# Patient Record
Sex: Male | Born: 1940 | Race: White | Hispanic: No | Marital: Single | Smoking: Former smoker
Health system: Southern US, Community
[De-identification: ages and names within clinical notes are randomized; demographics above are authoritative.]

## PROBLEM LIST (undated history)

## (undated) DIAGNOSIS — R627 Adult failure to thrive: Secondary | ICD-10-CM

## (undated) DIAGNOSIS — I2510 Atherosclerotic heart disease of native coronary artery without angina pectoris: Secondary | ICD-10-CM

## (undated) DIAGNOSIS — F32A Depression, unspecified: Secondary | ICD-10-CM

## (undated) DIAGNOSIS — I739 Peripheral vascular disease, unspecified: Secondary | ICD-10-CM

## (undated) DIAGNOSIS — N183 Chronic kidney disease, stage 3 unspecified: Secondary | ICD-10-CM

## (undated) DIAGNOSIS — M109 Gout, unspecified: Secondary | ICD-10-CM

## (undated) DIAGNOSIS — I70229 Atherosclerosis of native arteries of extremities with rest pain, unspecified extremity: Secondary | ICD-10-CM

## (undated) DIAGNOSIS — M1990 Unspecified osteoarthritis, unspecified site: Secondary | ICD-10-CM

## (undated) DIAGNOSIS — I5043 Acute on chronic combined systolic (congestive) and diastolic (congestive) heart failure: Secondary | ICD-10-CM

## (undated) DIAGNOSIS — N12 Tubulo-interstitial nephritis, not specified as acute or chronic: Secondary | ICD-10-CM

## (undated) DIAGNOSIS — I998 Other disorder of circulatory system: Secondary | ICD-10-CM

## (undated) DIAGNOSIS — E039 Hypothyroidism, unspecified: Secondary | ICD-10-CM

## (undated) DIAGNOSIS — F329 Major depressive disorder, single episode, unspecified: Secondary | ICD-10-CM

## (undated) DIAGNOSIS — Z9889 Other specified postprocedural states: Secondary | ICD-10-CM

## (undated) DIAGNOSIS — R5381 Other malaise: Secondary | ICD-10-CM

## (undated) DIAGNOSIS — D649 Anemia, unspecified: Secondary | ICD-10-CM

## (undated) DIAGNOSIS — J449 Chronic obstructive pulmonary disease, unspecified: Secondary | ICD-10-CM

## (undated) DIAGNOSIS — T8781 Dehiscence of amputation stump: Secondary | ICD-10-CM

## (undated) HISTORY — PX: KNEE CARTILAGE SURGERY: SHX688

## (undated) HISTORY — PX: REFRACTIVE SURGERY: SHX103

## (undated) HISTORY — DX: Atherosclerosis of native arteries of extremities with rest pain, unspecified extremity: I70.229

## (undated) HISTORY — PX: INGUINAL HERNIA REPAIR: SUR1180

## (undated) HISTORY — PX: KNEE LIGAMENT RECONSTRUCTION: SHX1895

## (undated) HISTORY — DX: Other disorder of circulatory system: I99.8

## (undated) HISTORY — DX: Atherosclerotic heart disease of native coronary artery without angina pectoris: I25.10

## (undated) HISTORY — PX: CATARACT EXTRACTION W/ INTRAOCULAR LENS  IMPLANT, BILATERAL: SHX1307

## (undated) HISTORY — DX: Other specified postprocedural states: Z98.890

---

## 2012-05-14 ENCOUNTER — Emergency Department (HOSPITAL_COMMUNITY): Payer: Medicare Other

## 2012-05-14 ENCOUNTER — Emergency Department (HOSPITAL_COMMUNITY)
Admission: EM | Admit: 2012-05-14 | Discharge: 2012-05-14 | Disposition: A | Discharge status: Home / Self Care | Payer: Medicare Other | Attending: Emergency Medicine | Admitting: Emergency Medicine

## 2012-05-14 ENCOUNTER — Encounter (HOSPITAL_COMMUNITY): Payer: Self-pay

## 2012-05-14 DIAGNOSIS — M25473 Effusion, unspecified ankle: Secondary | ICD-10-CM | POA: Insufficient documentation

## 2012-05-14 DIAGNOSIS — M25476 Effusion, unspecified foot: Secondary | ICD-10-CM | POA: Insufficient documentation

## 2012-05-14 DIAGNOSIS — M1712 Unilateral primary osteoarthritis, left knee: Secondary | ICD-10-CM

## 2012-05-14 DIAGNOSIS — M109 Gout, unspecified: Secondary | ICD-10-CM | POA: Insufficient documentation

## 2012-05-14 DIAGNOSIS — R350 Frequency of micturition: Secondary | ICD-10-CM | POA: Insufficient documentation

## 2012-05-14 DIAGNOSIS — F17200 Nicotine dependence, unspecified, uncomplicated: Secondary | ICD-10-CM | POA: Insufficient documentation

## 2012-05-14 DIAGNOSIS — M10061 Idiopathic gout, right knee: Secondary | ICD-10-CM

## 2012-05-14 HISTORY — DX: Gout, unspecified: M10.9

## 2012-05-14 LAB — SYNOVIAL CELL COUNT + DIFF, W/ CRYSTALS
Eosinophils-Synovial: 0 % (ref 0–1)
Neutrophil, Synovial: 97 % — ABNORMAL HIGH (ref 0–25)

## 2012-05-14 LAB — BASIC METABOLIC PANEL
BUN: 20 mg/dL (ref 6–23)
CO2: 25 mEq/L (ref 19–32)
Chloride: 98 mEq/L (ref 96–112)
Glucose, Bld: 112 mg/dL — ABNORMAL HIGH (ref 70–99)
Potassium: 4.3 mEq/L (ref 3.5–5.1)
Sodium: 135 mEq/L (ref 135–145)

## 2012-05-14 LAB — CBC WITH DIFFERENTIAL/PLATELET
Hemoglobin: 11.2 g/dL — ABNORMAL LOW (ref 13.0–17.0)
Lymphocytes Relative: 9 % — ABNORMAL LOW (ref 12–46)
Lymphs Abs: 0.7 10*3/uL (ref 0.7–4.0)
MCH: 26.6 pg (ref 26.0–34.0)
Monocytes Relative: 14 % — ABNORMAL HIGH (ref 3–12)
Neutrophils Relative %: 76 % (ref 43–77)
Platelets: 350 10*3/uL (ref 150–400)
RBC: 4.21 MIL/uL — ABNORMAL LOW (ref 4.22–5.81)
WBC: 7.3 10*3/uL (ref 4.0–10.5)

## 2012-05-14 LAB — HEPATIC FUNCTION PANEL
Albumin: 3.5 g/dL (ref 3.5–5.2)
Alkaline Phosphatase: 108 U/L (ref 39–117)
Bilirubin, Direct: 0.6 mg/dL — ABNORMAL HIGH (ref 0.0–0.3)
Total Bilirubin: 1 mg/dL (ref 0.3–1.2)

## 2012-05-14 LAB — URINALYSIS, ROUTINE W REFLEX MICROSCOPIC
Glucose, UA: NEGATIVE mg/dL
Hgb urine dipstick: NEGATIVE
Specific Gravity, Urine: 1.029 (ref 1.005–1.030)
Urobilinogen, UA: >8 mg/dL — ABNORMAL HIGH (ref 0.0–1.0)
pH: 5.5 (ref 5.0–8.0)

## 2012-05-14 LAB — URINE MICROSCOPIC-ADD ON

## 2012-05-14 MED ORDER — HYDROMORPHONE HCL PF 1 MG/ML IJ SOLN
1.0000 mg | INTRAMUSCULAR | Status: AC | PRN
  Administered 2012-05-14 (×2): 1 mg via INTRAVENOUS
  Filled 2012-05-14 (×2): qty 1

## 2012-05-14 MED ORDER — HYDROCODONE-ACETAMINOPHEN 5-325 MG PO TABS: 1.0000 | ORAL_TABLET | Freq: Four times a day (QID) | ORAL | Status: DC | PRN

## 2012-05-14 MED ORDER — OXYCODONE-ACETAMINOPHEN 5-325 MG PO TABS
1.0000 | ORAL_TABLET | Freq: Once | ORAL | Status: AC
  Administered 2012-05-14: 1 via ORAL
  Filled 2012-05-14: qty 1

## 2012-05-14 MED ORDER — INDOMETHACIN 25 MG PO CAPS: 25.0000 mg | ORAL_CAPSULE | Freq: Three times a day (TID) | ORAL | Status: DC | PRN

## 2012-05-14 NOTE — ED Provider Notes (Signed)
Apiration of blood/fluid Date/Time: 05/14/2012 7:11 PM Performed by: Jaci Carrel Authorized by: Jaci Carrel Consent: Verbal consent obtained. Risks and benefits: risks, benefits and alternatives were discussed Consent given by: patient Patient understanding: patient states understanding of the procedure being performed Patient consent: the patient's understanding of the procedure matches consent given Patient identity confirmed: verbally with patient and arm band Time out: Immediately prior to procedure a "time out" was called to verify the correct patient, procedure, equipment, support staff and site/side marked as required. Preparation: Patient was prepped and draped in the usual sterile fashion. Local anesthesia used: yes Local anesthetic: lidocaine 2% without epinephrine Anesthetic total: 2 ml Patient sedated: no Patient tolerance: Patient tolerated the procedure well with no immediate complications. Comments: ~60 cc aspirated from right knee, color- dark straw. Culture sent.    Jaci Carrel, [place] 05/14/12 1918

## 2012-05-14 NOTE — ED Provider Notes (Signed)
History     CSN: [id number] Arrival date & time 05/14/12  1351 First MD Initiated Contact with Patient 05/14/12 1609     Chief Complaint  Patient presents with  . Knee Pain  . Joint Swelling   Patient is a 71 y.o. male presenting with knee pain. The history is provided by the patient.  Knee Pain This is a recurrent problem. The current episode started more than 2 days ago. The problem occurs constantly. The problem has been gradually worsening. Pertinent negatives include no chest pain, no abdominal pain, no headaches and no shortness of breath. Exacerbated by: acitivity. Nothing relieves the symptoms. Treatments tried: pt took a couple of his gout pills but it did not help. The treatment provided no relief.  He has history of gout but it has not affected his knees before.  Pt states he has not been able to walk now because of the pain.  He has not had any swelling.  No fever.  No injury.  He does have a history of PVD and continues to smoke but the pain is focused in his knees. Patient denies any trouble with chest pain or shortness of breath. He has not noticed any rashes.  Past Medical History  Diagnosis Date  . Gout     Past Surgical History  Procedure Date  . Knee surgery     Family History  Problem Relation Age of Onset  . Heart failure Father   . Heart failure Brother     History  Substance Use Topics  . Smoking status: Current Every Day Smoker -- 1.0 packs/day    Types: Cigarettes  . Smokeless tobacco: Never Used  . Alcohol Use: Yes     8- 12 ounce beers daily      Review of Systems  Constitutional: Negative for fever and fatigue.  Respiratory: Negative for cough and shortness of breath.   Cardiovascular: Negative for chest pain.  Gastrointestinal: Negative for abdominal pain.  Genitourinary: Positive for urgency.       Dark color to urine   Musculoskeletal: Positive for joint swelling.  Skin: Negative for rash.  Neurological: Negative for headaches.  All  other systems reviewed and are negative.    Allergies  Review of patient's allergies indicates no known allergies.  Home Medications  No current outpatient prescriptions on file.  BP 111/95  Pulse 95  Temp 97.7 F (36.5 C) (Oral)  Resp 18  SpO2 98%  Physical Exam  Nursing note and vitals reviewed. Constitutional: He appears well-developed and well-nourished. No distress.  HENT:  Head: Normocephalic and atraumatic.  Right Ear: External ear normal.  Left Ear: External ear normal.  Eyes: Conjunctivae normal are normal. Right eye exhibits no discharge. Left eye exhibits no discharge. No scleral icterus.  Neck: Neck supple. No tracheal deviation present.  Cardiovascular: Normal rate, regular rhythm and intact distal pulses.   Pulmonary/Chest: Effort normal and breath sounds normal. No stridor. No respiratory distress. He has no wheezes. He has no rales.  Abdominal: Soft. Bowel sounds are normal. He exhibits no distension. There is no tenderness. There is no rebound and no guarding.  Musculoskeletal: He exhibits tenderness. He exhibits no edema.       Right knee: He exhibits swelling and effusion. tenderness found.       Left knee: He exhibits swelling. tenderness found.       Right ankle: no tenderness.       Left ankle: no tenderness.  Right lower leg: He exhibits no tenderness.       Left lower leg: He exhibits no tenderness.       No erythema of his lower extremities, palpable distal pulses  Neurological: He is alert. He has normal strength. No sensory deficit. Cranial nerve deficit:  no gross defecits noted. He exhibits normal muscle tone. He displays no seizure activity. Coordination normal.  Skin: Skin is warm and dry. No rash noted.  Psychiatric: He has a normal mood and affect.    ED Course  Procedures (including critical care time)  Labs Reviewed  CBC WITH DIFFERENTIAL - Abnormal; Notable for the following:    RBC 4.21 (*)     Hemoglobin 11.2 (*)     HCT 33.6  (*)     RDW 16.6 (*)     Lymphocytes Relative 9 (*)     Monocytes Relative 14 (*)     All other components within normal limits  BASIC METABOLIC PANEL - Abnormal; Notable for the following:    Glucose, Bld 112 (*)     GFR calc non Af Amer 82 (*)     All other components within normal limits  URINALYSIS, ROUTINE W REFLEX MICROSCOPIC - Abnormal; Notable for the following:    Color, Urine ORANGE (*)  BIOCHEMICALS MAY BE AFFECTED BY COLOR   Bilirubin Urine MODERATE (*)     Ketones, ur TRACE (*)     Protein, ur 30 (*)     Urobilinogen, UA >8.0 (*)     Nitrite POSITIVE (*)     Leukocytes, UA SMALL (*)     All other components within normal limits  CELL COUNT + DIFF,  W/ CRYST-SYNVL FLD - Abnormal; Notable for the following:    Color, Synovial ORANGE (*)     Appearance-Synovial TURBID (*)     WBC, Synovial 16109 (*)     Neutrophil, Synovial 97 (*)     Monocyte-Macrophage-Synovial Fluid 2 (*)     All other components within normal limits  HEPATIC FUNCTION PANEL - Abnormal; Notable for the following:    Bilirubin, Direct 0.6 (*)     All other components within normal limits  URINE MICROSCOPIC-ADD ON  BODY FLUID CULTURE   Dg Knee 2 Views Left  05/14/2012  *RADIOLOGY REPORT*  Clinical Data: Bilateral knee pain for 4 days.  No known injury.  LEFT KNEE - 1-2 VIEW  Comparison: None.  Findings: There is mild osteopenia.  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  There are probable loose bodies posteriorly.  There is a moderate sized joint effusion.  No acute fracture or dislocation is seen.  Scattered vascular calcifications are noted.  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  No acute osseous findings identified.   Original Report Authenticated By: Gerrianne Scale, M.D.    Dg Knee 2 Views Right  05/14/2012  *RADIOLOGY REPORT*  Clinical Data: Bilateral knee pain for 4  days.  No known injury.  RIGHT KNEE - 1-2 VIEW  Comparison: None.  Findings: The joint spaces appear adequately maintained within the right knee.  There is a moderate sized knee joint effusion.  No acute fracture or dislocation is seen.  There are scattered vascular calcifications.  IMPRESSION: No significant right knee osteoarthritis.  Moderate sized knee joint effusion.   Original Report Authenticated By: Gerrianne Scale, M.D.      MDM  ED course: Patient was provided pain  medications. Joint aspiration was performed on his right knee for symptom improvement and analysis of the synovial fluid. Findings were discussed with patient as well as his family member  Medical decision-making: The patient's knee problems associated with gouty arthritis as well as osteoarthritis. We'll discharge him home on medications for pain and inflammation. Unfortunately there is no indication for admission to the hospital at this time. Regarding his dark urine this appears to be related to bilirubin in the urine. There doesn't appear to be evidence of infection.  I did explain to the patient that his alcohol use is likely related to this he should cut back on history taking. Recommend he followup with a primary care doctor. I will provide him with outpatient resources         Celene Kras, MD 05/14/12 2123

## 2012-05-14 NOTE — ED Provider Notes (Signed)
Medical screening examination/treatment/procedure(s) were conducted as a shared visit with non-physician practitioner(s) and myself.  I personally evaluated the patient during the encounter   Celene Kras, MD 05/14/12 1944

## 2012-05-14 NOTE — ED Notes (Signed)
Patient reports that he began having bilateral knee and feet swelling x 5 days, Patient has a history of gout.

## 2012-05-18 LAB — BODY FLUID CULTURE: Special Requests: NORMAL

## 2014-02-25 ENCOUNTER — Other Ambulatory Visit (HOSPITAL_COMMUNITY): Payer: Self-pay | Admitting: Orthopedic Surgery

## 2014-02-28 ENCOUNTER — Encounter (HOSPITAL_COMMUNITY): Payer: Self-pay | Admitting: *Deleted

## 2014-02-28 MED ORDER — CEFAZOLIN SODIUM-DEXTROSE 2-3 GM-% IV SOLR
2.0000 g | INTRAVENOUS | Status: AC
  Administered 2014-03-01: 2 g via INTRAVENOUS
  Filled 2014-02-28: qty 50

## 2014-03-01 ENCOUNTER — Ambulatory Visit (HOSPITAL_COMMUNITY): Payer: Medicare PPO

## 2014-03-01 ENCOUNTER — Ambulatory Visit (HOSPITAL_COMMUNITY): Payer: Medicare PPO | Admitting: Anesthesiology

## 2014-03-01 ENCOUNTER — Encounter (HOSPITAL_COMMUNITY): Payer: Medicare PPO | Admitting: Anesthesiology

## 2014-03-01 ENCOUNTER — Ambulatory Visit (HOSPITAL_COMMUNITY)
Admission: RE | Admit: 2014-03-01 | Discharge: 2014-03-01 | Disposition: A | Discharge status: Home / Self Care | Payer: Medicare PPO | Source: Ambulatory Visit | Attending: Orthopedic Surgery | Admitting: Orthopedic Surgery

## 2014-03-01 ENCOUNTER — Encounter (HOSPITAL_COMMUNITY)
Admission: RE | Disposition: A | Discharge status: Home / Self Care | Payer: Self-pay | Source: Ambulatory Visit | Attending: Orthopedic Surgery

## 2014-03-01 ENCOUNTER — Encounter (HOSPITAL_COMMUNITY): Payer: Self-pay | Admitting: *Deleted

## 2014-03-01 DIAGNOSIS — L97509 Non-pressure chronic ulcer of other part of unspecified foot with unspecified severity: Secondary | ICD-10-CM | POA: Diagnosis not present

## 2014-03-01 DIAGNOSIS — F17200 Nicotine dependence, unspecified, uncomplicated: Secondary | ICD-10-CM | POA: Diagnosis not present

## 2014-03-01 DIAGNOSIS — M869 Osteomyelitis, unspecified: Secondary | ICD-10-CM | POA: Diagnosis present

## 2014-03-01 DIAGNOSIS — D649 Anemia, unspecified: Secondary | ICD-10-CM | POA: Insufficient documentation

## 2014-03-01 DIAGNOSIS — B351 Tinea unguium: Secondary | ICD-10-CM | POA: Diagnosis not present

## 2014-03-01 HISTORY — DX: Chronic obstructive pulmonary disease, unspecified: J44.9

## 2014-03-01 HISTORY — PX: AMPUTATION: SHX166

## 2014-03-01 LAB — PROTIME-INR
INR: 1.21 (ref 0.00–1.49)
Prothrombin Time: 15.3 seconds — ABNORMAL HIGH (ref 11.6–15.2)

## 2014-03-01 LAB — COMPREHENSIVE METABOLIC PANEL
ALK PHOS: 68 U/L (ref 39–117)
ALT: <5 U/L (ref 0–53)
ANION GAP: 12 (ref 5–15)
AST: 10 U/L (ref 0–37)
Albumin: 3.4 g/dL — ABNORMAL LOW (ref 3.5–5.2)
BUN: 11 mg/dL (ref 6–23)
CALCIUM: 9.2 mg/dL (ref 8.4–10.5)
CO2: 24 mEq/L (ref 19–32)
Chloride: 103 mEq/L (ref 96–112)
Creatinine, Ser: 0.98 mg/dL (ref 0.50–1.35)
GFR calc Af Amer: >90 mL/min (ref >90)
GFR calc non Af Amer: 81 mL/min — ABNORMAL LOW (ref >90)
Glucose, Bld: 92 mg/dL (ref 70–99)
Potassium: 4.3 mEq/L (ref 3.7–5.3)
SODIUM: 139 meq/L (ref 137–147)
TOTAL PROTEIN: 6.3 g/dL (ref 6.0–8.3)
Total Bilirubin: 0.5 mg/dL (ref 0.3–1.2)

## 2014-03-01 LAB — CBC
HEMATOCRIT: 33 % — AB (ref 39.0–52.0)
Hemoglobin: 10.2 g/dL — ABNORMAL LOW (ref 13.0–17.0)
MCH: 22.6 pg — ABNORMAL LOW (ref 26.0–34.0)
MCHC: 30.9 g/dL (ref 30.0–36.0)
MCV: 73.2 fL — ABNORMAL LOW (ref 78.0–100.0)
Platelets: 231 10*3/uL (ref 150–400)
RBC: 4.51 MIL/uL (ref 4.22–5.81)
RDW: 19.2 % — AB (ref 11.5–15.5)
WBC: 5 10*3/uL (ref 4.0–10.5)

## 2014-03-01 LAB — APTT: aPTT: 43 seconds — ABNORMAL HIGH (ref 24–37)

## 2014-03-01 SURGERY — AMPUTATION, FOOT, RAY
Anesthesia: General | Site: Foot | Laterality: Right

## 2014-03-01 MED ORDER — PROPOFOL 10 MG/ML IV BOLUS
INTRAVENOUS | Status: AC
  Filled 2014-03-01: qty 20

## 2014-03-01 MED ORDER — PROMETHAZINE HCL 25 MG/ML IJ SOLN: 6.2500 mg | INTRAMUSCULAR | Status: DC | PRN

## 2014-03-01 MED ORDER — OXYCODONE HCL 5 MG/5ML PO SOLN: 5.0000 mg | Freq: Once | ORAL | Status: AC | PRN

## 2014-03-01 MED ORDER — FENTANYL CITRATE 0.05 MG/ML IJ SOLN
25.0000 ug | INTRAMUSCULAR | Status: DC | PRN
  Administered 2014-03-01 (×2): 50 ug via INTRAVENOUS

## 2014-03-01 MED ORDER — FENTANYL CITRATE 0.05 MG/ML IJ SOLN
INTRAMUSCULAR | Status: AC
  Filled 2014-03-01: qty 5

## 2014-03-01 MED ORDER — PROPOFOL 10 MG/ML IV BOLUS
INTRAVENOUS | Status: DC | PRN
  Administered 2014-03-01: 160 mg via INTRAVENOUS

## 2014-03-01 MED ORDER — LACTATED RINGERS IV SOLN
INTRAVENOUS | Status: DC | PRN
  Administered 2014-03-01: 12:00:00 via INTRAVENOUS

## 2014-03-01 MED ORDER — LACTATED RINGERS IV SOLN
INTRAVENOUS | Status: DC
  Administered 2014-03-01: 12:00:00 via INTRAVENOUS

## 2014-03-01 MED ORDER — OXYCODONE HCL 5 MG PO TABS
5.0000 mg | ORAL_TABLET | Freq: Once | ORAL | Status: AC | PRN
  Administered 2014-03-01: 5 mg via ORAL

## 2014-03-01 MED ORDER — FENTANYL CITRATE 0.05 MG/ML IJ SOLN
INTRAMUSCULAR | Status: DC | PRN
  Administered 2014-03-01: 100 ug via INTRAVENOUS

## 2014-03-01 MED ORDER — HYDROCODONE-ACETAMINOPHEN 5-325 MG PO TABS: 1.0000 | ORAL_TABLET | Freq: Four times a day (QID) | ORAL | Status: DC | PRN

## 2014-03-01 MED ORDER — 0.9 % SODIUM CHLORIDE (POUR BTL) OPTIME
TOPICAL | Status: DC | PRN
  Administered 2014-03-01: 1000 mL

## 2014-03-01 MED ORDER — HYDROCODONE-ACETAMINOPHEN 5-325 MG PO TABS
ORAL_TABLET | ORAL | Status: AC
  Filled 2014-03-01: qty 1

## 2014-03-01 MED ORDER — LIDOCAINE HCL (CARDIAC) 20 MG/ML IV SOLN
INTRAVENOUS | Status: DC | PRN
  Administered 2014-03-01: 75 mg via INTRAVENOUS

## 2014-03-01 MED ORDER — LIDOCAINE HCL (CARDIAC) 20 MG/ML IV SOLN
INTRAVENOUS | Status: AC
  Filled 2014-03-01: qty 5

## 2014-03-01 MED ORDER — FENTANYL CITRATE 0.05 MG/ML IJ SOLN
INTRAMUSCULAR | Status: AC
  Filled 2014-03-01: qty 2

## 2014-03-01 MED ORDER — HYDROCODONE-ACETAMINOPHEN 5-325 MG PO TABS
1.0000 | ORAL_TABLET | Freq: Once | ORAL | Status: AC
  Administered 2014-03-01: 1 via ORAL

## 2014-03-01 MED ORDER — OXYCODONE HCL 5 MG PO TABS
ORAL_TABLET | ORAL | Status: AC
  Filled 2014-03-01: qty 1

## 2014-03-01 SURGICAL SUPPLY — 37 items
BLADE SAW SGTL MED 73X18.5 STR (BLADE) IMPLANT
BNDG COHESIVE 4X5 TAN STRL (GAUZE/BANDAGES/DRESSINGS) ×3 IMPLANT
BNDG GAUZE ELAST 4 BULKY (GAUZE/BANDAGES/DRESSINGS) ×3 IMPLANT
COVER SURGICAL LIGHT HANDLE (MISCELLANEOUS) ×3 IMPLANT
DRAPE U-SHAPE 47X51 STRL (DRAPES) ×6 IMPLANT
DRSG ADAPTIC 3X8 NADH LF (GAUZE/BANDAGES/DRESSINGS) ×3 IMPLANT
DRSG PAD ABDOMINAL 8X10 ST (GAUZE/BANDAGES/DRESSINGS) ×6 IMPLANT
DURAPREP 26ML APPLICATOR (WOUND CARE) ×3 IMPLANT
ELECT REM PT RETURN 9FT ADLT (ELECTROSURGICAL) ×3
ELECTRODE REM PT RTRN 9FT ADLT (ELECTROSURGICAL) ×1 IMPLANT
GAUZE SPONGE 4X4 12PLY STRL (GAUZE/BANDAGES/DRESSINGS) ×3 IMPLANT
GLOVE BIO SURGEON STRL SZ 6 (GLOVE) ×3 IMPLANT
GLOVE BIO SURGEON STRL SZ 6.5 (GLOVE) ×2 IMPLANT
GLOVE BIO SURGEONS STRL SZ 6.5 (GLOVE) ×1
GLOVE BIOGEL PI IND STRL 6.5 (GLOVE) ×3 IMPLANT
GLOVE BIOGEL PI IND STRL 9 (GLOVE) ×1 IMPLANT
GLOVE BIOGEL PI INDICATOR 6.5 (GLOVE) ×6
GLOVE BIOGEL PI INDICATOR 9 (GLOVE) ×2
GLOVE SKINSENSE NS SZ7.0 (GLOVE) ×2
GLOVE SKINSENSE STRL SZ7.0 (GLOVE) ×1 IMPLANT
GLOVE SURG ORTHO 9.0 STRL STRW (GLOVE) ×3 IMPLANT
GOWN STRL REUS W/ TWL XL LVL3 (GOWN DISPOSABLE) ×3 IMPLANT
GOWN STRL REUS W/TWL XL LVL3 (GOWN DISPOSABLE) ×6
KIT BASIN OR (CUSTOM PROCEDURE TRAY) ×3 IMPLANT
KIT ROOM TURNOVER OR (KITS) ×3 IMPLANT
NS IRRIG 1000ML POUR BTL (IV SOLUTION) ×3 IMPLANT
PACK ORTHO EXTREMITY (CUSTOM PROCEDURE TRAY) ×3 IMPLANT
PAD ABD 8X10 STRL (GAUZE/BANDAGES/DRESSINGS) ×3 IMPLANT
PAD ARMBOARD 7.5X6 YLW CONV (MISCELLANEOUS) ×6 IMPLANT
SPONGE GAUZE 4X4 12PLY STER LF (GAUZE/BANDAGES/DRESSINGS) ×3 IMPLANT
SPONGE LAP 18X18 X RAY DECT (DISPOSABLE) ×3 IMPLANT
STOCKINETTE IMPERVIOUS LG (DRAPES) ×3 IMPLANT
SUT ETHILON 2 0 PSLX (SUTURE) ×6 IMPLANT
TOWEL OR 17X24 6PK STRL BLUE (TOWEL DISPOSABLE) ×3 IMPLANT
TOWEL OR 17X26 10 PK STRL BLUE (TOWEL DISPOSABLE) ×3 IMPLANT
UNDERPAD 30X30 INCONTINENT (UNDERPADS AND DIAPERS) ×3 IMPLANT
WATER STERILE IRR 1000ML POUR (IV SOLUTION) ×3 IMPLANT

## 2014-03-01 NOTE — Anesthesia Procedure Notes (Signed)
Procedure Name: LMA Insertion Date/Time: 03/01/2014 12:49 PM Performed by: Gwenyth AllegraADAMI, Milon Dethloff Pre-anesthesia Checklist: Patient identified, Patient being monitored, Emergency Drugs available and Timeout performed Patient Re-evaluated:Patient Re-evaluated prior to inductionPreoxygenation: Pre-oxygenation with 100% oxygen Intubation Type: IV induction Ventilation: Mask ventilation without difficulty LMA: LMA inserted LMA Size: 5.0 Number of attempts: 1 Placement Confirmation: positive ETCO2 and breath sounds checked- equal and bilateral Tube secured with: Tape Dental Injury: Teeth and Oropharynx as per pre-operative assessment

## 2014-03-01 NOTE — Anesthesia Preprocedure Evaluation (Addendum)
Anesthesia Evaluation  Patient identified by MRN, date of birth, ID band  Reviewed: Allergy & Precautions, H&P , NPO status , Patient's Chart, lab work & pertinent test results  Airway Mallampati: II  Neck ROM: Full    Dental  (+) Missing, Poor Dentition   Pulmonary Current Smoker,  breath sounds clear to auscultation        Cardiovascular + Peripheral Vascular Disease Rhythm:Regular Rate:Normal     Neuro/Psych negative neurological ROS     GI/Hepatic negative GI ROS, Neg liver ROS,   Endo/Other  negative endocrine ROS  Renal/GU      Musculoskeletal   Abdominal   Peds  Hematology  (+) anemia ,   Anesthesia Other Findings   Reproductive/Obstetrics                          Anesthesia Physical Anesthesia Plan  ASA: II  Anesthesia Plan: General   Post-op Pain Management:    Induction: Intravenous  Airway Management Planned: LMA  Additional Equipment:   Intra-op Plan:   Post-operative Plan: Extubation in OR  Informed Consent: I have reviewed the patients History and Physical, chart, labs and discussed the procedure including the risks, benefits and alternatives for the proposed anesthesia with the patient or authorized representative who has indicated his/her understanding and acceptance.   Dental advisory given  Plan Discussed with: CRNA and Surgeon  Anesthesia Plan Comments:         Anesthesia Quick Evaluation

## 2014-03-01 NOTE — H&P (Signed)
Caleb Hays is an 73 y.o. male.   Chief Complaint: Osteomyelitis ulceration right foot fifth metatarsal head HPI: Patient is a 73 year old gentleman who presents after failure of conservative treatment for ulceration to the fifth metatarsal head right foot. 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  Past Medical History  Diagnosis Date  . Gout     Past Surgical History  Procedure Laterality Date  . Knee surgery Left     football injury    Family History  Problem Relation Age of Onset  . Heart failure Father   . Heart failure Brother    Social History:  reports that he has been smoking Cigarettes.  He has a 12.5 pack-year smoking history. He has never used smokeless tobacco. He reports that he does not drink alcohol or use illicit drugs.  Allergies: No Known Allergies  No prescriptions prior to admission    No results found for this or any previous visit (from the past 48 hour(s)). No results found.  Review of Systems  All other systems reviewed and are negative.   Height 6\' 4"  (1.93 m), weight 83.462 kg (184 lb). Physical Exam  On examination patient has a palpable dorsalis pedis pulse. He has ulceration of the fifth metatarsal head right foot. Radiograph shows destructive bony changes of the fifth metatarsal head. Assessment/Plan Assessment: Osteomyelitis ulceration right foot fifth metatarsal head.  Plan: Will plan for fifth ray amputation. Risks and benefits were discussed including risk of no healing of the wound. Patient states he understands and wished to proceed at this time.  DUDA,MARCUS V 03/01/2014, 6:14 AM

## 2014-03-01 NOTE — Transfer of Care (Signed)
Immediate Anesthesia Transfer of Care Note  Patient: Caleb Hays  Procedure(s) Performed: Procedure(s) with comments: AMPUTATION RAY (Right) - Right Foot 5th Ray Amputation  Patient Location: PACU  Anesthesia Type:General  Level of Consciousness: awake, alert  and oriented  Airway & Oxygen Therapy: Patient Spontanous Breathing and Patient connected to nasal cannula oxygen  Post-op Assessment: Report given to PACU RN and Post -op Vital signs reviewed and stable  Post vital signs: Reviewed and stable  Complications: No apparent anesthesia complications

## 2014-03-01 NOTE — Op Note (Signed)
03/01/2014  1:07 PM  PATIENT:  [name]    PRE-OPERATIVE DIAGNOSIS:  Osteomyelitis Right 5th Toe  POST-OPERATIVE DIAGNOSIS:  Same  PROCEDURE:  AMPUTATION RAY  SURGEON:  Alim Cattell V, MD  PHYSICIAN ASSISTANT:None ANESTHESIA:   General  PREOPERATIVE INDICATIONS:  Caleb SavageJerald Hays is a  73 y.o. male with a diagnosis of Osteomyelitis Right 5th Toe who failed conservative measures and elected for surgical management.    The risks benefits and alternatives were discussed with the patient preoperatively including but not limited to the risks of infection, bleeding, nerve injury, cardiopulmonary complications, the need for revision surgery, among others, and the patient was willing to proceed.  OPERATIVE IMPLANTS: none  OPERATIVE FINDINGS: Good petechial bleeding  OPERATIVE PROCEDURE: Patient was brought to the operating room and underwent a general anesthetic. After adequate levels of anesthesia were obtained patient's right lower extremity was prepped using DuraPrep draped into a sterile field. A racquet incision was made around the ulcerative toe and fifth ray. The fifth metatarsal was resected through its mid shaft. The wound was irrigated with normal saline. The skin was closed using 2-0 nylon. Patient thickened discolored onychomycotic nails x5 and these were trimmed x5 without difficulty. A sterile compressive dressing was applied patient was extubated taken to the PACU in stable condition.

## 2014-03-01 NOTE — Progress Notes (Signed)
Orthopedic Tech Progress Note Patient Details:  Caleb SavageJerald Hays 07-22-1940 [id number]  Ortho Devices Type of Ortho Device: Postop shoe/boot Ortho Device/Splint Location: RLE Ortho Device/Splint Interventions: Ordered;Application   [name], [name] 03/01/2014, 1:46 PM

## 2014-03-04 ENCOUNTER — Telehealth: Payer: Self-pay | Admitting: Cardiovascular Disease

## 2014-03-04 ENCOUNTER — Encounter (HOSPITAL_COMMUNITY): Payer: Self-pay | Admitting: Orthopedic Surgery

## 2014-03-05 NOTE — Anesthesia Postprocedure Evaluation (Signed)
  Anesthesia Post-op Note  Patient: [name]  Procedure(s) Performed: Procedure(s) with comments: AMPUTATION RAY (Right) - Right Foot 5th Ray Amputation  Patient Location: PACU  Anesthesia Type:General  Level of Consciousness: awake and alert   Airway and Oxygen Therapy: Patient Spontanous Breathing  Post-op Pain: none  Post-op Assessment: Post-op Vital signs reviewed  Post-op Vital Signs: stable  Last Vitals:  Filed Vitals:   03/01/14 1407  BP: 144/63  Pulse: 62  Temp: 36.7 C  Resp: 16    Complications: No apparent anesthesia complications

## 2014-03-08 NOTE — Telephone Encounter (Signed)
Closed encounter °

## 2014-03-12 ENCOUNTER — Ambulatory Visit (INDEPENDENT_AMBULATORY_CARE_PROVIDER_SITE_OTHER): Payer: Medicare PPO | Admitting: Cardiovascular Disease

## 2014-03-12 ENCOUNTER — Encounter: Payer: Self-pay | Admitting: Cardiovascular Disease

## 2014-03-12 VITALS — BP 140/72 | HR 72 | Ht 76.0 in | Wt 177.3 lb

## 2014-03-12 DIAGNOSIS — S98139A Complete traumatic amputation of one unspecified lesser toe, initial encounter: Secondary | ICD-10-CM

## 2014-03-12 DIAGNOSIS — S98131A Complete traumatic amputation of one right lesser toe, initial encounter: Secondary | ICD-10-CM

## 2014-03-12 DIAGNOSIS — Z9889 Other specified postprocedural states: Secondary | ICD-10-CM

## 2014-03-12 NOTE — Assessment & Plan Note (Signed)
The patient had right fifth toe amputated by Dr. Lajoyce Corners a week ago Friday for what sounds like osteomyelitis. His vascular risk factors are notable for 60 pack years having quit 2 weeks ago but otherwise is negative. He really denied claudication prior to this. He has diminished pedal pulses bilaterally. He has a fresh wound in the lateral aspect of his right foot that appears to be healing well. I'm going to obtain arterial Doppler studies.

## 2014-03-12 NOTE — Progress Notes (Signed)
     03/12/2014 Caleb Hays   [birth year]  [id number]  Primary Physician Millsaps, Joelene Millin, NP Primary Cardiologist: Runell Gess MD Roseanne Reno   HPI:  Mr. Caleb Hays is a 73 year old thin appearing single Caucasian male with no children from behind his knees today. He was referred by Tally Joe for peripheral vasodilation. He is retired Corporate investment banker. His crit was factors are notable for 60 pack years having quit 2 weeks ago. He also quit taking alcohol 2 years ago at which time he was drinking 6-8 beers a day. He has never had a heart attack or stroke, denies chest pain shortness of breath or claudication. He had a gangrenous right fifth toe with osteomyelitis and underwent amputation one week ago by Dr. Lajoyce Corners .   Current Outpatient Prescriptions  Medication Sig Dispense Refill  . allopurinol (ZYLOPRIM) 300 MG tablet Take 300 mg by mouth daily.       Marland Kitchen HYDROcodone-acetaminophen (NORCO) 5-325 MG per tablet Take 1-2 tablets by mouth every 6 (six) hours as needed for pain.  16 tablet  0   No current facility-administered medications for this visit.    No Known Allergies  History   Social History  . Marital Status: Single    Spouse Name: N/A    Number of Children: N/A  . Years of Education: N/A   Occupational History  . Not on file.   Social History Main Topics  . Smoking status: Former Smoker -- 0.25 packs/day for 50 years    Types: Cigarettes    Quit date: 02/26/2014  . Smokeless tobacco: Never Used  . Alcohol Use: No     Comment: quit drinking around 2013  . Drug Use: No  . Sexual Activity: Not on file   Other Topics Concern  . Not on file   Social History Narrative  . No narrative on file     Review of Systems: General: negative for chills, fever, night sweats or weight changes.  Cardiovascular: negative for chest pain, dyspnea on exertion, edema, orthopnea, palpitations, paroxysmal nocturnal dyspnea or shortness of breath Dermatological:  negative for rash Respiratory: negative for cough or wheezing Urologic: negative for hematuria Abdominal: negative for nausea, vomiting, diarrhea, bright red blood per rectum, melena, or hematemesis Neurologic: negative for visual changes, syncope, or dizziness All other systems reviewed and are otherwise negative except as noted above.    Blood pressure 140/72, pulse 72, height  (1.93 m), weight 177 lb 4.8 oz (80.423 kg).  General appearance: alert and no distress Neck: no adenopathy, no carotid bruit, no JVD, supple, symmetrical, trachea midline and thyroid not enlarged, symmetric, no tenderness/mass/nodules Lungs: clear to auscultation bilaterally Heart: regular rate and rhythm, S1, S2 normal, no murmur, click, rub or gallop Extremities: extremities normal, atraumatic, no cyanosis or edema and absent pedal pulses, healing right fifth ray amputation  EKG not performed today  ASSESSMENT AND PLAN:   Status post right foot surgery The patient had right fifth toe amputated by Dr. Lajoyce Corners a week ago Friday for what sounds like osteomyelitis. His vascular risk factors are notable for 60 pack years having quit 2 weeks ago but otherwise is negative. He really denied claudication prior to this. He has diminished pedal pulses bilaterally. He has a fresh wound in the lateral aspect of his right foot that appears to be healing well. I'm going to obtain arterial Doppler studies.      Runell Gess MD FACP,FACC,FAHA, [facility] 03/12/2014 4:13 PM

## 2014-03-12 NOTE — Patient Instructions (Signed)
  We will see you back in follow up in 2 months with Dr Allyson Sabal.   Dr Allyson Sabal has ordered: lower extremity arterial doppler- During this test, ultrasound is used to evaluate arterial blood flow in the legs. Allow approximately one hour for this exam.

## 2014-03-20 ENCOUNTER — Ambulatory Visit (HOSPITAL_COMMUNITY)
Admission: RE | Admit: 2014-03-20 | Discharge: 2014-03-20 | Disposition: A | Discharge status: Home / Self Care | Payer: Medicare PPO | Source: Ambulatory Visit | Attending: Cardiovascular Disease | Admitting: Cardiovascular Disease

## 2014-03-20 DIAGNOSIS — S98139A Complete traumatic amputation of one unspecified lesser toe, initial encounter: Secondary | ICD-10-CM | POA: Diagnosis not present

## 2014-03-20 DIAGNOSIS — I739 Peripheral vascular disease, unspecified: Secondary | ICD-10-CM

## 2014-03-20 DIAGNOSIS — S98131A Complete traumatic amputation of one right lesser toe, initial encounter: Secondary | ICD-10-CM

## 2014-03-20 DIAGNOSIS — I70219 Atherosclerosis of native arteries of extremities with intermittent claudication, unspecified extremity: Secondary | ICD-10-CM | POA: Diagnosis present

## 2014-03-20 NOTE — Progress Notes (Signed)
Arterial Duplex Lower Ext. Completed. Journee Bobrowski, BS, RDMS, RVT  

## 2014-04-22 ENCOUNTER — Other Ambulatory Visit (HOSPITAL_COMMUNITY): Payer: Self-pay | Admitting: Orthopedic Surgery

## 2014-04-24 ENCOUNTER — Encounter (HOSPITAL_COMMUNITY): Payer: Self-pay | Admitting: Pharmacy Technician

## 2014-04-25 ENCOUNTER — Encounter (HOSPITAL_COMMUNITY): Payer: Self-pay | Admitting: *Deleted

## 2014-04-25 ENCOUNTER — Other Ambulatory Visit: Payer: Self-pay | Admitting: Orthopedic Surgery

## 2014-04-25 MED ORDER — CEFAZOLIN SODIUM-DEXTROSE 2-3 GM-% IV SOLR
2.0000 g | INTRAVENOUS | Status: AC
  Administered 2014-04-26: 2 g via INTRAVENOUS
  Filled 2014-04-25: qty 50

## 2014-04-25 NOTE — Progress Notes (Signed)
Spoke with Caleb Hays, CMA at Dr. Audrie Liauda's office to clarify order for consent. [facility] to [facility] C[facility], the pt is having the right foot fourth ray amputated; will make MD aware to discontinue previous order dated 02/25/14.

## 2014-04-25 NOTE — Progress Notes (Signed)
Pt verified DOB and procedure before giving verbal consent for [name] ( niece) to complete SDW -pre-op call.

## 2014-04-26 ENCOUNTER — Encounter (HOSPITAL_COMMUNITY): Payer: Medicare PPO | Admitting: Certified Registered"

## 2014-04-26 ENCOUNTER — Encounter (HOSPITAL_COMMUNITY): Payer: Self-pay | Admitting: *Deleted

## 2014-04-26 ENCOUNTER — Encounter (HOSPITAL_COMMUNITY)
Admission: RE | Disposition: A | Discharge status: Home / Self Care | Payer: Self-pay | Source: Ambulatory Visit | Attending: Orthopedic Surgery

## 2014-04-26 ENCOUNTER — Ambulatory Visit (HOSPITAL_COMMUNITY): Payer: Medicare PPO | Admitting: Certified Registered"

## 2014-04-26 ENCOUNTER — Ambulatory Visit (HOSPITAL_COMMUNITY)
Admission: RE | Admit: 2014-04-26 | Discharge: 2014-04-26 | Disposition: A | Discharge status: Home / Self Care | Payer: Medicare PPO | Source: Ambulatory Visit | Attending: Orthopedic Surgery | Admitting: Orthopedic Surgery

## 2014-04-26 DIAGNOSIS — M869 Osteomyelitis, unspecified: Secondary | ICD-10-CM | POA: Diagnosis present

## 2014-04-26 DIAGNOSIS — L97519 Non-pressure chronic ulcer of other part of right foot with unspecified severity: Secondary | ICD-10-CM | POA: Diagnosis not present

## 2014-04-26 DIAGNOSIS — I739 Peripheral vascular disease, unspecified: Secondary | ICD-10-CM | POA: Insufficient documentation

## 2014-04-26 DIAGNOSIS — J449 Chronic obstructive pulmonary disease, unspecified: Secondary | ICD-10-CM | POA: Insufficient documentation

## 2014-04-26 DIAGNOSIS — M109 Gout, unspecified: Secondary | ICD-10-CM | POA: Insufficient documentation

## 2014-04-26 DIAGNOSIS — F17210 Nicotine dependence, cigarettes, uncomplicated: Secondary | ICD-10-CM | POA: Insufficient documentation

## 2014-04-26 DIAGNOSIS — M86171 Other acute osteomyelitis, right ankle and foot: Secondary | ICD-10-CM

## 2014-04-26 HISTORY — PX: AMPUTATION: SHX166

## 2014-04-26 LAB — CBC
HEMATOCRIT: 33.8 % — AB (ref 39.0–52.0)
Hemoglobin: 10.7 g/dL — ABNORMAL LOW (ref 13.0–17.0)
MCH: 22.5 pg — ABNORMAL LOW (ref 26.0–34.0)
MCHC: 31.7 g/dL (ref 30.0–36.0)
MCV: 71 fL — AB (ref 78.0–100.0)
PLATELETS: 234 10*3/uL (ref 150–400)
RBC: 4.76 MIL/uL (ref 4.22–5.81)
RDW: 17.4 % — AB (ref 11.5–15.5)
WBC: 4.8 10*3/uL (ref 4.0–10.5)

## 2014-04-26 LAB — COMPREHENSIVE METABOLIC PANEL
ALBUMIN: 3.6 g/dL (ref 3.5–5.2)
ALT: <5 U/L (ref 0–53)
AST: 8 U/L (ref 0–37)
Alkaline Phosphatase: 66 U/L (ref 39–117)
Anion gap: 13 (ref 5–15)
BILIRUBIN TOTAL: 0.4 mg/dL (ref 0.3–1.2)
BUN: 18 mg/dL (ref 6–23)
CHLORIDE: 101 meq/L (ref 96–112)
CO2: 24 mEq/L (ref 19–32)
Calcium: 9.2 mg/dL (ref 8.4–10.5)
Creatinine, Ser: 1.06 mg/dL (ref 0.50–1.35)
GFR calc Af Amer: 79 mL/min — ABNORMAL LOW (ref >90)
GFR calc non Af Amer: 68 mL/min — ABNORMAL LOW (ref >90)
Glucose, Bld: 96 mg/dL (ref 70–99)
Potassium: 4.6 mEq/L (ref 3.7–5.3)
Sodium: 138 mEq/L (ref 137–147)
TOTAL PROTEIN: 6.7 g/dL (ref 6.0–8.3)

## 2014-04-26 LAB — PROTIME-INR
INR: 1.2 (ref 0.00–1.49)
Prothrombin Time: 15.2 seconds (ref 11.6–15.2)

## 2014-04-26 LAB — APTT: aPTT: 42 seconds — ABNORMAL HIGH (ref 24–37)

## 2014-04-26 SURGERY — AMPUTATION, FOOT, RAY
Anesthesia: General | Site: Foot | Laterality: Right

## 2014-04-26 MED ORDER — FENTANYL CITRATE 0.05 MG/ML IJ SOLN
INTRAMUSCULAR | Status: DC | PRN
  Administered 2014-04-26 (×2): 50 ug via INTRAVENOUS

## 2014-04-26 MED ORDER — PROPOFOL 10 MG/ML IV BOLUS
INTRAVENOUS | Status: AC
  Filled 2014-04-26: qty 20

## 2014-04-26 MED ORDER — HYDROCODONE-ACETAMINOPHEN 5-325 MG PO TABS: 1.0000 | ORAL_TABLET | Freq: Four times a day (QID) | ORAL | Status: DC | PRN

## 2014-04-26 MED ORDER — FENTANYL CITRATE 0.05 MG/ML IJ SOLN
25.0000 ug | INTRAMUSCULAR | Status: DC | PRN
  Administered 2014-04-26: 50 ug via INTRAVENOUS

## 2014-04-26 MED ORDER — LIDOCAINE HCL (CARDIAC) 20 MG/ML IV SOLN
INTRAVENOUS | Status: DC | PRN
  Administered 2014-04-26: 50 mg via INTRAVENOUS

## 2014-04-26 MED ORDER — PROPOFOL 10 MG/ML IV BOLUS
INTRAVENOUS | Status: DC | PRN
  Administered 2014-04-26: 130 mg via INTRAVENOUS

## 2014-04-26 MED ORDER — ONDANSETRON HCL 4 MG/2ML IJ SOLN: 4.0000 mg | Freq: Once | INTRAMUSCULAR | Status: DC | PRN

## 2014-04-26 MED ORDER — FENTANYL CITRATE 0.05 MG/ML IJ SOLN
INTRAMUSCULAR | Status: AC
  Filled 2014-04-26: qty 5

## 2014-04-26 MED ORDER — FENTANYL CITRATE 0.05 MG/ML IJ SOLN
INTRAMUSCULAR | Status: AC
  Filled 2014-04-26: qty 2

## 2014-04-26 MED ORDER — LACTATED RINGERS IV SOLN
INTRAVENOUS | Status: DC
  Administered 2014-04-26: 10:00:00 via INTRAVENOUS

## 2014-04-26 SURGICAL SUPPLY — 33 items
BLADE SAW SGTL MED 73X18.5 STR (BLADE) ×3 IMPLANT
BNDG COHESIVE 4X5 TAN STRL (GAUZE/BANDAGES/DRESSINGS) ×3 IMPLANT
BNDG GAUZE ELAST 4 BULKY (GAUZE/BANDAGES/DRESSINGS) ×3 IMPLANT
COVER SURGICAL LIGHT HANDLE (MISCELLANEOUS) ×3 IMPLANT
DRAPE U-SHAPE 47X51 STRL (DRAPES) ×6 IMPLANT
DRSG ADAPTIC 3X8 NADH LF (GAUZE/BANDAGES/DRESSINGS) ×3 IMPLANT
DRSG PAD ABDOMINAL 8X10 ST (GAUZE/BANDAGES/DRESSINGS) ×6 IMPLANT
DURAPREP 26ML APPLICATOR (WOUND CARE) ×3 IMPLANT
ELECT REM PT RETURN 9FT ADLT (ELECTROSURGICAL) ×3
ELECTRODE REM PT RTRN 9FT ADLT (ELECTROSURGICAL) ×1 IMPLANT
GAUZE SPONGE 4X4 12PLY STRL (GAUZE/BANDAGES/DRESSINGS) ×3 IMPLANT
GLOVE BIOGEL PI IND STRL 7.0 (GLOVE) ×1 IMPLANT
GLOVE BIOGEL PI IND STRL 9 (GLOVE) ×2 IMPLANT
GLOVE BIOGEL PI INDICATOR 7.0 (GLOVE) ×2
GLOVE BIOGEL PI INDICATOR 9 (GLOVE) ×4
GLOVE SKINSENSE NS SZ7.0 (GLOVE) ×2
GLOVE SKINSENSE STRL SZ7.0 (GLOVE) ×1 IMPLANT
GLOVE SURG ORTHO 9.0 STRL STRW (GLOVE) ×3 IMPLANT
GOWN STRL REUS W/ TWL XL LVL3 (GOWN DISPOSABLE) ×2 IMPLANT
GOWN STRL REUS W/TWL XL LVL3 (GOWN DISPOSABLE) ×4
KIT BASIN OR (CUSTOM PROCEDURE TRAY) ×3 IMPLANT
KIT ROOM TURNOVER OR (KITS) ×3 IMPLANT
NS IRRIG 1000ML POUR BTL (IV SOLUTION) ×3 IMPLANT
PACK ORTHO EXTREMITY (CUSTOM PROCEDURE TRAY) ×3 IMPLANT
PAD ABD 8X10 STRL (GAUZE/BANDAGES/DRESSINGS) ×3 IMPLANT
PAD ARMBOARD 7.5X6 YLW CONV (MISCELLANEOUS) ×6 IMPLANT
SPONGE LAP 18X18 X RAY DECT (DISPOSABLE) ×3 IMPLANT
STOCKINETTE IMPERVIOUS LG (DRAPES) IMPLANT
SUT ETHILON 2 0 PSLX (SUTURE) ×6 IMPLANT
TOWEL OR 17X24 6PK STRL BLUE (TOWEL DISPOSABLE) ×3 IMPLANT
TOWEL OR 17X26 10 PK STRL BLUE (TOWEL DISPOSABLE) ×3 IMPLANT
UNDERPAD 30X30 INCONTINENT (UNDERPADS AND DIAPERS) ×3 IMPLANT
WATER STERILE IRR 1000ML POUR (IV SOLUTION) ×3 IMPLANT

## 2014-04-26 NOTE — Op Note (Signed)
04/26/2014  12:38 PM  PATIENT:  Caleb DuboisJerald J Hays    PRE-OPERATIVE DIAGNOSIS:  Osteomyelitis Right 4th MTH  POST-OPERATIVE DIAGNOSIS:  Same  PROCEDURE:  Right Foot 4th Ray Amputation  SURGEON:  Nadara MustardUDA,Brianca Fortenberry V, MD  PHYSICIAN ASSISTANT:None ANESTHESIA:   General  PREOPERATIVE INDICATIONS:  Caleb MarinerJerald J Hays is a  73 y.o. male with a diagnosis of Osteomyelitis Right 4th MTH who failed conservative measures and elected for surgical management.    The risks benefits and alternatives were discussed with the patient preoperatively including but not limited to the risks of infection, bleeding, nerve injury, cardiopulmonary complications, the need for revision surgery, among others, and the patient was willing to proceed.  OPERATIVE IMPLANTS: none  OPERATIVE FINDINGS: Good petechial bleeding  OPERATIVE PROCEDURE: Patient is a 73 year old gentleman with peripheral vascular disease status post fifth ray amputation who presents at this time for osteomyelitis of the fourth metatarsal head. Patient presents for a fourth ray amputation. Risks and benefits were discussed including infection neurovascular injury nonhealing of the wound need for additional surgery. Patient states he understands and wished to proceed at this time. Description of procedure patient was brought to the operating room and underwent a general anesthetic. After adequate levels of anesthesia were obtained patient's right lower extremity was prepped using DuraPrep draped into a sterile field. A racquet incision was made around the ulcer and carried down the length of the metatarsal. The metatarsal was resected through the base with an oscillating saw. The ulcer and toe were resected in one block of tissue. The wound was irrigated with normal saline there is no deep abscess. Electrocautery was used for hemostasis. The incision was closed using 2-0 nylon without any tension the skin. The wound was covered with a sterile compressive dressing.  Patient was extubated taken to the PACU in stable condition.

## 2014-04-26 NOTE — Anesthesia Procedure Notes (Signed)
Procedure Name: LMA Insertion Date/Time: 07/01/2014 12:49 PM Performed by: Arlice ColtMANESS, Fredy Gladu B Pre-anesthesia Checklist: Patient identified, Emergency Drugs available, Suction available, Patient being monitored and Timeout performed Patient Re-evaluated:Patient Re-evaluated prior to inductionOxygen Delivery Method: Circle system utilized Preoxygenation: Pre-oxygenation with 100% oxygen Intubation Type: IV induction LMA: LMA inserted LMA Size: 4.0 Number of attempts: 1 Placement Confirmation: positive ETCO2 and breath sounds checked- equal and bilateral Tube secured with: Tape Dental Injury: Teeth and Oropharynx as per pre-operative assessment

## 2014-04-26 NOTE — Transfer of Care (Signed)
Immediate Anesthesia Transfer of Care Note  Patient: Caleb DuboisJerald J Egley  Procedure(s) Performed: Procedure(s): Right Foot 4th Ray Amputation (Right)  Patient Location: PACU  Anesthesia Type:General  Level of Consciousness: awake, alert  and oriented  Airway & Oxygen Therapy: Patient Spontanous Breathing  Post-op Assessment: Report given to PACU RN and Post -op Vital signs reviewed and stable  Post vital signs: Reviewed and stable  Complications: No apparent anesthesia complications

## 2014-04-26 NOTE — Anesthesia Postprocedure Evaluation (Signed)
  Anesthesia Post-op Note  Patient: Barton DuboisJerald J Lafountain  Procedure(s) Performed: Procedure(s): Right Foot 4th Ray Amputation (Right)  Patient Location: PACU  Anesthesia Type:General  Level of Consciousness: awake, alert  and oriented  Airway and Oxygen Therapy: Patient Spontanous Breathing and Patient connected to nasal cannula oxygen  Post-op Pain: mild  Post-op Assessment: Post-op Vital signs reviewed, Patient's Cardiovascular Status Stable, Respiratory Function Stable, Patent Airway and Pain level controlled  Post-op Vital Signs: stable  Last Vitals:  Filed Vitals:   04/26/14 1319  BP: 147/83  Pulse: 52  Temp:   Resp: 15    Complications: No apparent anesthesia complications

## 2014-04-26 NOTE — H&P (Signed)
Caleb MarinerJerald J Hays is an 73 y.o. male.   Chief Complaint: Osteomyelitis ulceration right foot fourth toe HPI: Patient is a 73 year old gentleman with peripheral vascular disease who has developed progressive ischemic gangrenous changes to the fourth toe status post fifth ray amputation. Patient has failed conservative wound care.  Past Medical History  Diagnosis Date  . Gout   . COPD (chronic obstructive pulmonary disease)     LONG TIME SMOKER  . 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 Lajoyce Cornersuda     Past Surgical History  Procedure Laterality Date  . Knee surgery Left     football injury  . Amputation Right 03/01/2014    Procedure: AMPUTATION RAY;  Surgeon: Nadara MustardMarcus Kadian Barcellos V, MD;  [facility];  Service: Orthopedics;  Laterality: Right;  Right Foot 5th Ray Amputation    Family History  Problem Relation Age of Onset  . Heart failure Father   . Heart attack Father   . Heart failure Brother    Social History:  reports that he has been smoking Cigarettes.  He has a 12.5 pack-year smoking history. He has never used smokeless tobacco. He reports that he does not drink alcohol or use illicit drugs.  Allergies: No Known Allergies  No prescriptions prior to admission    No results found for this or any previous visit (from the past 48 hour(s)). No results found.  Review of Systems  All other systems reviewed and are negative.   There were no vitals taken for this visit. Physical Exam  On examination patient has progressive ischemic gangrenous changes to the fourth toe right foot Assessment/Plan Assessment: Ulceration osteomyelitis gangrene right foot fourth toe.  Plan: Will plan for a fourth ray amputation. Risks and benefits were discussed including risk of the wound nonhealing need for additional surgery. Patient states he understands was to proceed at this time.  Caleb Hays 04/26/2014, 6:14 AM

## 2014-04-26 NOTE — Anesthesia Preprocedure Evaluation (Signed)
Anesthesia Evaluation  Patient identified by MRN, date of birth, ID band Patient awake    Reviewed: Allergy & Precautions, H&P , NPO status , Patient's Chart, lab work & pertinent test results  Airway       Dental   Pulmonary Current Smoker,          Cardiovascular     Neuro/Psych    GI/Hepatic   Endo/Other    Renal/GU      Musculoskeletal   Abdominal   Peds  Hematology   Anesthesia Other Findings   Reproductive/Obstetrics                           Anesthesia Physical Anesthesia Plan  ASA: III  Anesthesia Plan: General   Post-op Pain Management:    Induction: Intravenous  Airway Management Planned: LMA  Additional Equipment:   Intra-op Plan:   Post-operative Plan:   Informed Consent: I have reviewed the patients History and Physical, chart, labs and discussed the procedure including the risks, benefits and alternatives for the proposed anesthesia with the patient or authorized representative who has indicated his/her understanding and acceptance.     Plan Discussed with:   Anesthesia Plan Comments:         Anesthesia Quick Evaluation

## 2014-04-26 NOTE — Discharge Instructions (Signed)
Resume using nitroglycerin patches on ankle just above the dressing. Keep dressing dry. Minimize weightbearing right foot.

## 2014-04-29 ENCOUNTER — Encounter (HOSPITAL_COMMUNITY): Payer: Self-pay | Admitting: Orthopedic Surgery

## 2014-05-13 ENCOUNTER — Encounter: Payer: Self-pay | Admitting: Cardiovascular Disease

## 2014-05-13 ENCOUNTER — Ambulatory Visit (INDEPENDENT_AMBULATORY_CARE_PROVIDER_SITE_OTHER): Payer: Medicare PPO | Admitting: Cardiovascular Disease

## 2014-05-13 ENCOUNTER — Encounter (HOSPITAL_COMMUNITY): Payer: Self-pay | Admitting: Pharmacy Technician

## 2014-05-13 VITALS — BP 120/52 | HR 68 | Ht 76.0 in | Wt 175.0 lb

## 2014-05-13 DIAGNOSIS — Z79899 Other long term (current) drug therapy: Secondary | ICD-10-CM

## 2014-05-13 DIAGNOSIS — D689 Coagulation defect, unspecified: Secondary | ICD-10-CM

## 2014-05-13 DIAGNOSIS — Z9889 Other specified postprocedural states: Secondary | ICD-10-CM

## 2014-05-13 DIAGNOSIS — I739 Peripheral vascular disease, unspecified: Secondary | ICD-10-CM

## 2014-05-13 NOTE — Patient Instructions (Signed)
Dr. Allyson SabalBerry has ordered a peripheral angiogram to be done at [facility].  This procedure is going to look at the bloodflow in your lower extremities.  If Dr. Allyson SabalBerry is able to open up the arteries, you will have to spend one night in the hospital.  If he is not able to open the arteries, you will be able to go home that same day.    After the procedure, you will not be allowed to drive for 3 days or push, pull, or lift anything greater than 10 lbs for one week.    You will be required to have the following tests prior to the procedure:  1. Blood work-the blood work can be done no more than 7 days prior to the procedure.  [facility] be [facility] at any [facility] lab.  There is one downstairs on the first floor of this building and one in the [facility] ([address])   *REPS Lorin PicketScott and Kathlene NovemberMike  Please follow up with Dr. Lajoyce Cornersuda this week.

## 2014-05-13 NOTE — Progress Notes (Signed)
05/13/2014 Caleb Hays   05/02/1941  [id number]  Primary Physician Millsaps, [name], NP Primary Cardiologist: Breckin Savannah J. Mckena Chern MD FACP,FACC,FAHA, FSCAI   HPI:  Mr. Caleb Hays is a 73-year-old thin appearing single Caucasian male with no children who is accompanied by his wife today. He was referred by Kim Millsap for peripheral vasodilation. He is retired construction worker. His cardiovascular risk  factors are notable for 60 pack years having quit 2 weeks ago. He also quit taking alcohol 2 years ago at which time he was drinking 6-8 beers a day. He has never had a heart attack or stroke, denies chest pain shortness of breath or claudication. He had a gangrenous right fifth toe with osteomyelitis and underwent amputation by Dr. Duda  back in August and apparently again earlier this month. He had lower extremity arterial Doppler studies performed in our office 03/20/14 revealing ABIs in the 0.5 range bilaterally with occluded exudates and popliteal arteries bilaterally and one-vessel runoff on the right.    Current Outpatient Prescriptions  Medication Sig Dispense Refill  . allopurinol (ZYLOPRIM) 300 MG tablet Take 300 mg by mouth daily.        No current facility-administered medications for this visit.    No Known Allergies  History   Social History  . Marital Status: Single    Spouse Name: N/A    Number of Children: N/A  . Years of Education: N/A   Occupational History  . Not on file.   Social History Main Topics  . Smoking status: Former Smoker -- 0.25 packs/day for 50 years    Types: Cigarettes    Quit date: 04/29/2014  . Smokeless tobacco: Never Used  . Alcohol Use: No     Comment: quit drinking around 2013  . Drug Use: No  . Sexual Activity: Not on file   Other Topics Concern  . Not on file   Social History Narrative  . No narrative on file     Review of Systems: General: negative for chills, fever, night sweats or weight changes.  Cardiovascular:  negative for chest pain, dyspnea on exertion, edema, orthopnea, palpitations, paroxysmal nocturnal dyspnea or shortness of breath Dermatological: negative for rash Respiratory: negative for cough or wheezing Urologic: negative for hematuria Abdominal: negative for nausea, vomiting, diarrhea, bright red blood per rectum, melena, or hematemesis Neurologic: negative for visual changes, syncope, or dizziness All other systems reviewed and are otherwise negative except as noted above.    Blood pressure 120/52, pulse 68, height 6' 4" (1.93 m), weight 175 lb (79.379 kg).  General appearance: alert and no distress Neck: no adenopathy, no carotid bruit, no JVD, supple, symmetrical, trachea midline and thyroid not enlarged, symmetric, no tenderness/mass/nodules Lungs: clear to auscultation bilaterally Heart: regular rate and rhythm, S1, S2 normal, no murmur, click, rub or gallop Extremities: extremities normal, atraumatic, no cyanosis or edema and open wound lateral aspect right foot  EKG not performed today  ASSESSMENT AND PLAN:   Status post right foot surgery The sutures were easily removed and the wound appears open and poorly healed. Lower extremity arterial Doppler studies performed in our office 03/20/14 revealed a right ABI of 0.48 with occluded right SFA and popliteal with one-vessel runoff via the anterior tibial artery. Patient scheduled to see Dr. Duda in the office this week. He may benefit from finding his anatomy and potential endovascular therapy to promote healing.      Maizee Reinhold J. Rosana Farnell MD FACP,FACC,FAHA, FSCAI 05/13/2014 3:56 PM  

## 2014-05-13 NOTE — Assessment & Plan Note (Signed)
The sutures were easily removed and the wound appears open and poorly healed. Lower extremity arterial Doppler studies performed in our office 03/20/14 revealed a right ABI of 0.48 with occluded right SFA and popliteal with one-vessel runoff via the anterior tibial artery. Patient scheduled to see Dr. Lajoyce Cornersuda in the office this week. He may benefit from finding his anatomy and potential endovascular therapy to promote healing.

## 2014-05-23 LAB — CBC
HCT: 34.6 % — ABNORMAL LOW (ref 39.0–52.0)
HEMOGLOBIN: 11 g/dL — AB (ref 13.0–17.0)
MCH: 22.3 pg — ABNORMAL LOW (ref 26.0–34.0)
MCHC: 31.8 g/dL (ref 30.0–36.0)
MCV: 70 fL — ABNORMAL LOW (ref 78.0–100.0)
Platelets: 303 10*3/uL (ref 150–400)
RBC: 4.94 MIL/uL (ref 4.22–5.81)
RDW: 18 % — ABNORMAL HIGH (ref 11.5–15.5)
WBC: 4.6 10*3/uL (ref 4.0–10.5)

## 2014-05-23 LAB — BASIC METABOLIC PANEL
BUN: 14 mg/dL (ref 6–23)
CO2: 24 meq/L (ref 19–32)
Calcium: 8.7 mg/dL (ref 8.4–10.5)
Chloride: 103 mEq/L (ref 96–112)
Creat: 0.92 mg/dL (ref 0.50–1.35)
GLUCOSE: 68 mg/dL — AB (ref 70–99)
POTASSIUM: 5 meq/L (ref 3.5–5.3)
SODIUM: 136 meq/L (ref 135–145)

## 2014-05-23 LAB — APTT: aPTT: 40 seconds — ABNORMAL HIGH (ref 24–37)

## 2014-05-23 LAB — PROTIME-INR
INR: 1.14 (ref <1.50)
PROTHROMBIN TIME: 14.6 s (ref 11.6–15.2)

## 2014-05-27 ENCOUNTER — Encounter (HOSPITAL_COMMUNITY)
Admission: RE | Disposition: A | Discharge status: Home / Self Care | Payer: Self-pay | Source: Ambulatory Visit | Attending: Cardiovascular Disease

## 2014-05-27 ENCOUNTER — Ambulatory Visit (HOSPITAL_COMMUNITY)
Admission: RE | Admit: 2014-05-27 | Discharge: 2014-05-27 | Disposition: A | Discharge status: Home / Self Care | Payer: Medicare PPO | Source: Ambulatory Visit | Attending: Cardiovascular Disease | Admitting: Cardiovascular Disease

## 2014-05-27 DIAGNOSIS — I70213 Atherosclerosis of native arteries of extremities with intermittent claudication, bilateral legs: Secondary | ICD-10-CM

## 2014-05-27 DIAGNOSIS — D689 Coagulation defect, unspecified: Secondary | ICD-10-CM

## 2014-05-27 DIAGNOSIS — Z79899 Other long term (current) drug therapy: Secondary | ICD-10-CM

## 2014-05-27 DIAGNOSIS — Z9889 Other specified postprocedural states: Secondary | ICD-10-CM

## 2014-05-27 DIAGNOSIS — Z8249 Family history of ischemic heart disease and other diseases of the circulatory system: Secondary | ICD-10-CM | POA: Insufficient documentation

## 2014-05-27 DIAGNOSIS — I70235 Atherosclerosis of native arteries of right leg with ulceration of other part of foot: Secondary | ICD-10-CM

## 2014-05-27 DIAGNOSIS — F17210 Nicotine dependence, cigarettes, uncomplicated: Secondary | ICD-10-CM | POA: Diagnosis not present

## 2014-05-27 DIAGNOSIS — J449 Chronic obstructive pulmonary disease, unspecified: Secondary | ICD-10-CM | POA: Diagnosis not present

## 2014-05-27 DIAGNOSIS — I739 Peripheral vascular disease, unspecified: Secondary | ICD-10-CM | POA: Diagnosis present

## 2014-05-27 DIAGNOSIS — I70203 Unspecified atherosclerosis of native arteries of extremities, bilateral legs: Secondary | ICD-10-CM | POA: Insufficient documentation

## 2014-05-27 DIAGNOSIS — Z89421 Acquired absence of other right toe(s): Secondary | ICD-10-CM | POA: Insufficient documentation

## 2014-05-27 HISTORY — PX: LOWER EXTREMITY ANGIOGRAM: SHX5508

## 2014-05-27 SURGERY — ANGIOGRAM, LOWER EXTREMITY
Anesthesia: LOCAL

## 2014-05-27 MED ORDER — FENTANYL CITRATE 0.05 MG/ML IJ SOLN
INTRAMUSCULAR | Status: AC
  Filled 2014-05-27: qty 2

## 2014-05-27 MED ORDER — SODIUM CHLORIDE 0.9 % IV SOLN
INTRAVENOUS | Status: DC
  Administered 2014-05-27: 11:00:00 via INTRAVENOUS

## 2014-05-27 MED ORDER — ASPIRIN 81 MG PO CHEW
CHEWABLE_TABLET | ORAL | Status: AC
  Filled 2014-05-27: qty 1

## 2014-05-27 MED ORDER — MIDAZOLAM HCL 2 MG/2ML IJ SOLN
INTRAMUSCULAR | Status: AC
  Filled 2014-05-27: qty 2

## 2014-05-27 MED ORDER — LIDOCAINE HCL (PF) 1 % IJ SOLN
INTRAMUSCULAR | Status: AC
  Filled 2014-05-27: qty 30

## 2014-05-27 MED ORDER — ACETAMINOPHEN 325 MG PO TABS: 650.0000 mg | ORAL_TABLET | ORAL | Status: DC | PRN

## 2014-05-27 MED ORDER — SODIUM CHLORIDE 0.9 % IJ SOLN: 3.0000 mL | INTRAMUSCULAR | Status: DC | PRN

## 2014-05-27 MED ORDER — ASPIRIN 81 MG PO CHEW
81.0000 mg | CHEWABLE_TABLET | ORAL | Status: AC
  Administered 2014-05-27: 81 mg via ORAL

## 2014-05-27 MED ORDER — ONDANSETRON HCL 4 MG/2ML IJ SOLN: 4.0000 mg | Freq: Four times a day (QID) | INTRAMUSCULAR | Status: DC | PRN

## 2014-05-27 MED ORDER — SODIUM CHLORIDE 0.9 % IV SOLN: INTRAVENOUS | Status: AC

## 2014-05-27 MED ORDER — MORPHINE SULFATE 2 MG/ML IJ SOLN: 2.0000 mg | INTRAMUSCULAR | Status: DC | PRN

## 2014-05-27 MED ORDER — HEPARIN (PORCINE) IN NACL 2-0.9 UNIT/ML-% IJ SOLN
INTRAMUSCULAR | Status: AC
  Filled 2014-05-27: qty 1000

## 2014-05-27 NOTE — CV Procedure (Signed)
Ashley MarinerJerald J Lippens is a 73 y.o. male    [id number] LOCATION:  FACILITY: [facility]  PHYSICIAN: Nanetta BattyJonathan Mahogany Torrance, M.D. [birth year]   DATE OF PROCEDURE:  05/27/2014  DATE OF DISCHARGE:     PV Angiogram/Intervention    History obtained from chart review.Mr. [name] is a 73 year old thin appearing single Caucasian male with no children. He was referred by Tally JoeKim Millsap for peripheral vasodilation. He is retired Corporate investment bankerconstruction worker. His cardiovascular risk factors are notable for 60 pack years having quit 2 weeks ago. He also quit taking alcohol 2 years ago at which time he was drinking 6-8 beers a day. He has never had a heart attack or stroke, denies chest pain shortness of breath or claudication. He had a gangrenous right fifth toe with osteomyelitis and underwent amputation by Dr. Lajoyce Cornersuda back in August and apparently in August. He had lower extremity arterial Doppler studies performed in our office 03/20/14 revealing ABIs in the 0.5 range bilaterally with occluded SF A's and popliteal arteries bilaterally and one-vessel runoff on the right.   PROCEDURE DESCRIPTION:   The patient was brought to the second floor [facility] lab in the postabsorptive state. He was premedicated with Valium 5 mg by mouth, IV Versed and fentanyl. His left groinwas prepped and shaved in usual sterile fashion. Xylocaine 1% was used for local anesthesia. A 5 French sheath was inserted into the left common femoral artery using standard Seldinger technique.a 5 French pigtail catheter was placed in the distal abdominal aorta. Distal abdominal aortography, bilateral iliac angiography with bifemoral runoff was performed using bolus chase digital subtraction step table technique. Omnipaque dye was used for the entirety of the case. Retrograde aortic pressure was monitored during the case. Contralateral access was obtained with a crossover catheter and dental catheter. Delayed imaging was performed of the below the knee tibial  vessels down to the foot.   HEMODYNAMICS:    AO SYSTOLIC/AO DIASTOLIC: 166/82   Angiographic Data:   1: Abdominal aortogram-the distal abdominal aorta was fluoroscopically calcified but free of significant disease.  2: Left lower extremity-the left SFA was occluded at its origin and did not reconstitute. The popliteal artery likewise was occluded. I was able to visualize at least one or 2 tibial vessels by collaterals filling.  3: Right lower extremity-the right SFA was occluded at its origin and did not reconstitute. Right popliteal was occluded as well both above and below the knee. Delayed imaging with contralateral access revealed patent anterior tibial and peroneal arteries at their origin down to the foot.  IMPRESSION:Mr. Cayton has occluded SFAs and popliteals bilaterally. He does have a patent anterior tibial and peroneal on the right. He may be a candidate for femoral tibial bypass grafting for limb salvage in the setting of critical limb ischemia. The sheath was removed and pressure was held on the groin to achieve hemostasis. The patient left the lab in stable condition. He will be discharged home today as an outpatient and see me later this week in the office. I have relayed this information to Dr. Lajoyce Cornersuda ,  The surgeon who had performed the amputation.    Runell GessBERRY,Leauna Sharber J. MD, [facility] 05/27/2014 1:23 PM

## 2014-05-27 NOTE — Discharge Instructions (Signed)

## 2014-05-27 NOTE — H&P (View-Only) (Signed)
05/13/2014 Caleb DuboisJerald J Hays   1941-07-19  [id number]  Primary Physician Caleb Hays, Caleb MillinKIMBERLY M, NP Primary Cardiologist: Caleb Hays Haywood MD Caleb RenoFACP,FACC,FAHA, FSCAI   HPI:  Mr. Caleb Hays is a 73 year old thin appearing single Caucasian male with no children who is accompanied by his wife today. He was referred by Caleb Hays for peripheral vasodilation. He is retired Corporate investment bankerconstruction worker. His cardiovascular risk  factors are notable for 60 pack years having quit 2 weeks ago. He also quit taking alcohol 2 years ago at which time he was drinking 6-8 beers a day. He has never had a heart attack or stroke, denies chest pain shortness of breath or claudication. He had a gangrenous right fifth toe with osteomyelitis and underwent amputation by Dr. Lajoyce Hays  back in August and apparently again earlier this month. He had lower extremity arterial Doppler studies performed in our office 03/20/14 revealing ABIs in the 0.5 range bilaterally with occluded exudates and popliteal arteries bilaterally and one-vessel runoff on the right.    Current Outpatient Prescriptions  Medication Sig Dispense Refill  . allopurinol (ZYLOPRIM) 300 MG tablet Take 300 mg by mouth daily.        No current facility-administered medications for this visit.    No Known Allergies  History   Social History  . Marital Status: Single    Spouse Name: N/A    Number of Children: N/A  . Years of Education: N/A   Occupational History  . Not on file.   Social History Main Topics  . Smoking status: Former Smoker -- 0.25 packs/day for 50 years    Types: Cigarettes    Quit date: 04/29/2014  . Smokeless tobacco: Never Used  . Alcohol Use: No     Comment: quit drinking around 2013  . Drug Use: No  . Sexual Activity: Not on file   Other Topics Concern  . Not on file   Social History Narrative  . No narrative on file     Review of Systems: General: negative for chills, fever, night sweats or weight changes.  Cardiovascular:  negative for chest pain, dyspnea on exertion, edema, orthopnea, palpitations, paroxysmal nocturnal dyspnea or shortness of breath Dermatological: negative for rash Respiratory: negative for cough or wheezing Urologic: negative for hematuria Abdominal: negative for nausea, vomiting, diarrhea, bright red blood per rectum, melena, or hematemesis Neurologic: negative for visual changes, syncope, or dizziness All other systems reviewed and are otherwise negative except as noted above.    Blood pressure 120/52, pulse 68, height 6\' 4"  (1.93 Hays), weight 175 lb (79.379 kg).  General appearance: alert and no distress Neck: no adenopathy, no carotid bruit, no JVD, supple, symmetrical, trachea midline and thyroid not enlarged, symmetric, no tenderness/mass/nodules Lungs: clear to auscultation bilaterally Heart: regular rate and rhythm, S1, S2 normal, no murmur, click, rub or gallop Extremities: extremities normal, atraumatic, no cyanosis or edema and open wound lateral aspect right foot  EKG not performed today  ASSESSMENT AND PLAN:   Status post right foot surgery The sutures were easily removed and the wound appears open and poorly healed. Lower extremity arterial Doppler studies performed in our office 03/20/14 revealed a right ABI of 0.48 with occluded right SFA and popliteal with one-vessel runoff via the anterior tibial artery. Patient scheduled to see Dr. Lajoyce Hays in the office this week. He may benefit from finding his anatomy and potential endovascular therapy to promote healing.      Caleb GessJonathan J. Welby Montminy MD FACP,FACC,FAHA, [facility] 05/13/2014 3:56 PM

## 2014-05-27 NOTE — Interval H&P Note (Signed)
History and Physical Interval Note:  05/27/2014 12:39 PM  Caleb Hays  has presented today for surgery, with the diagnosis of pad  The various methods of treatment have been discussed with the patient and family. After consideration of risks, benefits and other options for treatment, the patient has consented to  Procedure(s): LOWER EXTREMITY ANGIOGRAM (N/A) as a surgical intervention .  The patient's history has been reviewed, patient examined, no change in status, stable for surgery.  I have reviewed the patient's chart and labs.  Questions were answered to the patient's satisfaction.     Runell GessBERRY,Nikodem Leadbetter J

## 2014-05-27 NOTE — Progress Notes (Signed)
Site area: left groin a 5 french arterial sheath was removed  Site Prior to Removal:  Level 0  Pressure Applied For 20 MINUTES    Minutes Beginning at 1340p  Manual:   Yes.    Patient Status During Pull: stable  Post Pull Groin Site:  Level 0  Post Pull Instructions Given:  Yes.    Post Pull Pulses Present:  Yes.    Dressing Applied:  Yes.    Comments:  VS remain stable during sheath pull.  Pt denies any discomfort at this time

## 2014-05-27 NOTE — Consult Note (Signed)
Hospital Consult Reason for referral: right foot ulcer Consulting provider: Dr. Allyson SabalBerry  History of Present Illness  Caleb Hays is a 73 y.o. (Mar 25, 1941) male who presents with chief complaint: non healing right 4th ray amputation site. He had an abdominal aortogram with bilateral runoff today 05/27/14 by Dr. Allyson SabalBerry. His amputation was performed by Dr. Lajoyce Cornersuda on 04/26/14. He had a previous right 5th ray amputation by Dr. Lajoyce Cornersuda on 03/01/14 that healed without complications. He had these amputations due to a "toe abscess." He denies any trauma.  He has no prior history of non-healing wounds. He denies any pain. He denies any intermittent claudication or rest pain. He ambulates with a crutch.   He is a previous smoker quitting one month ago. He is not diabetic. He does not have hypertension or hyperlipidemia.   Past Medical History  Diagnosis Date  . Gout   . COPD (chronic obstructive pulmonary disease)     LONG TIME SMOKER  . 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 Lajoyce Cornersuda   . Critical lower limb ischemia     Past Surgical History  Procedure Laterality Date  . Knee surgery Left     football injury  . Amputation Right 03/01/2014    Procedure: AMPUTATION RAY;  Surgeon: Nadara MustardMarcus Duda V, MD;  [facility];  Service: Orthopedics;  Laterality: Right;  Right Foot 5th Ray Amputation  . Amputation Right 04/26/2014    Procedure: Right Foot 4th Ray Amputation;  Surgeon: Nadara MustardMarcus Duda V, MD;  [facility];  Service: Orthopedics;  Laterality: Right;    History   Social History  . Marital Status: Single    Spouse Name: N/A    Number of Children: N/A  . Years of Education: N/A   Occupational History  . Not on file.   Social History Main Topics  . Smoking status: Former Smoker -- 0.25 packs/day for 50 years    Types: Cigarettes    Quit date: 04/29/2014  . Smokeless tobacco: Never Used  . Alcohol Use: No     Comment: quit drinking around 2013  . Drug Use: No  .  Sexual Activity: Not on file   Other Topics Concern  . Not on file   Social History Narrative  . No narrative on file    Family History  Problem Relation Age of Onset  . Heart failure Father   . Heart attack Father   . Heart failure Brother     No current facility-administered medications on file prior to encounter.   Current Outpatient Prescriptions on File Prior to Encounter  Medication Sig Dispense Refill  . allopurinol (ZYLOPRIM) 300 MG tablet Take 300 mg by mouth daily.       No Known Allergies  REVIEW OF SYSTEMS:  (Positives checked otherwise negative)  CARDIOVASCULAR:  []  chest pain, []  chest pressure, []  palpitations, []  shortness of breath when laying flat, []  shortness of breath with exertion,  []  pain in feet when walking, []  pain in feet when laying flat, []  history of blood clot in veins (DVT), []  history of phlebitis, []  swelling in legs, []  varicose veins  PULMONARY:  []  productive cough, []  asthma, []  wheezing  NEUROLOGIC:  []  weakness in arms or legs, []  numbness in arms or legs, []  difficulty speaking or slurred speech, []  temporary loss of vision in one eye, []  dizziness  HEMATOLOGIC:  []  bleeding problems, []  problems with blood clotting too easily  MUSCULOSKEL:  [  x] joint pain, []  joint swelling  GASTROINTEST:  []  vomiting blood, []  blood in stool     GENITOURINARY:  []  burning with urination, []  blood in urine  PSYCHIATRIC:  []  history of major depression  INTEGUMENTARY:  []  rashes, [x]  ulcers  CONSTITUTIONAL:  []  fever, []  chills  For VQI Use Only  PRE-ADM LIVING: Home  AMB STATUS: Ambulatory with Assistance  CAD Sx: None  PRIOR CHF: None  STRESS TEST: [ ]  No, [ ]  Normal, [ ]  + ischemia, [ ]  + MI, [ ]  Both   Physical Examination  Filed Vitals:   05/27/14 1409 05/27/14 1426 05/27/14 1427 05/27/14 1450  BP: 161/90  132/71 140/74  Pulse: 68  64 92  Temp:  97.7 F (36.5 C)    TempSrc:      Resp: 12     Height:      Weight:        SpO2: 100%  100% 100%   Body mass index is 21.31 kg/(m^2).  General: A&O x 3, WD thin male in NAD  Head: Hurt/AT  Neck: Supple  Pulmonary: Sym exp, good air movt, CTAB, no rales, rhonchi, & wheezing  Cardiac: RRR, Nl S1, S2, no Murmurs, rubs or gallops  Vascular: Vessel Right Left  Radial Palpable Palpable  Carotid Palpable, without bruit Palpable, without bruit  Aorta Not palpable N/A  Femoral Palpable Did not palpate. S/p groin cannulation  Popliteal Not palpable Not palpable  PT + doppler + doppler  DP + doppler + doppler   Gastrointestinal: soft, NTND, -G/R, - HSM, - masses  Musculoskeletal: M/S 5/5 throughout. Right 4th and 5th ray amputation. Amputation with serous drainage and separation. Some black eschar at distal ends.   Neurologic: CN 2-12 intact. Pain and light touch intact in extremities. Motor exam as listed above  Psychiatric: Judgment intact, Mood & affect appropriate for pt's clinical situation  Dermatologic: See M/S exam for extremity exam, no rashes otherwise noted. Bilateral onychomosis.   Medical Decision Making  Caleb Hays is a 73 y.o. male who presents with non healing wound of right 4th ray amputation site.   Abdominal aortogram with bilateral runoff performed today by Dr. Allyson SabalBerry. Results revealed occluded SFAs and popliteals bilaterally. He has patent right anterior tibial and peroneal arteries. Recommend right femoral to tibial bypass to promote wound healing. He will see Dr. Allyson SabalBerry this Friday. He will need cardiac risk assessment prior to surgery. He will then follow up with Dr. Darrick PennaFields in the office to further discuss surgery.   Maris BergerKimberly Trinh, PA-C [facility] [place] Office: [phone] Pager: [phone]  05/27/2014, 3:53 PM   History and exam findings as above.  Poorly healing amputation site.  Angio reviewed.  He has a reasonable Anterior tibial artery for a distal target.  Will have Dr Allyson SabalBerry cardiac risk  assess this Friday.  He will see me later this week for vein mapping and further discussion regarding possible bypass.  Fabienne Brunsharles Harveen Flesch, MD [facility] Office: [phone] Pager: [phone]

## 2014-05-28 ENCOUNTER — Other Ambulatory Visit: Payer: Self-pay | Admitting: *Deleted

## 2014-05-28 DIAGNOSIS — I7025 Atherosclerosis of native arteries of other extremities with ulceration: Secondary | ICD-10-CM

## 2014-05-28 DIAGNOSIS — Z01818 Encounter for other preprocedural examination: Secondary | ICD-10-CM

## 2014-05-29 ENCOUNTER — Telehealth: Payer: Self-pay | Admitting: Cardiovascular Disease

## 2014-05-29 ENCOUNTER — Ambulatory Visit (HOSPITAL_COMMUNITY)
Admission: RE | Admit: 2014-05-29 | Discharge: 2014-05-29 | Disposition: A | Discharge status: Home / Self Care | Payer: Medicare PPO | Source: Ambulatory Visit | Attending: Vascular Surgery | Admitting: Vascular Surgery

## 2014-05-29 ENCOUNTER — Encounter: Payer: Self-pay | Admitting: Vascular Surgery

## 2014-05-29 ENCOUNTER — Telehealth: Payer: Self-pay | Admitting: Vascular Surgery

## 2014-05-29 DIAGNOSIS — I7025 Atherosclerosis of native arteries of other extremities with ulceration: Secondary | ICD-10-CM | POA: Diagnosis not present

## 2014-05-29 DIAGNOSIS — Z01818 Encounter for other preprocedural examination: Secondary | ICD-10-CM

## 2014-05-29 DIAGNOSIS — L97409 Non-pressure chronic ulcer of unspecified heel and midfoot with unspecified severity: Secondary | ICD-10-CM

## 2014-05-29 NOTE — Telephone Encounter (Signed)
Pt had an angiogram on Monday. He has a big size bruise,is this normal?

## 2014-05-29 NOTE — Telephone Encounter (Signed)
OK to wait until Friday.   Informed niece that if she did not hear from me, everything was OK until appmt 11/13

## 2014-05-29 NOTE — Telephone Encounter (Signed)
-----   Message from Sharee PimpleMarilyn K McChesney, RN sent at 05/28/2014 10:28 AM EST ----- Regarding: Schedule   ----- Message -----    From: Sherren Kernsharles E Fields, MD    Sent: 05/27/2014   9:31 PM      To: [facility] Charge Pool  Pt needs office visit this Thursday and bilateral leg vein mapping.  Level 5 consult today from Nanetta BattyJonathan Berry for non healing right foot wound.  Fabienne Brunsharles Fields

## 2014-05-29 NOTE — Telephone Encounter (Signed)
Elon JesterMichele spoke with patients niece to confirm appt, dpm

## 2014-05-29 NOTE — Telephone Encounter (Signed)
Patient's niece was called with appmt reminder for Friday 11/13 - she wanted to report his groin is bruised where he had angiogram - about "hand-size". No bleeding, no knots, not other complaints

## 2014-05-30 ENCOUNTER — Encounter: Payer: Self-pay | Admitting: Vascular Surgery

## 2014-05-30 ENCOUNTER — Other Ambulatory Visit: Payer: Self-pay

## 2014-05-30 ENCOUNTER — Encounter: Payer: Self-pay | Admitting: *Deleted

## 2014-05-30 ENCOUNTER — Ambulatory Visit (INDEPENDENT_AMBULATORY_CARE_PROVIDER_SITE_OTHER): Payer: Medicare PPO | Admitting: Vascular Surgery

## 2014-05-30 VITALS — BP 102/78 | HR 83 | Ht 76.0 in | Wt 177.0 lb

## 2014-05-30 DIAGNOSIS — L98499 Non-pressure chronic ulcer of skin of other sites with unspecified severity: Secondary | ICD-10-CM

## 2014-05-30 DIAGNOSIS — I70209 Unspecified atherosclerosis of native arteries of extremities, unspecified extremity: Secondary | ICD-10-CM

## 2014-05-30 DIAGNOSIS — I739 Peripheral vascular disease, unspecified: Secondary | ICD-10-CM

## 2014-05-30 NOTE — Progress Notes (Signed)
[facility] [place] HISTORY AND PHYSICAL   CC:  Poorly healing amputation site.   HPI: This is a 73 y.o. male who presents for discussion of right lower extremity bypass. He is patient of Dr. Allyson SabalBerry who was seen as a hospital consult three days ago on 05/27/14. He underwent a right 4th ray amputation on 04/26/14 by Dr. Lajoyce Cornersuda. That amputation has not healed is progressively worsening. He previously had a right 5th ray amputation performed on 03/01/14 that did heal. He had amputations performed due to a "toe abscess." He had abdominal aortogram with bilateral runoff performed by Dr. Allyson SabalBerry on 05/27/14 which revealed patent right anterior tibial and peroneal arteries.   He denies any prior history of non-healing wounds, intermittent claudication or rest pain.  He is a previous smoker quitting one month ago. He is not diabetic. He does not have hypertension or hyperlipidemia.   Past Medical History  Diagnosis Date  . Gout   . COPD (chronic obstructive pulmonary disease)     LONG TIME SMOKER  . 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 Lajoyce Cornersuda   . Critical lower limb ischemia    Past Surgical History  Procedure Laterality Date  . Knee surgery Left     football injury  . Amputation Right 03/01/2014    Procedure: AMPUTATION RAY;  Surgeon: Nadara MustardMarcus Duda V, MD;  [facility];  Service: Orthopedics;  Laterality: Right;  Right Foot 5th Ray Amputation  . Amputation Right 04/26/2014    Procedure: Right Foot 4th Ray Amputation;  Surgeon: Nadara MustardMarcus Duda V, MD;  [facility];  Service: Orthopedics;  Laterality: Right;    No Known Allergies  Current Outpatient Prescriptions  Medication Sig Dispense Refill  . allopurinol (ZYLOPRIM) 300 MG tablet Take 300 mg by mouth daily.     . nitroGLYCERIN (NITRODUR - DOSED IN MG/24 HR) 0.2 mg/hr patch Place 0.2 mg onto the skin daily.     No current facility-administered medications for this visit.    Family History    Problem Relation Age of Onset  . Heart failure Father   . Heart attack Father   . Heart failure Brother     History   Social History  . Marital Status: Single    Spouse Name: N/A    Number of Children: N/A  . Years of Education: N/A   Occupational History  . Not on file.   Social History Main Topics  . Smoking status: Former Smoker -- 0.25 packs/day for 50 years    Types: Cigarettes    Quit date: 04/29/2014  . Smokeless tobacco: Never Used  . Alcohol Use: No     Comment: quit drinking around 2013  . Drug Use: No  . Sexual Activity: Not on file   Other Topics Concern  . Not on file   Social History Narrative     ROS: [x]  Positive   [ ]  Negative   [ ]  All sytems reviewed and are negative  Cardiovascular: []  chest pain/pressure []  palpitations []  SOB lying flat []  DOE []  pain in legs while walking []  pain in feet when lying flat []  hx of DVT []  hx of phlebitis []  swelling in legs []  varicose veins  Pulmonary: []  productive cough []  asthma []  wheezing  Neurologic: []  weakness in []  arms []  legs []  numbness in []  arms []  legs [] difficulty speaking or slurred speech []  temporary loss of vision in one eye []  dizziness  Hematologic: []  bleeding problems []  problems with blood clotting easily  GI []  vomiting blood []  blood in stool  GU: []  burning with urination []  blood in urine  Psychiatric: []  hx of major depression  Integumentary: []  rashes [x]  ulcers  Constitutional: []  fever []  chills   PHYSICAL EXAMINATION:  Filed Vitals:   05/30/14 1501  BP: 102/78  Pulse: 83   Body mass index is 21.55 kg/(m^2).  General:  WD thin male in NAD Gait: Ambulating with crutch Eyes: Pupils equal Pulmonary: normal non-labored breathing  Vascular Exam/Pulses: +doppler signals of DP/PT b/l  Extremities: Right 4th and 5th ray amputation. Amputation site with separation with black eschar.  Musculoskeletal: no muscle wasting or  atrophy  Neurologic: A&O X 3; Appropriate Affect ; SENSATION: normal; MOTOR FUNCTION:  moving all extremities equally. Speech is fluent/normal   Non-Invasive Vascular Imaging:   Lower extremity saphenous vein mapping (05/29/14) Right GSV with diameter diameter of 0.38 cm at saphenofemoral junction. Progressively smaller distally. Measures .16 at distal calf. Long segments of thickened walls which may be scarring or recanalized thrombus. Does not appear suitable for bypass graft  ASSESSMENT/PLAN: 73 y.o. male with non-healing right 4th ray amputation site that will require peripheral bypass. Unfortunately his vein mapping does not show any suitable bypass options and may require a graft.The patency rates of synthetic grafts were discussed with the patient. The procedure, risks and benefits of the procedure were explained at length with the patient and he is willing to proceed.  He will be scheduled for right femoral to below knee popliteal versus tibial bypass on 06/03/14    Maris BergerKimberly Lorrin Nawrot, PA-C [facility] [phone]  Clinic MD:  Pt seen and examined in conjunction with Dr. Darrick PennaFields  History and exam details as above. The patient has no suitable lower extremity saphenous vein usable for bypass. I discussed with the patient and his daughter today that we would be using prosthetic and that the patency of this is inferior to vein but that without revascularization I did not believe the foot would heal. Review of his arteriogram shows the origins of the peroneal and anterior tibial arteries are patent with a very diseased below-knee popliteal segment. I will exposes below-knee popliteal artery in the event that we can endarterectomize this and potentially base of bypass here rather than to his anterior tibial artery which I think would had even lower patency. All this and the risk of limb loss being high was discussed with the patient today. He is scheduled to see Dr. Allyson SabalBerry tomorrow  for cardiac risk stratification. His bypass is scheduled tentatively for Monday, November 16 pending cardiac evaluation.  Fabienne Brunsharles Fields, MD [facility] [place] Office: [phone] Pager: [phone]

## 2014-05-31 ENCOUNTER — Ambulatory Visit (INDEPENDENT_AMBULATORY_CARE_PROVIDER_SITE_OTHER): Payer: Medicare PPO | Admitting: Cardiovascular Disease

## 2014-05-31 ENCOUNTER — Encounter: Payer: Self-pay | Admitting: Cardiovascular Disease

## 2014-05-31 VITALS — BP 122/64 | HR 80 | Ht 76.0 in | Wt 178.5 lb

## 2014-05-31 DIAGNOSIS — I70229 Atherosclerosis of native arteries of extremities with rest pain, unspecified extremity: Secondary | ICD-10-CM | POA: Insufficient documentation

## 2014-05-31 DIAGNOSIS — I998 Other disorder of circulatory system: Secondary | ICD-10-CM | POA: Insufficient documentation

## 2014-05-31 DIAGNOSIS — Z01818 Encounter for other preprocedural examination: Secondary | ICD-10-CM

## 2014-05-31 NOTE — Progress Notes (Signed)
05/31/2014 Barton DuboisJerald J Sinyard   12-Aug-1940  [id number]  Primary Physician Millsaps, Joelene MillinKIMBERLY M, NP Primary Cardiologist: Runell GessJonathan J. Tanaia Hawkey MD Roseanne RenoFACP,FACC,FAHA, FSCAI   HPI:  Mr. Caleb Hays is a 73 year old thin appearing single Caucasian male with no children who is accompanied by his niece today. He was referred by Tally JoeKim Millsap for peripheral vascular evaluation. He is retired Corporate investment bankerconstruction worker. His cardiovascular risk factors are notable for 60 pack years having quit 2 weeks ago. He also quit taking alcohol 2 years ago at which time he was drinking 6-8 beers a day. He has never had a heart attack or stroke, denies chest pain shortness of breath or claudication. He had a gangrenous right fifth toe with osteomyelitis and underwent amputation by Dr. Lajoyce Cornersuda back in August and apparently again earlier this month. He had lower extremity arterial Doppler studies performed in our office 03/20/14 revealing ABIs in the 0.5 range bilaterally with occluded superficial femoral arteries and popliteal arteries bilaterally and one-vessel runoff on the right. I angiogram him on 05/27/14 confirming his anatomy with occluded superficial femoral arteries from the origin down to the below-the-knee popliteal artery with reconstitution of the anterior tibial and peroneal arteries. He was seen in consultation by Dr. Darrick PennaFields who graciously arranged expedited right fem-tib bypass grafting this coming Monday. Apparently vein mapping did not reveal any suitable venous conduit and therefore PTFE would be required. After inspection of his wound today apparently and is slowly healing. I'm going to contact Dr. Darrick PennaFields to see if we can't delay revascularization and follow his wound closely as an outpatient. He will also need preoperative clearance with pharmacologic stress testing.   Current Outpatient Prescriptions  Medication Sig Dispense Refill  . allopurinol (ZYLOPRIM) 300 MG tablet Take 300 mg by mouth daily.     . nitroGLYCERIN  (NITRODUR - DOSED IN MG/24 HR) 0.2 mg/hr patch Place 0.2 mg onto the skin daily.     No current facility-administered medications for this visit.    No Known Allergies  History   Social History  . Marital Status: Single    Spouse Name: N/A    Number of Children: N/A  . Years of Education: N/A   Occupational History  . Not on file.   Social History Main Topics  . Smoking status: Former Smoker -- 0.25 packs/day for 50 years    Types: Cigarettes    Quit date: 04/29/2014  . Smokeless tobacco: Never Used  . Alcohol Use: No     Comment: quit drinking around 2013  . Drug Use: No  . Sexual Activity: Not on file   Other Topics Concern  . Not on file   Social History Narrative     Review of Systems: General: negative for chills, fever, night sweats or weight changes.  Cardiovascular: negative for chest pain, dyspnea on exertion, edema, orthopnea, palpitations, paroxysmal nocturnal dyspnea or shortness of breath Dermatological: negative for rash Respiratory: negative for cough or wheezing Urologic: negative for hematuria Abdominal: negative for nausea, vomiting, diarrhea, bright red blood per rectum, melena, or hematemesis Neurologic: negative for visual changes, syncope, or dizziness All other systems reviewed and are otherwise negative except as noted above.    Blood pressure 122/64, pulse 80, height 6\' 4"  (1.93 m), weight 178 lb 8 oz (80.967 kg).  General appearance: alert and no distress Neck: no adenopathy, no carotid bruit, no JVD, supple, symmetrical, trachea midline and thyroid not enlarged, symmetric, no tenderness/mass/nodules Lungs: clear to auscultation bilaterally Heart: regular rate and rhythm,  S1, S2 normal, no murmur, click, rub or gallop Extremities: extremities normal, atraumatic, no cyanosis or edema and slowly healing right foot ischemic ulcer/nonhealing wound  EKG not performed today  ASSESSMENT AND PLAN:   Critical lower limb ischemia History of  right first and second toe amputations by Dr. Lajoyce Cornersuda with nonhealing wound and lower extremity arterial Doppler studies performed 03/20/14 which showed ABIs in the 0.5 range bilaterally with occluded SFA and popliteal arteries. I angiograms him on 05/27/14 confirming occluded SFA and popliteal arteries bilaterally with reconstitution of the anterior tibial and peroneal arteries below the knee. The anterior tip did go to the foot. Apparently his wound is slowly healing. He washes addresses it daily. He is scheduled to see his orthopedic surgeon next week. Dr. Darrick PennaFields saw him in consultation and arrange for him to undergo right fem-tib below-knee tib bypass grafting with a prosthetic graft due to no sufficient venous conduits for mapping this coming Monday. He is seen back today for postop evaluation and for cardiac clearance. I believe given the fact that his wound is slowly healing we can potentially delay revascularization and follow the progress of his wound. I am going to get if oncologic Myoview stress test to stratify him in the event that he needs vascular surgery. He is at high risk for coronary artery disease given his long smoking history and his severe PAD.      Runell GessJonathan J. Lynelle Weiler MD FACP,FACC,FAHA, [facility] 05/31/2014 12:31 PM

## 2014-05-31 NOTE — Patient Instructions (Signed)
Dr. Allyson SabalBerry has ordered for you to have a Lexiscan Myoview- this is a test that looks at the blood flow to your heart muscle.  It takes approximately 2 1/2 hours. Please follow instruction sheet, as given.   Your physician wants you to follow-up in 6 weeks with Dr. Allyson SabalBerry.. You will receive a reminder letter in the mail 2 months in advance. If you do not receive a letter, please call our office to schedule the follow-up appointment.

## 2014-05-31 NOTE — Assessment & Plan Note (Signed)
History of right first and second toe amputations by Dr. Lajoyce Cornersuda with nonhealing wound and lower extremity arterial Doppler studies performed 03/20/14 which showed ABIs in the 0.5 range bilaterally with occluded SFA and popliteal arteries. I angiograms him on 05/27/14 confirming occluded SFA and popliteal arteries bilaterally with reconstitution of the anterior tibial and peroneal arteries below the knee. The anterior tip did go to the foot. Apparently his wound is slowly healing. He washes addresses it daily. He is scheduled to see his orthopedic surgeon next week. Dr. Darrick PennaFields saw him in consultation and arrange for him to undergo right fem-tib below-knee tib bypass grafting with a prosthetic graft due to no sufficient venous conduits for mapping this coming Monday. He is seen back today for postop evaluation and for cardiac clearance. I believe given the fact that his wound is slowly healing we can potentially delay revascularization and follow the progress of his wound. I am going to get if oncologic Myoview stress test to stratify him in the event that he needs vascular surgery. He is at high risk for coronary artery disease given his long smoking history and his severe PAD.

## 2014-06-03 ENCOUNTER — Encounter (HOSPITAL_COMMUNITY): Admission: RE | Payer: Self-pay | Source: Ambulatory Visit

## 2014-06-03 ENCOUNTER — Inpatient Hospital Stay (HOSPITAL_COMMUNITY): Admission: RE | Admit: 2014-06-03 | Payer: Medicare PPO | Source: Ambulatory Visit | Admitting: Vascular Surgery

## 2014-06-03 SURGERY — BYPASS GRAFT FEMORAL-POPLITEAL ARTERY
Anesthesia: General | Site: Leg Lower | Laterality: Right

## 2014-06-06 ENCOUNTER — Telehealth (HOSPITAL_COMMUNITY): Payer: Self-pay

## 2014-06-06 NOTE — Telephone Encounter (Signed)
Encounter complete. 

## 2014-06-11 ENCOUNTER — Ambulatory Visit (HOSPITAL_COMMUNITY)
Admission: RE | Admit: 2014-06-11 | Discharge: 2014-06-11 | Disposition: A | Discharge status: Home / Self Care | Payer: Medicare PPO | Source: Ambulatory Visit | Attending: Cardiovascular Disease | Admitting: Cardiovascular Disease

## 2014-06-11 DIAGNOSIS — I739 Peripheral vascular disease, unspecified: Secondary | ICD-10-CM | POA: Diagnosis not present

## 2014-06-11 DIAGNOSIS — Z87891 Personal history of nicotine dependence: Secondary | ICD-10-CM | POA: Insufficient documentation

## 2014-06-11 DIAGNOSIS — Z01810 Encounter for preprocedural cardiovascular examination: Secondary | ICD-10-CM

## 2014-06-11 DIAGNOSIS — R5383 Other fatigue: Secondary | ICD-10-CM | POA: Insufficient documentation

## 2014-06-11 DIAGNOSIS — R0609 Other forms of dyspnea: Secondary | ICD-10-CM | POA: Insufficient documentation

## 2014-06-11 DIAGNOSIS — J449 Chronic obstructive pulmonary disease, unspecified: Secondary | ICD-10-CM | POA: Diagnosis not present

## 2014-06-11 DIAGNOSIS — Z8249 Family history of ischemic heart disease and other diseases of the circulatory system: Secondary | ICD-10-CM | POA: Diagnosis not present

## 2014-06-11 DIAGNOSIS — Z01818 Encounter for other preprocedural examination: Secondary | ICD-10-CM

## 2014-06-11 MED ORDER — TECHNETIUM TC 99M SESTAMIBI GENERIC - CARDIOLITE
30.2000 | Freq: Once | INTRAVENOUS | Status: AC | PRN
  Administered 2014-06-11: 30.2 via INTRAVENOUS

## 2014-06-11 MED ORDER — REGADENOSON 0.4 MG/5ML IV SOLN
0.4000 mg | Freq: Once | INTRAVENOUS | Status: AC
Start: 2014-06-11 — End: 2014-06-11
  Administered 2014-06-11: 0.4 mg via INTRAVENOUS

## 2014-06-11 MED ORDER — AMINOPHYLLINE 25 MG/ML IV SOLN
100.0000 mg | Freq: Once | INTRAVENOUS | Status: AC
  Administered 2014-06-11: 100 mg via INTRAVENOUS

## 2014-06-11 MED ORDER — TECHNETIUM TC 99M SESTAMIBI GENERIC - CARDIOLITE
10.7000 | Freq: Once | INTRAVENOUS | Status: AC | PRN
  Administered 2014-06-11: 11 via INTRAVENOUS

## 2014-06-11 NOTE — Procedures (Addendum)
[facility] [address] [place] [address] [phone]  Cardiology Nuclear Med Study  Ashley MarinerJerald J Branscum is a 73 y.o. male     MRN : [id number]     DOB: [birth year]  Procedure Date: 06/11/2014  Nuclear Med Background Indication for Stress Test:  Surgical Clearance and Post Hospital History:  COPD and angioplasty;No prior NUC MPI for comparison. Cardiac Risk Factors: Family History - CAD, History of Smoking and PVD  Symptoms:  DOE and Fatigue   Nuclear Pre-Procedure Caffeine/Decaff Intake:  1:00am NPO After: 11am   IV Site: R Forearm  IV 0.9% NS with Angio Cath:  22g  Chest Size (in):  42"  IV Started by: Berdie OgrenAmanda Wease, RN  Height: 6\' 4"  (1.93 m)  Cup Size: n/a  BMI:  Body mass index is 21.68 kg/(m^2). Weight:  178 lb (80.74 kg)   Tech Comments:  n/a    Nuclear Med Study 1 or 2 day study: 1 day  Stress Test Type:  Lexiscan  Order Authorizing Provider:  Nanetta BattyJonathan Berry, MD   Resting Radionuclide: Technetium 6065m Sestamibi  Resting Radionuclide Dose: 10.7 mCi   Stress Radionuclide:  Technetium 4465m Sestamibi  Stress Radionuclide Dose: 30.2 mCi           Stress Protocol Rest HR: 64 Stress HR: 86  Rest BP: 120/72 Stress BP: 122/64  Exercise Time (min): n/a METS: n/a          Dose of Adenosine (mg):  n/a Dose of Lexiscan: 0.4 mg  Dose of Atropine (mg): n/a Dose of Dobutamine: n/a mcg/kg/min (at max HR)  Stress Test Technologist: Ernestene MentionGwen Farrington, CCT Nuclear Technologist: Gonzella LexPam Phillips, CNMT   Rest Procedure:  Myocardial perfusion imaging was performed at rest 45 minutes following the intravenous administration of Technetium 6965m Sestamibi. Stress Procedure:  The patient received IV Lexiscan 0.4 mg over 15-seconds.  Technetium 6965m Sestamibi injected IV at 30-seconds.  Patient experienced shortness of breath, light-headedness and was administered 100 mg of Aminophylline IV. There were no significant changes with Lexiscan.   Quantitative spect images were obtained after a 45 minute delay.  Transient Ischemic Dilatation (Normal <1.22):  1.09 QGS EDV:  130 ml QGS ESV:  67 ml LV Ejection Fraction: 49%      Rest ECG: NSR - Normal EKG  Stress ECG: No significant change from baseline ECG  QPS Raw Data Images:  Normal; no motion artifact; normal heart/lung ratio. Stress Images:  There is decreased uptake in the basal/mid inferior wall and inferior septum. The defect is moderate in size and mild in severity Rest Images:  Comparison with the stress images reveals no significant change. Subtraction (SDS):  There is a fixed defect that is most consistent with a previous infarction. LV Wall Motion:  mild inferoseptal and inferior hypokinesis and mildly reduced overall systolic function  Impression Exercise Capacity:  Lexiscan with no exercise. BP Response:  Normal blood pressure response. Clinical Symptoms:  No significant symptoms noted. ECG Impression:  No significant ST segment change suggestive of ischemia. 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   Oneal Schoenberger, MD  06/11/2014 5:23 PM

## 2014-06-17 ENCOUNTER — Encounter: Payer: Self-pay | Admitting: *Deleted

## 2014-06-27 ENCOUNTER — Encounter (HOSPITAL_COMMUNITY): Payer: Self-pay | Admitting: Cardiovascular Disease

## 2014-07-23 ENCOUNTER — Ambulatory Visit (INDEPENDENT_AMBULATORY_CARE_PROVIDER_SITE_OTHER): Payer: Medicare PPO | Admitting: Cardiovascular Disease

## 2014-07-23 ENCOUNTER — Encounter: Payer: Self-pay | Admitting: Cardiovascular Disease

## 2014-07-23 VITALS — BP 122/60 | HR 72 | Ht 76.0 in | Wt 179.0 lb

## 2014-07-23 DIAGNOSIS — I739 Peripheral vascular disease, unspecified: Secondary | ICD-10-CM

## 2014-07-23 DIAGNOSIS — I998 Other disorder of circulatory system: Secondary | ICD-10-CM

## 2014-07-23 DIAGNOSIS — Z79899 Other long term (current) drug therapy: Secondary | ICD-10-CM

## 2014-07-23 DIAGNOSIS — I70229 Atherosclerosis of native arteries of extremities with rest pain, unspecified extremity: Secondary | ICD-10-CM

## 2014-07-23 NOTE — Progress Notes (Signed)
07/23/2014 Barton DuboisJerald J Beller   Jun 17, 1941  [id number]  Primary Physician Millsaps, Joelene MillinKIMBERLY M, NP Primary Cardiologist: Runell GessJonathan J. Dionisio Aragones MD Roseanne RenoFACP,FACC,FAHA, FSCAI   HPI:  Mr. Caleb Hays is a 74 year old thin appearing single Caucasian male with no children who is accompanied by his niece today. He was referred by Tally JoeKim Millsap for peripheral vascular evaluation. I last saw him in the office 05/31/14. He is retired Corporate investment bankerconstruction worker. His cardiovascular risk factors are notable for 60 pack years having quit 2 weeks ago. He also quit taking alcohol 2 years ago at which time he was drinking 6-8 beers a day. He has never had a heart attack or stroke, denies chest pain shortness of breath or claudication. He had a gangrenous right fifth toe with osteomyelitis and underwent amputation by Dr. Lajoyce Cornersuda back in August and apparently again earlier this month. He had lower extremity arterial Doppler studies performed in our office 03/20/14 revealing ABIs in the 0.5 range bilaterally with occluded superficial femoral arteries and popliteal arteries bilaterally and one-vessel runoff on the right. I angiogram him on 05/27/14 confirming his anatomy with occluded superficial femoral arteries from the origin down to the below-the-knee popliteal artery with reconstitution of the anterior tibial and peroneal arteries. He was seen in consultation by Dr. Darrick PennaFields who graciously arranged expedited right fem-tib bypass grafting this coming Monday. Apparently vein mapping did not reveal any suitable venous conduit and therefore PTFE would be required.since I saw him in the office several months ago his wound is almost E healed. He did have a Myoview stress test performed on 05/31/14 which was low risk.  Current Outpatient Prescriptions  Medication Sig Dispense Refill  . allopurinol (ZYLOPRIM) 300 MG tablet Take 300 mg by mouth daily.     . nitroGLYCERIN (NITRODUR - DOSED IN MG/24 HR) 0.2 mg/hr patch Place 0.2 mg onto the skin daily.      No current facility-administered medications for this visit.    No Known Allergies  History   Social History  . Marital Status: Single    Spouse Name: N/A    Number of Children: N/A  . Years of Education: N/A   Occupational History  . Not on file.   Social History Main Topics  . Smoking status: Former Smoker -- 0.25 packs/day for 50 years    Types: Cigarettes    Quit date: 04/29/2014  . Smokeless tobacco: Never Used  . Alcohol Use: No     Comment: quit drinking around 2013  . Drug Use: No  . Sexual Activity: Not on file   Other Topics Concern  . Not on file   Social History Narrative     Review of Systems: General: negative for chills, fever, night sweats or weight changes.  Cardiovascular: negative for chest pain, dyspnea on exertion, edema, orthopnea, palpitations, paroxysmal nocturnal dyspnea or shortness of breath Dermatological: negative for rash Respiratory: negative for cough or wheezing Urologic: negative for hematuria Abdominal: negative for nausea, vomiting, diarrhea, bright red blood per rectum, melena, or hematemesis Neurologic: negative for visual changes, syncope, or dizziness All other systems reviewed and are otherwise negative except as noted above.    Blood pressure 122/60, pulse 72, height 6\' 4"  (1.93 m), weight 179 lb (81.194 kg).  General appearance: alert and no distress Neck: no adenopathy, no carotid bruit, no JVD, supple, symmetrical, trachea midline and thyroid not enlarged, symmetric, no tenderness/mass/nodules Lungs: clear to auscultation bilaterally Heart: regular rate and rhythm, S1, S2 normal, no murmur, click, rub or  gallop Extremities: extremities normal, atraumatic, no cyanosis or edema  EKG not performed today  ASSESSMENT AND PLAN:   Critical lower limb ischemia The patient has a history of PAD status post angiography by myself 05/27/2014 revealing occluded SFAs and the origin down to below knee popliteal artery and  proximal tibials. He had a slowly healing wound on his right foot which has since healed. He was scheduled to have them to tip bypass by Dr. Leonette Most fields using a prosthetic conduit however because the wound was healing I delayed the surgery ultimately was canceled. He actually now denies claudication.      Runell Gess MD FACP,FACC,FAHA, [facility] 07/23/2014 2:29 PM

## 2014-07-23 NOTE — Patient Instructions (Signed)
Dr Berry has ordered the following test(s) to be done: 1. Blood work - to be done FASTING  Dr Berry wants you to follow-up in 1 year. You will receive a reminder letter in the mail one months in advance. If you don't receive a letter, please call our office to schedule the follow-up appointment. 

## 2014-07-23 NOTE — Assessment & Plan Note (Signed)
The patient has a history of PAD status post angiography by myself 05/27/2014 revealing occluded SFAs and the origin down to below knee popliteal artery and proximal tibials. He had a slowly healing wound on his right foot which has since healed. He was scheduled to have them to tip bypass by Dr. Leonette Mostharles fields using a prosthetic conduit however because the wound was healing I delayed the surgery ultimately was canceled. He actually now denies claudication.

## 2015-06-23 ENCOUNTER — Inpatient Hospital Stay (HOSPITAL_COMMUNITY): Payer: Medicare HMO

## 2015-06-23 ENCOUNTER — Emergency Department (HOSPITAL_COMMUNITY): Payer: Medicare HMO

## 2015-06-23 ENCOUNTER — Inpatient Hospital Stay (HOSPITAL_COMMUNITY)
Admission: EM | Admit: 2015-06-23 | Discharge: 2015-06-26 | DRG: 872 | Disposition: A | Discharge status: Home / Self Care | Payer: Medicare HMO | Attending: Internal Medicine | Admitting: Internal Medicine

## 2015-06-23 ENCOUNTER — Encounter (HOSPITAL_COMMUNITY): Payer: Self-pay | Admitting: Emergency Medicine

## 2015-06-23 DIAGNOSIS — R0902 Hypoxemia: Secondary | ICD-10-CM | POA: Diagnosis present

## 2015-06-23 DIAGNOSIS — E860 Dehydration: Secondary | ICD-10-CM | POA: Diagnosis present

## 2015-06-23 DIAGNOSIS — L8990 Pressure ulcer of unspecified site, unspecified stage: Secondary | ICD-10-CM | POA: Diagnosis present

## 2015-06-23 DIAGNOSIS — E872 Acidosis, unspecified: Secondary | ICD-10-CM | POA: Insufficient documentation

## 2015-06-23 DIAGNOSIS — J449 Chronic obstructive pulmonary disease, unspecified: Secondary | ICD-10-CM | POA: Diagnosis present

## 2015-06-23 DIAGNOSIS — R627 Adult failure to thrive: Secondary | ICD-10-CM | POA: Diagnosis present

## 2015-06-23 DIAGNOSIS — F17200 Nicotine dependence, unspecified, uncomplicated: Secondary | ICD-10-CM | POA: Diagnosis present

## 2015-06-23 DIAGNOSIS — A419 Sepsis, unspecified organism: Secondary | ICD-10-CM | POA: Diagnosis present

## 2015-06-23 DIAGNOSIS — N390 Urinary tract infection, site not specified: Secondary | ICD-10-CM | POA: Diagnosis present

## 2015-06-23 DIAGNOSIS — N12 Tubulo-interstitial nephritis, not specified as acute or chronic: Secondary | ICD-10-CM | POA: Diagnosis present

## 2015-06-23 DIAGNOSIS — R0600 Dyspnea, unspecified: Secondary | ICD-10-CM | POA: Diagnosis not present

## 2015-06-23 DIAGNOSIS — E440 Moderate protein-calorie malnutrition: Secondary | ICD-10-CM | POA: Insufficient documentation

## 2015-06-23 DIAGNOSIS — R634 Abnormal weight loss: Secondary | ICD-10-CM | POA: Diagnosis present

## 2015-06-23 DIAGNOSIS — N179 Acute kidney failure, unspecified: Secondary | ICD-10-CM | POA: Diagnosis present

## 2015-06-23 DIAGNOSIS — L89152 Pressure ulcer of sacral region, stage 2: Secondary | ICD-10-CM | POA: Diagnosis present

## 2015-06-23 DIAGNOSIS — J42 Unspecified chronic bronchitis: Secondary | ICD-10-CM

## 2015-06-23 DIAGNOSIS — W19XXXA Unspecified fall, initial encounter: Secondary | ICD-10-CM | POA: Diagnosis not present

## 2015-06-23 DIAGNOSIS — Z720 Tobacco use: Secondary | ICD-10-CM | POA: Diagnosis present

## 2015-06-23 HISTORY — DX: Depression, unspecified: F32.A

## 2015-06-23 HISTORY — DX: Unspecified osteoarthritis, unspecified site: M19.90

## 2015-06-23 HISTORY — DX: Adult failure to thrive: R62.7

## 2015-06-23 HISTORY — DX: Major depressive disorder, single episode, unspecified: F32.9

## 2015-06-23 HISTORY — DX: Tubulo-interstitial nephritis, not specified as acute or chronic: N12

## 2015-06-23 LAB — COMPREHENSIVE METABOLIC PANEL
ALT: 13 U/L — ABNORMAL LOW (ref 17–63)
AST: 26 U/L (ref 15–41)
Albumin: 2.8 g/dL — ABNORMAL LOW (ref 3.5–5.0)
Alkaline Phosphatase: 53 U/L (ref 38–126)
Anion gap: 12 (ref 5–15)
BILIRUBIN TOTAL: 0.9 mg/dL (ref 0.3–1.2)
BUN: 52 mg/dL — ABNORMAL HIGH (ref 6–20)
CALCIUM: 8.5 mg/dL — AB (ref 8.9–10.3)
CHLORIDE: 103 mmol/L (ref 101–111)
CO2: 20 mmol/L — ABNORMAL LOW (ref 22–32)
Creatinine, Ser: 3.21 mg/dL — ABNORMAL HIGH (ref 0.61–1.24)
GFR calc non Af Amer: 18 mL/min — ABNORMAL LOW (ref >60)
GFR, EST AFRICAN AMERICAN: 21 mL/min — AB (ref >60)
Glucose, Bld: 114 mg/dL — ABNORMAL HIGH (ref 65–99)
Potassium: 5 mmol/L (ref 3.5–5.1)
Sodium: 135 mmol/L (ref 135–145)
TOTAL PROTEIN: 6.1 g/dL — AB (ref 6.5–8.1)

## 2015-06-23 LAB — CBC WITH DIFFERENTIAL/PLATELET
BASOS PCT: 0 %
Basophils Absolute: 0 10*3/uL (ref 0.0–0.1)
EOS PCT: 0 %
Eosinophils Absolute: 0 10*3/uL (ref 0.0–0.7)
HCT: 30.3 % — ABNORMAL LOW (ref 39.0–52.0)
Hemoglobin: 9.7 g/dL — ABNORMAL LOW (ref 13.0–17.0)
LYMPHS ABS: 1.4 10*3/uL (ref 0.7–4.0)
Lymphocytes Relative: 10 %
MCH: 22.7 pg — ABNORMAL LOW (ref 26.0–34.0)
MCHC: 32 g/dL (ref 30.0–36.0)
MCV: 70.8 fL — ABNORMAL LOW (ref 78.0–100.0)
MONOS PCT: 17 %
Monocytes Absolute: 2.4 10*3/uL — ABNORMAL HIGH (ref 0.1–1.0)
NEUTROS PCT: 73 %
Neutro Abs: 10.1 10*3/uL — ABNORMAL HIGH (ref 1.7–7.7)
PLATELETS: 194 10*3/uL (ref 150–400)
RBC: 4.28 MIL/uL (ref 4.22–5.81)
RDW: 17.2 % — AB (ref 11.5–15.5)
WBC: 13.9 10*3/uL — AB (ref 4.0–10.5)

## 2015-06-23 LAB — URINALYSIS, ROUTINE W REFLEX MICROSCOPIC
BILIRUBIN URINE: NEGATIVE
GLUCOSE, UA: NEGATIVE mg/dL
KETONES UR: NEGATIVE mg/dL
Nitrite: NEGATIVE
PH: 6 (ref 5.0–8.0)
Protein, ur: 100 mg/dL — AB
Specific Gravity, Urine: 1.012 (ref 1.005–1.030)

## 2015-06-23 LAB — I-STAT CG4 LACTIC ACID, ED
LACTIC ACID, VENOUS: 2.03 mmol/L — AB (ref 0.5–2.0)
Lactic Acid, Venous: 2.29 mmol/L (ref 0.5–2.0)

## 2015-06-23 LAB — I-STAT TROPONIN, ED
TROPONIN I, POC: 0.03 ng/mL (ref 0.00–0.08)
TROPONIN I, POC: 0.04 ng/mL (ref 0.00–0.08)
Troponin i, poc: 0.04 ng/mL (ref 0.00–0.08)

## 2015-06-23 LAB — URINE MICROSCOPIC-ADD ON: Squamous Epithelial / LPF: NONE SEEN

## 2015-06-23 LAB — POC OCCULT BLOOD, ED: Fecal Occult Bld: NEGATIVE

## 2015-06-23 LAB — CK: Total CK: 784 U/L — ABNORMAL HIGH (ref 49–397)

## 2015-06-23 MED ORDER — ALBUTEROL SULFATE (2.5 MG/3ML) 0.083% IN NEBU: 2.5000 mg | INHALATION_SOLUTION | RESPIRATORY_TRACT | Status: DC | PRN

## 2015-06-23 MED ORDER — ZOLPIDEM TARTRATE 5 MG PO TABS
5.0000 mg | ORAL_TABLET | Freq: Once | ORAL | Status: AC
  Administered 2015-06-23: 5 mg via ORAL
  Filled 2015-06-23: qty 1

## 2015-06-23 MED ORDER — ASPIRIN EC 81 MG PO TBEC
81.0000 mg | DELAYED_RELEASE_TABLET | Freq: Every day | ORAL | Status: DC
  Administered 2015-06-23 – 2015-06-26 (×4): 81 mg via ORAL
  Filled 2015-06-23 (×4): qty 1

## 2015-06-23 MED ORDER — HEPARIN SODIUM (PORCINE) 5000 UNIT/ML IJ SOLN
5000.0000 [IU] | Freq: Three times a day (TID) | INTRAMUSCULAR | Status: DC
  Administered 2015-06-23 – 2015-06-26 (×8): 5000 [IU] via SUBCUTANEOUS
  Filled 2015-06-23 (×8): qty 1

## 2015-06-23 MED ORDER — CEFTRIAXONE SODIUM 1 G IJ SOLR
1.0000 g | Freq: Once | INTRAMUSCULAR | Status: AC
  Administered 2015-06-23: 1 g via INTRAVENOUS
  Filled 2015-06-23: qty 10

## 2015-06-23 MED ORDER — SODIUM CHLORIDE 0.9 % IV SOLN
INTRAVENOUS | Status: AC
  Administered 2015-06-23: 23:00:00 via INTRAVENOUS

## 2015-06-23 MED ORDER — SODIUM CHLORIDE 0.9 % IV BOLUS (SEPSIS)
1000.0000 mL | Freq: Once | INTRAVENOUS | Status: AC
  Administered 2015-06-23: 1000 mL via INTRAVENOUS

## 2015-06-23 MED ORDER — ENSURE ENLIVE PO LIQD
237.0000 mL | Freq: Two times a day (BID) | ORAL | Status: DC
  Administered 2015-06-24: 237 mL via ORAL

## 2015-06-23 MED ORDER — PNEUMOCOCCAL VAC POLYVALENT 25 MCG/0.5ML IJ INJ: 0.5000 mL | INJECTION | INTRAMUSCULAR | Status: DC

## 2015-06-23 MED ORDER — INFLUENZA VAC SPLIT QUAD 0.5 ML IM SUSY: 0.5000 mL | PREFILLED_SYRINGE | INTRAMUSCULAR | Status: DC

## 2015-06-23 MED ORDER — POLYETHYLENE GLYCOL 3350 17 G PO PACK: 17.0000 g | PACK | Freq: Every day | ORAL | Status: DC | PRN

## 2015-06-23 MED ORDER — IPRATROPIUM-ALBUTEROL 0.5-2.5 (3) MG/3ML IN SOLN: 3.0000 mL | Freq: Once | RESPIRATORY_TRACT | Status: DC

## 2015-06-23 MED ORDER — PREDNISONE 20 MG PO TABS
50.0000 mg | ORAL_TABLET | Freq: Every day | ORAL | Status: DC
  Administered 2015-06-24 – 2015-06-25 (×2): 50 mg via ORAL
  Filled 2015-06-23 (×2): qty 2

## 2015-06-23 MED ORDER — NICOTINE 14 MG/24HR TD PT24
14.0000 mg | MEDICATED_PATCH | TRANSDERMAL | Status: DC
  Administered 2015-06-23 – 2015-06-25 (×3): 14 mg via TRANSDERMAL
  Filled 2015-06-23 (×3): qty 1

## 2015-06-23 MED ORDER — ONDANSETRON HCL 4 MG PO TABS: 4.0000 mg | ORAL_TABLET | Freq: Four times a day (QID) | ORAL | Status: DC | PRN

## 2015-06-23 MED ORDER — ALUM & MAG HYDROXIDE-SIMETH 200-200-20 MG/5ML PO SUSP: 30.0000 mL | Freq: Four times a day (QID) | ORAL | Status: DC | PRN

## 2015-06-23 MED ORDER — MORPHINE SULFATE (PF) 2 MG/ML IV SOLN
2.0000 mg | INTRAVENOUS | Status: DC | PRN
  Administered 2015-06-23 – 2015-06-25 (×5): 2 mg via INTRAVENOUS
  Filled 2015-06-23 (×5): qty 1

## 2015-06-23 MED ORDER — SODIUM CHLORIDE 0.9 % IJ SOLN
3.0000 mL | Freq: Two times a day (BID) | INTRAMUSCULAR | Status: DC
  Administered 2015-06-23 – 2015-06-24 (×2): 3 mL via INTRAVENOUS

## 2015-06-23 MED ORDER — ONDANSETRON HCL 4 MG/2ML IJ SOLN: 4.0000 mg | Freq: Four times a day (QID) | INTRAMUSCULAR | Status: DC | PRN

## 2015-06-23 MED ORDER — PIPERACILLIN-TAZOBACTAM 3.375 G IVPB 30 MIN: 3.3750 g | Freq: Once | INTRAVENOUS | Status: DC

## 2015-06-23 MED ORDER — GUAIFENESIN ER 600 MG PO TB12
1200.0000 mg | ORAL_TABLET | Freq: Two times a day (BID) | ORAL | Status: DC
  Administered 2015-06-23 – 2015-06-26 (×6): 1200 mg via ORAL
  Filled 2015-06-23 (×6): qty 2

## 2015-06-23 NOTE — Progress Notes (Signed)
Report received. Pt arrived to room. VSS Bed alarm placed. Call bell within place. Will continue to moniotr closely.  Sandrea HammondJunris Reda Citron RN

## 2015-06-23 NOTE — ED Notes (Addendum)
Per EMS, patient was found this morning around 1100 on the floor by a family member.   Patient states he fell last night and couldn't get back up.   EMS further advised that patient had a fall yesterday also that was unwitnessed.   Patient was found surrounded by feces at home.  Extremities were cold when EMS arrived.  Patient states "I just woke up one the floor, but I don't remember falling".  Per patient's niece, patient has fallen x 2 the last couple of days. Patient states has been incontinent of urine and stool x 6 months with gradual onset.

## 2015-06-23 NOTE — ED Notes (Signed)
MD at bedside. 

## 2015-06-23 NOTE — ED Notes (Signed)
Patient still in radiology.

## 2015-06-23 NOTE — ED Notes (Signed)
Darrel called patient's niece and let her know the room number he is going to.

## 2015-06-23 NOTE — Progress Notes (Signed)
One time stat duoneb ordered; pt not in room upon arrival to administer

## 2015-06-23 NOTE — ED Provider Notes (Signed)
CSN: [id number]     Arrival date & time 06/23/15  1227 History   First MD Initiated Contact with Patient 06/23/15 1229     Chief Complaint  Patient presents with  . Weakness     (Consider location/radiation/quality/duration/timing/severity/associated sxs/prior Treatment) HPI Comments: Fall yesterday, difficulty getting up but was able to Intermittently seems confused Not himself, not eating or drinking well Yesterday did not want to come to hospital Niece and her husband checked on pt this AM when he didn't come out of his room and found him down on the floor, covered in feces and urine Pt reports falling and not able to get up He does not remember how he fell/how he got on floor.  ?syncope No neck pain, no numbness/weakness +Cough +Dysuria, yesterday was urinating every 30 min, today was not urinating nor did he feel th urge No fevers Occasional vertigo, not now    Patient is a 74 y.o. male presenting with weakness.  Weakness This is a new problem. The current episode started yesterday. The problem occurs constantly. The problem has not changed since onset.Pertinent negatives include no chest pain, no abdominal pain, no headaches and no shortness of breath. Nothing aggravates the symptoms. Nothing relieves the symptoms. He has tried nothing for the symptoms. The treatment provided no relief.    Past Medical History  Diagnosis Date  . Gout   . COPD (chronic obstructive pulmonary disease) (HCC)     LONG TIME SMOKER  . 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 Lajoyce Cornersuda   . Critical lower limb ischemia    Past Surgical History  Procedure Laterality Date  . Knee surgery Left     football injury  . Amputation Right 03/01/2014    Procedure: AMPUTATION RAY;  Surgeon: Nadara MustardMarcus Duda V, MD;  [facility];  Service: Orthopedics;  Laterality: Right;  Right Foot 5th Ray Amputation  . Amputation Right 04/26/2014    Procedure: Right Foot 4th Ray Amputation;  Surgeon:  Nadara MustardMarcus Duda V, MD;  [facility];  Service: Orthopedics;  Laterality: Right;  . Lower extremity angiogram N/A 05/27/2014    Procedure: LOWER EXTREMITY ANGIOGRAM;  Surgeon: Runell GessJonathan J Berry, MD;  [facility];  Service: Cardiovascular;  Laterality: N/A;   Family History  Problem Relation Age of Onset  . Heart failure Father   . Heart attack Father   . Heart failure Brother    Social History  Substance Use Topics  . Smoking status: Current Every Day Smoker -- 0.50 packs/day for 50 years    Types: Cigarettes  . Smokeless tobacco: Never Used  . Alcohol Use: No     Comment: quit drinking around 2013    Review of Systems  Constitutional: Positive for appetite change and fatigue. Negative for fever.  HENT: Negative for sore throat.   Eyes: Negative for visual disturbance.  Respiratory: Positive for cough. Negative for shortness of breath.   Cardiovascular: Negative for chest pain and leg swelling.  Gastrointestinal: Negative for nausea, vomiting, abdominal pain, diarrhea and blood in stool.  Genitourinary: Positive for dysuria. Negative for difficulty urinating (denies, but states did not have urge today).  Musculoskeletal: Positive for back pain (layed on back all night). Negative for neck stiffness.  Skin: Negative for rash.  Neurological: Positive for dizziness (occasional, not current) and weakness (generalized). Negative for seizures, syncope, facial asymmetry, speech difficulty, numbness and headaches.      Allergies  Review of patient's allergies  indicates no known allergies.  Home Medications   Prior to Admission medications   Medication Sig Start Date End Date Taking? Authorizing Provider  DiphenhydrAMINE HCl (ZZZQUIL) 50 MG/30ML LIQD Take 30 mLs by mouth at bedtime as needed. For sleep   Yes Historical Provider, MD  allopurinol (ZYLOPRIM) 300 MG tablet Take 300 mg by mouth daily.  02/11/14   Historical Provider, MD  nitroGLYCERIN (NITRODUR - DOSED IN MG/24 HR)  0.2 mg/hr patch Place 0.2 mg onto the skin daily.    Historical Provider, MD   BP 118/52 mmHg  Pulse 80  Temp(Src) 98.5 F (36.9 C) (Rectal)  Resp 17  Ht  (1.93 m)  Wt 210 lb (95.255 kg)  BMI 25.57 kg/m2  SpO2 98% Physical Exam  Constitutional: He is oriented to person, place, and time. He appears well-developed and well-nourished. No distress.  HENT:  Head: Normocephalic and atraumatic.  Eyes: Conjunctivae and EOM are normal.  Neck: Normal range of motion.  No midline neck tenderness   Cardiovascular: Normal rate, regular rhythm, normal heart sounds and intact distal pulses.  Exam reveals no gallop and no friction rub.   No murmur heard. Pulmonary/Chest: Effort normal and breath sounds normal. No respiratory distress. He has no wheezes. He has no rales.  Abdominal: Soft. He exhibits no distension. There is no tenderness. There is no guarding.  Genitourinary: Rectal exam shows anal tone normal. Guaiac negative stool. Prostate is enlarged. Prostate is not tender.  Musculoskeletal: He exhibits no edema.       Right hip: He exhibits normal range of motion, normal strength and no bony tenderness.       Left hip: He exhibits normal range of motion, normal strength and no bony tenderness.       Cervical back: He exhibits no tenderness and no bony tenderness.       Thoracic back: He exhibits tenderness and bony tenderness.       Lumbar back: He exhibits tenderness and bony tenderness.  Neurological: He is alert and oriented to person, place, and time. He has normal strength. He displays no tremor. No cranial nerve deficit or sensory deficit. Coordination (initial concern for left arm dysmetria, howeever after repeating movement able to perform normal finger-nose) normal. Abnormal gait: deferred due to generalized weakness/fall risk. GCS eye subscore is 4. GCS verbal subscore is 5. GCS motor subscore is 6.  Skin: Skin is warm and dry. He is not diaphoretic.  Sacral erythema Small ulcer  gluteal cleft superior  Nursing note and vitals reviewed.   ED Course  Procedures (including critical care time) Labs Review Labs Reviewed  CBC WITH DIFFERENTIAL/PLATELET - Abnormal; Notable for the following:    WBC 13.9 (*)    Hemoglobin 9.7 (*)    HCT 30.3 (*)    MCV 70.8 (*)    MCH 22.7 (*)    RDW 17.2 (*)    Neutro Abs 10.1 (*)    Monocytes Absolute 2.4 (*)    All other components within normal limits  COMPREHENSIVE METABOLIC PANEL - Abnormal; Notable for the following:    CO2 20 (*)    Glucose, Bld 114 (*)    BUN 52 (*)    Creatinine, Ser 3.21 (*)    Calcium 8.5 (*)    Total Protein 6.1 (*)    Albumin 2.8 (*)    ALT 13 (*)    GFR calc non Af Amer 18 (*)    GFR calc Af Amer 21 (*)  All other components within normal limits  CK - Abnormal; Notable for the following:    Total CK 784 (*)    All other components within normal limits  URINALYSIS, ROUTINE W REFLEX MICROSCOPIC (NOT AT [facility]) - Abnormal; Notable for the following:    APPearance TURBID (*)    Hgb urine dipstick MODERATE (*)    Protein, ur 100 (*)    Leukocytes, UA LARGE (*)    All other components within normal limits  URINE MICROSCOPIC-ADD ON - Abnormal; Notable for the following:    Bacteria, UA FEW (*)    All other components within normal limits  I-STAT CG4 LACTIC ACID, ED - Abnormal; Notable for the following:    Lactic Acid, Venous 2.03 (*)    All other components within normal limits  URINE CULTURE  CULTURE, BLOOD (ROUTINE X 2)  CULTURE, BLOOD (ROUTINE X 2)  I-STAT TROPOININ, ED  POC OCCULT BLOOD, ED  I-STAT TROPOININ, ED  I-STAT CG4 LACTIC ACID, ED    Imaging Review Dg Chest 1 View  06/23/2015  CLINICAL DATA:  Pain following fall EXAM: CHEST 1 VIEW COMPARISON:  March 01, 2014 FINDINGS: There is no edema or consolidation. The heart size and pulmonary vascularity are normal. No adenopathy. There is atherosclerotic calcification in the aorta. No bone lesions. No pneumothorax. IMPRESSION: No  edema or consolidation. Electronically Signed   By: Bretta Bang III M.D.   On: 06/23/2015 14:41   Dg Thoracic Spine 2 View  06/23/2015  CLINICAL DATA:  Fall 1 day ago with persistent back pain, initial encounter EXAM: THORACIC SPINE 2 VIEWS COMPARISON:  03/01/1949 FINDINGS: Vertebral body height is well maintained. Multilevel degenerative changes are seen. No acute compression deformity is noted. No paraspinal mass lesion is noted. IMPRESSION: Degenerative change without acute abnormality. Electronically Signed   By: Alcide Clever M.D.   On: 06/23/2015 14:48   Dg Lumbar Spine 2-3 Views  06/23/2015  CLINICAL DATA:  Pain following fall EXAM: LUMBAR SPINE - 2-3 VIEW COMPARISON:  None. FINDINGS: Frontal, lateral, and spot lumbosacral lateral images were obtained. The there are 5 non-rib-bearing lumbar type vertebral bodies. There is no fracture or spondylolisthesis. There is moderately severe disc space narrowing at L5-S1. There is mild disc space narrowing at L3-4. There are anterior and lateral osteophytes at all levels. There is extensive atherosclerotic calcification in the aorta and iliac arteries. Calcification is also noted in the splenic artery and peripheral right renal artery. 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 No fracture or spondylolisthesis. Extensive atherosclerotic calcification at multiple sites. Electronically Signed   By: Bretta Bang III M.D.   On: 06/23/2015 14:47   Ct Head Wo Contrast  06/23/2015  CLINICAL DATA:  Altered mental status.  Fall. EXAM: CT HEAD WITHOUT CONTRAST TECHNIQUE: Contiguous axial images were obtained from the base of the skull through the vertex without intravenous contrast. COMPARISON:  None. FINDINGS: There is atrophy and chronic small vessel disease changes. No acute intracranial abnormality. Specifically, no hemorrhage, hydrocephalus, mass lesion, acute infarction, or significant intracranial injury. No acute calvarial abnormality.  IMPRESSION: No acute intracranial abnormality. Electronically Signed   By: Charlett Nose M.D.   On: 06/23/2015 15:40   I have personally reviewed and evaluated these images and lab results as part of my medical decision-making.   EKG Interpretation   Date/Time:  Monday June 23 2015 12:31:15 EST Ventricular Rate:  86 PR Interval:  144 QRS Duration: 92 QT Interval:  389 QTC Calculation: 465 R  Axis:   84 Text Interpretation:  Sinus rhythm Atrial premature complex Borderline  right axis deviation Probable anteroseptal infarct, old Nonspecific T  abnormalities, lateral leads Artifact Confirmed by [facility] MD, Tre Sanker  ([id number]) on 06/23/2015 3:42:41 PM      Limited Ultrasound of bladder  Performed by Dr. Dalene Seltzer Indication: AKI. Patient had catheterization prior to exam and cannot use for urinary retention. Does not feel urge to urinate. Technique:  Low frequency probe utilized in two planes to assess bladder volume in real-time. Findings: Bladder volume 200s Additional findings: No hydronephrosis, small left kidney, +splenomegaly, bladder wall thickening and sediment.   Images were archived electronically   MDM   Final diagnoses:  Fall  Sepsis, due to unspecified organism [facility])  UTI (lower urinary tract infection)  Acute renal failure, unspecified acute renal failure type (HCC)  Lactic acidosis   74 year old male with history of COPD presents with concern for waxing and waning altered mental status, generalized weakness, falls.  Regarding falls, head CT was ordered which showed no acute intracranial abnormalities. X-rays of the thoracic and lumbar spine showed no acute findings. Patient denies any midline neck tenderness, has no neurologic abnormalities on exam, have low suspicion for cervical spine injury. He does not exhibit any other areas of tenderness and have low suspicion for other traumatic injury. He is currently alert and oriented, however family reports that he has been  intermittently confused at home.  Urinalysis was obtained which was concerning for urinary tract infection and patient was given a gram of Rocephin. Given patient was laying on the ground likely overnight, a CK was checked which was 784. Patient does have an acute kidney injury, with possible etiologies including rhabdomyolysis, dehydration, sepsis, or urinary obstruction (although pt denies these symptoms, and urine cath revealed only 300cc of urine.) Exam not consistent with prostatitis.  Patient me admitted to the hospitalist service for concern of sepsis secondary to urinary source, and acute kidney injury.  Limited bedside US was performed showing bladder wall thickening, sediment, no clear hydronephrosis, and concern for splenomegaly.  Patient has formal US ordered to be done as inpatient which is appropriate.     Alvira Monday, MD 06/23/15 [phone]

## 2015-06-23 NOTE — H&P (Signed)
[facility] History and Physical                                                                                    Caleb Hays, is a 74 y.o. male  MRN: [id number]   DOB - [birth year]  Admit Date - 06/23/2015  Outpatient Primary MD for the patient is Millsaps, Joelene MillinKIMBERLY M, NP  Referring Physician:  Dr. Dalene SeltzerSchlossman  Chief Complaint:   Chief Complaint  Patient presents with  . Weakness     HPI  Caleb SavageJerald Hays  is a 74 y.o. male, with COPD, gout, and a history of critical lower limb ischemia, who presents to the ER after being found down and unable to get up by his family.  Mr. Ala DachSeither's family describes his stated age this distance as primarily lying in bed and getting up for food for the past 11-12 years. He has a 55+ pack year smoking history. The past 2-3 weeks he has become more unbalanced. His nephew noticed on Saturday that he was feeling poorly and gave him Day Quil.  He also noticed that his legs were cold and he was eating much less. On Sunday he fell after he lost his balance and his family wanted to bring him to the ER but he refused. During the night the patient must of gotten up and fallen because he was found on the floor this morning by his niece at approximately 11 AM. The patient has no recollection of getting up or falling. He was unable to stand on his own. He was found in a large amount of feces. His nephew notes he has recently become incontinent of urine, his speech has become slurred, and at times has been very confused.  In the emergency department his white count is 13.9, lactic acid is 2.03, creatinine is 3.21 increased from a baseline of 1.0. CK is 784. Urine appears positive for infection. CT head shows no acute abnormality.  Chest x-ray shows no acute infection.  X-rays of the lumbar spine show arthritic change most marked at L5-S1 but no fracture.  Review of Systems  Constitutional: Positive for weight loss.  Eyes: Negative.   Respiratory: Positive for  wheezing.   Cardiovascular: Negative.   Gastrointestinal: Positive for nausea and diarrhea.  Genitourinary: Positive for dysuria.  Musculoskeletal: Positive for back pain and falls.  Neurological: Positive for dizziness, speech change, weakness and headaches.  Endo/Heme/Allergies: Negative.   Psychiatric/Behavioral: Negative.      Past Medical History  Past Medical History  Diagnosis Date  . Gout   . COPD (chronic obstructive pulmonary disease) (HCC)     LONG TIME SMOKER  . 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 Lajoyce Cornersuda   . Critical lower limb ischemia     Past Surgical History  Procedure Laterality Date  . Knee surgery Left     football injury  . Amputation Right 03/01/2014    Procedure: AMPUTATION RAY;  Surgeon: Nadara MustardMarcus Duda V, MD;  [facility];  Service: Orthopedics;  Laterality: Right;  Right Foot 5th Ray Amputation  . Amputation Right 04/26/2014    Procedure: Right Foot 4th Ray  Amputation;  Surgeon: Nadara Mustard, MD;  Location: [facility] OR;  Service: Orthopedics;  Laterality: Right;  . Lower extremity angiogram N/A 05/27/2014    Procedure: LOWER EXTREMITY ANGIOGRAM;  Surgeon: Runell Gess, MD;  Location: [facility] CATH LAB;  Service: Cardiovascular;  Laterality: N/A;      Social History Social History  Substance Use Topics  . Smoking status: Current Every Day Smoker -- 0.50 packs/day for 50 years    Types: Cigarettes  . Smokeless tobacco: Never Used  . Alcohol Use: No     Comment: quit drinking around 2013   lives with his niece and nephew. Has become dependent with ADLs recently. But prior to this illness was able to ambulate with no assistance.  Family History Family History  Problem Relation Age of Onset  . Heart failure Father   . Heart attack Father   . Heart failure Brother     Prior to Admission medications   Medication Sig Start Date End Date Taking? Authorizing Provider  DiphenhydrAMINE HCl (ZZZQUIL) 50 MG/30ML LIQD Take 30 mLs by  mouth at bedtime as needed. For sleep   Yes Historical Provider, MD  allopurinol (ZYLOPRIM) 300 MG tablet Take 300 mg by mouth daily.  02/11/14   Historical Provider, MD  nitroGLYCERIN (NITRODUR - DOSED IN MG/24 HR) 0.2 mg/hr patch Place 0.2 mg onto the skin daily.    Historical Provider, MD    No Known Allergies  Physical Exam  Vitals  Blood pressure 112/65, pulse 87, temperature 98.5 F (36.9 C), temperature source Rectal, resp. rate 16, height  (1.93 m), weight 95.255 kg (210 lb), SpO2 99 %.   General: Chronically ill-appearing, cachectically thin, very hard of hearing male lying in bed.  Family at bedside  Psych:  Normal affect and insight, Not Suicidal or Homicidal, Awake Alert, Oriented X 3.  Neuro:   No F.N deficits, ALL C.Nerves Intact, Strength 5/5 all 4 extremities, Sensation intact all 4 extremities.  ENT:  Left eye is partially obscured by green exudate, extremely dry oral mucosa. Black spots on his tongue  Neck:  Supple, No lymphadenopathy appreciated  Respiratory:  Minimal bilateral wheeze, O2 sat drops to low 50s when patient exerts himself to sit up. Nonproductive cough  Cardiac:  RRR, No Murmurs, no LE edema noted, no JVD.    Abdomen:  Positive bowel sounds, Soft, Non tender, Non distended,  No masses appreciated  Skin:  Pallor, mottling on the hands.  Skin breakdown in the sacral area.  Extremities:  Able to move all 4. 5/5 strength in each,  no effusions.  Data Review  Wt Readings from Last 3 Encounters:  06/23/15 95.255 kg (210 lb)  07/23/14 81.194 kg (179 lb)  06/11/14 80.74 kg (178 lb)    CBC  Recent Labs Lab 06/23/15 1354  WBC 13.9*  HGB 9.7*  HCT 30.3*  PLT 194  MCV 70.8*  MCH 22.7*  MCHC 32.0  RDW 17.2*  LYMPHSABS 1.4  MONOABS 2.4*  EOSABS 0  BASOSABS 0    Chemistries   Recent Labs Lab 06/23/15 1354  NA 135  K 5.0  CL 103  CO2 20*  GLUCOSE 114*  BUN 52*  CREATININE 3.21*  CALCIUM 8.5*  AST 26  ALT 13*  ALKPHOS  53  BILITOT 0.9     CREATININE: 3.21 mg/dL ABNORMAL (96/04/54 0981) Estimated creatinine clearance - 25.2 mL/min    Urinalysis    Component Value Date/Time   COLORURINE YELLOW 06/23/2015 1332  APPEARANCEUR TURBID* 06/23/2015 1332   LABSPEC 1.012 06/23/2015 1332   PHURINE 6.0 06/23/2015 1332   GLUCOSEU NEGATIVE 06/23/2015 1332   HGBUR MODERATE* 06/23/2015 1332   BILIRUBINUR NEGATIVE 06/23/2015 1332   KETONESUR NEGATIVE 06/23/2015 1332   PROTEINUR 100* 06/23/2015 1332   UROBILINOGEN >8.0* 05/14/2012 1805   NITRITE NEGATIVE 06/23/2015 1332   LEUKOCYTESUR LARGE* 06/23/2015 1332    Imaging results:   Dg Chest 1 View  06/23/2015  CLINICAL DATA:  Pain following fall EXAM: CHEST 1 VIEW COMPARISON:  March 01, 2014 FINDINGS: There is no edema or consolidation. The heart size and pulmonary vascularity are normal. No adenopathy. There is atherosclerotic calcification in the aorta. No bone lesions. No pneumothorax. IMPRESSION: No edema or consolidation. Electronically Signed   By: Bretta Bang III M.D.   On: 06/23/2015 14:41   Dg Thoracic Spine 2 View  06/23/2015  CLINICAL DATA:  Fall 1 day ago with persistent back pain, initial encounter EXAM: THORACIC SPINE 2 VIEWS COMPARISON:  03/01/1949 FINDINGS: Vertebral body height is well maintained. Multilevel degenerative changes are seen. No acute compression deformity is noted. No paraspinal mass lesion is noted. IMPRESSION: Degenerative change without acute abnormality. Electronically Signed   By: Alcide Clever M.D.   On: 06/23/2015 14:48   Dg Lumbar Spine 2-3 Views  06/23/2015  CLINICAL DATA:  Pain following fall EXAM: LUMBAR SPINE - 2-3 VIEW COMPARISON:  None. FINDINGS: Frontal, lateral, and spot lumbosacral lateral images were obtained. The there are 5 non-rib-bearing lumbar type vertebral bodies. There is no fracture or spondylolisthesis. There is moderately severe disc space narrowing at L5-S1. There is mild disc space narrowing at  L3-4. There are anterior and lateral osteophytes at all levels. There is extensive atherosclerotic calcification in the aorta and iliac arteries. Calcification is also noted in the splenic artery and peripheral right renal artery. 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 No fracture or spondylolisthesis. Extensive atherosclerotic calcification at multiple sites. Electronically Signed   By: Bretta Bang III M.D.   On: 06/23/2015 14:47   Ct Head Wo Contrast  06/23/2015  CLINICAL DATA:  Altered mental status.  Fall. EXAM: CT HEAD WITHOUT CONTRAST TECHNIQUE: Contiguous axial images were obtained from the base of the skull through the vertex without intravenous contrast. COMPARISON:  None. FINDINGS: There is atrophy and chronic small vessel disease changes. No acute intracranial abnormality. Specifically, no hemorrhage, hydrocephalus, mass lesion, acute infarction, or significant intracranial injury. No acute calvarial abnormality. IMPRESSION: No acute intracranial abnormality. Electronically Signed   By: Charlett Nose M.D.   On: 06/23/2015 15:40    My personal review of EKG: Sinus rhythm with PACs, borderline QT prolongation.   Assessment & Plan  Principal Problem:   Pyelonephritis Active Problems:   Acute renal failure (ARF) (HCC)   UTI (lower urinary tract infection)   Failure to thrive in adult   Weight loss   Tobacco abuse   Hypoxia   Dehydration, moderate   COPD (chronic obstructive pulmonary disease) (HCC)   Pyelonephritis with an elevated lactic acid and white count Patient received 3 L of IV fluids in the emergency department and was started on Rocephin. We'll continue IV fluids at 100 mL an hour.  Elevated lactic acidosis likely due to dehydration. Urine cultures and blood cultures are pending.  We'll check renal ultrasound. Follow lactic acid. I believe his infection is likely causing his lack of balance and confusion/altered mental status.  Failure to thrive  with subjective weight loss. Patient is cachectic.  Nephew believes he has lost significant amount of weight. We'll ask for nutrition consultation.  Albumin is 2.8.  COPD with ongoing tobacco abuse and hypoxia on exertion. Oxygen saturation dropped to 50s on exertion. When necessary oxygen ordered. Discuss tobacco cessation. Ordered nicotine patch. We'll place on prednisone, order when necessary nebulizers, Mucinex.  Disposition: PT and OT evaluations requested. Patient will likely need home health services at a minimum.    Consultants Called:    None  Family Communication:     Niece and nephew at bedside  Code Status:    Full code  Condition:    Guarded  Potential Disposition:   To home when improved. Estimated 3-4 days.  Time spent in minutes : [id number]   [facility] Algis Downs,  [place] on 06/23/2015 at 4:44 PM Between 7am to 7pm - Pager - [phone] After 7pm go to [email or web address] - password [email or web address] And look for the night coverage person covering me after hours

## 2015-06-24 ENCOUNTER — Inpatient Hospital Stay (HOSPITAL_COMMUNITY): Payer: Medicare HMO

## 2015-06-24 DIAGNOSIS — E440 Moderate protein-calorie malnutrition: Secondary | ICD-10-CM | POA: Insufficient documentation

## 2015-06-24 DIAGNOSIS — L8990 Pressure ulcer of unspecified site, unspecified stage: Secondary | ICD-10-CM | POA: Insufficient documentation

## 2015-06-24 DIAGNOSIS — R0600 Dyspnea, unspecified: Secondary | ICD-10-CM

## 2015-06-24 DIAGNOSIS — R627 Adult failure to thrive: Secondary | ICD-10-CM

## 2015-06-24 LAB — BASIC METABOLIC PANEL
Anion gap: 8 (ref 5–15)
BUN: 49 mg/dL — ABNORMAL HIGH (ref 6–20)
CHLORIDE: 109 mmol/L (ref 101–111)
CO2: 19 mmol/L — ABNORMAL LOW (ref 22–32)
CREATININE: 2.78 mg/dL — AB (ref 0.61–1.24)
Calcium: 7.8 mg/dL — ABNORMAL LOW (ref 8.9–10.3)
GFR, EST AFRICAN AMERICAN: 24 mL/min — AB (ref >60)
GFR, EST NON AFRICAN AMERICAN: 21 mL/min — AB (ref >60)
Glucose, Bld: 86 mg/dL (ref 65–99)
POTASSIUM: 4.7 mmol/L (ref 3.5–5.1)
SODIUM: 136 mmol/L (ref 135–145)

## 2015-06-24 LAB — CBC
HEMATOCRIT: 26.5 % — AB (ref 39.0–52.0)
Hemoglobin: 8.4 g/dL — ABNORMAL LOW (ref 13.0–17.0)
MCH: 22.5 pg — ABNORMAL LOW (ref 26.0–34.0)
MCHC: 31.7 g/dL (ref 30.0–36.0)
MCV: 71 fL — ABNORMAL LOW (ref 78.0–100.0)
Platelets: 163 10*3/uL (ref 150–400)
RBC: 3.73 MIL/uL — ABNORMAL LOW (ref 4.22–5.81)
RDW: 17.5 % — AB (ref 11.5–15.5)
WBC: 6.9 10*3/uL (ref 4.0–10.5)

## 2015-06-24 LAB — PATHOLOGIST SMEAR REVIEW

## 2015-06-24 LAB — CK: Total CK: 545 U/L — ABNORMAL HIGH (ref 49–397)

## 2015-06-24 MED ORDER — DEXTROSE 5 % IV SOLN
1.0000 g | INTRAVENOUS | Status: DC
  Administered 2015-06-24 – 2015-06-25 (×2): 1 g via INTRAVENOUS
  Filled 2015-06-24 (×3): qty 10

## 2015-06-24 MED ORDER — ENSURE ENLIVE PO LIQD
237.0000 mL | Freq: Three times a day (TID) | ORAL | Status: DC
  Administered 2015-06-24 – 2015-06-26 (×6): 237 mL via ORAL

## 2015-06-24 MED ORDER — PNEUMOCOCCAL VAC POLYVALENT 25 MCG/0.5ML IJ INJ
0.5000 mL | INJECTION | INTRAMUSCULAR | Status: AC
  Administered 2015-06-25: 0.5 mL via INTRAMUSCULAR

## 2015-06-24 MED ORDER — INFLUENZA VAC SPLIT QUAD 0.5 ML IM SUSY
0.5000 mL | PREFILLED_SYRINGE | INTRAMUSCULAR | Status: AC
  Administered 2015-06-25: 0.5 mL via INTRAMUSCULAR

## 2015-06-24 NOTE — Consult Note (Signed)
WOC wound consult note Reason for Consult: Consult requested for sacrum.  Pt states he "had a sore place there for many months."  He is emaciated and incontinent at times; so it is difficult to promote healing.  He currently has a catheter to contain urine. Wound type: Stage 2 to sacrum/inner gluteal fold Pressure Ulcer POA: Yes Measurement: 1X.2X.1cm Wound bed: red moist wound bed Drainage (amount, consistency, odor) No odor or drainage Periwound: Intact skin surrounding Dressing procedure/placement/frequency: Foam dressing to protect and promote healing. Please re-consult if further assistance is needed.  Thank-you,  Cammie Mcgeeawn Damean Poffenberger MSN, RN, CWOCN, KenneyWCN-AP, CNS [phone]

## 2015-06-24 NOTE — Care Management Note (Signed)
Case Management Note Donn PieriniKristi Cecia Egge RN, BSN Unit 2W-Case Manager [phone]  Patient Details  Name: Caleb MarinerJerald J Hays MRN: [id number] Date of Birth: 1941-01-06  Subjective/Objective:    Pt admitted after fall at home- pyelonephritis                Action/Plan: PTA pt lived at home with nephew/niece as recently needed assistance with ADLs - PT/OT evals pending- NCM to follow for recommendations   Expected Discharge Date:                  Expected Discharge Plan:  Home w Home Health Services  In-House Referral:     Discharge planning Services  CM Consult  Post Acute Care Choice:    Choice offered to:     DME Arranged:    DME Agency:     HH Arranged:    HH Agency:     Status of Service:  In process, will continue to follow  Medicare Important Message Given:    Date Medicare IM Given:    Medicare IM give by:    Date Additional Medicare IM Given:    Additional Medicare Important Message give by:     If discussed at Long Length of Stay Meetings, dates discussed:    Additional Comments:  Darrold SpanWebster, Qadir Folks Hall, RN 06/24/2015, 9:39 AM

## 2015-06-24 NOTE — Progress Notes (Addendum)
Physical Therapy Evaluation Patient Details Name: Caleb Hays MRN: [id number] DOB: [birth year] Today's Date: 06/24/2015   History of Present Illness  pt is a 74 y/o male with h/o copd, gout and critical lower limb ischemia, presenting to the ED after being found down at bedside and unable to get up with family assist.  Work up for pyelonephritis and AKI.  Clinical Impression  Pt admitted with/for fall OOB and unable to get up; work up for pyelonephritis, AKI.  Pt currently limited functionally due to the problems listed below.  (see problems list.)  Pt will benefit from PT to maximize function and safety to be able to get home safely with available assist of family.     Follow Up Recommendations  HHPT and intermittent supervision    Equipment Recommendations  None recommended by PT    Recommendations for Other Services       Precautions / Restrictions Precautions Precautions: Fall      Mobility  Bed Mobility Overal bed mobility: Needs Assistance Bed Mobility: Supine to Sit;Sit to Supine     Supine to sit: Min guard Sit to supine: Min guard      Transfers Overall transfer level: Needs assistance   Transfers: Sit to/from Stand Sit to Stand: Min guard            Ambulation/Gait Ambulation/Gait assistance: Min guard Ambulation Distance (Feet): 45 Feet Assistive device: None Gait Pattern/deviations: Step-through pattern Gait velocity: slow Gait velocity interpretation: Below normal speed for age/gender General Gait Details: mildly staggery, steppage gait with little to no heel toe.  Stairs            Wheelchair Mobility    Modified Rankin (Stroke Patients Only)       Balance Overall balance assessment: Needs assistance   Sitting balance-Leahy Scale: Fair     Standing balance support: No upper extremity supported Standing balance-Leahy Scale: Fair                               Pertinent Vitals/Pain Pain Assessment:  Faces Faces Pain Scale: No hurt    Home Living Family/patient expects to be discharged to:: Private residence Living Arrangements:  (niece and her husband) Available Help at Discharge: Family;Available 24 hours/day (niece at home) Type of Home: House Home Access: Stairs to enter Entrance Stairs-Rails: Doctor, general practiceight;Left Entrance Stairs-Number of Steps: 6 Home Layout: One level Home Equipment: Environmental consultantWalker - 2 wheels      Prior Function Level of Independence: Independent         Comments: ambulation is very limited due to LE claudication     Hand Dominance        Extremity/Trunk Assessment               Lower Extremity Assessment: Generalized weakness         Communication   Communication: No difficulties  Cognition Arousal/Alertness: Awake/alert Behavior During Therapy: WFL for tasks assessed/performed Overall Cognitive Status: Within Functional Limits for tasks assessed                      General Comments      Exercises        Assessment/Plan    PT Assessment Patient needs continued PT services  PT Diagnosis Difficulty walking;Generalized weakness   PT Problem List Decreased strength;Decreased activity tolerance;Decreased balance;Decreased mobility;Decreased knowledge of use of DME;Decreased knowledge of precautions  PT Treatment Interventions Gait training;Stair training;Functional mobility  training;Therapeutic activities;Balance training;Patient/family education;DME instruction   PT Goals (Current goals can be found in the Care Plan section) Acute Rehab PT Goals Patient Stated Goal: get home PT Goal Formulation: With patient Time For Goal Achievement: 07/01/15 Potential to Achieve Goals: Good    Frequency Min 3X/week   Barriers to discharge        Co-evaluation               End of Session   Activity Tolerance: Patient tolerated treatment well Patient left: in bed;with call bell/phone within reach Nurse Communication: Mobility  status         Time: 1610-9604 PT Time Calculation (min) (ACUTE ONLY): 28 min   Charges:   PT Evaluation $Initial PT Evaluation Tier I: 1 Procedure PT Treatments $Gait Training: 8-22 mins   PT G Codes:        Melvinia Ashby, Eliseo Gum 06/24/2015, 3:22 PM 06/24/2015  [place] Bing, PT [phone]  (pager)

## 2015-06-24 NOTE — Evaluation (Addendum)
Clinical/Bedside Swallow Evaluation Patient Details  Name: Caleb Hays MRN: [id number] Date of Birth: 09/19/1940  Today's Date: 06/24/2015 Time: SLP Start Time (ACUTE ONLY): 0804 SLP Stop Time (ACUTE ONLY): 0818 SLP Time Calculation (min) (ACUTE ONLY): 14 min  Past Medical History:  Past Medical History  Diagnosis Date  . Gout   . COPD (chronic obstructive pulmonary disease) (HCC)     LONG TIME SMOKER  . 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 Lajoyce Corners   . Critical lower limb ischemia   . Pyelonephritis 06/23/2015  . Arthritis     "hands" (06/23/2015)  . Depression     "periods of depression" (06/23/2015)  . Adult failure to thrive     /notes 06/23/2015   Past Surgical History:  Past Surgical History  Procedure Laterality Date  . Knee cartilage surgery Left 1960's    football injury  . Amputation Right 03/01/2014    Procedure: AMPUTATION RAY;  Surgeon: Nadara Mustard, MD;  Location: [facility] OR;  Service: Orthopedics;  Laterality: Right;  Right Foot 5th Ray Amputation  . Amputation Right 04/26/2014    Procedure: Right Foot 4th Ray Amputation;  Surgeon: Nadara Mustard, MD;  Location: [facility] OR;  Service: Orthopedics;  Laterality: Right;  . Lower extremity angiogram N/A 05/27/2014    Procedure: LOWER EXTREMITY ANGIOGRAM;  Surgeon: Runell Gess, MD;  Location: [facility] CATH LAB;  Service: Cardiovascular;  Laterality: N/A;  . Inguinal hernia repair Left   . Knee ligament reconstruction Left 1960's  . Cataract extraction w/ intraocular lens  implant, bilateral Bilateral    HPI:  74 y.o. male, with COPD, gout, and a history of critical lower limb ischemia, tobacco abuse who presents to the ER after being found down and unable to get up by his family. Per MD notes, pt's family reports pt primarily lays in bed getting up for food for the past 11-12 years. Per MD notes, decreased intake  his speech has become slurred, and at times has been very confused. CT negative, positive for  UTI. Chest x-ray shows no acute infection.   Assessment / Plan / Recommendation Clinical Impression  Pt denies recent coughing with liquids. Immediate cough x 2 with thin exacerbated with large sip and head extension (for last sip in cup) and food in oral cavity simultaneously. Despite missing dentition, pt masticated regular texture adequately. CXR clear (however pt dehydrated on admission). Behavioral modifications recommended and educated with pt for upright position, clear oral cavity prior to liquids with explanation of rationale, small bites and sips. Continue regular, thin liquids, meds whole in applesauce and ST will follow.       Aspiration Risk   (mild-mod)    Diet Recommendation   Regular/thin  Medication Administration: Whole meds with puree    Other  Recommendations Oral Care Recommendations: Oral care BID   Follow up Recommendations   (tbd)    Frequency and Duration min 2x/week  2 weeks       Prognosis Prognosis for Safe Diet Advancement:  (fair-good)      Swallow Study   General HPI: 74 y.o. male, with COPD, gout, and a history of critical lower limb ischemia, tobacco abuse who presents to the ER after being found down and unable to get up by his family. Per MD notes, pt's family reports pt primarily lays in bed getting up for food for the past 11-12 years. Per MD notes, decreased intake  his speech has become  slurred, and at times has been very confused. CT negative, positive for UTI. Chest x-ray shows no acute infection. Type of Study: Bedside Swallow Evaluation Previous Swallow Assessment:  (none found) Diet Prior to this Study: Regular;Thin liquids Temperature Spikes Noted: No Respiratory Status: Nasal cannula History of Recent Intubation: No Behavior/Cognition: Alert;Cooperative;Pleasant mood Oral Cavity Assessment: Within Functional Limits Oral Care Completed by SLP: No Oral Cavity - Dentition: Missing dentition;Edentulous (basically endentulous, has 1  tooth) Vision: Functional for self-feeding Self-Feeding Abilities: Able to feed self Patient Positioning: Upright in bed Baseline Vocal Quality: Normal Volitional Cough: Strong Volitional Swallow: Able to elicit    Oral/Motor/Sensory Function Overall Oral Motor/Sensory Function: Within functional limits   Ice Chips Ice chips: Not tested   Thin Liquid Thin Liquid: Impaired Presentation: Cup;Straw Pharyngeal  Phase Impairments: Cough - Immediate    Nectar Thick Nectar Thick Liquid: Not tested   Honey Thick Honey Thick Liquid: Not tested   Puree Puree: Within functional limits   Solid Solid: Within functional limits       Royce MacadamiaLitaker, Mabry Santarelli Willis 06/24/2015,8:31 AM  Breck CoonsLisa Willis Lonell FaceLitaker M.Ed [employer]-SLP Pager [phone]

## 2015-06-24 NOTE — Progress Notes (Addendum)
Initial Nutrition Assessment  DOCUMENTATION CODES:   Non-severe (moderate) malnutrition in context of chronic illness  INTERVENTION:   Ensure Enlive po TID, each supplement provides 350 kcal and 20 grams of protein  NUTRITION DIAGNOSIS:   Increased nutrient needs related to chronic illness as evidenced by estimated needs  GOAL:   Patient will meet greater than or equal to 90% of their needs  MONITOR:   PO intake, Supplement acceptance, Labs, Weight trends, Skin, I & O's  REASON FOR ASSESSMENT:   Consult Assessment of nutrition requirement/status  ASSESSMENT:   74 y.o. Male, with COPD, gout, and a history of critical lower limb ischemia, tobacco abuse who presents to the ER after being found down and unable to get up by his family. Per MD notes, pt's family reports pt primarily lays in bed getting up for food for the past 11-12 years. Per MD notes, decreased intake his speech has become slurred, and at times has been very confused. CT negative, positive for UTI. Chest x-ray shows no acute infection.   Patient reports his appetite hasn't been very good the last few days.  He typically consumes 2-3 meals per day.  Nutrient needs increased given COPD, wounds.  Weight stable.  Amenable to Ensure Enlive oral nutrition supplements.  RD to order.  Nutrition-Focused physical exam completed. Findings are moderate fat depletion, moderate muscle depletion, and no edema.   Diet Order:  Diet Heart Room service appropriate?: Yes; Fluid consistency:: Thin  Skin:  Wound (see comment) (Stage II to sacrum/inner gluteal fold)  Last BM:  12/6  Height:   Ht Readings from Last 1 Encounters:  06/23/15 6\' 4"  (1.93 m)    Weight:   Wt Readings from Last 1 Encounters:  06/24/15 178 lb 9.2 oz (81 kg)    Wt Readings from Last 20 Encounters:  06/24/15 178 lb 9.2 oz (81 kg)  07/23/14 179 lb (81.194 kg)  06/11/14 178 lb (80.74 kg)  05/31/14 178 lb 8 oz (80.967 kg)  05/30/14 177 lb (80.287  kg)  05/27/14 175 lb (79.379 kg)  05/13/14 175 lb (79.379 kg)  04/26/14 175 lb (79.379 kg)  03/12/14 177 lb 4.8 oz (80.423 kg)  03/01/14 184 lb (83.462 kg)    Ideal Body Weight:  91.8 kg  BMI:  Body mass index is 21.75 kg/(m^2).  Estimated Nutritional Needs:   Kcal:  2100-2300  Protein:  105-115 gm  Fluid:  2.1-2.3 L  EDUCATION NEEDS:   No education needs identified at this time  Maureen ChattersKatie Osceola Carmack, RD, LDN Pager #: [phone] After-Hours Pager #: [phone]

## 2015-06-24 NOTE — Progress Notes (Signed)
Utilization review completed. Skyra Crichlow, RN, BSN. 

## 2015-06-24 NOTE — Progress Notes (Addendum)
[facility] PROGRESS NOTE  Caleb Hays NWG:[id number] DOB: [birth year] DOA: 06/23/2015 PCP: Egbert Garibaldi, NP  Assessment/Plan:  Pyelonephritis- patient started on Rocephin, UA showed too numerous to count WBC. Patient does complain of dysuria. Follow the urine culture results.  Acute kidney injury- patient's baseline creatinine is around 1, he presented with creatinine of 3.21 with BUN 52. Started on IV normal saline at 100 mL per hour. Today creatinine is improved to 2.78, BUN is improved 49. Continue IV fluids.  Metabolic acidosis- likely from dehydration and acute kidney injury. Continue IV fluids. Will follow BMP in a.m.  Mild rhabdomyolysis- patient was found on the floor unable to get up. Mild elevation of CK. After IV fluid CK is down to 545. Will repeat CK level in a.m.  COPD exacerbation-improved, patient started on prednisone, when necessary nebulizer, Mucinex.  Tobacco abuse- counseled, continue nicotine patch  Protein calorie malnutrition- history of weight loss, abdomen 2.8. Nutrition consulted. Patient started on Ensure 3 times a day.  DVT prophylaxis- Lovenox    Code Status: Full code Family Communication: No family present at bedside Disposition Plan: Pending improvement in rhabdomyolysis   Consultants:  None  Procedures:  None  Antibiotics:  Ceftriaxone  HPI/Subjective: a 74 y.o. male, with COPD, gout, and a history of critical lower limb ischemia, who presents to the ER after being found down and unable to get up by his family.His nephew noticed on Saturday that he was feeling poorly and gave him Day Quil. He also noticed that his legs were cold and he was eating much less. On Sunday he fell after he lost his balance and his family wanted to bring him to the ER but he refused. During the night the patient must of gotten up and fallen because he was found on the floor this morning by his niece at approximately 11 AM. The patient has no  recollection of getting up or falling. He was unable to stand on his own. He was found in a large amount of feces. His nephew notes he has recently become incontinent of urine, his speech has become slurred, and at times has been very confused.  the emergency department his white count is 13.9, lactic acid is 2.03, creatinine is 3.21 increased from a baseline of 1.0. CK is 784. Urine appears positive for infection. CT head shows no acute abnormality. Chest x-ray shows no acute infection. X-rays of the lumbar spine show arthritic change most marked at L5-S1 but no fracture.  This morning patient feels better. Total CK is down to 545, creatinine 2.78 BUN 49  Objective: Filed Vitals:   06/24/15 0309 06/24/15 1306  BP: 128/62 99/73  Pulse: 111 83  Temp: 98.7 F (37.1 C) 98.9 F (37.2 C)  Resp: 20 19    Intake/Output Summary (Last 24 hours) at 06/24/15 1318 Last data filed at 06/24/15 1250  Gross per 24 hour  Intake    940 ml  Output   1077 ml  Net   -137 ml   Filed Weights   06/23/15 1236 06/24/15 0309 06/24/15 1201  Weight: 95.255 kg (210 lb) 95.6 kg (210 lb 12.2 oz) 81 kg (178 lb 9.2 oz)    Exam:   General:  Appears in no acute distress  Cardiovascular: S1-S2 regular  Respiratory: Clear to auscultation bilaterally  Abdomen: Soft, nontender, no organomegaly  Musculoskeletal: No cyanosis/clubbing/edema of the lower extremities   Data Reviewed: Basic Metabolic Panel:  Recent Labs Lab 06/23/15 1354 06/24/15 0337  NA  135 136  K 5.0 4.7  CL 103 109  CO2 20* 19*  GLUCOSE 114* 86  BUN 52* 49*  CREATININE 3.21* 2.78*  CALCIUM 8.5* 7.8*   Liver Function Tests:  Recent Labs Lab 06/23/15 1354  AST 26  ALT 13*  ALKPHOS 53  BILITOT 0.9  PROT 6.1*  ALBUMIN 2.8*   No results for input(s): LIPASE, AMYLASE in the last 168 hours. No results for input(s): AMMONIA in the last 168 hours. CBC:  Recent Labs Lab 06/23/15 1354 06/24/15 0337  WBC 13.9* 6.9   NEUTROABS 10.1*  --   HGB 9.7* 8.4*  HCT 30.3* 26.5*  MCV 70.8* 71.0*  PLT 194 163   Cardiac Enzymes:  Recent Labs Lab 06/23/15 1354 06/24/15 0337  CKTOTAL 784* 545*   BNP (last 3 results) No results for input(s): BNP in the last 8760 hours.  ProBNP (last 3 results) No results for input(s): PROBNP in the last 8760 hours.  CBG: No results for input(s): GLUCAP in the last 168 hours.  Recent Results (from the past 240 hour(s))  Urine culture     Status: None (Preliminary result)   Collection Time: 06/23/15  1:32 PM  Result Value Ref Range Status   Specimen Description URINE, CATHETERIZED  Final   Special Requests NONE  Final   Culture TOO YOUNG TO READ  Final   Report Status PENDING  Incomplete  Blood culture (routine x 2)     Status: None (Preliminary result)   Collection Time: 06/23/15  1:54 PM  Result Value Ref Range Status   Specimen Description BLOOD RIGHT ANTECUBITAL  Final   Special Requests BOTTLES DRAWN AEROBIC AND ANAEROBIC 5CC  Final   Culture NO GROWTH < 24 HOURS  Final   Report Status PENDING  Incomplete  Blood culture (routine x 2)     Status: None (Preliminary result)   Collection Time: 06/23/15  3:51 PM  Result Value Ref Range Status   Specimen Description BLOOD BLOOD RIGHT FOREARM  Final   Special Requests IN PEDIATRIC BOTTLE 3CC  Final   Culture NO GROWTH < 24 HOURS  Final   Report Status PENDING  Incomplete     Studies: Dg Chest 1 View  06/23/2015  CLINICAL DATA:  Pain following fall EXAM: CHEST 1 VIEW COMPARISON:  March 01, 2014 FINDINGS: There is no edema or consolidation. The heart size and pulmonary vascularity are normal. No adenopathy. There is atherosclerotic calcification in the aorta. No bone lesions. No pneumothorax. IMPRESSION: No edema or consolidation. Electronically Signed   By: Bretta BangWilliam  Woodruff III M.D.   On: 06/23/2015 14:41   Dg Thoracic Spine 2 View  06/23/2015  CLINICAL DATA:  Fall 1 day ago with persistent back pain, initial  encounter EXAM: THORACIC SPINE 2 VIEWS COMPARISON:  03/01/1949 FINDINGS: Vertebral body height is well maintained. Multilevel degenerative changes are seen. No acute compression deformity is noted. No paraspinal mass lesion is noted. IMPRESSION: Degenerative change without acute abnormality. Electronically Signed   By: Alcide CleverMark  Lukens M.D.   On: 06/23/2015 14:48   Dg Lumbar Spine 2-3 Views  06/23/2015  CLINICAL DATA:  Pain following fall EXAM: LUMBAR SPINE - 2-3 VIEW COMPARISON:  None. FINDINGS: Frontal, lateral, and spot lumbosacral lateral images were obtained. The there are 5 non-rib-bearing lumbar type vertebral bodies. There is no fracture or spondylolisthesis. There is moderately severe disc space narrowing at L5-S1. There is mild disc space narrowing at L3-4. There are anterior and lateral osteophytes at all levels.  There is extensive atherosclerotic calcification in the aorta and iliac arteries. Calcification is also noted in the splenic artery and peripheral right renal artery. 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 No fracture or spondylolisthesis. Extensive atherosclerotic calcification at multiple sites. Electronically Signed   By: Bretta Bang III M.D.   On: 06/23/2015 14:47   Ct Head Wo Contrast  06/23/2015  CLINICAL DATA:  Altered mental status.  Fall. EXAM: CT HEAD WITHOUT CONTRAST TECHNIQUE: Contiguous axial images were obtained from the base of the skull through the vertex without intravenous contrast. COMPARISON:  None. FINDINGS: There is atrophy and chronic small vessel disease changes. No acute intracranial abnormality. Specifically, no hemorrhage, hydrocephalus, mass lesion, acute infarction, or significant intracranial injury. No acute calvarial abnormality. IMPRESSION: No acute intracranial abnormality. Electronically Signed   By: Charlett Nose M.D.   On: 06/23/2015 15:40   US Renal  06/23/2015  CLINICAL DATA:  74 year old male with pyelonephritis. EXAM: RENAL /  URINARY TRACT ULTRASOUND COMPLETE COMPARISON:  Abdominal radiograph dated 06/23/2015 FINDINGS: Right Kidney: Length: 13.9 cm. Two nonobstructing stones each measuring approximately 4 mm noted in the interpolar aspect of the right kidney. There is no hydronephrosis. There is a 9 x 8 x 7 mm hypoechoic exophytic structure from the superior cortex of the right kidney, likely a cyst. Left Kidney: Length: 12.7 cm. There is thinning of the renal cortex with increased echogenicity. There is no hydronephrosis or echogenic stone. Bladder: Layering debris noted within the urinary bladder. The spleen is enlarged measuring 14 cm in greatest dimension. IMPRESSION: Small nonobstructing right renal calculi.  No hydronephrosis. Left renal cortical atrophy.  No hydronephrosis or echogenic stone. Echogenic debris within the urinary bladder. Correlation with urinalysis recommended. Splenomegaly. Electronically Signed   By: Elgie Collard M.D.   On: 06/23/2015 21:54    Scheduled Meds: . aspirin EC  81 mg Oral Daily  . cefTRIAXone (ROCEPHIN)  IV  1 g Intravenous Q24H  . feeding supplement (ENSURE ENLIVE)  237 mL Oral TID BM  . guaiFENesin  1,200 mg Oral BID  . heparin  5,000 Units Subcutaneous 3 times per day  . [START ON 06/25/2015] Influenza vac split quadrivalent PF  0.5 mL Intramuscular Tomorrow-1000  . ipratropium-albuterol  3 mL Nebulization Once  . nicotine  14 mg Transdermal Q24H  . [START ON 06/25/2015] pneumococcal 23 valent vaccine  0.5 mL Intramuscular Tomorrow-1000  . predniSONE  50 mg Oral Q breakfast  . sodium chloride  3 mL Intravenous Q12H   Continuous Infusions: . sodium chloride 100 mL/hr at 06/23/15 2258    Principal Problem:   Pyelonephritis Active Problems:   UTI (lower urinary tract infection)   Acute renal failure (ARF) (HCC)   Failure to thrive in adult   Weight loss   Tobacco abuse   Hypoxia   Dehydration, moderate   COPD (chronic obstructive pulmonary disease) (HCC)   Fall    Lactic acidosis   Malnutrition of moderate degree   Pressure ulcer    Time spent: *25 min    Sabrin Dunlevy S  [facility] Pager [phone] If 7PM-7AM, please contact night-coverage at [email or web address], password [facility] 06/24/2015, 1:18 PM  LOS: 1 day

## 2015-06-24 NOTE — Evaluation (Signed)
Occupational Therapy Evaluation Patient Details Name: Caleb MarinerJerald J Hays MRN: [id number] DOB: Oct 16, 1940 Today's Date: 06/24/2015    History of Present Illness pt is a 74 y/o male with h/o copd, gout and critical lower limb ischemia, presenting to the ED after being found down at bedside and unable to get up with family assist.  Work up for pyelonephritis and AKI.   Clinical Impression   Pt admitted with above. He demonstrates the below listed deficits and will benefit from continued OT to maximize safety and independence with BADLs.  Pt presents with generalized weakness, and mild balance impairment.  Currently, he requires min guard - min a with ADLs.  Anticipate he will progress back to mod I.        Follow Up Recommendations  No OT follow up;Supervision - Intermittent    Equipment Recommendations  3 in 1 bedside comode    Recommendations for Other Services       Precautions / Restrictions Precautions Precautions: Fall      Mobility Bed Mobility Overal bed mobility: Needs Assistance Bed Mobility: Supine to Sit;Sit to Supine     Supine to sit: Min guard Sit to supine: Min guard      Transfers Overall transfer level: Needs assistance Equipment used: Rolling walker (2 wheeled) Transfers: Sit to/from UGI CorporationStand;Stand Pivot Transfers Sit to Stand: Min guard Stand pivot transfers: Min guard       General transfer comment: cues for hand placement     Balance Overall balance assessment: Needs assistance Sitting-balance support: Feet supported Sitting balance-Leahy Scale: Good     Standing balance support: During functional activity Standing balance-Leahy Scale: Poor Standing balance comment: requires min A                            ADL Overall ADL's : Needs assistance/impaired Eating/Feeding: Independent;Bed level   Grooming: Wash/dry hands;Wash/dry face;Oral care;Brushing hair;Min guard;Standing   Upper Body Bathing: Set up;Sitting   Lower Body  Bathing: Minimal assistance;Sit to/from stand   Upper Body Dressing : Set up;Sitting   Lower Body Dressing: Minimal assistance;Sit to/from stand   Toilet Transfer: Min guard;Ambulation;Comfort height toilet;BSC;Grab bars;RW   Toileting- Clothing Manipulation and Hygiene: Minimal assistance;Sit to/from stand       Functional mobility during ADLs: Min guard;Minimal assistance;Rolling walker General ADL Comments: requires min A for balance during ADLs      Vision     Perception     Praxis      Pertinent Vitals/Pain Pain Assessment: Faces Faces Pain Scale: Hurts a little bit Pain Location: bill legs with ambulation  Pain Descriptors / Indicators: Aching Pain Intervention(s): Monitored during session     Hand Dominance Right   Extremity/Trunk Assessment Upper Extremity Assessment Upper Extremity Assessment: Generalized weakness   Lower Extremity Assessment Lower Extremity Assessment: Defer to PT evaluation   Cervical / Trunk Assessment Cervical / Trunk Assessment: Kyphotic   Communication Communication Communication: HOH   Cognition Arousal/Alertness: Awake/alert Behavior During Therapy: WFL for tasks assessed/performed Overall Cognitive Status: Within Functional Limits for tasks assessed                     General Comments       Exercises       Shoulder Instructions      Home Living Family/patient expects to be discharged to:: Private residence Living Arrangements:  (niece and her husband) Available Help at Discharge: Family;Available 24 hours/day (niece at home) Type of  Home: House Home Access: Stairs to enter [employer] of Steps: 6 Entrance Stairs-Rails: Right;Left Home Layout: One level     Bathroom Shower/Tub: Producer, television/film/video: Standard     Home Equipment: Environmental consultant - 2 wheels;Shower seat          Prior Functioning/Environment Level of Independence: Independent;Needs assistance    ADL's / Homemaking  Assistance Needed: requires assistance with IADLs   Comments: ambulation is very limited due to LE claudication    OT Diagnosis: Generalized weakness   OT Problem List: Decreased strength;Decreased activity tolerance;Impaired balance (sitting and/or standing)   OT Treatment/Interventions: Self-care/ADL training;DME and/or AE instruction;Therapeutic activities;Patient/family education;Balance training    OT Goals(Current goals can be found in the care plan section) Acute Rehab OT Goals Patient Stated Goal: get home OT Goal Formulation: With patient Time For Goal Achievement: 07/08/15 Potential to Achieve Goals: Good ADL Goals Pt Will Perform Grooming: with modified independence;standing Pt Will Perform Upper Body Bathing: with modified independence;sitting;standing Pt Will Perform Lower Body Bathing: with modified independence;sit to/from stand Pt Will Perform Upper Body Dressing: with modified independence;sitting;standing Pt Will Perform Lower Body Dressing: with modified independence;sit to/from stand Pt Will Transfer to Toilet: with modified independence;ambulating;regular height toilet;bedside commode;grab bars Pt Will Perform Toileting - Clothing Manipulation and hygiene: with modified independence;sit to/from stand Pt Will Perform Tub/Shower Transfer: Tub transfer;with supervision;ambulating;shower seat;rolling walker  OT Frequency: Min 2X/week   Barriers to D/C:            Co-evaluation              End of Session Equipment Utilized During Treatment: Rolling walker Nurse Communication: Mobility status  Activity Tolerance: Patient tolerated treatment well Patient left: in bed;with call bell/phone within reach;with bed alarm set   Time: 1721-1754 OT Time Calculation (min): 33 min Charges:  OT General Charges $OT Visit: 1 Procedure OT Evaluation $Initial OT Evaluation Tier I: 1 Procedure OT Treatments $Self Care/Home Management : 8-22 mins G-Codes:     Gagan Dillion M 07-10-2015, 6:08 PM

## 2015-06-25 DIAGNOSIS — A419 Sepsis, unspecified organism: Secondary | ICD-10-CM | POA: Diagnosis not present

## 2015-06-25 DIAGNOSIS — N12 Tubulo-interstitial nephritis, not specified as acute or chronic: Secondary | ICD-10-CM

## 2015-06-25 LAB — CBC
HEMATOCRIT: 24.9 % — AB (ref 39.0–52.0)
Hemoglobin: 7.7 g/dL — ABNORMAL LOW (ref 13.0–17.0)
MCH: 22.3 pg — AB (ref 26.0–34.0)
MCHC: 30.9 g/dL (ref 30.0–36.0)
MCV: 72.2 fL — ABNORMAL LOW (ref 78.0–100.0)
Platelets: 150 10*3/uL (ref 150–400)
RBC: 3.45 MIL/uL — ABNORMAL LOW (ref 4.22–5.81)
RDW: 17.7 % — AB (ref 11.5–15.5)
WBC: 5.5 10*3/uL (ref 4.0–10.5)

## 2015-06-25 LAB — COMPREHENSIVE METABOLIC PANEL
ALT: 16 U/L — AB (ref 17–63)
AST: 20 U/L (ref 15–41)
Albumin: 2.3 g/dL — ABNORMAL LOW (ref 3.5–5.0)
Alkaline Phosphatase: 42 U/L (ref 38–126)
Anion gap: 7 (ref 5–15)
BILIRUBIN TOTAL: 0.4 mg/dL (ref 0.3–1.2)
BUN: 51 mg/dL — AB (ref 6–20)
CHLORIDE: 109 mmol/L (ref 101–111)
CO2: 20 mmol/L — ABNORMAL LOW (ref 22–32)
CREATININE: 2.12 mg/dL — AB (ref 0.61–1.24)
Calcium: 8 mg/dL — ABNORMAL LOW (ref 8.9–10.3)
GFR calc Af Amer: 34 mL/min — ABNORMAL LOW (ref >60)
GFR, EST NON AFRICAN AMERICAN: 29 mL/min — AB (ref >60)
GLUCOSE: 141 mg/dL — AB (ref 65–99)
Potassium: 5.3 mmol/L — ABNORMAL HIGH (ref 3.5–5.1)
Sodium: 136 mmol/L (ref 135–145)
Total Protein: 5.2 g/dL — ABNORMAL LOW (ref 6.5–8.1)

## 2015-06-25 LAB — URINE CULTURE: Culture: >=100000

## 2015-06-25 LAB — CK: CK TOTAL: 199 U/L (ref 49–397)

## 2015-06-25 MED ORDER — SODIUM CHLORIDE 0.9 % IV SOLN
INTRAVENOUS | Status: DC
  Administered 2015-06-25 – 2015-06-26 (×2): via INTRAVENOUS

## 2015-06-25 MED ORDER — PREDNISONE 20 MG PO TABS
40.0000 mg | ORAL_TABLET | Freq: Every day | ORAL | Status: AC
  Administered 2015-06-26: 40 mg via ORAL
  Filled 2015-06-25: qty 2

## 2015-06-25 MED ORDER — ZOLPIDEM TARTRATE 5 MG PO TABS
5.0000 mg | ORAL_TABLET | Freq: Every evening | ORAL | Status: DC | PRN
  Administered 2015-06-26: 5 mg via ORAL
  Filled 2015-06-25: qty 1

## 2015-06-25 MED ORDER — PREDNISONE 10 MG PO TABS: 10.0000 mg | ORAL_TABLET | Freq: Every day | ORAL | Status: DC

## 2015-06-25 MED ORDER — PREDNISONE 20 MG PO TABS: 20.0000 mg | ORAL_TABLET | Freq: Every day | ORAL | Status: DC

## 2015-06-25 MED ORDER — PREDNISONE 20 MG PO TABS: 30.0000 mg | ORAL_TABLET | Freq: Every day | ORAL | Status: DC

## 2015-06-25 NOTE — Progress Notes (Signed)
Speech Language Pathology Treatment: Dysphagia  Patient Details Name: Caleb Hays MRN: [id number] DOB: 09/20/1940 Today's Date: 06/25/2015 Time: 0810-0821 SLP Time Calculation (min) (ACUTE ONLY): 11 min  Assessment / Plan / Recommendation Clinical Impression  Pt's swallow function safer with increased efficiency this morning. He independently recalled swallow strategies and needed min reminders to implement during breakfast meal. Mildly dyspneic; larger sips with extended apneic period, however appropriate exhalation post swallow. No cough, throat clear or wet vocal quality. Reiterated safe precautions. Continue regular texture and thin liquids, discharge from ST services.     HPI HPI: 74 y.o. male, with COPD, gout, and a history of critical lower limb ischemia, tobacco abuse who presents to the ER after being found down and unable to get up by his family. Per MD notes, pt's family reports pt primarily lays in bed getting up for food for the past 11-12 years. Per MD notes, decreased intake  his speech has become slurred, and at times has been very confused. CT negative, positive for UTI. Chest x-ray shows no acute infection.      SLP Plan  All goals met;Discharge SLP treatment due to (comment)     Recommendations  Diet recommendations: Regular;Thin liquid Liquids provided via: Cup;Straw Medication Administration: Whole meds with liquid Supervision: Patient able to self feed Compensations: Slow rate;Small sips/bites Postural Changes and/or Swallow Maneuvers: Seated upright 90 degrees              Oral Care Recommendations: Oral care BID Follow up Recommendations: None Plan: All goals met;Discharge SLP treatment due to (comment)   Houston Siren 06/25/2015, 8:24 AM  Orbie Pyo Colvin Caroli.Ed [employer] [phone]

## 2015-06-25 NOTE — Care Management Note (Signed)
Case Management Note Donn PieriniKristi Webster RN, BSN Unit 2W-Case Manager [phone]  Patient Details  Name: Caleb MarinerJerald J Hays MRN: [id number] Date of Birth: 1941/01/31  Subjective/Objective:    Pt admitted after fall at home- pyelonephritis                Action/Plan: PTA pt lived at home with nephew/niece as recently needed assistance with ADLs - PT/OT evals pending- NCM to follow for recommendations   Expected Discharge Date:                  Expected Discharge Plan:  Home w Home Health Services  In-House Referral:     Discharge planning Services  CM Consult  Post Acute Care Choice:    Choice offered to:     DME [facility]:   [facility]  HH Arranged:   PT, SW HH Agency:   [facility]  Status of Service:  In process, will continue to follow  Medicare Important Message Given:    Date Medicare IM Given:    Medicare IM give by:    Date Additional Medicare IM Given:    Additional Medicare Important Message give by:     If discussed at Long Length of Stay Meetings, dates discussed:    Additional Comments: [facility], [facility], agency contacted and referral accepted   CM assessed pt; CM contacted niece Caleb Hays via phone.  It was communicated to CM that niece and nephew were concerned with pt returning home with them due to recent fall.  Caleb Hays communicated to CM that she was concerned that if pt returned home and fell, she might not be able to get him up.  Caleb Hays is available 24 hours a day with pt however husband works.  CM communicated that pt had been evaluated by PT/OT and [facility]  CM communicated with  Caleb Hays that HHPT and SW could be ordered for home, explaining that the HHSW could assist with placement if pt declines and it is required, Caleb Hays stated she felt this is acceptable.  CM spoke with [facility]  PT regarding family concern at home and overall safeness of home discharge; in house PT stated pt is safe to discharge home  with recommendations.  CM also spoke with CSW who recommended HHSW for future placement if necessary.  CM will communicate this to MD and request HH/DME orders. Caleb ParrClaxton, Caleb Miles S, RN 06/25/2015, 12:41 PM

## 2015-06-25 NOTE — Progress Notes (Signed)
[facility] PROGRESS NOTE  Caleb Hays ZHY:[id number] DOB: 1940-08-28 DOA: 06/23/2015 PCP: Egbert Garibaldi, NP  Assessment/Plan:  Pyelonephritis- patient started on Rocephin, UA showed too numerous to count WBC. Patient does complain of dysuria. Urine culture shows Enterobacter, sensitive to Cipro, continue Rocephin until discharge and then switch to Cipro  Acute kidney injury- patient's baseline creatinine is around 1, he presented with creatinine of 3.21 with BUN 52. Started on IV normal saline at 100 mL per hour. Today creatinine is improved to 2.12, BUN is improved 49. Continue IV fluids.  Metabolic acidosis- likely from dehydration and acute kidney injury. Continue IV fluids. Will follow BMP in a.m.  Mild rhabdomyolysis- patient was found on the floor unable to get up. Mild elevation of CK. After IV fluid CK is down to 545. Will repeat CK level in a.m.  COPD exacerbation-improved, patient started on prednisone, when necessary nebulizer, Mucinex.  Tobacco abuse- counseled, continue nicotine patch  Protein calorie malnutrition- history of weight loss, abdomen 2.8. Nutrition consulted. Patient started on Ensure 3 times a day.  DVT prophylaxis- Lovenox    Code Status: Full code Family Communication: No family present at bedside Disposition Plan: Anticipate discharge when renal function improves   Consultants:  None  Procedures:  None  Antibiotics:  Ceftriaxone  HPI/Subjective: a 74 y.o. male, with COPD, gout, and a history of critical lower limb ischemia, who presents to the ER after being found down and unable to get up by his family.His nephew noticed on Saturday that he was feeling poorly and gave him Day Quil. He also noticed that his legs were cold and he was eating much less. On Sunday he fell after he lost his balance and his family wanted to bring him to the ER but he refused. During the night the patient must of gotten up and fallen because he was  found on the floor this morning by his niece at approximately 11 AM. The patient has no recollection of getting up or falling. He was unable to stand on his own. He was found in a large amount of feces. His nephew notes he has recently become incontinent of urine, his speech has become slurred, and at times has been very confused.  the emergency department his white count is 13.9, lactic acid is 2.03, creatinine is 3.21 increased from a baseline of 1.0. CK is 784. Urine appears positive for infection. CT head shows no acute abnormality. Chest x-ray shows no acute infection. X-rays of the lumbar spine show arthritic change most marked at L5-S1 but no fracture.   Subjective  Feels better and stronger, no pain  Objective: Filed Vitals:   06/24/15 1954 06/25/15 0506  BP: 122/73 97/53  Pulse: 74 71  Temp: 98.2 F (36.8 C) 97.5 F (36.4 C)  Resp: 18 18    Intake/Output Summary (Last 24 hours) at 06/25/15 1210 Last data filed at 06/25/15 2952  Gross per 24 hour  Intake    600 ml  Output   1500 ml  Net   -900 ml   Filed Weights   06/24/15 0309 06/24/15 1201 06/25/15 0506  Weight: 95.6 kg (210 lb 12.2 oz) 81 kg (178 lb 9.2 oz) 81.6 kg (179 lb 14.3 oz)    Exam:   General:  Appears in no acute distress  Cardiovascular: S1-S2 regular  Respiratory: Clear to auscultation bilaterally  Abdomen: Soft, nontender, no organomegaly  Musculoskeletal: No cyanosis/clubbing/edema of the lower extremities   Data Reviewed: Basic Metabolic Panel:  Recent Labs Lab 06/23/15 1354 06/24/15 0337 06/25/15 0316  NA 135 136 136  K 5.0 4.7 5.3*  CL 103 109 109  CO2 20* 19* 20*  GLUCOSE 114* 86 141*  BUN 52* 49* 51*  CREATININE 3.21* 2.78* 2.12*  CALCIUM 8.5* 7.8* 8.0*   Liver Function Tests:  Recent Labs Lab 06/23/15 1354 06/25/15 0316  AST 26 20  ALT 13* 16*  ALKPHOS 53 42  BILITOT 0.9 0.4  PROT 6.1* 5.2*  ALBUMIN 2.8* 2.3*   No results for input(s): LIPASE, AMYLASE in the  last 168 hours. No results for input(s): AMMONIA in the last 168 hours. CBC:  Recent Labs Lab 06/23/15 1354 06/24/15 0337 06/25/15 0316  WBC 13.9* 6.9 5.5  NEUTROABS 10.1*  --   --   HGB 9.7* 8.4* 7.7*  HCT 30.3* 26.5* 24.9*  MCV 70.8* 71.0* 72.2*  PLT 194 163 150   Cardiac Enzymes:  Recent Labs Lab 06/23/15 1354 06/24/15 0337 06/25/15 0316  CKTOTAL 784* 545* 199   BNP (last 3 results) No results for input(s): BNP in the last 8760 hours.  ProBNP (last 3 results) No results for input(s): PROBNP in the last 8760 hours.  CBG: No results for input(s): GLUCAP in the last 168 hours.  Recent Results (from the past 240 hour(s))  Urine culture     Status: None   Collection Time: 06/23/15  1:32 PM  Result Value Ref Range Status   Specimen Description URINE, CATHETERIZED  Final   Special Requests NONE  Final   Culture >=100,000 COLONIES/mL ENTEROBACTER CLOACAE  Final   Report Status 06/25/2015 FINAL  Final   Organism ID, Bacteria ENTEROBACTER CLOACAE  Final      Susceptibility   Enterobacter cloacae - MIC*    CEFTRIAXONE <=1 SENSITIVE Sensitive     CIPROFLOXACIN <=0.25 SENSITIVE Sensitive     GENTAMICIN <=1 SENSITIVE Sensitive     IMIPENEM <=0.25 SENSITIVE Sensitive     NITROFURANTOIN 32 SENSITIVE Sensitive     TRIMETH/SULFA <=20 SENSITIVE Sensitive     PIP/TAZO <=4 SENSITIVE Sensitive     * >=100,000 COLONIES/mL ENTEROBACTER CLOACAE  Blood culture (routine x 2)     Status: None (Preliminary result)   Collection Time: 06/23/15  1:54 PM  Result Value Ref Range Status   Specimen Description BLOOD RIGHT ANTECUBITAL  Final   Special Requests BOTTLES DRAWN AEROBIC AND ANAEROBIC 5CC  Final   Culture NO GROWTH < 24 HOURS  Final   Report Status PENDING  Incomplete  Blood culture (routine x 2)     Status: None (Preliminary result)   Collection Time: 06/23/15  3:51 PM  Result Value Ref Range Status   Specimen Description BLOOD BLOOD RIGHT FOREARM  Final   Special Requests  IN PEDIATRIC BOTTLE 3CC  Final   Culture NO GROWTH < 24 HOURS  Final   Report Status PENDING  Incomplete     Studies: Dg Chest 1 View  06/23/2015  CLINICAL DATA:  Pain following fall EXAM: CHEST 1 VIEW COMPARISON:  March 01, 2014 FINDINGS: There is no edema or consolidation. The heart size and pulmonary vascularity are normal. No adenopathy. There is atherosclerotic calcification in the aorta. No bone lesions. No pneumothorax. IMPRESSION: No edema or consolidation. Electronically Signed   By: Bretta Bang III M.D.   On: 06/23/2015 14:41   Dg Thoracic Spine 2 View  06/23/2015  CLINICAL DATA:  Fall 1 day ago with persistent back pain, initial encounter EXAM: THORACIC SPINE 2  VIEWS COMPARISON:  03/01/1949 FINDINGS: Vertebral body height is well maintained. Multilevel degenerative changes are seen. No acute compression deformity is noted. No paraspinal mass lesion is noted. IMPRESSION: Degenerative change without acute abnormality. Electronically Signed   By: Alcide Clever M.D.   On: 06/23/2015 14:48   Dg Lumbar Spine 2-3 Views  06/23/2015  CLINICAL DATA:  Pain following fall EXAM: LUMBAR SPINE - 2-3 VIEW COMPARISON:  None. FINDINGS: Frontal, lateral, and spot lumbosacral lateral images were obtained. The there are 5 non-rib-bearing lumbar type vertebral bodies. There is no fracture or spondylolisthesis. There is moderately severe disc space narrowing at L5-S1. There is mild disc space narrowing at L3-4. There are anterior and lateral osteophytes at all levels. There is extensive atherosclerotic calcification in the aorta and iliac arteries. Calcification is also noted in the splenic artery and peripheral right renal artery. 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 No fracture or spondylolisthesis. Extensive atherosclerotic calcification at multiple sites. Electronically Signed   By: Bretta Bang III M.D.   On: 06/23/2015 14:47   Ct Head Wo Contrast  06/23/2015  CLINICAL DATA:   Altered mental status.  Fall. EXAM: CT HEAD WITHOUT CONTRAST TECHNIQUE: Contiguous axial images were obtained from the base of the skull through the vertex without intravenous contrast. COMPARISON:  None. FINDINGS: There is atrophy and chronic small vessel disease changes. No acute intracranial abnormality. Specifically, no hemorrhage, hydrocephalus, mass lesion, acute infarction, or significant intracranial injury. No acute calvarial abnormality. IMPRESSION: No acute intracranial abnormality. Electronically Signed   By: Charlett Nose M.D.   On: 06/23/2015 15:40   US Renal  06/23/2015  CLINICAL DATA:  74 year old male with pyelonephritis. EXAM: RENAL / URINARY TRACT ULTRASOUND COMPLETE COMPARISON:  Abdominal radiograph dated 06/23/2015 FINDINGS: Right Kidney: Length: 13.9 cm. Two nonobstructing stones each measuring approximately 4 mm noted in the interpolar aspect of the right kidney. There is no hydronephrosis. There is a 9 x 8 x 7 mm hypoechoic exophytic structure from the superior cortex of the right kidney, likely a cyst. Left Kidney: Length: 12.7 cm. There is thinning of the renal cortex with increased echogenicity. There is no hydronephrosis or echogenic stone. Bladder: Layering debris noted within the urinary bladder. The spleen is enlarged measuring 14 cm in greatest dimension. IMPRESSION: Small nonobstructing right renal calculi.  No hydronephrosis. Left renal cortical atrophy.  No hydronephrosis or echogenic stone. Echogenic debris within the urinary bladder. Correlation with urinalysis recommended. Splenomegaly. Electronically Signed   By: Elgie Collard M.D.   On: 06/23/2015 21:54    Scheduled Meds: . aspirin EC  81 mg Oral Daily  . cefTRIAXone (ROCEPHIN)  IV  1 g Intravenous Q24H  . feeding supplement (ENSURE ENLIVE)  237 mL Oral TID BM  . guaiFENesin  1,200 mg Oral BID  . heparin  5,000 Units Subcutaneous 3 times per day  . Influenza vac split quadrivalent PF  0.5 mL Intramuscular  Tomorrow-1000  . ipratropium-albuterol  3 mL Nebulization Once  . nicotine  14 mg Transdermal Q24H  . pneumococcal 23 valent vaccine  0.5 mL Intramuscular Tomorrow-1000  . [START ON 06/26/2015] predniSONE  40 mg Oral Q breakfast   Followed by  . [START ON 06/27/2015] predniSONE  30 mg Oral Q breakfast   Followed by  . [START ON 06/28/2015] predniSONE  20 mg Oral Q breakfast   Followed by  . [START ON 06/29/2015] predniSONE  10 mg Oral Q breakfast  . sodium chloride  3 mL Intravenous Q12H  Continuous Infusions: . sodium chloride 75 mL/hr at 06/25/15 0802    Principal Problem:   Pyelonephritis Active Problems:   UTI (lower urinary tract infection)   Acute renal failure (ARF) (HCC)   Failure to thrive in adult   Weight loss   Tobacco abuse   Hypoxia   Dehydration, moderate   COPD (chronic obstructive pulmonary disease) (HCC)   Fall   Lactic acidosis   Malnutrition of moderate degree   Pressure ulcer    Time spent: *25 min    [facility],Tyrea Froberg  [facility] Pager [phone] If 7PM-7AM, please contact night-coverage at [email or web address], password [email or web address] 06/25/2015, 12:10 PM  LOS: 2 days

## 2015-06-26 LAB — CBC
HCT: 25.4 % — ABNORMAL LOW (ref 39.0–52.0)
HEMOGLOBIN: 7.7 g/dL — AB (ref 13.0–17.0)
MCH: 22.1 pg — ABNORMAL LOW (ref 26.0–34.0)
MCHC: 30.3 g/dL (ref 30.0–36.0)
MCV: 72.8 fL — ABNORMAL LOW (ref 78.0–100.0)
Platelets: 157 10*3/uL (ref 150–400)
RBC: 3.49 MIL/uL — AB (ref 4.22–5.81)
RDW: 17.7 % — ABNORMAL HIGH (ref 11.5–15.5)
WBC: 6.3 10*3/uL (ref 4.0–10.5)

## 2015-06-26 LAB — COMPREHENSIVE METABOLIC PANEL
ALBUMIN: 2.2 g/dL — AB (ref 3.5–5.0)
ALK PHOS: 43 U/L (ref 38–126)
ALT: 20 U/L (ref 17–63)
ANION GAP: 5 (ref 5–15)
AST: 20 U/L (ref 15–41)
BUN: 44 mg/dL — ABNORMAL HIGH (ref 6–20)
CALCIUM: 8 mg/dL — AB (ref 8.9–10.3)
CHLORIDE: 107 mmol/L (ref 101–111)
CO2: 24 mmol/L (ref 22–32)
CREATININE: 1.64 mg/dL — AB (ref 0.61–1.24)
GFR calc Af Amer: 46 mL/min — ABNORMAL LOW (ref >60)
GFR calc non Af Amer: 40 mL/min — ABNORMAL LOW (ref >60)
GLUCOSE: 109 mg/dL — AB (ref 65–99)
Potassium: 6 mmol/L — ABNORMAL HIGH (ref 3.5–5.1)
SODIUM: 136 mmol/L (ref 135–145)
Total Bilirubin: 0.4 mg/dL (ref 0.3–1.2)
Total Protein: 5.2 g/dL — ABNORMAL LOW (ref 6.5–8.1)

## 2015-06-26 LAB — BASIC METABOLIC PANEL
Anion gap: 8 (ref 5–15)
BUN: 41 mg/dL — AB (ref 6–20)
CALCIUM: 8.3 mg/dL — AB (ref 8.9–10.3)
CO2: 22 mmol/L (ref 22–32)
CREATININE: 1.57 mg/dL — AB (ref 0.61–1.24)
Chloride: 107 mmol/L (ref 101–111)
GFR calc Af Amer: 49 mL/min — ABNORMAL LOW (ref >60)
GFR calc non Af Amer: 42 mL/min — ABNORMAL LOW (ref >60)
GLUCOSE: 125 mg/dL — AB (ref 65–99)
Potassium: 5 mmol/L (ref 3.5–5.1)
Sodium: 137 mmol/L (ref 135–145)

## 2015-06-26 MED ORDER — PREDNISONE 10 MG PO TABS: ORAL_TABLET | ORAL | Status: DC

## 2015-06-26 MED ORDER — ASPIRIN 81 MG PO TBEC: 81.0000 mg | DELAYED_RELEASE_TABLET | Freq: Every day | ORAL | Status: DC

## 2015-06-26 MED ORDER — ENSURE ENLIVE PO LIQD: 237.0000 mL | Freq: Three times a day (TID) | ORAL | Status: DC

## 2015-06-26 MED ORDER — NICOTINE 14 MG/24HR TD PT24: 14.0000 mg | MEDICATED_PATCH | TRANSDERMAL | Status: DC

## 2015-06-26 MED ORDER — TRAZODONE HCL 50 MG PO TABS: 25.0000 mg | ORAL_TABLET | Freq: Every evening | ORAL | Status: DC | PRN

## 2015-06-26 MED ORDER — CIPROFLOXACIN HCL 250 MG PO TABS: 250.0000 mg | ORAL_TABLET | Freq: Two times a day (BID) | ORAL | Status: DC

## 2015-06-26 MED ORDER — POLYETHYLENE GLYCOL 3350 17 G PO PACK: 17.0000 g | PACK | Freq: Every day | ORAL | Status: DC | PRN

## 2015-06-26 MED ORDER — GUAIFENESIN ER 600 MG PO TB12: 1200.0000 mg | ORAL_TABLET | Freq: Two times a day (BID) | ORAL | Status: DC

## 2015-06-26 MED ORDER — ZOLPIDEM TARTRATE 5 MG PO TABS: 5.0000 mg | ORAL_TABLET | Freq: Every evening | ORAL | Status: DC | PRN

## 2015-06-26 MED ORDER — SODIUM POLYSTYRENE SULFONATE 15 GM/60ML PO SUSP
45.0000 g | Freq: Once | ORAL | Status: AC
  Administered 2015-06-26: 45 g via ORAL
  Filled 2015-06-26: qty 180

## 2015-06-26 NOTE — Care Management Note (Addendum)
Case Management Note Donn PieriniKristi Webster RN, BSN Unit 2W-Case Manager [phone]  Patient Details  Name: Caleb MarinerJerald J Parsley MRN: [id number] Date of Birth: Oct 28, 1940  Subjective/Objective:    Pt admitted after fall at home- pyelonephritis                Action/Plan: PTA pt lived at home with nephew/niece as recently needed assistance with ADLs - PT/OT evals pending- NCM to follow for recommendations   Expected Discharge Date:                  Expected Discharge Plan:  Home w Home Health Services  In-House Referral:     Discharge planning Services  CM Consult  Post Acute Care Choice:    Choice offered to:   Patient   DME [facility]:   [facility]  HH Arranged:   PT, SW, Nurse Aide,OT HH Agency:   [facility]  Status of Service:  Complete, will sign off  Medicare Important Message Given:    Date Medicare IM Given:    Medicare IM give by:    Date Additional Medicare IM Given:    Additional Medicare Important Message give by:     If discussed at Long Length of Stay Meetings, dates discussed:    Additional Comments: [facility], agency contacted and referral accepted   CM assessed pt; CM contacted niece [name] via phone.  It was communicated to CM that niece and nephew were concerned with pt returning home with them due to recent fall.  [name] communicated to CM that she was concerned that if pt returned home and fell, she might not be able to get him up.  [name] is available 24 hours a day with pt however husband works.  CM communicated that pt had been evaluated by PT/OT and [facility]  CM communicated with  [name] that HHPT and SW could be ordered for home, explaining that the HHSW could assist with placement if pt declines and it is required, [name] stated she felt this is acceptable.  CM spoke with [facility]  PT regarding family concern at home and overall safeness of home discharge; in house PT stated pt is safe to  discharge home with recommendations.  CM also spoke with CSW who recommended HHSW for future placement if necessary.  CM will communicate this to MD and request HH/DME orders. Cherylann ParrClaxton, Nickol Collister S, RN 06/26/2015, 10:08 AM

## 2015-06-26 NOTE — Progress Notes (Signed)
Occupational Therapy Treatment Patient Details Name: Caleb MarinerJerald J Hays MRN: [id number] DOB: 10/08/1940 Today's Date: 06/26/2015    History of present illness pt is a 74 y/o male with h/o copd, gout and critical lower limb ischemia, presenting to the ED after being found down at bedside and unable to get up with family assist.  Work up for pyelonephritis and AKI.   OT comments  Pt. Making gains with skilled OT.  Requests not to use rw this session as he will not be using it at home.  Amb. To/from b.room with min guard a for toileting.  Completed LB  Dressing seated.  Eager for d/c home and states his niece is home and available 24/7.    Follow Up Recommendations  No OT follow up;Supervision - Intermittent    Equipment Recommendations  3 in 1 bedside comode    Recommendations for Other Services      Precautions / Restrictions Precautions Precautions: Fall       Mobility Bed Mobility Overal bed mobility: Modified Independent Bed Mobility: Rolling;Sidelying to Sit Rolling: Modified independent (Device/Increase time) Sidelying to sit: Modified independent (Device/Increase time)       General bed mobility comments: hob  flat, no rails, exited from left side of bed. no physical assist required  Transfers Overall transfer level: Needs assistance Equipment used: None Transfers: Sit to/from UGI CorporationStand;Stand Pivot Transfers Sit to Stand: Min guard Stand pivot transfers: Min guard            Balance                                   ADL Overall ADL's : Needs assistance/impaired Eating/Feeding: Independent;Bed level                   Lower Body Dressing: Supervision/safety;Sitting/lateral leans Lower Body Dressing Details (indicate cue type and reason): able to cross L/R legs over knees and bend forward and reach to don/doff B socks Toilet Transfer: Min guard;Ambulation;Comfort height toilet   Toileting- Clothing Manipulation and Hygiene: Min guard;Sit  to/from stand       Functional mobility during ADLs: Min guard General ADL Comments: pt. states he would not be ambulating with RW at home. attempted amb. without device.  pt. able to amb. to/from b.room with min guard a.  reviewed uses for 3n1, as pt. was concerned about lack of space in his bedroom for 3n1. reviewed it can also be used in the b.room over the commode during the day      Vision                     Perception     Praxis      Cognition   Behavior During Therapy: [facility]WFL for tasks assessed/performed Overall Cognitive Status: Within Functional Limits for tasks assessed                       Extremity/Trunk Assessment               Exercises     Shoulder Instructions       General Comments  pt. Talking about and asking for sleeping pills for most of the session "will i get an rx for those", then saying "oh and what about some pain pills, my back still hurst too".  rn notified    Pertinent Vitals/ Pain       Pain  Assessment: No/denies pain  Home Living                                          Prior Functioning/Environment              Frequency Min 2X/week     Progress Toward Goals  OT Goals(current goals can now be found in the care plan section)  Progress towards OT goals: Progressing toward goals     Plan Discharge plan remains appropriate    Co-evaluation                 End of Session Equipment Utilized During Treatment: Gait belt   Activity Tolerance Patient tolerated treatment well   Patient Left in bed;with call bell/phone within reach;with bed alarm set   Nurse Communication          Time: 1610-9604 OT Time Calculation (min): 11 min  Charges: OT General Charges $OT Visit: 1 Procedure OT Treatments $Self Care/Home Management : 8-22 mins  Robet Leu, COTA/L 06/26/2015, 8:06 AM

## 2015-06-26 NOTE — Care Management Important Message (Signed)
Important Message  Patient Details  Name: Caleb MarinerJerald J Podesta MRN: [id number] Date of Birth: 1941-04-20   Medicare Important Message Given:  Yes    Kyla BalzarineShealy, Avory Mimbs Abena 06/26/2015, 2:01 PM

## 2015-06-26 NOTE — Progress Notes (Signed)
Physical Therapy Treatment Patient Details Name: Caleb Hays MRN: [id number] DOB: 1941-05-23 Today's Date: 06/26/2015    History of Present Illness pt is a 74 y/o male with h/o copd, gout and critical lower limb ischemia, presenting to the ED after being found down at bedside and unable to get up with family assist.  Work up for pyelonephritis and AKI.    PT Comments    Progressing well.  Tolerates Ther ex well and it works well to warm him up for longer ambulation distances.   Follow Up Recommendations  Home health PT;Supervision - Intermittent     Equipment Recommendations  None recommended by PT    Recommendations for Other Services       Precautions / Restrictions Precautions Precautions: Fall Restrictions Weight Bearing Restrictions: No    Mobility  Bed Mobility Overal bed mobility: Modified Independent Bed Mobility: Rolling;Sidelying to Sit Rolling: Modified independent (Device/Increase time) Sidelying to sit: Modified independent (Device/Increase time)       General bed mobility comments: hob  flat, no rails, exited from left side of bed. no physical assist required  Transfers Overall transfer level: Needs assistance Equipment used: None Transfers: Sit to/from Stand Sit to Stand: Supervision Stand pivot transfers: Min guard       General transfer comment: stood with safety in mind  Ambulation/Gait Ambulation/Gait assistance: Supervision Ambulation Distance (Feet): 140 Feet Assistive device: None (carrying the foley bag) Gait Pattern/deviations: Step-through pattern Gait velocity: slow   General Gait Details: mildly staggery, steppage gait with little to no heel toe.   Stairs            Wheelchair Mobility    Modified Rankin (Stroke Patients Only)       Balance     Sitting balance-Leahy Scale: Good       Standing balance-Leahy Scale: Fair                      Cognition Arousal/Alertness: Awake/alert Behavior  During Therapy: WFL for tasks assessed/performed Overall Cognitive Status: Within Functional Limits for tasks assessed                      Exercises General Exercises - Lower Extremity Ankle Circles/Pumps: AROM;Both;20 reps;Supine Heel Slides: AROM;Strengthening;Both;10 reps;Supine (graded resistance) Hip ABduction/ADduction: AROM;Both;15 reps;Supine Straight Leg Raises: AROM;Strengthening;Both;10 reps;Supine Other Exercises Other Exercises: tricep/bicep presses x 10 resisted    General Comments General comments (skin integrity, edema, etc.): Sats maintained mid 90's and EHR in mid 90's      Pertinent Vitals/Pain Pain Assessment: No/denies pain    Home Living                      Prior Function            PT Goals (current goals can now be found in the care plan section) Acute Rehab PT Goals Patient Stated Goal: get home PT Goal Formulation: With patient Time For Goal Achievement: 07/01/15 Potential to Achieve Goals: Good Progress towards PT goals: Progressing toward goals    Frequency  Min 3X/week    PT Plan      Co-evaluation             End of Session   Activity Tolerance: Patient tolerated treatment well Patient left: in bed;with call bell/phone within reach     Time: 4098-1191 PT Time Calculation (min) (ACUTE ONLY): 27 min  Charges:  $Gait Training: 8-22 mins $Therapeutic Exercise: 8-22 mins  G Codes:      Caleb Hays, Caleb Hays 06/26/2015, 10:34 AM

## 2015-06-26 NOTE — Progress Notes (Signed)
Patient discharged to home with niece. Patient and niece stated their understanding of discharge instructions and medications. Home Health has been assigned to him. IV d'cd and intact.

## 2015-06-26 NOTE — Discharge Summary (Signed)
Physician Discharge Summary  Caleb Hays MRN: [id number] DOB/AGE: 03/16/1941 74 y.o.  PCP: Imelda Pillow, NP   Admit date: 06/23/2015 Discharge date: 06/26/2015  Discharge Diagnoses:     Principal Problem:   Pyelonephritis Active Problems:   UTI (lower urinary tract infection)   Acute renal failure (ARF) (HCC)   Failure to thrive in adult   Weight loss   Tobacco abuse   Hypoxia   Dehydration, moderate   COPD (chronic obstructive pulmonary disease) (HCC)   Fall   Lactic acidosis   Malnutrition of moderate degree   Pressure ulcer    Follow-up recommendations Follow-up with PCP in 3-5 days , including all  additional recommended appointments as below Follow-up CBC, CMP in 3-5 days Patient being discharged with home health PCP to further investigate patient's anemia with age appropriate screening    Medication List    STOP taking these medications        ZZZQUIL 50 MG/30ML Liqd  Generic drug:  DiphenhydrAMINE HCl      TAKE these medications        allopurinol 300 MG tablet  Commonly known as:  ZYLOPRIM  Take 300 mg by mouth daily.     aspirin 81 MG EC tablet  Take 1 tablet (81 mg total) by mouth daily.     ciprofloxacin 250 MG tablet  Commonly known as:  CIPRO  Take 1 tablet (250 mg total) by mouth 2 (two) times daily.     feeding supplement (ENSURE ENLIVE) Liqd  Take 237 mLs by mouth 3 (three) times daily between meals.     guaiFENesin 600 MG 12 hr tablet  Commonly known as:  MUCINEX  Take 2 tablets (1,200 mg total) by mouth 2 (two) times daily.     nicotine 14 mg/24hr patch  Commonly known as:  NICODERM CQ - dosed in mg/24 hours  Place 1 patch (14 mg total) onto the skin daily.     nitroGLYCERIN 0.2 mg/hr patch  Commonly known as:  NITRODUR - Dosed in mg/24 hr  Place 0.2 mg onto the skin daily.     polyethylene glycol packet  Commonly known as:  MIRALAX / GLYCOLAX  Take 17 g by mouth daily as needed for mild constipation.     predniSONE 10 MG tablet  Commonly known as:  DELTASONE  4 tablets 3 days, 3 tablets 3 days, 2 tablets 3 days, 1 tablet 3 days, half tablet 3 days     zolpidem 5 MG tablet  Commonly known as:  AMBIEN  Take 1 tablet (5 mg total) by mouth at bedtime as needed for sleep.         Discharge Condition: Stable   Discharge Instructions       Discharge Instructions    Diet - low sodium heart healthy    Complete by:  As directed      Increase activity slowly    Complete by:  As directed            No Known Allergies    Disposition: 01-Home or Self Care   Consults:  None     Significant Diagnostic Studies:  Dg Chest 1 View  06/23/2015  CLINICAL DATA:  Pain following fall EXAM: CHEST 1 VIEW COMPARISON:  March 01, 2014 FINDINGS: There is no edema or consolidation. The heart size and pulmonary vascularity are normal. No adenopathy. There is atherosclerotic calcification in the aorta. No bone lesions. No pneumothorax. IMPRESSION: No edema or consolidation. Electronically Signed  By: Lowella Grip III M.D.   On: 06/23/2015 14:41   Dg Thoracic Spine 2 View  06/23/2015  CLINICAL DATA:  Fall 1 day ago with persistent back pain, initial encounter EXAM: THORACIC SPINE 2 VIEWS COMPARISON:  03/01/1949 FINDINGS: Vertebral body height is well maintained. Multilevel degenerative changes are seen. No acute compression deformity is noted. No paraspinal mass lesion is noted. IMPRESSION: Degenerative change without acute abnormality. Electronically Signed   By: Inez Catalina M.D.   On: 06/23/2015 14:48   Dg Lumbar Spine 2-3 Views  06/23/2015  CLINICAL DATA:  Pain following fall EXAM: LUMBAR SPINE - 2-3 VIEW COMPARISON:  None. FINDINGS: Frontal, lateral, and spot lumbosacral lateral images were obtained. The there are 5 non-rib-bearing lumbar type vertebral bodies. There is no fracture or spondylolisthesis. There is moderately severe disc space narrowing at L5-S1. There is mild disc  space narrowing at L3-4. There are anterior and lateral osteophytes at all levels. There is extensive atherosclerotic calcification in the aorta and iliac arteries. Calcification is also noted in the splenic artery and peripheral right renal artery. 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 No fracture or spondylolisthesis. Extensive atherosclerotic calcification at multiple sites. Electronically Signed   By: Lowella Grip III M.D.   On: 06/23/2015 14:47   Ct Head Wo Contrast  06/23/2015  CLINICAL DATA:  Altered mental status.  Fall. EXAM: CT HEAD WITHOUT CONTRAST TECHNIQUE: Contiguous axial images were obtained from the base of the skull through the vertex without intravenous contrast. COMPARISON:  None. FINDINGS: There is atrophy and chronic small vessel disease changes. No acute intracranial abnormality. Specifically, no hemorrhage, hydrocephalus, mass lesion, acute infarction, or significant intracranial injury. No acute calvarial abnormality. IMPRESSION: No acute intracranial abnormality. Electronically Signed   By: Rolm Baptise M.D.   On: 06/23/2015 15:40   US Renal  06/23/2015  CLINICAL DATA:  74 year old male with pyelonephritis. EXAM: RENAL / URINARY TRACT ULTRASOUND COMPLETE COMPARISON:  Abdominal radiograph dated 06/23/2015 FINDINGS: Right Kidney: Length: 13.9 cm. Two nonobstructing stones each measuring approximately 4 mm noted in the interpolar aspect of the right kidney. There is no hydronephrosis. There is a 9 x 8 x 7 mm hypoechoic exophytic structure from the superior cortex of the right kidney, likely a cyst. Left Kidney: Length: 12.7 cm. There is thinning of the renal cortex with increased echogenicity. There is no hydronephrosis or echogenic stone. Bladder: Layering debris noted within the urinary bladder. The spleen is enlarged measuring 14 cm in greatest dimension. IMPRESSION: Small nonobstructing right renal calculi.  No hydronephrosis. Left renal cortical atrophy.  No  hydronephrosis or echogenic stone. Echogenic debris within the urinary bladder. Correlation with urinalysis recommended. Splenomegaly. Electronically Signed   By: Anner Crete M.D.   On: 06/23/2015 21:54        Filed Weights   06/24/15 1201 06/25/15 0506 06/26/15 0455  Weight: 81 kg (178 lb 9.2 oz) 81.6 kg (179 lb 14.3 oz) 79.107 kg (174 lb 6.4 oz)     Microbiology: Recent Results (from the past 240 hour(s))  Urine culture     Status: None   Collection Time: 06/23/15  1:32 PM  Result Value Ref Range Status   Specimen Description URINE, CATHETERIZED  Final   Special Requests NONE  Final   Culture >=100,000 COLONIES/mL ENTEROBACTER CLOACAE  Final   Report Status 06/25/2015 FINAL  Final   Organism ID, Bacteria ENTEROBACTER CLOACAE  Final      Susceptibility   Enterobacter cloacae - MIC*  CEFTRIAXONE <=1 SENSITIVE Sensitive     CIPROFLOXACIN <=0.25 SENSITIVE Sensitive     GENTAMICIN <=1 SENSITIVE Sensitive     IMIPENEM <=0.25 SENSITIVE Sensitive     NITROFURANTOIN 32 SENSITIVE Sensitive     TRIMETH/SULFA <=20 SENSITIVE Sensitive     PIP/TAZO <=4 SENSITIVE Sensitive     * >=100,000 COLONIES/mL ENTEROBACTER CLOACAE  Blood culture (routine x 2)     Status: None (Preliminary result)   Collection Time: 06/23/15  1:54 PM  Result Value Ref Range Status   Specimen Description BLOOD RIGHT ANTECUBITAL  Final   Special Requests BOTTLES DRAWN AEROBIC AND ANAEROBIC 5CC  Final   Culture NO GROWTH 2 DAYS  Final   Report Status PENDING  Incomplete  Blood culture (routine x 2)     Status: None (Preliminary result)   Collection Time: 06/23/15  3:51 PM  Result Value Ref Range Status   Specimen Description BLOOD BLOOD RIGHT FOREARM  Final   Special Requests IN PEDIATRIC BOTTLE 3CC  Final   Culture NO GROWTH 2 DAYS  Final   Report Status PENDING  Incomplete       Blood Culture    Component Value Date/Time   SDES BLOOD BLOOD RIGHT FOREARM 06/23/2015 1551   SPECREQUEST IN PEDIATRIC  BOTTLE 3CC 06/23/2015 1551   CULT NO GROWTH 2 DAYS 06/23/2015 1551   REPTSTATUS PENDING 06/23/2015 1551      Labs: Results for orders placed or performed during the hospital encounter of 06/23/15 (from the past 48 hour(s))  Comprehensive metabolic panel     Status: Abnormal   Collection Time: 06/25/15  3:16 AM  Result Value Ref Range   Sodium 136 135 - 145 mmol/L   Potassium 5.3 (H) 3.5 - 5.1 mmol/L   Chloride 109 101 - 111 mmol/L   CO2 20 (L) 22 - 32 mmol/L   Glucose, Bld 141 (H) 65 - 99 mg/dL   BUN 51 (H) 6 - 20 mg/dL   Creatinine, Ser 2.12 (H) 0.61 - 1.24 mg/dL   Calcium 8.0 (L) 8.9 - 10.3 mg/dL   Total Protein 5.2 (L) 6.5 - 8.1 g/dL   Albumin 2.3 (L) 3.5 - 5.0 g/dL   AST 20 15 - 41 U/L   ALT 16 (L) 17 - 63 U/L   Alkaline Phosphatase 42 38 - 126 U/L   Total Bilirubin 0.4 0.3 - 1.2 mg/dL   GFR calc non Af Amer 29 (L) >60 mL/min   GFR calc Af Amer 34 (L) >60 mL/min    Comment: (NOTE) The eGFR has been calculated using the CKD EPI equation. This calculation has not been validated in all clinical situations. eGFR's persistently <60 mL/min signify possible Chronic Kidney Disease.    Anion gap 7 5 - 15  CK     Status: None   Collection Time: 06/25/15  3:16 AM  Result Value Ref Range   Total CK 199 49 - 397 U/L  CBC     Status: Abnormal   Collection Time: 06/25/15  3:16 AM  Result Value Ref Range   WBC 5.5 4.0 - 10.5 K/uL   RBC 3.45 (L) 4.22 - 5.81 MIL/uL   Hemoglobin 7.7 (L) 13.0 - 17.0 g/dL   HCT 24.9 (L) 39.0 - 52.0 %   MCV 72.2 (L) 78.0 - 100.0 fL   MCH 22.3 (L) 26.0 - 34.0 pg   MCHC 30.9 30.0 - 36.0 g/dL   RDW 17.7 (H) 11.5 - 15.5 %   Platelets 150 150 -  400 K/uL  CBC     Status: Abnormal   Collection Time: 06/26/15  4:23 AM  Result Value Ref Range   WBC 6.3 4.0 - 10.5 K/uL   RBC 3.49 (L) 4.22 - 5.81 MIL/uL   Hemoglobin 7.7 (L) 13.0 - 17.0 g/dL   HCT 25.4 (L) 39.0 - 52.0 %   MCV 72.8 (L) 78.0 - 100.0 fL   MCH 22.1 (L) 26.0 - 34.0 pg   MCHC 30.3 30.0 - 36.0  g/dL   RDW 17.7 (H) 11.5 - 15.5 %   Platelets 157 150 - 400 K/uL  Comprehensive metabolic panel     Status: Abnormal   Collection Time: 06/26/15  4:23 AM  Result Value Ref Range   Sodium 136 135 - 145 mmol/L   Potassium 6.0 (H) 3.5 - 5.1 mmol/L   Chloride 107 101 - 111 mmol/L   CO2 24 22 - 32 mmol/L   Glucose, Bld 109 (H) 65 - 99 mg/dL   BUN 44 (H) 6 - 20 mg/dL   Creatinine, Ser 1.64 (H) 0.61 - 1.24 mg/dL   Calcium 8.0 (L) 8.9 - 10.3 mg/dL   Total Protein 5.2 (L) 6.5 - 8.1 g/dL   Albumin 2.2 (L) 3.5 - 5.0 g/dL   AST 20 15 - 41 U/L   ALT 20 17 - 63 U/L   Alkaline Phosphatase 43 38 - 126 U/L   Total Bilirubin 0.4 0.3 - 1.2 mg/dL   GFR calc non Af Amer 40 (L) >60 mL/min   GFR calc Af Amer 46 (L) >60 mL/min    Comment: (NOTE) The eGFR has been calculated using the CKD EPI equation. This calculation has not been validated in all clinical situations. eGFR's persistently <60 mL/min signify possible Chronic Kidney Disease.    Anion gap 5 5 - 15     Lipid Panel  No results found for: CHOL, TRIG, HDL, CHOLHDL, VLDL, LDLCALC, LDLDIRECT   No results found for: HGBA1C   Lab Results  Component Value Date   CREATININE 1.64* 06/26/2015     HPI :* 74 y.o. male, with COPD, gout, and a history of critical lower limb ischemia, who presents to the ER after being found down and unable to get up by his family.His nephew noticed on Saturday that he was feeling poorly and gave him Day Quil. He also noticed that his legs were cold and he was eating much less. On Sunday he fell after he lost his balance and his family wanted to bring him to the ER but he refused. During the night the patient must of gotten up and fallen because he was found on the floor this morning by his niece at approximately 11 AM. The patient has no recollection of getting up or falling. He was unable to stand on his own. He was found in a large amount of feces. His nephew notes he has recently become incontinent of urine,  his speech has become slurred, and at times has been very confused.  the emergency department his white count is 13.9, lactic acid is 2.03, creatinine is 3.21 increased from a baseline of 1.0. CK is 784. Urine appears positive for infection. CT head shows no acute abnormality. Chest x-ray shows no acute infection. X-rays of the lumbar spine show arthritic change most marked at L5-S1 but no fracture.    HOSPITAL COURSE:    Acute Pyelonephritis- patient started on Rocephin, UA showed too numerous to count WBC. Patient does complain of dysuria. Urine culture  shows Enterobacter, sensitive to Cipro, patient was continued on Rocephin until discharge and then switch to Cipro 7 days  Acute kidney injury- patient's baseline creatinine is around 1, he presented with creatinine of 3.21 with BUN 52. Started on IV normal saline at 100 mL per hour. Today creatinine is improved to 1.62, patient needs a repeat BMP in 3-5 days  Metabolic acidosis- likely from dehydration and acute kidney injury. Resolved   Mild rhabdomyolysis- patient was found on the floor unable to get up. Mild elevation of CK. After IV fluid CK is down to 199.   Hyperkalemia, potassium 6.0 on 12/8, patient received Kayexalate, repeat potassium at 1 PM, if less than 5, patient will be discharged home today  COPD exacerbation-improved, patient started on prednisone, when necessary nebulizer, Mucinex. Continue prednisone taper  Microcytic anemia, hemoglobin remained stable, patient would need age-appropriate screening, will defer further workup to PCP, baseline hemoglobin around 10.0  Tobacco abuse- counseled, continue nicotine patch  Protein calorie malnutrition- history of weight loss, abdomen 2.8. Nutrition consulted. Patient started on Ensure 3 times a day.  DVT prophylaxis- Lovenox   Discharge Exam:    Blood pressure 143/70, pulse 66, temperature 97.4 F (36.3 C), temperature source Oral, resp. rate 19, height 6' 4"  (1.93 m),  weight 79.107 kg (174 lb 6.4 oz), SpO2 97 %.   General: Appears in no acute distress  Cardiovascular: S1-S2 regular  Respiratory: Clear to auscultation bilaterally  Abdomen: Soft, nontender, no organomegaly  Musculoskeletal: No cyanosis/clubbing/edema of the lower extremities    Follow-up Information    Follow up with [facility].   Why:  3:1   Contact information:   [address] [place] [address] [phone]       Follow up with [facility].   Why:  physical therapy and social work   Sport and exercise psychologist information:   [address] [place] [address] [phone]       Follow up with Imelda Pillow, NP. Schedule an appointment as soon as possible for a visit in 3 days.   Contact information:   [facility] [place] [address] [phone]       Signed: Reyne Dumas 06/26/2015, 10:05 AM        Time spent >45 mins

## 2015-06-28 LAB — CULTURE, BLOOD (ROUTINE X 2)
CULTURE: NO GROWTH
CULTURE: NO GROWTH

## 2015-07-04 ENCOUNTER — Emergency Department (HOSPITAL_COMMUNITY)
Admission: EM | Admit: 2015-07-04 | Discharge: 2015-07-04 | Disposition: A | Discharge status: Home / Self Care | Payer: Medicare HMO | Attending: Emergency Medicine | Admitting: Emergency Medicine

## 2015-07-04 ENCOUNTER — Encounter (HOSPITAL_COMMUNITY): Payer: Self-pay | Admitting: *Deleted

## 2015-07-04 DIAGNOSIS — N12 Tubulo-interstitial nephritis, not specified as acute or chronic: Secondary | ICD-10-CM | POA: Diagnosis not present

## 2015-07-04 DIAGNOSIS — Z792 Long term (current) use of antibiotics: Secondary | ICD-10-CM | POA: Insufficient documentation

## 2015-07-04 DIAGNOSIS — J449 Chronic obstructive pulmonary disease, unspecified: Secondary | ICD-10-CM | POA: Insufficient documentation

## 2015-07-04 DIAGNOSIS — Z79899 Other long term (current) drug therapy: Secondary | ICD-10-CM | POA: Diagnosis not present

## 2015-07-04 DIAGNOSIS — F17210 Nicotine dependence, cigarettes, uncomplicated: Secondary | ICD-10-CM | POA: Diagnosis not present

## 2015-07-04 DIAGNOSIS — M1990 Unspecified osteoarthritis, unspecified site: Secondary | ICD-10-CM | POA: Diagnosis not present

## 2015-07-04 DIAGNOSIS — F329 Major depressive disorder, single episode, unspecified: Secondary | ICD-10-CM | POA: Diagnosis not present

## 2015-07-04 DIAGNOSIS — Z7982 Long term (current) use of aspirin: Secondary | ICD-10-CM | POA: Insufficient documentation

## 2015-07-04 DIAGNOSIS — Z8679 Personal history of other diseases of the circulatory system: Secondary | ICD-10-CM | POA: Insufficient documentation

## 2015-07-04 DIAGNOSIS — M109 Gout, unspecified: Secondary | ICD-10-CM | POA: Diagnosis not present

## 2015-07-04 DIAGNOSIS — D72829 Elevated white blood cell count, unspecified: Secondary | ICD-10-CM

## 2015-07-04 DIAGNOSIS — R531 Weakness: Secondary | ICD-10-CM | POA: Diagnosis present

## 2015-07-04 DIAGNOSIS — Z87440 Personal history of urinary (tract) infections: Secondary | ICD-10-CM | POA: Insufficient documentation

## 2015-07-04 LAB — BASIC METABOLIC PANEL
Anion gap: 8 (ref 5–15)
BUN: 21 mg/dL — ABNORMAL HIGH (ref 6–20)
CALCIUM: 8.3 mg/dL — AB (ref 8.9–10.3)
CO2: 26 mmol/L (ref 22–32)
CREATININE: 1.27 mg/dL — AB (ref 0.61–1.24)
Chloride: 103 mmol/L (ref 101–111)
GFR, EST NON AFRICAN AMERICAN: 54 mL/min — AB (ref >60)
Glucose, Bld: 131 mg/dL — ABNORMAL HIGH (ref 65–99)
Potassium: 4.9 mmol/L (ref 3.5–5.1)
SODIUM: 137 mmol/L (ref 135–145)

## 2015-07-04 LAB — LACTIC ACID, PLASMA: LACTIC ACID, VENOUS: 1.3 mmol/L (ref 0.5–2.0)

## 2015-07-04 LAB — CBC
HCT: 34.2 % — ABNORMAL LOW (ref 39.0–52.0)
HEMOGLOBIN: 10.2 g/dL — AB (ref 13.0–17.0)
MCH: 22.8 pg — ABNORMAL LOW (ref 26.0–34.0)
MCHC: 29.8 g/dL — AB (ref 30.0–36.0)
MCV: 76.3 fL — ABNORMAL LOW (ref 78.0–100.0)
PLATELETS: 587 10*3/uL — AB (ref 150–400)
RBC: 4.48 MIL/uL (ref 4.22–5.81)
RDW: 20.4 % — AB (ref 11.5–15.5)
WBC: 20.4 10*3/uL — ABNORMAL HIGH (ref 4.0–10.5)

## 2015-07-04 LAB — DIFFERENTIAL
BAND NEUTROPHILS: 0 %
BASOS ABS: 0 10*3/uL (ref 0.0–0.1)
Basophils Relative: 0 %
Blasts: 0 %
EOS ABS: 0 10*3/uL (ref 0.0–0.7)
Eosinophils Relative: 0 %
LYMPHS PCT: 8 %
Lymphs Abs: 1.6 10*3/uL (ref 0.7–4.0)
MONO ABS: 4.1 10*3/uL — AB (ref 0.1–1.0)
MYELOCYTES: 0 %
Metamyelocytes Relative: 0 %
Monocytes Relative: 20 %
NEUTROS PCT: 72 %
Neutro Abs: 14.7 10*3/uL — ABNORMAL HIGH (ref 1.7–7.7)
Other: 0 %
PROMYELOCYTES ABS: 0 %
nRBC: 0 /100 WBC

## 2015-07-04 LAB — URINE MICROSCOPIC-ADD ON

## 2015-07-04 LAB — URINALYSIS, ROUTINE W REFLEX MICROSCOPIC
BILIRUBIN URINE: NEGATIVE
GLUCOSE, UA: NEGATIVE mg/dL
KETONES UR: NEGATIVE mg/dL
Nitrite: NEGATIVE
PROTEIN: NEGATIVE mg/dL
Specific Gravity, Urine: 1.013 (ref 1.005–1.030)
pH: 6 (ref 5.0–8.0)

## 2015-07-04 MED ORDER — CEPHALEXIN 500 MG PO CAPS: 500.0000 mg | ORAL_CAPSULE | Freq: Four times a day (QID) | ORAL | Status: DC

## 2015-07-04 NOTE — ED Provider Notes (Signed)
CSN: [id number]     Arrival date & time 07/04/15  1707 History   First MD Initiated Contact with Patient 07/04/15 2005     Chief Complaint  Patient presents with  . Urinary Tract Infection     (Consider location/radiation/quality/duration/timing/severity/associated sxs/prior Treatment) HPI Comments: Patient with a history of COPD (stopped smoking after recent admission), LE ischemia, with recent admission for UTI, acute kidney injury, and weakness. During follow up with PCP he was found to have persistent urinary bacteria, although, negative cultures (per daughter) in the setting of a significant leukocytosis and was sent back to the emergency department for further evaluation. He denies pain anywhere. No cough. He states he feels well and has wanted to maintain a normal diet. No cough, SOB, fever, nausea, vomiting or diarrhea. No further falls. He has been working with in-home health PT and has been doing well.   Patient is a 74 y.o. male presenting with urinary tract infection. The history is provided by the patient and a relative. No language interpreter was used.  Urinary Tract Infection Pertinent negatives include no abdominal pain, chest pain, chills, coughing, fever, myalgias, nausea, vomiting or weakness.    Past Medical History  Diagnosis Date  . Gout   . COPD (chronic obstructive pulmonary disease) (HCC)     LONG TIME SMOKER  . 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 Lajoyce Cornersuda   . Critical lower limb ischemia   . Pyelonephritis 06/23/2015  . Arthritis     "hands" (06/23/2015)  . Depression     "periods of depression" (06/23/2015)  . Adult failure to thrive     /notes 06/23/2015   Past Surgical History  Procedure Laterality Date  . Knee cartilage surgery Left 1960's    football injury  . Amputation Right 03/01/2014    Procedure: AMPUTATION RAY;  Surgeon: Nadara MustardMarcus Duda V, MD;  [facility];  Service: Orthopedics;  Laterality: Right;  Right Foot 5th Ray  Amputation  . Amputation Right 04/26/2014    Procedure: Right Foot 4th Ray Amputation;  Surgeon: Nadara MustardMarcus Duda V, MD;  [facility];  Service: Orthopedics;  Laterality: Right;  . Lower extremity angiogram N/A 05/27/2014    Procedure: LOWER EXTREMITY ANGIOGRAM;  Surgeon: Runell GessJonathan J Berry, MD;  [facility];  Service: Cardiovascular;  Laterality: N/A;  . Inguinal hernia repair Left   . Knee ligament reconstruction Left 1960's  . Cataract extraction w/ intraocular lens  implant, bilateral Bilateral    Family History  Problem Relation Age of Onset  . Heart failure Father   . Heart attack Father   . Heart failure Brother    Social History  Substance Use Topics  . Smoking status: Current Every Day Smoker -- 0.50 packs/day for 57 years    Types: Cigarettes  . Smokeless tobacco: Never Used  . Alcohol Use: Yes     Comment: 06/23/2015 "was drinking alot of beer; quit in 2015"    Review of Systems  Constitutional: Negative for fever, chills, activity change and appetite change.  HENT: Negative.   Respiratory: Negative.  Negative for cough and shortness of breath.   Cardiovascular: Negative.  Negative for chest pain.  Gastrointestinal: Negative.  Negative for nausea, vomiting and abdominal pain.  Genitourinary: Negative.  Negative for dysuria, frequency and flank pain.  Musculoskeletal: Negative.  Negative for myalgias.  Skin: Negative.  Negative for wound.  Neurological: Negative.  Negative for dizziness, speech difficulty, weakness and light-headedness.  Psychiatric/Behavioral: Negative for confusion.      Allergies  Review of patient's allergies indicates no known allergies.  Home Medications   Prior to Admission medications   Medication Sig Start Date End Date Taking? Authorizing Provider  allopurinol (ZYLOPRIM) 300 MG tablet Take 300 mg by mouth daily.  02/11/14   Historical Provider, MD  aspirin EC 81 MG EC tablet Take 1 tablet (81 mg total) by mouth daily. 06/26/15    Richarda Overlie, MD  ciprofloxacin (CIPRO) 250 MG tablet Take 1 tablet (250 mg total) by mouth 2 (two) times daily. 06/26/15   Richarda Overlie, MD  feeding supplement, ENSURE ENLIVE, (ENSURE ENLIVE) LIQD Take 237 mLs by mouth 3 (three) times daily between meals. 06/26/15   Richarda Overlie, MD  guaiFENesin (MUCINEX) 600 MG 12 hr tablet Take 2 tablets (1,200 mg total) by mouth 2 (two) times daily. 06/26/15   Richarda Overlie, MD  nicotine (NICODERM CQ - DOSED IN MG/24 HOURS) 14 mg/24hr patch Place 1 patch (14 mg total) onto the skin daily. 06/26/15   Richarda Overlie, MD  nitroGLYCERIN (NITRODUR - DOSED IN MG/24 HR) 0.2 mg/hr patch Place 0.2 mg onto the skin daily.    Historical Provider, MD  polyethylene glycol (MIRALAX / GLYCOLAX) packet Take 17 g by mouth daily as needed for mild constipation. 06/26/15   Richarda Overlie, MD  predniSONE (DELTASONE) 10 MG tablet 4 tablets 3 days, 3 tablets 3 days, 2 tablets 3 days, 1 tablet 3 days, half tablet 3 days 06/26/15   Richarda Overlie, MD  traZODone (DESYREL) 50 MG tablet Take 0.5 tablets (25 mg total) by mouth at bedtime as needed for sleep. 06/26/15   Richarda Overlie, MD   BP 151/86 mmHg  Pulse 60  Temp(Src) 97.4 F (36.3 C) (Oral)  Resp 20  Ht  (1.93 m)  Wt 79.379 kg  BMI 21.31 kg/m2  SpO2 98% Physical Exam  Constitutional: He is oriented to person, place, and time. He appears well-developed and well-nourished.  HENT:  Head: Normocephalic.  Eyes: Conjunctivae are normal.  Neck: Normal range of motion. Neck supple.  Cardiovascular: Normal rate and regular rhythm.   No murmur heard. Pulmonary/Chest: Effort normal and breath sounds normal. He has no wheezes. He has no rales.  Abdominal: Soft. Bowel sounds are normal. There is no tenderness. There is no rebound and no guarding.  Musculoskeletal: Normal range of motion.  Neurological: He is alert and oriented to person, place, and time.  Skin: Skin is warm and dry. No rash noted.  Psychiatric: He has a normal mood and  affect.    ED Course  Procedures (including critical care time) Labs Review Labs Reviewed  CBC - Abnormal; Notable for the following:    WBC 20.4 (*)    Hemoglobin 10.2 (*)    HCT 34.2 (*)    MCV 76.3 (*)    MCH 22.8 (*)    MCHC 29.8 (*)    RDW 20.4 (*)    Platelets 587 (*)    All other components within normal limits  BASIC METABOLIC PANEL - Abnormal; Notable for the following:    Glucose, Bld 131 (*)    BUN 21 (*)    Creatinine, Ser 1.27 (*)    Calcium 8.3 (*)    GFR calc non Af Amer 54 (*)    All other components within normal limits  URINE CULTURE  URINALYSIS, ROUTINE W REFLEX MICROSCOPIC (NOT AT [facility])  LACTIC ACID, PLASMA  LACTIC ACID, PLASMA  DIFFERENTIAL  Results for orders placed or performed during the hospital encounter of 07/04/15  Urinalysis, Routine w reflex microscopic-may I&O cath if menses (not at [facility])  Result Value Ref Range   Color, Urine YELLOW YELLOW   APPearance TURBID (A) CLEAR   Specific Gravity, Urine 1.013 1.005 - 1.030   pH 6.0 5.0 - 8.0   Glucose, UA NEGATIVE NEGATIVE mg/dL   Hgb urine dipstick SMALL (A) NEGATIVE   Bilirubin Urine NEGATIVE NEGATIVE   Ketones, ur NEGATIVE NEGATIVE mg/dL   Protein, ur NEGATIVE NEGATIVE mg/dL   Nitrite NEGATIVE NEGATIVE   Leukocytes, UA SMALL (A) NEGATIVE  CBC  Result Value Ref Range   WBC 20.4 (H) 4.0 - 10.5 K/uL   RBC 4.48 4.22 - 5.81 MIL/uL   Hemoglobin 10.2 (L) 13.0 - 17.0 g/dL   HCT 16.1 (L) 09.6 - 04.5 %   MCV 76.3 (L) 78.0 - 100.0 fL   MCH 22.8 (L) 26.0 - 34.0 pg   MCHC 29.8 (L) 30.0 - 36.0 g/dL   RDW 40.9 (H) 81.1 - 91.4 %   Platelets 587 (H) 150 - 400 K/uL  Basic metabolic panel  Result Value Ref Range   Sodium 137 135 - 145 mmol/L   Potassium 4.9 3.5 - 5.1 mmol/L   Chloride 103 101 - 111 mmol/L   CO2 26 22 - 32 mmol/L   Glucose, Bld 131 (H) 65 - 99 mg/dL   BUN 21 (H) 6 - 20 mg/dL   Creatinine, Ser 7.82 (H) 0.61 - 1.24 mg/dL   Calcium 8.3 (L) 8.9 - 10.3 mg/dL   GFR calc non Af Amer 54  (L) >60 mL/min   GFR calc Af Amer >60 >60 mL/min   Anion gap 8 5 - 15  Lactic acid, plasma  Result Value Ref Range   Lactic Acid, Venous 1.3 0.5 - 2.0 mmol/L  Differential  Result Value Ref Range   Neutrophils Relative % 72 %   Lymphocytes Relative 8 %   Monocytes Relative 20 %   Eosinophils Relative 0 %   Basophils Relative 0 %   Band Neutrophils 0 %   Metamyelocytes Relative 0 %   Myelocytes 0 %   Promyelocytes Absolute 0 %   Blasts 0 %   nRBC 0 0 /100 WBC   Other 0 %   Neutro Abs 14.7 (H) 1.7 - 7.7 K/uL   Lymphs Abs 1.6 0.7 - 4.0 K/uL   Monocytes Absolute 4.1 (H) 0.1 - 1.0 K/uL   Eosinophils Absolute 0.0 0.0 - 0.7 K/uL   Basophils Absolute 0.0 0.0 - 0.1 K/uL   RBC Morphology POLYCHROMASIA PRESENT    WBC Morphology MILD LEFT SHIFT (1-5% METAS, OCC MYELO, OCC BANDS)   Urine microscopic-add on  Result Value Ref Range   Squamous Epithelial / LPF 0-5 (A) NONE SEEN   WBC, UA TOO NUMEROUS TO COUNT 0 - 5 WBC/hpf   RBC / HPF 6-30 0 - 5 RBC/hpf   Bacteria, UA FEW (A) NONE SEEN   Casts HYALINE CASTS (A) NEGATIVE     Imaging Review No results found. I have personally reviewed and evaluated these images and lab results as part of my medical decision-making.   EKG Interpretation None      MDM   Final diagnoses:  None    1. Pyelonephritis 2. Leukocytosis   The patient is an elderly 74 yo with recent admission for pyelonephritis, AKI, anemia, weakness with mild rhabdomyolysis after fall with prolonged downtime before being found. Today  he is asked to come to ED by PCP because of abnormal lab studies on office follow up. He has no complaints. There is no pain, no new symptoms, no weakness, no falls. He is working with PT in-home and doing well. His appetite is normal and daughter corroborates full history. He has a significant leukocytosis here - repeated from history of same with PCP - a urine positive for persistent infection. Daughter reports urine culture and CXR through  PCP reported as negative. His vital signs here are normal, including rectal temperature, and a normal lactic acid. He is evaluated by Dr. Rubin Payor in the ED and is found stable for discharge home. Will change abx to Keflex (culture in hospital pan-sensitive). Encouraged to follow up with PCP Monday for recheck. Strict return precautions discussed.     Elpidio Anis, PA-C 07/04/15 2244  Benjiman Core, MD 07/04/15 [phone]

## 2015-07-04 NOTE — ED Notes (Signed)
Pt departed in NAD.  

## 2015-07-04 NOTE — Discharge Instructions (Signed)
STOP TAKING CIPRO AND START KEFLEX. FOLLOW UP WITH KIM MILLSAPS ON Monday FOR RECHECK. IF YOU DEVELOP ANY FEVER, PAIN, NAUSEA/VOMITING, WEAKNESS, CONFUSION OR NEW CONCERN, DO NOT HESITATE TO RETURN TO THE EMERGENCY DEPARTMENT FOR FURTHER MANAGEMENT.

## 2015-07-04 NOTE — ED Notes (Signed)
Pt was seen and treated recently for a UTI. Pt saw his dr today and was told to come here due to elevated WBC and bacteria in his urine.

## 2015-07-06 LAB — URINE CULTURE

## 2015-07-22 ENCOUNTER — Ambulatory Visit (INDEPENDENT_AMBULATORY_CARE_PROVIDER_SITE_OTHER): Payer: Medicare HMO | Admitting: Cardiovascular Disease

## 2015-07-22 ENCOUNTER — Encounter: Payer: Self-pay | Admitting: Cardiovascular Disease

## 2015-07-22 VITALS — BP 116/60 | HR 68 | Ht 76.0 in | Wt 167.0 lb

## 2015-07-22 DIAGNOSIS — I998 Other disorder of circulatory system: Secondary | ICD-10-CM | POA: Diagnosis not present

## 2015-07-22 DIAGNOSIS — Z720 Tobacco use: Secondary | ICD-10-CM

## 2015-07-22 DIAGNOSIS — I70229 Atherosclerosis of native arteries of extremities with rest pain, unspecified extremity: Secondary | ICD-10-CM

## 2015-07-22 NOTE — Assessment & Plan Note (Signed)
History of critical limb ischemia status post amputation of several toes on the left with angiogram performed 05/27/14 showing occluded SFAs and popliteal arteries bilaterally with intact tibial vessels filling by collaterals. I do not think that there will R options. I had initially arranged for him to undergo fem-tib bypass by Dr. Darrick PennaFields but ultimately with nitroglycerin patches his wounds healed without need for revascularization. He does complain of claudication.

## 2015-07-22 NOTE — Progress Notes (Signed)
07/22/2015 Caleb DuboisJerald J Hays   November 06, 1940  [id number]  Primary Physician Caleb Hays, Caleb MillinKIMBERLY M, NP Primary Cardiologist: Runell GessJonathan J. Analya Hays Caleb Hays,Caleb Hays,Caleb Hays, Caleb Hays   HPI:   Caleb Hays is a 75 year old thin appearing single Caucasian male with no children who is accompanied by his niece Caleb Hays today. He was referred by Caleb Hays for peripheral vascular evaluation. I last saw him in the office 07/23/14. He is retired Corporate investment bankerconstruction worker. His cardiovascular risk factors are notable for 60 pack years having quit 2 weeks ago. He also quit taking alcohol 2 years ago at which time he was drinking 6-8 beers a day. He has never had a heart attack or stroke, denies chest pain shortness of breath or claudication. He had a gangrenous right fifth toe with osteomyelitis and underwent amputation by Caleb Hays back in August and apparently again earlier this month. He had lower extremity arterial Doppler studies performed in our office 03/20/14 revealing ABIs in the 0.5 range bilaterally with occluded superficial femoral arteries and popliteal arteries bilaterally and one-vessel runoff on the right. I angiogram him on 05/27/14 confirming his anatomy with occluded superficial femoral arteries from the origin down to the below-the-knee popliteal artery with reconstitution of the anterior tibial and peroneal arteries. He was seen in consultation by Caleb Hays who graciously arranged expedited right fem-tib bypass grafting . He had a Myoview stress test that was low risk in anticipation of his surgery however ultimately this never came to pass. His wounds healed with transdermal nitroglycerin patches and conservative therapy.he does complain of claudication.   Current Outpatient Prescriptions  Medication Sig Dispense Refill  . albuterol (PROVENTIL HFA;VENTOLIN HFA) 108 (90 BASE) MCG/ACT inhaler Inhale 2 puffs into the lungs daily. Take every daily per niece    . traZODone (DESYREL) 50 MG tablet Take 0.5 tablets (25 mg  total) by mouth at bedtime as needed for sleep. 30 tablet 0   No current facility-administered medications for this visit.    No Known Allergies  Social History   Social History  . Marital Status: Single    Spouse Name: N/A  . Number of Children: N/A  . Years of Education: N/A   Occupational History  . Not on file.   Social History Main Topics  . Smoking status: Current Every Day Smoker -- 0.50 packs/day for 57 years    Types: Cigarettes  . Smokeless tobacco: Never Used  . Alcohol Use: Yes     Comment: 06/23/2015 "was drinking alot of beer; quit in 2015"  . Drug Use: No  . Sexual Activity: No   Other Topics Concern  . Not on file   Social History Narrative     Review of Systems: General: negative for chills, fever, night sweats or weight changes.  Cardiovascular: negative for chest pain, dyspnea on exertion, edema, orthopnea, palpitations, paroxysmal nocturnal dyspnea or shortness of breath Dermatological: negative for rash Respiratory: negative for cough or wheezing Urologic: negative for hematuria Abdominal: negative for nausea, vomiting, diarrhea, bright red blood per rectum, melena, or hematemesis Neurologic: negative for visual changes, syncope, or dizziness All other systems reviewed and are otherwise negative except as noted above.    Blood pressure 116/60, pulse 68, height 6\' 4"  (1.93 Hays), weight 167 lb (75.751 kg).  General appearance: alert and no distress Neck: no adenopathy, no carotid bruit, no JVD, supple, symmetrical, trachea midline and thyroid not enlarged, symmetric, no tenderness/mass/nodules Lungs: clear to auscultation bilaterally Heart: regular rate and rhythm, S1, S2 normal, no murmur,  click, rub or gallop Extremities: extremities normal, atraumatic, no cyanosis or edema  EKG not performed today  ASSESSMENT AND PLAN:   Tobacco abuse Recently discontinued  Critical lower limb ischemia History of critical limb ischemia status post  amputation of several toes on the left with angiogram performed 05/27/14 showing occluded SFAs and popliteal arteries bilaterally with intact tibial vessels filling by collaterals. I do not think that there will R options. I had initially arranged for him to undergo fem-tib bypass by Dr. Darrick [name] but ultimately with nitroglycerin patches his wounds healed without need for revascularization. He does complain of claudication.      Runell Gess Caleb FACP,Caleb Hays,Caleb Hays, [facility] 07/22/2015 3:56 PM

## 2015-07-22 NOTE — Patient Instructions (Signed)

## 2015-07-22 NOTE — Assessment & Plan Note (Signed)
Recently discontinued 

## 2015-08-05 ENCOUNTER — Encounter (HOSPITAL_COMMUNITY): Payer: Self-pay

## 2015-08-05 ENCOUNTER — Emergency Department (HOSPITAL_COMMUNITY)
Admission: EM | Admit: 2015-08-05 | Discharge: 2015-08-06 | Disposition: A | Discharge status: Home / Self Care | Payer: Medicare HMO | Attending: Emergency Medicine | Admitting: Emergency Medicine

## 2015-08-05 ENCOUNTER — Emergency Department (HOSPITAL_COMMUNITY): Payer: Medicare HMO

## 2015-08-05 DIAGNOSIS — L0390 Cellulitis, unspecified: Secondary | ICD-10-CM

## 2015-08-05 DIAGNOSIS — F17210 Nicotine dependence, cigarettes, uncomplicated: Secondary | ICD-10-CM | POA: Insufficient documentation

## 2015-08-05 DIAGNOSIS — Z8679 Personal history of other diseases of the circulatory system: Secondary | ICD-10-CM | POA: Diagnosis not present

## 2015-08-05 DIAGNOSIS — L03031 Cellulitis of right toe: Secondary | ICD-10-CM | POA: Diagnosis not present

## 2015-08-05 DIAGNOSIS — F329 Major depressive disorder, single episode, unspecified: Secondary | ICD-10-CM | POA: Diagnosis not present

## 2015-08-05 DIAGNOSIS — Z8739 Personal history of other diseases of the musculoskeletal system and connective tissue: Secondary | ICD-10-CM | POA: Diagnosis not present

## 2015-08-05 DIAGNOSIS — Z87448 Personal history of other diseases of urinary system: Secondary | ICD-10-CM | POA: Diagnosis not present

## 2015-08-05 DIAGNOSIS — L089 Local infection of the skin and subcutaneous tissue, unspecified: Secondary | ICD-10-CM | POA: Diagnosis present

## 2015-08-05 DIAGNOSIS — J449 Chronic obstructive pulmonary disease, unspecified: Secondary | ICD-10-CM | POA: Diagnosis not present

## 2015-08-05 DIAGNOSIS — Z79899 Other long term (current) drug therapy: Secondary | ICD-10-CM | POA: Diagnosis not present

## 2015-08-05 LAB — CBC WITH DIFFERENTIAL/PLATELET
Band Neutrophils: 0 %
Basophils Absolute: 0.1 10*3/uL (ref 0.0–0.1)
Basophils Relative: 2 %
Blasts: 0 %
EOS PCT: 2 %
Eosinophils Absolute: 0.1 10*3/uL (ref 0.0–0.7)
HEMATOCRIT: 35.6 % — AB (ref 39.0–52.0)
HEMOGLOBIN: 11.5 g/dL — AB (ref 13.0–17.0)
LYMPHS ABS: 2 10*3/uL (ref 0.7–4.0)
LYMPHS PCT: 38 %
MCH: 24.4 pg — ABNORMAL LOW (ref 26.0–34.0)
MCHC: 32.3 g/dL (ref 30.0–36.0)
MCV: 75.6 fL — ABNORMAL LOW (ref 78.0–100.0)
MONOS PCT: 6 %
MYELOCYTES: 0 %
Metamyelocytes Relative: 0 %
Monocytes Absolute: 0.3 10*3/uL (ref 0.1–1.0)
NEUTROS PCT: 52 %
NRBC: 0 /100{WBCs}
Neutro Abs: 2.8 10*3/uL (ref 1.7–7.7)
Other: 0 %
Platelets: 268 10*3/uL (ref 150–400)
Promyelocytes Absolute: 0 %
RBC: 4.71 MIL/uL (ref 4.22–5.81)
RDW: 19.3 % — ABNORMAL HIGH (ref 11.5–15.5)
WBC: 5.3 10*3/uL (ref 4.0–10.5)

## 2015-08-05 LAB — COMPREHENSIVE METABOLIC PANEL
ALT: 11 U/L — ABNORMAL LOW (ref 17–63)
ANION GAP: 11 (ref 5–15)
AST: 23 U/L (ref 15–41)
Albumin: 3.2 g/dL — ABNORMAL LOW (ref 3.5–5.0)
Alkaline Phosphatase: 67 U/L (ref 38–126)
BUN: 20 mg/dL (ref 6–20)
CHLORIDE: 104 mmol/L (ref 101–111)
CO2: 24 mmol/L (ref 22–32)
CREATININE: 1.33 mg/dL — AB (ref 0.61–1.24)
Calcium: 8.6 mg/dL — ABNORMAL LOW (ref 8.9–10.3)
GFR, EST AFRICAN AMERICAN: 60 mL/min — AB (ref >60)
GFR, EST NON AFRICAN AMERICAN: 51 mL/min — AB (ref >60)
Glucose, Bld: 103 mg/dL — ABNORMAL HIGH (ref 65–99)
POTASSIUM: 4.9 mmol/L (ref 3.5–5.1)
Sodium: 139 mmol/L (ref 135–145)
Total Bilirubin: 0.4 mg/dL (ref 0.3–1.2)
Total Protein: 6.2 g/dL — ABNORMAL LOW (ref 6.5–8.1)

## 2015-08-05 LAB — I-STAT CG4 LACTIC ACID, ED: Lactic Acid, Venous: 1.89 mmol/L (ref 0.5–2.0)

## 2015-08-05 MED ORDER — SULFAMETHOXAZOLE-TRIMETHOPRIM 800-160 MG PO TABS
1.0000 | ORAL_TABLET | Freq: Once | ORAL | Status: AC
  Administered 2015-08-05: 1 via ORAL
  Filled 2015-08-05: qty 1

## 2015-08-05 MED ORDER — SULFAMETHOXAZOLE-TRIMETHOPRIM 800-160 MG PO TABS: 1.0000 | ORAL_TABLET | Freq: Two times a day (BID) | ORAL | Status: DC

## 2015-08-05 NOTE — ED Notes (Signed)
Attempting to call family at this time.

## 2015-08-05 NOTE — Discharge Instructions (Signed)
He were seen and evaluated tonight for the infection of your great toe. At this time the infection seems to just be in the skin with some involvement under the nail. There is no sign of involvement of the bone or any gas under the skin. Here lab work is reassuring. Take the antibiotics prescribed and follow-up with Dr. Lajoyce Corners outpatient.  Cellulitis Cellulitis is an infection of the skin and the tissue beneath it. The infected area is usually red and tender. Cellulitis occurs most often in the arms and lower legs.  CAUSES  Cellulitis is caused by bacteria that enter the skin through cracks or cuts in the skin. The most common types of bacteria that cause cellulitis are staphylococci and streptococci. SIGNS AND SYMPTOMS   Redness and warmth.  Swelling.  Tenderness or pain.  Fever. DIAGNOSIS  Your health care provider can usually determine what is wrong based on a physical exam. Blood tests may also be done. TREATMENT  Treatment usually involves taking an antibiotic medicine. HOME CARE INSTRUCTIONS   Take your antibiotic medicine as directed by your health care provider. Finish the antibiotic even if you start to feel better.  Keep the infected arm or leg elevated to reduce swelling.  Apply a warm cloth to the affected area up to 4 times per day to relieve pain.  Take medicines only as directed by your health care provider.  Keep all follow-up visits as directed by your health care provider. SEEK MEDICAL CARE IF:   You notice red streaks coming from the infected area.  Your red area gets larger or turns dark in color.  Your bone or joint underneath the infected area becomes painful after the skin has healed.  Your infection returns in the same area or another area.  You notice a swollen bump in the infected area.  You develop new symptoms.  You have a fever. SEEK IMMEDIATE MEDICAL CARE IF:   You feel very sleepy.  You develop vomiting or diarrhea.  You have a general  ill feeling (malaise) with muscle aches and pains.   This information is not intended to replace advice given to you by your health care provider. Make sure you discuss any questions you have with your health care provider.   Document Released: 04/14/2005 Document Revised: 03/26/2015 Document Reviewed: 09/20/2011 Elsevier Interactive Patient Education [employer]

## 2015-08-05 NOTE — ED Notes (Signed)
MD at bedside updating patient.

## 2015-08-05 NOTE — ED Provider Notes (Signed)
CSN: [id number]     Arrival date & time 08/05/15  1434 History   First MD Initiated Contact with Patient 08/05/15 2019     Chief Complaint  Patient presents with  . great toe infection      (Consider location/radiation/quality/duration/timing/severity/associated sxs/prior Treatment) HPI Comments: 75 y.o. 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  The patient reports that he has been soaking his foot in epsom salt at home because he noted that he had some redness to the toe and that this has been going on for the last 2-3 days.  Per family when I talked with them on the phone they first noted the toe changes last night and took him to the PCP's office today where they were told to present to the ER.  Patient denies fever, chills, nausea, vomiting.  Feels otherwise well and says that he has had this managed outpatient in the past.   Past Medical History  Diagnosis Date  . Gout   . COPD (chronic obstructive pulmonary disease) (HCC)     LONG TIME SMOKER  . 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 Lajoyce Corners   . Critical lower limb ischemia   . Pyelonephritis 06/23/2015  . Arthritis     "hands" (06/23/2015)  . Depression     "periods of depression" (06/23/2015)  . Adult failure to thrive     /notes 06/23/2015   Past Surgical History  Procedure Laterality Date  . Knee cartilage surgery Left 1960's    football injury  . Amputation Right 03/01/2014    Procedure: AMPUTATION RAY;  Surgeon: Nadara Mustard, MD;  Location: [facility] OR;  Service: Orthopedics;  Laterality: Right;  Right Foot 5th Ray Amputation  . Amputation Right 04/26/2014    Procedure: Right Foot 4th Ray Amputation;  Surgeon: Nadara Mustard, MD;  Location: [facility] OR;  Service: Orthopedics;  Laterality: Right;  . Lower extremity angiogram N/A 05/27/2014    Procedure: LOWER EXTREMITY ANGIOGRAM;  Surgeon: Runell Gess, MD;  Location: [facility] CATH LAB;  Service: Cardiovascular;  Laterality: N/A;  . Inguinal hernia repair Left   . Knee ligament reconstruction Left 1960's  . Cataract extraction w/ intraocular lens  implant, bilateral Bilateral    Family History  Problem Relation Age of Onset  . Heart failure Father   . Heart attack Father   . Heart failure Brother    Social History  Substance Use Topics  . Smoking status: Current Every Day Smoker -- 0.50 packs/day for 57 years    Types: Cigarettes  . Smokeless tobacco: Never Used  . Alcohol Use: Yes     Comment: 06/23/2015 "was drinking alot of beer; quit in 2015"    Review of Systems  Constitutional: Negative for fever, chills and fatigue.  HENT: Negative for congestion and postnasal drip.   Respiratory: Negative for cough, chest tightness and shortness of breath.   Gastrointestinal: Negative for nausea, vomiting, abdominal pain and diarrhea.  Genitourinary: Negative for dysuria and hematuria.  Musculoskeletal: Negative for myalgias and back pain.  Skin: Positive for color change and wound.  Neurological: Negative for weakness and headaches.      Allergies  Review of patient's allergies indicates no known allergies.  Home Medications   Prior to Admission medications   Medication Sig Start Date End Date Taking? Authorizing Provider  albuterol (  PROVENTIL HFA;VENTOLIN HFA) 108 (90 BASE) MCG/ACT inhaler Inhale 2 puffs into the lungs daily. Take every daily per niece   Yes Historical Provider, MD  traZODone (DESYREL) 50 MG tablet Take 0.5 tablets (25 mg total) by mouth at bedtime as needed for sleep. Patient taking differently: Take 50 mg by mouth at bedtime.  06/26/15  Yes Richarda Overlie, MD  sulfamethoxazole-trimethoprim (BACTRIM DS,SEPTRA DS) 800-160 MG tablet Take 1 tablet by mouth 2 (two) times daily. 08/05/15 08/12/15  Leta Baptist, MD   BP 124/74 mmHg  Pulse 66  Temp(Src) 98 F (36.7 C) (Oral)  Resp 14  Ht  (1.93 m)  Wt 168  lb (76.204 kg)  BMI 20.46 kg/m2  SpO2 99% Physical Exam  Constitutional: He is oriented to person, place, and time. He appears well-developed and well-nourished. No distress.  HENT:  Head: Normocephalic and atraumatic.  Right Ear: External ear normal.  Left Ear: External ear normal.  Mouth/Throat: Oropharynx is clear and moist. No oropharyngeal exudate.  Eyes: EOM are normal. Pupils are equal, round, and reactive to light.  Neck: Normal range of motion. Neck supple.  Cardiovascular: Normal rate, regular rhythm and intact distal pulses.   Pulmonary/Chest: Effort normal. No respiratory distress. He has no wheezes. He has no rales.  Abdominal: Soft. He exhibits no distension. There is no tenderness.  Musculoskeletal: He exhibits no edema.       Right foot: There is normal capillary refill and no crepitus.       Feet:  Neurological: He is alert and oriented to person, place, and time. He exhibits normal muscle tone.  Skin: Skin is warm and dry. No rash noted. He is not diaphoretic.  Vitals reviewed.   ED Course  Procedures (including critical care time) Labs Review Labs Reviewed  COMPREHENSIVE METABOLIC PANEL - Abnormal; Notable for the following:    Glucose, Bld 103 (*)    Creatinine, Ser 1.33 (*)    Calcium 8.6 (*)    Total Protein 6.2 (*)    Albumin 3.2 (*)    ALT 11 (*)    GFR calc non Af Amer 51 (*)    GFR calc Af Amer 60 (*)    All other components within normal limits  CBC WITH DIFFERENTIAL/PLATELET - Abnormal; Notable for the following:    Hemoglobin 11.5 (*)    HCT 35.6 (*)    MCV 75.6 (*)    MCH 24.4 (*)    RDW 19.3 (*)    All other components within normal limits  I-STAT CG4 LACTIC ACID, ED    Imaging Review Dg Foot Complete Right  08/05/2015  CLINICAL DATA:  Wound on the great toe.  Redness, tenderness. EXAM: RIGHT FOOT COMPLETE - 3+ VIEW COMPARISON:  None. 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 Degenerative changes at the  first MTP joint and first metatarsal joints. No acute bony abnormality. Specifically, no fracture, subluxation, or dislocation. Soft tissues are intact. No radiographic changes of osteomyelitis. IMPRESSION: No acute bony abnormality. Electronically Signed   By: Charlett Nose M.D.   On: 08/05/2015 21:17   I have personally reviewed and evaluated these images and lab results as part of my medical decision-making.   EKG Interpretation None      MDM  Patient was seen and evaluated in stable condition.  Patient is a poor historian but states he has had this managed outpatient and feels like it can be now.  Xray without acute finding to suggest  more aggressive infection or osteomyelitis.  Lab work unremarkable.  Discussed on the phone with patient's family twice.  Explained results and plan for oral antibiotics and need for close follow up outpatient with the patient's surgeon, Dr. Lajoyce Corners.  The patient and family expressed understanding and agreement with plan of care and patient was discharged home in stable condition. Final diagnoses:  Cellulitis, unspecified cellulitis site, unspecified extremity site, unspecified laterality    1. Right great toe cellulitis    Leta Baptist, MD 08/06/15 [phone]

## 2015-08-05 NOTE — ED Notes (Signed)
Patient sent from [facility] for infection in right foot great toe, increased redness and drainage from under nail, has had previous amputations on same foot.

## 2015-08-06 NOTE — ED Notes (Signed)
Patient verbalized understanding of discharge instructions and denies any further needs or questions at this time. VS stable. Patient ambulatory with steady gait. Assisting patient to ED entrance in wheelchair where family is waiting to pick him up. Will go over d/c instructions with patient's niece as well.

## 2015-08-09 ENCOUNTER — Observation Stay (EMERGENCY_DEPARTMENT_HOSPITAL)
Admission: EM | Admit: 2015-08-09 | Discharge: 2015-08-11 | Disposition: A | Discharge status: Home / Self Care | Payer: Medicare HMO | Source: Home / Self Care | Attending: Emergency Medicine | Admitting: Emergency Medicine

## 2015-08-09 ENCOUNTER — Encounter (HOSPITAL_COMMUNITY): Payer: Self-pay | Admitting: Emergency Medicine

## 2015-08-09 ENCOUNTER — Emergency Department (HOSPITAL_COMMUNITY): Payer: Medicare HMO

## 2015-08-09 DIAGNOSIS — M1A9XX1 Chronic gout, unspecified, with tophus (tophi): Secondary | ICD-10-CM | POA: Clinically undetermined

## 2015-08-09 DIAGNOSIS — D638 Anemia in other chronic diseases classified elsewhere: Secondary | ICD-10-CM

## 2015-08-09 DIAGNOSIS — J42 Unspecified chronic bronchitis: Secondary | ICD-10-CM | POA: Diagnosis not present

## 2015-08-09 DIAGNOSIS — R700 Elevated erythrocyte sedimentation rate: Secondary | ICD-10-CM | POA: Insufficient documentation

## 2015-08-09 DIAGNOSIS — M19042 Primary osteoarthritis, left hand: Secondary | ICD-10-CM

## 2015-08-09 DIAGNOSIS — E790 Hyperuricemia without signs of inflammatory arthritis and tophaceous disease: Secondary | ICD-10-CM | POA: Diagnosis present

## 2015-08-09 DIAGNOSIS — M109 Gout, unspecified: Secondary | ICD-10-CM

## 2015-08-09 DIAGNOSIS — M25571 Pain in right ankle and joints of right foot: Secondary | ICD-10-CM | POA: Diagnosis not present

## 2015-08-09 DIAGNOSIS — M19041 Primary osteoarthritis, right hand: Secondary | ICD-10-CM | POA: Insufficient documentation

## 2015-08-09 DIAGNOSIS — Z87891 Personal history of nicotine dependence: Secondary | ICD-10-CM | POA: Insufficient documentation

## 2015-08-09 DIAGNOSIS — J449 Chronic obstructive pulmonary disease, unspecified: Secondary | ICD-10-CM | POA: Diagnosis present

## 2015-08-09 DIAGNOSIS — L03115 Cellulitis of right lower limb: Secondary | ICD-10-CM | POA: Insufficient documentation

## 2015-08-09 DIAGNOSIS — N179 Acute kidney failure, unspecified: Secondary | ICD-10-CM | POA: Insufficient documentation

## 2015-08-09 DIAGNOSIS — N189 Chronic kidney disease, unspecified: Secondary | ICD-10-CM

## 2015-08-09 DIAGNOSIS — M00071 Staphylococcal arthritis, right ankle and foot: Secondary | ICD-10-CM | POA: Diagnosis not present

## 2015-08-09 LAB — BASIC METABOLIC PANEL
Anion gap: 12 (ref 5–15)
BUN: 29 mg/dL — ABNORMAL HIGH (ref 6–20)
CHLORIDE: 107 mmol/L (ref 101–111)
CO2: 21 mmol/L — ABNORMAL LOW (ref 22–32)
CREATININE: 1.81 mg/dL — AB (ref 0.61–1.24)
Calcium: 8.5 mg/dL — ABNORMAL LOW (ref 8.9–10.3)
GFR calc non Af Amer: 35 mL/min — ABNORMAL LOW (ref >60)
GFR, EST AFRICAN AMERICAN: 41 mL/min — AB (ref >60)
Glucose, Bld: 109 mg/dL — ABNORMAL HIGH (ref 65–99)
POTASSIUM: 4.6 mmol/L (ref 3.5–5.1)
SODIUM: 140 mmol/L (ref 135–145)

## 2015-08-09 LAB — I-STAT CG4 LACTIC ACID, ED: LACTIC ACID, VENOUS: 1.36 mmol/L (ref 0.5–2.0)

## 2015-08-09 LAB — URIC ACID: URIC ACID, SERUM: 9.4 mg/dL — AB (ref 4.4–7.6)

## 2015-08-09 NOTE — ED Notes (Signed)
Patient transported to X-ray 

## 2015-08-09 NOTE — ED Notes (Signed)
Patient here via EMS with complaint of right ankle swelling and pain. Patient is several days post op from removal of ingrown toenail of right great toe. Over the past day the ankle of the same foot has become swollen and painful. EMS gave patient of IV Fentanyl PTA.

## 2015-08-09 NOTE — ED Provider Notes (Signed)
CSN: [id number]     Arrival date & time 08/09/15  2207 History   First MD Initiated Contact with Patient 08/09/15 2210     Chief Complaint  Patient presents with  . Ankle Pain  . Post-op Problem     (Consider location/radiation/quality/duration/timing/severity/associated sxs/prior Treatment) HPI Comments: Patient with right ankle pain and swelling. Patient reports having recently had an ingrown toenail removed. He states that over the past day or so he has had pain and swelling develop in the right ankle. He denies any fevers or chills. Denies any discharge from the toe. There are no aggravating or alleviating factors. He has not taken anything for her symptoms.  The history is provided by the patient. No language interpreter was used.    Past Medical History  Diagnosis Date  . Gout   . COPD (chronic obstructive pulmonary disease) (HCC)     LONG TIME SMOKER  . 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 Lajoyce Corners   . Critical lower limb ischemia   . Pyelonephritis 06/23/2015  . Arthritis     "hands" (06/23/2015)  . Depression     "periods of depression" (06/23/2015)  . Adult failure to thrive     /notes 06/23/2015   Past Surgical History  Procedure Laterality Date  . Knee cartilage surgery Left 1960's    football injury  . Amputation Right 03/01/2014    Procedure: AMPUTATION RAY;  Surgeon: Nadara Mustard, MD;  Location: [facility] OR;  Service: Orthopedics;  Laterality: Right;  Right Foot 5th Ray Amputation  . Amputation Right 04/26/2014    Procedure: Right Foot 4th Ray Amputation;  Surgeon: Nadara Mustard, MD;  Location: [facility] OR;  Service: Orthopedics;  Laterality: Right;  . Lower extremity angiogram N/A 05/27/2014    Procedure: LOWER EXTREMITY ANGIOGRAM;  Surgeon: Runell Gess, MD;  Location: [facility] CATH LAB;  Service: Cardiovascular;  Laterality: N/A;  . Inguinal hernia repair Left   . Knee ligament reconstruction Left 1960's  . Cataract extraction w/ intraocular lens   implant, bilateral Bilateral    Family History  Problem Relation Age of Onset  . Heart failure Father   . Heart attack Father   . Heart failure Brother    Social History  Substance Use Topics  . Smoking status: Former Smoker -- 0.50 packs/day for 57 years    Types: Cigarettes    Quit date: 12/12/2014  . Smokeless tobacco: Never Used  . Alcohol Use: Yes     Comment: 06/23/2015 "was drinking alot of beer; quit in 2015"    Review of Systems  Constitutional: Negative for fever and chills.  Respiratory: Negative for shortness of breath.   Cardiovascular: Negative for chest pain.  Gastrointestinal: Negative for nausea, vomiting, diarrhea and constipation.  Genitourinary: Negative for dysuria.  Musculoskeletal: Positive for arthralgias.  Skin: Positive for color change and wound.  All other systems reviewed and are negative.     Allergies  Review of patient's allergies indicates no known allergies.  Home Medications   Prior to Admission medications   Medication Sig Start Date End Date Taking? Authorizing Provider  albuterol (PROVENTIL HFA;VENTOLIN HFA) 108 (90 BASE) MCG/ACT inhaler Inhale 2 puffs into the lungs daily. Take every daily per niece    Historical Provider, MD  sulfamethoxazole-trimethoprim (BACTRIM DS,SEPTRA DS) 800-160 MG tablet Take 1 tablet by mouth 2 (two) times daily. 08/05/15 08/12/15  Leta Baptist, MD  traZODone (DESYREL) 50 MG tablet Take 0.5 tablets (  25 mg total) by mouth at bedtime as needed for sleep. Patient taking differently: Take 50 mg by mouth at bedtime.  06/26/15   Richarda Overlie, MD   BP 140/77 mmHg  Pulse 82  Temp(Src) 98 F (36.7 C) (Oral)  Resp 18  SpO2 95% Physical Exam  Constitutional: He is oriented to person, place, and time. He appears well-developed and well-nourished.  HENT:  Head: Normocephalic and atraumatic.  Eyes: Conjunctivae and EOM are normal. Pupils are equal, round, and reactive to light. Right eye exhibits no discharge.  Left eye exhibits no discharge. No scleral icterus.  Neck: Normal range of motion. Neck supple. No JVD present.  Cardiovascular: Normal rate, regular rhythm and normal heart sounds.  Exam reveals no gallop and no friction rub.   No murmur heard. Pulmonary/Chest: Effort normal and breath sounds normal. No respiratory distress. He has no wheezes. He has no rales. He exhibits no tenderness.  Abdominal: Soft. He exhibits no distension and no mass. There is no tenderness. There is no rebound and no guarding.  Musculoskeletal: Normal range of motion. He exhibits no edema or tenderness.  Right ankle tender to palpation, no bony abnormality or deformity, range of motion and strength 5/5  Neurological: He is alert and oriented to person, place, and time.  Skin: Skin is warm and dry.  Right great toe mildly erythematous, no obvious discharge  Psychiatric: He has a normal mood and affect. His behavior is normal. Judgment and thought content normal.  Nursing note and vitals reviewed.   ED Course  Procedures (including critical care time) Results for orders placed or performed during the hospital encounter of 08/09/15  CBC with Differential/Platelet  Result Value Ref Range   WBC 9.0 4.0 - 10.5 K/uL   RBC 3.91 (L) 4.22 - 5.81 MIL/uL   Hemoglobin 9.2 (L) 13.0 - 17.0 g/dL   HCT 16.1 (L) 09.6 - 04.5 %   MCV 74.9 (L) 78.0 - 100.0 fL   MCH 23.5 (L) 26.0 - 34.0 pg   MCHC 31.4 30.0 - 36.0 g/dL   RDW 40.9 (H) 81.1 - 91.4 %   Platelets 331 150 - 400 K/uL   Neutrophils Relative % 74 %   Lymphocytes Relative 13 %   Monocytes Relative 9 %   Eosinophils Relative 2 %   Basophils Relative 2 %   Neutro Abs 6.6 1.7 - 7.7 K/uL   Lymphs Abs 1.2 0.7 - 4.0 K/uL   Monocytes Absolute 0.8 0.1 - 1.0 K/uL   Eosinophils Absolute 0.2 0.0 - 0.7 K/uL   Basophils Absolute 0.2 (H) 0.0 - 0.1 K/uL   RBC Morphology POLYCHROMASIA PRESENT    WBC Morphology MILD LEFT SHIFT (1-5% METAS, OCC MYELO, OCC BANDS)   Basic metabolic  panel  Result Value Ref Range   Sodium 140 135 - 145 mmol/L   Potassium 4.6 3.5 - 5.1 mmol/L   Chloride 107 101 - 111 mmol/L   CO2 21 (L) 22 - 32 mmol/L   Glucose, Bld 109 (H) 65 - 99 mg/dL   BUN 29 (H) 6 - 20 mg/dL   Creatinine, Ser 7.82 (H) 0.61 - 1.24 mg/dL   Calcium 8.5 (L) 8.9 - 10.3 mg/dL   GFR calc non Af Amer 35 (L) >60 mL/min   GFR calc Af Amer 41 (L) >60 mL/min   Anion gap 12 5 - 15  Uric acid  Result Value Ref Range   Uric Acid, Serum 9.4 (H) 4.4 - 7.6 mg/dL  I-Stat CG4  Lactic Acid, ED  Result Value Ref Range   Lactic Acid, Venous 1.36 0.5 - 2.0 mmol/L   Dg Foot Complete Right  08/09/2015  CLINICAL DATA:  75 year old male with foot pain EXAM: RIGHT FOOT COMPLETE - 3+ VIEW COMPARISON:  Radiograph dated 08/05/2015 FINDINGS: Stable postsurgical changes of the fourth and transmetatarsal amputation. There is degenerative changes of the tarsometatarsal joints. The bones are osteopenic. No acute fracture. The soft tissues are unremarkable. IMPRESSION: No acute fracture.  No interval change. Electronically Signed   By: Elgie Collard M.D.   On: 08/09/2015 23:42   Dg Foot Complete Right  08/05/2015  CLINICAL DATA:  Wound on the great toe.  Redness, tenderness. EXAM: RIGHT FOOT COMPLETE - 3+ VIEW COMPARISON:  None. 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 Degenerative changes at the first MTP joint and first metatarsal joints. No acute bony abnormality. Specifically, no fracture, subluxation, or dislocation. Soft tissues are intact. No radiographic changes of osteomyelitis. IMPRESSION: No acute bony abnormality. Electronically Signed   By: Charlett Nose M.D.   On: 08/05/2015 21:17    I have personally reviewed and evaluated these images and lab results as part of my medical decision-making.   EKG Interpretation None      MDM   Final diagnoses:  Cellulitis of right lower extremity    Patient with recent right great toe cellulitis and ingrown  toenail removal. Now has developing swelling, pain, and redness in the right ankle. Concern for cellulitis. Will check labs. Anticipate IV antibiotics and admission.  Patient seen by and discussed with Dr. Preston Fleeting. Recommends admission for possible cellulitis and worsening creatinine.  Patient discussed with Dr. Lajoyce Corners from orthopedics, who will consult in the morning. States that there is a good possibility that this is gout, but agrees with plan to cover with vancomycin and put in the hospital.   Roxy Horseman, PA-C 08/10/15 0124  Cathren Laine, MD 08/10/15 [phone]

## 2015-08-10 DIAGNOSIS — D638 Anemia in other chronic diseases classified elsewhere: Secondary | ICD-10-CM | POA: Diagnosis not present

## 2015-08-10 DIAGNOSIS — E790 Hyperuricemia without signs of inflammatory arthritis and tophaceous disease: Secondary | ICD-10-CM | POA: Diagnosis present

## 2015-08-10 DIAGNOSIS — N179 Acute kidney failure, unspecified: Secondary | ICD-10-CM | POA: Diagnosis present

## 2015-08-10 DIAGNOSIS — M1A9XX1 Chronic gout, unspecified, with tophus (tophi): Secondary | ICD-10-CM | POA: Clinically undetermined

## 2015-08-10 DIAGNOSIS — L03115 Cellulitis of right lower limb: Secondary | ICD-10-CM | POA: Diagnosis present

## 2015-08-10 DIAGNOSIS — L0390 Cellulitis, unspecified: Secondary | ICD-10-CM | POA: Insufficient documentation

## 2015-08-10 DIAGNOSIS — J42 Unspecified chronic bronchitis: Secondary | ICD-10-CM

## 2015-08-10 DIAGNOSIS — M109 Gout, unspecified: Secondary | ICD-10-CM

## 2015-08-10 DIAGNOSIS — N189 Chronic kidney disease, unspecified: Secondary | ICD-10-CM

## 2015-08-10 LAB — CBC
HCT: 27.5 % — ABNORMAL LOW (ref 39.0–52.0)
Hemoglobin: 8.7 g/dL — ABNORMAL LOW (ref 13.0–17.0)
MCH: 23.9 pg — AB (ref 26.0–34.0)
MCHC: 31.6 g/dL (ref 30.0–36.0)
MCV: 75.5 fL — AB (ref 78.0–100.0)
PLATELETS: 278 10*3/uL (ref 150–400)
RBC: 3.64 MIL/uL — AB (ref 4.22–5.81)
RDW: 19.4 % — AB (ref 11.5–15.5)
WBC: 8.1 10*3/uL (ref 4.0–10.5)

## 2015-08-10 LAB — C-REACTIVE PROTEIN: CRP: 19.5 mg/dL — ABNORMAL HIGH (ref <1.0)

## 2015-08-10 LAB — CBC WITH DIFFERENTIAL/PLATELET
BASOS PCT: 2 %
Basophils Absolute: 0.2 10*3/uL — ABNORMAL HIGH (ref 0.0–0.1)
EOS PCT: 2 %
Eosinophils Absolute: 0.2 10*3/uL (ref 0.0–0.7)
HCT: 29.3 % — ABNORMAL LOW (ref 39.0–52.0)
HEMOGLOBIN: 9.2 g/dL — AB (ref 13.0–17.0)
Lymphocytes Relative: 13 %
Lymphs Abs: 1.2 10*3/uL (ref 0.7–4.0)
MCH: 23.5 pg — AB (ref 26.0–34.0)
MCHC: 31.4 g/dL (ref 30.0–36.0)
MCV: 74.9 fL — AB (ref 78.0–100.0)
Monocytes Absolute: 0.8 10*3/uL (ref 0.1–1.0)
Monocytes Relative: 9 %
NEUTROS ABS: 6.6 10*3/uL (ref 1.7–7.7)
Neutrophils Relative %: 74 %
Platelets: 331 10*3/uL (ref 150–400)
RBC: 3.91 MIL/uL — ABNORMAL LOW (ref 4.22–5.81)
RDW: 19.5 % — ABNORMAL HIGH (ref 11.5–15.5)
WBC: 9 10*3/uL (ref 4.0–10.5)

## 2015-08-10 LAB — BASIC METABOLIC PANEL
Anion gap: 11 (ref 5–15)
BUN: 25 mg/dL — AB (ref 6–20)
CHLORIDE: 107 mmol/L (ref 101–111)
CO2: 21 mmol/L — AB (ref 22–32)
CREATININE: 1.6 mg/dL — AB (ref 0.61–1.24)
Calcium: 8.2 mg/dL — ABNORMAL LOW (ref 8.9–10.3)
GFR calc Af Amer: 48 mL/min — ABNORMAL LOW (ref >60)
GFR calc non Af Amer: 41 mL/min — ABNORMAL LOW (ref >60)
Glucose, Bld: 113 mg/dL — ABNORMAL HIGH (ref 65–99)
Potassium: 4.5 mmol/L (ref 3.5–5.1)
Sodium: 139 mmol/L (ref 135–145)

## 2015-08-10 LAB — CK: CK TOTAL: 21 U/L — AB (ref 49–397)

## 2015-08-10 LAB — SEDIMENTATION RATE: Sed Rate: 71 mm/hr — ABNORMAL HIGH (ref 0–16)

## 2015-08-10 LAB — IRON AND TIBC
IRON: 17 ug/dL — AB (ref 45–182)
SATURATION RATIOS: 11 % — AB (ref 17.9–39.5)
TIBC: 157 ug/dL — AB (ref 250–450)
UIBC: 140 ug/dL

## 2015-08-10 LAB — I-STAT CG4 LACTIC ACID, ED: LACTIC ACID, VENOUS: 1.06 mmol/L (ref 0.5–2.0)

## 2015-08-10 MED ORDER — ONDANSETRON HCL 4 MG PO TABS: 4.0000 mg | ORAL_TABLET | Freq: Four times a day (QID) | ORAL | Status: DC | PRN

## 2015-08-10 MED ORDER — VANCOMYCIN HCL IN DEXTROSE 750-5 MG/150ML-% IV SOLN
750.0000 mg | INTRAVENOUS | Status: DC
  Filled 2015-08-10: qty 150

## 2015-08-10 MED ORDER — ACETAMINOPHEN 325 MG PO TABS: 650.0000 mg | ORAL_TABLET | Freq: Four times a day (QID) | ORAL | Status: DC | PRN

## 2015-08-10 MED ORDER — VANCOMYCIN HCL 10 G IV SOLR
1500.0000 mg | Freq: Once | INTRAVENOUS | Status: AC
  Administered 2015-08-10: 1500 mg via INTRAVENOUS
  Filled 2015-08-10: qty 1500

## 2015-08-10 MED ORDER — HEPARIN SODIUM (PORCINE) 5000 UNIT/ML IJ SOLN
5000.0000 [IU] | Freq: Three times a day (TID) | INTRAMUSCULAR | Status: DC
Start: 2015-08-10 — End: 2015-08-11
  Administered 2015-08-10 – 2015-08-11 (×3): 5000 [IU] via SUBCUTANEOUS
  Filled 2015-08-10 (×4): qty 1

## 2015-08-10 MED ORDER — SODIUM CHLORIDE 0.9 % IV SOLN
INTRAVENOUS | Status: DC
  Administered 2015-08-10 – 2015-08-11 (×3): via INTRAVENOUS

## 2015-08-10 MED ORDER — SODIUM CHLORIDE 0.9 % IV BOLUS (SEPSIS)
1000.0000 mL | Freq: Once | INTRAVENOUS | Status: AC
Start: 2015-08-10 — End: 2015-08-10
  Administered 2015-08-10: 1000 mL via INTRAVENOUS

## 2015-08-10 MED ORDER — HYDROCODONE-ACETAMINOPHEN 5-325 MG PO TABS
1.0000 | ORAL_TABLET | ORAL | Status: DC | PRN
  Administered 2015-08-10 – 2015-08-11 (×5): 1 via ORAL
  Filled 2015-08-10 (×5): qty 1

## 2015-08-10 MED ORDER — TRAZODONE HCL 50 MG PO TABS
50.0000 mg | ORAL_TABLET | Freq: Every day | ORAL | Status: DC
  Administered 2015-08-10 (×2): 50 mg via ORAL
  Filled 2015-08-10 (×2): qty 1

## 2015-08-10 MED ORDER — ALBUTEROL SULFATE (2.5 MG/3ML) 0.083% IN NEBU: 2.5000 mg | INHALATION_SOLUTION | RESPIRATORY_TRACT | Status: DC | PRN

## 2015-08-10 MED ORDER — ALBUTEROL SULFATE (2.5 MG/3ML) 0.083% IN NEBU
3.0000 mL | INHALATION_SOLUTION | Freq: Two times a day (BID) | RESPIRATORY_TRACT | Status: DC
  Administered 2015-08-10 (×2): 3 mL via RESPIRATORY_TRACT
  Filled 2015-08-10 (×3): qty 3

## 2015-08-10 MED ORDER — ACETAMINOPHEN 650 MG RE SUPP: 650.0000 mg | Freq: Four times a day (QID) | RECTAL | Status: DC | PRN

## 2015-08-10 MED ORDER — COLCHICINE 0.6 MG PO TABS
0.6000 mg | ORAL_TABLET | Freq: Two times a day (BID) | ORAL | Status: DC
  Administered 2015-08-10 – 2015-08-11 (×3): 0.6 mg via ORAL
  Filled 2015-08-10 (×3): qty 1

## 2015-08-10 MED ORDER — ONDANSETRON HCL 4 MG/2ML IJ SOLN: 4.0000 mg | Freq: Four times a day (QID) | INTRAMUSCULAR | Status: DC | PRN

## 2015-08-10 MED ORDER — MORPHINE SULFATE (PF) 4 MG/ML IV SOLN
4.0000 mg | Freq: Once | INTRAVENOUS | Status: AC
  Administered 2015-08-10: 4 mg via INTRAVENOUS
  Filled 2015-08-10: qty 1

## 2015-08-10 NOTE — ED Provider Notes (Signed)
75 year old male had in his right first toenail removed in his orthopedic surgeon's office about 3 days ago. Today, he has noted some redness and swelling of the ankle. He has previously had 2 toes dictated from the same foot. No fever or chills. On exam, there is definite swelling and erythema of the left ankle and symptoms erythema of the medial aspect of the right foot. Capillary refill is delayed to about 5 seconds. Nail bed has some crusting but no drainage. He likely will need IV antibiotics in attempt to avoid amputating the big toe.  Medical screening examination/treatment/procedure(s) were conducted as a shared visit with non-physician practitioner(s) and myself.  I personally evaluated the patient during the encounter.    Dione Booze, MD 08/10/15 [phone]

## 2015-08-10 NOTE — H&P (Signed)
[facility] History and Physical  Caleb Hays ZOX:[id number] DOB: 1940/10/05 DOA: 08/09/2015  Referring physician: ED PCP: Egbert Garibaldi, NP   Chief Complaint:    HPI:  Caleb Hays is a 75 year old male with a past medical history significant for gout, limb ischemia s/p amputations, COPD, Gout; who presents with redness and swelling of his right foot going up into his ankle. Patient recently states he went to orthopedics office had his right first digit  ingrown toenail removed  in the orthopedic office by one of Dr. Audrie Lia partners 3 days ago. Patient notes that since that time spent having progressively worsening swelling in the last day and half and noticed redness creeping up from the foot to his ankle. Patient notes that his pain has a 5 out of 10 on the pain scale and comes and goes.   On admission to the emergency department patient was evaluated and seen have elevated uric acid level of 9.4. Orthopedic was consulted and will see patient in a.m.  Review of Systems  Constitutional: Positive for weight loss. Negative for fever and chills.  HENT: Negative for congestion and nosebleeds.   Eyes: Negative for double vision and photophobia.  Respiratory: Positive for shortness of breath.   Cardiovascular: Positive for leg swelling. Negative for palpitations.  Gastrointestinal: Negative for abdominal pain and diarrhea.  Genitourinary: Negative for urgency and frequency.  Musculoskeletal: Positive for joint pain and falls.  Skin: Positive for rash.  Neurological: Negative for tremors, sensory change, focal weakness, loss of consciousness and headaches.  Endo/Heme/Allergies: Negative for environmental allergies and polydipsia.  Psychiatric/Behavioral: Negative for suicidal ideas and substance abuse.        Past Medical History  Diagnosis Date  . Gout   . COPD (chronic obstructive pulmonary disease) (HCC)     LONG TIME SMOKER  . Status post foot surgery     right  fifth toe amputation by Dr. Lajoyce Corners   . Critical lower limb ischemia   . Pyelonephritis 06/23/2015  . Arthritis     "hands" (06/23/2015)  . Depression     "periods of depression" (06/23/2015)  . Adult failure to thrive     /notes 06/23/2015     Past Surgical History  Procedure Laterality Date  . Knee cartilage surgery Left 1960's    football injury  . Amputation Right 03/01/2014    Procedure: AMPUTATION RAY;  Surgeon: Nadara Mustard, MD;  Location: [facility] OR;  Service: Orthopedics;  Laterality: Right;  Right Foot 5th Ray Amputation  . Amputation Right 04/26/2014    Procedure: Right Foot 4th Ray Amputation;  Surgeon: Nadara Mustard, MD;  Location[facility] OR;  Service: Orthopedics;  Laterality: Right;  . Lower extremity angiogram N/A 05/27/2014    Procedure: LOWER EXTREMITY ANGIOGRAM;  Surgeon: Runell Gess, MD;  Location[facility] CATH LAB;  Service: Cardiovascular;  Laterality: N/A;  . Inguinal hernia repair Left   . Knee ligament reconstruction Left 1960's  . Cataract extraction w/ intraocular lens  implant, bilateral Bilateral       Social History:  reports that he quit smoking about 7 months ago. His smoking use included Cigarettes. He has a 28.5 pack-year smoking history. He has never used smokeless tobacco. He reports that he drinks alcohol. He reports that he does not use illicit drugs.   No Known Allergies  Family History  Problem Relation Age of Onset  . Heart failure Father   . Heart attack Father   . Heart failure Brother  Prior to Admission medications   Medication Sig Start Date End Date Taking? Authorizing Provider  albuterol (PROVENTIL HFA;VENTOLIN HFA) 108 (90 BASE) MCG/ACT inhaler Inhale 2 puffs into the lungs 2 (two) times daily. Take every daily per niece   Yes Historical Provider, MD  sulfamethoxazole-trimethoprim (BACTRIM DS,SEPTRA DS) 800-160 MG tablet Take 1 tablet by mouth 2 (two) times daily. 08/05/15 08/12/15 Yes Leta Baptist, MD  traZODone (DESYREL) 50 MG  tablet Take 0.5 tablets (25 mg total) by mouth at bedtime as needed for sleep. Patient taking differently: Take 50 mg by mouth at bedtime.  06/26/15  Yes Richarda Overlie, MD     Physical Exam: Filed Vitals:   08/09/15 2230 08/09/15 2245 08/09/15 2341 08/09/15 2345  BP: 142/76 128/62 139/76 133/68  Pulse: 77 75 72 65  Temp:   97.7 F (36.5 C)   TempSrc:   Oral   Resp:   18   SpO2: 94% 95% 95% 98%     Constitutional: Vital signs reviewed. Patient is a frail elderly male that appears to be chronically ill. Alert and oriented x3.  Head: Normocephalic and atraumatic  Ear: TM normal bilaterally  Mouth: no erythema or exudates, MMM  Eyes: PERRL, EOMI, conjunctivae normal, No scleral icterus.  Neck: Supple, Trachea midline normal ROM, No JVD, mass, thyromegaly, or carotid bruit present.  Cardiovascular: RRR, S1 normal, S2 normal, no MRG, pulses symmetric and intact bilaterally  Pulmonary/Chest: CTAB, no wheezes, rales, or rhonchi  Abdominal: Soft. Non-tender, non-distended, bowel sounds are normal, no masses, organomegaly, or guarding present.  GU: no CVA tenderness Musculoskeletal: No joint deformities, erythema, or stiffness, ROM full and no nontender Ext:  amputations of the fourth and fifth toes of the right foot. Hematology: no cervical, inginal, or axillary adenopathy.  Neurological: A&O x3, Strenght is normal and symmetric bilaterally, cranial nerve II-XII are grossly intact, no focal motor deficit, sensory intact to light touch bilaterally.  Skin: Right first toe is mildly erythematous with purulent-like discharge with palpation on exam. Redness swelling to the right ankle.  Psychiatric: Normal mood and affect. speech and behavior is normal. Judgment and thought content normal. Cognition and memory are normal.      Data Review   Micro Results No results found for this or any previous visit (from the past 240 hour(s)).  Radiology Reports Dg Foot Complete Right  08/09/2015   CLINICAL DATA:  75 year old male with foot pain EXAM: RIGHT FOOT COMPLETE - 3+ VIEW COMPARISON:  Radiograph dated 08/05/2015 FINDINGS: Stable postsurgical changes of the fourth and transmetatarsal amputation. There is degenerative changes of the tarsometatarsal joints. The bones are osteopenic. No acute fracture. The soft tissues are unremarkable. IMPRESSION: No acute fracture.  No interval change. Electronically Signed   By: Elgie Collard M.D.   On: 08/09/2015 23:42   Dg Foot Complete Right  08/05/2015  CLINICAL DATA:  Wound on the great toe.  Redness, tenderness. EXAM: RIGHT FOOT COMPLETE - 3+ VIEW COMPARISON:  None. 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 Degenerative changes at the first MTP joint and first metatarsal joints. No acute bony abnormality. Specifically, no fracture, subluxation, or dislocation. Soft tissues are intact. No radiographic changes of osteomyelitis. IMPRESSION: No acute bony abnormality. Electronically Signed   By: Charlett Nose M.D.   On: 08/05/2015 21:17     CBC  Recent Labs Lab 08/05/15 1505 08/09/15 2251  WBC 5.3 9.0  HGB 11.5* 9.2*  HCT 35.6* 29.3*  PLT 268 331  MCV  75.6* 74.9*  MCH 24.4* 23.5*  MCHC 32.3 31.4  RDW 19.3* 19.5*  LYMPHSABS 2.0 1.2  MONOABS 0.3 0.8  EOSABS 0.1 0.2  BASOSABS 0.1 0.2*    Chemistries   Recent Labs Lab 08/05/15 1505 08/09/15 2251  NA 139 140  K 4.9 4.6  CL 104 107  CO2 24 21*  GLUCOSE 103* 109*  BUN 20 29*  CREATININE 1.33* 1.81*  CALCIUM 8.6* 8.5*  AST 23  --   ALT 11*  --   ALKPHOS 67  --   BILITOT 0.4  --    ------------------------------------------------------------------------------------------------------------------ estimated creatinine clearance is 39.2 mL/min (by C-G formula based on Cr of 1.81). ------------------------------------------------------------------------------------------------------------------ No results for input(s): HGBA1C in the last 72  hours. ------------------------------------------------------------------------------------------------------------------ No results for input(s): CHOL, HDL, LDLCALC, TRIG, CHOLHDL, LDLDIRECT in the last 72 hours. ------------------------------------------------------------------------------------------------------------------ No results for input(s): TSH, T4TOTAL, T3FREE, THYROIDAB in the last 72 hours.  Invalid input(s): FREET3 ------------------------------------------------------------------------------------------------------------------ No results for input(s): VITAMINB12, FOLATE, FERRITIN, TIBC, IRON, RETICCTPCT in the last 72 hours.  Coagulation profile No results for input(s): INR, PROTIME in the last 168 hours.  No results for input(s): DDIMER in the last 72 hours.  Cardiac Enzymes No results for input(s): CKMB, TROPONINI, MYOGLOBIN in the last 168 hours.  Invalid input(s): CK ------------------------------------------------------------------------------------------------------------------ Invalid input(s): POCBNP   CBG: No results for input(s): GLUCAP in the last 168 hours.       Assessment/Plan Principal Problem:    Cellulitis of right foot: Acute. Patient with acute redness and swelling of the right foot following a ingrown toenail removal. Suspect possible cellulitis although this could be acute gout flare as his uric acid level was seen to be elevated at 9.4. -Admit to a med surge bed - Check wound culture - Vancomycin IV - Orthopedics consult and to see in a.m.     Acute kidney injury superimposed on chronic kidney disease [facility]): Patient presents with a creatinine of 1.81 and BUN of 29, review of previous lab values reveal a  baseline creatinine 1.27-1.33. - Bolus 1 L then IV fluids of normal saline, at 75 mL per hour - Recheck BMP in a.m.-  - Avoid nephrotoxic agents  History of gout /Elevated uric acid level: Uric acid level was 9.4 on admission but  cannot give NSAIDs due to the patient's acute kidney injury.    Anemia: hemoglobin 9.2 on admission with low MCV and MCH - Check iron studies and replace if low - Check guaiac stool - Continue to monitor  COPD (chronic obstructive pulmonary disease) (HCC): chronic. O2 sats maintain on room air - Albuterol as needed - [place] if needed to keep O2 sats greater than 92%  Heparin: DVT prophylaxis   Code Status:   full Family Communication: bedside Disposition Plan: admit   Total time spent 55 minutes.Greater than 50% of this time was spent in counseling, explanation of diagnosis, planning of further management, and coordination of care  Clydie Braun [facility] Pager [phone]  If 7PM-7AM, please contact night-coverage [email or web address] Password [facility] 08/10/2015, 2:14 AM

## 2015-08-10 NOTE — Progress Notes (Signed)
Received to room from ED.  Oriented to room and routine.  Requesting medication for pain and his nightly sleeping medicine.

## 2015-08-10 NOTE — Progress Notes (Signed)
[facility] PLAN OF CARE NOTE  Patient: Caleb Hays   WUJ:[id number]  PCP: Egbert Garibaldi, NP  DOB: 1941-01-20  DOA: 08/09/2015    DOS: 08/10/2015   Patient was admitted by my colleague Dr. Katrinka Blazing earlier on 08/10/2015. I have reviewed the H&P as well as assessment and plan and agree with the same, other than Important changes which are listed below.  Plan of care: Principal Problem:   Cellulitis of right foot Gout. Appreciate input from Dr. Lajoyce Corners. Patient appears to have gout without any evidence of cellulitis. We'll discontinue antibiotics. Continue colchicine per recommendation from Dr. Lajoyce Corners. Monitor for renal function due to acute kidney injury. Physical therapy consultation.  Author: Lynden Oxford, MD [facility] Pager: [phone] 08/10/2015 2:01 PM   If 7PM-7AM, please contact night-coverage at [email or web address], password [email or web address]

## 2015-08-10 NOTE — Evaluation (Signed)
Physical Therapy Evaluation Patient Details Name: Caleb Hays MRN: [id number] DOB: 30-Jan-1941 Today's Date: 08/10/2015   History of Present Illness  Mr. Caleb Hays is a 75 year old male with a past medical history significant for gout, limb ischemia s/p amputations, COPD, Gout; who presents with redness and swelling of his right foot going up into his ankle.   Clinical Impression   Pt admitted with above diagnosis. Pt currently with functional limitations due to the deficits listed below (see PT Problem List).  Pt will benefit from skilled PT to increase their independence and safety with mobility to allow discharge to the venue listed below.       Follow Up Recommendations SNF    Equipment Recommendations  Rolling walker with 5" wheels;3in1 (PT);Wheelchair (measurements PT);Wheelchair cushion (measurements PT)    Recommendations for Other Services       Precautions / Restrictions Precautions Precautions: Fall Restrictions Weight Bearing Restrictions: No (however he does not tolerate any weight on R foot)      Mobility  Bed Mobility Overal bed mobility: Needs Assistance Bed Mobility: Supine to Sit;Sit to Supine     Supine to sit: Min assist Sit to supine: Min assist   General bed mobility comments: Min assist for RLE supprot coming off/onto the bed  Transfers Overall transfer level: Needs assistance Equipment used: Rolling walker (2 wheeled) Transfers: Sit to/from Stand Sit to Stand: +2 physical assistance;Mod assist         General transfer comment: Heavy mod assist to rise on LLE as he did not tolerate any weight on the right; minimal tolerance of upright standing, and he needed to sit back down rather quickly; heavy mod assist to control descent  Ambulation/Gait             General Gait Details: Unable today  Stairs            Wheelchair Mobility    Modified Rankin (Stroke Patients Only)       Balance Overall balance assessment: Needs  assistance   Sitting balance-Leahy Scale: Fair     Standing balance support: Bilateral upper extremity supported Standing balance-Leahy Scale: Zero                               Pertinent Vitals/Pain Pain Assessment: Faces Faces Pain Scale: Hurts whole lot Pain Location: R foot with any touching, unwilling to bear weight Pain Descriptors / Indicators: Aching;Grimacing;Guarding Pain Intervention(s): Limited activity within patient's tolerance;Monitored during session;Repositioned    Home Living Family/patient expects to be discharged to:: Private residence Living Arrangements: Other relatives Available Help at Discharge: Family;Available 24 hours/day Type of Home: House Home Access: Stairs to enter Entrance Stairs-Rails: Doctor, general practice of Steps: 6 Home Layout: One level Home Equipment: Walker - 2 wheels;Shower seat      Prior Function Level of Independence: Independent;Needs assistance   Gait / Transfers Assistance Needed: independent, but tells me he has mostly been in the bed since last admission  ADL's / Homemaking Assistance Needed: requires assistance with IADLs  Comments: ambulation is very limited due to LE claudication     Hand Dominance   Dominant Hand: Right    Extremity/Trunk Assessment   Upper Extremity Assessment: Generalized weakness           Lower Extremity Assessment: Generalized weakness;RLE deficits/detail RLE Deficits / Details: noted some swelling and erythema L Great toe and ankle with minimal serosanguinous drainage; toenail had been removed prior to  admission, and noted scabbing present       Communication   Communication: HOH  Cognition Arousal/Alertness: Awake/alert Behavior During Therapy: WFL for tasks assessed/performed Overall Cognitive Status: Within Functional Limits for tasks assessed                      General Comments      Exercises        Assessment/Plan    PT  Assessment Patient needs continued PT services  PT Diagnosis Difficulty walking;Generalized weakness;Acute pain   PT Problem List Decreased strength;Decreased range of motion;Decreased activity tolerance;Decreased balance;Decreased mobility;Decreased knowledge of use of DME;Decreased safety awareness;Decreased knowledge of precautions;Pain  PT Treatment Interventions DME instruction;Gait training;Functional mobility training;Therapeutic activities;Therapeutic exercise;Stair training;Balance training;Patient/family education;Wheelchair mobility training   PT Goals (Current goals can be found in the Care Plan section) Acute Rehab PT Goals Patient Stated Goal: to get better PT Goal Formulation: With patient Time For Goal Achievement: 08/24/15 Potential to Achieve Goals: Good    Frequency Min 3X/week   Barriers to discharge Decreased caregiver support Pt voiced concern that his niece has been having more difficulty providing care for him    Co-evaluation               End of Session Equipment Utilized During Treatment: Gait belt Activity Tolerance: Patient limited by pain;Patient limited by fatigue Patient left: in bed;with call bell/phone within reach Nurse Communication: Mobility status    Functional Assessment Tool Used: Clinical judgement Functional Limitation: Mobility: Walking and moving around Mobility: Walking and Moving Around Current Status (Z6109): At least 40 percent but less than 60 percent impaired, limited or restricted Mobility: Walking and Moving Around Goal Status [phone]): At least 1 percent but less than 20 percent impaired, limited or restricted    Time: 0981-1914 PT Time Calculation (min) (ACUTE ONLY): 14 min   Charges:   PT Evaluation $PT Eval Moderate Complexity: 1 Procedure     PT G Codes:   PT G-Codes **NOT FOR INPATIENT CLASS** Functional Assessment Tool Used: Clinical judgement Functional Limitation: Mobility: Walking and moving around Mobility:  Walking and Moving Around Current Status (N8295): At least 40 percent but less than 60 percent impaired, limited or restricted Mobility: Walking and Moving Around Goal Status [phone]): At least 1 percent but less than 20 percent impaired, limited or restricted    Van Clines [facility] 08/10/2015, 4:56 PM  Van Clines, PT  Acute Rehabilitation Services Pager [phone] Office [phone]

## 2015-08-10 NOTE — Progress Notes (Signed)
ANTIBIOTIC CONSULT NOTE - INITIAL  Pharmacy Consult for Vancomycin  Indication: Cellulitis  No Known Allergies  Patient Measurements: 76 kg  Vital Signs: Temp: 98 F (36.7 C) (01/22 0318) Temp Source: Oral (01/22 0318) BP: 152/68 mmHg (01/22 0318) Pulse Rate: 66 (01/22 0318)  Labs:  Recent Labs  08/09/15 2251  WBC 9.0  HGB 9.2*  PLT 331  CREATININE 1.81*   Estimated Creatinine Clearance: 39.2 mL/min (by C-G formula based on Cr of 1.81).  Medical History: Past Medical History  Diagnosis Date  . Gout   . COPD (chronic obstructive pulmonary disease) (HCC)     LONG TIME SMOKER  . 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 Lajoyce Corners   . Critical lower limb ischemia   . Pyelonephritis 06/23/2015  . Arthritis     "hands" (06/23/2015)  . Depression     "periods of depression" (06/23/2015)  . Adult failure to thrive     /notes 06/23/2015    Assessment: Starting vancomycin per pharmacy for right foot cellulitis, WBC WNL, acute on chronic kidney disease, other labs reviewed.   Goal of Therapy:  Vancomycin trough level 10-15 mcg/ml  Plan:  -Vancomycin 750 mg IV q24h -Trend WBC, temp, renal function  -Drug levels at steady state  Caleb Hays 08/10/2015,3:48 AM

## 2015-08-10 NOTE — Consult Note (Signed)
Reason for Consult: Gout right ankle Referring Physician: Dr. [name] is an 75 y.o. male.  HPI: Patient is 75 year old gentleman with multiple medical problems who is status post removal of the right great toenail secondary to infection. Patient developed acute redness and swelling around the right ankle wall sleeping at night. Patient is been unable to weight-bear or move his ankle due to pain.  Past Medical History  Diagnosis Date  . Gout   . COPD (chronic obstructive pulmonary disease) (HCC)     LONG TIME SMOKER  . 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 Sharol Given   . Critical lower limb ischemia   . Pyelonephritis 06/23/2015  . Arthritis     "hands" (06/23/2015)  . Depression     "periods of depression" (06/23/2015)  . Adult failure to thrive     /notes 06/23/2015    Past Surgical History  Procedure Laterality Date  . Knee cartilage surgery Left 1960's    football injury  . Amputation Right 03/01/2014    Procedure: AMPUTATION RAY;  Surgeon: Newt Minion, MD;  Location: [facility] [place];  Service: Orthopedics;  Laterality: Right;  Right Foot 5th Ray Amputation  . Amputation Right 04/26/2014    Procedure: Right Foot 4th Ray Amputation;  Surgeon: Newt Minion, MD;  Location: [place];  Service: Orthopedics;  Laterality: Right;  . Lower extremity angiogram N/A 05/27/2014    Procedure: LOWER EXTREMITY ANGIOGRAM;  Surgeon: Lorretta Harp, MD;  Location: [facility] CATH LAB;  Service: Cardiovascular;  Laterality: N/A;  . Inguinal hernia repair Left   . Knee ligament reconstruction Left 1960's  . Cataract extraction w/ intraocular lens  implant, bilateral Bilateral     Family History  Problem Relation Age of Onset  . Heart failure Father   . Heart attack Father   . Heart failure Brother     Social History:  reports that he quit smoking about 7 months ago. His smoking use included Cigarettes. He has a 28.5 pack-year smoking history. He has never used smokeless  tobacco. He reports that he drinks alcohol. He reports that he does not use illicit drugs.  Allergies: No Known Allergies  Medications: I have reviewed the patient's current medications.  Results for orders placed or performed during the hospital encounter of 08/09/15 (from the past 48 hour(s))  CBC with Differential/Platelet     Status: Abnormal   Collection Time: 08/09/15 10:51 PM  Result Value Ref Range   WBC 9.0 4.0 - 10.5 K/uL   RBC 3.91 (L) 4.22 - 5.81 MIL/uL   Hemoglobin 9.2 (L) 13.0 - 17.0 g/dL   HCT 29.3 (L) 39.0 - 52.0 %   MCV 74.9 (L) 78.0 - 100.0 fL   MCH 23.5 (L) 26.0 - 34.0 pg   MCHC 31.4 30.0 - 36.0 g/dL   RDW 19.5 (H) 11.5 - 15.5 %   Platelets 331 150 - 400 K/uL   Neutrophils Relative % 74 %   Lymphocytes Relative 13 %   Monocytes Relative 9 %   Eosinophils Relative 2 %   Basophils Relative 2 %   Neutro Abs 6.6 1.7 - 7.7 K/uL   Lymphs Abs 1.2 0.7 - 4.0 K/uL   Monocytes Absolute 0.8 0.1 - 1.0 K/uL   Eosinophils Absolute 0.2 0.0 - 0.7 K/uL   Basophils Absolute 0.2 (H) 0.0 - 0.1 K/uL   RBC Morphology POLYCHROMASIA PRESENT     Comment: ELLIPTOCYTES TEARDROP CELLS  WBC Morphology MILD LEFT SHIFT (1-5% METAS, OCC MYELO, OCC BANDS)   Basic metabolic panel     Status: Abnormal   Collection Time: 08/09/15 10:51 PM  Result Value Ref Range   Sodium 140 135 - 145 mmol/L   Potassium 4.6 3.5 - 5.1 mmol/L   Chloride 107 101 - 111 mmol/L   CO2 21 (L) 22 - 32 mmol/L   Glucose, Bld 109 (H) 65 - 99 mg/dL   BUN 29 (H) 6 - 20 mg/dL   Creatinine, Ser 1.81 (H) 0.61 - 1.24 mg/dL   Calcium 8.5 (L) 8.9 - 10.3 mg/dL   GFR calc non Af Amer 35 (L) >60 mL/min   GFR calc Af Amer 41 (L) >60 mL/min    Comment: (NOTE) The eGFR has been calculated using the CKD EPI equation. This calculation has not been validated in all clinical situations. eGFR's persistently <60 mL/min signify possible Chronic Kidney Disease.    Anion gap 12 5 - 15  Uric acid     Status: Abnormal   Collection  Time: 08/09/15 10:51 PM  Result Value Ref Range   Uric Acid, Serum 9.4 (H) 4.4 - 7.6 mg/dL  I-Stat CG4 Lactic Acid, ED     Status: None   Collection Time: 08/09/15 10:58 PM  Result Value Ref Range   Lactic Acid, Venous 1.36 0.5 - 2.0 mmol/L  I-Stat CG4 Lactic Acid, ED     Status: None   Collection Time: 08/10/15  2:14 AM  Result Value Ref Range   Lactic Acid, Venous 1.06 0.5 - 2.0 mmol/L  CK     Status: Abnormal   Collection Time: 08/10/15  8:20 AM  Result Value Ref Range   Total CK 21 (L) 49 - 397 U/L  Sedimentation rate     Status: Abnormal   Collection Time: 08/10/15  8:20 AM  Result Value Ref Range   Sed Rate 71 (H) 0 - 16 mm/hr  C-reactive protein     Status: Abnormal   Collection Time: 08/10/15  8:20 AM  Result Value Ref Range   CRP 19.5 (H) <1.0 mg/dL  Basic metabolic panel     Status: Abnormal   Collection Time: 08/10/15  8:20 AM  Result Value Ref Range   Sodium 139 135 - 145 mmol/L   Potassium 4.5 3.5 - 5.1 mmol/L   Chloride 107 101 - 111 mmol/L   CO2 21 (L) 22 - 32 mmol/L   Glucose, Bld 113 (H) 65 - 99 mg/dL   BUN 25 (H) 6 - 20 mg/dL   Creatinine, Ser 1.60 (H) 0.61 - 1.24 mg/dL   Calcium 8.2 (L) 8.9 - 10.3 mg/dL   GFR calc non Af Amer 41 (L) >60 mL/min   GFR calc Af Amer 48 (L) >60 mL/min    Comment: (NOTE) The eGFR has been calculated using the CKD EPI equation. This calculation has not been validated in all clinical situations. eGFR's persistently <60 mL/min signify possible Chronic Kidney Disease.    Anion gap 11 5 - 15  CBC     Status: Abnormal   Collection Time: 08/10/15  8:20 AM  Result Value Ref Range   WBC 8.1 4.0 - 10.5 K/uL   RBC 3.64 (L) 4.22 - 5.81 MIL/uL   Hemoglobin 8.7 (L) 13.0 - 17.0 g/dL   HCT 27.5 (L) 39.0 - 52.0 %   MCV 75.5 (L) 78.0 - 100.0 fL   MCH 23.9 (L) 26.0 - 34.0 pg   MCHC 31.6 30.0 -  36.0 g/dL   RDW 19.4 (H) 11.5 - 15.5 %   Platelets 278 150 - 400 K/uL    Dg Foot Complete Right  08/09/2015  CLINICAL DATA:  75 year old  male with foot pain EXAM: RIGHT FOOT COMPLETE - 3+ VIEW COMPARISON:  Radiograph dated 08/05/2015 FINDINGS: Stable postsurgical changes of the fourth and transmetatarsal amputation. There is degenerative changes of the tarsometatarsal joints. The bones are osteopenic. No acute fracture. The soft tissues are unremarkable. IMPRESSION: No acute fracture.  No interval change. Electronically Signed   By: Anner Crete M.D.   On: 08/09/2015 23:42    Review of Systems  All other systems reviewed and are negative.  Blood pressure 152/68, pulse 66, temperature 98 F (36.7 C), temperature source Oral, resp. rate 18, SpO2 99 %. Physical Exam On examination patient has ischemic changes in both lower extremities he does not have a strong palpable pulse bilaterally. There is no redness or cellulitis from the right great toenail removal no signs of paronychial infection or complications from the  nail removal. Patient has redness and swelling around the right ankle but no ascending cellulitis. There is pain with attempted range of motion of the ankle. Radiographs shows no destructive bony changes. There is no abscess or ulcers around the ankle. There is no venous stasis changes in either leg. Normal white blood cell count uric acid 9.4 Assessment/Plan: Assessment: Acute gout right ankle.  Plan: I will order colchicine 0.6 mg by mouth twice a day. I will follow-up in the office in 1 week.  Caleb Hays V 08/10/2015, 12:01 PM

## 2015-08-11 DIAGNOSIS — L03115 Cellulitis of right lower limb: Secondary | ICD-10-CM | POA: Diagnosis not present

## 2015-08-11 DIAGNOSIS — D638 Anemia in other chronic diseases classified elsewhere: Secondary | ICD-10-CM | POA: Diagnosis not present

## 2015-08-11 DIAGNOSIS — J42 Unspecified chronic bronchitis: Secondary | ICD-10-CM | POA: Diagnosis not present

## 2015-08-11 DIAGNOSIS — N179 Acute kidney failure, unspecified: Secondary | ICD-10-CM | POA: Diagnosis not present

## 2015-08-11 LAB — CBC WITH DIFFERENTIAL/PLATELET
BASOS PCT: 1 %
Basophils Absolute: 0.1 10*3/uL (ref 0.0–0.1)
EOS ABS: 0 10*3/uL (ref 0.0–0.7)
EOS PCT: 0 %
HCT: 26.1 % — ABNORMAL LOW (ref 39.0–52.0)
Hemoglobin: 8.3 g/dL — ABNORMAL LOW (ref 13.0–17.0)
LYMPHS PCT: 18 %
Lymphs Abs: 1.4 10*3/uL (ref 0.7–4.0)
MCH: 23.9 pg — ABNORMAL LOW (ref 26.0–34.0)
MCHC: 31.8 g/dL (ref 30.0–36.0)
MCV: 75 fL — AB (ref 78.0–100.0)
MONO ABS: 1.5 10*3/uL — AB (ref 0.1–1.0)
Monocytes Relative: 20 %
NEUTROS PCT: 61 %
Neutro Abs: 4.7 10*3/uL (ref 1.7–7.7)
PLATELETS: 287 10*3/uL (ref 150–400)
RBC: 3.48 MIL/uL — AB (ref 4.22–5.81)
RDW: 19 % — AB (ref 11.5–15.5)
WBC: 7.7 10*3/uL (ref 4.0–10.5)

## 2015-08-11 LAB — BASIC METABOLIC PANEL
ANION GAP: 6 (ref 5–15)
BUN: 19 mg/dL (ref 6–20)
CO2: 22 mmol/L (ref 22–32)
Calcium: 8.3 mg/dL — ABNORMAL LOW (ref 8.9–10.3)
Chloride: 109 mmol/L (ref 101–111)
Creatinine, Ser: 1.39 mg/dL — ABNORMAL HIGH (ref 0.61–1.24)
GFR, EST AFRICAN AMERICAN: 56 mL/min — AB (ref >60)
GFR, EST NON AFRICAN AMERICAN: 49 mL/min — AB (ref >60)
Glucose, Bld: 91 mg/dL (ref 65–99)
POTASSIUM: 4.4 mmol/L (ref 3.5–5.1)
Sodium: 137 mmol/L (ref 135–145)

## 2015-08-11 MED ORDER — HYDROCODONE-ACETAMINOPHEN 5-325 MG PO TABS: 1.0000 | ORAL_TABLET | ORAL | Status: DC | PRN

## 2015-08-11 MED ORDER — DOXYCYCLINE HYCLATE 100 MG PO TABS: 100.0000 mg | ORAL_TABLET | Freq: Two times a day (BID) | ORAL | Status: DC

## 2015-08-11 MED ORDER — SACCHAROMYCES BOULARDII 250 MG PO CAPS
250.0000 mg | ORAL_CAPSULE | Freq: Two times a day (BID) | ORAL | Status: DC
  Administered 2015-08-11: 250 mg via ORAL
  Filled 2015-08-11: qty 1

## 2015-08-11 MED ORDER — COLCHICINE 0.6 MG PO TABS: 0.6000 mg | ORAL_TABLET | Freq: Two times a day (BID) | ORAL | Status: DC

## 2015-08-11 MED ORDER — SACCHAROMYCES BOULARDII 250 MG PO CAPS: 250.0000 mg | ORAL_CAPSULE | Freq: Two times a day (BID) | ORAL | Status: DC

## 2015-08-11 MED ORDER — CEPHALEXIN 500 MG PO CAPS
500.0000 mg | ORAL_CAPSULE | Freq: Four times a day (QID) | ORAL | Status: DC
  Administered 2015-08-11: 500 mg via ORAL
  Filled 2015-08-11: qty 1

## 2015-08-11 MED ORDER — CEPHALEXIN 500 MG PO CAPS: 500.0000 mg | ORAL_CAPSULE | Freq: Four times a day (QID) | ORAL | Status: DC

## 2015-08-11 NOTE — Discharge Summary (Addendum)
[facility] Discharge Summary   Patient: Caleb Hays NWG:[id number]   PCP: Caleb Pillow, NP DOB: 01/08/1941   Date of admission: 08/09/2015   Date of discharge: 08/11/2015    Discharge Diagnoses:  Principal Problem:   Cellulitis of right foot Active Problems:   COPD (chronic obstructive pulmonary disease) ([place])   Acute kidney injury superimposed on chronic kidney disease (HCC)   Elevated blood uric acid level   Gout   Anemia of chronic disease   Recommendations for Outpatient Follow-up:  1. Follow-up with PCP in one week  2. Discharged with home health and PTOT 3. Can perform wet-to-dry dressing as needed  Follow-up Information    Follow up with Caleb V, MD In 1 week.   Specialty:  Orthopedic Surgery   Contact information:   [place] [address] [phone]       Follow up with [facility].   Why:  They will contact you to schedule home therapy visits.   Contact information:   [address] [place] [address] [phone]       Follow up with Caleb Pillow, NP. Schedule an appointment as soon as possible for a visit in 1 week.   Contact information:   [facility] [address] [place] [address] [phone]      Diet recommendation: Regular diet  Activity: The patient is advised to gradually reintroduce usual activities.  Discharge Condition: good  History of present illness: As per the H and P dictated on admission, "[name] is a 75 year old male with a past medical history significant for gout, limb ischemia s/p amputations, COPD, Gout; who presents with redness and swelling of his right foot going up into his ankle. Patient recently states he went to orthopedics office had his right first digit ingrown toenail removed in the orthopedic office by one of Dr. Jess Barters partners 3 days ago. Patient notes that since that time spent having progressively  worsening swelling in the last day and half and noticed redness creeping up from the foot to his ankle. Patient notes that his pain has a 5 out of 10 on the pain scale and comes and goes.   On admission to the emergency department patient was evaluated and seen have elevated uric acid level of 9.4. Orthopedic was consulted and will see patient in a.m."  Hospital Course:  Summary of his active problems in the hospital is as following. Principal Problem: Cellulitis of right foot Gout Recent ingrown toenail removal procedure The patient presents with complaints of redness and swelling of the right foot. Patient had elevated ESR CRP and uric acid levels. Orthopedic was consulted who recommends the patient does not appear to have any evidence of cellulitis. Patient did have persistent leukocytosis with left shift. Gram stain grow gram-positive cocci in pairs. Patient was started on colchicine by orthopedic which she will finish for 7 days. Patient also finished a seven-day course of antibiotic with doxycycline due to his elevated left shift with fever and redness and swelling. Patient will follow up with orthopedics in one week as an outpatient.  Acute kidney injury superimposed on chronic kidney disease stage 3 (HCC) Renal function was elevated from the baseline on arrival. Patient was recently on Bactrim which could cause the elevation in the renal function. Switch to doxycycline.  Anemia of chronic disease Stable and no evidence of bleeding. Continue monitoring as an outpatient. May Benefit from iron replacement as an outpatient  All other chronic medical condition were stable during the hospitalization.  Patient was seen by physical therapy, who recommended SNF, due to insurance it was recommended that the patient can go with home health. Patient did meet the criteria for going on home on reevaluation. Home Health was arranged by Education officer, museum and case Freight forwarder. On the day of the  discharge the patient's renal function remains stable, and no other acute medical condition were reported by patient. the patient was felt safe to be discharge at home with home health.  Procedures and Results:  none   Consultations:  Wound care, orthopedics  DISCHARGE MEDICATION: Current Discharge Medication List    START taking these medications   Details  colchicine 0.6 MG tablet Take 1 tablet (0.6 mg total) by mouth 2 (two) times daily. Qty: 14 tablet, Refills: 0    doxycycline (VIBRA-TABS) 100 MG tablet Take 1 tablet (100 mg total) by mouth every 12 (twelve) hours. Qty: 14 tablet, Refills: 0    HYDROcodone-acetaminophen (NORCO/VICODIN) 5-325 MG tablet Take 1 tablet by mouth every 4 (four) hours as needed for moderate pain. Qty: 30 tablet, Refills: 0    saccharomyces boulardii (FLORASTOR) 250 MG capsule Take 1 capsule (250 mg total) by mouth 2 (two) times daily. Qty: 14 capsule, Refills: 0      CONTINUE these medications which have NOT CHANGED   Details  albuterol (PROVENTIL HFA;VENTOLIN HFA) 108 (90 BASE) MCG/ACT inhaler Inhale 2 puffs into the lungs 2 (two) times daily. Take every daily per niece    traZODone (DESYREL) 50 MG tablet Take 0.5 tablets (25 mg total) by mouth at bedtime as needed for sleep. Qty: 30 tablet, Refills: 0      STOP taking these medications     sulfamethoxazole-trimethoprim (BACTRIM DS,SEPTRA DS) 800-160 MG tablet        No Known Allergies Discharge Instructions    Apply dressing    Complete by:  As directed      Diet - low sodium heart healthy    Complete by:  As directed      Diet general    Complete by:  As directed      Discharge instructions    Complete by:  As directed   It is important that you read following instructions as well as go over your medication list with RN to help you understand your care after this hospitalization.  Discharge Instructions: Follow up with PCP in 1-2 weeks.   Please request your primary care  physician to go over all Hospital Tests and Procedure/Radiological results at the follow up,  Please get all Hospital records sent to your PCP by signing hospital release before you go home.   Do not drive, operating heavy machinery, perform activities at heights, swimming or participation in water activities or provide baby sitting services while your are on Pain, Sleep and Anxiety Medications; until you have been seen by Primary Care Physician or a Neurologist and advised to do so again. Do not take more than prescribed Pain, Sleep and Anxiety Medications. You were cared for by a hospitalist during your hospital stay. If you have any questions about your discharge medications or the care you received while you were in the hospital after you are discharged, you can call the unit and ask to speak with the hospitalist on call if the hospitalist that took care of you is not available.  Once you are discharged, your primary care physician will handle any further medical issues. Please note that  NO REFILLS for any discharge medications will be authorized once you are discharged, as it is imperative that you return to your primary care physician (or establish a relationship with a primary care physician if you do not have one) for your aftercare needs so that they can reassess your need for medications and monitor your lab values. You Must read complete instructions/literature along with all the possible adverse reactions/side effects for all the Medicines you take and that have been prescribed to you. Take any new Medicines after you have completely understood and accept all the possible adverse reactions/side effects. Wear Seat belts while driving.     Increase activity slowly    Complete by:  As directed      Increase activity slowly    Complete by:  As directed           Discharge Exam: There were no vitals filed for this visit. Filed Vitals:   08/10/15 2054 08/11/15 0511  BP: 148/68 123/58    Pulse: 88 96  Temp: 98.9 F (37.2 C) 98.4 F (36.9 C)  Resp:  18   General: Appear in mild distress, redness of right ankle; Oral Mucosa moist. Cardiovascular: S1 and S2 Present, no Murmur, no JVD Respiratory: Bilateral Air entry present and Clear to Auscultation, no Crackles, no wheezes Abdomen: Bowel Sound present, Soft and no tenderness Extremities: Right ankle edema, no calf tenderness Neurology: Grossly no focal neuro deficit.  The results of significant diagnostics from this hospitalization (including imaging, microbiology, ancillary and laboratory) are listed below for reference.    Significant Diagnostic Studies: Dg Foot Complete Right  August 15, 2015  CLINICAL DATA:  75 year old male with foot pain EXAM: RIGHT FOOT COMPLETE - 3+ VIEW COMPARISON:  Radiograph dated 08/05/2015 FINDINGS: Stable postsurgical changes of the fourth and transmetatarsal amputation. There is degenerative changes of the tarsometatarsal joints. The bones are osteopenic. No acute fracture. The soft tissues are unremarkable. IMPRESSION: No acute fracture.  No interval change. Electronically Signed   By: Anner Crete M.D.   On: 15-Aug-2015 23:42   Dg Foot Complete Right  08/05/2015  CLINICAL DATA:  Wound on the great toe.  Redness, tenderness. EXAM: RIGHT FOOT COMPLETE - 3+ VIEW COMPARISON:  None. 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 Degenerative changes at the first MTP joint and first metatarsal joints. No acute bony abnormality. Specifically, no fracture, subluxation, or dislocation. Soft tissues are intact. No radiographic changes of osteomyelitis. IMPRESSION: No acute bony abnormality. Electronically Signed   By: Rolm Baptise M.D.   On: 08/05/2015 21:17    Microbiology: Recent Results (from the past 240 hour(s))  Wound culture     Status: None (Preliminary result)   Collection Time: 08/10/15  3:57 AM  Result Value Ref Range Status   Specimen Description WOUND RIGHT TOE   Final   Special Requests NONE  Final   Gram Stain   Final    NO WBC SEEN NO SQUAMOUS EPITHELIAL CELLS SEEN RARE GRAM POSITIVE COCCI IN PAIRS Performed at [facility]    Culture   Final    Culture reincubated for better growth Performed at [facility]    Report Status PENDING  Incomplete     Labs: CBC:  Recent Labs Lab 08/05/15 1505 08/15/2015 2251 08/10/15 0820 08/11/15 0554  WBC 5.3 9.0 8.1 7.7  NEUTROABS 2.8 6.6  --  4.7  HGB 11.5* 9.2* 8.7* 8.3*  HCT 35.6* 29.3* 27.5* 26.1*  MCV 75.6* 74.9* 75.5* 75.0*  PLT 268 331 278 092   Basic Metabolic Panel:  Recent Labs Lab 08/05/15 1505 08/09/15 2251 08/10/15 0820 08/11/15 0554  NA 139 140 139 137  K 4.9 4.6 4.5 4.4  CL 104 107 107 109  CO2 24 21* 21* 22  GLUCOSE 103* 109* 113* 91  BUN 20 29* 25* 19  CREATININE 1.33* 1.81* 1.60* 1.39*  CALCIUM 8.6* 8.5* 8.2* 8.3*   Liver Function Tests:  Recent Labs Lab 08/05/15 1505  AST 23  ALT 11*  ALKPHOS 67  BILITOT 0.4  PROT 6.2*  ALBUMIN 3.2*   Cardiac Enzymes:  Recent Labs Lab 08/10/15 0820  CKTOTAL 21*  Time spent: 30 minutes  Signed:  Livi Mcgann  [facility] 08/11/2015, 4:36 PM

## 2015-08-11 NOTE — Clinical Social Work Note (Signed)
CSW spoke with patient's niece, caregiver and explained the insurance authorization process and unfortunately the patient's dx (great toenail removal) is not a covered dx for [facility] for SNF.  Patient is already set up with home health through [facility] and RNCM will attempt to place SW in the home to assist with placement if the niece is wishint to pursue placement.  Vickii [name], LCSW [phone]  5N1-9; 2S 15-16 and Hospital Psychiatric Service Line Licensed Clinical Social Worker

## 2015-08-11 NOTE — Care Management Note (Signed)
Case Management Note  Patient Details  Name: ALPHONSO GREGSON MRN: [id number] Date of Birth: [birth year]  Subjective/Objective:          Admitted with cellulitis of right foot          Action/Plan: Referral made to CSW for SNF, per CSW SNF not approved by insurance company. Informed patient and spoke with patient's niece Daun Peacock by phone. She is agreeable with patient returning to her home with her and her husband as caregivers with resuming [facility] for HHPT, HHOT and Child psychotherapist. Ms Danielle Dess stated that patient has a rolling walker and 3N1 and would not benefit from a wheelchair due to limited space in the home.Ms Danielle Dess voiced concern about 4 steps into home, PT worked with patient on steps this pm and per therapist would be able to do 4 steps.Ms Danielle Dess will be able to transport patient home this pm. Contacted [facility] at [facility] and informed her that patient will d/c home today resuming HHPT, HHOT and Child psychotherapist. Updated patient, he is agreeable to d/c plan.        Expected Discharge Date:                  Expected Discharge Plan:  Home w Home Health Services  In-House Referral:  Clinical Social Work  Discharge planning Services  CM Consult  Post Acute Care Choice:  Home Health, Resumption of Svcs/PTA Provider Choice offered to:  Patient  DME Arranged:  N/A DME Agency:  NA  HH Arranged:  PT, OT, Social Work [employer] Agency:  [facility]  Status of Service:  Completed, signed off  Medicare Important Message Given:    Date Medicare IM Given:    Medicare IM give by:    Date Additional Medicare IM Given:    Additional Medicare Important Message give by:     If discussed at Long Length of Stay Meetings, dates discussed:    Additional Comments:  Monica Becton, RN 08/11/2015, 4:09 PM

## 2015-08-11 NOTE — Progress Notes (Signed)
PT Cancellation Note  Patient Details Name: Caleb Hays MRN: [id number] DOB: 1940-08-20   Cancelled Treatment:    Reason Eval/Treat Not Completed: Other (comment)   Politely declining PT, states he is not feeling well;   Will follow up later today as time allows;  Otherwise, will follow up for PT tomorrow;   Thank you,  Van Clines, PT  Acute Rehabilitation Services Pager [phone] Office [phone]     Van Clines [facility] 08/11/2015, 11:17 AM

## 2015-08-11 NOTE — Progress Notes (Signed)
Physical Therapy Treatment and Discharge Patient Details Name: Caleb Hays MRN: [id number] DOB: Jan 27, 1941 Today's Date: 08/11/2015    History of Present Illness Caleb Hays is a 75 year old male with a past medical history significant for gout, limb ischemia s/p amputations, COPD, Gout; who presents with redness and swelling of his right foot going up into his ankle.     PT Comments    Dc plan must be updated to go home as SNF is not covered by insurance; Able to walk in-room sitance with weight on heel with RW; Stair training done -- noted some weakness, but he was able to complete the steps with min assist and Caleb Hays tells me his niece's husband will be able to help him up the steps;   Plan is for dc home today -- confirmed with Case Mgr;  Will dc acute PT and all other PT needs can be addressed by HHPT.   Follow Up Recommendations  Home health PT (HHOT, SW)     Equipment Recommendations  Rolling walker with 5" wheels;3in1 (PT) (pt and neice declining wheelchair)    Recommendations for Other Services       Precautions / Restrictions Precautions Precautions: Fall Restrictions Other Position/Activity Restrictions: Very painful R foot; able to tolerate weight bearing on R foot    Mobility  Bed Mobility Overal bed mobility: Needs Assistance Bed Mobility: Supine to Sit     Supine to sit: Supervision     General bed mobility comments: smooth transition to sit, much improved; used rails  Transfers Overall transfer level: Needs assistance Equipment used: Rolling walker (2 wheeled) Transfers: Sit to/from Stand Sit to Stand: Mod assist         General transfer comment: light mod assist to power up; cues for ahnd placement and safety; decr control with descent to sit into recliner  Ambulation/Gait Ambulation/Gait assistance: Min guard Ambulation Distance (Feet): 15 Feet Assistive device: Rolling walker (2 wheeled) Gait Pattern/deviations: Step-to pattern      General Gait Details: Walks with RLE externally rotated and weight only on R heel; cues for RW proximity   Stairs Stairs: Yes Stairs assistance: Min assist Stair Management: Two rails;Step to pattern;Forwards Number of Stairs: 5 General stair comments: Cues for sequence; heavy dependence on rails for support, and at times it was difficult for Caleb Hays to step up onto a fully extend knees in stance, indicative of weakness  Wheelchair Mobility    Modified Rankin (Stroke Patients Only)       Balance     Sitting balance-Leahy Scale: Fair       Standing balance-Leahy Scale: Poor                      Cognition Arousal/Alertness: Awake/alert Behavior During Therapy: WFL for tasks assessed/performed Overall Cognitive Status: Within Functional Limits for tasks assessed                      Exercises      General Comments        Pertinent Vitals/Pain Pain Assessment: 0-10 Pain Score: 7  Pain Location: R foot with weight bearing Pain Descriptors / Indicators: Aching Pain Intervention(s): Limited activity within patient's tolerance;Monitored during session;Other (comment) (instructed pt to put weight through heel)    Home Living                      Prior Function  PT Goals (current goals can now be found in the care plan section) Acute Rehab PT Goals Patient Stated Goal: to get better PT Goal Formulation: All assessment and education complete, DC therapy Progress towards PT goals: Progressing toward goals (grossly adequate for dc)    Frequency  Min 3X/week    PT Plan Discharge plan needs to be updated    Co-evaluation             End of Session Equipment Utilized During Treatment: Gait belt Activity Tolerance: Patient tolerated treatment well;Patient limited by fatigue Patient left: in chair;with call bell/phone within reach     Time: 1513-1534 PT Time Calculation (min) (ACUTE ONLY): 21 min  Charges:   $Gait Training: 8-22 mins                    G Codes:  Functional Assessment Tool Used: Clinical judgement Functional Limitation: Mobility: Walking and moving around Mobility: Walking and Moving Around Goal Status [phone]): At least 1 percent but less than 20 percent impaired, limited or restricted Mobility: Walking and Moving Around Discharge Status [phone]): At least 20 percent but less than 40 percent impaired, limited or restricted   Van Clines [facility] 08/11/2015, 4:37 PM  Van Clines, PT  Acute Rehabilitation Services Pager [phone] Office [phone]

## 2015-08-11 NOTE — Progress Notes (Signed)
Pt ready for d/c home per MD. Pt met goals with PT for home since pt did not qualify for SNF. Already has 3n1 and walker at home, and declines wheelchair d/t limited room in house. Discharge instructions and prescriptions were reviewed with pt and daughter, denied any questions.   Raquel James  08/11/2015

## 2015-08-11 NOTE — Consult Note (Signed)
WOC consult requested for right foot prior to ortho service involvement.  Dr Lajoyce Corners now following for assessment and plan of care.  Please contact ortho team for further questions. Please re-consult if further assistance is needed.  Thank-you,  Cammie Mcgee MSN, RN, CWOCN, [place], CNS [phone]

## 2015-08-11 NOTE — Care Management Obs Status (Signed)
MEDICARE OBSERVATION STATUS NOTIFICATION   Patient Details  Name: Caleb Hays MRN: [id number] Date of Birth: [birth year]   Medicare Observation Status Notification Given:  Yes    Monica Becton, RN 08/11/2015, 2:19 PM

## 2015-08-13 ENCOUNTER — Inpatient Hospital Stay (HOSPITAL_COMMUNITY): Payer: Medicare HMO | Admitting: Anesthesiology

## 2015-08-13 ENCOUNTER — Inpatient Hospital Stay (HOSPITAL_COMMUNITY)
Admission: EM | Admit: 2015-08-13 | Discharge: 2015-08-16 | DRG: 493 | Disposition: A | Discharge status: Home Health | Payer: Medicare HMO | Attending: Family Medicine | Admitting: Family Medicine

## 2015-08-13 ENCOUNTER — Inpatient Hospital Stay (HOSPITAL_COMMUNITY): Admit: 2015-08-13 | Payer: Medicare HMO

## 2015-08-13 ENCOUNTER — Encounter (HOSPITAL_COMMUNITY): Payer: Self-pay

## 2015-08-13 ENCOUNTER — Encounter (HOSPITAL_COMMUNITY)
Admission: EM | Disposition: A | Discharge status: Home Health | Payer: Self-pay | Source: Home / Self Care | Attending: Family Medicine

## 2015-08-13 ENCOUNTER — Other Ambulatory Visit (HOSPITAL_COMMUNITY): Payer: Self-pay | Admitting: Orthopedic Surgery

## 2015-08-13 DIAGNOSIS — M19041 Primary osteoarthritis, right hand: Secondary | ICD-10-CM | POA: Diagnosis present

## 2015-08-13 DIAGNOSIS — H6122 Impacted cerumen, left ear: Secondary | ICD-10-CM | POA: Diagnosis present

## 2015-08-13 DIAGNOSIS — J449 Chronic obstructive pulmonary disease, unspecified: Secondary | ICD-10-CM | POA: Diagnosis present

## 2015-08-13 DIAGNOSIS — M25579 Pain in unspecified ankle and joints of unspecified foot: Secondary | ICD-10-CM | POA: Diagnosis present

## 2015-08-13 DIAGNOSIS — B9561 Methicillin susceptible Staphylococcus aureus infection as the cause of diseases classified elsewhere: Secondary | ICD-10-CM | POA: Diagnosis present

## 2015-08-13 DIAGNOSIS — M1A0711 Idiopathic chronic gout, right ankle and foot, with tophus (tophi): Secondary | ICD-10-CM | POA: Diagnosis present

## 2015-08-13 DIAGNOSIS — D638 Anemia in other chronic diseases classified elsewhere: Secondary | ICD-10-CM | POA: Diagnosis present

## 2015-08-13 DIAGNOSIS — M00071 Staphylococcal arthritis, right ankle and foot: Principal | ICD-10-CM | POA: Diagnosis present

## 2015-08-13 DIAGNOSIS — M25571 Pain in right ankle and joints of right foot: Secondary | ICD-10-CM | POA: Diagnosis present

## 2015-08-13 DIAGNOSIS — Z89421 Acquired absence of other right toe(s): Secondary | ICD-10-CM | POA: Diagnosis not present

## 2015-08-13 DIAGNOSIS — I739 Peripheral vascular disease, unspecified: Secondary | ICD-10-CM | POA: Diagnosis not present

## 2015-08-13 DIAGNOSIS — M19042 Primary osteoarthritis, left hand: Secondary | ICD-10-CM | POA: Diagnosis present

## 2015-08-13 DIAGNOSIS — L0390 Cellulitis, unspecified: Secondary | ICD-10-CM | POA: Diagnosis present

## 2015-08-13 DIAGNOSIS — Z87891 Personal history of nicotine dependence: Secondary | ICD-10-CM | POA: Diagnosis not present

## 2015-08-13 DIAGNOSIS — F329 Major depressive disorder, single episode, unspecified: Secondary | ICD-10-CM | POA: Diagnosis present

## 2015-08-13 DIAGNOSIS — M79604 Pain in right leg: Secondary | ICD-10-CM

## 2015-08-13 DIAGNOSIS — L97521 Non-pressure chronic ulcer of other part of left foot limited to breakdown of skin: Secondary | ICD-10-CM | POA: Diagnosis present

## 2015-08-13 DIAGNOSIS — N183 Chronic kidney disease, stage 3 (moderate): Secondary | ICD-10-CM | POA: Diagnosis present

## 2015-08-13 DIAGNOSIS — R627 Adult failure to thrive: Secondary | ICD-10-CM | POA: Insufficient documentation

## 2015-08-13 DIAGNOSIS — Z9889 Other specified postprocedural states: Secondary | ICD-10-CM | POA: Diagnosis not present

## 2015-08-13 DIAGNOSIS — L03115 Cellulitis of right lower limb: Secondary | ICD-10-CM | POA: Diagnosis present

## 2015-08-13 DIAGNOSIS — N179 Acute kidney failure, unspecified: Secondary | ICD-10-CM | POA: Diagnosis present

## 2015-08-13 DIAGNOSIS — M7989 Other specified soft tissue disorders: Secondary | ICD-10-CM

## 2015-08-13 DIAGNOSIS — J42 Unspecified chronic bronchitis: Secondary | ICD-10-CM | POA: Diagnosis not present

## 2015-08-13 HISTORY — DX: Other malaise: R53.81

## 2015-08-13 HISTORY — PX: ANKLE ARTHROSCOPY: SHX545

## 2015-08-13 LAB — CBC WITH DIFFERENTIAL/PLATELET
BASOS PCT: 0 %
Band Neutrophils: 0 %
Basophils Absolute: 0 10*3/uL (ref 0.0–0.1)
Blasts: 0 %
Eosinophils Absolute: 0 10*3/uL (ref 0.0–0.7)
Eosinophils Relative: 0 %
HCT: 31.4 % — ABNORMAL LOW (ref 39.0–52.0)
HEMOGLOBIN: 9.9 g/dL — AB (ref 13.0–17.0)
LYMPHS PCT: 17 %
Lymphs Abs: 2.1 10*3/uL (ref 0.7–4.0)
MCH: 23.6 pg — AB (ref 26.0–34.0)
MCHC: 31.5 g/dL (ref 30.0–36.0)
MCV: 74.9 fL — AB (ref 78.0–100.0)
MONO ABS: 1.1 10*3/uL — AB (ref 0.1–1.0)
MONOS PCT: 9 %
Metamyelocytes Relative: 0 %
Myelocytes: 0 %
NEUTROS ABS: 9.1 10*3/uL — AB (ref 1.7–7.7)
NEUTROS PCT: 74 %
NRBC: 0 /100{WBCs}
OTHER: 0 %
PLATELETS: 304 10*3/uL (ref 150–400)
PROMYELOCYTES ABS: 0 %
RBC: 4.19 MIL/uL — ABNORMAL LOW (ref 4.22–5.81)
RDW: 18.9 % — ABNORMAL HIGH (ref 11.5–15.5)
WBC: 12.3 10*3/uL — ABNORMAL HIGH (ref 4.0–10.5)

## 2015-08-13 LAB — COMPREHENSIVE METABOLIC PANEL
ALK PHOS: 77 U/L (ref 38–126)
ALT: 12 U/L — AB (ref 17–63)
AST: 14 U/L — ABNORMAL LOW (ref 15–41)
Albumin: 2.4 g/dL — ABNORMAL LOW (ref 3.5–5.0)
Anion gap: 6 (ref 5–15)
BUN: 11 mg/dL (ref 6–20)
CALCIUM: 8.5 mg/dL — AB (ref 8.9–10.3)
CO2: 23 mmol/L (ref 22–32)
CREATININE: 1.25 mg/dL — AB (ref 0.61–1.24)
Chloride: 109 mmol/L (ref 101–111)
GFR, EST NON AFRICAN AMERICAN: 55 mL/min — AB (ref >60)
Glucose, Bld: 133 mg/dL — ABNORMAL HIGH (ref 65–99)
Potassium: 4.2 mmol/L (ref 3.5–5.1)
Sodium: 138 mmol/L (ref 135–145)
TOTAL PROTEIN: 5.7 g/dL — AB (ref 6.5–8.1)
Total Bilirubin: 0.5 mg/dL (ref 0.3–1.2)

## 2015-08-13 LAB — WOUND CULTURE: Gram Stain: NONE SEEN

## 2015-08-13 LAB — C-REACTIVE PROTEIN: CRP: 9.6 mg/dL — AB (ref <1.0)

## 2015-08-13 LAB — PREALBUMIN: Prealbumin: 9.2 mg/dL — ABNORMAL LOW (ref 18–38)

## 2015-08-13 LAB — URIC ACID: Uric Acid, Serum: 7.3 mg/dL (ref 4.4–7.6)

## 2015-08-13 LAB — PROTIME-INR
INR: 1.27 (ref 0.00–1.49)
Prothrombin Time: 16 seconds — ABNORMAL HIGH (ref 11.6–15.2)

## 2015-08-13 LAB — SEDIMENTATION RATE: SED RATE: 61 mm/h — AB (ref 0–16)

## 2015-08-13 SURGERY — ARTHROSCOPY, ANKLE
Anesthesia: General | Site: Ankle | Laterality: Right

## 2015-08-13 MED ORDER — MEPERIDINE HCL 25 MG/ML IJ SOLN: 6.2500 mg | INTRAMUSCULAR | Status: DC | PRN

## 2015-08-13 MED ORDER — ONDANSETRON HCL 4 MG PO TABS
4.0000 mg | ORAL_TABLET | Freq: Four times a day (QID) | ORAL | Status: DC | PRN
Start: 2015-08-13 — End: 2015-08-16
  Administered 2015-08-15: 4 mg via ORAL
  Filled 2015-08-13: qty 1

## 2015-08-13 MED ORDER — DOCUSATE SODIUM 100 MG PO CAPS
100.0000 mg | ORAL_CAPSULE | Freq: Two times a day (BID) | ORAL | Status: DC
  Administered 2015-08-13 – 2015-08-16 (×6): 100 mg via ORAL
  Filled 2015-08-13 (×6): qty 1

## 2015-08-13 MED ORDER — COLCHICINE 0.6 MG PO TABS
0.6000 mg | ORAL_TABLET | Freq: Two times a day (BID) | ORAL | Status: DC
  Administered 2015-08-13 – 2015-08-16 (×6): 0.6 mg via ORAL
  Filled 2015-08-13 (×6): qty 1

## 2015-08-13 MED ORDER — ACETAMINOPHEN 325 MG PO TABS: 650.0000 mg | ORAL_TABLET | Freq: Four times a day (QID) | ORAL | Status: DC | PRN

## 2015-08-13 MED ORDER — ACETAMINOPHEN 325 MG PO TABS
650.0000 mg | ORAL_TABLET | Freq: Four times a day (QID) | ORAL | Status: DC | PRN
  Administered 2015-08-14 (×2): 650 mg via ORAL
  Filled 2015-08-13 (×2): qty 2

## 2015-08-13 MED ORDER — VANCOMYCIN HCL IN DEXTROSE 1-5 GM/200ML-% IV SOLN: 1000.0000 mg | Freq: Once | INTRAVENOUS | Status: DC

## 2015-08-13 MED ORDER — HYDROMORPHONE HCL 1 MG/ML IJ SOLN
0.2500 mg | INTRAMUSCULAR | Status: DC | PRN
Start: 2015-08-13 — End: 2015-08-13
  Administered 2015-08-13 (×2): 0.5 mg via INTRAVENOUS

## 2015-08-13 MED ORDER — ONDANSETRON HCL 4 MG/2ML IJ SOLN: 4.0000 mg | Freq: Four times a day (QID) | INTRAMUSCULAR | Status: DC | PRN

## 2015-08-13 MED ORDER — ACETAMINOPHEN 650 MG RE SUPP: 650.0000 mg | Freq: Four times a day (QID) | RECTAL | Status: DC | PRN

## 2015-08-13 MED ORDER — PROPOFOL 10 MG/ML IV BOLUS
INTRAVENOUS | Status: DC | PRN
  Administered 2015-08-13: 150 mg via INTRAVENOUS

## 2015-08-13 MED ORDER — ALUM & MAG HYDROXIDE-SIMETH 200-200-20 MG/5ML PO SUSP
30.0000 mL | Freq: Four times a day (QID) | ORAL | Status: DC | PRN
  Administered 2015-08-15: 30 mL via ORAL
  Filled 2015-08-13: qty 30

## 2015-08-13 MED ORDER — LIDOCAINE HCL (CARDIAC) 20 MG/ML IV SOLN
INTRAVENOUS | Status: AC
  Filled 2015-08-13: qty 10

## 2015-08-13 MED ORDER — SODIUM CHLORIDE 0.9 % IV SOLN
1500.0000 mg | Freq: Once | INTRAVENOUS | Status: AC
  Administered 2015-08-13: 1500 mg via INTRAVENOUS
  Filled 2015-08-13: qty 1500

## 2015-08-13 MED ORDER — STERILE WATER FOR IRRIGATION IR SOLN
Status: DC | PRN
  Administered 2015-08-13: 200 mL

## 2015-08-13 MED ORDER — HYDROMORPHONE HCL 1 MG/ML IJ SOLN
INTRAMUSCULAR | Status: AC
  Filled 2015-08-13: qty 1

## 2015-08-13 MED ORDER — BUPIVACAINE HCL (PF) 0.25 % IJ SOLN
INTRAMUSCULAR | Status: AC
  Filled 2015-08-13: qty 10

## 2015-08-13 MED ORDER — OXYCODONE HCL 5 MG PO TABS
5.0000 mg | ORAL_TABLET | Freq: Once | ORAL | Status: DC | PRN
Start: 2015-08-13 — End: 2015-08-13

## 2015-08-13 MED ORDER — ALBUTEROL SULFATE HFA 108 (90 BASE) MCG/ACT IN AERS: 2.0000 | INHALATION_SPRAY | Freq: Two times a day (BID) | RESPIRATORY_TRACT | Status: DC

## 2015-08-13 MED ORDER — PIPERACILLIN-TAZOBACTAM 3.375 G IVPB 30 MIN
3.3750 g | Freq: Once | INTRAVENOUS | Status: AC
  Administered 2015-08-13: 3.375 g via INTRAVENOUS
  Filled 2015-08-13: qty 50

## 2015-08-13 MED ORDER — METOCLOPRAMIDE HCL 5 MG/ML IJ SOLN: 5.0000 mg | Freq: Three times a day (TID) | INTRAMUSCULAR | Status: DC | PRN

## 2015-08-13 MED ORDER — VANCOMYCIN HCL 10 G IV SOLR
1250.0000 mg | INTRAVENOUS | Status: DC
  Filled 2015-08-13: qty 1250

## 2015-08-13 MED ORDER — METOCLOPRAMIDE HCL 5 MG PO TABS: 5.0000 mg | ORAL_TABLET | Freq: Three times a day (TID) | ORAL | Status: DC | PRN

## 2015-08-13 MED ORDER — MIDAZOLAM HCL 2 MG/2ML IJ SOLN
INTRAMUSCULAR | Status: AC
  Filled 2015-08-13: qty 2

## 2015-08-13 MED ORDER — OXYCODONE HCL 5 MG/5ML PO SOLN
5.0000 mg | Freq: Once | ORAL | Status: DC | PRN
Start: 2015-08-13 — End: 2015-08-13

## 2015-08-13 MED ORDER — OXYCODONE-ACETAMINOPHEN 5-325 MG PO TABS
1.0000 | ORAL_TABLET | Freq: Once | ORAL | Status: AC
  Administered 2015-08-13: 1 via ORAL
  Filled 2015-08-13: qty 1

## 2015-08-13 MED ORDER — SODIUM CHLORIDE 0.9 % IR SOLN
Status: DC | PRN
  Administered 2015-08-13: 3000 mL

## 2015-08-13 MED ORDER — VANCOMYCIN HCL 10 G IV SOLR
1250.0000 mg | INTRAVENOUS | Status: DC
  Administered 2015-08-14: 1250 mg via INTRAVENOUS
  Filled 2015-08-13 (×2): qty 1250

## 2015-08-13 MED ORDER — PIPERACILLIN-TAZOBACTAM 3.375 G IVPB
3.3750 g | Freq: Three times a day (TID) | INTRAVENOUS | Status: DC
  Administered 2015-08-13 – 2015-08-16 (×8): 3.375 g via INTRAVENOUS
  Filled 2015-08-13 (×12): qty 50

## 2015-08-13 MED ORDER — ALBUTEROL SULFATE (2.5 MG/3ML) 0.083% IN NEBU
3.0000 mL | INHALATION_SOLUTION | Freq: Two times a day (BID) | RESPIRATORY_TRACT | Status: DC
  Administered 2015-08-13 – 2015-08-16 (×6): 3 mL via RESPIRATORY_TRACT
  Filled 2015-08-13 (×7): qty 3

## 2015-08-13 MED ORDER — SENNA 8.6 MG PO TABS
1.0000 | ORAL_TABLET | Freq: Two times a day (BID) | ORAL | Status: DC
  Administered 2015-08-14 – 2015-08-16 (×5): 8.6 mg via ORAL
  Filled 2015-08-13 (×6): qty 1

## 2015-08-13 MED ORDER — METHOCARBAMOL 500 MG PO TABS
500.0000 mg | ORAL_TABLET | Freq: Four times a day (QID) | ORAL | Status: DC | PRN
  Administered 2015-08-13 – 2015-08-16 (×9): 500 mg via ORAL
  Filled 2015-08-13 (×9): qty 1

## 2015-08-13 MED ORDER — SODIUM CHLORIDE 0.9 % IV SOLN
INTRAVENOUS | Status: AC
  Administered 2015-08-13: 16:00:00 via INTRAVENOUS

## 2015-08-13 MED ORDER — HYDROCODONE-ACETAMINOPHEN 5-325 MG PO TABS
1.0000 | ORAL_TABLET | Freq: Four times a day (QID) | ORAL | Status: DC | PRN
  Administered 2015-08-13 – 2015-08-14 (×4): 1 via ORAL
  Filled 2015-08-13 (×4): qty 1

## 2015-08-13 MED ORDER — METHOCARBAMOL 1000 MG/10ML IJ SOLN: 500.0000 mg | Freq: Four times a day (QID) | INTRAVENOUS | Status: DC | PRN

## 2015-08-13 MED ORDER — HYDROMORPHONE HCL 1 MG/ML IJ SOLN
0.5000 mg | INTRAMUSCULAR | Status: DC | PRN
  Administered 2015-08-14 – 2015-08-16 (×18): 0.5 mg via INTRAVENOUS
  Filled 2015-08-13 (×18): qty 1

## 2015-08-13 MED ORDER — TRAZODONE HCL 50 MG PO TABS
25.0000 mg | ORAL_TABLET | Freq: Every evening | ORAL | Status: DC | PRN
  Administered 2015-08-13 – 2015-08-15 (×3): 25 mg via ORAL
  Filled 2015-08-13 (×3): qty 1

## 2015-08-13 MED ORDER — FENTANYL CITRATE (PF) 100 MCG/2ML IJ SOLN
INTRAMUSCULAR | Status: DC | PRN
  Administered 2015-08-13: 100 ug via INTRAVENOUS

## 2015-08-13 MED ORDER — BUPIVACAINE HCL (PF) 0.25 % IJ SOLN
INTRAMUSCULAR | Status: DC | PRN
  Administered 2015-08-13: 10 mL

## 2015-08-13 MED ORDER — SODIUM CHLORIDE 0.9 % IV SOLN
INTRAVENOUS | Status: DC
  Administered 2015-08-13: 22:00:00 via INTRAVENOUS

## 2015-08-13 MED ORDER — SENNOSIDES-DOCUSATE SODIUM 8.6-50 MG PO TABS: 1.0000 | ORAL_TABLET | Freq: Every evening | ORAL | Status: DC | PRN

## 2015-08-13 MED ORDER — FENTANYL CITRATE (PF) 250 MCG/5ML IJ SOLN
INTRAMUSCULAR | Status: AC
  Filled 2015-08-13: qty 5

## 2015-08-13 MED ORDER — LIDOCAINE HCL (CARDIAC) 20 MG/ML IV SOLN
INTRAVENOUS | Status: DC | PRN
  Administered 2015-08-13: 80 mg via INTRAVENOUS

## 2015-08-13 MED ORDER — ONDANSETRON HCL 4 MG PO TABS: 4.0000 mg | ORAL_TABLET | Freq: Four times a day (QID) | ORAL | Status: DC | PRN

## 2015-08-13 MED ORDER — PROPOFOL 10 MG/ML IV BOLUS
INTRAVENOUS | Status: AC
  Filled 2015-08-13: qty 20

## 2015-08-13 SURGICAL SUPPLY — 42 items
BLADE CUDA 5.5 (BLADE) IMPLANT
BLADE GREAT WHITE 4.2 (BLADE) ×2 IMPLANT
BLADE GREAT WHITE 4.2MM (BLADE) ×1
BLADE INCISOR PLUS 5.5 (BLADE) IMPLANT
BNDG COHESIVE 6X5 TAN STRL LF (GAUZE/BANDAGES/DRESSINGS) ×3 IMPLANT
BUR OVAL 4.0 (BURR) IMPLANT
CONT SPEC 4OZ CLIKSEAL STRL BL (MISCELLANEOUS) ×3 IMPLANT
COVER SURGICAL LIGHT HANDLE (MISCELLANEOUS) ×6 IMPLANT
CUFF TOURNIQUET SINGLE 34IN LL (TOURNIQUET CUFF) IMPLANT
CUFF TOURNIQUET SINGLE 44IN (TOURNIQUET CUFF) IMPLANT
DRAPE ARTHROSCOPY W/POUCH 114 (DRAPES) ×3 IMPLANT
DRAPE C-ARM MINI 42X72 WSTRAPS (DRAPES) IMPLANT
DRAPE U-SHAPE 47X51 STRL (DRAPES) ×3 IMPLANT
DRSG EMULSION OIL 3X3 NADH (GAUZE/BANDAGES/DRESSINGS) ×3 IMPLANT
DRSG PAD ABDOMINAL 8X10 ST (GAUZE/BANDAGES/DRESSINGS) ×3 IMPLANT
DURAPREP 26ML APPLICATOR (WOUND CARE) ×3 IMPLANT
GAUZE SPONGE 4X4 12PLY STRL (GAUZE/BANDAGES/DRESSINGS) ×3 IMPLANT
GLOVE BIOGEL PI IND STRL 9 (GLOVE) ×1 IMPLANT
GLOVE BIOGEL PI INDICATOR 9 (GLOVE) ×2
GLOVE SURG ORTHO 9.0 STRL STRW (GLOVE) ×3 IMPLANT
GOWN STRL REUS W/ TWL XL LVL3 (GOWN DISPOSABLE) ×3 IMPLANT
GOWN STRL REUS W/TWL XL LVL3 (GOWN DISPOSABLE) ×6
KIT BASIN OR (CUSTOM PROCEDURE TRAY) ×3 IMPLANT
KIT ROOM TURNOVER OR (KITS) ×3 IMPLANT
MANIFOLD NEPTUNE II (INSTRUMENTS) ×3 IMPLANT
NEEDLE 18GX1X1/2 (RX/OR ONLY) (NEEDLE) ×3 IMPLANT
PACK ARTHROSCOPY DSU (CUSTOM PROCEDURE TRAY) ×3 IMPLANT
PAD ARMBOARD 7.5X6 YLW CONV (MISCELLANEOUS) ×6 IMPLANT
PADDING CAST COTTON 6X4 STRL (CAST SUPPLIES) ×3 IMPLANT
SET ARTHROSCOPY TUBING (MISCELLANEOUS) ×2
SET ARTHROSCOPY TUBING LN (MISCELLANEOUS) ×1 IMPLANT
SPONGE LAP 4X18 X RAY DECT (DISPOSABLE) ×3 IMPLANT
SUT ETHILON 4 0 PS 2 18 (SUTURE) ×3 IMPLANT
SUT MNCRL AB 3-0 PS2 18 (SUTURE) IMPLANT
SUT VIC AB 2-0 CT1 27 (SUTURE)
SUT VIC AB 2-0 CT1 TAPERPNT 27 (SUTURE) IMPLANT
SYR 20CC LL (SYRINGE) ×3 IMPLANT
TAPE STRIPS DRAPE STRL (GAUZE/BANDAGES/DRESSINGS) IMPLANT
TOWEL OR 17X24 6PK STRL BLUE (TOWEL DISPOSABLE) ×3 IMPLANT
TOWEL OR 17X26 10 PK STRL BLUE (TOWEL DISPOSABLE) ×3 IMPLANT
WAND HAND CNTRL MULTIVAC 90 (MISCELLANEOUS) IMPLANT
WATER STERILE IRR 1000ML POUR (IV SOLUTION) ×3 IMPLANT

## 2015-08-13 NOTE — Progress Notes (Signed)
Received order for Stat ABI at 1635. Spoke with Denny Peon, RN in ED at 1640 to see if patient was available for test. Was informed that patient would be heading to OR soon. Attempted to get in touch with ordering provider, but could not find pager number. Called RN back at 1650 and patient had already left ED to go to OR.  Will attempt ABI at a later time.   Farrel Demark, RDMS, RVT Vascular Lab 08/13/2015

## 2015-08-13 NOTE — ED Provider Notes (Signed)
CSN: [id number]     Arrival date & time 08/13/15  1043 History   First MD Initiated Contact with Patient 08/13/15 1143     Chief Complaint  Patient presents with  . Extremity Pain      Patient is a 75 y.o. male presenting with extremity pain. The history is provided by the patient.  Extremity Pain This is a recurrent problem. Pertinent negatives include no chest pain, no abdominal pain and no shortness of breath.   patient presents with pain in his right foot. Recent admission for same and diagnosed with gout in the ankle and potentially a cellulitis of the right great toe. Discharge couple days ago. Reportedly pain has not improved and swelling is gotten worse. States he cannot walk. Does have a history of critical limb ischemia it sounds like on both legs. Was previously going to get a bypass but did not have it because the wounds heal on her own. Reportedly called Dr. Lajoyce Corners today and was told by staff coming to the ER. No fevers. Has been on antibiotics.  Past Medical History  Diagnosis Date  . Gout   . COPD (chronic obstructive pulmonary disease) (HCC)     LONG TIME SMOKER  . 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 Lajoyce Corners   . Critical lower limb ischemia   . Pyelonephritis 06/23/2015  . Arthritis     "hands" (06/23/2015)  . Depression     "periods of depression" (06/23/2015)  . Adult failure to thrive     /notes 06/23/2015   Past Surgical History  Procedure Laterality Date  . Knee cartilage surgery Left 1960's    football injury  . Amputation Right 03/01/2014    Procedure: AMPUTATION RAY;  Surgeon: Nadara Mustard, MD;  Location: [facility] OR;  Service: Orthopedics;  Laterality: Right;  Right Foot 5th Ray Amputation  . Amputation Right 04/26/2014    Procedure: Right Foot 4th Ray Amputation;  Surgeon: Nadara Mustard, MD;  Location: [facility] OR;  Service: Orthopedics;  Laterality: Right;  . Lower extremity angiogram N/A 05/27/2014    Procedure: LOWER EXTREMITY ANGIOGRAM;   Surgeon: Runell Gess, MD;  Location: [facility] CATH LAB;  Service: Cardiovascular;  Laterality: N/A;  . Inguinal hernia repair Left   . Knee ligament reconstruction Left 1960's  . Cataract extraction w/ intraocular lens  implant, bilateral Bilateral    Family History  Problem Relation Age of Onset  . Heart failure Father   . Heart attack Father   . Heart failure Brother    Social History  Substance Use Topics  . Smoking status: Former Smoker -- 0.50 packs/day for 57 years    Types: Cigarettes    Quit date: 12/12/2014  . Smokeless tobacco: Never Used  . Alcohol Use: Yes     Comment: 06/23/2015 "was drinking alot of beer; quit in 2015"    Review of Systems  Constitutional: Negative for appetite change.  Respiratory: Negative for shortness of breath.   Cardiovascular: Negative for chest pain.  Gastrointestinal: Negative for abdominal pain.  Musculoskeletal: Positive for joint swelling and gait problem.  Skin: Positive for color change.  Neurological: Negative for weakness.  Hematological: Negative for adenopathy.      Allergies  Review of patient's allergies indicates no known allergies.  Home Medications   Prior to Admission medications   Medication Sig Start Date End Date Taking? Authorizing Provider  albuterol (PROVENTIL HFA;VENTOLIN HFA) 108 (90 BASE) MCG/ACT inhaler Inhale 2  puffs into the lungs 2 (two) times daily. Take every daily per niece   Yes Historical Provider, MD  colchicine 0.6 MG tablet Take 1 tablet (0.6 mg total) by mouth 2 (two) times daily. 08/11/15 08/16/15 Yes Rolly Salter, MD  doxycycline (VIBRA-TABS) 100 MG tablet Take 1 tablet (100 mg total) by mouth every 12 (twelve) hours. 08/11/15 08/17/15 Yes Rolly Salter, MD  HYDROcodone-acetaminophen (NORCO/VICODIN) 5-325 MG tablet Take 1 tablet by mouth every 4 (four) hours as needed for moderate pain. 08/11/15  Yes Rolly Salter, MD  saccharomyces boulardii (FLORASTOR) 250 MG capsule Take 1 capsule (250 mg  total) by mouth 2 (two) times daily. 08/11/15  Yes Rolly Salter, MD  traZODone (DESYREL) 50 MG tablet Take 0.5 tablets (25 mg total) by mouth at bedtime as needed for sleep. Patient taking differently: Take 50 mg by mouth at bedtime.  06/26/15  Yes Richarda Overlie, MD   BP 116/98 mmHg  Pulse 76  Temp(Src) 97.8 F (36.6 C) (Oral)  Resp 17  SpO2 99% Physical Exam  Constitutional: He appears well-developed.  HENT:  Head: Atraumatic.  Neck: Neck supple.  Cardiovascular: Normal rate.   Pulmonary/Chest: Effort normal.  Abdominal: There is no tenderness.  Musculoskeletal: He exhibits tenderness.  Edema to right lower leg with some erythema around the ankle. Right great toe has had its nail removed. No purulent drainage. There is erythema and one pustule on the top of the right foot. There is pitting edema. Unable to palpate a good pulse. Third and fourth toes of been removed on this foot.  Skin: Skin is warm.  Psychiatric: He has a normal mood and affect.      ED Course  Procedures (including critical care time) Labs Review Labs Reviewed  CBC WITH DIFFERENTIAL/PLATELET - Abnormal; Notable for the following:    WBC 12.3 (*)    RBC 4.19 (*)    Hemoglobin 9.9 (*)    HCT 31.4 (*)    MCV 74.9 (*)    MCH 23.6 (*)    RDW 18.9 (*)    Neutro Abs 9.1 (*)    Monocytes Absolute 1.1 (*)    All other components within normal limits  COMPREHENSIVE METABOLIC PANEL - Abnormal; Notable for the following:    Glucose, Bld 133 (*)    Creatinine, Ser 1.25 (*)    Calcium 8.5 (*)    Total Protein 5.7 (*)    Albumin 2.4 (*)    AST 14 (*)    ALT 12 (*)    GFR calc non Af Amer 55 (*)    All other components within normal limits  SEDIMENTATION RATE - Abnormal; Notable for the following:    Sed Rate 61 (*)    All other components within normal limits  C-REACTIVE PROTEIN - Abnormal; Notable for the following:    CRP 9.6 (*)    All other components within normal limits  URIC ACID    Imaging  Review No results found. I have personally reviewed and evaluated these images and lab results as part of my medical decision-making.   EKG Interpretation None      MDM   Final diagnoses:  Right ankle pain    Patient with pain in his right foot and ankle. Recent admission for same. Thought to be gout however white count is going up. Seen in the ER by Dr. Lajoyce Corners who reviewed the lab findings also. States he'll take the patient to the OR for washout.  Benjiman Core, MD 08/13/15 [phone]

## 2015-08-13 NOTE — ED Notes (Signed)
Pt had toenail removed on right foot last Tuesday by pediatrist.  Pt had swelling and pain since procedure.  Pt was admitted to [facility] on Saturday for same and d/c Monday with pain medication and antibiotics.  Pt reports no relief since d/c.

## 2015-08-13 NOTE — Anesthesia Preprocedure Evaluation (Addendum)
Anesthesia Evaluation  Patient identified by MRN, date of birth, ID band Patient awake    Reviewed: Allergy & Precautions, NPO status , Patient's Chart, lab work & pertinent test results  Airway Mallampati: I  TM Distance: >3 FB Neck ROM: Full    Dental  (+) Missing, Dental Advisory Given,    Pulmonary COPD,  COPD inhaler, former smoker,    breath sounds clear to auscultation       Cardiovascular + Peripheral Vascular Disease   Rhythm:Regular Rate:Normal     Neuro/Psych    GI/Hepatic   Endo/Other    Renal/GU      Musculoskeletal   Abdominal   Peds  Hematology   Anesthesia Other Findings   Reproductive/Obstetrics                            Anesthesia Physical Anesthesia Plan  ASA: III  Anesthesia Plan: General   Post-op Pain Management:    Induction:   Airway Management Planned: LMA  Additional Equipment: None  Intra-op Plan:   Post-operative Plan: Extubation in OR  Informed Consent: I have reviewed the patients History and Physical, chart, labs and discussed the procedure including the risks, benefits and alternatives for the proposed anesthesia with the patient or authorized representative who has indicated his/her understanding and acceptance.   Dental advisory given  Plan Discussed with: CRNA, Anesthesiologist and Surgeon  Anesthesia Plan Comments:         Anesthesia Quick Evaluation

## 2015-08-13 NOTE — H&P (Signed)
[facility] History and Physical  RONDEL EPISCOPO GBT:[id number] DOB: [birth year] DOA: 08/13/2015  Referring physician:  PCP: Imelda Pillow, NP   Chief Complaint: Right lower extremity cellulitis  HPI: Caleb Hays is a 75 y.o. male with a history of nonhealing wounds due to peripheral vascular disease,with recent hospitalization from 08/11/2015 through 08/13/2015 for right lower extremity pain, suspicious for cellulitis after a right toenail was removed, discharged on doxycycline, now presenting on 08/13/2015 due to worsening of symptoms. He reports that the pain is constant, but the swelling has exacerbated. He cannot ambulate at this time due to the pain. He is now being admitted for incision and debridement of the right foot, and for the treatment of right lower extremity cellulitis. Of note, last Gram stain showed gram-positive cocci in pairs. A new culture will be drawn. He denies any worsening shortness of breath, chest pain, nausea vomiting or diarrhea. He denies any other areas of cellulitis.  Review of Systems:  Constitutional:  No weight loss, night sweats, Fevers, chills, +fatigue.  HEENT:  No headaches, Difficulty swallowing,Tooth/dental problems,Sore throat,  No sneezing, itching, ear ache, nasal congestion, post nasal drip,  Cardio-vascular:  No chest pain, Orthopnea, PND, swelling in lower extremities, anasarca, dizziness, palpitations  GI:  No heartburn, indigestion, abdominal pain, nausea, vomiting, diarrhea, change in bowel habits, loss of appetite  Resp:  No shortness of breath with exertion or at rest. No excess mucus, no productive cough, No non-productive cough, No coughing up of blood.No change in color of mucus.No wheezing.No chest wall deformity  Skin:  no rash or lesions.  GU:  no dysuria, change in color of urine, no urgency or frequency. No flank pain.  Musculoskeletal:  No joint pain . Right lower extremity erythema No decreased range of  motion. No back pain.  Psych:  No change in mood or affect. No depression or anxiety. No memory loss.   Past Medical History  Diagnosis Date  . Gout   . COPD (chronic obstructive pulmonary disease) (HCC)     LONG TIME SMOKER  . 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 Sharol Given   . Critical lower limb ischemia   . Pyelonephritis 06/23/2015  . Arthritis     "hands" (06/23/2015)  . Depression     "periods of depression" (06/23/2015)  . Adult failure to thrive     /notes 06/23/2015   Past Surgical History  Procedure Laterality Date  . Knee cartilage surgery Left 1960's    football injury  . Amputation Right 03/01/2014    Procedure: AMPUTATION RAY;  Surgeon: Newt Minion, MD;  Location: [place];  Service: Orthopedics;  Laterality: Right;  Right Foot 5th Ray Amputation  . Amputation Right 04/26/2014    Procedure: Right Foot 4th Ray Amputation;  Surgeon: Newt Minion, MD;  Location: [place];  Service: Orthopedics;  Laterality: Right;  . Lower extremity angiogram N/A 05/27/2014    Procedure: LOWER EXTREMITY ANGIOGRAM;  Surgeon: Lorretta Harp, MD;  Location: [facility] CATH LAB;  Service: Cardiovascular;  Laterality: N/A;  . Inguinal hernia repair Left   . Knee ligament reconstruction Left 1960's  . Cataract extraction w/ intraocular lens  implant, bilateral Bilateral    Social History:  reports that he quit smoking about 8 months ago. His smoking use included Cigarettes. He has a 28.5 pack-year smoking history. He has never used smokeless tobacco. He reports that he drinks alcohol. He reports that he does not  use illicit drugs. Lives alone. Uses a walker  No Known Allergies  Family History  Problem Relation Age of Onset  . Heart failure Father   . Heart attack Father   . Heart failure Brother     Prior to Admission medications   Medication Sig Start Date End Date Taking? Authorizing Provider  albuterol (PROVENTIL HFA;VENTOLIN HFA) 108 (90 BASE) MCG/ACT inhaler Inhale 2  puffs into the lungs 2 (two) times daily. Take every daily per niece   Yes Historical Provider, MD  colchicine 0.6 MG tablet Take 1 tablet (0.6 mg total) by mouth 2 (two) times daily. 08/11/15 08/16/15 Yes Lavina Hamman, MD  doxycycline (VIBRA-TABS) 100 MG tablet Take 1 tablet (100 mg total) by mouth every 12 (twelve) hours. 08/11/15 08/17/15 Yes Lavina Hamman, MD  HYDROcodone-acetaminophen (NORCO/VICODIN) 5-325 MG tablet Take 1 tablet by mouth every 4 (four) hours as needed for moderate pain. 08/11/15  Yes Lavina Hamman, MD  saccharomyces boulardii (FLORASTOR) 250 MG capsule Take 1 capsule (250 mg total) by mouth 2 (two) times daily. 08/11/15  Yes Lavina Hamman, MD  traZODone (DESYREL) 50 MG tablet Take 0.5 tablets (25 mg total) by mouth at bedtime as needed for sleep. Patient taking differently: Take 50 mg by mouth at bedtime.  06/26/15  Yes Reyne Dumas, MD   Physical Exam: Filed Vitals:   08/13/15 1045 08/13/15 1056 08/13/15 1200 08/13/15 1215  BP:  137/73 122/70 116/98  Pulse:  82 76 76  Temp:  97.8 F (36.6 C)    TempSrc:  Oral    Resp:  17    SpO2: 97% 99% 100% 99%    Wt Readings from Last 3 Encounters:  08/05/15 76.204 kg (168 lb)  07/22/15 75.751 kg (167 lb)  07/04/15 79.379 kg (175 lb)    General:  Appears anxious, uncomfortable due to pain   Eyes: PERRL, normal lids, irises & conjunctiva ENT: grossly normal hearing, lips & tongue Neck: no LAD, masses or thyromegaly Cardiovascular: RRR, no m/r/g. Right lower extremity swelling with erythema, pulses present Respiratory: CTA bilaterally, no w/r/r. Normal respiratory effort. Abdomen: soft, non tender, bowel sounds present Skin: notable for right lower extremity erythema and a scar noted on the left great toe, and a forming ulceration on the left second toe, nonpurulent, none.no other rash or induration seen on limited exam. Severe onychomycosis noted Musculoskeletal: grossly normal tone BUE/BLE Psychiatric: grossly anxiousmood  and affect, speech fluent and appropriate Neurologic: grossly non-focal.          Labs on Admission:  Basic Metabolic Panel:  Recent Labs Lab 08/09/15 2251 08/10/15 0820 08/11/15 0554 08/13/15 1232  NA 140 139 137 138  K 4.6 4.5 4.4 4.2  CL 107 107 109 109  CO2 21* 21* 22 23  GLUCOSE 109* 113* 91 133*  BUN 29* 25* 19 11  CREATININE 1.81* 1.60* 1.39* 1.25*  CALCIUM 8.5* 8.2* 8.3* 8.5*   Liver Function Tests:  Recent Labs Lab 08/13/15 1232  AST 14*  ALT 12*  ALKPHOS 77  BILITOT 0.5  PROT 5.7*  ALBUMIN 2.4*  CBC:  Recent Labs Lab 08/09/15 2251 08/10/15 0820 08/11/15 0554 08/13/15 1232  WBC 9.0 8.1 7.7 12.3*  NEUTROABS 6.6  --  4.7 9.1*  HGB 9.2* 8.7* 8.3* 9.9*  HCT 29.3* 27.5* 26.1* 31.4*  MCV 74.9* 75.5* 75.0* 74.9*  PLT 331 278 287 304   Cardiac Enzymes:  Recent Labs Lab 08/10/15 0820  CKTOTAL 21*    Radiological  Exams on Admission: No results found.   Assessment/Plan Active Problems:   COPD (chronic obstructive pulmonary disease) (HCC)   Cellulitis   Cellulitis of right foot   Anemia of chronic disease   Ankle pain   Cellulitis of right lower extremity    Cellulitis/pain right lower extremity  Recently hospitalized from 1/23-1/25 with similar complaints for redness, pain and swelling of the right foot following nail removal. Had elevated ESR, CRP and uric acid levels. Orthopedics ruled out cellulitis at the time. Mild left shift and leukocytosis noted, with GPC in pairs seen. He was discharged on colchicine, and doxycycline. Current white count is 12.3. He is afebrile nontoxic appearing. Will be admitted for Incision and debridement by Dr. Sharol Given  He is afebrile and nontoxic appearing Admit to med-surg, for OR today NPO Blood Cultures x 2 IV antibiotics with vancomycin and Zosyn IV Continue oral pain medications while in hospital with Vicodin. Will add Tylenol, and will add IV Dilaudid as needed CBC in am Will request OT PT evaluation to  help his physical deconditioning, to improve pain Check ABIs  Anemia of chronic disease No bleeding issues at this time. BAseline Hb is 10. Current hemoglobin 9.9 Check CBC in a.m.  COPD, stable  Duonebs as needed, albuterol inhaler as outpatient -O2 as needed  Malnutrition Nutrition consult recommended Check prealbumin   History of gout Last Uric Acid  Is 7.3 today. The patient is on allopurinol since last admission. We will continue with the medication while in the hospital.   Code Status:  Full Code  DVT Prophylaxis: No anticoagulation at this time or SCDs as he is to be admitted for surgery today Family Communication: No family at bedside Disposition Plan: pending on Ortho recommendations.   [facility] E [facility]

## 2015-08-13 NOTE — Progress Notes (Signed)
ANTIBIOTIC CONSULT NOTE - INITIAL  Pharmacy Consult for Vancomycin and Zosyn Indication: cellulitis  No Known Allergies  Patient Measurements:  Total Body Weight: 76.2 kg Ideal Body Weight: 86.8 kg  Vital Signs: Temp: 97.8 F (36.6 C) (01/25 1056) Temp Source: Oral (01/25 1056) BP: 116/98 mmHg (01/25 1215) Pulse Rate: 76 (01/25 1215) Intake/Output from previous day:   Intake/Output from this shift:    Labs:  Recent Labs  08/11/15 0554 08/13/15 1232  WBC 7.7 12.3*  HGB 8.3* 9.9*  PLT 287 304  CREATININE 1.39* 1.25*   Estimated Creatinine Clearance: 56.7 mL/min (by C-G formula based on Cr of 1.25). No results for input(s): VANCOTROUGH, VANCOPEAK, VANCORANDOM, GENTTROUGH, GENTPEAK, GENTRANDOM, TOBRATROUGH, TOBRAPEAK, TOBRARND, AMIKACINPEAK, AMIKACINTROU, AMIKACIN in the last 72 hours.   Microbiology: Recent Results (from the past 720 hour(s))  Wound culture     Status: None   Collection Time: 08/10/15  3:57 AM  Result Value Ref Range Status   Specimen Description WOUND RIGHT TOE  Final   Special Requests NONE  Final   Gram Stain   Final    NO WBC SEEN NO SQUAMOUS EPITHELIAL CELLS SEEN RARE GRAM POSITIVE COCCI IN PAIRS Performed at [facility]    Culture   Final    MODERATE STAPHYLOCOCCUS AUREUS Note: RIFAMPIN AND GENTAMICIN SHOULD NOT BE USED AS SINGLE DRUGS FOR TREATMENT OF STAPH INFECTIONS. Performed at [facility]    Report Status 08/13/2015 FINAL  Final   Organism ID, Bacteria STAPHYLOCOCCUS AUREUS  Final      Susceptibility   Staphylococcus aureus - MIC*    CLINDAMYCIN <=0.25 SENSITIVE Sensitive     ERYTHROMYCIN <=0.25 SENSITIVE Sensitive     GENTAMICIN <=0.5 SENSITIVE Sensitive     LEVOFLOXACIN 0.25 SENSITIVE Sensitive     OXACILLIN 0.5 SENSITIVE Sensitive     RIFAMPIN <=0.5 SENSITIVE Sensitive     TRIMETH/SULFA <=10 SENSITIVE Sensitive     VANCOMYCIN 1 SENSITIVE Sensitive     TETRACYCLINE <=1 SENSITIVE Sensitive    MOXIFLOXACIN <=0.25 SENSITIVE Sensitive     * MODERATE STAPHYLOCOCCUS AUREUS    Medical History: Past Medical History  Diagnosis Date  . Gout   . COPD (chronic obstructive pulmonary disease) (HCC)     LONG TIME SMOKER  . 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 Lajoyce Corners   . Critical lower limb ischemia   . Pyelonephritis 06/23/2015  . Arthritis     "hands" (06/23/2015)  . Depression     "periods of depression" (06/23/2015)  . Adult failure to thrive     /notes 06/23/2015    Medications:   (Not in a hospital admission) Scheduled:  . docusate sodium  100 mg Oral BID  . senna  1 tablet Oral BID   Infusions:  . sodium chloride    . piperacillin-tazobactam    . vancomycin     Assessment: 74yo M w/ hx of recurrent extremity pain. He had a toenail removed on his right foot last week and has had pain and swelling since despite outpatient doxycycline. Pharmacy consulted for Vancomycin and Zosyn for cellulitis. Afebrile, WBC 12.3, sCr 1.25, CrCl 56.7, no cx results.  1/25>>Vancomycin>> 1/25>>Zosyn>>  1/25 Blood(x2)>>  Goal of Therapy:  Vancomycin trough level 10-15 mcg/ml  Plan:  Vancomycin  IV x1, then  IV q24h Zosyn 3.375g IV q8h Measure antibiotic drug levels at steady state Follow up culture results, abx tx, clinical course  Charolette Child, PharmD Student  I agree with the above assessment and plan.  Louie Casa, PharmD, BCPS 08/13/2015, 3:51 PM

## 2015-08-13 NOTE — Op Note (Signed)
08/13/2015  6:35 PM  PATIENT:  Caleb Hays    PRE-OPERATIVE DIAGNOSIS:  Septic Right Ankle with Gout  POST-OPERATIVE DIAGNOSIS:  Same  PROCEDURE:  ANKLE ARTHROSCOPY  SURGEON:  Nadara Mustard, MD  PHYSICIAN ASSISTANT:None ANESTHESIA:   General  PREOPERATIVE INDICATIONS:  [name] is a  75 y.o. male with a diagnosis of Septic Right Ankle who failed conservative measures and elected for surgical management.    The risks benefits and alternatives were discussed with the patient preoperatively including but not limited to the risks of infection, bleeding, nerve injury, cardiopulmonary complications, the need for revision surgery, among others, and the patient was willing to proceed.  OPERATIVE IMPLANTS: None  OPERATIVE FINDINGS: Tophaceous gouty material this was sent for cultures and Gram stain and crystal evaluation  OPERATIVE PROCEDURE: Patient was brought to the operating room and underwent a general anesthetic. After adequate levels anesthesia obtained patient's right lower extremity was prepped using DuraPrep draped into a sterile field a timeout was called. The scope was inserted from the anterior medial portal and anterior lateral working portal was established with a shaver. When the anterior medial portal was established. Tophaceous gouty material came out of the wound. This was sent for cultures and Gram stain and crystal evaluation. The shaver was inserted through the anterior lateral portal. Visualization showed a significant amount of debris from osteoarthritis he had significant amount of synovitis and patient underwent extensive synovectomy as well as removal of loose bodies and debridement of articular cartilage. The gutters were cleaned the anterior joint line was cleaned. The instruments removed the portals were closed using 4-0 nylon. A sterile compressive dressing was applied. Patient was extubated taken to the PACU in stable condition.  With patient's   significant peripheral vascular disease this may have been a tophaceous gout that became infected secondarily. Agree with continuing the IV antibiotics continue with colchicine for gout I will follow-up the office in 2 weeks.  Continue with vascular workup.

## 2015-08-13 NOTE — Anesthesia Procedure Notes (Signed)
Procedure Name: LMA Insertion Date/Time: 08/13/2015 6:08 PM Performed by: Charm Barges, Krissi Willaims R Pre-anesthesia Checklist: Patient identified, Emergency Drugs available, Suction available, Patient being monitored and Timeout performed Patient Re-evaluated:Patient Re-evaluated prior to inductionOxygen Delivery Method: Circle system utilized Preoxygenation: Pre-oxygenation with 100% oxygen Intubation Type: IV induction Ventilation: Mask ventilation without difficulty LMA: LMA inserted LMA Size: 5.0 Number of attempts: 1 Placement Confirmation: positive ETCO2 Tube secured with: Tape Dental Injury: Teeth and Oropharynx as per pre-operative assessment

## 2015-08-13 NOTE — Anesthesia Postprocedure Evaluation (Signed)
Anesthesia Post Note  Patient: Caleb Hays  Procedure(s) Performed: Procedure(s) (LRB): ANKLE ARTHROSCOPY (Right)  Patient location during evaluation: PACU Anesthesia Type: General Level of consciousness: awake and alert Pain management: pain level controlled Vital Signs Assessment: post-procedure vital signs reviewed and stable Respiratory status: spontaneous breathing, nonlabored ventilation, respiratory function stable and patient connected to nasal cannula oxygen Cardiovascular status: blood pressure returned to baseline and stable Postop Assessment: no signs of nausea or vomiting Anesthetic complications: no    Last Vitals:  Filed Vitals:   08/13/15 1910 08/13/15 1925  BP: 101/74 146/77  Pulse: 60 61  Temp:    Resp: 16 12    Last Pain:  Filed Vitals:   08/13/15 1942  PainSc: 0-No pain                 Milly Goggins,JAMES TERRILL

## 2015-08-13 NOTE — Transfer of Care (Signed)
Immediate Anesthesia Transfer of Care Note  Patient: Caleb Hays  Procedure(s) Performed: Procedure(s): ANKLE ARTHROSCOPY (Right)  Patient Location: PACU  Anesthesia Type:General  Level of Consciousness: awake, oriented and patient cooperative  Airway & Oxygen Therapy: Patient Spontanous Breathing and Patient connected to nasal cannula oxygen  Post-op Assessment: Report given to RN, Post -op Vital signs reviewed and stable and Patient moving all extremities  Post vital signs: Reviewed and stable  Last Vitals:  Filed Vitals:   08/13/15 1615 08/13/15 1838  BP: 123/69   Pulse: 74   Temp:  36.6 C  Resp:      Complications: No apparent anesthesia complications

## 2015-08-13 NOTE — Consult Note (Signed)
Reason for Consult: Cellulitis swelling right ankle Referring Physician: Dr. Davonna Belling  Caleb Hays is an 75 y.o. male.  HPI: Patient is a 69 old gentleman with peripheral vascular disease status post ray amputation the right which required nitroglycerin patches to heal. Patient presented to the hospital recently with a red swollen ankle his uric acid was greater than 9 he was treated with antibiotics as well as gout medication and was discharged on allopurinol. Patient presents this morning with increasing pain redness and swelling around the right ankle. His uric acid is greater than 7 he has elevated white cell count elevated sedimentation rate elevated C-reactive protein.  Past Medical History  Diagnosis Date  . Gout   . COPD (chronic obstructive pulmonary disease) (HCC)     LONG TIME SMOKER  . 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 Sharol Given   . Critical lower limb ischemia   . Pyelonephritis 06/23/2015  . Arthritis     "hands" (06/23/2015)  . Depression     "periods of depression" (06/23/2015)  . Adult failure to thrive     /notes 06/23/2015  . Physical deconditioning     Past Surgical History  Procedure Laterality Date  . Knee cartilage surgery Left 1960's    football injury  . Amputation Right 03/01/2014    Procedure: AMPUTATION RAY;  Surgeon: Newt Minion, MD;  Location: [place];  Service: Orthopedics;  Laterality: Right;  Right Foot 5th Ray Amputation  . Amputation Right 04/26/2014    Procedure: Right Foot 4th Ray Amputation;  Surgeon: Newt Minion, MD;  Location: [place];  Service: Orthopedics;  Laterality: Right;  . Lower extremity angiogram N/A 05/27/2014    Procedure: LOWER EXTREMITY ANGIOGRAM;  Surgeon: Lorretta Harp, MD;  Location[facility] CATH LAB;  Service: Cardiovascular;  Laterality: N/A;  . Inguinal hernia repair Left   . Knee ligament reconstruction Left 1960's  . Cataract extraction w/ intraocular lens  implant, bilateral Bilateral      Family History  Problem Relation Age of Onset  . Heart failure Father   . Heart attack Father   . Heart failure Brother     Social History:  reports that he quit smoking about 8 months ago. His smoking use included Cigarettes. He has a 28.5 pack-year smoking history. He has never used smokeless tobacco. He reports that he drinks alcohol. He reports that he does not use illicit drugs.  Allergies: No Known Allergies  Medications: I have reviewed the patient's current medications.  Results for orders placed or performed during the hospital encounter of 08/13/15 (from the past 48 hour(s))  CBC with Differential     Status: Abnormal   Collection Time: 08/13/15 12:32 PM  Result Value Ref Range   WBC 12.3 (H) 4.0 - 10.5 K/uL   RBC 4.19 (L) 4.22 - 5.81 MIL/uL   Hemoglobin 9.9 (L) 13.0 - 17.0 g/dL   HCT 31.4 (L) 39.0 - 52.0 %   MCV 74.9 (L) 78.0 - 100.0 fL   MCH 23.6 (L) 26.0 - 34.0 pg   MCHC 31.5 30.0 - 36.0 g/dL   RDW 18.9 (H) 11.5 - 15.5 %   Platelets 304 150 - 400 K/uL   Neutrophils Relative % 74 %   Lymphocytes Relative 17 %   Monocytes Relative 9 %   Eosinophils Relative 0 %   Basophils Relative 0 %   Band Neutrophils 0 %   Metamyelocytes Relative 0 %  Myelocytes 0 %   Promyelocytes Absolute 0 %   Blasts 0 %   nRBC 0 0 /100 WBC   Other 0 %   Neutro Abs 9.1 (H) 1.7 - 7.7 K/uL   Lymphs Abs 2.1 0.7 - 4.0 K/uL   Monocytes Absolute 1.1 (H) 0.1 - 1.0 K/uL   Eosinophils Absolute 0.0 0.0 - 0.7 K/uL   Basophils Absolute 0.0 0.0 - 0.1 K/uL   RBC Morphology ELLIPTOCYTES     Comment: TEARDROP CELLS POLYCHROMASIA PRESENT    WBC Morphology      MODERATE LEFT SHIFT (>5% METAS AND MYELOS,OCC PRO NOTED)  Comprehensive metabolic panel     Status: Abnormal   Collection Time: 08/13/15 12:32 PM  Result Value Ref Range   Sodium 138 135 - 145 mmol/L   Potassium 4.2 3.5 - 5.1 mmol/L   Chloride 109 101 - 111 mmol/L   CO2 23 22 - 32 mmol/L   Glucose, Bld 133 (H) 65 - 99 mg/dL   BUN  11 6 - 20 mg/dL   Creatinine, Ser 1.25 (H) 0.61 - 1.24 mg/dL   Calcium 8.5 (L) 8.9 - 10.3 mg/dL   Total Protein 5.7 (L) 6.5 - 8.1 g/dL   Albumin 2.4 (L) 3.5 - 5.0 g/dL   AST 14 (L) 15 - 41 U/L   ALT 12 (L) 17 - 63 U/L   Alkaline Phosphatase 77 38 - 126 U/L   Total Bilirubin 0.5 0.3 - 1.2 mg/dL   GFR calc non Af Amer 55 (L) >60 mL/min   GFR calc Af Amer >60 >60 mL/min    Comment: (NOTE) The eGFR has been calculated using the CKD EPI equation. This calculation has not been validated in all clinical situations. eGFR's persistently <60 mL/min signify possible Chronic Kidney Disease.    Anion gap 6 5 - 15  Sedimentation rate     Status: Abnormal   Collection Time: 08/13/15 12:32 PM  Result Value Ref Range   Sed Rate 61 (H) 0 - 16 mm/hr  C-reactive protein     Status: Abnormal   Collection Time: 08/13/15 12:32 PM  Result Value Ref Range   CRP 9.6 (H) <1.0 mg/dL  Uric acid     Status: None   Collection Time: 08/13/15 12:32 PM  Result Value Ref Range   Uric Acid, Serum 7.3 4.4 - 7.6 mg/dL    No results found.  Review of Systems  All other systems reviewed and are negative.  Blood pressure 123/69, pulse 74, temperature 97.8 F (36.6 C), temperature source Oral, resp. rate 17, SpO2 100 %. Physical Exam On examination patient does not have a palpable pulse but he does have a lot of swelling around the ankle. He has some mild ischemic changes on the dorsum of his foot he has a mild ischemic pressure area over the lateral aspect of his foot his previous Ray amputation is healed well. He has redness and swelling around the ankle and this is exquisitely tender to palpation. Assessment/Plan: Assessment: Peripheral vascular disease status post ray amputation with mild ischemic ulcers on the dorsum of his foot with redness swelling cellulitis around the ankle.  Plan: Patient does seem to have a septic ankle joint. We'll plan for arthroscopic irrigation and debridement. Patient will need a  vascular workup including an ankle-brachial indices to further identify his vascular status. I agree with IV antibiotics postoperatively. I will obtain cultures interoperatively.  Kipling Graser V 08/13/2015, 5:51 PM

## 2015-08-14 ENCOUNTER — Encounter (HOSPITAL_COMMUNITY): Payer: Self-pay | Admitting: Orthopedic Surgery

## 2015-08-14 DIAGNOSIS — L03115 Cellulitis of right lower limb: Secondary | ICD-10-CM

## 2015-08-14 DIAGNOSIS — J42 Unspecified chronic bronchitis: Secondary | ICD-10-CM

## 2015-08-14 DIAGNOSIS — M25571 Pain in right ankle and joints of right foot: Secondary | ICD-10-CM

## 2015-08-14 LAB — BASIC METABOLIC PANEL
Anion gap: 9 (ref 5–15)
BUN: 12 mg/dL (ref 6–20)
CALCIUM: 8.2 mg/dL — AB (ref 8.9–10.3)
CO2: 24 mmol/L (ref 22–32)
CREATININE: 1.22 mg/dL (ref 0.61–1.24)
Chloride: 107 mmol/L (ref 101–111)
GFR calc non Af Amer: 57 mL/min — ABNORMAL LOW (ref >60)
GLUCOSE: 92 mg/dL (ref 65–99)
Potassium: 4.5 mmol/L (ref 3.5–5.1)
Sodium: 140 mmol/L (ref 135–145)

## 2015-08-14 LAB — CBC
HCT: 28.1 % — ABNORMAL LOW (ref 39.0–52.0)
Hemoglobin: 8.7 g/dL — ABNORMAL LOW (ref 13.0–17.0)
MCH: 23.6 pg — AB (ref 26.0–34.0)
MCHC: 31 g/dL (ref 30.0–36.0)
MCV: 76.4 fL — ABNORMAL LOW (ref 78.0–100.0)
PLATELETS: 257 10*3/uL (ref 150–400)
RBC: 3.68 MIL/uL — AB (ref 4.22–5.81)
RDW: 19 % — AB (ref 11.5–15.5)
WBC: 8.5 10*3/uL (ref 4.0–10.5)

## 2015-08-14 MED ORDER — HYDROMORPHONE HCL 1 MG/ML IJ SOLN
1.0000 mg | Freq: Once | INTRAMUSCULAR | Status: AC
  Administered 2015-08-15: 1 mg via INTRAVENOUS
  Filled 2015-08-14: qty 1

## 2015-08-14 MED ORDER — HYDROCODONE-ACETAMINOPHEN 5-325 MG PO TABS
1.0000 | ORAL_TABLET | Freq: Four times a day (QID) | ORAL | Status: AC | PRN
  Administered 2015-08-14 – 2015-08-15 (×4): 2 via ORAL
  Filled 2015-08-14 (×4): qty 2

## 2015-08-14 NOTE — Progress Notes (Signed)
Pt c/o constant right ankle throbbing pain 6/10  despite dilaudid 0.5 mg IV given, on call md notified with new order.

## 2015-08-14 NOTE — Progress Notes (Signed)
[facility] PROGRESS NOTE  Caleb Hays YQM:[id number] DOB: [birth year] DOA: 08/13/2015 PCP: Egbert Garibaldi, NP  Assessment/Plan: 1. Cellulitis- patient is status post ankle arthroscopy, incision and drainage by Dr Lajoyce Corners. Continue vancomycin and Zosyn. Follow culture results. 2. COPD- stable, DuoNeb's when necessary 3. History of gout- last uric acid was 7.3, continue allopurinol. 4. Impacted cerumen left ear- patient will follow up with PCP as outpatient for removal of wax  from left ear.  Code Status: Full code Family Communication: *No family present at bedside Disposition Plan: Home when cleared by orthopedics   Consultants:  orthopedics  Procedures:  Ankle arthroscopy  Antibiotics:  Vancomycin    Zosyn  HPI/Subjective: 75 y.o. male with a history of nonhealing wounds due to peripheral vascular disease,with recent hospitalization from 08/11/2015 through 08/13/2015 for right lower extremity pain, suspicious for cellulitis after a right toenail was removed, discharged on doxycycline, now presenting on 08/13/2015 due to worsening of symptoms. He reports that the pain is constant, but the swelling has exacerbated. He cannot ambulate at this time due to the pain. He is now being admitted for incision and debridement of the right foot, and for the treatment of right lower extremity cellulitis. Of note, last Gram stain showed gram-positive cocci in pairs. A new culture will be drawn. He denies any worsening shortness of breath, chest pain, nausea vomiting or diarrhea. He denies any other areas of cellulitis.  Patient complains of hearing loss in left ear over the past 2 days.  Objective: Filed Vitals:   08/14/15 0536 08/14/15 1302  BP: 109/72 135/72  Pulse: 70 66  Temp: 97.6 F (36.4 C) 97.6 F (36.4 C)  Resp: 18 18    Intake/Output Summary (Last 24 hours) at 08/14/15 1540 Last data filed at 08/14/15 1337  Gross per 24 hour  Intake   1480 ml  Output   1125  ml  Net    355 ml   Filed Weights   08/13/15 2007  Weight: 79.1 kg (174 lb 6.1 oz)    Exam:   General:  Appears in no acute distress  Cardiology -S1-S2 regular  Respiratory: Clear bilaterally  AbdSoft, nontender, no organomegaly  Musculoskeletal- right lower extremity in dressing  HEENT- wax noted in the left ear canal  Data Reviewed: Basic Metabolic Panel:  Recent Labs Lab 08/09/15 2251 08/10/15 0820 08/11/15 0554 08/13/15 1232 08/14/15 0830  NA 140 139 137 138 140  K 4.6 4.5 4.4 4.2 4.5  CL 107 107 109 109 107  CO2 21* 21* GLUCOSE 109* 113* 91 133* 92  BUN 29* 25* CREATININE 1.81* 1.60* 1.39* 1.25* 1.22  CALCIUM 8.5* 8.2* 8.3* 8.5* 8.2*   Liver Function Tests:  Recent Labs Lab 08/13/15 1232  AST 14*  ALT 12*  ALKPHOS 77  BILITOT 0.5  PROT 5.7*  ALBUMIN 2.4*   CBC:  Recent Labs Lab 08/09/15 2251 08/10/15 0820 08/11/15 0554 08/13/15 1232 08/14/15 0830  WBC 9.0 8.1 7.7 12.3* 8.5  NEUTROABS 6.6  --  4.7 9.1*  --   HGB 9.2* 8.7* 8.3* 9.9* 8.7*  HCT 29.3* 27.5* 26.1* 31.4* 28.1*  MCV 74.9* 75.5* 75.0* 74.9* 76.4*  PLT 331 278 287 304 257   Cardiac Enzymes:  Recent Labs Lab 08/10/15 0820  CKTOTAL 21*   BNo results for input(s): PROBNP in the last 8760 hours.  CBG: No results for input(s): GLUCAP in the last 168 hours.  Recent Results (from the  past 240 hour(s))  Wound culture     Status: None   Collection Time: 08/10/15  3:57 AM  Result Value Ref Range Status   Specimen Description WOUND RIGHT TOE  Final   Special Requests NONE  Final   Gram Stain   Final    NO WBC SEEN NO SQUAMOUS EPITHELIAL CELLS SEEN RARE GRAM POSITIVE COCCI IN PAIRS Performed at [facility]    Culture   Final    MODERATE STAPHYLOCOCCUS AUREUS Note: RIFAMPIN AND GENTAMICIN SHOULD NOT BE USED AS SINGLE DRUGS FOR TREATMENT OF STAPH INFECTIONS. Performed at [facility]    Report Status 08/13/2015 FINAL  Final    Organism ID, Bacteria STAPHYLOCOCCUS AUREUS  Final      Susceptibility   Staphylococcus aureus - MIC*    CLINDAMYCIN <=0.25 SENSITIVE Sensitive     ERYTHROMYCIN <=0.25 SENSITIVE Sensitive     GENTAMICIN <=0.5 SENSITIVE Sensitive     LEVOFLOXACIN 0.25 SENSITIVE Sensitive     OXACILLIN 0.5 SENSITIVE Sensitive     RIFAMPIN <=0.5 SENSITIVE Sensitive     TRIMETH/SULFA <=10 SENSITIVE Sensitive     VANCOMYCIN 1 SENSITIVE Sensitive     TETRACYCLINE <=1 SENSITIVE Sensitive     MOXIFLOXACIN <=0.25 SENSITIVE Sensitive     * MODERATE STAPHYLOCOCCUS AUREUS  Culture, blood (routine x 2)     Status: None (Preliminary result)   Collection Time: 08/13/15  3:36 PM  Result Value Ref Range Status   Specimen Description BLOOD RIGHT FOREARM  Final   Special Requests BOTTLES DRAWN AEROBIC AND ANAEROBIC 5CC  Final   Culture NO GROWTH < 24 HOURS  Final   Report Status PENDING  Incomplete  Culture, blood (routine x 2)     Status: None (Preliminary result)   Collection Time: 08/13/15  3:43 PM  Result Value Ref Range Status   Specimen Description BLOOD RIGHT ANTECUBITAL  Final   Special Requests BOTTLES DRAWN AEROBIC AND ANAEROBIC 5CC  Final   Culture NO GROWTH < 24 HOURS  Final   Report Status PENDING  Incomplete  Anaerobic culture     Status: None (Preliminary result)   Collection Time: 08/13/15  6:27 PM  Result Value Ref Range Status   Specimen Description TISSUE RIGHT ANKLE  Final   Special Requests PATIENT ON FOLLOWING VANC  Final   Gram Stain   Final    RARE WBC PRESENT, PREDOMINANTLY PMN NO ORGANISMS SEEN Performed at [facility]    Culture PENDING  Incomplete   Report Status PENDING  Incomplete  Tissue culture     Status: None (Preliminary result)   Collection Time: 08/13/15  6:27 PM  Result Value Ref Range Status   Specimen Description TISSUE RIGHT ANKLE  Final   Special Requests PATIENT ON FOLLOWING VANC  Final   Gram Stain   Final    RARE WBC PRESENT, PREDOMINANTLY PMN NO  ORGANISMS SEEN Performed at [facility]    Culture NO GROWTH Performed at [facility]   Final   Report Status PENDING  Incomplete     Studies: No results found.  Scheduled Meds: . albuterol  3 mL Inhalation BID  . colchicine  0.6 mg Oral BID  . docusate sodium  100 mg Oral BID  . piperacillin-tazobactam (ZOSYN)  IV  3.375 g Intravenous 3 times per day  . senna  1 tablet Oral BID  . vancomycin  1,250 mg Intravenous Q24H   Continuous Infusions: . sodium chloride Stopped (08/14/15 0802)  Active Problems:   COPD (chronic obstructive pulmonary disease) (HCC)   Cellulitis   Cellulitis of right foot   Anemia of chronic disease   Ankle pain   Cellulitis of right lower extremity    Time spent: 25 min    [facility]  [facility] Pager [phone]*. If 7PM-7AM, please contact night-coverage at [email or web address], password [facility] 08/14/2015, 3:40 PM  LOS: 1 day

## 2015-08-14 NOTE — Evaluation (Signed)
Physical Therapy Evaluation Patient Details Name: Caleb Hays MRN: [id number] DOMOSS BERRYoday's Date: 08/14/2015   History of Present Illness  Mr. Caleb Hays is a 75 year old male adm with   cellulitis; past medical history significant for gout, limb ischemia s/p amputations, COPD, Gout;   Clinical Impression  Pt admitted with above diagnosis. Pt currently with functional limitations due to the deficits listed below (see PT Problem List).   Pt will benefit from skilled PT to increase their independence and safety with mobility to allow discharge to the venue listed below.   Pt is significantly limited at this time d/t pain with dependent positioning of RLE; Recommend HHPT at this time, pt is fairly adamant about going home--no family present at time of PT eval; May need SNF if family unable to provide added support; will continue to follow during acute stay;     Follow Up Recommendations Home health PT (HHOT)    Equipment Recommendations  None recommended by PT    Recommendations for Other Services       Precautions / Restrictions Precautions Precautions: Fall Restrictions RLE Weight Bearing: Weight bearing as tolerated      Mobility  Bed Mobility Overal bed mobility: Needs Assistance Bed Mobility: Supine to Sit     Supine to sit: Supervision Sit to supine: Min assist   General bed mobility comments: incr time to come to sitting EOB, supervision for safety; min assist  to bring RLE back onto bed returning to supine; pt scoots to [facility] using UEs in  long sitting  with supervision  Transfers                 General transfer comment: pt reports being OOB with nursing staff earlier today and declines OOB again at this time  Ambulation/Gait                Stairs            Wheelchair Mobility    Modified Rankin (Stroke Patients Only)       Balance Overall balance assessment: Needs assistance Sitting-balance support: Bilateral upper  extremity supported;Feet unsupported Sitting balance-Leahy Scale: Fair Sitting balance - Comments: unable to tol sitting for more than a few seconds d/t pain       Standing balance comment: NT/pt refused d/t pain                             Pertinent Vitals/Pain Pain Assessment: 0-10    Home Living Family/patient expects to be discharged to:: Private residence Living Arrangements: Other relatives Available Help at Discharge: Family;Available 24 hours/day Type of Home: House Home Access: Stairs to enter Entrance Stairs-Rails: Doctor, general practice of Steps: 4 Home Layout: One level Home Equipment: Walker - 2 wheels;Shower seat Additional Comments: pt lives with niece and her husband; he just finished 6wks of  HHPT; pt reports he does not have room for a w/c    Prior Function     Gait / Transfers Assistance Needed: pt reports he hs ben amb with RW for ~2wks  ADL's / Homemaking Assistance Needed: pt states he was I although notes in chart say he needs assistance (?)        Hand Dominance        Extremity/Trunk Assessment   Upper Extremity Assessment: Defer to OT evaluation           Lower Extremity Assessment: Generalized weakness  Communication   Communication: [facility]  Cognition                            General Comments      Exercises        Assessment/Plan    PT Assessment Patient needs continued PT services  PT Diagnosis Difficulty walking;Generalized weakness;Acute pain   PT Problem List Decreased strength;Decreased range of motion;Decreased activity tolerance;Decreased balance;Decreased mobility;Decreased knowledge of use of DME;Decreased safety awareness;Decreased knowledge of precautions;Pain  PT Treatment Interventions DME instruction;Gait training;Functional mobility training;Therapeutic activities;Therapeutic exercise;Stair training;Balance training;Patient/family education;Wheelchair mobility  training   PT Goals (Current goals can be found in the Care Plan section) Acute Rehab PT Goals Patient Stated Goal: less pain PT Goal Formulation: With patient Time For Goal Achievement: 08/28/15 Potential to Achieve Goals: Fair    Frequency Min 3X/week   Barriers to discharge Decreased caregiver support pt reports his niece has been havign more difficulty caring for him at home    Co-evaluation               End of Session   Activity Tolerance: Patient limited by pain Patient left: in bed;with call bell/phone within reach           Time: 1143-1204 PT Time Calculation (min) (ACUTE ONLY): 21 min   Charges:   PT Evaluation $PT Eval Moderate Complexity: 1 Procedure     PT G Codes:        Caleb Hays September 12, 2015, 12:03 PM

## 2015-08-14 NOTE — Progress Notes (Signed)
Staff member brought patient's belongings that were left in recovery to his room.

## 2015-08-15 ENCOUNTER — Inpatient Hospital Stay (HOSPITAL_COMMUNITY): Payer: Medicare HMO

## 2015-08-15 MED ORDER — VANCOMYCIN HCL IN DEXTROSE 750-5 MG/150ML-% IV SOLN
750.0000 mg | Freq: Two times a day (BID) | INTRAVENOUS | Status: DC
  Administered 2015-08-15 – 2015-08-16 (×2): 750 mg via INTRAVENOUS
  Filled 2015-08-15 (×5): qty 150

## 2015-08-15 NOTE — Progress Notes (Signed)
[facility] PROGRESS NOTE  Caleb Hays ZOX:[id number] DOB: Aug 11, 1940 DOA: 08/13/2015 PCP: Egbert Garibaldi, NP  Assessment/Plan: 1. Cellulitis- patient is status post ankle arthroscopy, incision and drainage by Dr Lajoyce Corners. Continue vancomycin and Zosyn. Follow culture results. 2. COPD- stable, DuoNeb's when necessary 3. History of gout- last uric acid was 7.3, continue allopurinol. 4. ? Peripheral vascular disease- patient has surgical bandage in place, so ABI could not be performed. 5. Impacted cerumen left ear- patient will follow up with PCP as outpatient for removal of wax  from left ear.  Code Status: Full code Family Communication: *No family present at bedside Disposition Plan: Home when cleared by orthopedics   Consultants:  orthopedics  Procedures:  Ankle arthroscopy  Antibiotics:  Vancomycin    Zosyn  HPI/Subjective: 75 y.o. male with a history of nonhealing wounds due to peripheral vascular disease,with recent hospitalization from 08/11/2015 through 08/13/2015 for right lower extremity pain, suspicious for cellulitis after a right toenail was removed, discharged on doxycycline, now presenting on 08/13/2015 due to worsening of symptoms. He reports that the pain is constant, but the swelling has exacerbated. He cannot ambulate at this time due to the pain. He is now being admitted for incision and debridement of the right foot, and for the treatment of right lower extremity cellulitis. Of note, last Gram stain showed gram-positive cocci in pairs. A new culture will be drawn. He denies any worsening shortness of breath, chest pain, nausea vomiting or diarrhea. He denies any other areas of cellulitis.  Patient denies any pain. ABI ordered, could not be performed as patient has surgical bandage in place.  Objective: Filed Vitals:   08/14/15 2036 08/15/15 0523  BP: 142/73 130/66  Pulse: 64 84  Temp: 98.2 F (36.8 C) 97.7 F (36.5 C)  Resp: 18 18     Intake/Output Summary (Last 24 hours) at 08/15/15 1551 Last data filed at 08/15/15 1534  Gross per 24 hour  Intake 911.84 ml  Output   1000 ml  Net -88.16 ml   Filed Weights   08/13/15 2007  Weight: 79.1 kg (174 lb 6.1 oz)    Exam:   General:  Appears in no acute distress  Cardiology -S1-S2 regular  Respiratory: Clear bilaterally  AbdSoft, nontender, no organomegaly  Musculoskeletal- right lower extremity in dressing  HEENT- wax noted in the left ear canal  Data Reviewed: Basic Metabolic Panel:  Recent Labs Lab 08/09/15 2251 08/10/15 0820 08/11/15 0554 08/13/15 1232 08/14/15 0830  NA 140 139 137 138 140  K 4.6 4.5 4.4 4.2 4.5  CL 107 107 109 109 107  CO2 21* 21* GLUCOSE 109* 113* 91 133* 92  BUN 29* 25* CREATININE 1.81* 1.60* 1.39* 1.25* 1.22  CALCIUM 8.5* 8.2* 8.3* 8.5* 8.2*   Liver Function Tests:  Recent Labs Lab 08/13/15 1232  AST 14*  ALT 12*  ALKPHOS 77  BILITOT 0.5  PROT 5.7*  ALBUMIN 2.4*   CBC:  Recent Labs Lab 08/09/15 2251 08/10/15 0820 08/11/15 0554 08/13/15 1232 08/14/15 0830  WBC 9.0 8.1 7.7 12.3* 8.5  NEUTROABS 6.6  --  4.7 9.1*  --   HGB 9.2* 8.7* 8.3* 9.9* 8.7*  HCT 29.3* 27.5* 26.1* 31.4* 28.1*  MCV 74.9* 75.5* 75.0* 74.9* 76.4*  PLT 331 278 287 304 257   Cardiac Enzymes:  Recent Labs Lab 08/10/15 0820  CKTOTAL 21*   BNo results for input(s): PROBNP in the last 8760 hours.  CBG: No results for input(s): GLUCAP in the last 168 hours.  Recent Results (from the past 240 hour(s))  Wound culture     Status: None   Collection Time: 08/10/15  3:57 AM  Result Value Ref Range Status   Specimen Description WOUND RIGHT TOE  Final   Special Requests NONE  Final   Gram Stain   Final    NO WBC SEEN NO SQUAMOUS EPITHELIAL CELLS SEEN RARE GRAM POSITIVE COCCI IN PAIRS Performed at [facility]    Culture   Final    MODERATE STAPHYLOCOCCUS AUREUS Note: RIFAMPIN AND GENTAMICIN SHOULD  NOT BE USED AS SINGLE DRUGS FOR TREATMENT OF STAPH INFECTIONS. Performed at [facility]    Report Status 08/13/2015 FINAL  Final   Organism ID, Bacteria STAPHYLOCOCCUS AUREUS  Final      Susceptibility   Staphylococcus aureus - MIC*    CLINDAMYCIN <=0.25 SENSITIVE Sensitive     ERYTHROMYCIN <=0.25 SENSITIVE Sensitive     GENTAMICIN <=0.5 SENSITIVE Sensitive     LEVOFLOXACIN 0.25 SENSITIVE Sensitive     OXACILLIN 0.5 SENSITIVE Sensitive     RIFAMPIN <=0.5 SENSITIVE Sensitive     TRIMETH/SULFA <=10 SENSITIVE Sensitive     VANCOMYCIN 1 SENSITIVE Sensitive     TETRACYCLINE <=1 SENSITIVE Sensitive     MOXIFLOXACIN <=0.25 SENSITIVE Sensitive     * MODERATE STAPHYLOCOCCUS AUREUS  Culture, blood (routine x 2)     Status: None (Preliminary result)   Collection Time: 08/13/15  3:36 PM  Result Value Ref Range Status   Specimen Description BLOOD RIGHT FOREARM  Final   Special Requests BOTTLES DRAWN AEROBIC AND ANAEROBIC 5CC  Final   Culture NO GROWTH 2 DAYS  Final   Report Status PENDING  Incomplete  Culture, blood (routine x 2)     Status: None (Preliminary result)   Collection Time: 08/13/15  3:43 PM  Result Value Ref Range Status   Specimen Description BLOOD RIGHT ANTECUBITAL  Final   Special Requests BOTTLES DRAWN AEROBIC AND ANAEROBIC 5CC  Final   Culture NO GROWTH 2 DAYS  Final   Report Status PENDING  Incomplete  Anaerobic culture     Status: None (Preliminary result)   Collection Time: 08/13/15  6:27 PM  Result Value Ref Range Status   Specimen Description TISSUE RIGHT ANKLE  Final   Special Requests PATIENT ON FOLLOWING VANC  Final   Gram Stain   Final    RARE WBC PRESENT, PREDOMINANTLY PMN NO ORGANISMS SEEN Performed at [facility]    Culture   Final    NO ANAEROBES ISOLATED; CULTURE IN PROGRESS FOR 5 DAYS Performed at [facility]    Report Status PENDING  Incomplete  Tissue culture     Status: None (Preliminary result)   Collection Time:  08/13/15  6:27 PM  Result Value Ref Range Status   Specimen Description TISSUE RIGHT ANKLE  Final   Special Requests PATIENT ON FOLLOWING VANC  Final   Gram Stain   Final    RARE WBC PRESENT, PREDOMINANTLY PMN NO ORGANISMS SEEN Performed at [facility]    Culture   Final    MODERATE STAPHYLOCOCCUS AUREUS Note: RIFAMPIN AND GENTAMICIN SHOULD NOT BE USED AS SINGLE DRUGS FOR TREATMENT OF STAPH INFECTIONS. Performed at [facility]    Report Status PENDING  Incomplete  Culture, blood (single) w Reflex to ID Panel     Status: None (Preliminary result)   Collection Time: 08/13/15  9:13 PM  Result Value Ref Range Status   Specimen Description BLOOD RIGHT ARM  Final   Special Requests AEROBIC BOTTLE ONLY  Final   Culture NO GROWTH 1 DAY  Final   Report Status PENDING  Incomplete     Studies: No results found.  Scheduled Meds: . albuterol  3 mL Inhalation BID  . colchicine  0.6 mg Oral BID  . docusate sodium  100 mg Oral BID  . piperacillin-tazobactam (ZOSYN)  IV  3.375 g Intravenous 3 times per day  . senna  1 tablet Oral BID  . vancomycin  750 mg Intravenous Q12H   Continuous Infusions: . sodium chloride Stopped (08/14/15 1743)    Active Problems:   COPD (chronic obstructive pulmonary disease) (HCC)   Cellulitis   Cellulitis of right foot   Anemia of chronic disease   Ankle pain   Cellulitis of right lower extremity    Time spent: 25 min    [facility]  [facility] Pager [phone]*. If 7PM-7AM, please contact night-coverage at [email or web address], password [facility] 08/15/2015, 3:51 PM  LOS: 2 days

## 2015-08-15 NOTE — Progress Notes (Signed)
Physical Therapy Treatment Patient Details Name: Caleb Hays MRN: [id number] DOB: 10-14-1940 Today's Date: 08/15/2015    History of Present Illness Mr. Mcbain is a 75 year old male with a past medical history significant for gout, limb ischemia s/p amputations, COPD, Gout; who presents with redness and swelling of his right foot going up into his ankle.     PT Comments    Pt is making progress toward mobility goals. Pt reported that his niece is home during the day and does not work and that she provides assistance when he needs it. Patient needs to practice stairs next session.     Follow Up Recommendations  Home health PT (HHOT, SW)     Equipment Recommendations  Rolling walker with 5" wheels;3in1 (PT) (pt and neice declining wheelchair)    Recommendations for Other Services       Precautions / Restrictions Precautions Precautions: Fall Restrictions Weight Bearing Restrictions: Yes RLE Weight Bearing: Weight bearing as tolerated    Mobility  Bed Mobility Overal bed mobility: Needs Assistance Bed Mobility: Supine to Sit     Supine to sit: Supervision     General bed mobility comments: supervision for safety; min use of bed rail  Transfers Overall transfer level: Needs assistance Equipment used: Rolling walker (2 wheeled) Transfers: Sit to/from Stand Sit to Stand: Min guard         General transfer comment: min guard for safety; no physical assist needed for power up from EOB; vc for safe hand placement and position of RW  Ambulation/Gait Ambulation/Gait assistance: Min guard Ambulation Distance (Feet): 20 Feet Assistive device: Rolling walker (2 wheeled) Gait Pattern/deviations: Step-to pattern;Antalgic;Trunk flexed   Gait velocity interpretation: Below normal speed for age/gender General Gait Details: vc for position of RW and for posture; pt able to WB on R heel and remains guarded; no significant LOB    Stairs         General stair  comments: pt declined any further ambulation due to fear of increasing pain  Wheelchair Mobility    Modified Rankin (Stroke Patients Only)       Balance Overall balance assessment: Needs assistance Sitting-balance support: Feet supported Sitting balance-Leahy Scale: Fair     Standing balance support: Bilateral upper extremity supported Standing balance-Leahy Scale: Poor                      Cognition Arousal/Alertness: Awake/alert Behavior During Therapy: WFL for tasks assessed/performed Overall Cognitive Status: Within Functional Limits for tasks assessed                      Exercises      General Comments General comments (skin integrity, edema, etc.): pt reported that his niece is at home during the day and does not work and is able to assist him as needed      Pertinent Vitals/Pain Pain Assessment: 0-10 Pain Score: 7  Pain Location: R ankle Pain Descriptors / Indicators: Sore Pain Intervention(s): Limited activity within patient's tolerance;Premedicated before session;Monitored during session;Repositioned    Home Living                      Prior Function            PT Goals (current goals can now be found in the care plan section) Acute Rehab PT Goals Patient Stated Goal: to get better    Frequency  Min 3X/week    PT Plan Discharge plan needs  to be updated    Co-evaluation             End of Session Equipment Utilized During Treatment: Gait belt Activity Tolerance: Patient tolerated treatment well Patient left: in chair;with call bell/phone within reach     Time: 1020-1040 PT Time Calculation (min) (ACUTE ONLY): 20 min  Charges:  $Gait Training: 8-22 mins                    G Codes:      Derek Mound, PTA Pager: [phone]   08/15/2015, 11:15 AM

## 2015-08-15 NOTE — Progress Notes (Signed)
Pharmacy Antibiotic Follow-up Note  Caleb Hays is a 75 y.o. year-old male admitted on 08/13/2015.  The patient is currently on day 2 of Vancomycin and Zosyn for right foot cellulitis.  Assessment S/p ankle arthroscopy on 1/25. 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 CrCl has improved to 60 mL/min. WBC wnl, Afebrile.   Plan: Increase vancomycin to 750 mg IV Q 12 hours Zosyn 3.375g IV q8h Measure antibiotic drug levels at steady state Follow up culture results, abx tx, clinical course  Temp (24hrs), Avg:98 F (36.7 C), Min:97.7 F (36.5 C), Max:98.2 F (36.8 C)   Recent Labs Lab 08/09/15 2251 08/10/15 0820 08/11/15 0554 08/13/15 1232 08/14/15 0830  WBC 9.0 8.1 7.7 12.3* 8.5    Recent Labs Lab 08/09/15 2251 08/10/15 0820 08/11/15 0554 08/13/15 1232 08/14/15 0830  CREATININE 1.81* 1.60* 1.39* 1.25* 1.22   Estimated Creatinine Clearance: 60.3 mL/min (by C-G formula based on Cr of 1.22).    No Known Allergies  Antimicrobials this admission: 1/25 Vanc>> 1/25 Zosyn>>  Levels/dose changes this admission: None   Microbiology results: 1/25 soft tissue R ankle>ngtd 1/25 BCx2>>ngtdngtd  1/22 R toe wound>> MSSA  Thank you for allowing pharmacy to be a part of this patient's care.  Vinnie Level, PharmD., BCPS Clinical Pharmacist Phone [phone]

## 2015-08-15 NOTE — Progress Notes (Signed)
Patient has ABI study ordered.  Still has surgical coban bandage in place.  Technician unable to perform test with bandage in place. Attempted to contact Dr. Lajoyce Corners via office and cell phone with no response.  Will follow up with vascular after MD has seen patient and assessed need for surgical bandage.

## 2015-08-15 NOTE — Care Management Important Message (Signed)
Important Message  Patient Details  Name: Caleb Hays MRN: [id number] Date of Birth: 1940-08-18   Medicare Important Message Given:  Yes    Oralia Rud Karmello Abercrombie 08/15/2015, 4:34 PM

## 2015-08-16 LAB — BASIC METABOLIC PANEL
ANION GAP: 7 (ref 5–15)
BUN: 8 mg/dL (ref 6–20)
CALCIUM: 8.4 mg/dL — AB (ref 8.9–10.3)
CO2: 25 mmol/L (ref 22–32)
CREATININE: 1.22 mg/dL (ref 0.61–1.24)
Chloride: 106 mmol/L (ref 101–111)
GFR calc non Af Amer: 57 mL/min — ABNORMAL LOW (ref >60)
GLUCOSE: 85 mg/dL (ref 65–99)
Potassium: 4 mmol/L (ref 3.5–5.1)
Sodium: 138 mmol/L (ref 135–145)

## 2015-08-16 LAB — TISSUE CULTURE

## 2015-08-16 MED ORDER — SENNA 8.6 MG PO TABS: 1.0000 | ORAL_TABLET | Freq: Two times a day (BID) | ORAL | Status: DC

## 2015-08-16 MED ORDER — DOCUSATE SODIUM 100 MG PO CAPS: 100.0000 mg | ORAL_CAPSULE | Freq: Two times a day (BID) | ORAL | Status: DC

## 2015-08-16 MED ORDER — METHOCARBAMOL 500 MG PO TABS: 500.0000 mg | ORAL_TABLET | Freq: Four times a day (QID) | ORAL | Status: DC | PRN

## 2015-08-16 MED ORDER — OXYCODONE-ACETAMINOPHEN 5-325 MG PO TABS: 1.0000 | ORAL_TABLET | Freq: Four times a day (QID) | ORAL | Status: DC | PRN

## 2015-08-16 MED ORDER — DOXYCYCLINE HYCLATE 100 MG PO TABS: 100.0000 mg | ORAL_TABLET | Freq: Two times a day (BID) | ORAL | Status: AC

## 2015-08-16 MED ORDER — OXYCODONE-ACETAMINOPHEN 5-325 MG PO TABS
1.0000 | ORAL_TABLET | Freq: Four times a day (QID) | ORAL | Status: DC | PRN
  Administered 2015-08-16: 2 via ORAL
  Filled 2015-08-16: qty 2

## 2015-08-16 NOTE — Discharge Summary (Addendum)
Physician Discharge Summary  Caleb Hays ZOX:[id number] DOB: 1940-10-16 DOA: 08/13/2015  PCP: Egbert Garibaldi, NP  Admit date: 08/13/2015 Discharge date: 08/16/2015  Time spent: 25 minutes  Recommendations for Outpatient Follow-up:  Follow up Dr Lajoyce Corners in 2 weeks Will need ABI as outpatient once the right lower extremity wound is healed.  Discharge Diagnoses:  Active Problems:   COPD (chronic obstructive pulmonary disease) (HCC)   Cellulitis   Cellulitis of right foot   Anemia of chronic disease   Ankle pain   Cellulitis of right lower extremity   Discharge Condition: Stable  Diet recommendation: Low salt diet  Filed Weights   08/13/15 2007  Weight: 79.1 kg (174 lb 6.1 oz)    History of present illness:  75 y.o. male with a history of nonhealing wounds due to peripheral vascular disease,with recent hospitalization from 08/11/2015 through 08/13/2015 for right lower extremity pain, suspicious for cellulitis after a right toenail was removed, discharged on doxycycline, now presenting on 08/13/2015 due to worsening of symptoms. He reports that the pain is constant, but the swelling has exacerbated. He cannot ambulate at this time due to the pain. He is now being admitted for incision and debridement of the right foot, and for the treatment of right lower extremity cellulitis. Of note, last Gram stain showed gram-positive cocci in pairs. A new culture will be drawn. He denies any worsening shortness of breath, chest pain, nausea vomiting or diarrhea. He denies any other areas of cellulitis.   Hospital Course:  1. Cellulitis- patient is status post ankle arthroscopy, incision and drainage by Dr Lajoyce Corners. Patient was started on vancomycin and Zosyn. The cultures came back and shows MSSA, sensitive to doxycycline. Will send the patient home on doxycycline 100 mg twice a day for 7 more days. Patient was seen by Dr. Ophelia Charter as a follow-up today. He will follow-up with Dr. Lajoyce Corners in 2  weeks. 2. Gout- continue  Colchicine. We'll send home on Percocet tablet 5/325 one tablet every 6 hours when necessary for pain 3. COPD- stable, DuoNeb's when necessary 4. Impacted cerumen left ear- patient will follow up with PCP as outpatient for removal of wax from left ear. 5. Anemia of chronic disease- patient has history of anemia of chronic disease, willing to follow-up with PCP for further workup. Today hemoglobin is 8.7. 6. Peripheral vascular disease- patient has history of referral vascular disease. ABI of the right lower extremity were ordered twice in the hospital they could not perform it due to patient's right lower extremity swelling and wound with surgical dressing in place. He will need to get it done as outpatient.   Procedures:  Ankle arthroscopy  Consultations:  Orthopedic surgery  Discharge Exam: Filed Vitals:   08/15/15 2058 08/16/15 0515  BP: 145/68 125/69  Pulse: 72 66  Temp: 98.1 F (36.7 C) 97.5 F (36.4 C)  Resp: 18 18    General: Appears in no acute distress Cardiovascular: S1-S2 regular Respiratory: Clear to auscultation bilaterally  Discharge Instructions   Discharge Instructions    Diet - low sodium heart healthy    Complete by:  As directed      Increase activity slowly    Complete by:  As directed           Current Discharge Medication List    START taking these medications   Details  docusate sodium (COLACE) 100 MG capsule Take 1 capsule (100 mg total) by mouth 2 (two) times daily. Qty: 10 capsule, Refills: 0  methocarbamol (ROBAXIN) 500 MG tablet Take 1 tablet (500 mg total) by mouth every 6 (six) hours as needed for muscle spasms. Qty: 30 tablet, Refills: 0    oxyCODONE-acetaminophen (PERCOCET/ROXICET) 5-325 MG tablet Take 1 tablet by mouth every 6 (six) hours as needed for moderate pain. Qty: 30 tablet, Refills: 0    senna (SENOKOT) 8.6 MG TABS tablet Take 1 tablet (8.6 mg total) by mouth 2 (two) times daily. Qty: 30  each, Refills: 0      CONTINUE these medications which have CHANGED   Details  doxycycline (VIBRA-TABS) 100 MG tablet Take 1 tablet (100 mg total) by mouth 2 (two) times daily. Qty: 14 tablet, Refills: 0      CONTINUE these medications which have NOT CHANGED   Details  albuterol (PROVENTIL HFA;VENTOLIN HFA) 108 (90 BASE) MCG/ACT inhaler Inhale 2 puffs into the lungs 2 (two) times daily. Take every daily per niece    colchicine 0.6 MG tablet Take 1 tablet (0.6 mg total) by mouth 2 (two) times daily. Qty: 14 tablet, Refills: 0    saccharomyces boulardii (FLORASTOR) 250 MG capsule Take 1 capsule (250 mg total) by mouth 2 (two) times daily. Qty: 14 capsule, Refills: 0    traZODone (DESYREL) 50 MG tablet Take 0.5 tablets (25 mg total) by mouth at bedtime as needed for sleep. Qty: 30 tablet, Refills: 0      STOP taking these medications     HYDROcodone-acetaminophen (NORCO/VICODIN) 5-325 MG tablet        No Known Allergies Follow-up Information    Follow up with DUDA,MARCUS V, MD In 2 weeks.   Specialty:  Orthopedic Surgery   Contact information:   [address] [place] [address] [phone]        The results of significant diagnostics from this hospitalization (including imaging, microbiology, ancillary and laboratory) are listed below for reference.    Significant Diagnostic Studies: Dg Foot Complete Right  08/25/2015  CLINICAL DATA:  75 year old male with foot pain EXAM: RIGHT FOOT COMPLETE - 3+ VIEW COMPARISON:  Radiograph dated 08/05/2015 FINDINGS: Stable postsurgical changes of the fourth and transmetatarsal amputation. There is degenerative changes of the tarsometatarsal joints. The bones are osteopenic. No acute fracture. The soft tissues are unremarkable. IMPRESSION: No acute fracture.  No interval change. Electronically Signed   By: Elgie Collard M.D.   On: 25-Aug-2015 23:42   Dg Foot Complete Right  08/05/2015  CLINICAL DATA:  Wound on the great  toe.  Redness, tenderness. EXAM: RIGHT FOOT COMPLETE - 3+ VIEW COMPARISON:  None. 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 Degenerative changes at the first MTP joint and first metatarsal joints. No acute bony abnormality. Specifically, no fracture, subluxation, or dislocation. Soft tissues are intact. No radiographic changes of osteomyelitis. IMPRESSION: No acute bony abnormality. Electronically Signed   By: Charlett Nose M.D.   On: 08/05/2015 21:17    Microbiology: Recent Results (from the past 240 hour(s))  Wound culture     Status: None   Collection Time: 08/10/15  3:57 AM  Result Value Ref Range Status   Specimen Description WOUND RIGHT TOE  Final   Special Requests NONE  Final   Gram Stain   Final    NO WBC SEEN NO SQUAMOUS EPITHELIAL CELLS SEEN RARE GRAM POSITIVE COCCI IN PAIRS Performed at [facility]    Culture   Final    MODERATE STAPHYLOCOCCUS AUREUS Note: RIFAMPIN AND GENTAMICIN SHOULD NOT BE USED AS SINGLE  DRUGS FOR TREATMENT OF STAPH INFECTIONS. Performed at [facility]    Report Status 08/13/2015 FINAL  Final   Organism ID, Bacteria STAPHYLOCOCCUS AUREUS  Final      Susceptibility   Staphylococcus aureus - MIC*    CLINDAMYCIN <=0.25 SENSITIVE Sensitive     ERYTHROMYCIN <=0.25 SENSITIVE Sensitive     GENTAMICIN <=0.5 SENSITIVE Sensitive     LEVOFLOXACIN 0.25 SENSITIVE Sensitive     OXACILLIN 0.5 SENSITIVE Sensitive     RIFAMPIN <=0.5 SENSITIVE Sensitive     TRIMETH/SULFA <=10 SENSITIVE Sensitive     VANCOMYCIN 1 SENSITIVE Sensitive     TETRACYCLINE <=1 SENSITIVE Sensitive     MOXIFLOXACIN <=0.25 SENSITIVE Sensitive     * MODERATE STAPHYLOCOCCUS AUREUS  Culture, blood (routine x 2)     Status: None (Preliminary result)   Collection Time: 08/13/15  3:36 PM  Result Value Ref Range Status   Specimen Description BLOOD RIGHT FOREARM  Final   Special Requests BOTTLES DRAWN AEROBIC AND ANAEROBIC 5CC  Final   Culture NO  GROWTH 2 DAYS  Final   Report Status PENDING  Incomplete  Culture, blood (routine x 2)     Status: None (Preliminary result)   Collection Time: 08/13/15  3:43 PM  Result Value Ref Range Status   Specimen Description BLOOD RIGHT ANTECUBITAL  Final   Special Requests BOTTLES DRAWN AEROBIC AND ANAEROBIC 5CC  Final   Culture NO GROWTH 2 DAYS  Final   Report Status PENDING  Incomplete  Anaerobic culture     Status: None (Preliminary result)   Collection Time: 08/13/15  6:27 PM  Result Value Ref Range Status   Specimen Description TISSUE RIGHT ANKLE  Final   Special Requests PATIENT ON FOLLOWING VANC  Final   Gram Stain   Final    RARE WBC PRESENT, PREDOMINANTLY PMN NO ORGANISMS SEEN Performed at [facility]    Culture   Final    NO ANAEROBES ISOLATED; CULTURE IN PROGRESS FOR 5 DAYS Performed at [facility]    Report Status PENDING  Incomplete  Tissue culture     Status: None   Collection Time: 08/13/15  6:27 PM  Result Value Ref Range Status   Specimen Description TISSUE RIGHT ANKLE  Final   Special Requests PATIENT ON FOLLOWING VANC  Final   Gram Stain   Final    RARE WBC PRESENT, PREDOMINANTLY PMN NO ORGANISMS SEEN Performed at [facility]    Culture   Final    MODERATE STAPHYLOCOCCUS AUREUS Note: RIFAMPIN AND GENTAMICIN SHOULD NOT BE USED AS SINGLE DRUGS FOR TREATMENT OF STAPH INFECTIONS. Performed at [facility]    Report Status 08/16/2015 FINAL  Final   Organism ID, Bacteria STAPHYLOCOCCUS AUREUS  Final      Susceptibility   Staphylococcus aureus - MIC*    CLINDAMYCIN <=0.25 SENSITIVE Sensitive     ERYTHROMYCIN <=0.25 SENSITIVE Sensitive     GENTAMICIN <=0.5 SENSITIVE Sensitive     LEVOFLOXACIN 0.25 SENSITIVE Sensitive     OXACILLIN <=0.25 SENSITIVE Sensitive     RIFAMPIN <=0.5 SENSITIVE Sensitive     TRIMETH/SULFA <=10 SENSITIVE Sensitive     VANCOMYCIN 1 SENSITIVE Sensitive     TETRACYCLINE <=1 SENSITIVE Sensitive      MOXIFLOXACIN <=0.25 SENSITIVE Sensitive     * MODERATE STAPHYLOCOCCUS AUREUS  Culture, blood (single) w Reflex to ID Panel     Status: None (Preliminary result)   Collection Time: 08/13/15  9:13 PM  Result Value Ref Range Status   Specimen Description BLOOD RIGHT ARM  Final   Special Requests AEROBIC BOTTLE ONLY  Final   Culture NO GROWTH 1 DAY  Final   Report Status PENDING  Incomplete     Labs: Basic Metabolic Panel:  Recent Labs Lab 08/10/15 0820 08/11/15 0554 08/13/15 1232 08/14/15 0830 08/16/15 0349  NA 139 137 138 140 138  K 4.5 4.4 4.2 4.5 4.0  CL 107 109 109 107 106  CO2 21* 22 23 24 25   GLUCOSE 113* 91 133* 92 85  BUN 25* 19 11 12 8   CREATININE 1.60* 1.39* 1.25* 1.22 1.22  CALCIUM 8.2* 8.3* 8.5* 8.2* 8.4*   Liver Function Tests:  Recent Labs Lab 08/13/15 1232  AST 14*  ALT 12*  ALKPHOS 77  BILITOT 0.5  PROT 5.7*  ALBUMIN 2.4*   No results for input(s): LIPASE, AMYLASE in the last 168 hours. No results for input(s): AMMONIA in the last 168 hours. CBC:  Recent Labs Lab 08/09/15 2251 08/10/15 0820 08/11/15 0554 08/13/15 1232 08/14/15 0830  WBC 9.0 8.1 7.7 12.3* 8.5  NEUTROABS 6.6  --  4.7 9.1*  --   HGB 9.2* 8.7* 8.3* 9.9* 8.7*  HCT 29.3* 27.5* 26.1* 31.4* 28.1*  MCV 74.9* 75.5* 75.0* 74.9* 76.4*  PLT 331 278 287 304 257   Cardiac Enzymes:  Recent Labs Lab 08/10/15 0820  CKTOTAL 21*       Signed:  Araina Butrick S MD.  [facility] 08/16/2015, 10:09 AM

## 2015-08-16 NOTE — Progress Notes (Addendum)
Subjective: 3 Days Post-Op Procedure(s) (LRB): ANKLE ARTHROSCOPY (Right) Patient reports pain as moderate.  Pain in right ankle with weight bearing.  Objective: Vital signs in last 24 hours: Temp:  [97.5 F (36.4 C)-98.1 F (36.7 C)] 97.5 F (36.4 C) (01/28 0515) Pulse Rate:  [66-82] 66 (01/28 0515) Resp:  [18] 18 (01/28 0515) BP: (115-145)/(68-69) 125/69 mmHg (01/28 0515) SpO2:  [92 %-99 %] 96 % (01/28 0743)  Intake/Output from previous day: 01/27 0701 - 01/28 0700 In: 1780 [P.O.:1280; IV Piggyback:500] Out: 1700 [Urine:1700] Intake/Output this shift:     Recent Labs  08/13/15 1232 08/14/15 0830  HGB 9.9* 8.7*    Recent Labs  08/13/15 1232 08/14/15 0830  WBC 12.3* 8.5  RBC 4.19* 3.68*  HCT 31.4* 28.1*  PLT 304 257    Recent Labs  08/14/15 0830 08/16/15 0349  NA 140 138  K 4.5 4.0  CL 107 106  CO2 24 25  BUN 12 8  CREATININE 1.22 1.22  GLUCOSE 92 85  CALCIUM 8.2* 8.4*    Recent Labs  08/13/15 2113  INR 1.27    cultures back today for staph, multiple ABX work mild ankle swelling Dressing removed . Swelling and erythema of ankle improved.  Assessment/Plan: 3 Days Post-Op Procedure(s) (LRB): ANKLE ARTHROSCOPY (Right) Gout with secondary MSSA infection Home, cover for gout and po ABX  ROV Duda next week Ihsan Nomura C 08/16/2015, 9:42 AM

## 2015-08-16 NOTE — Progress Notes (Addendum)
LCSW called by RN that patient needs ambulance home as he is able to weight bear on foot as tolerated. LCSW explained medical necessity and that patient is liable for ambulance bill due to ability to sit in chair. Patient aware and verbalizes still wanting ambulance home.    LCSW to arrange for transport for patient.  Deretha Emory, MSW Clinical Social Work: Emergency Room [phone]

## 2015-08-16 NOTE — Progress Notes (Addendum)
LCSW spoke with family again regarding planned DC, 2 PT recommendations and need for [facility] PT per notes.  Family adament reporting they will not take him home.  LCSW explained process to which pt's niece's husband became verball aggressive threatening a lawyer and cursing LCSW out via phone. LCSW attempted service recovery explaining private pay option in which family hung up on LCSW. Patient is scheduled for DC and to be discharge to family home as there are no other options. Patient does not qualify for SNF at this time.  Family not willing to pay out of pocket. Home Health is already in place and if patient needed to be placed from home then [facility] can accomodate this request, however being patient's progress and ability, HH continues to be the recommendation per notes.   LCSW made aware that patient requires ambulance home. Patient made aware of medical necessity for ambulance ride and liability of patient paying for transport. Patient aware and agreeable. Confirmed home address and called family to make sure patient will have someone to let him in when ambulance arrives.  Patient has HH: [facility] already in place. No other needs. Arranged PTAR for transport.  Deretha Emory, MSW Clinical Social Work: Emergency Room [phone]

## 2015-08-16 NOTE — Progress Notes (Signed)
ABI order received and study attempted 08/14/14 , however could not be completed due to surgical dressings. Please reorder when bandages can be removed.   08/16/2015 9:13 AM Gertie Fey, RVT, RDCS, RDMS

## 2015-08-18 LAB — CULTURE, BLOOD (ROUTINE X 2)
CULTURE: NO GROWTH
CULTURE: NO GROWTH

## 2015-08-18 LAB — ANAEROBIC CULTURE

## 2015-08-19 LAB — CULTURE, BLOOD (SINGLE): Culture: NO GROWTH

## 2015-09-29 ENCOUNTER — Other Ambulatory Visit: Payer: Self-pay | Admitting: Orthopedic Surgery

## 2015-09-30 ENCOUNTER — Encounter (HOSPITAL_COMMUNITY): Payer: Self-pay | Admitting: *Deleted

## 2015-09-30 MED ORDER — CEFAZOLIN SODIUM-DEXTROSE 2-3 GM-% IV SOLR
2.0000 g | INTRAVENOUS | Status: AC
  Administered 2015-10-01: 2 g via INTRAVENOUS
  Filled 2015-09-30: qty 50

## 2015-09-30 NOTE — Pre-Procedure Instructions (Signed)
    Barton DuboisJerald J Pangborn  09/30/2015      Your procedure is scheduled on Wednesday, March 15.   [facility]    Call this number if you have problems the morning of surgery:[phone]   Remember:  Do not eat food or drink liquids after midnight.  Take these medicines the morning of surgery with A SIP OF WATER : Florastor.  Take if needed and tolerates on an empty stomach: Oxycodone- Acetaminophen               If Needed: Albuterol Inhaler.   Do not wear jewelry, make-up or nail polish.  Do not wear lotions, powders, or perfumes.               Men may shave face and neck.  Do not bring valuables to the hospital.  [facility] not [facility] any belongings or valuables.

## 2015-09-30 NOTE — Progress Notes (Signed)
I spoke with Mr Reesor's niece, Daun PeacockLauren [name] and she informed me that [facility] is now at [facility].  I called [facility] and spoke with DON, Phillinda Smith regarding patient's surgery tomorrow.

## 2015-10-01 ENCOUNTER — Encounter (HOSPITAL_COMMUNITY)
Admission: AD | Disposition: A | Discharge status: Skilled Nursing Facility | Payer: Self-pay | Source: Ambulatory Visit | Attending: Orthopedic Surgery

## 2015-10-01 ENCOUNTER — Encounter (HOSPITAL_COMMUNITY): Payer: Self-pay | Admitting: Certified Registered"

## 2015-10-01 ENCOUNTER — Inpatient Hospital Stay (HOSPITAL_COMMUNITY): Payer: Medicare HMO | Admitting: Anesthesiology

## 2015-10-01 ENCOUNTER — Inpatient Hospital Stay (HOSPITAL_COMMUNITY)
Admission: AD | Admit: 2015-10-01 | Discharge: 2015-10-03 | DRG: 476 | Disposition: A | Discharge status: Skilled Nursing Facility | Payer: Medicare HMO | Source: Ambulatory Visit | Attending: Orthopedic Surgery | Admitting: Orthopedic Surgery

## 2015-10-01 DIAGNOSIS — Z961 Presence of intraocular lens: Secondary | ICD-10-CM | POA: Diagnosis present

## 2015-10-01 DIAGNOSIS — M1990 Unspecified osteoarthritis, unspecified site: Secondary | ICD-10-CM | POA: Diagnosis present

## 2015-10-01 DIAGNOSIS — J449 Chronic obstructive pulmonary disease, unspecified: Secondary | ICD-10-CM | POA: Diagnosis present

## 2015-10-01 DIAGNOSIS — F17210 Nicotine dependence, cigarettes, uncomplicated: Secondary | ICD-10-CM | POA: Diagnosis present

## 2015-10-01 DIAGNOSIS — M25571 Pain in right ankle and joints of right foot: Secondary | ICD-10-CM | POA: Diagnosis present

## 2015-10-01 DIAGNOSIS — I739 Peripheral vascular disease, unspecified: Secondary | ICD-10-CM | POA: Diagnosis present

## 2015-10-01 DIAGNOSIS — M86671 Other chronic osteomyelitis, right ankle and foot: Secondary | ICD-10-CM | POA: Diagnosis present

## 2015-10-01 DIAGNOSIS — Z9842 Cataract extraction status, left eye: Secondary | ICD-10-CM | POA: Diagnosis not present

## 2015-10-01 DIAGNOSIS — Z9841 Cataract extraction status, right eye: Secondary | ICD-10-CM | POA: Diagnosis not present

## 2015-10-01 DIAGNOSIS — IMO0002 Reserved for concepts with insufficient information to code with codable children: Secondary | ICD-10-CM

## 2015-10-01 HISTORY — PX: AMPUTATION: SHX166

## 2015-10-01 LAB — CBC
HEMATOCRIT: 29.3 % — AB (ref 39.0–52.0)
Hemoglobin: 9.3 g/dL — ABNORMAL LOW (ref 13.0–17.0)
MCH: 23.6 pg — ABNORMAL LOW (ref 26.0–34.0)
MCHC: 31.7 g/dL (ref 30.0–36.0)
MCV: 74.4 fL — AB (ref 78.0–100.0)
PLATELETS: 199 10*3/uL (ref 150–400)
RBC: 3.94 MIL/uL — AB (ref 4.22–5.81)
RDW: 17.1 % — AB (ref 11.5–15.5)
WBC: 17.6 10*3/uL — AB (ref 4.0–10.5)

## 2015-10-01 LAB — BASIC METABOLIC PANEL
BUN: 22 mg/dL — AB (ref 4–21)
Creatinine: 1.3 mg/dL (ref 0.6–1.3)
Glucose: 102 mg/dL
Sodium: 143 mmol/L (ref 137–147)

## 2015-10-01 LAB — COMPREHENSIVE METABOLIC PANEL
ALBUMIN: 2.5 g/dL — AB (ref 3.5–5.0)
ALT: 15 U/L — AB (ref 17–63)
AST: 17 U/L (ref 15–41)
Alkaline Phosphatase: 113 U/L (ref 38–126)
Anion gap: 13 (ref 5–15)
BILIRUBIN TOTAL: 0.7 mg/dL (ref 0.3–1.2)
BUN: 22 mg/dL — AB (ref 6–20)
CHLORIDE: 108 mmol/L (ref 101–111)
CO2: 22 mmol/L (ref 22–32)
CREATININE: 1.26 mg/dL — AB (ref 0.61–1.24)
Calcium: 8.4 mg/dL — ABNORMAL LOW (ref 8.9–10.3)
GFR calc Af Amer: >60 mL/min (ref >60)
GFR, EST NON AFRICAN AMERICAN: 55 mL/min — AB (ref >60)
GLUCOSE: 102 mg/dL — AB (ref 65–99)
POTASSIUM: 3.6 mmol/L (ref 3.5–5.1)
Sodium: 143 mmol/L (ref 135–145)
Total Protein: 5.8 g/dL — ABNORMAL LOW (ref 6.5–8.1)

## 2015-10-01 LAB — HEPATIC FUNCTION PANEL: Bilirubin, Total: 0.7 mg/dL

## 2015-10-01 LAB — APTT: APTT: 42 s — AB (ref 24–37)

## 2015-10-01 LAB — PROTIME-INR
INR: 1.26 (ref 0.00–1.49)
Prothrombin Time: 15.9 seconds — ABNORMAL HIGH (ref 11.6–15.2)

## 2015-10-01 SURGERY — AMPUTATION BELOW KNEE
Anesthesia: General | Laterality: Right

## 2015-10-01 MED ORDER — NUTRA SHAKE (FROZEN) PO LIQD: Freq: Three times a day (TID) | ORAL | Status: DC

## 2015-10-01 MED ORDER — KETOROLAC TROMETHAMINE 15 MG/ML IJ SOLN
7.5000 mg | Freq: Four times a day (QID) | INTRAMUSCULAR | Status: AC
  Administered 2015-10-01 – 2015-10-02 (×4): 7.5 mg via INTRAVENOUS
  Filled 2015-10-01 (×3): qty 1

## 2015-10-01 MED ORDER — LIDOCAINE HCL (CARDIAC) 20 MG/ML IV SOLN
INTRAVENOUS | Status: DC | PRN
  Administered 2015-10-01: 100 mg via INTRAVENOUS

## 2015-10-01 MED ORDER — HYDROMORPHONE HCL 1 MG/ML IJ SOLN
0.2500 mg | INTRAMUSCULAR | Status: DC | PRN
  Administered 2015-10-01 (×3): 0.5 mg via INTRAVENOUS

## 2015-10-01 MED ORDER — METHOCARBAMOL 1000 MG/10ML IJ SOLN
500.0000 mg | INTRAVENOUS | Status: DC
  Administered 2015-10-01: 500 mg via INTRAVENOUS
  Filled 2015-10-01: qty 5

## 2015-10-01 MED ORDER — HYDROMORPHONE HCL 1 MG/ML IJ SOLN
1.0000 mg | INTRAMUSCULAR | Status: DC | PRN
Start: 2015-10-01 — End: 2015-10-03
  Administered 2015-10-01 – 2015-10-03 (×3): 1 mg via INTRAVENOUS
  Filled 2015-10-01 (×3): qty 1

## 2015-10-01 MED ORDER — ONDANSETRON HCL 4 MG/2ML IJ SOLN
INTRAMUSCULAR | Status: AC
  Filled 2015-10-01: qty 2

## 2015-10-01 MED ORDER — FENTANYL CITRATE (PF) 250 MCG/5ML IJ SOLN
INTRAMUSCULAR | Status: AC
  Filled 2015-10-01: qty 5

## 2015-10-01 MED ORDER — SENNA 8.6 MG PO TABS
1.0000 | ORAL_TABLET | Freq: Two times a day (BID) | ORAL | Status: DC
  Administered 2015-10-02: 8.6 mg via ORAL
  Filled 2015-10-01 (×3): qty 1

## 2015-10-01 MED ORDER — METOCLOPRAMIDE HCL 5 MG PO TABS: 5.0000 mg | ORAL_TABLET | Freq: Three times a day (TID) | ORAL | Status: DC | PRN

## 2015-10-01 MED ORDER — ASPIRIN EC 325 MG PO TBEC
325.0000 mg | DELAYED_RELEASE_TABLET | Freq: Every day | ORAL | Status: DC
  Administered 2015-10-01 – 2015-10-03 (×3): 325 mg via ORAL
  Filled 2015-10-01 (×3): qty 1

## 2015-10-01 MED ORDER — ONDANSETRON HCL 4 MG/2ML IJ SOLN: 4.0000 mg | Freq: Four times a day (QID) | INTRAMUSCULAR | Status: DC | PRN

## 2015-10-01 MED ORDER — ONDANSETRON HCL 4 MG PO TABS: 4.0000 mg | ORAL_TABLET | Freq: Four times a day (QID) | ORAL | Status: DC | PRN

## 2015-10-01 MED ORDER — KETOROLAC TROMETHAMINE 15 MG/ML IJ SOLN
INTRAMUSCULAR | Status: AC
  Filled 2015-10-01: qty 1

## 2015-10-01 MED ORDER — FENTANYL CITRATE (PF) 100 MCG/2ML IJ SOLN
INTRAMUSCULAR | Status: DC | PRN
  Administered 2015-10-01 (×2): 50 ug via INTRAVENOUS

## 2015-10-01 MED ORDER — OXYCODONE HCL 5 MG PO TABS
5.0000 mg | ORAL_TABLET | ORAL | Status: DC | PRN
  Administered 2015-10-01 – 2015-10-03 (×9): 10 mg via ORAL
  Filled 2015-10-01 (×8): qty 2

## 2015-10-01 MED ORDER — OXYCODONE HCL 5 MG PO TABS
ORAL_TABLET | ORAL | Status: AC
  Filled 2015-10-01: qty 2

## 2015-10-01 MED ORDER — ONDANSETRON HCL 4 MG/2ML IJ SOLN
INTRAMUSCULAR | Status: DC | PRN
  Administered 2015-10-01: 4 mg via INTRAVENOUS

## 2015-10-01 MED ORDER — ALBUTEROL SULFATE HFA 108 (90 BASE) MCG/ACT IN AERS: 2.0000 | INHALATION_SPRAY | Freq: Two times a day (BID) | RESPIRATORY_TRACT | Status: DC

## 2015-10-01 MED ORDER — HYDROMORPHONE HCL 1 MG/ML IJ SOLN
INTRAMUSCULAR | Status: AC
  Filled 2015-10-01: qty 2

## 2015-10-01 MED ORDER — PROPOFOL 10 MG/ML IV BOLUS
INTRAVENOUS | Status: DC | PRN
  Administered 2015-10-01: 100 mg via INTRAVENOUS

## 2015-10-01 MED ORDER — DOCUSATE SODIUM 100 MG PO CAPS
100.0000 mg | ORAL_CAPSULE | Freq: Two times a day (BID) | ORAL | Status: DC
  Administered 2015-10-02 (×2): 100 mg via ORAL
  Filled 2015-10-01 (×3): qty 1

## 2015-10-01 MED ORDER — METHOCARBAMOL 1000 MG/10ML IJ SOLN
500.0000 mg | Freq: Four times a day (QID) | INTRAVENOUS | Status: DC | PRN
  Filled 2015-10-01: qty 5

## 2015-10-01 MED ORDER — SACCHAROMYCES BOULARDII 250 MG PO CAPS
250.0000 mg | ORAL_CAPSULE | Freq: Two times a day (BID) | ORAL | Status: DC
  Administered 2015-10-01 – 2015-10-03 (×4): 250 mg via ORAL
  Filled 2015-10-01 (×5): qty 1

## 2015-10-01 MED ORDER — SODIUM CHLORIDE 0.9 % IV SOLN
INTRAVENOUS | Status: DC
  Administered 2015-10-02: 06:00:00 via INTRAVENOUS

## 2015-10-01 MED ORDER — METOCLOPRAMIDE HCL 5 MG/ML IJ SOLN: 5.0000 mg | Freq: Three times a day (TID) | INTRAMUSCULAR | Status: DC | PRN

## 2015-10-01 MED ORDER — ACETAMINOPHEN 325 MG PO TABS
650.0000 mg | ORAL_TABLET | Freq: Four times a day (QID) | ORAL | Status: DC | PRN
  Administered 2015-10-02 – 2015-10-03 (×3): 650 mg via ORAL
  Filled 2015-10-01 (×3): qty 2

## 2015-10-01 MED ORDER — SODIUM CHLORIDE 0.9 % IV SOLN
INTRAVENOUS | Status: DC | PRN
  Administered 2015-10-01 (×2): via INTRAVENOUS

## 2015-10-01 MED ORDER — METHOCARBAMOL 500 MG PO TABS
500.0000 mg | ORAL_TABLET | Freq: Four times a day (QID) | ORAL | Status: DC | PRN
  Administered 2015-10-01 – 2015-10-03 (×4): 500 mg via ORAL
  Filled 2015-10-01 (×4): qty 1

## 2015-10-01 MED ORDER — ACETAMINOPHEN 650 MG RE SUPP: 650.0000 mg | Freq: Four times a day (QID) | RECTAL | Status: DC | PRN

## 2015-10-01 MED ORDER — CEFAZOLIN SODIUM 1-5 GM-% IV SOLN
1.0000 g | Freq: Three times a day (TID) | INTRAVENOUS | Status: AC
  Administered 2015-10-01 – 2015-10-02 (×3): 1 g via INTRAVENOUS
  Filled 2015-10-01 (×3): qty 50

## 2015-10-01 MED ORDER — TRAZODONE HCL 50 MG PO TABS
25.0000 mg | ORAL_TABLET | Freq: Every evening | ORAL | Status: DC | PRN
  Administered 2015-10-02: 25 mg via ORAL
  Filled 2015-10-01: qty 1

## 2015-10-01 MED ORDER — CHLORHEXIDINE GLUCONATE 4 % EX LIQD: 60.0000 mL | Freq: Once | CUTANEOUS | Status: DC

## 2015-10-01 SURGICAL SUPPLY — 35 items
BLADE SAW RECIP 87.9 MT (BLADE) ×3 IMPLANT
BLADE SURG 21 STRL SS (BLADE) ×3 IMPLANT
BNDG COHESIVE 6X5 TAN STRL LF (GAUZE/BANDAGES/DRESSINGS) ×6 IMPLANT
BNDG GAUZE ELAST 4 BULKY (GAUZE/BANDAGES/DRESSINGS) ×6 IMPLANT
COVER SURGICAL LIGHT HANDLE (MISCELLANEOUS) ×6 IMPLANT
CUFF TOURNIQUET SINGLE 34IN LL (TOURNIQUET CUFF) IMPLANT
CUFF TOURNIQUET SINGLE 44IN (TOURNIQUET CUFF) IMPLANT
DRAPE EXTREMITY T 121X128X90 (DRAPE) ×3 IMPLANT
DRAPE INCISE IOBAN 66X45 STRL (DRAPES) IMPLANT
DRAPE PROXIMA HALF (DRAPES) ×3 IMPLANT
DRAPE U-SHAPE 47X51 STRL (DRAPES) ×3 IMPLANT
DRSG ADAPTIC 3X8 NADH LF (GAUZE/BANDAGES/DRESSINGS) ×3 IMPLANT
DRSG PAD ABDOMINAL 8X10 ST (GAUZE/BANDAGES/DRESSINGS) ×3 IMPLANT
DURAPREP 26ML APPLICATOR (WOUND CARE) ×3 IMPLANT
ELECT REM PT RETURN 9FT ADLT (ELECTROSURGICAL) ×3
ELECTRODE REM PT RTRN 9FT ADLT (ELECTROSURGICAL) ×1 IMPLANT
GAUZE SPONGE 4X4 12PLY STRL (GAUZE/BANDAGES/DRESSINGS) ×3 IMPLANT
GLOVE BIOGEL PI IND STRL 9 (GLOVE) ×1 IMPLANT
GLOVE BIOGEL PI INDICATOR 9 (GLOVE) ×2
GLOVE SURG ORTHO 9.0 STRL STRW (GLOVE) ×3 IMPLANT
GOWN STRL REUS W/ TWL XL LVL3 (GOWN DISPOSABLE) ×2 IMPLANT
GOWN STRL REUS W/TWL XL LVL3 (GOWN DISPOSABLE) ×4
KIT BASIN OR (CUSTOM PROCEDURE TRAY) ×3 IMPLANT
KIT ROOM TURNOVER OR (KITS) ×3 IMPLANT
MANIFOLD NEPTUNE II (INSTRUMENTS) ×3 IMPLANT
NS IRRIG 1000ML POUR BTL (IV SOLUTION) ×3 IMPLANT
PACK GENERAL/GYN (CUSTOM PROCEDURE TRAY) ×3 IMPLANT
PAD ARMBOARD 7.5X6 YLW CONV (MISCELLANEOUS) ×3 IMPLANT
SPONGE LAP 18X18 X RAY DECT (DISPOSABLE) IMPLANT
STAPLER VISISTAT 35W (STAPLE) IMPLANT
STOCKINETTE IMPERVIOUS LG (DRAPES) ×3 IMPLANT
SUT SILK 2 0 (SUTURE) ×2
SUT SILK 2-0 18XBRD TIE 12 (SUTURE) ×1 IMPLANT
SUT VIC AB 1 CTX 27 (SUTURE) IMPLANT
TOWEL OR 17X26 10 PK STRL BLUE (TOWEL DISPOSABLE) ×3 IMPLANT

## 2015-10-01 NOTE — Care Management (Signed)
Utilization review completed. Latoiya Maradiaga, RN Case Manager [phone]. 

## 2015-10-01 NOTE — Anesthesia Preprocedure Evaluation (Signed)
Anesthesia Evaluation  Patient identified by MRN, date of birth, ID band Patient awake    Reviewed: Allergy & Precautions, NPO status , Patient's Chart, lab work & pertinent test results  Airway Mallampati: II  TM Distance: >3 FB Neck ROM: Full    Dental no notable dental hx. (+) Missing, Poor Dentition   Pulmonary neg pulmonary ROS, COPD, Current Smoker,    Pulmonary exam normal breath sounds clear to auscultation       Cardiovascular negative cardio ROS Normal cardiovascular exam Rhythm:Regular Rate:Normal     Neuro/Psych negative neurological ROS  negative psych ROS   GI/Hepatic negative GI ROS, Neg liver ROS,   Endo/Other  negative endocrine ROS  Renal/GU negative Renal ROS  negative genitourinary   Musculoskeletal negative musculoskeletal ROS (+)   Abdominal   Peds negative pediatric ROS (+)  Hematology negative hematology ROS (+) anemia ,   Anesthesia Other Findings   Reproductive/Obstetrics negative OB ROS                             Anesthesia Physical Anesthesia Plan  ASA: III  Anesthesia Plan: General   Post-op Pain Management:    Induction: Intravenous  Airway Management Planned: LMA  Additional Equipment:   Intra-op Plan:   Post-operative Plan: Extubation in OR  Informed Consent: I have reviewed the patients History and Physical, chart, labs and discussed the procedure including the risks, benefits and alternatives for the proposed anesthesia with the patient or authorized representative who has indicated his/her understanding and acceptance.   Dental advisory given  Plan Discussed with: CRNA  Anesthesia Plan Comments:         Anesthesia Quick Evaluation

## 2015-10-01 NOTE — H&P (Signed)
Caleb Hays is an 75 y.o. male.   Chief Complaint: Osteomyelitis abscess right ankle HPI: Patient is a 75 year old gentleman who is status post limb salvage intervention for an infected gouty right ankle. Patient underwent arthroscopic debridement and placement of antibiotic beads 2 months ago. Patient presents at this time with recurrent abscess and draining purulence from the right ankle.  Past Medical History  Diagnosis Date  . Gout   . COPD (chronic obstructive pulmonary disease) (HCC)     LONG TIME SMOKER  . 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 Lajoyce Cornersuda   . Critical lower limb ischemia   . Pyelonephritis 06/23/2015  . Arthritis     "hands" (06/23/2015)  . Depression     "periods of depression" (06/23/2015)  . Adult failure to thrive     /notes 06/23/2015  . Physical deconditioning     Past Surgical History  Procedure Laterality Date  . Knee cartilage surgery Left 1960's    football injury  . Amputation Right 03/01/2014    Procedure: AMPUTATION RAY;  Surgeon: Nadara MustardMarcus Luisa Louk V, MD;  [facility];  Service: Orthopedics;  Laterality: Right;  Right Foot 5th Ray Amputation  . Amputation Right 04/26/2014    Procedure: Right Foot 4th Ray Amputation;  Surgeon: Nadara MustardMarcus Tyna Huertas V, MD;  [facility];  Service: Orthopedics;  Laterality: Right;  . Lower extremity angiogram N/A 05/27/2014    Procedure: LOWER EXTREMITY ANGIOGRAM;  Surgeon: Runell GessJonathan J Berry, MD;  [facility];  Service: Cardiovascular;  Laterality: N/A;  . Inguinal hernia repair Left   . Knee ligament reconstruction Left 1960's  . Cataract extraction w/ intraocular lens  implant, bilateral Bilateral   . Ankle arthroscopy Right 08/13/2015    Procedure: ANKLE ARTHROSCOPY;  Surgeon: Nadara MustardMarcus Ellsie Violette V, MD;  [facility];  Service: Orthopedics;  Laterality: Right;    Family History  Problem Relation Age of Onset  . Heart failure Father   . Heart attack Father   . Heart failure Brother    Social  History:  reports that he has been smoking Cigarettes.  He has a 14.25 pack-year smoking history. He has never used smokeless tobacco. He reports that he drinks alcohol. He reports that he does not use illicit drugs.  Allergies: No Known Allergies  No prescriptions prior to admission    No results found for this or any previous visit (from the past 48 hour(s)). No results found.  Review of Systems  All other systems reviewed and are negative.   There were no vitals taken for this visit. Physical Exam  Assessment: Peripheral vascular disease without palpable pulses with a purulent draining abscess from the right ankle. Assessment/Plan Assessment: Abscess osteomyelitis right ankle.  Plan: Patient is failed limb salvage intervention with irrigation debridement and placement of antibiotic beads. He has purulent drainage from the ankle and presents at this time for transtibial amputation. Risk and benefits were discussed including risk of the wound not healing. Patient states he understands wish to proceed at this time.  Nadara MustardUDA,Tamella Tuccillo V, MD 10/01/2015, 6:53 AM

## 2015-10-01 NOTE — Anesthesia Procedure Notes (Signed)
Procedure Name: LMA Insertion Date/Time: 10/01/2015 1:25 PM Performed by: Arlice ColtMANESS, Meylin Stenzel B Pre-anesthesia Checklist: Patient identified, Emergency Drugs available, Suction available, Patient being monitored and Timeout performed Patient Re-evaluated:Patient Re-evaluated prior to inductionOxygen Delivery Method: Circle system utilized Preoxygenation: Pre-oxygenation with 100% oxygen Intubation Type: IV induction LMA: LMA inserted LMA Size: 4.0 Number of attempts: 1 Placement Confirmation: positive ETCO2 and breath sounds checked- equal and bilateral Tube secured with: Tape Dental Injury: Teeth and Oropharynx as per pre-operative assessment

## 2015-10-01 NOTE — Transfer of Care (Signed)
Immediate Anesthesia Transfer of Care Note  Patient: Caleb DuboisJerald J Kage  Procedure(s) Performed: Procedure(s): Right Below Knee Amputation (Right)  Patient Location: PACU  Anesthesia Type:General  Level of Consciousness: awake, alert  and oriented  Airway & Oxygen Therapy: Patient Spontanous Breathing  Post-op Assessment: Report given to RN and Post -op Vital signs reviewed and stable  Post vital signs: Reviewed and stable  Last Vitals:  Filed Vitals:   10/01/15 1049  BP: 124/80  Pulse: 74  Temp: 36.4 C    Complications: No apparent anesthesia complications

## 2015-10-01 NOTE — Op Note (Signed)
   Date of Surgery: 10/01/2015  INDICATIONS: Mr. Caleb Hays is a 75 y.o.-year-old male who has chronic abscess osteomyelitis right ankle presents after failure of conservative treatment with irrigation debridement placement of antibiotic beads with IV antibiotics and still has persistent purulent abscess drainage and presents for transtibial amputation.Marland Kitchen.  PREOPERATIVE DIAGNOSIS: Abscess and osteomyelitis right ankle  POSTOPERATIVE DIAGNOSIS: Same.  PROCEDURE: Transtibial amputation right Application of Prevena wound VAC  SURGEON: Lajoyce Cornersuda, M.D.  ANESTHESIA:  general  IV FLUIDS AND URINE: See anesthesia.  ESTIMATED BLOOD LOSS: Minimal mL.  COMPLICATIONS: None.  DESCRIPTION OF PROCEDURE: The patient was brought to the operating room and underwent a general anesthetic. After adequate levels of anesthesia were obtained patient's lower extremity was prepped using DuraPrep draped into a sterile field. A timeout was called. The foot was draped out of the sterile field with impervious stockinette. A transverse incision was made 11 cm distal to the tibial tubercle. This curved proximally and a large posterior flap was created. The tibia was transected 1 cm proximal to the skin incision. The fibula was transected just proximal to the tibial incision. The tibia was beveled anteriorly. A large posterior flap was created. The sciatic nerve was pulled cut and allowed to retract. The vascular bundles were suture ligated with 2-0 silk. The deep and superficial fascial layers were closed using #1 Vicryl. The skin was closed using staples and 2-0 nylon. The wound was covered with a Prevena wound VAC. There was a good suction fit. Patient was extubated taken to the PACU in stable condition.  Aldean BakerMarcus Duda, MD [facility] 1:56 PM

## 2015-10-02 ENCOUNTER — Encounter (HOSPITAL_COMMUNITY): Payer: Self-pay | Admitting: Orthopedic Surgery

## 2015-10-02 MED ORDER — OXYCODONE-ACETAMINOPHEN 5-325 MG PO TABS: 1.0000 | ORAL_TABLET | ORAL | Status: DC | PRN

## 2015-10-02 NOTE — NC FL2 (Signed)
[place] MEDICAID FL2 LEVEL OF CARE SCREENING TOOL     IDENTIFICATION  Patient Name: Caleb Hays Birthdate: 09-11-1940 Sex: male Admission Date (Current Location): 10/01/2015  [facility] and [place] Number:  Producer, television/film/video and Address:  The [place], [address], [place], [place] [address]      Provider Number: [id number]  Attending Physician Name and Address:  Nadara Mustard, MD  Relative Name and Phone Number:       Current Level of Care: Hospital Recommended Level of Care: Skilled Nursing Facility Prior Approval Number:    Date Approved/Denied:   PASRR Number:    Discharge Plan:      Current Diagnoses: Patient Active Problem List   Diagnosis Date Noted  . Below knee amputation status (HCC) 10/01/2015  . Ankle pain 08/13/2015  . Cellulitis of right lower extremity 08/13/2015  . Adult failure to thrive   . PVD (peripheral vascular disease) (HCC)   . Cellulitis 08/10/2015  . Cellulitis of right foot 08/10/2015  . Acute kidney injury superimposed on chronic kidney disease (HCC) 08/10/2015  . Elevated blood uric acid level 08/10/2015  . Gout 08/10/2015  . Anemia of chronic disease 08/10/2015  . Malnutrition of moderate degree 06/24/2015  . Pressure ulcer 06/24/2015  . UTI (lower urinary tract infection) 06/23/2015  . Pyelonephritis 06/23/2015  . Acute renal failure (ARF) (HCC) 06/23/2015  . Failure to thrive in adult 06/23/2015  . Weight loss 06/23/2015  . Tobacco abuse 06/23/2015  . Hypoxia 06/23/2015  . Dehydration, moderate 06/23/2015  . COPD (chronic obstructive pulmonary disease) (HCC) 06/23/2015  . Fall   . Lactic acidosis   . Critical lower limb ischemia 05/31/2014  . Status post right foot surgery 03/12/2014    Orientation RESPIRATION BLADDER Height & Weight     Self, Time, Situation, Place  Normal Continent Weight: 168 lb (76.204 kg) Height:   (193 cm)  BEHAVIORAL SYMPTOMS/MOOD NEUROLOGICAL BOWEL NUTRITION  STATUS      Continent Diet (please see discharge summary for dietary needs )  AMBULATORY STATUS COMMUNICATION OF NEEDS Skin   Limited Assist Verbally Wound Vac, Surgical wounds                       Personal Care Assistance Level of Assistance  Dressing, Bathing Bathing Assistance: Limited assistance   Dressing Assistance: Limited assistance     Functional Limitations Info             SPECIAL CARE FACTORS FREQUENCY  PT (By licensed PT), OT (By licensed OT)                    Contractures Contractures Info: Not present    Additional Factors Info                  Current Medications (10/02/2015):  This is the current hospital active medication list Current Facility-Administered Medications  Medication Dose Route Frequency Provider Last Rate Last Dose  . 0.9 %  sodium chloride infusion   Intravenous Continuous Nadara Mustard, MD 10 mL/hr at 10/02/15 0600    . acetaminophen (TYLENOL) tablet 650 mg  650 mg Oral Q6H PRN Nadara Mustard, MD   650 mg at 10/02/15 0741   Or  . acetaminophen (TYLENOL) suppository 650 mg  650 mg Rectal Q6H PRN Nadara Mustard, MD      . aspirin EC tablet 325 mg  325 mg Oral Daily Berna Spare  Kandis Mannanuda V, MD   325 mg at 10/02/15 0757  . docusate sodium (COLACE) capsule 100 mg  100 mg Oral BID Nadara MustardMarcus Duda V, MD   100 mg at 10/02/15 0757  . HYDROmorphone (DILAUDID) injection 1 mg  1 mg Intravenous Q2H PRN Nadara MustardMarcus Duda V, MD   1 mg at 10/02/15 0232  . methocarbamol (ROBAXIN) tablet 500 mg  500 mg Oral Q6H PRN Nadara MustardMarcus Duda V, MD   500 mg at 10/02/15 0741   Or  . methocarbamol (ROBAXIN) 500 mg in dextrose 5 % 50 mL IVPB  500 mg Intravenous Q6H PRN Nadara MustardMarcus Duda V, MD      . metoCLOPramide (REGLAN) tablet 5-10 mg  5-10 mg Oral Q8H PRN Nadara MustardMarcus Duda V, MD       Or  . metoCLOPramide (REGLAN) injection 5-10 mg  5-10 mg Intravenous Q8H PRN Nadara MustardMarcus Duda V, MD      . ondansetron [facility](ZOFRAN) tablet 4 mg  4 mg Oral Q6H PRN Nadara MustardMarcus Duda V, MD       Or  . ondansetron  [facility](ZOFRAN) injection 4 mg  4 mg Intravenous Q6H PRN Aldean BakerMarcus Duda V, MD      . oxyCODONE (Oxy IR/ROXICODONE) immediate release tablet 5-10 mg  5-10 mg Oral Q3H PRN Nadara MustardMarcus Duda V, MD   10 mg at 10/02/15 0741  . saccharomyces boulardii (FLORASTOR) capsule 250 mg  250 mg Oral BID Nadara MustardMarcus Duda V, MD   250 mg at 10/02/15 0757  . senna (SENOKOT) tablet 8.6 mg  1 tablet Oral BID Nadara MustardMarcus Duda V, MD   8.6 mg at 10/02/15 0757  . traZODone (DESYREL) tablet 25 mg  25 mg Oral QHS PRN Nadara MustardMarcus Duda V, MD   25 mg at 10/02/15 0046     Discharge Medications: Please see discharge summary for a list of discharge medications.  Relevant Imaging Results:  Relevant Lab Results:   Additional Information SSN [id number]  Rondel Batonngle, Larell Baney C, [place]

## 2015-10-02 NOTE — Clinical Social Work Note (Signed)
[facility] is from [facility] (ALF)  [phone].    PT/OT will be needed prior to placement, per insurance requirements ([employer] not [employer]).    Disposition pending PT recommendations.  Vickii PennaGina Kamorie Aldous, LCSW [phone]  5N1-9; 2S 15-16 and Hospital Psychiatric Service Line Licensed Clinical Social Worker   .

## 2015-10-02 NOTE — Discharge Instructions (Signed)
Continue the incisional wound VAC for 1 week. May remove wound VAC if it starts to alarm. Once wound VAC is removed start daily dressing changes with dry dressing.

## 2015-10-02 NOTE — Progress Notes (Signed)
Occupational Therapy Evaluation Patient Details Name: Caleb Hays MRN: [id number] DOB: 03/06/1941 Today's Date: 10/02/2015    History of Present Illness 75 y.o. male admitted to [facility] on 10/01/15 for R BKA with wound vac placement.  Pt with significant PMHx of  FTT, physical deconditioning, COPD, gout, L knee cartilage and ligament surgery, and multiple R LE amputations.      Clinical Impression   PTA, pt was living at [facility]. Pt states he was primarily using a w/c for mobility due to pain with the R foot. Pt currently requires mod A +2 for mobility and mod A with ADL. Pt very motivated to get stronger and become more independent. Pt will benefit from rehab at SNF. Will follow acutely.     Follow Up Recommendations  SNF;Supervision/Assistance - 24 hour    Equipment Recommendations  None recommended by OT    Recommendations for Other Services       Precautions / Restrictions Precautions Precautions: Fall Precaution Comments: wound vac Restrictions RLE Weight Bearing: Non weight bearing      Mobility Bed Mobility Overal bed mobility: Modified Independent             General bed mobility comments: HOB elevated, using bed rail  Transfers Overall transfer level: Needs assistance Equipment used: Rolling walker (2 wheeled) Transfers: Sit to/from [employer] Transfers Sit to Stand: +2 safety/equipment;Mod assist Stand pivot transfers: Min assist;+2 physical assistance       General transfer comment: Two person mod assist for safety to help support trunk and stabilize RW.  "I have to keep telling myself that my right foot is not still there" pt kept saying in standing.      Balance Overall balance assessment: Needs assistance Sitting-balance support: Feet supported Sitting balance-Leahy Scale: Good     Standing balance support: Bilateral upper extremity supported Standing balance-Leahy Scale: Poor                              ADL Overall ADL's :  Needs assistance/impaired Eating/Feeding: Modified independent   Grooming: Set up;Sitting   Upper Body Bathing: Set up;Sitting   Lower Body Bathing: Moderate assistance;Sit to/from stand   Upper Body Dressing : Supervision/safety;Set up;Sitting   Lower Body Dressing: Moderate assistance;Sit to/from stand       Toileting- Architect and Hygiene: Total assistance (incontinent) Toileting - Clothing Manipulation Details (indicate cue type and reason): wears dependes     Functional mobility during ADLs: +2 for physical assistance;Moderate assistance;Rolling walker;Cueing for safety;Cueing for sequencing General ADL Comments: Initially not wanting to participate due to not feeling well. Encouraged participation adn pt did very well. Able to reach down to touch top of L foot in sitting     Vision     Perception     Praxis      Pertinent Vitals/Pain Pain Assessment: 0-10 Pain Score: 5  Pain Location: RLE Pain Descriptors / Indicators: Aching Pain Intervention(s): Limited activity within patient's tolerance     Hand Dominance     Extremity/Trunk Assessment Upper Extremity Assessment Upper Extremity Assessment: Overall WFL for tasks assessed   Lower Extremity Assessment Lower Extremity Assessment: Defer to PT evaluation RLE Deficits / Details: right leg with normal post op pain, good ROM still at the knee, strength at least 3/5 functionally, no reports of phantom limb pain.   LLE Deficits / Details: left leg with h/o left knee surgery and arthritis.  Good strength, at least  3+ to 4/5 per gross in bed MMT and fucntional abilities (he could support himself and hop with the use of his arms on RW on one leg.    Cervical / Trunk Assessment Cervical / Trunk Assessment: Normal   Communication     Cognition Arousal/Alertness: Awake/alert Behavior During Therapy: WFL for tasks assessed/performed Overall Cognitive Status: Within Functional Limits for tasks assessed  (appears [facility] Simultaneous filing. User may not have seen previous data.)                     General Comments       Exercises       Shoulder Instructions      Home Living Family/patient expects to be discharged to:: Skilled nursing facility                                 Additional Comments: Used to ~4 months ago live with niece.  Has been in 2 different SNFs since then-[facility] and another one he cannot remember the name, but does not want to return to that one.        Prior Functioning/Environment Level of Independence: Needs assistance  Gait / Transfers Assistance Needed: has not walked in 3 months, could transfer to WC mod I level ADL's / Homemaking Assistance Needed: Needs ~50% assist to bathe and dress.        OT Diagnosis: Generalized weakness;Acute pain   OT Problem List: Decreased strength;Decreased activity tolerance;Impaired balance (sitting and/or standing);Decreased safety awareness;Decreased knowledge of use of DME or AE;Decreased knowledge of precautions;Pain   OT Treatment/Interventions: Self-care/ADL training;Therapeutic exercise;DME and/or AE instruction;Therapeutic activities;Patient/family education;Balance training    OT Goals(Current goals can be found in the care plan section) Acute Rehab OT Goals Patient Stated Goal: he wants to be able to get stronger OT Goal Formulation: With patient Time For Goal Achievement: 10/16/15 Potential to Achieve Goals: Good ADL Goals Pt Will Perform Lower Body Bathing: with min assist;bed level;sitting/lateral leans Pt Will Transfer to Toilet: with min assist;bedside commode;stand pivot transfer Pt Will Perform Toileting - Clothing Manipulation and hygiene: with supervision;with set-up;sitting/lateral leans Pt/caregiver will Perform Home Exercise Program: Increased strength;Both right and left upper extremity;With written HEP provided;With theraband;With Supervision Additional ADL Goal  #1: verbalize understanding of desensitization of RLE to reduce pain and facilitate rehab process with min vc  OT Frequency: Min 2X/week   Barriers to D/C: Decreased caregiver support          Co-evaluation PT/OT/SLP Co-Evaluation/Treatment: Yes Reason for Co-Treatment: Complexity of the patient's impairments (multi-system involvement);For patient/therapist safety   OT goals addressed during session: ADL's and self-care      End of Session Equipment Utilized During Treatment: Gait belt;Rolling walker Nurse Communication: Mobility status  Activity Tolerance: Patient tolerated treatment well Patient left: in chair;with call bell/phone within reach;with chair alarm set   Time: 1530-1605 OT Time Calculation (min): 35 min Charges:  OT General Charges $OT Visit: 1 Procedure OT Evaluation $OT Eval Moderate Complexity: 1 Procedure G-Codes:    Mccartney Brucks,HILLARY 10/02/2015, 4:20 PM   [facility] Centerilary Alcus Bradly, OTR/L  [phone] 10/02/2015

## 2015-10-02 NOTE — Evaluation (Signed)
Physical Therapy Evaluation Patient Details Name: ALTONIO SCHWERTNER MRN: [id number] DOB: 05-07-1941 Today's Date: 10/02/2015   History of Present Illness  75 y.o. male admitted to [facility] on 10/01/15 for R BKA with wound vac placement.  Pt with significant PMHx of  FTT, physical deconditioning, COPD, gout, L knee cartilage and ligament surgery, and multiple R LE amputations.     Clinical Impression  Pt is POD #1 and was able with two person assist for safety get up and take some pivotal hop steps to the recliner chair with RW.  He has some impressive initial strength for someone who has essentially not been walking for a few months now due to right foot pain.  PT to follow acutely until d/c confirmed.       Follow Up Recommendations SNF    Equipment Recommendations  Wheelchair (measurements PT);Wheelchair cushion (measurements PT);3in1 (PT);Rolling walker with 5" wheels    Recommendations for Other Services   NA    Precautions / Restrictions Precautions Precautions: Fall Restrictions RLE Weight Bearing: Non weight bearing      Mobility  Bed Mobility Overal bed mobility: Modified Independent             General bed mobility comments: HOB elevated, using bed rail  Transfers Overall transfer level: Needs assistance Equipment used: Rolling walker (2 wheeled) Transfers: Sit to/from [employer] Sit to Stand: +2 safety/equipment;Mod assist Stand pivot transfers: +2 safety/equipment;Min assist       General transfer comment: Two person mod assist for safety to help support trunk and stabilize RW.  "I have to keep telling myself that my right foot is not still there" pt kept saying in standing.    Ambulation/Gait             General Gait Details: only pivotal steps         Balance Overall balance assessment: Needs assistance Sitting-balance support: Feet supported;No upper extremity supported;Bilateral upper extremity supported Sitting balance-Leahy  Scale: Good     Standing balance support: Bilateral upper extremity supported Standing balance-Leahy Scale: Poor                               Pertinent Vitals/Pain Pain Assessment: 0-10 Pain Score: 5  Pain Location: right lower leg Pain Descriptors / Indicators: Aching;Burning Pain Intervention(s): Limited activity within patient's tolerance;Monitored during session;Repositioned    Home Living Family/patient expects to be discharged to:: Skilled nursing facility                 Additional Comments: Used to ~4 months ago live with niece.  Has been in 2 different SNFs since then-[facility] and another one he cannot remember the name, but does not want to return to that one.      Prior Function Level of Independence: Needs assistance   Gait / Transfers Assistance Needed: has not walked in 3 months, could transfer to WC mod I level  ADL's / Homemaking Assistance Needed: Needs ~50% assist to bathe and dress.           Extremity/Trunk Assessment   Upper Extremity Assessment: Defer to OT evaluation           Lower Extremity Assessment: RLE deficits/detail;LLE deficits/detail RLE Deficits / Details: right leg with normal post op pain, good ROM still at the knee, strength at least 3/5 functionally, no reports of phantom limb pain.   LLE Deficits / Details: left leg with h/o  left knee surgery and arthritis.  Good strength, at least 3+ to 4/5 per gross in bed MMT and fucntional abilities (he could support himself and hop with the use of his arms on RW on one leg.         Cognition Arousal/Alertness: Awake/alert Behavior During Therapy: WFL for tasks assessed/performed Overall Cognitive Status: Within Functional Limits for tasks assessed (Simultaneous filing. User may not have seen previous data.)                      General Comments General comments (skin integrity, edema, etc.): Educated on phantom limb pain and self massage (not on  wound/incision site, but around his knee and thigh) to help re train the brain that there is no longer a foot there.           Assessment/Plan    PT Assessment Patient needs continued PT services  PT Diagnosis Difficulty walking;Generalized weakness;Abnormality of gait;Acute pain   PT Problem List Decreased strength;Decreased activity tolerance;Decreased balance;Decreased mobility;Decreased knowledge of use of DME;Pain  PT Treatment Interventions DME instruction;Gait training;Functional mobility training;Therapeutic activities;Balance training;Therapeutic exercise;Neuromuscular re-education;Patient/family education;Manual techniques;Modalities;Wheelchair mobility training   PT Goals (Current goals can be found in the Care Plan section) Acute Rehab PT Goals Patient Stated Goal: he wants to be able to get stronger PT Goal Formulation: With patient Time For Goal Achievement: 10/09/15 Potential to Achieve Goals: Good    Frequency Min 3X/week        Co-evaluation PT/OT/SLP Co-Evaluation/Treatment: Yes Reason for Co-Treatment: For patient/therapist safety           End of Session Equipment Utilized During Treatment: Gait belt Activity Tolerance: Patient limited by fatigue;Patient limited by pain Patient left: in chair;with call bell/phone within reach;with chair alarm set Nurse Communication: Mobility status (to RN tech)         Time: 2956-21301538-1601 PT Time Calculation (min) (ACUTE ONLY): 23 min   Charges:   PT Evaluation $PT Eval Moderate Complexity: 1 Procedure          Lyndsie Wallman B. Loudon Krakow, PT, DPT [phone]   10/02/2015, 4:13 PM

## 2015-10-02 NOTE — Discharge Summary (Signed)
Physician Discharge Summary  Patient ID: Caleb Hays MRN: [id number] DOB/AGE: [birth year] 75 y.o.  Admit date: 10/01/2015 Discharge date: 10/02/2015  Admission Diagnoses:Osteomyelitis right ankle  Discharge Diagnoses:  Active Problems:   Below knee amputation status [facility])   Discharged Condition: stable  Hospital Course: Patient's hospital course was essentially unremarkable. Patient underwent a transtibial amputation on the right. An incisional wound VAC was applied. Patient to be discharged with the incisional wound VAC and wear this for 1 week. If the [facility] begins to alarm discontinue the VAC dressing and start dry dressing changes daily.  Consults: None  Significant Diagnostic Studies: labs: Routine labs  Treatments: surgery: See operative note  Discharge Exam: Blood pressure 113/59, pulse 58, temperature 97.1 F (36.2 C), temperature source Oral, resp. rate 18, height  (1.93 m), weight 76.204 kg (168 lb), SpO2 99 %. Incision/Wound: Incisional wound VAC functioning well  Disposition: 06-Home-Health Care Svc  Discharge Instructions    Call MD / Call 911    Complete by:  As directed   If you experience chest pain or shortness of breath, CALL 911 and be transported to the hospital emergency room.  If you develope a fever above 101 F, pus (white drainage) or increased drainage or redness at the wound, or calf pain, call your surgeon's office.     Constipation Prevention    Complete by:  As directed   Drink plenty of fluids.  Prune juice may be helpful.  You may use a stool softener, such as Colace (over the counter) 100 mg twice a day.  Use MiraLax (over the counter) for constipation as needed.     Diet - low sodium heart healthy    Complete by:  As directed      Increase activity slowly as tolerated    Complete by:  As directed      Non weight bearing    Complete by:  As directed   Laterality:  right  Extremity:  Lower            Medication List    STOP taking  these medications        nitroGLYCERIN 0.2 mg/hr patch  Commonly known as:  NITRODUR - Dosed in mg/24 hr      TAKE these medications        albuterol 108 (90 Base) MCG/ACT inhaler  Commonly known as:  PROVENTIL HFA;VENTOLIN HFA  Inhale 2 puffs into the lungs 2 (two) times daily. Take every daily per niece     colchicine 0.6 MG tablet  Take 1 tablet (0.6 mg total) by mouth 2 (two) times daily.     docusate sodium 100 MG capsule  Commonly known as:  COLACE  Take 1 capsule (100 mg total) by mouth 2 (two) times daily.     meloxicam 7.5 MG tablet  Commonly known as:  MOBIC  Take 7.5 mg by mouth daily.     methocarbamol 500 MG tablet  Commonly known as:  ROBAXIN  Take 1 tablet (500 mg total) by mouth every 6 (six) hours as needed for muscle spasms.     NUTRA SHAKE PO  Take 1 Can by mouth 3 (three) times daily with meals. GREAT SHAKE     oxyCODONE-acetaminophen 5-325 MG tablet  Commonly known as:  PERCOCET/ROXICET  Take 1 tablet by mouth every 6 (six) hours as needed for moderate pain.     oxyCODONE-acetaminophen 5-325 MG tablet  Commonly known as:  ROXICET  Take 1 tablet by mouth  every 4 (four) hours as needed for severe pain.     saccharomyces boulardii 250 MG capsule  Commonly known as:  FLORASTOR  Take 1 capsule (250 mg total) by mouth 2 (two) times daily.     senna 8.6 MG Tabs tablet  Commonly known as:  SENOKOT  Take 1 tablet (8.6 mg total) by mouth 2 (two) times daily.     traZODone 50 MG tablet  Commonly known as:  DESYREL  Take 0.5 tablets (25 mg total) by mouth at bedtime as needed for sleep.         SignedNadara Hays: Caleb Hays V 10/02/2015, 6:12 PM

## 2015-10-02 NOTE — Progress Notes (Signed)
Patient ID: Caleb MarinerJerald J Roye, male   DOB: [birth year], 75 y.o.   MRN: [id number] Postoperative day 1 right transtibial amputation. Patient's wound VAC is functioning well. Patient states his pain is much better now than it was prior to surgery. Patient states he wants to return to a skilled nursing facility but does not want to go back to his previous facility.

## 2015-10-03 NOTE — Anesthesia Postprocedure Evaluation (Signed)
Anesthesia Post Note  Patient: Barton DuboisJerald J Farone  Procedure(s) Performed: Procedure(s) (LRB): Right Below Knee Amputation (Right)  Patient location during evaluation: PACU Anesthesia Type: General Level of consciousness: awake and alert Pain management: pain level controlled Vital Signs Assessment: post-procedure vital signs reviewed and stable Respiratory status: spontaneous breathing, nonlabored ventilation, respiratory function stable and patient connected to nasal cannula oxygen Cardiovascular status: blood pressure returned to baseline and stable Postop Assessment: no signs of nausea or vomiting Anesthetic complications: no    Last Vitals:  Filed Vitals:   10/02/15 0500 10/02/15 1300  BP: 123/62 113/59  Pulse: 56 58  Temp: 36.4 C 36.2 C  Resp: 18 18    Last Pain:  Filed Vitals:   10/02/15 2237  PainSc: 7                  Phillips Groutarignan, Finnis Colee

## 2015-10-03 NOTE — Clinical Social Work Note (Signed)
PASARR # [id number]  Vickii PennaGina Yarisa Lynam, LCSW [phone]  5N1-9; 2S 15-16 and Hospital Psychiatric Service Line Licensed Clinical Social Worker

## 2015-10-03 NOTE — Clinical Social Work Note (Addendum)
CSW met with patient at bedside to review bed offers.  Patient has chosen [facility] will initiate insurance authorization.    Patient will discharge today per MD order. Patient will discharge to [facility] SN[facility] RN to call report prior to transportation to: [phone] Transportation: [facility]- to be scheduled after insurance authorization is received and admission's paperwork completed at bedside  CSW sent discharge summary to SNF for review.  Packet is complete.  RN, patient and family (neice- [name]- CSW contacted and reviewed discharge plans-provided address/phone number for SNF) aware of discharge plans.  Nonnie Done, LCSW [phone]  5N1-9, 2S 15-16 and Psychiatric Service Line  Licensed Clinical Social Worker

## 2015-10-03 NOTE — Progress Notes (Addendum)
[facility] to give report to AvonJen LPN @ 1:616:20 states pt is going to [address] [place]

## 2015-10-03 NOTE — Clinical Social Work Placement (Signed)
   CLINICAL SOCIAL WORK PLACEMENT  NOTE  Date:  10/03/2015  Patient Details  Name: Caleb Hays MRN: [id number] Date of Birth: [birth year]  Clinical Social Work is seeking post-discharge placement for this patient at the Skilled  Nursing Facility level of care (*CSW will initial, date and re-position this form in  chart as items are completed):  Yes   Patient/family provided with [facility] Social Work Department's list of facilities offering this level of care within the geographic area requested by the patient (or if unable, by the patient's family).  Yes   Patient/family informed of their freedom to choose among providers that offer the needed level of care, that participate in Medicare, Medicaid or managed care program needed by the patient, have an available bed and are willing to accept the patient.  Yes   Patient/family informed of [facility]'s ownership interest in [facility] and [facility], as well as of the fact that they are under no obligation to receive care at these facilities.  PASRR submitted to EDS on       PASRR number received on       Existing PASRR number confirmed on 10/03/15     FL2 transmitted to all facilities in geographic area requested by pt/family on 10/03/15     FL2 transmitted to all facilities within larger geographic area on       Patient informed that his/her managed care company has contracts with or will negotiate with certain facilities, including the following:        Yes   Patient/family informed of bed offers received.  [facility]     Physician recommends and patient chooses bed at      [facility].  Patient to be transferred to facility by [facility]     Patient family notified on 10/03/15 of transfer.  Name of family member notified:  patient and spouse     PHYSICIAN Please prepare priority discharge summary, including medications     Additional  Comment:    _______________________________________________ Rondel BatonIngle, Jania Steinke C, LCSW 10/03/2015, 9:46 AM

## 2015-10-03 NOTE — Care Management Important Message (Signed)
Important Message  Patient Details  Name: Caleb MarinerJerald J Hays MRN: [id number] Date of Birth: 17-Sep-1940   Medicare Important Message Given:  Yes    Melda Mermelstein P Bob Daversa 10/03/2015, 3:01 PM

## 2015-10-03 NOTE — Progress Notes (Signed)
Subjective: 2 Days Post-Op Procedure(s) (LRB): Right Below Knee Amputation (Right) Patient reports pain as mild.  No complaints.   Objective: Vital signs in last 24 hours: Temp:  [97.6 F (36.4 C)-98.6 F (37 C)] 98.6 F (37 C) (03/17 1300) Pulse Rate:  [64-67] 64 (03/17 1300) Resp:  [16] 16 (03/17 1300) BP: (115-131)/(62-83) 131/83 mmHg (03/17 1300) SpO2:  [96 %-100 %] 100 % (03/17 1300)  Intake/Output from previous day: 03/16 0701 - 03/17 0700 In: 960 [P.O.:960] Out: 1000 [Urine:1000] Intake/Output this shift: Total I/O In: -  Out: 300 [Urine:300]   Recent Labs  10/01/15 1104  HGB 9.3*    Recent Labs  10/01/15 1104  WBC 17.6*  RBC 3.94*  HCT 29.3*  PLT 199    Recent Labs  10/01/15 1104  NA 143  K 3.6  CL 108  CO2 22  BUN 22*  CREATININE 1.26*  GLUCOSE 102*  CALCIUM 8.4*    Recent Labs  10/01/15 1104  INR 1.26    Compartment soft  Right leg dressing intact wound vac in place  Assessment/Plan: 2 Days Post-Op Procedure(s) (LRB): Right Below Knee Amputation (Right) Discharge to SNF today.   Caleb Hays, Caleb Hays 10/03/2015, 4:03 PM

## 2015-10-06 ENCOUNTER — Encounter: Payer: Self-pay | Admitting: Internal Medicine

## 2015-10-06 ENCOUNTER — Non-Acute Institutional Stay (SKILLED_NURSING_FACILITY): Payer: Medicare HMO | Admitting: Internal Medicine

## 2015-10-06 DIAGNOSIS — R195 Other fecal abnormalities: Secondary | ICD-10-CM

## 2015-10-06 DIAGNOSIS — N189 Chronic kidney disease, unspecified: Secondary | ICD-10-CM | POA: Diagnosis not present

## 2015-10-06 DIAGNOSIS — M869 Osteomyelitis, unspecified: Secondary | ICD-10-CM | POA: Diagnosis not present

## 2015-10-06 DIAGNOSIS — R5381 Other malaise: Secondary | ICD-10-CM | POA: Diagnosis not present

## 2015-10-06 DIAGNOSIS — J42 Unspecified chronic bronchitis: Secondary | ICD-10-CM | POA: Diagnosis not present

## 2015-10-06 DIAGNOSIS — K219 Gastro-esophageal reflux disease without esophagitis: Secondary | ICD-10-CM

## 2015-10-06 DIAGNOSIS — R2681 Unsteadiness on feet: Secondary | ICD-10-CM

## 2015-10-06 DIAGNOSIS — R030 Elevated blood-pressure reading, without diagnosis of hypertension: Secondary | ICD-10-CM

## 2015-10-06 DIAGNOSIS — D62 Acute posthemorrhagic anemia: Secondary | ICD-10-CM | POA: Diagnosis not present

## 2015-10-06 DIAGNOSIS — F329 Major depressive disorder, single episode, unspecified: Secondary | ICD-10-CM | POA: Diagnosis not present

## 2015-10-06 DIAGNOSIS — IMO0001 Reserved for inherently not codable concepts without codable children: Secondary | ICD-10-CM

## 2015-10-06 DIAGNOSIS — E43 Unspecified severe protein-calorie malnutrition: Secondary | ICD-10-CM | POA: Diagnosis not present

## 2015-10-06 NOTE — Progress Notes (Signed)
[facility]  PCP: Egbert GaribaldiMillsaps, [name], NP   Code Status: Full Code  Goals of care: Advanced Directive information Advanced Directives 10/01/2015  Does patient have an advance directive? No  Would patient like information on creating an advanced directive? -       Extended Emergency Contact Information Primary Emergency Contact: [name],[name] Address: [address]          [place], [place] [address] [place] Home Phone: [phone] Mobile Phone: [phone] Relation: Niece Secondary Emergency Contact: Roena MaladyParis,Ernie  [place] Home Phone: [phone] Relation: Friend   No Known Allergies  Chief Complaint  Patient presents with  . New Admit To SNF    New Admission     HPI:  Patient is a 75 y.o. 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 He is seen in his room today. His pain is under control. He complaints of heartburn. No other concerns.   Review of Systems:  Constitutional: Negative for fever, chills, malaise. Energy level slowly returning.  HENT: Negative for headache, congestion, nasal discharge, hearing loss, sore throat, difficulty swallowing.   Eyes: Negative for blurred vision, double vision and discharge.  Respiratory: Negative for cough, shortness of breath and wheezing.   Cardiovascular: Negative for chest pain, palpitations, leg swelling.  Gastrointestinal: Negative for nausea, vomiting, abdominal pain, melena, diarrhea and constipation. Positive for heartburn and poor appetite. Last bowel movement was last night. Had 3 loose stools yesterday.  Genitourinary: Negative for dysuria and flank pain.  Musculoskeletal: Negative for back pain, fall in the facility.  Skin: Negative for itching, rash.  Neurological: Negative for dizziness. Positive for weakness. Psychiatric/Behavioral: Negative for  anxiety, memory loss. Positive for some depression and interrupted sleep.   Past Medical History  Diagnosis Date  . Gout   . COPD (chronic obstructive pulmonary disease) (HCC)     LONG TIME SMOKER  . 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 Lajoyce Cornersuda   . Critical lower limb ischemia   . Pyelonephritis 06/23/2015  . Arthritis     "hands" (06/23/2015)  . Depression     "periods of depression" (06/23/2015)  . Adult failure to thrive     /notes 06/23/2015  . Physical deconditioning    Past Surgical History  Procedure Laterality Date  . Knee cartilage surgery Left 1960's    football injury  . Amputation Right 03/01/2014    Procedure: AMPUTATION RAY;  Surgeon: Nadara MustardMarcus Duda V, MD;  [facility];  Service: Orthopedics;  Laterality: Right;  Right Foot 5th Ray Amputation  . Amputation Right 04/26/2014    Procedure: Right Foot 4th Ray Amputation;  Surgeon: Nadara MustardMarcus Duda V, MD;  [facility];  Service: Orthopedics;  Laterality: Right;  . Lower extremity angiogram N/A 05/27/2014    Procedure: LOWER EXTREMITY ANGIOGRAM;  Surgeon: Runell GessJonathan J Berry, MD;  [facility];  Service: Cardiovascular;  Laterality: N/A;  . Inguinal hernia repair Left   . Knee ligament reconstruction Left 1960's  . Cataract extraction w/ intraocular lens  implant, bilateral Bilateral   . Ankle arthroscopy Right 08/13/2015    Procedure: ANKLE ARTHROSCOPY;  Surgeon: Nadara MustardMarcus Duda V, MD;  [facility];  Service: Orthopedics;  Laterality: Right;  . Amputation Right 10/01/2015    Procedure: Right Below Knee Amputation;  Surgeon: Nadara MustardMarcus Duda V, MD;  [facility];  Service: Orthopedics;  Laterality: Right;   Social History:   reports that he has been smoking Cigarettes.  He has a 14.25 pack-year smoking history. He has never used smokeless tobacco. He reports that he drinks alcohol. He reports that he does not use illicit drugs.  Family History  Problem Relation Age of Onset  . Heart failure Father    . Heart attack Father   . Heart failure Brother     Medications:   Medication List       This list is accurate as of: 10/06/15  2:27 PM.  Always use your most recent med list.               albuterol 108 (90 Base) MCG/ACT inhaler  Commonly known as:  PROVENTIL HFA;VENTOLIN HFA  Inhale 2 puffs into the lungs 2 (two) times daily. Take every daily per niece     colchicine 0.6 MG tablet  Take 0.6 mg by mouth 2 (two) times daily.     colchicine 0.6 MG tablet  Take 1 tablet (0.6 mg total) by mouth 2 (two) times daily.     docusate sodium 100 MG capsule  Commonly known as:  COLACE  Take 1 capsule (100 mg total) by mouth 2 (two) times daily.     meloxicam 7.5 MG tablet  Commonly known as:  MOBIC  Take 7.5 mg by mouth daily.     methocarbamol 500 MG tablet  Commonly known as:  ROBAXIN  Take 500 mg by mouth every 6 (six) hours as needed for muscle spasms.     oxyCODONE-acetaminophen 5-325 MG tablet  Commonly known as:  PERCOCET/ROXICET  Take 1 tablet by mouth every 4 (four) hours as needed for moderate pain or severe pain.     saccharomyces boulardii 250 MG capsule  Commonly known as:  FLORASTOR  Take 1 capsule (250 mg total) by mouth 2 (two) times daily.     senna 8.6 MG Tabs tablet  Commonly known as:  SENOKOT  Take 1 tablet (8.6 mg total) by mouth 2 (two) times daily.     traZODone 50 MG tablet  Commonly known as:  DESYREL  Take 25 mg by mouth at bedtime.     UNABLE TO FIND  Med Name: Med pass 120 mL three times daily with meals        Immunizations: Immunization History  Administered Date(s) Administered  . Influenza,inj,Quad PF,36+ Mos 06/25/2015  . PPD Test 10/03/2015  . Pneumococcal Polysaccharide-23 06/25/2015     Physical Exam: Filed Vitals:   10/06/15 1415  BP: 131/92  Pulse: 62  Temp: 98.2 F (36.8 C)  TempSrc: Oral  Resp: 17  Height:  (1.93 m)  Weight: 168 lb (76.204 kg)  SpO2: 96%   Body mass index is 20.46  kg/(m^2).  General- elderly frail and thin built male, in no acute distress Head- normocephalic, atraumatic Nose- no maxillary or frontal sinus tenderness, no nasal discharge Throat- moist mucus membrane, poor dentition  Eyes- PERRLA, EOMI, no pallor, no icterus, no discharge, normal conjunctiva, normal sclera Neck- no cervical lymphadenopathy Cardiovascular- normal s1,s2, no murmur, trace left leg edema Respiratory- bilateral clear to auscultation, no wheeze, no rhonchi, no crackles, no use of accessory muscles Abdomen- bowel sounds present, soft, non tender Musculoskeletal- able to move all 4 extremities, right BKA with dressing in place Neurological- alert and oriented to person, place and time Skin- warm and dry, right BKA site with staples and healing well Psychiatry- normal mood and affect  Labs reviewed: Basic Metabolic Panel:  Recent Labs  16/10/96 0830 08/16/15 0349 10/01/15 10/01/15 1104  NA 140 138 143 143  K 4.5 4.0  --  3.6  CL 107 106  --  108  CO2 24 25  --  22  GLUCOSE 92 85  --  102*  BUN 12 8 22* 22*  CREATININE 1.22 1.22 1.3 1.26*  CALCIUM 8.2* 8.4*  --  8.4*   Liver Function Tests:  Recent Labs  08/05/15 1505 08/13/15 1232 10/01/15 1104  AST 23 14* 17  ALT 11* 12* 15*  ALKPHOS 67 77 113  BILITOT 0.4 0.5 0.7  PROT 6.2* 5.7* 5.8*  ALBUMIN 3.2* 2.4* 2.5*   No results for input(s): LIPASE, AMYLASE in the last 8760 hours. No results for input(s): AMMONIA in the last 8760 hours. CBC:  Recent Labs  08/09/15 2251  08/11/15 0554 08/13/15 1232 08/14/15 0830 10/01/15 1104  WBC 9.0  < > 7.7 12.3* 8.5 17.6*  NEUTROABS 6.6  --  4.7 9.1*  --   --   HGB 9.2*  < > 8.3* 9.9* 8.7* 9.3*  HCT 29.3*  < > 26.1* 31.4* 28.1* 29.3*  MCV 74.9*  < > 75.0* 74.9* 76.4* 74.4*  PLT 331  < > 287 304 257 199  < > = values in this interval not displayed. Cardiac Enzymes:  Recent Labs  06/24/15 0337 06/25/15 0316 08/10/15 0820  CKTOTAL 545* 199 21*      Assessment/Plan  Physical deconditioning Will have patient work with PT/OT as tolerated to regain strength and restore function.  Fall precautions are in place.  Unsteady gait  Post right BKA. Will have him work with physical therapy and occupational therapy team to help with gait training and muscle strengthening exercises.fall precautions. Skin care. Encourage to be out of bed.   Right ankle osteomyelitis Underwent transtibial amputation. Continue oxycodone APAP 5-325 mg 1 tab q4h prn pain with robaxin 500 mg q8h prn muscle spasm. Will have him work with therapy team for training for gait and balance with safe transfers. Continue wound care. To be followed by Dr Lajoyce Corners in his office.   CKD Of unspecified stage. Monitor bmp  Blood loss anemia Post op, check cbc  Protein calorie malnutrition Get dietary consult. Continue medpass tid for now. Monitor weekly weight  Loose stool On colace 100 mg bid and senna bid. Discontinue colace and continue senna. Continue florastor.   gerd Start famotidine 20 mg daily and monitor  Depression Continue trazodone   COPD Continue prn albuterol, stable breathing at present  Elevated BP No known diagnosis of HTN in chart. Has history of PVD. Start bp check bid x 1 week and reassess    Goals of care: short term rehabilitation   Labs/tests ordered: cbc, cmp  Family/ staff Communication: reviewed care plan with patient and nursing supervisor    Oneal Grout, MD Internal Medicine [facility] [address] [place], [place] [address] Cell Phone (Monday-Friday 8 am - 5 pm): [phone] On Call: [phone] and follow prompts after 5 pm and on weekends Office Phone: [phone] Office Fax: [phone]

## 2015-10-07 LAB — HEPATIC FUNCTION PANEL
ALK PHOS: 85 U/L (ref 25–125)
ALT: 5 U/L — AB (ref 10–40)
AST: 10 U/L — AB (ref 14–40)
BILIRUBIN, TOTAL: 0.5 mg/dL

## 2015-10-07 LAB — CBC AND DIFFERENTIAL
HCT: 27 % — AB (ref 41–53)
Hemoglobin: 8.1 g/dL — AB (ref 13.5–17.5)
PLATELETS: 204 10*3/uL (ref 150–399)
WBC: 10.5 10^3/mL

## 2015-10-07 LAB — BASIC METABOLIC PANEL
BUN: 13 mg/dL (ref 4–21)
CREATININE: 1.1 mg/dL (ref 0.6–1.3)
GLUCOSE: 72 mg/dL
Potassium: 4.6 mmol/L (ref 3.4–5.3)
Sodium: 138 mmol/L (ref 137–147)

## 2015-10-09 ENCOUNTER — Non-Acute Institutional Stay: Payer: Medicare HMO | Admitting: Family

## 2015-10-09 DIAGNOSIS — K219 Gastro-esophageal reflux disease without esophagitis: Secondary | ICD-10-CM | POA: Diagnosis not present

## 2015-10-09 DIAGNOSIS — R638 Other symptoms and signs concerning food and fluid intake: Secondary | ICD-10-CM

## 2015-10-09 DIAGNOSIS — F329 Major depressive disorder, single episode, unspecified: Secondary | ICD-10-CM

## 2015-10-09 DIAGNOSIS — R197 Diarrhea, unspecified: Secondary | ICD-10-CM

## 2015-10-09 DIAGNOSIS — F32A Depression, unspecified: Secondary | ICD-10-CM

## 2015-10-09 NOTE — Progress Notes (Signed)
Patient ID: Caleb Hays, male   DOB: Jun 16, 1941, 75 y.o.   MRN: [id number]  Location:  [facility]   Place of Service:  SNF [phone]) Provider: Oneal Grout, MD   Egbert Garibaldi, NP  Patient Care Team: Marva Panda, NP as PCP - General Runell Gess, MD as Consulting Physician (Cardiology) Nadara Mustard, MD as Consulting Physician (Orthopedic Surgery)  Extended Emergency Contact Information Primary Emergency Contact: [name],[name] Address: [address]          [place], [place] [address] [place] Home Phone: [phone] Mobile Phone: [phone] Relation: Niece Secondary Emergency Contact: [name] Home Phone: [phone] Relation: Friend  Code Status: Full Code  Goals of care: Advanced Directive information Advanced Directives 10/01/2015  Does patient have an advance directive? No  Would patient like information on creating an advanced directive? -     Chief Complaint  Patient presents with  . Acute Visit    Depression     HPI:  Pt is a 75 y.o. male [facility]  for an acute visit for evaluation of depression. He is here for shot term Rehabilitation post Right BKA 10/01/2015. He is seen in his room today per facility nurse request.Facility Nurse reports patient has refused pain medication, hydration and meals, request lights off and blinds closed.Also states patient puts cover over over his head. Patient states feeling depressed given his recent right BKA. He also reports 2-3 episodes of diarrhea per day. He states has had no appetite and trying to adjust to facility diet. He also complains of acid reflux. He was started on famotidine 10/06/2015.    Past Medical History  Diagnosis Date  . Gout   . COPD (chronic obstructive pulmonary disease) (HCC)     LONG TIME SMOKER  . 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 Lajoyce Corners   . Critical lower limb  ischemia   . Pyelonephritis 06/23/2015  . Arthritis     "hands" (06/23/2015)  . Depression     "periods of depression" (06/23/2015)  . Adult failure to thrive     /notes 06/23/2015  . Physical deconditioning    Past Surgical History  Procedure Laterality Date  . Knee cartilage surgery Left 1960's    football injury  . Amputation Right 03/01/2014    Procedure: AMPUTATION RAY;  Surgeon: Nadara Mustard, MD;  Location: [facility];  Service: Orthopedics;  Laterality: Right;  Right Foot 5th Ray Amputation  . Amputation Right 04/26/2014    Procedure: Right Foot 4th Ray Amputation;  Surgeon: Nadara Mustard, MD;  Location[facility];  Service: Orthopedics;  Laterality: Right;  . Lower extremity angiogram N/A 05/27/2014    Procedure: LOWER EXTREMITY ANGIOGRAM;  Surgeon: Runell Gess, MD;  Location: [facility] CATH LAB;  Service: Cardiovascular;  Laterality: N/A;  . Inguinal hernia repair Left   . Knee ligament reconstruction Left 1960's  . Cataract extraction w/ intraocular lens  implant, bilateral Bilateral   . Ankle arthroscopy Right 08/13/2015    Procedure: ANKLE ARTHROSCOPY;  Surgeon: Nadara Mustard, MD;  [facility];  Service: Orthopedics;  Laterality: Right;  . Amputation Right 10/01/2015    Procedure: Right Below Knee Amputation;  Surgeon: Nadara Mustard, MD;  Location: [facility] OR;  Service: Orthopedics;  Laterality: Right;    No Known Allergies    Medication List  This list is accurate as of: 10/09/15  3:44 PM.  Always use your most recent med list.               albuterol 108 (90 Base) MCG/ACT inhaler  Commonly known as:  PROVENTIL HFA;VENTOLIN HFA  Inhale 2 puffs into the lungs 2 (two) times daily. Take every daily per niece     colchicine 0.6 MG tablet  Take 0.6 mg by mouth 2 (two) times daily.     colchicine 0.6 MG tablet  Take 1 tablet (0.6 mg total) by mouth 2 (two) times daily.     famotidine 20 MG tablet  Commonly known as:  PEPCID  Take 20 mg by mouth 2 (two) times daily.      meloxicam 7.5 MG tablet  Commonly known as:  MOBIC  Take 7.5 mg by mouth daily.     methocarbamol 500 MG tablet  Commonly known as:  ROBAXIN  Take 500 mg by mouth every 6 (six) hours as needed for muscle spasms.     multivitamin with minerals tablet  Take 1 tablet by mouth daily.     oxyCODONE-acetaminophen 5-325 MG tablet  Commonly known as:  PERCOCET/ROXICET  Take 1 tablet by mouth every 4 (four) hours as needed for moderate pain or severe pain.     saccharomyces boulardii 250 MG capsule  Commonly known as:  FLORASTOR  Take 1 capsule (250 mg total) by mouth 2 (two) times daily.     senna 8.6 MG Tabs tablet  Commonly known as:  SENOKOT  Take 1 tablet (8.6 mg total) by mouth 2 (two) times daily.     traZODone 50 MG tablet  Commonly known as:  DESYREL  Take 25 mg by mouth at bedtime.     UNABLE TO FIND  Med Name: Med pass 120 mL three times daily with meals        Review of Systems  Constitutional: Negative for fever, chills, activity change and fatigue.  HENT: Negative for congestion, rhinorrhea, sinus pressure, sneezing and sore throat.   Eyes: Negative.   Respiratory: Negative for cough, chest tightness, shortness of breath, wheezing and stridor.   Cardiovascular: Negative for chest pain, palpitations and leg swelling.  Gastrointestinal: Positive for diarrhea. Negative for nausea, vomiting, abdominal pain, constipation and abdominal distention.  Genitourinary: Negative.   Musculoskeletal: Positive for gait problem.       Right BKA   Skin: Negative.   Hematological: Negative.   Psychiatric/Behavioral: Negative for suicidal ideas, behavioral problems, confusion, sleep disturbance and agitation.       Depressed     Immunization History  Administered Date(s) Administered  . Influenza,inj,Quad PF,36+ Mos 06/25/2015  . PPD Test 10/03/2015  . Pneumococcal Polysaccharide-23 06/25/2015   Pertinent  Health Maintenance Due  Topic Date Due  . COLONOSCOPY  03/04/1992  .  INFLUENZA VACCINE  02/17/2016  . PNA vac Low Risk Adult (2 of 2 - PCV13) 06/24/2016   No flowsheet data found. Functional Status Survey:    Filed Vitals:   10/09/15 1528  BP: 130/64  Pulse: 76  Temp: 98.3 F (36.8 C)  Resp: 18  Height:  (1.93 m)  Weight: 165 lb (74.844 kg)  SpO2: 98%   Body mass index is 20.09 kg/(m^2). Physical Exam  Constitutional: He is oriented to person, place, and time. He appears well-developed. No distress.  Frail Elderly in no acute distress. In bed with lights off, door and blinds closed.   HENT:  Head: Normocephalic.  Eyes: Conjunctivae and EOM are normal. Pupils are equal, round, and reactive to light. Right eye exhibits no discharge. Left eye exhibits no discharge. No scleral icterus.  Neck: Normal range of motion.  Cardiovascular: Normal rate, regular rhythm, normal heart sounds and intact distal pulses.  Exam reveals no gallop and no friction rub.   No murmur heard. Pulmonary/Chest: Effort normal and breath sounds normal. No respiratory distress. He has no wheezes. He has no rales.  Abdominal: Soft. Bowel sounds are normal. He exhibits no distension and no mass. There is no tenderness. There is no rebound and no guarding.  Musculoskeletal: He exhibits no edema or tenderness.  Right BKA drsg dry clean and intact.   Neurological: He is oriented to person, place, and time.  Skin: Skin is warm and dry. No rash noted. No erythema. No pallor.  Psychiatric:  Depressed mood.     Labs reviewed:  Recent Labs  08/14/15 0830 08/16/15 0349 10/01/15 10/01/15 1104  NA 140 138 143 143  K 4.5 4.0  --  3.6  CL 107 106  --  108  CO2 24 25  --  22  GLUCOSE 92 85  --  102*  BUN 12 8 22* 22*  CREATININE 1.22 1.22 1.3 1.26*  CALCIUM 8.2* 8.4*  --  8.4*    Recent Labs  08/05/15 1505 08/13/15 1232 10/01/15 1104  AST 23 14* 17  ALT 11* 12* 15*  ALKPHOS 67 77 113  BILITOT 0.4 0.5 0.7  PROT 6.2* 5.7* 5.8*  ALBUMIN 3.2* 2.4* 2.5*    Recent  Labs  08/09/15 2251  08/11/15 0554 08/13/15 1232 08/14/15 0830 10/01/15 1104  WBC 9.0  < > 7.7 12.3* 8.5 17.6*  NEUTROABS 6.6  --  4.7 9.1*  --   --   HGB 9.2*  < > 8.3* 9.9* 8.7* 9.3*  HCT 29.3*  < > 26.1* 31.4* 28.1* 29.3*  MCV 74.9*  < > 75.0* 74.9* 76.4* 74.4*  PLT 331  < > 287 304 257 199  < > = values in this interval not displayed. No results found for: TSH No results found for: HGBA1C No results found for: CHOL, HDL, LDLCALC, LDLDIRECT, TRIG, CHOLHDL  Significant Diagnostic Results in last 30 days:  No results found.  Assessment/Plan 1. Depression S/p Right BKA will start  Remeron 15 mg Tablet one by mouth at bedtime. Psychotherapist to evaluate.   2. Diarrhea, unspecified type Afebrile. Will obtain stool for C-diff. Imodium PRN for loose stool.  3. GERD Famotidine recently started. Continue to monitor.   4.Decreased oral intake Remeron 15 mg Tablet at bedtime for appetite stimulation. Has had nutrition consult.     Family/ staff Communication:Reviwed plan with patient and facility Nurse   Labs/tests ordered: None

## 2015-10-13 LAB — TSH
TSH: 5.64 u[IU]/mL (ref 0.41–5.90)
TSH: 5.64 u[IU]/mL (ref <=5.90)

## 2015-10-15 ENCOUNTER — Non-Acute Institutional Stay (SKILLED_NURSING_FACILITY): Payer: Medicare HMO | Admitting: Family

## 2015-10-15 DIAGNOSIS — R7989 Other specified abnormal findings of blood chemistry: Secondary | ICD-10-CM | POA: Diagnosis not present

## 2015-10-15 DIAGNOSIS — E559 Vitamin D deficiency, unspecified: Secondary | ICD-10-CM

## 2015-10-15 NOTE — Progress Notes (Signed)
Patient ID: Caleb Hays, male   DOB: 02/22/1941, 75 y.o.   MRN: [id number]  Location:  [facility] Room Number: 702-P Place of Service:  SNF [phone] Provider: Timmie FoersterMahiama Pandey, MD   Egbert GaribaldiMillsaps, [name], NP  Patient Care Team: Marva PandaKimberly Millsaps, NP as PCP - General Runell GessJonathan J Berry, MD as Consulting Physician (Cardiology) Nadara MustardMarcus Duda V, MD as Consulting Physician (Orthopedic Surgery)  Extended Emergency Contact Information Primary Emergency Contact: [name],[name] Address: [address]          [place], [place] [address] [place] Home Phone: [phone] Mobile Phone: [phone] Relation: Niece Secondary Emergency Contact: Roena MaladyParis,Ernie  [place] Home Phone: [phone] Relation: Friend  Code Status: Full Code  Goals of care: Advanced Directive information Advanced Directives 10/15/2015  Does patient have an advance directive? No  Would patient like information on creating an advanced directive? No - patient declined information     Chief Complaint  Patient presents with  . Acute Visit    HPI:  Pt is a 75 y.o. male seen today at [facility] for an acute visit for evaluation of abnormal labs results.He has a medical history of  Right  BKA COPD, PVD, Gout, Depression, Failure to thrive among others. He is seen in his room today. He denies any acute issues.He states his appetite has improved since started on Remeron.Facility staff states patient's depression has improved participating with care. His recent TSH level 5.644 and Vit D 6.76 ( 10/13/2015).      Past Medical History  Diagnosis Date  . Gout   . COPD (chronic obstructive pulmonary disease) (HCC)     LONG TIME SMOKER  . 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 Lajoyce Cornersuda   . Critical lower limb ischemia   . Pyelonephritis 06/23/2015  . Arthritis     "hands" (06/23/2015)  . Depression     "periods of depression" (06/23/2015)    . Adult failure to thrive     /notes 06/23/2015  . Physical deconditioning    Past Surgical History  Procedure Laterality Date  . Knee cartilage surgery Left 1960's    football injury  . Amputation Right 03/01/2014    Procedure: AMPUTATION RAY;  Surgeon: Nadara MustardMarcus Duda V, MD;  [facility];  Service: Orthopedics;  Laterality: Right;  Right Foot 5th Ray Amputation  . Amputation Right 04/26/2014    Procedure: Right Foot 4th Ray Amputation;  Surgeon: Nadara MustardMarcus Duda V, MD;  [facility];  Service: Orthopedics;  Laterality: Right;  . Lower extremity angiogram N/A 05/27/2014    Procedure: LOWER EXTREMITY ANGIOGRAM;  Surgeon: Runell GessJonathan J Berry, MD;  [facility];  Service: Cardiovascular;  Laterality: N/A;  . Inguinal hernia repair Left   . Knee ligament reconstruction Left 1960's  . Cataract extraction w/ intraocular lens  implant, bilateral Bilateral   . Ankle arthroscopy Right 08/13/2015    Procedure: ANKLE ARTHROSCOPY;  Surgeon: Nadara MustardMarcus Duda V, MD;  [facility];  Service: Orthopedics;  Laterality: Right;  . Amputation Right 10/01/2015    Procedure: Right Below Knee Amputation;  Surgeon: Nadara MustardMarcus Duda V, MD;  [facility];  Service: Orthopedics;  Laterality: Right;    No Known Allergies    Medication List       This list is accurate as of: 10/15/15  1:43 PM.  Always use your most recent med list.  albuterol 108 (90 Base) MCG/ACT inhaler  Commonly known as:  PROVENTIL HFA;VENTOLIN HFA  Inhale 2 puffs into the lungs 2 (two) times daily. Take every daily per niece     colchicine 0.6 MG tablet  Take 0.6 mg by mouth 2 (two) times daily.     famotidine 20 MG tablet  Commonly known as:  PEPCID  Take 20 mg by mouth 2 (two) times daily. For GERD.     meloxicam 7.5 MG tablet  Commonly known as:  MOBIC  Take 7.5 mg by mouth daily.     methocarbamol 500 MG tablet  Commonly known as:  ROBAXIN  Take 500 mg by mouth every 6 (six) hours as needed for muscle spasms.      multivitamin with minerals tablet  Take 1 tablet by mouth daily.     oxyCODONE-acetaminophen 5-325 MG tablet  Commonly known as:  PERCOCET/ROXICET  Take 1 tablet by mouth every 4 (four) hours as needed for moderate pain or severe pain.     saccharomyces boulardii 250 MG capsule  Commonly known as:  FLORASTOR  Take 1 capsule (250 mg total) by mouth 2 (two) times daily.     senna 8.6 MG Tabs tablet  Commonly known as:  SENOKOT  Take 1 tablet (8.6 mg total) by mouth 2 (two) times daily.     traZODone 50 MG tablet  Commonly known as:  DESYREL  Take 25 mg by mouth at bedtime as needed for sleep.     UNABLE TO FIND  Med Name: Med pass 120 mL three times daily with meals        Review of Systems  Constitutional: Negative for fever, chills, activity change, appetite change and fatigue.       Appetite has improved.   HENT: Negative.   Eyes: Negative.   Respiratory: Negative.   Cardiovascular: Negative for chest pain, palpitations and leg swelling.  Gastrointestinal: Negative for nausea, vomiting, abdominal pain, diarrhea, constipation and abdominal distention.  Endocrine: Negative for cold intolerance, heat intolerance, polydipsia, polyphagia and polyuria.  Genitourinary: Negative.   Musculoskeletal: Positive for gait problem.  Skin: Negative.   Allergic/Immunologic: Negative.   Neurological: Negative.   Psychiatric/Behavioral: Negative.     Immunization History  Administered Date(s) Administered  . Influenza,inj,Quad PF,36+ Mos 06/25/2015  . PPD Test 10/03/2015  . Pneumococcal Polysaccharide-23 06/25/2015   Pertinent  Health Maintenance Due  Topic Date Due  . COLONOSCOPY  03/04/1992  . INFLUENZA VACCINE  02/17/2016  . PNA vac Low Risk Adult (2 of 2 - PCV13) 06/24/2016   No flowsheet data found. Functional Status Survey:    Filed Vitals:   10/15/15 1058  BP: 133/61  Pulse: 65  Temp: 98.3 F (36.8 C)  TempSrc: Oral  Resp: 19  Height:  (1.93 m)   Weight: 134 lb 1.6 oz (60.827 kg)  SpO2: 95%   Body mass index is 16.33 kg/(m^2). Physical Exam  Constitutional: He is oriented to person, place, and time.  Thin Frail Elderly   HENT:  Head: Normocephalic.  Mouth/Throat: Oropharynx is clear and moist.  Eyes: Conjunctivae and EOM are normal. Pupils are equal, round, and reactive to light. Right eye exhibits no discharge. Left eye exhibits no discharge. No scleral icterus.  Neck: Normal range of motion. No JVD present. No thyromegaly present.  Cardiovascular: Normal rate, regular rhythm, normal heart sounds and intact distal pulses.  Exam reveals no gallop and no friction rub.   No murmur heard. Pulmonary/Chest: Effort normal and  breath sounds normal. No respiratory distress. He has no wheezes. He has no rales.  Abdominal: Soft. Bowel sounds are normal. He exhibits no distension and no mass. There is no tenderness. There is no rebound and no guarding.  Musculoskeletal: Normal range of motion. He exhibits no edema or tenderness.  Right BKA incision site Drsg dry, clean and intact.   Lymphadenopathy:    He has no cervical adenopathy.  Neurological: He is oriented to person, place, and time.  Skin: Skin is warm and dry. No rash noted. No erythema. No pallor.  Psychiatric: He has a normal mood and affect.    Labs reviewed:  Recent Labs  08/14/15 0830 08/16/15 0349 10/01/15 10/01/15 1104  NA 140 138 143 143  K 4.5 4.0  --  3.6  CL 107 106  --  108  CO2 24 25  --  22  GLUCOSE 92 85  --  102*  BUN 12 8 22* 22*  CREATININE 1.22 1.22 1.3 1.26*  CALCIUM 8.2* 8.4*  --  8.4*    Recent Labs  08/05/15 1505 08/13/15 1232 10/01/15 1104  AST 23 14* 17  ALT 11* 12* 15*  ALKPHOS 67 77 113  BILITOT 0.4 0.5 0.7  PROT 6.2* 5.7* 5.8*  ALBUMIN 3.2* 2.4* 2.5*    Recent Labs  08/09/15 2251  08/11/15 0554 08/13/15 1232 08/14/15 0830 10/01/15 1104  WBC 9.0  < > 7.7 12.3* 8.5 17.6*  NEUTROABS 6.6  --  4.7 9.1*  --   --   HGB 9.2*   < > 8.3* 9.9* 8.7* 9.3*  HCT 29.3*  < > 26.1* 31.4* 28.1* 29.3*  MCV 74.9*  < > 75.0* 74.9* 76.4* 74.4*  PLT 331  < > 287 304 257 199  < > = values in this interval not displayed. Lab Results  Component Value Date   TSH 5.64 10/13/2015   No results found for: HGBA1C No results found for: CHOL, HDL, LDLCALC, LDLDIRECT, TRIG, CHOLHDL  Significant Diagnostic Results in last 30 days:  No results found.  Assessment/Plan 1. Vitamin D deficiency Vit D level 6.76 (10/13/2015) will initiate Vit D 50,000 units once weekly X 6 weeks then decrease to vit D 1000 units once daily. Monitor vit D level.   2. Abnormal thyroid stimulating hormone (TSH) level Recent TSH level 5.644 ( 10/13/2015). Recheck TSH level, T4 and T3 level 10/16/2015    Family/ staff Communication: Reviewed plan of care with patient and facility Nurse supervisor.   Labs/tests ordered:  TSH level, T4 and T3 level 10/16/2015

## 2015-11-03 ENCOUNTER — Encounter: Payer: Self-pay | Admitting: Family

## 2015-11-03 ENCOUNTER — Non-Acute Institutional Stay (SKILLED_NURSING_FACILITY): Payer: Medicare HMO | Admitting: Family

## 2015-11-03 DIAGNOSIS — T8189XD Other complications of procedures, not elsewhere classified, subsequent encounter: Secondary | ICD-10-CM | POA: Diagnosis not present

## 2015-11-03 NOTE — Progress Notes (Signed)
Location:  [facility] Room Number: [id number] Place of Service:  SNF [phone]) Provider:  Richarda Blade , FNP-C Oneal Grout, MD   Egbert Garibaldi, NP  Patient Care Team: Marva Panda, NP as PCP - General Runell Gess, MD as Consulting Physician (Cardiology) Nadara Mustard, MD as Consulting Physician (Orthopedic Surgery)  Extended Emergency Contact Information Primary Emergency Contact: [name],[name] Address: [address]          [place], [place] [address] [place] Home Phone: [phone] Mobile Phone: [phone] Relation: Niece Secondary Emergency Contact: [name] Home Phone: [phone] Relation: Friend  Code Status:  Full Code  Goals of care: Advanced Directive information Advanced Directives 10/15/2015  Does patient have an advance directive? No  Would patient like information on creating an advanced directive? No - patient declined information     Chief Complaint  Patient presents with  . Acute Visit    acute concerns    HPI:  Pt is a 75 y.o. male seen today at [facility] for an acute visit for of right stump wound. He has a medical history of COPD, Arthritis, Depression among others. He is post Right BKA 09/2015. He is seen in his room today. He denies any acute issues.He is currently on Zyvox 600 mg Tablet Twice daily x 14 days for UTI started 11/02/2015. Facility wound nurse reports increased yellow drainage from incision site.    Past Medical History  Diagnosis Date  . Gout   . COPD (chronic obstructive pulmonary disease) (HCC)     LONG TIME SMOKER  . 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 Lajoyce Corners   . Critical lower limb ischemia   . Pyelonephritis 06/23/2015  . Arthritis     "hands" (06/23/2015)  . Depression     "periods of depression" (06/23/2015)  . Adult failure to thrive     /notes 06/23/2015  . Physical deconditioning    Past  Surgical History  Procedure Laterality Date  . Knee cartilage surgery Left 1960's    football injury  . Amputation Right 03/01/2014    Procedure: AMPUTATION RAY;  Surgeon: Nadara Mustard, MD;  Location: [facility] OR;  Service: Orthopedics;  Laterality: Right;  Right Foot 5th Ray Amputation  . Amputation Right 04/26/2014    Procedure: Right Foot 4th Ray Amputation;  Surgeon: Nadara Mustard, MD;  Location: [facility] OR;  Service: Orthopedics;  Laterality: Right;  . Lower extremity angiogram N/A 05/27/2014    Procedure: LOWER EXTREMITY ANGIOGRAM;  Surgeon: Runell Gess, MD;  Location: [facility] CATH LAB;  Service: Cardiovascular;  Laterality: N/A;  . Inguinal hernia repair Left   . Knee ligament reconstruction Left 1960's  . Cataract extraction w/ intraocular lens  implant, bilateral Bilateral   . Ankle arthroscopy Right 08/13/2015    Procedure: ANKLE ARTHROSCOPY;  Surgeon: Nadara Mustard, MD;  [facility];  Service: Orthopedics;  Laterality: Right;  . Amputation Right 10/01/2015    Procedure: Right Below Knee Amputation;  Surgeon: Nadara Mustard, MD;  Location: [facility] OR;  Service: Orthopedics;  Laterality: Right;    No Known Allergies    Medication List       This list is accurate as of: 11/03/15 12:46 PM.  Always use your most recent med list.               albuterol 108 (90 Base) MCG/ACT inhaler  Commonly known as:  PROVENTIL HFA;VENTOLIN HFA  Inhale 2 puffs into the lungs 2 (two) times daily. Take every daily per niece     cholecalciferol 1000 units tablet  Commonly known as:  VITAMIN D  Take 1,000 Units by mouth daily.     Vitamin D3 50000 units Caps  Take 1 capsule by mouth. Take once weekly x 8 weeks     famotidine 20 MG tablet  Commonly known as:  PEPCID  Take 20 mg by mouth daily. For GERD.     linezolid 600 MG tablet  Commonly known as:  ZYVOX  Take 600 mg by mouth. q12h x 14 days.     loperamide 2 MG tablet  Commonly known as:  IMODIUM A-D  Take 2 mg by mouth. Give 2 mg after  initial dose following each loose stool     meloxicam 7.5 MG tablet  Commonly known as:  MOBIC  Take 7.5 mg by mouth daily.     methocarbamol 500 MG tablet  Commonly known as:  ROBAXIN  Take 500 mg by mouth every 6 (six) hours as needed for muscle spasms.     mirtazapine 15 MG tablet  Commonly known as:  REMERON  Take 15 mg by mouth at bedtime.     multivitamin with minerals tablet  Take 1 tablet by mouth daily.     omeprazole 20 MG capsule  Commonly known as:  PRILOSEC  Take 20 mg by mouth daily.     oxyCODONE-acetaminophen 5-325 MG tablet  Commonly known as:  PERCOCET/ROXICET  Take 1 tablet by mouth every 4 (four) hours as needed for moderate pain or severe pain.     saccharomyces boulardii 250 MG capsule  Commonly known as:  FLORASTOR  Take 250 mg by mouth 2 (two) times daily.     senna 8.6 MG Tabs tablet  Commonly known as:  SENOKOT  Take 1 tablet (8.6 mg total) by mouth 2 (two) times daily.     traZODone 50 MG tablet  Commonly known as:  DESYREL  Take 25 mg by mouth at bedtime as needed for sleep.     UNABLE TO FIND  Med Name: Med pass 120 mL three times daily with meals        Review of Systems  Constitutional: Negative for fever, chills, activity change, appetite change and fatigue.  HENT: Negative for congestion, rhinorrhea, sinus pressure, sneezing and sore throat.   Eyes: Negative.   Respiratory: Negative for cough, chest tightness and wheezing.   Cardiovascular: Negative for chest pain, palpitations and leg swelling.  Genitourinary: Negative for urgency and flank pain.    Immunization History  Administered Date(s) Administered  . Influenza,inj,Quad PF,36+ Mos 06/25/2015  . Influenza-Unspecified 10/03/2015  . PPD Test 10/03/2015  . Pneumococcal Polysaccharide-23 06/25/2015  . Pneumococcal-Unspecified 10/03/2015   Pertinent  Health Maintenance Due  Topic Date Due  . COLONOSCOPY  03/04/1992  . INFLUENZA VACCINE  02/17/2016  . PNA vac Low Risk  Adult (2 of 2 - PCV13) 06/24/2016   No flowsheet data found. Functional Status Survey:    Filed Vitals:   11/03/15 1203  BP: 99/48  Pulse: 66  Temp: 97.5 F (36.4 C)  Resp: 20  Height: 6\' 4"  (1.93 m)  Weight: 134 lb 1.6 oz (60.827 kg)  SpO2: 99%   Body mass index is 16.33 kg/(m^2). Physical Exam  Constitutional: He is oriented to person, place, and time. He appears well-developed and well-nourished. No distress.  HENT:  Head:  Normocephalic.  Right Ear: External ear normal.  Left Ear: External ear normal.  Mouth/Throat: Oropharynx is clear and moist.  Eyes: Conjunctivae and EOM are normal. Pupils are equal, round, and reactive to light. Right eye exhibits no discharge. Left eye exhibits no discharge. No scleral icterus.  Neck: No JVD present.  Cardiovascular: Normal rate, regular rhythm, normal heart sounds and intact distal pulses.  Exam reveals no gallop and no friction rub.   No murmur heard. Pulmonary/Chest: Effort normal and breath sounds normal. No respiratory distress. He has no wheezes. He has no rales.  Musculoskeletal: He exhibits no edema or tenderness.  Right BKA   Lymphadenopathy:    He has no cervical adenopathy.  Neurological: He is oriented to person, place, and time.  Skin: Skin is warm and dry. No rash noted. He is not diaphoretic. No erythema.  Right BKA incision X 2 open areas draining yellow drainage. Strong odor noted. Surrounding tissue without any redness, swelling or tenderness to touch.    Psychiatric: He has a normal mood and affect.    Labs reviewed:  Recent Labs  08/14/15 0830 08/16/15 0349 10/01/15 10/01/15 1104 10/07/15  NA 140 138 143 143 138  K 4.5 4.0  --  3.6 4.6  CL 107 106  --  108  --   CO2 24 25  --  22  --   GLUCOSE 92 85  --  102*  --   BUN 12 8 22* 22* 13  CREATININE 1.22 1.22 1.3 1.26* 1.1  CALCIUM 8.2* 8.4*  --  8.4*  --     Recent Labs  08/05/15 1505 08/13/15 1232 10/01/15 1104 10/07/15  AST 23 14* 17 10*  ALT  11* 12* 15* 5*  ALKPHOS 67 77 113 85  BILITOT 0.4 0.5 0.7  --   PROT 6.2* 5.7* 5.8*  --   ALBUMIN 3.2* 2.4* 2.5*  --     Recent Labs  08/09/15 2251  08/11/15 0554 08/13/15 1232 08/14/15 0830 10/01/15 1104 10/07/15  WBC 9.0  < > 7.7 12.3* 8.5 17.6* 10.5  NEUTROABS 6.6  --  4.7 9.1*  --   --   --   HGB 9.2*  < > 8.3* 9.9* 8.7* 9.3* 8.1*  HCT 29.3*  < > 26.1* 31.4* 28.1* 29.3* 27*  MCV 74.9*  < > 75.0* 74.9* 76.4* 74.4*  --   PLT 331  < > 287 304 257 199 204  < > = values in this interval not displayed. Lab Results  Component Value Date   TSH 5.64 10/13/2015   TSH 5.64 10/13/2015   No results found for: HGBA1C No results found for: CHOL, HDL, LDLCALC, LDLDIRECT, TRIG, CHOLHDL  Significant Diagnostic Results in last 30 days:  No results found.  Assessment/Plan  Non-healing  Surgical Wound  S/p right BKA 09/2015.Afebrile. Currently on Zyvox 600 mg Tablet twice daily X 14 days for UTI.Has had increased purulent drainage. strong odor noted on exam. Seen 10/29/2015 by Burnett Sheng Dr. Lajoyce Corners next follow up visit in 2 weeks.will add Doxycycline 100 mg Tablet one by mouth twice daily x 10 days. CBC/diff 11/04/2015. Facility staff to Schedule follow up appointment with Ortho specialists ASAP. Continue wound care. Vital signs Bid.    Family/ staff Communication: Reviewed plan of care with patient and facility Nurse supervisor.   Labs/tests ordered: CBC/diff 11/04/2015

## 2015-11-06 ENCOUNTER — Non-Acute Institutional Stay: Payer: Medicare HMO | Admitting: Family

## 2015-11-06 ENCOUNTER — Other Ambulatory Visit: Payer: Self-pay | Admitting: *Deleted

## 2015-11-06 ENCOUNTER — Encounter: Payer: Self-pay | Admitting: Family

## 2015-11-06 DIAGNOSIS — F329 Major depressive disorder, single episode, unspecified: Secondary | ICD-10-CM

## 2015-11-06 DIAGNOSIS — J449 Chronic obstructive pulmonary disease, unspecified: Secondary | ICD-10-CM

## 2015-11-06 DIAGNOSIS — Z89511 Acquired absence of right leg below knee: Secondary | ICD-10-CM | POA: Diagnosis not present

## 2015-11-06 DIAGNOSIS — E039 Hypothyroidism, unspecified: Secondary | ICD-10-CM

## 2015-11-06 DIAGNOSIS — F32A Depression, unspecified: Secondary | ICD-10-CM

## 2015-11-06 DIAGNOSIS — E440 Moderate protein-calorie malnutrition: Secondary | ICD-10-CM

## 2015-11-06 MED ORDER — OXYCODONE-ACETAMINOPHEN 5-325 MG PO TABS
1.0000 | ORAL_TABLET | ORAL | Status: DC | PRN
Start: 2015-11-06 — End: 2015-11-11

## 2015-11-06 NOTE — Progress Notes (Signed)
Location:  [facility] Room Number: 702  Place of Service:  SNF [id number]) Provider:  Richarda Blade, FNP-C Timmie Foerster, MD   Egbert Garibaldi, NP  Patient Care Team: Marva Panda, NP as PCP - General Runell Gess, MD as Consulting Physician (Cardiology) Nadara Mustard, MD as Consulting Physician (Orthopedic Surgery)  Extended Emergency Contact Information Primary Emergency Contact: [name],[name] Address: [address]          [place], [place] [address] [place] of [place] Home Phone: [phone] Mobile Phone: [phone] Relation: Niece Secondary Emergency Contact: [name] [place] of [place] Home Phone: [phone] Relation: Friend  Code Status: Full Code  Goals of care: Advanced Directive information Advanced Directives 10/15/2015  Does patient have an advance directive? No  Would patient like information on creating an advanced directive? No - patient declined information     Chief Complaint  Patient presents with  . Medical Management of Chronic Issues    Routine Visit    HPI:  Pt is a 75 y.o. male seen today at [facility] for medical management of chronic diseases.  He has a medical history COPD, Arthritis, Depression, Failure to thrive, S/p right BKA. He is seen in his room today. He denies any acute issues. He is currently on oral antibiotics for right BKA incision infection. Drainage has improved. Facility Nurse reports no new concerns. Afebrile.    Past Medical History  Diagnosis Date  . Gout   . COPD (chronic obstructive pulmonary disease) (HCC)     LONG TIME SMOKER  . 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 Lajoyce Corners   . Critical lower limb ischemia   . Pyelonephritis 06/23/2015  . Arthritis     "hands" (06/23/2015)  . Depression     "periods of depression" (06/23/2015)  . Adult failure to thrive     /notes 06/23/2015  . Physical deconditioning    Past Surgical  History  Procedure Laterality Date  . Knee cartilage surgery Left 1960's    football injury  . Amputation Right 03/01/2014    Procedure: AMPUTATION RAY;  Surgeon: Nadara Mustard, MD;  Location: [facility] OR;  Service: Orthopedics;  Laterality: Right;  Right Foot 5th Ray Amputation  . Amputation Right 04/26/2014    Procedure: Right Foot 4th Ray Amputation;  Surgeon: Nadara Mustard, MD;  Location: [facility] OR;  Service: Orthopedics;  Laterality: Right;  . Lower extremity angiogram N/A 05/27/2014    Procedure: LOWER EXTREMITY ANGIOGRAM;  Surgeon: Runell Gess, MD;  [facility];  Service: Cardiovascular;  Laterality: N/A;  . Inguinal hernia repair Left   . Knee ligament reconstruction Left 1960's  . Cataract extraction w/ intraocular lens  implant, bilateral Bilateral   . Ankle arthroscopy Right 08/13/2015    Procedure: ANKLE ARTHROSCOPY;  Surgeon: Nadara Mustard, MD;  Location: [facility] OR;  Service: Orthopedics;  Laterality: Right;  . Amputation Right 10/01/2015    Procedure: Right Below Knee Amputation;  Surgeon: Nadara Mustard, MD;  Location: [facility] OR;  Service: Orthopedics;  Laterality: Right;    No Known Allergies    Medication List       This list is accurate as of: 11/06/15 11:29 AM.  Always use your most recent med list.               albuterol 108 (90 Base) MCG/ACT inhaler  Commonly known as:  PROVENTIL HFA;VENTOLIN HFA  Inhale 2 puffs into the lungs 2 (two) times daily. Take every daily per niece     cholecalciferol 1000 units tablet  Commonly known as:  VITAMIN D  Take 1,000 Units by mouth daily. Reported on 11/06/2015     Vitamin D3 50000 units Caps  1 by mouth once weekly for 8 weeks. Stop Date 12/05/15     VITAMIN D-3 PO  Take 1,000 Units by mouth daily.     doxycycline 100 MG EC tablet  Commonly known as:  DORYX  Take 100 mg by mouth 2 (two) times daily.     famotidine 20 MG tablet  Commonly known as:  PEPCID  Take 20 mg by mouth daily. For GERD.     linezolid 600 MG  tablet  Commonly known as:  ZYVOX  Take 600 mg by mouth. q12h x 14 days.     loperamide 2 MG tablet  Commonly known as:  IMODIUM A-D  Give  after first intial loose stool then  after each loose stool following. Do not give if < 3 stools or Dx c.diff or norovirus. So do not exceed > 16 mg/24 hr period.     meloxicam 7.5 MG tablet  Commonly known as:  MOBIC  Take 7.5 mg by mouth daily.     methocarbamol 500 MG tablet  Commonly known as:  ROBAXIN  Take 500 mg by mouth every 6 (six) hours as needed for muscle spasms.     mirtazapine 7.5 MG tablet  Commonly known as:  REMERON  Take 7.5 mg by mouth at bedtime.     multivitamin with minerals tablet  Take 1 tablet by mouth daily.     omeprazole 20 MG capsule  Commonly known as:  PRILOSEC  Take 20 mg by mouth daily.     oxyCODONE-acetaminophen 5-325 MG tablet  Commonly known as:  PERCOCET/ROXICET  Take 1 tablet by mouth every 4 (four) hours as needed for moderate pain or severe pain.     senna 8.6 MG Tabs tablet  Commonly known as:  SENOKOT  Take 1 tablet (8.6 mg total) by mouth 2 (two) times daily.     traZODone 50 MG tablet  Commonly known as:  DESYREL  Take 25 mg by mouth at bedtime as needed for sleep.     UNABLE TO FIND  Med Name: Med pass 120 mL three times daily with meals        Review of Systems  Constitutional: Negative for fever, chills, activity change, appetite change and fatigue.       Appetite has improved.   HENT: Negative.   Eyes: Negative.   Respiratory: Negative.   Cardiovascular: Negative for chest pain, palpitations and leg swelling.  Gastrointestinal: Negative for nausea, vomiting, abdominal pain, diarrhea, constipation and abdominal distention.  Endocrine: Negative for cold intolerance, heat intolerance, polydipsia, polyphagia and polyuria.  Genitourinary: Negative.   Musculoskeletal: Positive for gait problem.  Skin: Negative.        Right BKA surgical incision   Allergic/Immunologic:  Negative.   Neurological: Negative.   Psychiatric/Behavioral: Negative.     Immunization History  Administered Date(s) Administered  . Influenza,inj,Quad PF,36+ Mos 06/25/2015  . Influenza-Unspecified 10/03/2015  . PPD Test 10/03/2015  . Pneumococcal Polysaccharide-23 06/25/2015  . Pneumococcal-Unspecified 10/03/2015   Pertinent  Health Maintenance Due  Topic Date Due  . COLONOSCOPY  07/19/2016 (Originally 03/04/1992)  . INFLUENZA VACCINE  02/17/2016  . PNA vac Low Risk Adult (2 of 2 - PCV13) 10/02/2016  No flowsheet data found. Functional Status Survey:    Filed Vitals:   11/06/15 1027  BP: 123/63  Pulse: 56  Temp: 97 F (36.1 C)  Resp: 18  Height: 6\' 4"  (1.93 m)  Weight: 132 lb 1.6 oz (59.92 kg)  SpO2: 94%   Body mass index is 16.09 kg/(m^2). Physical Exam  Constitutional: He is oriented to person, place, and time.  Thin Frail Elderly.  HENT:  Head: Normocephalic.  Mouth/Throat: Oropharynx is clear and moist.  Eyes: Conjunctivae and EOM are normal. Pupils are equal, round, and reactive to light. Right eye exhibits no discharge. Left eye exhibits no discharge. No scleral icterus.  Neck: Normal range of motion. No JVD present. No thyromegaly present.  Cardiovascular: Normal rate, regular rhythm, normal heart sounds and intact distal pulses.  Exam reveals no gallop and no friction rub.   No murmur heard. Pulmonary/Chest: Effort normal and breath sounds normal. No respiratory distress. He has no wheezes. He has no rales.  Abdominal: Soft. Bowel sounds are normal. He exhibits no distension and no mass. There is no tenderness. There is no rebound and no guarding.  Genitourinary:  Voids on urinal   Musculoskeletal: He exhibits no edema or tenderness.  Right BKA   Lymphadenopathy:    He has no cervical adenopathy.  Neurological: He is oriented to person, place, and time.  Skin: Skin is warm and dry. No rash noted. No erythema. No pallor.  R BKA surgical incision  drainage decreased surrounding tissue without any signs of infections.     Labs reviewed:  Recent Labs  08/14/15 0830 08/16/15 0349 10/01/15 10/01/15 1104 10/07/15  NA 140 138 143 143 138  K 4.5 4.0  --  3.6 4.6  CL 107 106  --  108  --   CO2 24 25  --  22  --   GLUCOSE 92 85  --  102*  --   BUN 12 8 22* 22* 13  CREATININE 1.22 1.22 1.3 1.26* 1.1  CALCIUM 8.2* 8.4*  --  8.4*  --     Recent Labs  08/05/15 1505 08/13/15 1232 10/01/15 1104 10/07/15  AST 23 14* 17 10*  ALT 11* 12* 15* 5*  ALKPHOS 67 77 113 85  BILITOT 0.4 0.5 0.7  --   PROT 6.2* 5.7* 5.8*  --   ALBUMIN 3.2* 2.4* 2.5*  --     Recent Labs  08/09/15 2251  08/11/15 0554 08/13/15 1232 08/14/15 0830 10/01/15 1104 10/07/15  WBC 9.0  < > 7.7 12.3* 8.5 17.6* 10.5  NEUTROABS 6.6  --  4.7 9.1*  --   --   --   HGB 9.2*  < > 8.3* 9.9* 8.7* 9.3* 8.1*  HCT 29.3*  < > 26.1* 31.4* 28.1* 29.3* 27*  MCV 74.9*  < > 75.0* 74.9* 76.4* 74.4*  --   PLT 331  < > 287 304 257 199 204  < > = values in this interval not displayed. Lab Results  Component Value Date   TSH 5.64 10/13/2015   TSH 5.64 10/13/2015   No results found for: HGBA1C No results found for: CHOL, HDL, LDLCALC, LDLDIRECT, TRIG, CHOLHDL  Significant Diagnostic Results in last 30 days:  No results found.  Assessment/Plan COPD Afebrile. Exam findings negative for wheezing or SOB. Continue on Albuterol.   Protein Malnutrition  Appetite has improved. BMP 11/07/2015  Right BKA incision  Afebrile. Currently on Doxycycline. Drainage has decreased. Continue current antibiotics. Monitor for signs of infection.  Continue wound care.continue to follow up with Ortho. CBC 11/07/2015   Depression Stable. Continue on Remeron. Monitor for mood changes.   Hypothyroidism  TSH 5.64 ( 10/13/2015) Continue on Levothyroxine. Monitor TSH level.     Family/ staff Communication: Reviewed plan of care with patient and facility Nurse   Labs/tests ordered: CBC, BMP  11/07/2015

## 2015-11-07 LAB — CBC AND DIFFERENTIAL
HCT: 32 % — AB (ref 41–53)
HEMOGLOBIN: 9.2 g/dL — AB (ref 13.5–17.5)
Platelets: 321 10*3/uL (ref 150–399)
WBC: 17.7 10*3/mL

## 2015-11-07 LAB — BASIC METABOLIC PANEL
BUN: 26 mg/dL — AB (ref 4–21)
CREATININE: 1.7 mg/dL — AB (ref 0.6–1.3)
GLUCOSE: 72 mg/dL
POTASSIUM: 5.6 mmol/L — AB (ref 3.4–5.3)
Sodium: 139 mmol/L (ref 137–147)

## 2015-11-11 ENCOUNTER — Other Ambulatory Visit: Payer: Self-pay | Admitting: *Deleted

## 2015-11-11 MED ORDER — OXYCODONE-ACETAMINOPHEN 5-325 MG PO TABS: 1.0000 | ORAL_TABLET | ORAL | Status: DC | PRN

## 2015-11-11 NOTE — Telephone Encounter (Signed)
[facility] 

## 2015-12-02 ENCOUNTER — Non-Acute Institutional Stay: Payer: Medicare HMO | Admitting: Family

## 2015-12-02 DIAGNOSIS — R634 Abnormal weight loss: Secondary | ICD-10-CM

## 2015-12-02 DIAGNOSIS — Z89511 Acquired absence of right leg below knee: Secondary | ICD-10-CM

## 2015-12-02 DIAGNOSIS — E440 Moderate protein-calorie malnutrition: Secondary | ICD-10-CM

## 2015-12-02 DIAGNOSIS — E039 Hypothyroidism, unspecified: Secondary | ICD-10-CM | POA: Diagnosis not present

## 2015-12-02 DIAGNOSIS — J449 Chronic obstructive pulmonary disease, unspecified: Secondary | ICD-10-CM | POA: Diagnosis not present

## 2015-12-02 LAB — BASIC METABOLIC PANEL
BUN: 23 mg/dL — AB (ref 4–21)
Creatinine: 1.3 mg/dL (ref 0.6–1.3)
GLUCOSE: 77 mg/dL
Potassium: 5.5 mmol/L — AB (ref 3.4–5.3)
Sodium: 139 mmol/L (ref 137–147)

## 2015-12-02 NOTE — Progress Notes (Signed)
Patient ID: Caleb Hays, male   DOB: 04-16-1941, 75 y.o.   MRN: [id number]  Location:  [facility]   Place of Service:  SNF (31) Provider:Nyema Hachey FNP-C  Oneal Grout, MD   Egbert Garibaldi, NP  Patient Care Team: Marva Panda, NP as PCP - General Runell Gess, MD as Consulting Physician (Cardiology) Nadara Mustard, MD as Consulting Physician (Orthopedic Surgery)  Extended Emergency Contact Information Primary Emergency Contact: [name],[name] Address: [address]          [place], [place] [address] [place] of [place] Home Phone: [phone] Mobile Phone: [phone] Relation: Niece Secondary Emergency Contact: [name] [place] Home Phone: [phone] Relation: Friend  Code Status: Full Code  Goals of care: Advanced Directive information Advanced Directives 10/15/2015  Does patient have an advance directive? No  Would patient like information on creating an advanced directive? No - patient declined information     Chief Complaint  Patient presents with  . Medical Management of Chronic Issues    HPI:  Pt is a 75 y.o. male seen today at [facility] for medical management of chronic diseases. He has a medical history of COPD, GERD, Constipation, Hypothyroidism, Gout, CKD,PVD status post right BKA  Among other conditions. He is seen in his room today. He continues to spend most time in the bed door and blinds closed. He denies depression symptoms. Facility staff reports patient gets up mostly in the afternoon and in the morning to get coffee. He self propel on facility hallway in the afternoons.He continues to follow up with Ortho for right BKA nonhealing incision site. He denies any fever, chills, cough or shortness of breath.    Past Medical History  Diagnosis Date  . Gout   . COPD (chronic obstructive pulmonary disease) (HCC)     LONG TIME SMOKER  . Status post foot surgery     right fifth  toe amputation by Dr. Lajoyce Corners   . Critical lower limb ischemia   . Pyelonephritis 06/23/2015  . Arthritis     "hands" (06/23/2015)  . Depression     "periods of depression" (06/23/2015)  . Adult failure to thrive     /notes 06/23/2015  . Physical deconditioning    Past Surgical History  Procedure Laterality Date  . Knee cartilage surgery Left 1960's    football injury  . Amputation Right 03/01/2014    Procedure: AMPUTATION RAY;  Surgeon: Nadara Mustard, MD;  Location: [facility] OR;  Service: Orthopedics;  Laterality: Right;  Right Foot 5th Ray Amputation  . Amputation Right 04/26/2014    Procedure: Right Foot 4th Ray Amputation;  Surgeon: Nadara Mustard, MD;  Location: [facility] OR;  Service: Orthopedics;  Laterality: Right;  . Lower extremity angiogram N/A 05/27/2014    Procedure: LOWER EXTREMITY ANGIOGRAM;  Surgeon: Runell Gess, MD;  Location: [facility] CATH LAB;  Service: Cardiovascular;  Laterality: N/A;  . Inguinal hernia repair Left   . Knee ligament reconstruction Left 1960's  . Cataract extraction w/ intraocular lens  implant, bilateral Bilateral   . Ankle arthroscopy Right 08/13/2015    Procedure: ANKLE ARTHROSCOPY;  Surgeon: Nadara Mustard, MD;  Location: [facility] OR;  Service: Orthopedics;  Laterality: Right;  . Amputation Right 10/01/2015    Procedure: Right Below Knee Amputation;  Surgeon: Nadara Mustard, MD;  Location: [facility] OR;  Service: Orthopedics;  Laterality: Right;    No Known Allergies    Medication List  This list is accurate as of: 12/02/15  7:05 PM.  Always use your most recent med list.               albuterol 108 (90 Base) MCG/ACT inhaler  Commonly known as:  PROVENTIL HFA;VENTOLIN HFA  Inhale 2 puffs into the lungs 2 (two) times daily. Take every daily per niece     cholecalciferol 1000 units tablet  Commonly known as:  VITAMIN D  Take 1,000 Units by mouth daily. Reported on 11/06/2015     Vitamin D3 50000 units Caps  1 by mouth once weekly for 8 weeks. Stop Date 12/05/15      VITAMIN D-3 PO  Take 1,000 Units by mouth daily.     famotidine 20 MG tablet  Commonly known as:  PEPCID  Take 20 mg by mouth daily. For GERD.     loperamide 2 MG tablet  Commonly known as:  IMODIUM A-D  Give 4mg  after first intial loose stool then 2mg  after each loose stool following. Do not give if < 3 stools or Dx c.diff or norovirus. So do not exceed > 16 mg/24 hr period.     meloxicam 7.5 MG tablet  Commonly known as:  MOBIC  Take 7.5 mg by mouth daily.     methocarbamol 500 MG tablet  Commonly known as:  ROBAXIN  Take 500 mg by mouth every 6 (six) hours as needed for muscle spasms.     multivitamin with minerals tablet  Take 1 tablet by mouth daily.     omeprazole 20 MG capsule  Commonly known as:  PRILOSEC  Take 20 mg by mouth daily.     oxyCODONE-acetaminophen 5-325 MG tablet  Commonly known as:  PERCOCET/ROXICET  Take 1 tablet by mouth every 4 (four) hours as needed for moderate pain or severe pain. Do not exceed 3gm of APAP in 24 hrs.     senna 8.6 MG Tabs tablet  Commonly known as:  SENOKOT  Take 1 tablet (8.6 mg total) by mouth 2 (two) times daily.     traZODone 50 MG tablet  Commonly known as:  DESYREL  Take 25 mg by mouth at bedtime as needed for sleep.     UNABLE TO FIND  Med Name: Med pass 120 mL three times daily with meals        Review of Systems  Constitutional: Negative for activity change, appetite change, chills, fatigue and fever.       Appetite has improved.   HENT: Negative.   Eyes: Negative.   Respiratory: Negative.   Cardiovascular: Negative for chest pain, palpitations and leg swelling.  Gastrointestinal: Negative for abdominal distention, abdominal pain, constipation, diarrhea, nausea and vomiting.  Endocrine: Negative for cold intolerance, heat intolerance, polydipsia, polyphagia and polyuria.  Genitourinary: Negative for dysuria, flank pain, frequency and urgency.  Musculoskeletal: Positive for gait problem.  Skin: Negative.         Right BKA surgical incision   Allergic/Immunologic: Negative.   Neurological: Negative for dizziness, seizures, light-headedness and headaches.  Psychiatric/Behavioral: Negative for agitation, hallucinations and sleep disturbance. The patient is not nervous/anxious.     Immunization History  Administered Date(s) Administered  . Influenza,inj,Quad PF,36+ Mos 06/25/2015  . Influenza-Unspecified 10/03/2015  . PPD Test 10/03/2015  . Pneumococcal Polysaccharide-23 06/25/2015  . Pneumococcal-Unspecified 10/03/2015   Pertinent  Health Maintenance Due  Topic Date Due  . COLONOSCOPY  07/19/2016 (Originally 03/04/1992)  . INFLUENZA VACCINE  02/17/2016  . PNA vac Low Risk Adult (  2 of 2 - PCV13) 10/02/2016      Filed Vitals:   12/02/15 1855  BP: 135/65  Pulse: 79  Temp: 98.4 F (36.9 C)  Resp: 20  Height: 6\' 4"  (1.93 m)  Weight: 145 lb 6.4 oz (65.953 kg)  SpO2: 93%   Body mass index is 17.71 kg/(m^2). Physical Exam  Constitutional: He is oriented to person, place, and time. He appears well-developed and well-nourished. No distress.  HENT:  Head: Normocephalic.  Mouth/Throat: Oropharynx is clear and moist.  Eyes: Conjunctivae and EOM are normal. Pupils are equal, round, and reactive to light. Right eye exhibits no discharge. Left eye exhibits no discharge. No scleral icterus.  Neck: Normal range of motion. No JVD present. No thyromegaly present.  Cardiovascular: Normal rate, regular rhythm, normal heart sounds and intact distal pulses.  Exam reveals no gallop and no friction rub.   No murmur heard. Pulmonary/Chest: Effort normal and breath sounds normal. No respiratory distress. He has no wheezes. He has no rales.  Abdominal: Soft. Bowel sounds are normal. He exhibits no distension. There is no tenderness. There is no rebound and no guarding.  Musculoskeletal: He exhibits no edema or tenderness.  Right BKA   Lymphadenopathy:    He has no cervical adenopathy.  Neurological: He is  oriented to person, place, and time.  Skin: Skin is warm and dry. No rash noted. No erythema. No pallor.  Left 2 nd toe small scab area noted. Surrounding tissues without any redness. Right BKA wound drsg clean and intact changed by wound Nurse.   Psychiatric: He has a normal mood and affect.    Labs reviewed:  Recent Labs  08/14/15 0830 08/16/15 0349 10/01/15 10/01/15 1104 10/07/15  NA 140 138 143 143 138  K 4.5 4.0  --  3.6 4.6  CL 107 106  --  108  --   CO2 24 25  --  22  --   GLUCOSE 92 85  --  102*  --   BUN 12 8 22* 22* 13  CREATININE 1.22 1.22 1.3 1.26* 1.1  CALCIUM 8.2* 8.4*  --  8.4*  --     Recent Labs  08/05/15 1505 08/13/15 1232 10/01/15 1104 10/07/15  AST 23 14* 17 10*  ALT 11* 12* 15* 5*  ALKPHOS 67 77 113 85  BILITOT 0.4 0.5 0.7  --   PROT 6.2* 5.7* 5.8*  --   ALBUMIN 3.2* 2.4* 2.5*  --     Recent Labs  08/09/15 2251  08/11/15 0554 08/13/15 1232 08/14/15 0830 10/01/15 1104 10/07/15  WBC 9.0  < > 7.7 12.3* 8.5 17.6* 10.5  NEUTROABS 6.6  --  4.7 9.1*  --   --   --   HGB 9.2*  < > 8.3* 9.9* 8.7* 9.3* 8.1*  HCT 29.3*  < > 26.1* 31.4* 28.1* 29.3* 27*  MCV 74.9*  < > 75.0* 74.9* 76.4* 74.4*  --   PLT 331  < > 287 304 257 199 204  < > = values in this interval not displayed. Lab Results  Component Value Date   TSH 5.64 10/13/2015   TSH 5.64 10/13/2015   Assessment/Plan 1. Chronic obstructive pulmonary disease, unspecified COPD type (HCC) Stable.Afebrile. Exam findings negative for wheezing or shortness of breath. Continue on Albuterol inhaler.   2. Status post below knee amputation of right lower extremity (HCC) Coping well. Continue wound care. [facility].   3. Malnutrition of moderate degree Continue  on Protein supplements. Monitor BMP.   4. Weight loss  Has had 11 pounds weight loss over two months. Continue to follow up with Registered Dietician. Monitor weight.   5. Hypothyroidism  Continue on  Levothyroxine. Monitor TSH level.      Family/ staff Communication: Reviewed plan Care with Patient and facility Nurse supervisor.  Labs/tests ordered: CMP 12/03/2015

## 2015-12-03 ENCOUNTER — Other Ambulatory Visit (HOSPITAL_COMMUNITY): Payer: Self-pay | Admitting: Family

## 2015-12-04 ENCOUNTER — Encounter (HOSPITAL_COMMUNITY): Payer: Self-pay | Admitting: *Deleted

## 2015-12-04 MED ORDER — CEFAZOLIN SODIUM-DEXTROSE 2-4 GM/100ML-% IV SOLN
2.0000 g | INTRAVENOUS | Status: AC
  Administered 2015-12-05: 2 g via INTRAVENOUS
  Filled 2015-12-04: qty 100

## 2015-12-04 NOTE — Progress Notes (Signed)
Pt SDW-Pre-op call completed by pt nurse, Thurston Poundsrey, at [facility] where pt resides. Nurse denies pt C/O Sob and chest pain. Pt unavailable, please complete anesthesia assessment DOS. Nurse made aware to stop vitamins, fish oil and herbal medications. Nurse verbalized understanding of all pre-op instructions

## 2015-12-05 ENCOUNTER — Ambulatory Visit (HOSPITAL_COMMUNITY): Payer: Medicare HMO | Admitting: Certified Registered Nurse Anesthetist

## 2015-12-05 ENCOUNTER — Encounter (HOSPITAL_COMMUNITY): Payer: Self-pay | Admitting: Certified Registered Nurse Anesthetist

## 2015-12-05 ENCOUNTER — Encounter (HOSPITAL_COMMUNITY)
Admission: RE | Disposition: A | Discharge status: Skilled Nursing Facility | Payer: Self-pay | Source: Ambulatory Visit | Attending: Orthopedic Surgery

## 2015-12-05 ENCOUNTER — Ambulatory Visit (HOSPITAL_COMMUNITY)
Admission: RE | Admit: 2015-12-05 | Discharge: 2015-12-05 | Disposition: A | Discharge status: Skilled Nursing Facility | Payer: Medicare HMO | Source: Ambulatory Visit | Attending: Orthopedic Surgery | Admitting: Orthopedic Surgery

## 2015-12-05 DIAGNOSIS — M868X6 Other osteomyelitis, lower leg: Secondary | ICD-10-CM | POA: Diagnosis not present

## 2015-12-05 DIAGNOSIS — J449 Chronic obstructive pulmonary disease, unspecified: Secondary | ICD-10-CM | POA: Diagnosis not present

## 2015-12-05 DIAGNOSIS — Y838 Other surgical procedures as the cause of abnormal reaction of the patient, or of later complication, without mention of misadventure at the time of the procedure: Secondary | ICD-10-CM | POA: Diagnosis not present

## 2015-12-05 DIAGNOSIS — Z87891 Personal history of nicotine dependence: Secondary | ICD-10-CM | POA: Diagnosis not present

## 2015-12-05 DIAGNOSIS — T8781 Dehiscence of amputation stump: Secondary | ICD-10-CM | POA: Insufficient documentation

## 2015-12-05 DIAGNOSIS — Z89511 Acquired absence of right leg below knee: Secondary | ICD-10-CM

## 2015-12-05 HISTORY — PX: STUMP REVISION: SHX6102

## 2015-12-05 HISTORY — DX: Anemia, unspecified: D64.9

## 2015-12-05 HISTORY — DX: Dehiscence of amputation stump: T87.81

## 2015-12-05 LAB — BASIC METABOLIC PANEL
ANION GAP: 10 (ref 5–15)
BUN: 22 mg/dL — ABNORMAL HIGH (ref 6–20)
BUN: 26 mg/dL — AB (ref 4–21)
CALCIUM: 9.1 mg/dL (ref 8.9–10.3)
CO2: 26 mmol/L (ref 22–32)
CREATININE: 1.2 mg/dL (ref 0.6–1.3)
Chloride: 104 mmol/L (ref 101–111)
Creatinine, Ser: 1.23 mg/dL (ref 0.61–1.24)
GFR, EST NON AFRICAN AMERICAN: 56 mL/min — AB (ref >60)
Glucose, Bld: 87 mg/dL (ref 65–99)
Glucose: 73 mg/dL
POTASSIUM: 4.8 mmol/L (ref 3.5–5.1)
POTASSIUM: 5 mmol/L (ref 3.4–5.3)
Sodium: 140 mmol/L (ref 135–145)
Sodium: 140 mmol/L (ref 137–147)

## 2015-12-05 LAB — CBC
HEMATOCRIT: 33 % — AB (ref 39.0–52.0)
Hemoglobin: 9.7 g/dL — ABNORMAL LOW (ref 13.0–17.0)
MCH: 22.8 pg — ABNORMAL LOW (ref 26.0–34.0)
MCHC: 29.4 g/dL — ABNORMAL LOW (ref 30.0–36.0)
MCV: 77.6 fL — ABNORMAL LOW (ref 78.0–100.0)
PLATELETS: 347 10*3/uL (ref 150–400)
RBC: 4.25 MIL/uL (ref 4.22–5.81)
RDW: 19.6 % — AB (ref 11.5–15.5)
WBC: 10.5 10*3/uL (ref 4.0–10.5)

## 2015-12-05 LAB — GLUCOSE, CAPILLARY: Glucose-Capillary: 84 mg/dL (ref 65–99)

## 2015-12-05 SURGERY — REVISION, AMPUTATION SITE
Anesthesia: General | Laterality: Right

## 2015-12-05 MED ORDER — PROMETHAZINE HCL 25 MG/ML IJ SOLN
INTRAMUSCULAR | Status: DC
Start: 2015-12-05 — End: 2015-12-05
  Filled 2015-12-05: qty 1

## 2015-12-05 MED ORDER — LIDOCAINE HCL (CARDIAC) 20 MG/ML IV SOLN
INTRAVENOUS | Status: DC | PRN
  Administered 2015-12-05: 60 mg via INTRAVENOUS

## 2015-12-05 MED ORDER — PROPOFOL 10 MG/ML IV BOLUS
INTRAVENOUS | Status: AC
  Filled 2015-12-05: qty 20

## 2015-12-05 MED ORDER — FENTANYL CITRATE (PF) 250 MCG/5ML IJ SOLN
INTRAMUSCULAR | Status: AC
  Filled 2015-12-05: qty 5

## 2015-12-05 MED ORDER — FENTANYL CITRATE (PF) 100 MCG/2ML IJ SOLN
INTRAMUSCULAR | Status: AC
  Administered 2015-12-05: 25 ug via INTRAVENOUS
  Filled 2015-12-05: qty 2

## 2015-12-05 MED ORDER — FENTANYL CITRATE (PF) 100 MCG/2ML IJ SOLN
INTRAMUSCULAR | Status: DC | PRN
  Administered 2015-12-05: 50 ug via INTRAVENOUS

## 2015-12-05 MED ORDER — FENTANYL CITRATE (PF) 100 MCG/2ML IJ SOLN
25.0000 ug | INTRAMUSCULAR | Status: DC | PRN
  Administered 2015-12-05 (×4): 25 ug via INTRAVENOUS

## 2015-12-05 MED ORDER — OXYCODONE-ACETAMINOPHEN 10-325 MG PO TABS: 1.0000 | ORAL_TABLET | ORAL | Status: DC | PRN

## 2015-12-05 MED ORDER — 0.9 % SODIUM CHLORIDE (POUR BTL) OPTIME
TOPICAL | Status: DC | PRN
  Administered 2015-12-05: 1000 mL

## 2015-12-05 MED ORDER — PROPOFOL 10 MG/ML IV BOLUS
INTRAVENOUS | Status: DC | PRN
  Administered 2015-12-05: 120 mg via INTRAVENOUS
  Administered 2015-12-05: 80 mg via INTRAVENOUS

## 2015-12-05 MED ORDER — LACTATED RINGERS IV SOLN
INTRAVENOUS | Status: DC
  Administered 2015-12-05: 12:00:00 via INTRAVENOUS

## 2015-12-05 MED ORDER — ARTIFICIAL TEARS OP OINT
TOPICAL_OINTMENT | OPHTHALMIC | Status: DC | PRN
  Administered 2015-12-05: 1 via OPHTHALMIC

## 2015-12-05 MED ORDER — CHLORHEXIDINE GLUCONATE 4 % EX LIQD: 60.0000 mL | Freq: Once | CUTANEOUS | Status: DC

## 2015-12-05 SURGICAL SUPPLY — 38 items
BLADE SAW RECIP 87.9 MT (BLADE) IMPLANT
BLADE SURG 21 STRL SS (BLADE) ×3 IMPLANT
BNDG COHESIVE 6X5 TAN STRL LF (GAUZE/BANDAGES/DRESSINGS) ×3 IMPLANT
BNDG GAUZE ELAST 4 BULKY (GAUZE/BANDAGES/DRESSINGS) IMPLANT
COVER SURGICAL LIGHT HANDLE (MISCELLANEOUS) ×3 IMPLANT
DRAPE EXTREMITY T 121X128X90 (DRAPE) ×3 IMPLANT
DRAPE INCISE IOBAN 66X45 STRL (DRAPES) ×3 IMPLANT
DRAPE PROXIMA HALF (DRAPES) ×3 IMPLANT
DRAPE U-SHAPE 47X51 STRL (DRAPES) ×6 IMPLANT
DRSG ADAPTIC 3X8 NADH LF (GAUZE/BANDAGES/DRESSINGS) IMPLANT
DURAPREP 26ML APPLICATOR (WOUND CARE) ×3 IMPLANT
ELECT REM PT RETURN 9FT ADLT (ELECTROSURGICAL) ×3
ELECTRODE REM PT RTRN 9FT ADLT (ELECTROSURGICAL) ×1 IMPLANT
GAUZE SPONGE 4X4 12PLY STRL (GAUZE/BANDAGES/DRESSINGS) ×3 IMPLANT
GLOVE BIOGEL PI IND STRL 9 (GLOVE) ×1 IMPLANT
GLOVE BIOGEL PI INDICATOR 9 (GLOVE) ×2
GLOVE SURG ORTHO 9.0 STRL STRW (GLOVE) ×3 IMPLANT
GOWN STRL REUS W/ TWL XL LVL3 (GOWN DISPOSABLE) ×2 IMPLANT
GOWN STRL REUS W/TWL XL LVL3 (GOWN DISPOSABLE) ×4
KIT BASIN OR (CUSTOM PROCEDURE TRAY) ×3 IMPLANT
KIT ROOM TURNOVER OR (KITS) ×3 IMPLANT
MANIFOLD NEPTUNE II (INSTRUMENTS) ×3 IMPLANT
NS IRRIG 1000ML POUR BTL (IV SOLUTION) ×3 IMPLANT
PACK GENERAL/GYN (CUSTOM PROCEDURE TRAY) ×3 IMPLANT
PAD ARMBOARD 7.5X6 YLW CONV (MISCELLANEOUS) ×3 IMPLANT
PAD NEG PRESSURE SENSATRAC (MISCELLANEOUS) IMPLANT
PREVENA INCISION MGT 90 150 (MISCELLANEOUS) ×3 IMPLANT
SPONGE LAP 18X18 X RAY DECT (DISPOSABLE) IMPLANT
STAPLER VISISTAT 35W (STAPLE) ×3 IMPLANT
STOCKINETTE IMPERVIOUS LG (DRAPES) IMPLANT
SUT ETHILON 2 0 PSLX (SUTURE) ×6 IMPLANT
SUT PDS AB 1 CT  36 (SUTURE)
SUT PDS AB 1 CT 36 (SUTURE) IMPLANT
SUT SILK 2 0 (SUTURE)
SUT SILK 2-0 18XBRD TIE 12 (SUTURE) IMPLANT
SUT VIC AB 1 CTX 36 (SUTURE)
SUT VIC AB 1 CTX36XBRD ANBCTR (SUTURE) IMPLANT
TOWEL OR 17X26 10 PK STRL BLUE (TOWEL DISPOSABLE) ×3 IMPLANT

## 2015-12-05 NOTE — Op Note (Signed)
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    The risks benefits and alternatives were discussed with the patient preoperatively including but not limited to the risks of infection, bleeding, nerve injury, cardiopulmonary complications, the need for revision surgery, among others, and the patient was willing to proceed.  OPERATIVE IMPLANTS: Prevena wound VAC  OPERATIVE FINDINGS: Good petechial bleeding with mild ischemic changes to the muscle  OPERATIVE PROCEDURE: Patient brought the operating room and underwent a general anesthetic. After adequate levels anesthesia obtained patient's right lower extremity was prepped using DuraPrep draped into a sterile field a timeout was called. A fishmouth incision was made around the ulcers on the transtibial amputation. The ulcerative tissue plus the distal 3 cm of the tibia and fibula were resected in 1 block of tissue. Electrocautery and 3-0 silk was used for hemostasis. The wound was irrigated with normal saline. The deep and superficial fascial layers and skin was closed using 2-0 nylon. A Prevena wound VAC was applied this had a good suction fit patient was extubated taken to the PACU in stable condition plan for discharge back to skilled nursing. A prescription for Percocet 10 mg tablets he will have the wound VAC left in place for 1 week follow-up in the office that time.

## 2015-12-05 NOTE — H&P (Signed)
Caleb Hays J Hays is an 75 y.o. male.   Chief Complaint: Ulceration with exposed tibia right transtibial amputation HPI: Patient is a 75 year old gentleman with peripheral vascular disease who is status post transtibial amputation. Patient's wound has been healing quite well however he has progressive necrosis over the mid aspect of the incision with exposed bone and osteomyelitis.  Past Medical History  Diagnosis Date  . Gout   . COPD (chronic obstructive pulmonary disease) (HCC)     LONG TIME SMOKER  . 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 Lajoyce Cornersuda   . Critical lower limb ischemia   . Pyelonephritis 06/23/2015  . Arthritis     "hands" (06/23/2015)  . Depression     "periods of depression" (06/23/2015)  . Adult failure to thrive     /notes 06/23/2015  . Physical deconditioning   . Dehiscence of amputation stump (HCC)     with osteomyelitis right BKA  . Anemia     Past Surgical History  Procedure Laterality Date  . Knee cartilage surgery Left 1960's    football injury  . Amputation Right 03/01/2014    Procedure: AMPUTATION RAY;  Surgeon: Nadara MustardMarcus Duda V, MD;  [facility];  Service: Orthopedics;  Laterality: Right;  Right Foot 5th Ray Amputation  . Amputation Right 04/26/2014    Procedure: Right Foot 4th Ray Amputation;  Surgeon: Nadara MustardMarcus Duda V, MD;  [facility];  Service: Orthopedics;  Laterality: Right;  . Lower extremity angiogram N/A 05/27/2014    Procedure: LOWER EXTREMITY ANGIOGRAM;  Surgeon: Runell GessJonathan J Berry, MD;  [facility];  Service: Cardiovascular;  Laterality: N/A;  . Inguinal hernia repair Left   . Knee ligament reconstruction Left 1960's  . Cataract extraction w/ intraocular lens  implant, bilateral Bilateral   . Ankle arthroscopy Right 08/13/2015    Procedure: ANKLE ARTHROSCOPY;  Surgeon: Nadara MustardMarcus Duda V, MD;  [facility];  Service: Orthopedics;  Laterality: Right;  . Amputation Right 10/01/2015    Procedure: Right Below Knee  Amputation;  Surgeon: Nadara MustardMarcus Duda V, MD;  [facility];  Service: Orthopedics;  Laterality: Right;    Family History  Problem Relation Age of Onset  . Heart failure Father   . Heart attack Father   . Heart failure Brother    Social History:  reports that he has quit smoking. His smoking use included Cigarettes. He has a 14.25 pack-year smoking history. He has never used smokeless tobacco. He reports that he drinks alcohol. He reports that he does not use illicit drugs.  Allergies: No Known Allergies  No prescriptions prior to admission    No results found for this or any previous visit (from the past 48 hour(s)). No results found.  Review of Systems  All other systems reviewed and are negative.   There were no vitals taken for this visit. Physical Exam  On examination patient has an ulcer which extends down to the tibia. The wound is approximately 3 cm in diameter extends all the way down to bone there is no abscess no purulence no cellulitis. Assessment/Plan Assessment: Osteomyelitis ulceration dehiscence right transtibial amputation.  Plan: We'll plan for revision of the transtibial amputation. Risks and benefits were discussed including risk of the wound not healing. Patient states he understands and wishes to proceed at this time.  Nadara MustardUDA,MARCUS V, MD 12/05/2015, 6:38 AM

## 2015-12-05 NOTE — Anesthesia Preprocedure Evaluation (Signed)
Anesthesia Evaluation  Patient identified by MRN, date of birth, ID band Patient awake    Reviewed: Allergy & Precautions, NPO status , Patient's Chart, lab work & pertinent test results  History of Anesthesia Complications Negative for: history of anesthetic complications  Airway Mallampati: II  TM Distance: >3 FB Neck ROM: Full    Dental no notable dental hx. (+) Missing, Poor Dentition   Pulmonary neg pulmonary ROS, COPD,  COPD inhaler, Current Smoker, former smoker,    Pulmonary exam normal breath sounds clear to auscultation       Cardiovascular + Peripheral Vascular Disease  Normal cardiovascular exam Rhythm:Regular Rate:Normal     Neuro/Psych PSYCHIATRIC DISORDERS Depression negative neurological ROS     GI/Hepatic Neg liver ROS, GERD  Medicated and Controlled,  Endo/Other  negative endocrine ROS  Renal/GU negative Renal ROS  negative genitourinary   Musculoskeletal negative musculoskeletal ROS (+)   Abdominal   Peds negative pediatric ROS (+)  Hematology negative hematology ROS (+) anemia ,   Anesthesia Other Findings   Reproductive/Obstetrics negative OB ROS                             Anesthesia Physical Anesthesia Plan  ASA: III  Anesthesia Plan: General   Post-op Pain Management:    Induction: Intravenous  Airway Management Planned: LMA  Additional Equipment: None  Intra-op Plan:   Post-operative Plan: Extubation in OR  Informed Consent: I have reviewed the patients History and Physical, chart, labs and discussed the procedure including the risks, benefits and alternatives for the proposed anesthesia with the patient or authorized representative who has indicated his/her understanding and acceptance.   Dental advisory given  Plan Discussed with: CRNA and Surgeon  Anesthesia Plan Comments:         Anesthesia Quick Evaluation

## 2015-12-05 NOTE — Anesthesia Procedure Notes (Signed)
Procedure Name: LMA Insertion Date/Time: 12/05/2015 1:17 PM Performed by: Roney MansSMITH, Manny Vitolo P Pre-anesthesia Checklist: Patient identified, Emergency Drugs available, Suction available, Patient being monitored and Timeout performed Patient Re-evaluated:Patient Re-evaluated prior to inductionOxygen Delivery Method: Circle system utilized Preoxygenation: Pre-oxygenation with 100% oxygen Intubation Type: IV induction LMA: LMA inserted LMA Size: 5.0 Number of attempts: 1 Placement Confirmation: positive ETCO2 and breath sounds checked- equal and bilateral Tube secured with: Tape Dental Injury: Teeth and Oropharynx as per pre-operative assessment

## 2015-12-05 NOTE — Transfer of Care (Signed)
Immediate Anesthesia Transfer of Care Note  Patient: Caleb DuboisJerald J Hays  Procedure(s) Performed: Procedure(s): Revision Right Below Knee Amputation (Right)  Patient Location: PACU  Anesthesia Type:General  Level of Consciousness: awake, alert , oriented and patient cooperative  Airway & Oxygen Therapy: Patient Spontanous Breathing and Patient connected to nasal cannula oxygen  Post-op Assessment: Report given to RN and Post -op Vital signs reviewed and stable  Post vital signs: Reviewed and stable  Last Vitals:  Filed Vitals:   12/05/15 1154  BP: 139/64  Pulse: 57  Temp: 36.7 C  Resp: 18    Last Pain: There were no vitals filed for this visit.    Patients Stated Pain Goal: 3 (12/05/15 1221)  Complications: No apparent anesthesia complications

## 2015-12-05 NOTE — Anesthesia Postprocedure Evaluation (Signed)
Anesthesia Post Note  Patient: Barton DuboisJerald J Cdebaca  Procedure(s) Performed: Procedure(s) (LRB): Revision Right Below Knee Amputation (Right)  Patient location during evaluation: PACU Anesthesia Type: General Level of consciousness: awake and alert Pain management: pain level controlled Vital Signs Assessment: post-procedure vital signs reviewed and stable Respiratory status: spontaneous breathing, nonlabored ventilation, respiratory function stable and patient connected to nasal cannula oxygen Cardiovascular status: blood pressure returned to baseline and stable Postop Assessment: no signs of nausea or vomiting Anesthetic complications: no    Last Vitals:  Filed Vitals:   12/05/15 1500 12/05/15 1615  BP: 116/63 107/75  Pulse:  61  Temp:    Resp:  16    Last Pain:  Filed Vitals:   12/05/15 1627  PainSc: 0-No pain                 Kennieth RadFitzgerald, Tagen Brethauer E

## 2015-12-09 ENCOUNTER — Encounter (HOSPITAL_COMMUNITY): Payer: Self-pay | Admitting: Orthopedic Surgery

## 2015-12-22 ENCOUNTER — Non-Acute Institutional Stay (SKILLED_NURSING_FACILITY): Payer: Medicare HMO | Admitting: Family

## 2015-12-22 ENCOUNTER — Encounter: Payer: Self-pay | Admitting: Family

## 2015-12-22 DIAGNOSIS — L03032 Cellulitis of left toe: Secondary | ICD-10-CM | POA: Diagnosis not present

## 2015-12-22 NOTE — Progress Notes (Signed)
Location:  [facility] Room Number: 1102 Place of Service:  SNF (31) Provider:  Richarda Blade, FNP-C  [place], Joelene Millin, NP  Patient Care Team: Marva Panda, NP as PCP - General Runell Gess, MD as Consulting Physician (Cardiology) Nadara Mustard, MD as Consulting Physician (Orthopedic Surgery)  Extended Emergency Contact Information Primary Emergency Contact: [name],[name] Address: [address]          [place], [place] [address] [place] Home Phone: [phone] Mobile Phone: [phone] Relation: Niece Secondary Emergency Contact: [name] Home Phone: [phone] Relation: Friend  Code Status: Full Code  Goals of care: Advanced Directive information Advanced Directives 12/05/2015  Does patient have an advance directive? No  Would patient like information on creating an advanced directive? No - patient declined information     Chief Complaint  Patient presents with  . Acute Visit    Acute Concerns    HPI:  Pt is a 75 y.o. male seen today at [facility] for an acute visit for evaluation of left second toe drainage.He has a significant medical history of PVD, Right BKA among other conditions. He is seen in his room per facility wound Nurse request. Wound Nurse reports patient's second toe has yellow drainage worrisome for infections. He denies any pain, fever or chills. He also denies any injury to foot.     Past Medical History  Diagnosis Date  . Gout   . COPD (chronic obstructive pulmonary disease) (HCC)     LONG TIME SMOKER  . 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 Lajoyce Corners   . Critical lower limb ischemia   . Pyelonephritis 06/23/2015  . Arthritis     "hands" (06/23/2015)  . Depression     "periods of depression" (06/23/2015)  . Adult failure to thrive     /notes 06/23/2015  . Physical deconditioning   . Dehiscence of amputation stump  (HCC)     with osteomyelitis right BKA  . Anemia    Past Surgical History  Procedure Laterality Date  . Knee cartilage surgery Left 1960's    football injury  . Amputation Right 03/01/2014    Procedure: AMPUTATION RAY;  Surgeon: Nadara Mustard, MD;  Location: [facility] OR;  Service: Orthopedics;  Laterality: Right;  Right Foot 5th Ray Amputation  . Amputation Right 04/26/2014    Procedure: Right Foot 4th Ray Amputation;  Surgeon: Nadara Mustard, MD;  Location: [facility] OR;  Service: Orthopedics;  Laterality: Right;  . Lower extremity angiogram N/A 05/27/2014    Procedure: LOWER EXTREMITY ANGIOGRAM;  Surgeon: Runell Gess, MD;  Location: [facility] CATH LAB;  Service: Cardiovascular;  Laterality: N/A;  . Inguinal hernia repair Left   . Knee ligament reconstruction Left 1960's  . Cataract extraction w/ intraocular lens  implant, bilateral Bilateral   . Ankle arthroscopy Right 08/13/2015    Procedure: ANKLE ARTHROSCOPY;  Surgeon: Nadara Mustard, MD;  Location: [facility] OR;  Service: Orthopedics;  Laterality: Right;  . Amputation Right 10/01/2015    Procedure: Right Below Knee Amputation;  Surgeon: Nadara Mustard, MD;  Location: [facility] OR;  Service: Orthopedics;  Laterality: Right;  . Stump revision Right 12/05/2015    Procedure: Revision Right Below Knee Amputation;  Surgeon: Nadara Mustard, MD;  Location: [facility] OR;  Service: Orthopedics;  Laterality: Right;    No Known Allergies    Medication List  This list is accurate as of: 12/22/15 12:51 PM.  Always use your most recent med list.               albuterol 108 (90 Base) MCG/ACT inhaler  Commonly known as:  PROVENTIL HFA;VENTOLIN HFA  Inhale 2 puffs into the lungs 2 (two) times daily. Take every daily per niece     cholecalciferol 1000 units tablet  Commonly known as:  VITAMIN D  Take 1,000 Units by mouth daily. Reported on 11/06/2015     famotidine 20 MG tablet  Commonly known as:  PEPCID  Take 20 mg by mouth daily. For GERD.     levothyroxine 25 MCG tablet   Commonly known as:  SYNTHROID, LEVOTHROID  Take 25 mcg by mouth daily before breakfast.     loperamide 2 MG tablet  Commonly known as:  IMODIUM A-D  Give 1 - 2 tablets ( 2mg  - 4mg ) after initial dose following  each loose stool following. Do not give if < 3 stools or Dx c.diff or norovirus. So do not exceed > 16 mg/24 hr period.     meloxicam 7.5 MG tablet  Commonly known as:  MOBIC  Take 7.5 mg by mouth daily.     methocarbamol 500 MG tablet  Commonly known as:  ROBAXIN  Take 500 mg by mouth every 6 (six) hours as needed for muscle spasms.     multivitamin with minerals tablet  Take 1 tablet by mouth daily.     omeprazole 20 MG capsule  Commonly known as:  PRILOSEC  Take 20 mg by mouth daily.     oxyCODONE-acetaminophen 10-325 MG tablet  Commonly known as:  PERCOCET  Take 1 tablet by mouth every 4 (four) hours as needed for pain.     senna 8.6 MG Tabs tablet  Commonly known as:  SENOKOT  Take 1 tablet (8.6 mg total) by mouth 2 (two) times daily.     traZODone 50 MG tablet  Commonly known as:  DESYREL  Take 25 mg by mouth at bedtime as needed for sleep.        Review of Systems  Constitutional: Negative for activity change, appetite change, chills, fatigue and fever.          HENT: Negative.   Eyes: Negative.   Respiratory: Negative for cough, chest tightness, shortness of breath and wheezing.   Cardiovascular: Negative for chest pain, palpitations and leg swelling.  Gastrointestinal: Negative for abdominal distention, abdominal pain, constipation, diarrhea, nausea and vomiting.  Genitourinary: Negative for dysuria, flank pain, frequency and urgency.  Musculoskeletal: Positive for gait problem.  Skin: Negative.        Right BKA . Left second toe per HPI   Neurological: Negative for dizziness, seizures, light-headedness and headaches.  Hematological: Does not bruise/bleed easily.  Psychiatric/Behavioral: Negative for agitation, hallucinations and sleep disturbance.  The patient is not nervous/anxious.     Immunization History  Administered Date(s) Administered  . Influenza,inj,Quad PF,36+ Mos 06/25/2015  . Influenza-Unspecified 10/03/2015  . PPD Test 10/03/2015, 12/22/2015  . Pneumococcal Polysaccharide-23 06/25/2015  . Pneumococcal-Unspecified 10/03/2015   Pertinent  Health Maintenance Due  Topic Date Due  . COLONOSCOPY  07/19/2016 (Originally 03/04/1992)  . INFLUENZA VACCINE  02/17/2016  . PNA vac Low Risk Adult (2 of 2 - PCV13) 10/02/2016      Filed Vitals:   12/22/15 1227  BP: 130/70  Pulse: 62  Resp: 18  Height: 6\' 4"  (1.93 m)  Weight: 149 lb 3.2 oz (  67.677 kg)  SpO2: 90%   Body mass index is 18.17 kg/(m^2). Physical Exam  Constitutional: He is oriented to person, place, and time. He appears well-developed and well-nourished. No distress.  HENT:  Head: Normocephalic.  Mouth/Throat: Oropharynx is clear and moist.  Eyes: Conjunctivae and EOM are normal. Pupils are equal, round, and reactive to light. Right eye exhibits no discharge. Left eye exhibits no discharge. No scleral icterus.  Neck: Normal range of motion. No JVD present. No thyromegaly present.  Cardiovascular: Normal rate, regular rhythm, normal heart sounds and intact distal pulses.  Exam reveals no gallop and no friction rub.   No murmur heard. Pulmonary/Chest: Effort normal and breath sounds normal. No respiratory distress. He has no wheezes. He has no rales.  Abdominal: Soft. Bowel sounds are normal. He exhibits no distension. There is no tenderness. There is no rebound and no guarding.  Musculoskeletal: He exhibits no edema or tenderness.  Right BKA   Lymphadenopathy:    He has no cervical adenopathy.  Neurological: He is oriented to person, place, and time.  Skin: Skin is warm and dry. No rash noted. No erythema. No pallor.  Left 2 nd toe dime size area moderate amounts of yellow thick drainage noted. Surrounding skin tissue red and tender to touch.      Psychiatric: He has a normal mood and affect.    Labs reviewed:  Recent Labs  08/16/15 0349  10/01/15 1104  12/02/15 12/05/15 12/05/15 1155  NA 138  < > 143  < > 139 140 140  K 4.0  --  3.6  < > 5.5* 5.0 4.8  CL 106  --  108  --   --   --  104  CO2 25  --  22  --   --   --  26  GLUCOSE 85  --  102*  --   --   --  87  BUN 8  < > 22*  < > 23* 26* 22*  CREATININE 1.22  < > 1.26*  < > 1.3 1.2 1.23  CALCIUM 8.4*  --  8.4*  --   --   --  9.1  < > = values in this interval not displayed.  Recent Labs  08/05/15 1505 08/13/15 1232 10/01/15 1104 10/07/15  AST 23 14* 17 10*  ALT 11* 12* 15* 5*  ALKPHOS 67 77 113 85  BILITOT 0.4 0.5 0.7  --   PROT 6.2* 5.7* 5.8*  --   ALBUMIN 3.2* 2.4* 2.5*  --     Recent Labs  08/09/15 2251  08/11/15 0554 08/13/15 1232 08/14/15 0830 10/01/15 1104 10/07/15 11/07/15 12/05/15 1155  WBC 9.0  < > 7.7 12.3* 8.5 17.6* 10.5 17.7 10.5  NEUTROABS 6.6  --  4.7 9.1*  --   --   --   --   --   HGB 9.2*  < > 8.3* 9.9* 8.7* 9.3* 8.1* 9.2* 9.7*  HCT 29.3*  < > 26.1* 31.4* 28.1* 29.3* 27* 32* 33.0*  MCV 74.9*  < > 75.0* 74.9* 76.4* 74.4*  --   --  77.6*  PLT 331  < > 287 304 257 199 204 321 347  < > = values in this interval not displayed. Lab Results  Component Value Date   TSH 5.64 10/13/2015   TSH 5.64 10/13/2015   Assessment/Plan  Cellulitis of left 2nd toe  Afebrile.Yellow thick drainage with redness and tenderness of surrounding skin tissues. Start clindamycin 300 mg capsule  one by mouth every 6 hours x 7 days. Add Florastor 250 mg capsule twice daily for antibiotics associated diarrhea preventions.    Family/ staff Communication: Reviewed plan of care with patient and facility Nurse supervisor.   Labs/tests ordered:  None

## 2015-12-29 ENCOUNTER — Non-Acute Institutional Stay (SKILLED_NURSING_FACILITY): Payer: Medicare HMO | Admitting: Family

## 2015-12-29 DIAGNOSIS — D638 Anemia in other chronic diseases classified elsewhere: Secondary | ICD-10-CM

## 2015-12-29 DIAGNOSIS — K5900 Constipation, unspecified: Secondary | ICD-10-CM | POA: Insufficient documentation

## 2015-12-29 DIAGNOSIS — K5901 Slow transit constipation: Secondary | ICD-10-CM | POA: Diagnosis not present

## 2015-12-29 DIAGNOSIS — I739 Peripheral vascular disease, unspecified: Secondary | ICD-10-CM

## 2015-12-29 DIAGNOSIS — R627 Adult failure to thrive: Secondary | ICD-10-CM | POA: Diagnosis not present

## 2015-12-29 DIAGNOSIS — J449 Chronic obstructive pulmonary disease, unspecified: Secondary | ICD-10-CM

## 2015-12-29 NOTE — Progress Notes (Signed)
Patient ID: Caleb Hays, male   DOB: 1940/08/26, 75 y.o.   MRN: [id number]  Location:  [facility]   Place of Service:  SNF (31) Provider:  Marena Witts FNP-C   Egbert Garibaldi, NP  Patient Care Team: Marva Panda, NP as PCP - General Runell Gess, MD as Consulting Physician (Cardiology) Nadara Mustard, MD as Consulting Physician (Orthopedic Surgery)  Extended Emergency Contact Information Primary Emergency Contact: [name],[name] Address: [address]          [place], [place] [address] [place] of [place] Home Phone: [phone] Mobile Phone: [phone] Relation: Niece Secondary Emergency Contact: [name] [place] of [place] Home Phone: [phone] Relation: Friend  Code Status: Full code  Goals of care: Advanced Directive information Advanced Directives 12/05/2015  Does patient have an advance directive? No  Would patient like information on creating an advanced directive? No - patient declined information     Chief Complaint  Patient presents with  . Medical Management of Chronic Issues    HPI:  Pt is a 75 y.o. male seen today at [facility]  for medical management of chronic diseases.  He has a medical history of COPD, Failure to  Thrive, PVD, Right BKA, Anemia among others. He is seen in his room today. He denies any acute issues. He follows up with Ortho specialist right stump incision progressive healing to continue with Stump shrinker and now cleared for prosthesis fitting. Previous left second toe cellulitis resolved. Facility reports no new concerns.    Past Medical History  Diagnosis Date  . Gout   . COPD (chronic obstructive pulmonary disease) (HCC)     LONG TIME SMOKER  . 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 Lajoyce Corners   . Critical lower limb ischemia   . Pyelonephritis 06/23/2015  . Arthritis     "hands" (06/23/2015)  . Depression     "periods of depression"  (06/23/2015)  . Adult failure to thrive     /notes 06/23/2015  . Physical deconditioning   . Dehiscence of amputation stump (HCC)     with osteomyelitis right BKA  . Anemia    Past Surgical History  Procedure Laterality Date  . Knee cartilage surgery Left 1960's    football injury  . Amputation Right 03/01/2014    Procedure: AMPUTATION RAY;  Surgeon: Nadara Mustard, MD;  Location: [facility] OR;  Service: Orthopedics;  Laterality: Right;  Right Foot 5th Ray Amputation  . Amputation Right 04/26/2014    Procedure: Right Foot 4th Ray Amputation;  Surgeon: Nadara Mustard, MD;  Location: [facility] OR;  Service: Orthopedics;  Laterality: Right;  . Lower extremity angiogram N/A 05/27/2014    Procedure: LOWER EXTREMITY ANGIOGRAM;  Surgeon: Runell Gess, MD;  Location: [facility] CATH LAB;  Service: Cardiovascular;  Laterality: N/A;  . Inguinal hernia repair Left   . Knee ligament reconstruction Left 1960's  . Cataract extraction w/ intraocular lens  implant, bilateral Bilateral   . Ankle arthroscopy Right 08/13/2015    Procedure: ANKLE ARTHROSCOPY;  Surgeon: Nadara Mustard, MD;  Location: [facility] OR;  Service: Orthopedics;  Laterality: Right;  . Amputation Right 10/01/2015    Procedure: Right Below Knee Amputation;  Surgeon: Nadara Mustard, MD;  Location: [facility] OR;  Service: Orthopedics;  Laterality: Right;  . Stump revision Right 12/05/2015    Procedure: Revision Right Below Knee Amputation;  Surgeon: Nadara Mustard, MD;  Location: [facility] OR;  Service: Orthopedics;  Laterality: Right;    No Known Allergies    Medication List       This list is accurate as of: 12/29/15  3:53 PM.  Always use your most recent med list.               albuterol 108 (90 Base) MCG/ACT inhaler  Commonly known as:  PROVENTIL HFA;VENTOLIN HFA  Inhale 2 puffs into the lungs 2 (two) times daily. Take every daily per niece     cholecalciferol 1000 units tablet  Commonly known as:  VITAMIN D  Take 1,000 Units by mouth daily. Reported on 11/06/2015       famotidine 20 MG tablet  Commonly known as:  PEPCID  Take 20 mg by mouth daily. For GERD.     levothyroxine 25 MCG tablet  Commonly known as:  SYNTHROID, LEVOTHROID  Take 25 mcg by mouth daily before breakfast.     loperamide 2 MG tablet  Commonly known as:  IMODIUM A-D  Give 1 - 2 tablets ( 2mg  - 4mg ) after initial dose following  each loose stool following. Do not give if < 3 stools or Dx c.diff or norovirus. So do not exceed > 16 mg/24 hr period.     meloxicam 7.5 MG tablet  Commonly known as:  MOBIC  Take 7.5 mg by mouth daily.     methocarbamol 500 MG tablet  Commonly known as:  ROBAXIN  Take 500 mg by mouth every 6 (six) hours as needed for muscle spasms.     multivitamin with minerals tablet  Take 1 tablet by mouth daily.     omeprazole 20 MG capsule  Commonly known as:  PRILOSEC  Take 20 mg by mouth daily.     oxyCODONE-acetaminophen 10-325 MG tablet  Commonly known as:  PERCOCET  Take 1 tablet by mouth every 4 (four) hours as needed for pain.     senna 8.6 MG Tabs tablet  Commonly known as:  SENOKOT  Take 1 tablet (8.6 mg total) by mouth 2 (two) times daily.     traZODone 50 MG tablet  Commonly known as:  DESYREL  Take 25 mg by mouth at bedtime as needed for sleep.        Review of Systems  Constitutional: Negative for fever, chills, activity change, appetite change and fatigue.       Appetite has improved.   HENT: Negative.   Eyes: Negative.   Respiratory: Negative.   Cardiovascular: Negative for chest pain, palpitations and leg swelling.  Gastrointestinal: Negative for nausea, vomiting, abdominal pain, diarrhea, constipation and abdominal distention.  Endocrine: Negative for cold intolerance, heat intolerance, polydipsia, polyphagia and polyuria.  Genitourinary: Negative.   Musculoskeletal: Positive for gait problem.  Skin: Negative.        Right BKA surgical incision   Allergic/Immunologic: Negative.   Neurological: Negative.    Psychiatric/Behavioral: Negative.     Immunization History  Administered Date(s) Administered  . Influenza,inj,Quad PF,36+ Mos 06/25/2015  . Influenza-Unspecified 10/03/2015  . PPD Test 10/03/2015, 12/22/2015  . Pneumococcal Polysaccharide-23 06/25/2015  . Pneumococcal-Unspecified 10/03/2015   Pertinent  Health Maintenance Due  Topic Date Due  . COLONOSCOPY  07/19/2016 (Originally 03/04/1992)  . INFLUENZA VACCINE  02/17/2016  . PNA vac Low Risk Adult (2 of 2 - PCV13) 10/02/2016   No flowsheet data found. Functional Status Survey:    Filed Vitals:   12/29/15 1543  BP: 120/66  Pulse: 67  Temp: 97.6  F (36.4 C)  Resp: 18  Height: 6\' 4"  (1.93 m)  Weight: 149 lb 3.2 oz (67.677 kg)  SpO2: 98%   Body mass index is 18.17 kg/(m^2). Physical Exam  Constitutional: He is oriented to person, place, and time.  Thin Frail Elderly.  HENT:  Head: Normocephalic.  Mouth/Throat: Oropharynx is clear and moist.  Eyes: Conjunctivae and EOM are normal. Pupils are equal, round, and reactive to light. Right eye exhibits no discharge. Left eye exhibits no discharge. No scleral icterus.  Neck: Normal range of motion. No JVD present. No thyromegaly present.  Cardiovascular: Normal rate, regular rhythm, normal heart sounds and intact distal pulses.  Exam reveals no gallop and no friction rub.   No murmur heard. Pulmonary/Chest: Effort normal and breath sounds normal. No respiratory distress. He has no wheezes. He has no rales.  Abdominal: Soft. Bowel sounds are normal. He exhibits no distension and no mass. There is no tenderness. There is no rebound and no guarding.  Genitourinary:  Voids on urinal   Musculoskeletal: He exhibits no edema or tenderness.  Right BKA   Lymphadenopathy:    He has no cervical adenopathy.  Neurological: He is oriented to person, place, and time.  Skin: Skin is warm and dry. No rash noted. No erythema. No pallor.  R BKA surgical incision progressive healing drsg  Clean and intact changed.   Psychiatric: He has a normal mood and affect.    Labs reviewed:  Recent Labs  08/16/15 0349  10/01/15 1104  12/02/15 12/05/15 12/05/15 1155  NA 138  < > 143  < > 139 140 140  K 4.0  --  3.6  < > 5.5* 5.0 4.8  CL 106  --  108  --   --   --  104  CO2 25  --  22  --   --   --  26  GLUCOSE 85  --  102*  --   --   --  87  BUN 8  < > 22*  < > 23* 26* 22*  CREATININE 1.22  < > 1.26*  < > 1.3 1.2 1.23  CALCIUM 8.4*  --  8.4*  --   --   --  9.1  < > = values in this interval not displayed.  Recent Labs  08/05/15 1505 08/13/15 1232 10/01/15 1104 10/07/15  AST 23 14* 17 10*  ALT 11* 12* 15* 5*  ALKPHOS 67 77 113 85  BILITOT 0.4 0.5 0.7  --   PROT 6.2* 5.7* 5.8*  --   ALBUMIN 3.2* 2.4* 2.5*  --     Recent Labs  08/09/15 2251  08/11/15 0554 08/13/15 1232 08/14/15 0830 10/01/15 1104 10/07/15 11/07/15 12/05/15 1155  WBC 9.0  < > 7.7 12.3* 8.5 17.6* 10.5 17.7 10.5  NEUTROABS 6.6  --  4.7 9.1*  --   --   --   --   --   HGB 9.2*  < > 8.3* 9.9* 8.7* 9.3* 8.1* 9.2* 9.7*  HCT 29.3*  < > 26.1* 31.4* 28.1* 29.3* 27* 32* 33.0*  MCV 74.9*  < > 75.0* 74.9* 76.4* 74.4*  --   --  77.6*  PLT 331  < > 287 304 257 199 204 321 347  < > = values in this interval not displayed. Lab Results  Component Value Date   TSH 5.64 10/13/2015   TSH 5.64 10/13/2015   No results found for: HGBA1C No results found for: CHOL, HDL, LDLCALC,  LDLDIRECT, TRIG, CHOLHDL  Significant Diagnostic Results in last 30 days:  No results found.  Assessment/Plan 1. Chronic obstructive pulmonary disease, unspecified COPD type (HCC) Stable. Continue on Albuterol 108 mcg inhaler.   2. Failure to thrive in adult No weight loss. Oral intake continues to improve. Continue on MVI.BMP, Hgb A1C, Lipid panel  and TSH level  12/30/2015  3. Anemia of chronic disease CBC 12/30/2015  4. PVD (peripheral vascular disease) (HCC) S/p Right BKA. Continue to monitor and control high risk factors.    5. Slow transit constipation Senna 8.6 mg tablet effective.     Family/ staff Communication:Reviewed plan of care with patient and facility Nurse supervisor.  Labs/tests ordered:  CBC, Hgb A1C, Lipid panel  and TSH level  12/30/2015

## 2015-12-30 LAB — BASIC METABOLIC PANEL
BUN: 30 mg/dL — AB (ref 4–21)
Creatinine: 1.3 mg/dL (ref 0.6–1.3)
GLUCOSE: 81 mg/dL
POTASSIUM: 5.5 mmol/L — AB (ref 3.4–5.3)
Sodium: 139 mmol/L (ref 137–147)

## 2015-12-30 LAB — CBC AND DIFFERENTIAL
HEMATOCRIT: 33 % — AB (ref 41–53)
HEMOGLOBIN: 10 g/dL — AB (ref 13.5–17.5)
Platelets: 342 10*3/uL (ref 150–399)
WBC: 10.1 10^3/mL

## 2015-12-30 LAB — LIPID PANEL
Cholesterol: 113 mg/dL (ref 0–200)
HDL: 18 mg/dL — AB (ref 35–70)
LDL Cholesterol: 63 mg/dL
TRIGLYCERIDES: 159 mg/dL (ref 40–160)

## 2015-12-30 LAB — HEMOGLOBIN A1C: Hemoglobin A1C: 4.4

## 2016-01-01 LAB — BASIC METABOLIC PANEL
BUN: 29 mg/dL — AB (ref 4–21)
Creatinine: 1.3 mg/dL (ref 0.6–1.3)
Glucose: 82 mg/dL
Potassium: 4.9 mmol/L (ref 3.4–5.3)
SODIUM: 141 mmol/L (ref 137–147)

## 2016-01-28 ENCOUNTER — Other Ambulatory Visit: Payer: Self-pay

## 2016-01-28 MED ORDER — OXYCODONE-ACETAMINOPHEN 10-325 MG PO TABS: ORAL_TABLET | ORAL | Status: DC

## 2016-01-28 NOTE — Telephone Encounter (Signed)
Refill request from [facility]  

## 2016-02-05 ENCOUNTER — Encounter: Payer: Self-pay | Admitting: Family

## 2016-02-05 ENCOUNTER — Non-Acute Institutional Stay (SKILLED_NURSING_FACILITY): Payer: Medicare HMO | Admitting: Family

## 2016-02-05 DIAGNOSIS — D638 Anemia in other chronic diseases classified elsewhere: Secondary | ICD-10-CM

## 2016-02-05 DIAGNOSIS — J449 Chronic obstructive pulmonary disease, unspecified: Secondary | ICD-10-CM

## 2016-02-05 DIAGNOSIS — I739 Peripheral vascular disease, unspecified: Secondary | ICD-10-CM | POA: Diagnosis not present

## 2016-02-05 DIAGNOSIS — M1A00X1 Idiopathic chronic gout, unspecified site, with tophus (tophi): Secondary | ICD-10-CM | POA: Diagnosis not present

## 2016-02-05 NOTE — Progress Notes (Signed)
Patient ID: Caleb Hays, male   DOB: 27-Oct-1940, 75 y.o.   MRN: [id number]  Location:    [facility]   Nursing Home Room Number: 1102-A Place of Service:  SNF (31) Provider: Eleina Jergens FNP-C  Egbert Garibaldi, NP  Patient Care Team: Marva Panda, NP as PCP - General Runell Gess, MD as Consulting Physician (Cardiology) Nadara Mustard, MD as Consulting Physician (Orthopedic Surgery)  Extended Emergency Contact Information Primary Emergency Contact: [name],[name] Address: [address]          [place], [place] [address] [place] of [place] Home Phone: [phone] Mobile Phone: [phone] Relation: Niece Secondary Emergency Contact: [name] [place] Home Phone: [phone] Relation: Friend  Code Status: Full Code  Goals of care: Advanced Directive information Advanced Directives 12/05/2015  Does patient have an advance directive? No  Would patient like information on creating an advanced directive? No - patient declined information     Chief Complaint  Patient presents with  . Medical Management of Chronic Issues    Routine Visit    HPI:  Pt is a 75 y.o. male seen today at [facility] for medical management of chronic diseases. He is seen in his room today. Previous left second toe ulcer healed. He request to restart on his colchicine 0.6 mg tablet twice daily states has been taking it for a long period of time worried that his chronic gout will worst. He ate fish previous dinner and seems to have flare up. Colchicine discontinued recently by MD per pharmacy request due to associated multiple potential toxicities in the GI, hematological and neurological side effects. Alternative maintenance therapy recommended.    Past Medical History  Diagnosis Date  . Gout   . COPD (chronic obstructive pulmonary disease) (HCC)     LONG TIME SMOKER  . Status post foot surgery     right fifth toe amputation by  Dr. Lajoyce Corners   . Critical lower limb ischemia   . Pyelonephritis 06/23/2015  . Arthritis     "hands" (06/23/2015)  . Depression     "periods of depression" (06/23/2015)  . Adult failure to thrive     /notes 06/23/2015  . Physical deconditioning   . Dehiscence of amputation stump (HCC)     with osteomyelitis right BKA  . Anemia    Past Surgical History  Procedure Laterality Date  . Knee cartilage surgery Left 1960's    football injury  . Amputation Right 03/01/2014    Procedure: AMPUTATION RAY;  Surgeon: Nadara Mustard, MD;  Location: [facility] OR;  Service: Orthopedics;  Laterality: Right;  Right Foot 5th Ray Amputation  . Amputation Right 04/26/2014    Procedure: Right Foot 4th Ray Amputation;  Surgeon: Nadara Mustard, MD;  Location: [facility] OR;  Service: Orthopedics;  Laterality: Right;  . Lower extremity angiogram N/A 05/27/2014    Procedure: LOWER EXTREMITY ANGIOGRAM;  Surgeon: Runell Gess, MD;  Location: [facility] CATH LAB;  Service: Cardiovascular;  Laterality: N/A;  . Inguinal hernia repair Left   . Knee ligament reconstruction Left 1960's  . Cataract extraction w/ intraocular lens  implant, bilateral Bilateral   . Ankle arthroscopy Right 08/13/2015    Procedure: ANKLE ARTHROSCOPY;  Surgeon: Nadara Mustard, MD;  Location: [facility] OR;  Service: Orthopedics;  Laterality: Right;  . Amputation Right 10/01/2015    Procedure: Right Below Knee Amputation;  Surgeon: Nadara Mustard, MD;  Location: [facility] OR;  Service:  Orthopedics;  Laterality: Right;  . Stump revision Right 12/05/2015    Procedure: Revision Right Below Knee Amputation;  Surgeon: Nadara Mustard, MD;  Location: [facility] OR;  Service: Orthopedics;  Laterality: Right;    No Known Allergies    Medication List       This list is accurate as of: 02/05/16  4:13 PM.  Always use your most recent med list.               albuterol 108 (90 Base) MCG/ACT inhaler  Commonly known as:  PROVENTIL HFA;VENTOLIN HFA  Inhale 2 puffs into the lungs 2 (two) times daily. Take  every daily per niece     cholecalciferol 1000 units tablet  Commonly known as:  VITAMIN D  Take 1,000 Units by mouth daily. Reported on 11/06/2015     famotidine 20 MG tablet  Commonly known as:  PEPCID  Take 20 mg by mouth daily. For GERD.     levothyroxine 25 MCG tablet  Commonly known as:  SYNTHROID, LEVOTHROID  Take 25 mcg by mouth daily before breakfast.     loperamide 2 MG tablet  Commonly known as:  IMODIUM A-D  Give 1 - 2 tablets ( 2mg  - 4mg ) after initial dose following  each loose stool following. Do not give if < 3 stools or Dx c.diff or norovirus. So do not exceed > 16 mg/24 hr period.     meloxicam 7.5 MG tablet  Commonly known as:  MOBIC  Take 7.5 mg by mouth daily.     methocarbamol 500 MG tablet  Commonly known as:  ROBAXIN  Take 500 mg by mouth every 6 (six) hours as needed for muscle spasms.     multivitamin with minerals tablet  Take 1 tablet by mouth daily.     omeprazole 20 MG capsule  Commonly known as:  PRILOSEC  Take 20 mg by mouth daily.     oxyCODONE-acetaminophen 10-325 MG tablet  Commonly known as:  PERCOCET  Take one tablet every 4 hours as needed for moderate pain or severe pain     senna 8.6 MG Tabs tablet  Commonly known as:  SENOKOT  Take 1 tablet (8.6 mg total) by mouth 2 (two) times daily.     traZODone 50 MG tablet  Commonly known as:  DESYREL  Take 25 mg by mouth at bedtime as needed for sleep.        Review of Systems  Constitutional: Negative for fever, chills, activity change, appetite change and fatigue.       Appetite has improved.   HENT: Negative.   Eyes: Negative.   Respiratory: Negative.   Cardiovascular: Negative for chest pain, palpitations and leg swelling.  Gastrointestinal: Negative for nausea, vomiting, abdominal pain, diarrhea, constipation and abdominal distention.  Endocrine: Negative for cold intolerance, heat intolerance, polydipsia, polyphagia and polyuria.  Genitourinary: Negative.   Musculoskeletal:  Positive for gait problem.  Skin: Negative.        Right BKA surgical incision   Allergic/Immunologic: Negative.   Neurological: Negative.   Psychiatric/Behavioral: Negative.     Immunization History  Administered Date(s) Administered  . Influenza,inj,Quad PF,36+ Mos 06/25/2015  . Influenza-Unspecified 10/03/2015  . PPD Test 10/03/2015, 12/22/2015  . Pneumococcal Polysaccharide-23 06/25/2015  . Pneumococcal-Unspecified 10/03/2015   Pertinent  Health Maintenance Due  Topic Date Due  . COLONOSCOPY  07/19/2016 (Originally 03/04/1992)  . INFLUENZA VACCINE  02/17/2016  . PNA vac Low Risk Adult (2 of 2 - PCV13) 10/02/2016  No flowsheet data found. Functional Status Survey:    Filed Vitals:   02/05/16 0910  BP: 142/69  Pulse: 72  Temp: 98.1 F (36.7 C)  TempSrc: Oral  Resp: 22  Height:  (1.93 m)  Weight: 149 lb 12.8 oz (67.949 kg)  SpO2: 98%   Body mass index is 18.24 kg/(m^2). Physical Exam  Constitutional: He is oriented to person, place, and time.  Thin Frail Elderly.  HENT:  Head: Normocephalic.  Mouth/Throat: Oropharynx is clear and moist.  Eyes: Conjunctivae and EOM are normal. Pupils are equal, round, and reactive to light. Right eye exhibits no discharge. Left eye exhibits no discharge. No scleral icterus.  Neck: Normal range of motion. No JVD present. No thyromegaly present.  Cardiovascular: Normal rate, regular rhythm, normal heart sounds and intact distal pulses.  Exam reveals no gallop and no friction rub.   No murmur heard. Pulmonary/Chest: Effort normal and breath sounds normal. No respiratory distress. He has no wheezes. He has no rales.  Abdominal: Soft. Bowel sounds are normal. He exhibits no distension and no mass. There is no tenderness. There is no rebound and no guarding.  Genitourinary:  Voids on urinal   Musculoskeletal: He exhibits no edema or tenderness.  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   Lymphadenopathy:    He has no  cervical adenopathy.  Neurological: He is oriented to person, place, and time.  Skin: Skin is warm and dry. No rash noted. No erythema. No pallor.  R BKA surgical incision healed. Left 2nd toe previous ulcer site scabbed.   Psychiatric: He has a normal mood and affect.    Labs reviewed:  Recent Labs  08/16/15 0349  10/01/15 1104  12/05/15 1155 12/30/15 01/01/16  NA 138  < > 143  < > 140 139 141  K 4.0  --  3.6  < > 4.8 5.5* 4.9  CL 106  --  108  --  104  --   --   CO2 25  --  22  --  26  --   --   GLUCOSE 85  --  102*  --  87  --   --   BUN 8  < > 22*  < > 22* 30* 29*  CREATININE 1.22  < > 1.26*  < > 1.23 1.3 1.3  CALCIUM 8.4*  --  8.4*  --  9.1  --   --   < > = values in this interval not displayed.  Recent Labs  08/05/15 1505 08/13/15 1232 10/01/15 1104 10/07/15  AST 23 14* 17 10*  ALT 11* 12* 15* 5*  ALKPHOS 67 77 113 85  BILITOT 0.4 0.5 0.7  --   PROT 6.2* 5.7* 5.8*  --   ALBUMIN 3.2* 2.4* 2.5*  --     Recent Labs  08/09/15 2251  08/11/15 0554 08/13/15 1232 08/14/15 0830 10/01/15 1104  11/07/15 12/05/15 1155 12/30/15  WBC 9.0  < > 7.7 12.3* 8.5 17.6*  < > 17.7 10.5 10.1  NEUTROABS 6.6  --  4.7 9.1*  --   --   --   --   --   --   HGB 9.2*  < > 8.3* 9.9* 8.7* 9.3*  < > 9.2* 9.7* 10.0*  HCT 29.3*  < > 26.1* 31.4* 28.1* 29.3*  < > 32* 33.0* 33*  MCV 74.9*  < > 75.0* 74.9* 76.4* 74.4*  --   --  77.6*  --   PLT 331  < >  287 304 257 199  < > 321 347 342  < > = values in this interval not displayed. Lab Results  Component Value Date   TSH 5.64 10/13/2015   TSH 5.64 10/13/2015   Lab Results  Component Value Date   HGBA1C 4.4 12/30/2015   Lab Results  Component Value Date   CHOL 113 12/30/2015   HDL 18* 12/30/2015   LDLCALC 63 12/30/2015   TRIG 159 12/30/2015    Significant Diagnostic Results in last 30 days:  No results found.  Assessment/Plan 1. Idiopathic chronic gout with tophus, unspecified site request to restart on his colchicine 0.6 mg  tablet twice daily states has been taking it for a long period of time worried that his chronic gout will worst. He ate fish previous dinner and seems to have flare up. Colchicine discontinued recently by MD per pharmacy request due to associated multiple potential toxicities in the GI, hematological and neurological side effects. Alternative maintenance therapy recommended. Start Allopurinol 100 mg tablet once daily. Obtain uric Acid level, CBC, BMP 02/09/2016  2. PVD (peripheral vascular disease) (HCC) Hx of right BKA. Left second toe ulcer healed. Continue to monitor.   3. Chronic obstructive pulmonary disease, unspecified COPD type (HCC) Stable.Afebrile. No worsening cough or wheezing. Continue on Albuterol inhaler PRN   4. Anemia of chronic disease Monitor CBC     Family/ staff Communication: Reviewed plan of care with patient and facility Nurse  Labs/tests ordered: CBC, BMP, Uric Acid level 02/09/2016

## 2016-02-09 LAB — CBC AND DIFFERENTIAL
HEMATOCRIT: 33 % — AB (ref 41–53)
HEMOGLOBIN: 10 g/dL — AB (ref 13.5–17.5)
PLATELETS: 323 10*3/uL (ref 150–399)
WBC: 6.4 10^3/mL

## 2016-02-09 LAB — BASIC METABOLIC PANEL
BUN: 25 mg/dL — AB (ref 4–21)
Creatinine: 1 mg/dL (ref 0.6–1.3)
Glucose: 84 mg/dL
Potassium: 4.7 mmol/L (ref 3.4–5.3)
Sodium: 141 mmol/L (ref 137–147)

## 2016-02-09 LAB — LIPID PANEL
CHOLESTEROL: 129 mg/dL (ref 0–200)
HDL: 22 mg/dL — AB (ref 35–70)
LDL CALC: 78 mg/dL
TRIGLYCERIDES: 144 mg/dL (ref 40–160)

## 2016-02-09 LAB — HEMOGLOBIN A1C: HEMOGLOBIN A1C: 5.3

## 2016-03-08 ENCOUNTER — Non-Acute Institutional Stay (SKILLED_NURSING_FACILITY): Payer: Medicare HMO | Admitting: Internal Medicine

## 2016-03-08 ENCOUNTER — Encounter: Payer: Self-pay | Admitting: Internal Medicine

## 2016-03-08 DIAGNOSIS — Z89519 Acquired absence of unspecified leg below knee: Secondary | ICD-10-CM | POA: Diagnosis not present

## 2016-03-08 DIAGNOSIS — K219 Gastro-esophageal reflux disease without esophagitis: Secondary | ICD-10-CM | POA: Diagnosis not present

## 2016-03-08 DIAGNOSIS — Z89511 Acquired absence of right leg below knee: Secondary | ICD-10-CM | POA: Insufficient documentation

## 2016-03-08 DIAGNOSIS — N183 Chronic kidney disease, stage 3 unspecified: Secondary | ICD-10-CM | POA: Insufficient documentation

## 2016-03-08 DIAGNOSIS — M109 Gout, unspecified: Secondary | ICD-10-CM

## 2016-03-08 DIAGNOSIS — D638 Anemia in other chronic diseases classified elsewhere: Secondary | ICD-10-CM

## 2016-03-08 DIAGNOSIS — R52 Pain, unspecified: Secondary | ICD-10-CM | POA: Diagnosis not present

## 2016-03-08 DIAGNOSIS — E039 Hypothyroidism, unspecified: Secondary | ICD-10-CM | POA: Diagnosis not present

## 2016-03-08 DIAGNOSIS — G546 Phantom limb syndrome with pain: Secondary | ICD-10-CM | POA: Insufficient documentation

## 2016-03-08 NOTE — Progress Notes (Signed)
LOCATION: [facility]  PCP: Egbert Garibaldi, NP   Code Status: Full Code  Goals of care: Advanced Directive information Advanced Directives 12/05/2015  Does patient have an advance directive? No  Would patient like information on creating an advanced directive? No - patient declined information     Extended Emergency Contact Information Primary Emergency Contact: [name],[name] Address: [address]          [place], [place] [address] [place] Home Phone: [phone] Mobile Phone: [phone] Relation: Niece Secondary Emergency Contact: [name] [place] Home Phone: [phone] Relation: Friend   No Known Allergies  Chief Complaint  Patient presents with  . Medical Management of Chronic Issues    Routine Visit     HPI:  Patient is a 75 y.o. male seen today for routine visit. He has been at his baseline. He is out of bed daily in the evening time. He feeds himself and wheels himself around. He denies any concerns this visit.   Review of Systems:  Constitutional: Negative for fever, chills.  HENT: Negative for headache, congestion, nasal discharge Eyes: Negative for blurred vision, double vision and discharge.  Respiratory: Negative for cough, shortness of breath and wheezing.   Cardiovascular: Negative for chest pain, palpitations, leg swelling.  Gastrointestinal: Negative for nausea, vomiting, abdominal pain. Last bowel movement was yesterday. Genitourinary: Negative for dysuria and flank pain. positive for increased urinary frequency  Musculoskeletal: Negative for back pain, fall in the facility.  Skin: Negative for itching, rash.  Neurological: Negative for dizziness.  Psychiatric/Behavioral: mood has been stable   Past Medical History:  Diagnosis Date  . Adult failure to thrive    /notes 06/23/2015  . Anemia   . Arthritis    "hands" (06/23/2015)  . COPD (chronic obstructive pulmonary disease) (HCC)    LONG TIME  SMOKER  . Critical lower limb ischemia   . 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 Depression    "periods of depression" (06/23/2015)  . Gout   . Physical deconditioning   . Pyelonephritis 06/23/2015  . Status post foot surgery    right fifth toe amputation by Dr. Lajoyce Corners    Past Surgical History:  Procedure Laterality Date  . AMPUTATION Right 03/01/2014   Procedure: AMPUTATION RAY;  Surgeon: Nadara Mustard, MD;  Location: [facility] OR;  Service: Orthopedics;  Laterality: Right;  Right Foot 5th Ray Amputation  . AMPUTATION Right 04/26/2014   Procedure: Right Foot 4th Ray Amputation;  Surgeon: Nadara Mustard, MD;  Location: [facility] OR;  Service: Orthopedics;  Laterality: Right;  . AMPUTATION Right 10/01/2015   Procedure: Right Below Knee Amputation;  Surgeon: Nadara Mustard, MD;  Location: [facility] OR;  Service: Orthopedics;  Laterality: Right;  . ANKLE ARTHROSCOPY Right 08/13/2015   Procedure: ANKLE ARTHROSCOPY;  Surgeon: Nadara Mustard, MD;  Location: [facility] OR;  Service: Orthopedics;  Laterality: Right;  . CATARACT EXTRACTION W/ INTRAOCULAR LENS  IMPLANT, BILATERAL Bilateral   . INGUINAL HERNIA REPAIR Left   . KNEE CARTILAGE SURGERY Left 1960's   football injury  . KNEE LIGAMENT RECONSTRUCTION Left 1960's  . LOWER EXTREMITY ANGIOGRAM N/A 05/27/2014   Procedure: LOWER EXTREMITY ANGIOGRAM;  Surgeon: Runell Gess, MD;  Location: [facility] CATH LAB;  Service: Cardiovascular;  Laterality: N/A;  . STUMP REVISION Right 12/05/2015   Procedure: Revision Right Below Knee Amputation;  Surgeon: Nadara Mustard, MD;  Location: [facility] OR;  Service: Orthopedics;  Laterality: Right;    Medications:   Medication List       Accurate as of 03/08/16  4:40 PM. Always use your most recent med list.          albuterol 108 (90 Base) MCG/ACT inhaler Commonly known as:  PROVENTIL HFA;VENTOLIN HFA Inhale 2 puffs into the lungs 2 (two) times daily. Take every daily per niece   allopurinol 100 MG  tablet Commonly known as:  ZYLOPRIM Take 100 mg by mouth daily.   cholecalciferol 1000 units tablet Commonly known as:  VITAMIN D Take 1,000 Units by mouth daily. Reported on 11/06/2015   famotidine 20 MG tablet Commonly known as:  PEPCID Take 20 mg by mouth daily. For GERD.   levothyroxine 25 MCG tablet Commonly known as:  SYNTHROID, LEVOTHROID Take 12.5 mcg by mouth daily before breakfast.   loperamide 2 MG tablet Commonly known as:  IMODIUM A-D Give 1 - 2 tablets ( 2mg  - 4mg ) after initial dose following  each loose stool following. Do not give if < 3 stools or Dx c.diff or norovirus. So do not exceed > 16 mg/24 hr period.   meloxicam 7.5 MG tablet Commonly known as:  MOBIC Take 7.5 mg by mouth daily.   methocarbamol 500 MG tablet Commonly known as:  ROBAXIN Take 500 mg by mouth every 6 (six) hours as needed for muscle spasms.   multivitamin with minerals tablet Take 1 tablet by mouth daily.   omeprazole 20 MG capsule Commonly known as:  PRILOSEC Take 20 mg by mouth daily.   oxyCODONE-acetaminophen 10-325 MG tablet Commonly known as:  PERCOCET Take one tablet every 4 hours as needed for moderate pain or severe pain   senna 8.6 MG Tabs tablet Commonly known as:  SENOKOT Take 1 tablet (8.6 mg total) by mouth 2 (two) times daily.   traZODone 50 MG tablet Commonly known as:  DESYREL Take 25 mg by mouth at bedtime as needed for sleep.       Immunizations: Immunization History  Administered Date(s) Administered  . Influenza,inj,Quad PF,36+ Mos 06/25/2015  . Influenza-Unspecified 10/03/2015  . PPD Test 10/03/2015, 12/22/2015  . Pneumococcal Polysaccharide-23 06/25/2015  . Pneumococcal-Unspecified 10/03/2015     Physical Exam: Vitals:   03/08/16 1509  BP: 138/60  Pulse: 72  Resp: 18  Temp: 97.3 F (36.3 C)  TempSrc: Oral  SpO2: 97%  Weight: 155 lb 6.4 oz (70.5 kg)  Height: 6\' 4"  (1.93 m)   Body mass index is 18.92 kg/m.  General- elderly frail  and thin built male, in no acute distress Head- normocephalic, atraumatic Nose- no nasal discharge Throat- moist mucus membrane, poor dentition  Eyes- PERRLA, EOMI, no pallor, no icterus, no discharge Neck- no cervical lymphadenopathy Cardiovascular- normal s1,s2, no murmur, trace left leg edema Respiratory- bilateral clear to auscultation, no wheeze, no rhonchi, no crackles, no use of accessory muscles Abdomen- bowel sounds present, soft, non tender Musculoskeletal- able to move all 4 extremities, right BKA Neurological- alert and oriented to person, place and time Skin- warm and dry Psychiatry- normal mood and affect    Labs reviewed: Basic Metabolic Panel:  Recent Labs  08/65/7801/28/17 0349  10/01/15 1104  12/05/15 1155 12/30/15 01/01/16  NA 138  < > 143  < > 140 139 141  K 4.0  --  3.6  < > 4.8 5.5* 4.9  CL 106  --  108  --  104  --   --   CO2 25  --  22  --  26  --   --   GLUCOSE 85  --  102*  --  87  --   --   BUN 8  < > 22*  < > 22* 30* 29*  CREATININE 1.22  < > 1.26*  < > 1.23 1.3 1.3  CALCIUM 8.4*  --  8.4*  --  9.1  --   --   < > = values in this interval not displayed. Liver Function Tests:  Recent Labs  08/05/15 1505 08/13/15 1232 10/01/15 1104 10/07/15  AST 23 14* 17 10*  ALT 11* 12* 15* 5*  ALKPHOS 67 77 113 85  BILITOT 0.4 0.5 0.7  --   PROT 6.2* 5.7* 5.8*  --   ALBUMIN 3.2* 2.4* 2.5*  --    No results for input(s): LIPASE, AMYLASE in the last 8760 hours. No results for input(s): AMMONIA in the last 8760 hours. CBC:  Recent Labs  08/09/15 2251  08/11/15 0554 08/13/15 1232 08/14/15 0830 10/01/15 1104  11/07/15 12/05/15 1155 12/30/15  WBC 9.0  < > 7.7 12.3* 8.5 17.6*  < > 17.7 10.5 10.1  NEUTROABS 6.6  --  4.7 9.1*  --   --   --   --   --   --   HGB 9.2*  < > 8.3* 9.9* 8.7* 9.3*  < > 9.2* 9.7* 10.0*  HCT 29.3*  < > 26.1* 31.4* 28.1* 29.3*  < > 32* 33.0* 33*  MCV 74.9*  < > 75.0* 74.9* 76.4* 74.4*  --   --  77.6*  --   PLT 331  < > 287 304 257 199   < > 321 347 342  < > = values in this interval not displayed. Cardiac Enzymes:  Recent Labs  06/24/15 0337 06/25/15 0316 08/10/15 0820  CKTOTAL 545* 199 21*     Assessment/Plan  Status post right BKA Awaits prosthetic evaluation, to wear his shrinker. Ambulates with help of his wheelchair and is OOB daily  Phantom pain Continue percocet 10-325 mg q4h prn pain and robaxin 500 mg q6h prn muscle spasm  Gout Reviewed uric acid level of 8.7 on 02/09/16. Continue allopurinol 100 mg daily and monitor  gerd Stable, continue pepcid 20 mg daily  Hypothyroidism Lab Results  Component Value Date   TSH 5.64 10/13/2015   TSH 5.64 10/13/2015   On levothyroxine 12.5 mcg daily, check tsh and free t4 and adjust dosing if indicated  CKD 3 Monitor his BMP  Anemia of chronic disease Monitor cbc   Family/ staff Communication: reviewed care plan with patient and nursing supervisor    Oneal GroutMAHIMA Shakevia Sarris, MD Internal Medicine [facility] [address] [place], [place] [address] Cell Phone (Monday-Friday 8 am - 5 pm): [phone] On Call: [phone] and follow prompts after 5 pm and on weekends Office Phone: [phone] Office Fax: [phone]

## 2016-03-15 LAB — TSH: TSH: 3.56 u[IU]/mL (ref <=5.90)

## 2016-04-06 ENCOUNTER — Non-Acute Institutional Stay (SKILLED_NURSING_FACILITY): Payer: Medicare HMO | Admitting: Family

## 2016-04-06 DIAGNOSIS — I739 Peripheral vascular disease, unspecified: Secondary | ICD-10-CM

## 2016-04-06 DIAGNOSIS — M1A09X1 Idiopathic chronic gout, multiple sites, with tophus (tophi): Secondary | ICD-10-CM

## 2016-04-06 DIAGNOSIS — E039 Hypothyroidism, unspecified: Secondary | ICD-10-CM | POA: Diagnosis not present

## 2016-04-06 NOTE — Progress Notes (Signed)
Location:  [facility] Room Number: 1102 Place of Service:  SNF (31) Provider:  Jamyah Folk,Tymeer Vaquera,FNP-C  Millsaps, Joelene MillinKIMBERLY M, NP  Patient Care Team: Marva PandaKimberly Millsaps, NP as PCP - General Runell GessJonathan J Berry, MD as Consulting Physician (Cardiology) Nadara MustardMarcus V Duda, MD as Consulting Physician (Orthopedic Surgery)  Extended Emergency Contact Information Primary Emergency Contact: [name],[name] Address: [address]          [place], [place] [address] [place] Home Phone: [phone] Mobile Phone: [phone] Relation: Niece Secondary Emergency Contact: Roena MaladyParis,Ernie  [place] Home Phone: [phone] Relation: Friend  Code Status:  Full Code Goals of care: Advanced Directive information Advanced Directives 04/06/2016  Does patient have an advance directive? Yes  Type of Advance Directive (No Data)  Does patient want to make changes to advanced directive? No - Patient declined  Copy of advanced directive(s) in chart? Yes  Would patient like information on creating an advanced directive? -     Chief Complaint  Patient presents with  . Medical Management of Chronic Issues    Medical Management of Chronic Issues    HPI:  Pt is a 75 y.o. male seen today at [facility] for medical management of chronic diseases.He has a medical history of HTN, PVD, COPD, Hypothyroidism, Right BKA among other conditions. He is seen in his room today.He denies any acute issues this visit.Left second toe ulcer managed by wound care Nurse. He has had no recent fall episodes, weight changes or recent hospital admission. Facility Nurse reports no new concerns.      Past Medical History:  Diagnosis Date  . Adult failure to thrive    /notes 06/23/2015  . Anemia   . Arthritis    "hands" (06/23/2015)  . COPD (chronic obstructive pulmonary disease) (HCC)    LONG TIME SMOKER  . Critical lower limb ischemia   . 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 Depression    "periods of depression" (06/23/2015)  . Gout   . Physical deconditioning   . Pyelonephritis 06/23/2015  . Status post foot surgery    right fifth toe amputation by Dr. Lajoyce Cornersuda    Past Surgical History:  Procedure Laterality Date  . AMPUTATION Right 03/01/2014   Procedure: AMPUTATION RAY;  Surgeon: Nadara MustardMarcus Duda V, MD;  Location: [facility] OR;  Service: Orthopedics;  Laterality: Right;  Right Foot 5th Ray Amputation  . AMPUTATION Right 04/26/2014   Procedure: Right Foot 4th Ray Amputation;  Surgeon: Nadara MustardMarcus Duda V, MD;  [facility];  Service: Orthopedics;  Laterality: Right;  . AMPUTATION Right 10/01/2015   Procedure: Right Below Knee Amputation;  Surgeon: Nadara MustardMarcus Duda V, MD;  [facility];  Service: Orthopedics;  Laterality: Right;  . ANKLE ARTHROSCOPY Right 08/13/2015   Procedure: ANKLE ARTHROSCOPY;  Surgeon: Nadara MustardMarcus Duda V, MD;  [facility];  Service: Orthopedics;  Laterality: Right;  . CATARACT EXTRACTION W/ INTRAOCULAR LENS  IMPLANT, BILATERAL Bilateral   . INGUINAL HERNIA REPAIR Left   . KNEE CARTILAGE SURGERY Left 1960's   football injury  . KNEE LIGAMENT RECONSTRUCTION Left 1960's  . LOWER EXTREMITY ANGIOGRAM N/A 05/27/2014   Procedure: LOWER EXTREMITY ANGIOGRAM;  Surgeon: Runell GessJonathan J Berry, MD;  [facility];  Service: Cardiovascular;  Laterality: N/A;  . STUMP REVISION Right 12/05/2015   Procedure: Revision Right Below Knee Amputation;  Surgeon: Nadara MustardMarcus Duda V, MD;  [facility];  Service:  Orthopedics;  Laterality: Right;    No Known Allergies    Medication List       Accurate as of 04/06/16  3:47 PM. Always use your most recent med list.          albuterol 108 (90 Base) MCG/ACT inhaler Commonly known as:  PROVENTIL HFA;VENTOLIN HFA Inhale 2 puffs into the lungs 2 (two) times daily. Take every daily per niece   allopurinol 100 MG tablet Commonly known as:  ZYLOPRIM Take 100 mg by mouth  daily.   cholecalciferol 1000 units tablet Commonly known as:  VITAMIN D Take 1,000 Units by mouth daily. Reported on 11/06/2015   famotidine 20 MG tablet Commonly known as:  PEPCID Take 20 mg by mouth daily. For GERD.   levothyroxine 25 MCG tablet Commonly known as:  SYNTHROID, LEVOTHROID Take 12.5 mcg by mouth daily before breakfast.   loperamide 2 MG tablet Commonly known as:  IMODIUM A-D Give 1 - 2 tablets ( 2mg  - 4mg ) after initial dose following  each loose stool following. Do not give if < 3 stools or Dx c.diff or norovirus. So do not exceed > 16 mg/24 hr period.   meloxicam 7.5 MG tablet Commonly known as:  MOBIC Take 7.5 mg by mouth daily.   methocarbamol 500 MG tablet Commonly known as:  ROBAXIN Take 500 mg by mouth every 6 (six) hours as needed for muscle spasms.   multivitamin with minerals tablet Take 1 tablet by mouth daily.   omeprazole 20 MG capsule Commonly known as:  PRILOSEC Take 20 mg by mouth daily.   oxyCODONE-acetaminophen 10-325 MG tablet Commonly known as:  PERCOCET Take one tablet every 4 hours as needed for moderate pain or severe pain   senna 8.6 MG Tabs tablet Commonly known as:  SENOKOT Take 1 tablet (8.6 mg total) by mouth 2 (two) times daily.   traZODone 50 MG tablet Commonly known as:  DESYREL Take 25 mg by mouth at bedtime as needed for sleep.       Review of Systems  Constitutional: Negative for activity change, appetite change, chills, fatigue and fever.       Appetite has improved.   HENT: Negative for congestion, rhinorrhea, sinus pressure, sneezing and sore throat.   Eyes: Negative.   Respiratory: Negative for cough, chest tightness, shortness of breath and wheezing.   Cardiovascular: Negative for chest pain, palpitations and leg swelling.  Gastrointestinal: Negative for abdominal distention, abdominal pain, constipation, diarrhea, nausea and vomiting.  Endocrine: Negative for cold intolerance, heat intolerance, polydipsia,  polyphagia and polyuria.  Genitourinary: Negative for dysuria, frequency and urgency.  Musculoskeletal: Positive for gait problem.  Skin: Negative for color change, pallor and rash.       Right BKA   Neurological: Negative for dizziness, seizures, syncope and headaches.  Hematological: Does not bruise/bleed easily.  Psychiatric/Behavioral: Negative for agitation, confusion, hallucinations and sleep disturbance. The patient is not nervous/anxious.     Immunization History  Administered Date(s) Administered  . Influenza,inj,Quad PF,36+ Mos 06/25/2015  . Influenza-Unspecified 10/03/2015  . PPD Test 10/03/2015, 12/22/2015  . Pneumococcal Polysaccharide-23 06/25/2015  . Pneumococcal-Unspecified 10/03/2015   Pertinent  Health Maintenance Due  Topic Date Due  . COLONOSCOPY  07/19/2016 (Originally [birth year]/1992)  . INFLUENZA VACCINE  07/19/2017 (Originally 02/17/2016)  . PNA vac Low Risk Adult (2 of 2 - PCV13) 10/02/2016    Vitals:   04/06/16 1536  BP: 118/70  Pulse: 70  Resp: 18  Temp: 97.8 F (36.6 C)  TempSrc: Oral  SpO2: 96%  Weight: 164 lb 6.4 oz (74.6 kg)  Height: 6\' 4"  (1.93 m)   Body mass index is 20.01 kg/m. Physical Exam  Constitutional: He is oriented to person, place, and time. He appears well-developed and well-nourished. No distress.  HENT:  Head: Normocephalic.  Mouth/Throat: Oropharynx is clear and moist. No oropharyngeal exudate.  Eyes: Conjunctivae and EOM are normal. Pupils are equal, round, and reactive to light. Right eye exhibits no discharge. Left eye exhibits no discharge. No scleral icterus.  Neck: Normal range of motion. No JVD present. No thyromegaly present.  Cardiovascular: Normal rate, regular rhythm, normal heart sounds and intact distal pulses.  Exam reveals no gallop and no friction rub.   No murmur heard. Pulmonary/Chest: Effort normal and breath sounds normal. No respiratory distress. He has no wheezes. He has no rales.  Abdominal: Soft. Bowel  sounds are normal. He exhibits no distension. There is no tenderness. There is no rebound and no guarding.  Musculoskeletal: He exhibits no edema, tenderness or deformity.  Right BKA   Lymphadenopathy:    He has no cervical adenopathy.  Neurological: He is oriented to person, place, and time.  Skin: Skin is warm and dry. No rash noted. No erythema. No pallor.  Left second toe ulcer without any signs of infections.   Psychiatric: He has a normal mood and affect.    Labs reviewed:  Recent Labs  08/16/15 0349  10/01/15 1104  12/05/15 1155 12/30/15 01/01/16  NA 138  < > 143  < > 140 139 141  K 4.0  --  3.6  < > 4.8 5.5* 4.9  CL 106  --  108  --  104  --   --   CO2 25  --  22  --  26  --   --   GLUCOSE 85  --  102*  --  87  --   --   BUN 8  < > 22*  < > 22* 30* 29*  CREATININE 1.22  < > 1.26*  < > 1.23 1.3 1.3  CALCIUM 8.4*  --  8.4*  --  9.1  --   --   < > = values in this interval not displayed.  Recent Labs  08/05/15 1505 08/13/15 1232 10/01/15 1104 10/07/15  AST 23 14* 17 10*  ALT 11* 12* 15* 5*  ALKPHOS 67 77 113 85  BILITOT 0.4 0.5 0.7  --   PROT 6.2* 5.7* 5.8*  --   ALBUMIN 3.2* 2.4* 2.5*  --     Recent Labs  08/09/15 2251  08/11/15 0554 08/13/15 1232 08/14/15 0830 10/01/15 1104  11/07/15 12/05/15 1155 12/30/15  WBC 9.0  < > 7.7 12.3* 8.5 17.6*  < > 17.7 10.5 10.1  NEUTROABS 6.6  --  4.7 9.1*  --   --   --   --   --   --   HGB 9.2*  < > 8.3* 9.9* 8.7* 9.3*  < > 9.2* 9.7* 10.0*  HCT 29.3*  < > 26.1* 31.4* 28.1* 29.3*  < > 32* 33.0* 33*  MCV 74.9*  < > 75.0* 74.9* 76.4* 74.4*  --   --  77.6*  --   PLT 331  < > 287 304 257 199  < > 321 347 342  < > = values in this interval not displayed. Lab Results  Component Value Date   TSH 5.64 10/13/2015   TSH 5.64 10/13/2015   Lab Results  Component Value Date   HGBA1C 4.4 12/30/2015   Lab Results  Component Value Date   CHOL 113 12/30/2015   HDL 18 (A) 12/30/2015   LDLCALC 63 12/30/2015   TRIG 159  12/30/2015   Assessment/Plan Hypothyroidism  continue on levothyroxine 25 mcg Tablet. Monitor TSH level.   PVD Post right BKA. Has left second toe ulcer managed by wound care Nurse. Continue current dressing changes.continue to follow up with vascular specialist.     Gout  No flare up. Continue on allopurinol.   Family/ staff Communication: Reviewed plan of care with patient and facility Nurse supervisor.   Labs/tests ordered: None

## 2016-04-09 ENCOUNTER — Encounter: Payer: Self-pay | Admitting: Internal Medicine

## 2016-04-09 ENCOUNTER — Non-Acute Institutional Stay (SKILLED_NURSING_FACILITY): Payer: Medicare HMO | Admitting: Internal Medicine

## 2016-04-09 DIAGNOSIS — L03032 Cellulitis of left toe: Secondary | ICD-10-CM

## 2016-04-09 DIAGNOSIS — M79675 Pain in left toe(s): Secondary | ICD-10-CM | POA: Diagnosis not present

## 2016-04-09 NOTE — Progress Notes (Signed)
  LOCATION: [facility]  PCP: Millsaps, [name], NP   Code Status: Full Code  Goals of care: Advanced Directive information Advanced Directives 04/06/2016  Does patient have an advance directive? Yes  Type of Advance Directive (No Data)  Does patient want to make changes to advanced directive? No - Patient declined  Copy of advanced directive(s) in chart? Yes  Would patient like information on creating an advanced directive? -     Extended Emergency Contact Information Primary Emergency Contact: [name],[name] Address: [address]          [place], [place] [address] [place] Home Phone: [phone] Mobile Phone: [phone] Relation: Niece Secondary Emergency Contact: [name],Ernie  [place] Home Phone: [phone] Relation: Friend   No Known Allergies  Chief Complaint  Patient presents with  . Acute Visit    Drainage from toe     HPI:  Patient is a Caleb Hays seen today for acute visit. He has drainage from his second toe per wound nurse. He was picking on his toe and his nail has now fallen off and there is open area in nail bed with drainage. He is seen in his room. He complaints of discomfort to his toe. He denies fever or chills. No fever documented by nursing. No fall reported.    Review of Systems:  Constitutional: Negative for fever, chills.  HENT: Negative for headache Respiratory: Negative for shortness of breath.   Cardiovascular: Negative for chest pain.  Gastrointestinal: Negative for nausea, vomiting, abdominal pain.  Genitourinary: Negative for dysuria Musculoskeletal: Negative for back pain, fall in the facility.  Skin: Negative for itching, rash.    Past Medical History:  Diagnosis Date  . Adult failure to thrive    /notes 06/23/2015  . Anemia   . Arthritis    "hands" (06/23/2015)  . COPD (chronic obstructive pulmonary disease) (HCC)    LONG TIME SMOKER  . Critical lower limb ischemia   . 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 Depression    "periods of depression" (06/23/2015)  . Gout   . Physical deconditioning   . Pyelonephritis 06/23/2015  . Status post foot surgery    right fifth toe amputation by Dr. Duda    Past Surgical History:  Procedure Laterality Date  . AMPUTATION Right 03/01/2014   Procedure: AMPUTATION RAY;  Surgeon: Marcus Duda V, MD;  Location: [facility] OR;  Service: Orthopedics;  Laterality: Right;  Right Foot 5th Ray Amputation  . AMPUTATION Right 04/26/2014   Procedure: Right Foot 4th Ray Amputation;  Surgeon: Marcus Duda V, MD;  Location: [facility] OR;  Service: Orthopedics;  Laterality: Right;  . AMPUTATION Right 10/01/2015   Procedure: Right Below Knee Amputation;  Surgeon: Marcus Duda V, MD;  Location: [facility] OR;  Service: Orthopedics;  Laterality: Right;  . ANKLE ARTHROSCOPY Right 08/13/2015   Procedure: ANKLE ARTHROSCOPY;  Surgeon: Marcus Duda V, MD;  Location: [facility] OR;  Service: Orthopedics;  Laterality: Right;  . CATARACT EXTRACTION W/ INTRAOCULAR LENS  IMPLANT, BILATERAL Bilateral   . INGUINAL HERNIA REPAIR Left   . KNEE CARTILAGE SURGERY Left 1960's   football injury  . KNEE LIGAMENT RECONSTRUCTION Left 1960's  . LOWER EXTREMITY ANGIOGRAM N/A 05/27/2014   Procedure: LOWER EXTREMITY ANGIOGRAM;  Surgeon: Jonathan J Berry, MD;  Location: [facility] CATH LAB;  Service: Cardiovascular;  Laterality: N/A;  . STUMP REVISION Right 12/05/2015   Procedure: Revision Right Below Knee Amputation;    Surgeon: Newt Minion, MD;  Location: [facility];  Service: Orthopedics;  Laterality: Right;    Medications:   Medication List       Accurate as of 04/09/16 12:14 PM. Always use your most recent med list.          albuterol 108 (90 Base) MCG/ACT inhaler Commonly known as:  PROVENTIL HFA;VENTOLIN HFA Inhale 2 puffs into the lungs 2 (two) times daily. Take every daily per niece   allopurinol 100 MG tablet Commonly known as:  ZYLOPRIM Take 100 mg by mouth daily.     cholecalciferol 1000 units tablet Commonly known as:  VITAMIN D Take 1,000 Units by mouth daily. Reported on 11/06/2015   doxycycline 100 MG EC tablet Commonly known as:  DORYX Take 100 mg by mouth 2 (two) times daily. Stop date 04/12/16   famotidine 20 MG tablet Commonly known as:  PEPCID Take 20 mg by mouth daily. For GERD.   levothyroxine 25 MCG tablet Commonly known as:  SYNTHROID, LEVOTHROID Take 12.5 mcg by mouth daily before breakfast.   loperamide 2 MG tablet Commonly known as:  IMODIUM A-D Give 1 - 2 tablets ( 39m - 437m after initial dose following  each loose stool following. Do not give if < 3 stools or Dx c.diff or norovirus. So do not exceed > 16 mg/24 hr period.   meloxicam 7.5 MG tablet Commonly known as:  MOBIC Take 7.5 mg by mouth daily.   methocarbamol 500 MG tablet Commonly known as:  ROBAXIN Take 500 mg by mouth every 6 (six) hours as needed for muscle spasms.   multivitamin with minerals tablet Take 1 tablet by mouth daily.   omeprazole 20 MG capsule Commonly known as:  PRILOSEC Take 20 mg by mouth daily.   oxyCODONE-acetaminophen 10-325 MG tablet Commonly known as:  PERCOCET Take one tablet every 4 hours as needed for moderate pain or severe pain   saccharomyces boulardii 250 MG capsule Commonly known as:  FLORASTOR Take 250 mg by mouth 2 (two) times daily. Stop date 04/15/16   senna 8.6 MG Tabs tablet Commonly known as:  SENOKOT Take 1 tablet (8.6 mg total) by mouth 2 (two) times daily.   traZODone 50 MG tablet Commonly known as:  DESYREL Take 25 mg by mouth at bedtime as needed for sleep.       Immunizations: Immunization History  Administered Date(s) Administered  . Influenza,inj,Quad PF,36+ Mos 06/25/2015  . Influenza-Unspecified 10/03/2015  . PPD Test 10/03/2015, 12/22/2015  . Pneumococcal Polysaccharide-23 06/25/2015  . Pneumococcal-Unspecified 10/03/2015     Physical Exam: Vitals:   04/09/16 1204  BP: (!) 140/59  Pulse:  70  Resp: 20  Temp: 97.2 F (36.2 C)  TempSrc: Oral  Weight: 164 lb 6.4 oz (Caleb.6 kg)  Height: 6' 4" (1.93 m)   Body mass index is 20.01 kg/m.  General- elderly frail and thin built Hays, in no acute distress Head- normocephalic, atraumatic Throat- moist mucus membrane Eyes- PERRLA, EOMI, no pallor, no icterus, no discharge Neck- no cervical lymphadenopathy Cardiovascular- normal s1,s2, no murmur, trace left leg edema Respiratory- bilateral clear to auscultation, no wheeze, no rhonchi, no crackles, no use of accessory muscles Musculoskeletal- able to move all 4 extremities, right BKA, left second toe has purulent drainage on nail bed and this area is tender on probing with q tip, tunneling present, erythema present, capillary refill present, poor dorsalis pedis Neurological- alert and oriented  Skin- warm and dry   Labs reviewed: Basic Metabolic  Panel:  Recent Labs  08/16/15 0349  10/01/15 1104  12/05/15 1155 12/30/15 01/01/16 02/09/16  NA 138  < > 143  < > 140 139 141 141  K 4.0  --  3.6  < > 4.8 5.5* 4.9 4.7  CL 106  --  108  --  104  --   --   --   CO2 25  --  22  --  26  --   --   --   GLUCOSE 85  --  102*  --  87  --   --   --   BUN 8  < > 22*  < > 22* 30* 29* 25*  CREATININE 1.22  < > 1.26*  < > 1.23 1.3 1.3 1.0  CALCIUM 8.4*  --  8.4*  --  9.1  --   --   --   < > = values in this interval not displayed. Liver Function Tests:  Recent Labs  08/05/15 1505 08/13/15 1232 10/01/15 1104 10/07/15  AST 23 14* 17 10*  ALT 11* 12* 15* 5*  ALKPHOS 67 77 113 85  BILITOT 0.4 0.5 0.7  --   PROT 6.2* 5.7* 5.8*  --   ALBUMIN 3.2* 2.4* 2.5*  --    No results for input(s): LIPASE, AMYLASE in the last 8760 hours. No results for input(s): AMMONIA in the last 8760 hours. CBC:  Recent Labs  08/09/15 2251  08/11/15 0554 08/13/15 1232 08/14/15 0830 10/01/15 1104  12/05/15 1155 12/30/15 02/09/16  WBC 9.0  < > 7.7 12.3* 8.5 17.6*  < > 10.5 10.1 6.4  NEUTROABS 6.6  --   4.7 9.1*  --   --   --   --   --   --   HGB 9.2*  < > 8.3* 9.9* 8.7* 9.3*  < > 9.7* 10.0* 10.0*  HCT 29.3*  < > 26.1* 31.4* 28.1* 29.3*  < > 33.0* 33* 33*  MCV Caleb.9*  < > 75.0* Caleb.9* 76.4* Caleb.4*  --  77.6*  --   --   PLT 331  < > 287 304 257 199  < > 347 342 323  < > = values in this interval not displayed. Cardiac Enzymes:  Recent Labs  06/24/15 0337 06/25/15 0316 08/10/15 0820  CKTOTAL 545* 199 21*     Assessment/Plan  Left toe infection Currently on doxycycline started yesterday for empiric coverage for cellulitis. Continue this at 100 mg bid until 04/15/16 for now. Will not send wound culture as pt has been on antibiotic. Xray of foot has been unrevealing. Get MRI of left foot to rule out osteomyelitis for now. Check cbc with doff, cmp, ESR and CRP to assess for infection and osteomyelitis. Monitor vital signs. Monitor for signs of ischemic changes to the toe. Continue to provide wound care.   Left toe pain Has infection to this toe. Wound care. Continue percocet on need basis for pain     [name] PANDEY, MD Internal Medicine [facility] [address] [place], [place] [address] Cell Phone (Monday-Friday 8 am - 5 pm): [phone] On Call: [phone] and follow prompts after 5 pm and on weekends Office Phone: [phone] Office Fax: [phone]    

## 2016-04-12 LAB — BASIC METABOLIC PANEL
BUN: 23 mg/dL — AB (ref 4–21)
CREATININE: 1 mg/dL (ref 0.6–1.3)
GLUCOSE: 117 mg/dL
POTASSIUM: 4.3 mmol/L (ref 3.4–5.3)
SODIUM: 141 mmol/L (ref 137–147)

## 2016-04-12 LAB — HEPATIC FUNCTION PANEL
ALT: 5 U/L — AB (ref 10–40)
AST: 9 U/L — AB (ref 14–40)
Alkaline Phosphatase: 70 U/L (ref 25–125)
Bilirubin, Total: 0.4 mg/dL

## 2016-04-12 LAB — CBC AND DIFFERENTIAL
HEMATOCRIT: 34 % — AB (ref 41–53)
Hemoglobin: 10.3 g/dL — AB (ref 13.5–17.5)
NEUTROS ABS: 3 /uL
Platelets: 321 10*3/uL (ref 150–399)
WBC: 8 10^3/mL

## 2016-04-12 LAB — POCT ERYTHROCYTE SEDIMENTATION RATE, NON-AUTOMATED: SED RATE: 11 mm

## 2016-04-15 ENCOUNTER — Other Ambulatory Visit: Payer: Self-pay | Admitting: Internal Medicine

## 2016-04-15 DIAGNOSIS — G0489 Other myelitis: Secondary | ICD-10-CM

## 2016-04-27 ENCOUNTER — Inpatient Hospital Stay: Admission: RE | Admit: 2016-04-27 | Payer: Medicare HMO | Source: Ambulatory Visit

## 2016-05-05 ENCOUNTER — Non-Acute Institutional Stay (SKILLED_NURSING_FACILITY): Payer: Medicare HMO | Admitting: Family

## 2016-05-05 DIAGNOSIS — D638 Anemia in other chronic diseases classified elsewhere: Secondary | ICD-10-CM | POA: Diagnosis not present

## 2016-05-05 DIAGNOSIS — E039 Hypothyroidism, unspecified: Secondary | ICD-10-CM

## 2016-05-05 DIAGNOSIS — K5901 Slow transit constipation: Secondary | ICD-10-CM

## 2016-05-05 DIAGNOSIS — R609 Edema, unspecified: Secondary | ICD-10-CM | POA: Insufficient documentation

## 2016-05-05 DIAGNOSIS — S91105D Unspecified open wound of left lesser toe(s) without damage to nail, subsequent encounter: Secondary | ICD-10-CM | POA: Diagnosis not present

## 2016-05-05 DIAGNOSIS — R600 Localized edema: Secondary | ICD-10-CM | POA: Diagnosis not present

## 2016-05-05 DIAGNOSIS — J449 Chronic obstructive pulmonary disease, unspecified: Secondary | ICD-10-CM | POA: Diagnosis not present

## 2016-05-05 NOTE — Progress Notes (Signed)
Location:  [facility] Room Number: 1102  Place of Service:  SNF (31) Provider:  Dinah Ngetich FNP-C   Egbert Garibaldi, NP  Patient Care Team: Marva Panda, NP as PCP - General Runell Gess, MD as Consulting Physician (Cardiology) Nadara Mustard, MD as Consulting Physician (Orthopedic Surgery)  Extended Emergency Contact Information Primary Emergency Contact: [name],[name] Address: [address]          [place], [place] [address] [place] Home Phone: [phone] Mobile Phone: [phone] Relation: Niece Secondary Emergency Contact: [name] Home Phone: [phone] Relation: Friend  Code Status:  Full Code  Goals of care: Advanced Directive information Advanced Directives 04/06/2016  Does patient have an advance directive? Yes  Type of Advance Directive (No Data)  Does patient want to make changes to advanced directive? No - Patient declined  Copy of advanced directive(s) in chart? Yes  Would patient like information on creating an advanced directive? -     Chief Complaint  Patient presents with  . Medical Management of Chronic Issues    HPI:  Pt is a 75 y.o. male seen today at [facility]  for medical management of chronic diseases.He has a medical history of COPD, Hypothyroidism, GERD, PVD, Right BKA, CKD, Gout among other conditions. He is seen in his room today. He denies any new acute issues. He had an MRI of left foot to evaluate 2nd toe for osteomyelitis ordered by MD 04/09/2016 but he states MRI was not done due to insurance coverage.Left 2 nd toe open wound stable managed by wound nurse. He denies any fever or chills. He reports no recent fall episodes or hospital admission. His toenails trimmed prior to visit by Podiatrist.    Past Medical History:  Diagnosis Date  . Adult failure to thrive    /notes 06/23/2015  . Anemia   . Arthritis    "hands" (06/23/2015)   . COPD (chronic obstructive pulmonary disease) (HCC)    LONG TIME SMOKER  . Critical lower limb ischemia   . 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 Depression    "periods of depression" (06/23/2015)  . Gout   . Physical deconditioning   . Pyelonephritis 06/23/2015  . Status post foot surgery    right fifth toe amputation by Dr. Lajoyce Corners    Past Surgical History:  Procedure Laterality Date  . AMPUTATION Right 03/01/2014   Procedure: AMPUTATION RAY;  Surgeon: Nadara Mustard, MD;  Location: [facility] OR;  Service: Orthopedics;  Laterality: Right;  Right Foot 5th Ray Amputation  . AMPUTATION Right 04/26/2014   Procedure: Right Foot 4th Ray Amputation;  Surgeon: Nadara Mustard, MD;  Location: [facility] OR;  Service: Orthopedics;  Laterality: Right;  . AMPUTATION Right 10/01/2015   Procedure: Right Below Knee Amputation;  Surgeon: Nadara Mustard, MD;  Location: [facility] OR;  Service: Orthopedics;  Laterality: Right;  . ANKLE ARTHROSCOPY Right 08/13/2015   Procedure: ANKLE ARTHROSCOPY;  Surgeon: Nadara Mustard, MD;  Location: [facility] OR;  Service: Orthopedics;  Laterality: Right;  . CATARACT EXTRACTION W/ INTRAOCULAR LENS  IMPLANT, BILATERAL Bilateral   . INGUINAL HERNIA REPAIR Left   . KNEE CARTILAGE SURGERY Left 1960's   football injury  . KNEE LIGAMENT RECONSTRUCTION Left 1960's  . LOWER EXTREMITY ANGIOGRAM N/A 05/27/2014   Procedure: LOWER EXTREMITY ANGIOGRAM;  Surgeon: Runell Gess, MD;  Location[facility] CATH LAB;  Service: Cardiovascular;  Laterality: N/A;  . STUMP REVISION Right 12/05/2015   Procedure: Revision Right Below Knee Amputation;  Surgeon: Nadara Mustard, MD;  Location: [facility] OR;  Service: Orthopedics;  Laterality: Right;    No Known Allergies    Medication List       Accurate as of 05/05/16  4:52 PM. Always use your most recent med list.          albuterol 108 (90 Base) MCG/ACT inhaler Commonly known as:  PROVENTIL HFA;VENTOLIN HFA Inhale 2 puffs into the lungs 2 (two)  times daily. Take every daily per niece   allopurinol 100 MG tablet Commonly known as:  ZYLOPRIM Take 100 mg by mouth daily.   cholecalciferol 1000 units tablet Commonly known as:  VITAMIN D Take 1,000 Units by mouth daily. Reported on 11/06/2015   famotidine 20 MG tablet Commonly known as:  PEPCID Take 20 mg by mouth daily. For GERD.   levothyroxine 25 MCG tablet Commonly known as:  SYNTHROID, LEVOTHROID Take 12.5 mcg by mouth daily before breakfast.   loperamide 2 MG tablet Commonly known as:  IMODIUM A-D Give 1 - 2 tablets ( 2mg  - 4mg ) after initial dose following  each loose stool following. Do not give if < 3 stools or Dx c.diff or norovirus. So do not exceed > 16 mg/24 hr period.   meloxicam 7.5 MG tablet Commonly known as:  MOBIC Take 7.5 mg by mouth daily.   methocarbamol 500 MG tablet Commonly known as:  ROBAXIN Take 500 mg by mouth every 6 (six) hours as needed for muscle spasms.   multivitamin with minerals tablet Take 1 tablet by mouth daily.   omeprazole 20 MG capsule Commonly known as:  PRILOSEC Take 20 mg by mouth daily.   oxyCODONE-acetaminophen 10-325 MG tablet Commonly known as:  PERCOCET Take one tablet every 4 hours as needed for moderate pain or severe pain   saccharomyces boulardii 250 MG capsule Commonly known as:  FLORASTOR Take 250 mg by mouth 2 (two) times daily. Stop date 04/15/16   senna 8.6 MG Tabs tablet Commonly known as:  SENOKOT Take 1 tablet (8.6 mg total) by mouth 2 (two) times daily.   traZODone 50 MG tablet Commonly known as:  DESYREL Take 25 mg by mouth at bedtime as needed for sleep.       Review of Systems  Constitutional: Negative for activity change, appetite change, chills, fatigue and fever.  HENT: Negative for congestion, postnasal drip, rhinorrhea, sinus pressure, sneezing and sore throat.   Eyes: Negative.   Respiratory: Negative for cough, chest tightness, shortness of breath and wheezing.   Cardiovascular:  Negative for chest pain, palpitations and leg swelling.  Gastrointestinal: Negative for abdominal distention, abdominal pain, constipation, diarrhea, nausea and vomiting.  Endocrine: Negative for cold intolerance, heat intolerance, polydipsia, polyphagia and polyuria.  Genitourinary: Negative for dysuria, frequency and urgency.  Musculoskeletal: Positive for gait problem.  Skin: Negative.        Right BKA  Allergic/Immunologic: Negative.   Neurological: Negative for dizziness, seizures, syncope, light-headedness and headaches.  Psychiatric/Behavioral: Negative.     Immunization History  Administered Date(s) Administered  . Influenza,inj,Quad PF,36+ Mos 06/25/2015  . Influenza-Unspecified 10/03/2015  . PPD Test 10/03/2015, 12/22/2015  . Pneumococcal Polysaccharide-23 06/25/2015  . Pneumococcal-Unspecified 10/03/2015   Pertinent  Health Maintenance Due  Topic Date Due  . COLONOSCOPY  07/19/2016 (Originally 03/04/1992)  . INFLUENZA VACCINE  07/19/2017 (Originally 02/17/2016)  . PNA vac Low Risk Adult (2 of  2 - PCV13) 10/02/2016      Vitals:   05/05/16 1600  BP: 134/70  Pulse: 75  Resp: 20  Temp: 98.6 F (37 C)  SpO2: 98%  Weight: 175 lb 12.8 oz (79.7 kg)  Height: 6\' 4"  (1.93 m)   Body mass index is 21.4 kg/m. Physical Exam  Constitutional: He is oriented to person, place, and time.  Thin frail Elderly in no acute distress.   HENT:  Head: Normocephalic.  Mouth/Throat: Oropharynx is clear and moist.  Eyes: Conjunctivae and EOM are normal. Pupils are equal, round, and reactive to light. Right eye exhibits no discharge. Left eye exhibits no discharge. No scleral icterus.  Neck: Normal range of motion. No JVD present. No thyromegaly present.  Cardiovascular: Normal rate, regular rhythm, normal heart sounds and intact distal pulses.  Exam reveals no gallop and no friction rub.   No murmur heard. Pulmonary/Chest: Effort normal and breath sounds normal. No respiratory distress.  He has no wheezes. He has no rales.  Abdominal: Soft. Bowel sounds are normal. He exhibits no distension and no mass. There is no tenderness. There is no rebound and no guarding.  Genitourinary:  Genitourinary Comments: Voids on urinal   Musculoskeletal: He exhibits no edema or tenderness.  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   Lymphadenopathy:    He has no cervical adenopathy.  Neurological: He is oriented to person, place, and time.  Skin: Skin is warm and dry. No rash noted. No erythema. No pallor.  R BKA. Left 2nd toe open wound managed by wound Nurse no drainage surrounding skin without any signs of infections.   Psychiatric: He has a normal mood and affect.    Labs reviewed:  Recent Labs  08/16/15 0349  10/01/15 1104  12/05/15 1155 12/30/15 01/01/16 02/09/16  NA 138  < > 143  < > 140 139 141 141  K 4.0  --  3.6  < > 4.8 5.5* 4.9 4.7  CL 106  --  108  --  104  --   --   --   CO2 25  --  22  --  26  --   --   --   GLUCOSE 85  --  102*  --  87  --   --   --   BUN 8  < > 22*  < > 22* 30* 29* 25*  CREATININE 1.22  < > 1.26*  < > 1.23 1.3 1.3 1.0  CALCIUM 8.4*  --  8.4*  --  9.1  --   --   --   < > = values in this interval not displayed.  Recent Labs  08/05/15 1505 08/13/15 1232 10/01/15 1104 10/07/15  AST 23 14* 17 10*  ALT 11* 12* 15* 5*  ALKPHOS 67 77 113 85  BILITOT 0.4 0.5 0.7  --   PROT 6.2* 5.7* 5.8*  --   ALBUMIN 3.2* 2.4* 2.5*  --     Recent Labs  08/09/15 2251  08/11/15 0554 08/13/15 1232 08/14/15 0830 10/01/15 1104  12/05/15 1155 12/30/15 02/09/16  WBC 9.0  < > 7.7 12.3* 8.5 17.6*  < > 10.5 10.1 6.4  NEUTROABS 6.6  --  4.7 9.1*  --   --   --   --   --   --   HGB 9.2*  < > 8.3* 9.9* 8.7* 9.3*  < > 9.7* 10.0* 10.0*  HCT 29.3*  < > 26.1* 31.4* 28.1* 29.3*  < >  33.0* 33* 33*  MCV 74.9*  < > 75.0* 74.9* 76.4* 74.4*  --  77.6*  --   --   PLT 331  < > 287 304 257 199  < > 347 342 323  < > = values in this interval not displayed. Lab Results    Component Value Date   TSH 3.56 03/15/2016   Lab Results  Component Value Date   HGBA1C 5.3 02/09/2016   Lab Results  Component Value Date   CHOL 129 02/09/2016   HDL 22 (A) 02/09/2016   LDLCALC 78 02/09/2016   TRIG 144 02/09/2016   Assessment/Plan 1. Chronic obstructive pulmonary disease, unspecified COPD type (HCC) Stable. Exam findings negative for SOB or wheezes. Cont Albuterol  2. Localized edema 1-2+ edema to LLE. Apply knee high ted hose on in the morning and off at bedtime.   3. Hypothyroidism (acquired) Continue on levothyroxine. Continue to monitor TSH level   4. Slow transit constipation Current regimen effective. Continue to encourage hydration.   5. Anemia of chronic disease Recent Hgb 10.3 previous 10.0 continue to monitor CBC.   6. Open wound of second toe of left foot, subsequent encounter Completed course of antibiotics. No signs of infections noted. MRI of left foot to evaluate 2nd toe for osteomyelitis ordered by MD 04/09/2016 but he states MRI was not done due to insurance coverage.Continue wound care. Monitor CBC.     Family/ staff Communication: Reviewed plan of care with patient and facility Nurse supervisor.   Labs/tests ordered:  None

## 2016-06-14 ENCOUNTER — Other Ambulatory Visit: Payer: Self-pay

## 2016-06-14 ENCOUNTER — Non-Acute Institutional Stay (SKILLED_NURSING_FACILITY): Payer: Medicare HMO | Admitting: Family

## 2016-06-14 DIAGNOSIS — J449 Chronic obstructive pulmonary disease, unspecified: Secondary | ICD-10-CM

## 2016-06-14 DIAGNOSIS — K219 Gastro-esophageal reflux disease without esophagitis: Secondary | ICD-10-CM | POA: Diagnosis not present

## 2016-06-14 DIAGNOSIS — L97521 Non-pressure chronic ulcer of other part of left foot limited to breakdown of skin: Secondary | ICD-10-CM

## 2016-06-14 DIAGNOSIS — K5901 Slow transit constipation: Secondary | ICD-10-CM | POA: Diagnosis not present

## 2016-06-14 DIAGNOSIS — E039 Hypothyroidism, unspecified: Secondary | ICD-10-CM | POA: Diagnosis not present

## 2016-06-14 MED ORDER — OXYCODONE-ACETAMINOPHEN 10-325 MG PO TABS: ORAL_TABLET | ORAL | 0 refills | Status: DC

## 2016-06-14 NOTE — Telephone Encounter (Signed)
Rx faxed to [facility] @ [phone], phone number [phone]  

## 2016-06-14 NOTE — Progress Notes (Signed)
Location:  [facility] Room Number: 1102 A  Place of Service:  SNF (31) Provider:  Dinah Ngetich FNP-C   [name], [name], MD  Patient Care Team: Oneal GroutMahima Pandey, MD as PCP - General (Internal Medicine) Runell GessJonathan J Berry, MD as Consulting Physician (Cardiology) Nadara MustardMarcus V Duda, MD as Consulting Physician (Orthopedic Surgery)  Extended Emergency Contact Information Primary Emergency Contact: [name],[name] Address: [address]          [place], [place] [address] [place] Home Phone: [phone] Mobile Phone: [phone] Relation: Niece Secondary Emergency Contact: Roena MaladyParis,Ernie  [place] Home Phone: [phone] Relation: Friend  Code Status: Full Code  Goals of care: Advanced Directive information Advanced Directives 04/06/2016  Does Patient Have a Medical Advance Directive? Yes  Type of Advance Directive (No Data)  Does patient want to make changes to medical advance directive? No - Patient declined  Copy of Healthcare Power of Attorney in Chart? Yes  Would patient like information on creating a medical advance directive? -     Chief Complaint  Patient presents with  . Medical Management of Chronic Issues    HPI:  Pt is a 75 y.o. male [facility]  for medical management of chronic diseases. He has a medical history of HTN, PVD, COPD, Hypothyroidism, Right BKA  Among others. He is seen in his room today. He denies any acute issuses. He is currently on oral antibiotics Cipro twice daily initiated 06/12/2016 for UTI. He states burning sensation has already improved. Facility Nurse reports no new concerns.    Past Medical History:  Diagnosis Date  . Adult failure to thrive    /notes 06/23/2015  . Anemia   . Arthritis    "hands" (06/23/2015)  . COPD (chronic obstructive pulmonary disease) (HCC)    LONG TIME SMOKER  . Critical lower limb ischemia   . 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 Depression    "periods of depression" (06/23/2015)  . Gout   . Physical deconditioning   . Pyelonephritis 06/23/2015  . Status post foot surgery    right fifth toe amputation by Dr. Lajoyce Cornersuda    Past Surgical History:  Procedure Laterality Date  . AMPUTATION Right 03/01/2014   Procedure: AMPUTATION RAY;  Surgeon: Nadara MustardMarcus Duda V, MD;  Location: [facility] OR;  Service: Orthopedics;  Laterality: Right;  Right Foot 5th Ray Amputation  . AMPUTATION Right 04/26/2014   Procedure: Right Foot 4th Ray Amputation;  Surgeon: Nadara MustardMarcus Duda V, MD;  [facility];  Service: Orthopedics;  Laterality: Right;  . AMPUTATION Right 10/01/2015   Procedure: Right Below Knee Amputation;  Surgeon: Nadara MustardMarcus Duda V, MD;  [facility];  Service: Orthopedics;  Laterality: Right;  . ANKLE ARTHROSCOPY Right 08/13/2015   Procedure: ANKLE ARTHROSCOPY;  Surgeon: Nadara MustardMarcus Duda V, MD;  [facility];  Service: Orthopedics;  Laterality: Right;  . CATARACT EXTRACTION W/ INTRAOCULAR LENS  IMPLANT, BILATERAL Bilateral   . INGUINAL HERNIA REPAIR Left   . KNEE CARTILAGE SURGERY Left 1960's   football injury  . KNEE LIGAMENT RECONSTRUCTION Left 1960's  . LOWER EXTREMITY ANGIOGRAM N/A 05/27/2014   Procedure: LOWER EXTREMITY ANGIOGRAM;  Surgeon: Runell GessJonathan J Berry, MD;  [facility];  Service: Cardiovascular;  Laterality: N/A;  . STUMP REVISION Right 12/05/2015   Procedure: Revision Right Below Knee Amputation;  Surgeon: Nadara MustardMarcus Duda V, MD;  Location: [facility] OR;  Service: Orthopedics;  Laterality: Right;    No Known Allergies    Medication List       Accurate as of 06/14/16  7:04 PM. Always use your most recent med list.          albuterol 108 (90 Base) MCG/ACT inhaler Commonly known as:  PROVENTIL HFA;VENTOLIN HFA Inhale 2 puffs into the lungs 2 (two) times daily. Take every daily per niece   allopurinol 100 MG tablet Commonly known as:  ZYLOPRIM Take 100 mg by mouth daily.   cholecalciferol  1000 units tablet Commonly known as:  VITAMIN D Take 1,000 Units by mouth daily. Reported on 11/06/2015   ciprofloxacin 500 MG tablet Commonly known as:  CIPRO Take 500 mg by mouth 2 (two) times daily.   famotidine 20 MG tablet Commonly known as:  PEPCID Take 20 mg by mouth daily. For GERD.   levothyroxine 25 MCG tablet Commonly known as:  SYNTHROID, LEVOTHROID Take 12.5 mcg by mouth daily before breakfast.   loperamide 2 MG tablet Commonly known as:  IMODIUM A-D Give 1 - 2 tablets ( 2mg  - 4mg ) after initial dose following  each loose stool following. Do not give if < 3 stools or Dx c.diff or norovirus. So do not exceed > 16 mg/24 hr period.   meloxicam 7.5 MG tablet Commonly known as:  MOBIC Take 7.5 mg by mouth daily.   methocarbamol 500 MG tablet Commonly known as:  ROBAXIN Take 500 mg by mouth every 6 (six) hours as needed for muscle spasms.   multivitamin with minerals tablet Take 1 tablet by mouth daily.   omeprazole 20 MG capsule Commonly known as:  PRILOSEC Take 20 mg by mouth daily.   oxyCODONE-acetaminophen 10-325 MG tablet Commonly known as:  PERCOCET Take one tablet every 4 hours as needed for moderate pain or severe pain DO NOT EXCEED 4GM OF APAP IN 24 HOURS FROM ALL SOURCES   saccharomyces boulardii 250 MG capsule Commonly known as:  FLORASTOR Take 250 mg by mouth 2 (two) times daily. Stop date 04/15/16   senna 8.6 MG Tabs tablet Commonly known as:  SENOKOT Take 1 tablet (8.6 mg total) by mouth 2 (two) times daily.   traZODone 50 MG tablet Commonly known as:  DESYREL Take 25 mg by mouth at bedtime as needed for sleep.       Review of Systems  Constitutional: Negative for activity change, appetite change, chills, fatigue and fever.  HENT: Negative for congestion, postnasal drip, rhinorrhea, sinus pressure, sneezing and sore throat.   Eyes: Negative.   Respiratory: Negative for cough, chest tightness, shortness of breath and wheezing.     Cardiovascular: Negative for chest pain, palpitations and leg swelling.  Gastrointestinal: Negative for abdominal distention, abdominal pain, constipation, diarrhea, nausea and vomiting.  Endocrine: Negative for cold intolerance, heat intolerance, polydipsia, polyphagia and polyuria.  Genitourinary: Negative for dysuria, frequency and urgency.  Musculoskeletal: Positive for gait problem.  Skin: Negative.        Right BKA. Left 2nd toe ulcer   Allergic/Immunologic: Negative.   Neurological: Negative for dizziness, seizures, syncope, light-headedness and headaches.  Psychiatric/Behavioral: Negative.     Immunization History  Administered Date(s) Administered  . Influenza,inj,Quad PF,36+ Mos 06/25/2015  . Influenza-Unspecified 10/03/2015  . PPD Test 10/03/2015, 12/22/2015  . Pneumococcal Polysaccharide-23 06/25/2015  . Pneumococcal-Unspecified 10/03/2015   Pertinent  Health Maintenance Due  Topic Date Due  . COLONOSCOPY  07/19/2016 (Originally 03/04/1992)  . INFLUENZA VACCINE  07/19/2017 (Originally 02/17/2016)  .  PNA vac Low Risk Adult (2 of 2 - PCV13) 10/02/2016      Vitals:   06/14/16 1130  BP: 132/70  Pulse: 70  Resp: 18  Temp: 97 F (36.1 C)  SpO2: 96%  Weight: 172 lb 3.2 oz (78.1 kg)  Height: 6\' 4"  (1.93 m)   Body mass index is 20.96 kg/m. Physical Exam  Constitutional: He is oriented to person, place, and time.  Thin frail Elderly in no acute distress.   HENT:  Head: Normocephalic.  Mouth/Throat: Oropharynx is clear and moist.  Eyes: Conjunctivae and EOM are normal. Pupils are equal, round, and reactive to light. Right eye exhibits no discharge. Left eye exhibits no discharge. No scleral icterus.  Neck: Normal range of motion. No JVD present. No thyromegaly present.  Cardiovascular: Normal rate, regular rhythm, normal heart sounds and intact distal pulses.  Exam reveals no gallop and no friction rub.   No murmur heard. Pulmonary/Chest: Effort normal and breath  sounds normal. No respiratory distress. He has no wheezes. He has no rales.  Abdominal: Soft. Bowel sounds are normal. He exhibits no distension and no mass. There is no tenderness. There is no rebound and no guarding.  Genitourinary:  Genitourinary Comments: Voids on urinal   Musculoskeletal: He exhibits no edema or tenderness.  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   Lymphadenopathy:    He has no cervical adenopathy.  Neurological: He is oriented to person, place, and time.  Skin: Skin is warm and dry. No rash noted. No erythema. No pallor.  R BKA. Left 2nd toe open wound managed by wound Nurse no drainage surrounding skin without any signs of infections though wound has increased in size.   Psychiatric: He has a normal mood and affect.    Labs reviewed:  Recent Labs  08/16/15 0349  10/01/15 1104  12/05/15 1155 12/30/15 01/01/16 02/09/16  NA 138  < > 143  < > 140 139 141 141  K 4.0  --  3.6  < > 4.8 5.5* 4.9 4.7  CL 106  --  108  --  104  --   --   --   CO2 25  --  22  --  26  --   --   --   GLUCOSE 85  --  102*  --  87  --   --   --   BUN 8  < > 22*  < > 22* 30* 29* 25*  CREATININE 1.22  < > 1.26*  < > 1.23 1.3 1.3 1.0  CALCIUM 8.4*  --  8.4*  --  9.1  --   --   --   < > = values in this interval not displayed.  Recent Labs  08/05/15 1505 08/13/15 1232 10/01/15 1104 10/07/15  AST 23 14* 17 10*  ALT 11* 12* 15* 5*  ALKPHOS 67 77 113 85  BILITOT 0.4 0.5 0.7  --   PROT 6.2* 5.7* 5.8*  --   ALBUMIN 3.2* 2.4* 2.5*  --     Recent Labs  08/09/15 2251  08/11/15 0554 08/13/15 1232 08/14/15 0830 10/01/15 1104  12/05/15 1155 12/30/15 02/09/16  WBC 9.0  < > 7.7 12.3* 8.5 17.6*  < > 10.5 10.1 6.4  NEUTROABS 6.6  --  4.7 9.1*  --   --   --   --   --   --   HGB 9.2*  < > 8.3* 9.9* 8.7* 9.3*  < > 9.7* 10.0*  10.0*  HCT 29.3*  < > 26.1* 31.4* 28.1* 29.3*  < > 33.0* 33* 33*  MCV 74.9*  < > 75.0* 74.9* 76.4* 74.4*  --  77.6*  --   --   PLT 331  < > 287 304 257 199  < >  347 342 323  < > = values in this interval not displayed. Lab Results  Component Value Date   TSH 3.56 03/15/2016   Lab Results  Component Value Date   HGBA1C 5.3 02/09/2016   Lab Results  Component Value Date   CHOL 129 02/09/2016   HDL 22 (A) 02/09/2016   LDLCALC 78 02/09/2016   TRIG 144 02/09/2016   Assessment/Plan 1. Hypothyroidism (acquired) Continue on Levothyroxine 12.5 mcg tablet. Monitor TSH level.   2. Chronic obstructive pulmonary disease, unspecified COPD type (HCC) Afebrile. Exam findings negative for cough or wheezing. Continue Albuterol twice daily.   3. Skin ulcer of second toe of left foot, limited to breakdown of skin  Afebrile. No improvement. Wound bed with yellow drainage. Current on antibiotic Cipro for UTI. Will continue to monitor. Will consider referral to wound center if worsening. Continue wound care.   4. Slow transit constipation Continue on Senna. Encourage oral and Fluid intake.   5. GERD without esophagitis Continue on Omeprazole  6. UTI  Afebrile.Symptoms have improved.Continue on Cipro 500 mg Tablet twice daily initiated 06/12/2016. Florastor 250 mg Capsule twice daily for diarrhea prevention.     Family/ staff Communication: Reviewed plan of care with patient and facility   Labs/tests ordered: none

## 2016-07-01 ENCOUNTER — Non-Acute Institutional Stay (SKILLED_NURSING_FACILITY): Payer: Medicare HMO | Admitting: Family

## 2016-07-01 DIAGNOSIS — L97521 Non-pressure chronic ulcer of other part of left foot limited to breakdown of skin: Secondary | ICD-10-CM | POA: Diagnosis not present

## 2016-07-01 NOTE — Progress Notes (Signed)
Location:  [facility] Room Number: 1102 A  Place of Service:  SNF (31) Provider: Lashante Fryberger FNP-C   [name], [name], MD  Patient Care Team: Oneal GroutMahima Pandey, MD as PCP - General (Internal Medicine) Runell GessJonathan J Berry, MD as Consulting Physician (Cardiology) Nadara MustardMarcus V Duda, MD as Consulting Physician (Orthopedic Surgery)  Extended Emergency Contact Information Primary Emergency Contact: [name],[name] Address: [address]          [place], [place] [address] [place] Home Phone: [phone] Mobile Phone: [phone] Relation: Niece Secondary Emergency Contact: Roena MaladyParis,Ernie  [place] Home Phone: [phone] Relation: Friend  Code Status: Full Code  Goals of care: Advanced Directive information Advanced Directives 04/06/2016  Does Patient Have a Medical Advance Directive? Yes  Type of Advance Directive (No Data)  Does patient want to make changes to medical advance directive? No - Patient declined  Copy of Healthcare Power of Attorney in Chart? Yes  Would patient like information on creating a medical advance directive? -     Chief Complaint  Patient presents with  . Acute Visit    left 2nd toe ulcer     HPI:  Pt is a 75 y.o. male seen today at [facility]  for an acute visit for evaluation of left 2nd toe ulcer. He has a significant medical history of PVD with right BKA among other conditions. He is seen in his room today per facility wound care Nurse request who reports left 2nd toe arterial ulcer is now dry with wound covered with dark eschar and seems to be more tender to touch. Patient states pain worse whenever toe is touched. He denies any fever or chills.    Past Medical History:  Diagnosis Date  . Adult failure to thrive    /notes 06/23/2015  . Anemia   . Arthritis    "hands" (06/23/2015)  . COPD (chronic obstructive pulmonary disease) (HCC)    LONG TIME SMOKER  . Critical lower limb  ischemia   . 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 Depression    "periods of depression" (06/23/2015)  . Gout   . Physical deconditioning   . Pyelonephritis 06/23/2015  . Status post foot surgery    right fifth toe amputation by Dr. Lajoyce Cornersuda    Past Surgical History:  Procedure Laterality Date  . AMPUTATION Right 03/01/2014   Procedure: AMPUTATION RAY;  Surgeon: Nadara MustardMarcus Hays V, MD;  Location: [facility] OR;  Service: Orthopedics;  Laterality: Right;  Right Foot 5th Ray Amputation  . AMPUTATION Right 04/26/2014   Procedure: Right Foot 4th Ray Amputation;  Surgeon: Nadara MustardMarcus Hays V, MD;  [facility];  Service: Orthopedics;  Laterality: Right;  . AMPUTATION Right 10/01/2015   Procedure: Right Below Knee Amputation;  Surgeon: Nadara MustardMarcus Hays V, MD;  [facility];  Service: Orthopedics;  Laterality: Right;  . ANKLE ARTHROSCOPY Right 08/13/2015   Procedure: ANKLE ARTHROSCOPY;  Surgeon: Nadara MustardMarcus Hays V, MD;  [facility];  Service: Orthopedics;  Laterality: Right;  . CATARACT EXTRACTION W/ INTRAOCULAR LENS  IMPLANT, BILATERAL Bilateral   . INGUINAL HERNIA REPAIR Left   . KNEE CARTILAGE SURGERY Left 1960's   football injury  . KNEE LIGAMENT RECONSTRUCTION Left 1960's  . LOWER EXTREMITY ANGIOGRAM N/A 05/27/2014   Procedure: LOWER EXTREMITY ANGIOGRAM;  Surgeon: Runell GessJonathan J Berry, MD;  [facility];  Service: Cardiovascular;  Laterality: N/A;  . STUMP REVISION  Right 12/05/2015   Procedure: Revision Right Below Knee Amputation;  Surgeon: Nadara Mustard, MD;  Location: [facility] OR;  Service: Orthopedics;  Laterality: Right;    No Known Allergies  Allergies as of 07/01/2016   No Known Allergies     Medication List       Accurate as of 07/01/16  3:44 PM. Always use your most recent med list.          albuterol 108 (90 Base) MCG/ACT inhaler Commonly known as:  PROVENTIL HFA;VENTOLIN HFA Inhale 2 puffs into the lungs 2 (two) times daily. Take every daily per niece     allopurinol 100 MG tablet Commonly known as:  ZYLOPRIM Take 100 mg by mouth daily.   cholecalciferol 1000 units tablet Commonly known as:  VITAMIN D Take 1,000 Units by mouth daily. Reported on 11/06/2015   famotidine 20 MG tablet Commonly known as:  PEPCID Take 20 mg by mouth daily. For GERD.   levothyroxine 25 MCG tablet Commonly known as:  SYNTHROID, LEVOTHROID Take 12.5 mcg by mouth daily before breakfast.   loperamide 2 MG tablet Commonly known as:  IMODIUM A-D Give 1 - 2 tablets ( 2mg  - 4mg ) after initial dose following  each loose stool following. Do not give if < 3 stools or Dx c.diff or norovirus. So do not exceed > 16 mg/24 hr period.   meloxicam 7.5 MG tablet Commonly known as:  MOBIC Take 7.5 mg by mouth daily.   methocarbamol 500 MG tablet Commonly known as:  ROBAXIN Take 500 mg by mouth every 6 (six) hours as needed for muscle spasms.   multivitamin with minerals tablet Take 1 tablet by mouth daily.   omeprazole 20 MG capsule Commonly known as:  PRILOSEC Take 20 mg by mouth daily.   oxyCODONE-acetaminophen 10-325 MG tablet Commonly known as:  PERCOCET Take one tablet every 4 hours as needed for moderate pain or severe pain DO NOT EXCEED 4GM OF APAP IN 24 HOURS FROM ALL SOURCES   senna 8.6 MG Tabs tablet Commonly known as:  SENOKOT Take 1 tablet (8.6 mg total) by mouth 2 (two) times daily.   traZODone 50 MG tablet Commonly known as:  DESYREL Take 25 mg by mouth at bedtime as needed for sleep.       Review of Systems  Constitutional: Negative for activity change, appetite change, chills, fatigue and fever.  HENT: Negative for congestion, postnasal drip, rhinorrhea, sinus pressure, sneezing and sore throat.   Eyes: Negative.   Respiratory: Negative for cough, chest tightness, shortness of breath and wheezing.   Cardiovascular: Negative for chest pain, palpitations and leg swelling.  Gastrointestinal: Negative for abdominal distention, abdominal  pain, constipation, diarrhea, nausea and vomiting.  Genitourinary: Negative for dysuria, frequency and urgency.  Musculoskeletal: Positive for gait problem.  Skin: Negative.        Right BKA. Left 2nd toe ulcer   Psychiatric/Behavioral: Negative.     Immunization History  Administered Date(s) Administered  . Influenza,inj,Quad PF,36+ Mos 06/25/2015  . Influenza-Unspecified 10/03/2015  . PPD Test 10/03/2015, 12/22/2015  . Pneumococcal Polysaccharide-23 06/25/2015  . Pneumococcal-Unspecified 10/03/2015   Pertinent  Health Maintenance Due  Topic Date Due  . COLONOSCOPY  07/19/2016 (Originally 03/04/1992)  . INFLUENZA VACCINE  07/19/2017 (Originally 02/17/2016)  . PNA vac Low Risk Adult (2 of 2 - PCV13) 10/02/2016      Vitals:   07/01/16 1130  BP: 128/77  Pulse: 68  Resp: 18  Temp: 98.5 F (36.9 C)  SpO2: 96%  Weight: 173 lb 3.2 oz (78.6 kg)  Height: 6\' 4"  (1.93 m)   Body mass index is 21.08 kg/m. Physical Exam  Constitutional: He is oriented to person, place, and time.  Thin frail elderly in no acute distress.   HENT:  Head: Normocephalic.  Mouth/Throat: Oropharynx is clear and moist.  Eyes: Conjunctivae and EOM are normal. Pupils are equal, round, and reactive to light. Right eye exhibits no discharge. Left eye exhibits no discharge. No scleral icterus.  Neck: Normal range of motion. No JVD present. No thyromegaly present.  Cardiovascular: Normal rate, regular rhythm, normal heart sounds and intact distal pulses.  Exam reveals no gallop and no friction rub.   No murmur heard. Pulmonary/Chest: Effort normal and breath sounds normal. No respiratory distress. He has no wheezes. He has no rales.  Abdominal: Soft. Bowel sounds are normal. He exhibits no distension and no mass. There is no tenderness. There is no rebound and no guarding.  Genitourinary:  Genitourinary Comments: Continent   Musculoskeletal: He exhibits no edema or tenderness.  Right BKA. Multiple tophus on  fingers noted.   Lymphadenopathy:    He has no cervical adenopathy.  Neurological: He is oriented to person, place, and time.  Skin: Skin is warm and dry. No rash noted. No erythema. No pallor.  R BKA. Left 2nd toe open wound covered with dark dry eschar surrounding skin without any signs of infections.  managed by wound Nurse no drainage.   Psychiatric: He has a normal mood and affect.    Labs reviewed:  Recent Labs  08/16/15 0349  10/01/15 1104  12/05/15 1155 12/30/15 01/01/16 02/09/16  NA 138  < > 143  < > 140 139 141 141  K 4.0  --  3.6  < > 4.8 5.5* 4.9 4.7  CL 106  --  108  --  104  --   --   --   CO2 25  --  22  --  26  --   --   --   GLUCOSE 85  --  102*  --  87  --   --   --   BUN 8  < > 22*  < > 22* 30* 29* 25*  CREATININE 1.22  < > 1.26*  < > 1.23 1.3 1.3 1.0  CALCIUM 8.4*  --  8.4*  --  9.1  --   --   --   < > = values in this interval not displayed.  Recent Labs  08/05/15 1505 08/13/15 1232 10/01/15 1104 10/07/15  AST 23 14* 17 10*  ALT 11* 12* 15* 5*  ALKPHOS 67 77 113 85  BILITOT 0.4 0.5 0.7  --   PROT 6.2* 5.7* 5.8*  --   ALBUMIN 3.2* 2.4* 2.5*  --     Recent Labs  08/09/15 2251  08/11/15 0554 08/13/15 1232 08/14/15 0830 10/01/15 1104  12/05/15 1155 12/30/15 02/09/16  WBC 9.0  < > 7.7 12.3* 8.5 17.6*  < > 10.5 10.1 6.4  NEUTROABS 6.6  --  4.7 9.1*  --   --   --   --   --   --   HGB 9.2*  < > 8.3* 9.9* 8.7* 9.3*  < > 9.7* 10.0* 10.0*  HCT 29.3*  < > 26.1* 31.4* 28.1* 29.3*  < > 33.0* 33* 33*  MCV 74.9*  < > 75.0* 74.9* 76.4* 74.4*  --  77.6*  --   --   PLT  331  < > 287 304 257 199  < > 347 342 323  < > = values in this interval not displayed. Lab Results  Component Value Date   TSH 3.56 03/15/2016   Lab Results  Component Value Date   HGBA1C 5.3 02/09/2016   Lab Results  Component Value Date   CHOL 129 02/09/2016   HDL 22 (A) 02/09/2016   LDLCALC 78 02/09/2016   TRIG 144 02/09/2016    Assessment/Plan   Skin ulcer of second toe of  left foot, limited to breakdown of skin (HCC) left 2nd toe arterial ulcer wound bed covered with dark eschar and tender to touch.surrounding tissues without any drainage. Refer to wound Center for evaluation.     Family/ staff Communication: Reviewed plan of care with patient and facility Nurse supervisor.   Labs/tests ordered: None

## 2016-07-13 ENCOUNTER — Encounter: Payer: Self-pay | Admitting: Family

## 2016-07-13 ENCOUNTER — Non-Acute Institutional Stay (SKILLED_NURSING_FACILITY): Payer: Medicare HMO | Admitting: Family

## 2016-07-13 DIAGNOSIS — K219 Gastro-esophageal reflux disease without esophagitis: Secondary | ICD-10-CM

## 2016-07-13 DIAGNOSIS — N3001 Acute cystitis with hematuria: Secondary | ICD-10-CM

## 2016-07-13 DIAGNOSIS — E039 Hypothyroidism, unspecified: Secondary | ICD-10-CM | POA: Diagnosis not present

## 2016-07-13 DIAGNOSIS — I739 Peripheral vascular disease, unspecified: Secondary | ICD-10-CM

## 2016-07-13 DIAGNOSIS — J449 Chronic obstructive pulmonary disease, unspecified: Secondary | ICD-10-CM | POA: Diagnosis not present

## 2016-07-13 NOTE — Progress Notes (Signed)
Location:  [facility] Room Number: 1102-A Place of Service:  SNF (31) Provider:  Dinah Ngetich FNP-C   [name], [name], MD  Patient Care Team: Oneal GroutMahima Pandey, MD as PCP - General (Internal Medicine) Runell GessJonathan J Berry, MD as Consulting Physician (Cardiology) Nadara MustardMarcus V Duda, MD as Consulting Physician (Orthopedic Surgery)  Extended Emergency Contact Information Primary Emergency Contact: [name],[name] Address: [address]          [place], [place] [address] [place] Home Phone: [phone] Mobile Phone: [phone] Relation: Niece Secondary Emergency Contact: Roena MaladyParis,Ernie  [place] Home Phone: [phone] Relation: Friend  Code Status: Full Code  Goals of care: Advanced Directive information Advanced Directives 04/06/2016  Does Patient Have a Medical Advance Directive? Yes  Type of Advance Directive (No Data)  Does patient want to make changes to medical advance directive? No - Patient declined  Copy of Healthcare Power of Attorney in Chart? Yes  Would patient like information on creating a medical advance directive? -     Chief Complaint  Patient presents with  . Medical Management of Chronic Issues    HPI:  Pt is a 75 y.o. male seen today at [facility]  for medical management of chronic diseases. He has a medical history of HTN, PVD, COPD, Hypothyroidism, Right BKA among other conditions. He is seen in his room today. He complains of burning sensation with urination.He also complains of heart burn after taking medication. Encouraged to take medication with apple sauce and remain in upright position. He denies any fever, chills,cough, nausea or vomiting. He was started on Cipro twice daily initiated by on call provider on 07/12/2016 for UTI. He has had no recent fall episodes, weight loss or recent hospital admission. Facility Nurse reports no new concerns.    Past Medical History:  Diagnosis Date   . Adult failure to thrive    /notes 06/23/2015  . Anemia   . Arthritis    "hands" (06/23/2015)  . COPD (chronic obstructive pulmonary disease) (HCC)    LONG TIME SMOKER  . Critical lower limb ischemia   . 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 Depression    "periods of depression" (06/23/2015)  . Gout   . Physical deconditioning   . Pyelonephritis 06/23/2015  . Status post foot surgery    right fifth toe amputation by Dr. Lajoyce Cornersuda    Past Surgical History:  Procedure Laterality Date  . AMPUTATION Right 03/01/2014   Procedure: AMPUTATION RAY;  Surgeon: Nadara MustardMarcus Duda V, MD;  Location: [facility] OR;  Service: Orthopedics;  Laterality: Right;  Right Foot 5th Ray Amputation  . AMPUTATION Right 04/26/2014   Procedure: Right Foot 4th Ray Amputation;  Surgeon: Nadara MustardMarcus Duda V, MD;  [facility];  Service: Orthopedics;  Laterality: Right;  . AMPUTATION Right 10/01/2015   Procedure: Right Below Knee Amputation;  Surgeon: Nadara MustardMarcus Duda V, MD;  [facility];  Service: Orthopedics;  Laterality: Right;  . ANKLE ARTHROSCOPY Right 08/13/2015   Procedure: ANKLE ARTHROSCOPY;  Surgeon: Nadara MustardMarcus Duda V, MD;  [facility];  Service: Orthopedics;  Laterality: Right;  . CATARACT EXTRACTION W/ INTRAOCULAR LENS  IMPLANT, BILATERAL Bilateral   . INGUINAL HERNIA REPAIR Left   . KNEE CARTILAGE SURGERY Left 1960's   football injury  . KNEE LIGAMENT RECONSTRUCTION Left 1960's  . LOWER EXTREMITY ANGIOGRAM N/A 05/27/2014   Procedure: LOWER EXTREMITY ANGIOGRAM;  Surgeon: Runell GessJonathan J Berry,  MD;  Location: [facility] CATH LAB;  Service: Cardiovascular;  Laterality: N/A;  . STUMP REVISION Right 12/05/2015   Procedure: Revision Right Below Knee Amputation;  Surgeon: Nadara MustardMarcus Duda V, MD;  [facility];  Service: Orthopedics;  Laterality: Right;    No Known Allergies  Allergies as of 07/13/2016   No Known Allergies     Medication List       Accurate as of 07/13/16  4:56 PM. Always use your most  recent med list.          albuterol 108 (90 Base) MCG/ACT inhaler Commonly known as:  PROVENTIL HFA;VENTOLIN HFA Inhale 2 puffs into the lungs 2 (two) times daily. Take every daily per niece   allopurinol 100 MG tablet Commonly known as:  ZYLOPRIM Take 100 mg by mouth daily.   cholecalciferol 1000 units tablet Commonly known as:  VITAMIN D Take 1,000 Units by mouth daily. Reported on 11/06/2015   ciprofloxacin 500 MG tablet Commonly known as:  CIPRO Take 500 mg by mouth 2 (two) times daily.   famotidine 20 MG tablet Commonly known as:  PEPCID Take 20 mg by mouth daily. For GERD.   levothyroxine 25 MCG tablet Commonly known as:  SYNTHROID, LEVOTHROID Take 12.5 mcg by mouth daily before breakfast.   loperamide 2 MG tablet Commonly known as:  IMODIUM A-D Give 1 - 2 tablets ( 2mg  - 4mg ) after initial dose following  each loose stool following. Do not give if < 3 stools or Dx c.diff or norovirus. So do not exceed > 16 mg/24 hr period.   meloxicam 7.5 MG tablet Commonly known as:  MOBIC Take 7.5 mg by mouth daily.   methocarbamol 500 MG tablet Commonly known as:  ROBAXIN Take 500 mg by mouth every 6 (six) hours as needed for muscle spasms.   multivitamin with minerals tablet Take 1 tablet by mouth daily.   omeprazole 20 MG capsule Commonly known as:  PRILOSEC Take 20 mg by mouth daily.   oxyCODONE-acetaminophen 10-325 MG tablet Commonly known as:  PERCOCET Take one tablet every 4 hours as needed for moderate pain or severe pain DO NOT EXCEED 4GM OF APAP IN 24 HOURS FROM ALL SOURCES   saccharomyces boulardii 250 MG capsule Commonly known as:  FLORASTOR Take 250 mg by mouth 2 (two) times daily.   senna 8.6 MG Tabs tablet Commonly known as:  SENOKOT Take 1 tablet (8.6 mg total) by mouth 2 (two) times daily.   traZODone 50 MG tablet Commonly known as:  DESYREL Take 50 mg by mouth at bedtime.       Review of Systems  Constitutional: Negative for activity  change, appetite change, chills, fatigue and fever.  HENT: Negative for congestion, postnasal drip, rhinorrhea, sinus pressure, sneezing and sore throat.   Eyes: Negative.   Respiratory: Negative for cough, chest tightness, shortness of breath and wheezing.   Cardiovascular: Negative for chest pain, palpitations and leg swelling.  Gastrointestinal: Negative for abdominal distention, abdominal pain, constipation, diarrhea, nausea and vomiting.  Endocrine: Negative for cold intolerance, heat intolerance, polydipsia, polyphagia and polyuria.  Genitourinary: Negative for dysuria, frequency and urgency.       Burning sensation with voiding   Musculoskeletal: Positive for gait problem.  Skin: Negative.        Right BKA. Left 2nd toe ulcer   Allergic/Immunologic: Negative.   Neurological: Negative for dizziness, seizures, syncope, light-headedness and headaches.  Hematological: Does not bruise/bleed easily.  Psychiatric/Behavioral: Negative for agitation, confusion, hallucinations and  sleep disturbance. The patient is not nervous/anxious.     Immunization History  Administered Date(s) Administered  . Influenza,inj,Quad PF,36+ Mos 06/25/2015  . PPD Test 10/03/2015, 10/17/2015  . Pneumococcal Polysaccharide-23 06/25/2015   Pertinent  Health Maintenance Due  Topic Date Due  . COLONOSCOPY  07/19/2016 (Originally 03/04/1992)  . INFLUENZA VACCINE  07/19/2017 (Originally 02/17/2016)  . PNA vac Low Risk Adult (2 of 2 - PCV13) 10/02/2016      Vitals:   07/13/16 1311  BP: 128/77  Pulse: 68  Resp: 18  Temp: 98.5 F (36.9 C)  TempSrc: Oral  SpO2: 96%  Weight: 173 lb 4.5 oz (78.6 kg)  Height: 6\' 4"  (1.93 m)   Body mass index is 21.09 kg/m. Physical Exam  Constitutional: He is oriented to person, place, and time.  Thin frail Elderly in no acute distress.   HENT:  Head: Normocephalic.  Mouth/Throat: Oropharynx is clear and moist.  Eyes: Conjunctivae and EOM are normal. Pupils are equal,  round, and reactive to light. Right eye exhibits no discharge. Left eye exhibits no discharge. No scleral icterus.  Neck: Normal range of motion. No JVD present. No thyromegaly present.  Cardiovascular: Normal rate, regular rhythm, normal heart sounds and intact distal pulses.  Exam reveals no gallop and no friction rub.   No murmur heard. Pulmonary/Chest: Effort normal and breath sounds normal. No respiratory distress. He has no wheezes. He has no rales.  Abdominal: Soft. Bowel sounds are normal. He exhibits no distension and no mass. There is no tenderness. There is no rebound and no guarding.  Genitourinary:  Genitourinary Comments: Voids on urinal   Musculoskeletal: He exhibits no edema or tenderness.  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   Lymphadenopathy:    He has no cervical adenopathy.  Neurological: He is oriented to person, place, and time.  Skin: Skin is warm and dry. No rash noted. No erythema. No pallor.  R BKA. Left 2nd toe open wound managed by wound Nurse no drainage surrounding skin without any signs of infections.   Psychiatric: He has a normal mood and affect.    Labs reviewed:  Recent Labs  08/16/15 0349  10/01/15 1104  12/05/15 1155  01/01/16 02/09/16 04/12/16  NA 138  < > 143  < > 140  < > 141 141 141  K 4.0  --  3.6  < > 4.8  < > 4.9 4.7 4.3  CL 106  --  108  --  104  --   --   --   --   CO2 25  --  22  --  26  --   --   --   --   GLUCOSE 85  --  102*  --  87  --   --   --   --   BUN 8  < > 22*  < > 22*  < > 29* 25* 23*  CREATININE 1.22  < > 1.26*  < > 1.23  < > 1.3 1.0 1.0  CALCIUM 8.4*  --  8.4*  --  9.1  --   --   --   --   < > = values in this interval not displayed.  Recent Labs  08/05/15 1505 08/13/15 1232 10/01/15 1104 10/07/15 04/12/16  AST 23 14* 17 10* 9*  ALT 11* 12* 15* 5* 5*  ALKPHOS 67 77 113 85 70  BILITOT 0.4 0.5 0.7  --   --   PROT 6.2* 5.7* 5.8*  --   --  ALBUMIN 3.2* 2.4* 2.5*  --   --     Recent Labs  08/11/15 0554 08/13/15 1232  08/14/15 0830 10/01/15 1104  12/05/15 1155 12/30/15 02/09/16 04/12/16  WBC 7.7 12.3* 8.5 17.6*  < > 10.5 10.1 6.4 8.0  NEUTROABS 4.7 9.1*  --   --   --   --   --   --  3  HGB 8.3* 9.9* 8.7* 9.3*  < > 9.7* 10.0* 10.0* 10.3*  HCT 26.1* 31.4* 28.1* 29.3*  < > 33.0* 33* 33* 34*  MCV 75.0* 74.9* 76.4* 74.4*  --  77.6*  --   --   --   PLT 287 304 257 199  < > 347 342 323 321  < > = values in this interval not displayed. Lab Results  Component Value Date   TSH 3.56 03/15/2016   Lab Results  Component Value Date   HGBA1C 5.3 02/09/2016   Lab Results  Component Value Date   CHOL 129 02/09/2016   HDL 22 (A) 02/09/2016   LDLCALC 78 02/09/2016   TRIG 144 02/09/2016   Assessment/Plan 1. Chronic obstructive pulmonary disease, unspecified COPD type (HCC) Afebrile. Exam findings negative for cough or wheezing. Continue Albuterol twice daily.   2. PVD  HX of right BKA.Currently has ulcer on left 2nd toe managed by wound Nurse awaiting wound center appointment.Wound bed dry scab tender to touch. Continue wound care.   3. Hypothyroidism  Continue levothyroxine. Monitor TSH level.   4. UTI  Reports burning sensation with voiding.Cipro 500 mg Tablet twice daily with Florastor 250 mg Capsule twice daily for diarrhea prevention was initiated by on call provider. Hands and peri-care hygiene after using the urinal discussed with patient.Facility Supervisor notified to provide wipes to use after voiding. Patient uses urinal while the bed and does not wash his hands.  5. GERD Worse after taking medication. Encouraged to take medication with apple sauce and remain in upright position for half an hour. Continue on omeprazole.     Family/ staff Communication: Reviewed plan of care with patient and facility   Labs/tests ordered: none

## 2016-07-22 ENCOUNTER — Inpatient Hospital Stay (HOSPITAL_COMMUNITY)
Admission: EM | Admit: 2016-07-22 | Discharge: 2016-07-28 | DRG: 504 | Disposition: A | Discharge status: Home / Self Care | Payer: Medicare HMO | Attending: Internal Medicine | Admitting: Internal Medicine

## 2016-07-22 ENCOUNTER — Encounter (HOSPITAL_BASED_OUTPATIENT_CLINIC_OR_DEPARTMENT_OTHER): Payer: Medicare HMO | Attending: Internal Medicine

## 2016-07-22 ENCOUNTER — Emergency Department (HOSPITAL_COMMUNITY): Payer: Medicare HMO

## 2016-07-22 ENCOUNTER — Encounter (HOSPITAL_COMMUNITY): Payer: Self-pay | Admitting: Emergency Medicine

## 2016-07-22 DIAGNOSIS — L089 Local infection of the skin and subcutaneous tissue, unspecified: Secondary | ICD-10-CM | POA: Diagnosis present

## 2016-07-22 DIAGNOSIS — Z89511 Acquired absence of right leg below knee: Secondary | ICD-10-CM | POA: Insufficient documentation

## 2016-07-22 DIAGNOSIS — I96 Gangrene, not elsewhere classified: Secondary | ICD-10-CM | POA: Diagnosis not present

## 2016-07-22 DIAGNOSIS — I10 Essential (primary) hypertension: Secondary | ICD-10-CM | POA: Insufficient documentation

## 2016-07-22 DIAGNOSIS — Z79899 Other long term (current) drug therapy: Secondary | ICD-10-CM

## 2016-07-22 DIAGNOSIS — K219 Gastro-esophageal reflux disease without esophagitis: Secondary | ICD-10-CM | POA: Diagnosis present

## 2016-07-22 DIAGNOSIS — J449 Chronic obstructive pulmonary disease, unspecified: Secondary | ICD-10-CM | POA: Insufficient documentation

## 2016-07-22 DIAGNOSIS — M86072 Acute hematogenous osteomyelitis, left ankle and foot: Secondary | ICD-10-CM | POA: Diagnosis not present

## 2016-07-22 DIAGNOSIS — Z9889 Other specified postprocedural states: Secondary | ICD-10-CM

## 2016-07-22 DIAGNOSIS — I739 Peripheral vascular disease, unspecified: Secondary | ICD-10-CM | POA: Diagnosis present

## 2016-07-22 DIAGNOSIS — N183 Chronic kidney disease, stage 3 unspecified: Secondary | ICD-10-CM | POA: Diagnosis present

## 2016-07-22 DIAGNOSIS — I998 Other disorder of circulatory system: Secondary | ICD-10-CM | POA: Diagnosis present

## 2016-07-22 DIAGNOSIS — M1A9XX1 Chronic gout, unspecified, with tophus (tophi): Secondary | ICD-10-CM | POA: Diagnosis present

## 2016-07-22 DIAGNOSIS — Z8249 Family history of ischemic heart disease and other diseases of the circulatory system: Secondary | ICD-10-CM

## 2016-07-22 DIAGNOSIS — E039 Hypothyroidism, unspecified: Secondary | ICD-10-CM | POA: Diagnosis present

## 2016-07-22 DIAGNOSIS — L97521 Non-pressure chronic ulcer of other part of left foot limited to breakdown of skin: Secondary | ICD-10-CM | POA: Insufficient documentation

## 2016-07-22 DIAGNOSIS — Z961 Presence of intraocular lens: Secondary | ICD-10-CM | POA: Diagnosis present

## 2016-07-22 DIAGNOSIS — N390 Urinary tract infection, site not specified: Secondary | ICD-10-CM | POA: Diagnosis present

## 2016-07-22 DIAGNOSIS — L97524 Non-pressure chronic ulcer of other part of left foot with necrosis of bone: Secondary | ICD-10-CM | POA: Insufficient documentation

## 2016-07-22 DIAGNOSIS — Z87891 Personal history of nicotine dependence: Secondary | ICD-10-CM

## 2016-07-22 DIAGNOSIS — Z9842 Cataract extraction status, left eye: Secondary | ICD-10-CM

## 2016-07-22 DIAGNOSIS — Z9841 Cataract extraction status, right eye: Secondary | ICD-10-CM

## 2016-07-22 DIAGNOSIS — M869 Osteomyelitis, unspecified: Secondary | ICD-10-CM | POA: Diagnosis not present

## 2016-07-22 DIAGNOSIS — R262 Difficulty in walking, not elsewhere classified: Secondary | ICD-10-CM

## 2016-07-22 DIAGNOSIS — E875 Hyperkalemia: Secondary | ICD-10-CM | POA: Diagnosis present

## 2016-07-22 LAB — CBC WITH DIFFERENTIAL/PLATELET
BASOS ABS: 0.7 10*3/uL — AB (ref 0.0–0.1)
BASOS PCT: 4 %
EOS ABS: 0 10*3/uL (ref 0.0–0.7)
Eosinophils Relative: 0 %
HCT: 33.8 % — ABNORMAL LOW (ref 39.0–52.0)
Hemoglobin: 10.6 g/dL — ABNORMAL LOW (ref 13.0–17.0)
LYMPHS ABS: 1.7 10*3/uL (ref 0.7–4.0)
LYMPHS PCT: 10 %
MCH: 22.8 pg — AB (ref 26.0–34.0)
MCHC: 31.4 g/dL (ref 30.0–36.0)
MCV: 72.7 fL — AB (ref 78.0–100.0)
MONO ABS: 4.5 10*3/uL — AB (ref 0.1–1.0)
Monocytes Relative: 26 %
NEUTROS ABS: 10.3 10*3/uL — AB (ref 1.7–7.7)
Neutrophils Relative %: 60 %
Platelets: 293 10*3/uL (ref 150–400)
RBC: 4.65 MIL/uL (ref 4.22–5.81)
RDW: 19 % — ABNORMAL HIGH (ref 11.5–15.5)
WBC: 17.2 10*3/uL — ABNORMAL HIGH (ref 4.0–10.5)

## 2016-07-22 LAB — BASIC METABOLIC PANEL
Anion gap: 9 (ref 5–15)
BUN: 32 mg/dL — AB (ref 6–20)
CO2: 22 mmol/L (ref 22–32)
CREATININE: 1.53 mg/dL — AB (ref 0.61–1.24)
Calcium: 9.2 mg/dL (ref 8.9–10.3)
Chloride: 106 mmol/L (ref 101–111)
GFR calc Af Amer: 50 mL/min — ABNORMAL LOW (ref >60)
GFR, EST NON AFRICAN AMERICAN: 43 mL/min — AB (ref >60)
GLUCOSE: 89 mg/dL (ref 65–99)
Potassium: 5.3 mmol/L — ABNORMAL HIGH (ref 3.5–5.1)
Sodium: 137 mmol/L (ref 135–145)

## 2016-07-22 LAB — C-REACTIVE PROTEIN: CRP: 1.7 mg/dL — AB (ref <1.0)

## 2016-07-22 LAB — URINALYSIS, ROUTINE W REFLEX MICROSCOPIC
BILIRUBIN URINE: NEGATIVE
Glucose, UA: NEGATIVE mg/dL
Ketones, ur: NEGATIVE mg/dL
Nitrite: NEGATIVE
PROTEIN: 30 mg/dL — AB
SPECIFIC GRAVITY, URINE: 1.025 (ref 1.005–1.030)
pH: 5.5 (ref 5.0–8.0)

## 2016-07-22 LAB — URINALYSIS, MICROSCOPIC (REFLEX)

## 2016-07-22 LAB — SEDIMENTATION RATE: Sed Rate: 25 mm/hr — ABNORMAL HIGH (ref 0–16)

## 2016-07-22 MED ORDER — KETOROLAC TROMETHAMINE 15 MG/ML IJ SOLN
15.0000 mg | Freq: Four times a day (QID) | INTRAMUSCULAR | Status: DC | PRN
  Administered 2016-07-23: 15 mg via INTRAVENOUS
  Filled 2016-07-22: qty 1

## 2016-07-22 MED ORDER — ALBUTEROL SULFATE (2.5 MG/3ML) 0.083% IN NEBU
2.5000 mg | INHALATION_SOLUTION | Freq: Two times a day (BID) | RESPIRATORY_TRACT | Status: DC
  Filled 2016-07-22: qty 3

## 2016-07-22 MED ORDER — SODIUM CHLORIDE 0.9% FLUSH: 3.0000 mL | INTRAVENOUS | Status: DC | PRN

## 2016-07-22 MED ORDER — CLINDAMYCIN PHOSPHATE 600 MG/50ML IV SOLN: 600.0000 mg | Freq: Once | INTRAVENOUS | Status: DC

## 2016-07-22 MED ORDER — PIPERACILLIN-TAZOBACTAM 3.375 G IVPB
3.3750 g | Freq: Three times a day (TID) | INTRAVENOUS | Status: DC
Start: 2016-07-22 — End: 2016-07-24
  Administered 2016-07-23 – 2016-07-24 (×5): 3.375 g via INTRAVENOUS
  Filled 2016-07-22 (×6): qty 50

## 2016-07-22 MED ORDER — LEVOTHYROXINE SODIUM 25 MCG PO TABS
12.5000 ug | ORAL_TABLET | Freq: Every day | ORAL | Status: DC
  Administered 2016-07-24 – 2016-07-28 (×5): 12.5 ug via ORAL
  Filled 2016-07-22 (×5): qty 1

## 2016-07-22 MED ORDER — VITAMIN D 1000 UNITS PO TABS
1000.0000 [IU] | ORAL_TABLET | Freq: Every day | ORAL | Status: DC
  Administered 2016-07-24 – 2016-07-28 (×5): 1000 [IU] via ORAL
  Filled 2016-07-22 (×7): qty 1

## 2016-07-22 MED ORDER — OXYCODONE HCL 5 MG PO TABS
5.0000 mg | ORAL_TABLET | ORAL | Status: DC | PRN
  Administered 2016-07-22 – 2016-07-28 (×21): 5 mg via ORAL
  Filled 2016-07-22 (×22): qty 1

## 2016-07-22 MED ORDER — PANTOPRAZOLE SODIUM 40 MG PO TBEC
40.0000 mg | DELAYED_RELEASE_TABLET | Freq: Every day | ORAL | Status: DC
  Administered 2016-07-24 – 2016-07-28 (×5): 40 mg via ORAL
  Filled 2016-07-22 (×5): qty 1

## 2016-07-22 MED ORDER — VANCOMYCIN HCL 10 G IV SOLR: 20.0000 mg/kg | Freq: Once | INTRAVENOUS | Status: DC

## 2016-07-22 MED ORDER — OXYCODONE-ACETAMINOPHEN 10-325 MG PO TABS: 1.0000 | ORAL_TABLET | ORAL | Status: DC | PRN

## 2016-07-22 MED ORDER — SODIUM POLYSTYRENE SULFONATE 15 GM/60ML PO SUSP
15.0000 g | Freq: Once | ORAL | Status: DC
  Filled 2016-07-22: qty 60

## 2016-07-22 MED ORDER — HEPARIN SODIUM (PORCINE) 5000 UNIT/ML IJ SOLN
5000.0000 [IU] | Freq: Three times a day (TID) | INTRAMUSCULAR | Status: DC
  Administered 2016-07-22 – 2016-07-28 (×15): 5000 [IU] via SUBCUTANEOUS
  Filled 2016-07-22 (×15): qty 1

## 2016-07-22 MED ORDER — SODIUM CHLORIDE 0.9% FLUSH
3.0000 mL | Freq: Two times a day (BID) | INTRAVENOUS | Status: DC
  Administered 2016-07-22 – 2016-07-28 (×9): 3 mL via INTRAVENOUS

## 2016-07-22 MED ORDER — HYDROCODONE-ACETAMINOPHEN 5-325 MG PO TABS
1.0000 | ORAL_TABLET | Freq: Once | ORAL | Status: AC
  Administered 2016-07-22: 1 via ORAL
  Filled 2016-07-22: qty 1

## 2016-07-22 MED ORDER — PIPERACILLIN-TAZOBACTAM 3.375 G IVPB 30 MIN
3.3750 g | Freq: Once | INTRAVENOUS | Status: AC
  Administered 2016-07-22: 3.375 g via INTRAVENOUS
  Filled 2016-07-22: qty 50

## 2016-07-22 MED ORDER — SODIUM CHLORIDE 0.9 % IV SOLN: 250.0000 mL | INTRAVENOUS | Status: DC | PRN

## 2016-07-22 MED ORDER — ADULT MULTIVITAMIN W/MINERALS CH
1.0000 | ORAL_TABLET | Freq: Every day | ORAL | Status: DC
  Administered 2016-07-24 – 2016-07-28 (×5): 1 via ORAL
  Filled 2016-07-22 (×6): qty 1

## 2016-07-22 MED ORDER — VANCOMYCIN HCL IN DEXTROSE 750-5 MG/150ML-% IV SOLN
750.0000 mg | Freq: Two times a day (BID) | INTRAVENOUS | Status: DC
  Administered 2016-07-23 – 2016-07-24 (×3): 750 mg via INTRAVENOUS
  Filled 2016-07-22 (×4): qty 150

## 2016-07-22 MED ORDER — VANCOMYCIN HCL 10 G IV SOLR
1500.0000 mg | Freq: Once | INTRAVENOUS | Status: AC
  Administered 2016-07-22: 1500 mg via INTRAVENOUS
  Filled 2016-07-22: qty 1500

## 2016-07-22 MED ORDER — TRAZODONE HCL 50 MG PO TABS
50.0000 mg | ORAL_TABLET | Freq: Every day | ORAL | Status: DC
  Administered 2016-07-22 – 2016-07-27 (×6): 50 mg via ORAL
  Filled 2016-07-22 (×6): qty 1

## 2016-07-22 MED ORDER — OXYCODONE-ACETAMINOPHEN 5-325 MG PO TABS
1.0000 | ORAL_TABLET | ORAL | Status: DC | PRN
  Administered 2016-07-22 – 2016-07-28 (×21): 1 via ORAL
  Filled 2016-07-22 (×21): qty 1

## 2016-07-22 MED ORDER — METHOCARBAMOL 500 MG PO TABS
500.0000 mg | ORAL_TABLET | Freq: Four times a day (QID) | ORAL | Status: DC | PRN
  Administered 2016-07-23 – 2016-07-28 (×3): 500 mg via ORAL
  Filled 2016-07-22 (×8): qty 1

## 2016-07-22 MED ORDER — SENNA 8.6 MG PO TABS
1.0000 | ORAL_TABLET | Freq: Two times a day (BID) | ORAL | Status: DC
  Administered 2016-07-22 – 2016-07-28 (×5): 8.6 mg via ORAL
  Filled 2016-07-22 (×10): qty 1

## 2016-07-22 NOTE — ED Triage Notes (Signed)
Pt brought to ED via [facility] from wound center ref. gangrene in toes on left foot.  Pt has right BKA due to PVD.  Pt st's he has been having pain in left foot for 2 months.  Pt alert and oriented x's 3

## 2016-07-22 NOTE — H&P (Signed)
History and Physical    Caleb MarinerJerald J Fernandez ZOX:[id number] DOB: 24-Nov-1940 DOA: 07/22/2016  PCP: [name], [name], MD  Patient coming from: home  Chief Complaint: Toe infection  HPI: Caleb Hays is a 76 y.o. male with medical history significant of tobacco abuse, hypothyroidism, gerd, copd who presents with a 432 month old toe wound. The problem has been persistent. Not getting better. He went to wound care today as outpatient and was referred here. Nothing he is aware of makes the problem better or worse.   ED Course: Pt found to have leukocytosis with x ray of foot reporting suspicion for osteomyelitis. We were subsequently consulted  Review of Systems: As per HPI otherwise 10 point review of systems negative.    Past Medical History:  Diagnosis Date  . Adult failure to thrive    /notes 06/23/2015  . Anemia   . Arthritis    "hands" (06/23/2015)  . COPD (chronic obstructive pulmonary disease) (HCC)    LONG TIME SMOKER  . Critical lower limb ischemia   . 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 Depression    "periods of depression" (06/23/2015)  . Gout   . Physical deconditioning   . Pyelonephritis 06/23/2015  . Status post foot surgery    right fifth toe amputation by Dr. Lajoyce Cornersuda     Past Surgical History:  Procedure Laterality Date  . AMPUTATION Right 03/01/2014   Procedure: AMPUTATION RAY;  Surgeon: Nadara MustardMarcus Duda V, MD;  Location: [facility] OR;  Service: Orthopedics;  Laterality: Right;  Right Foot 5th Ray Amputation  . AMPUTATION Right 04/26/2014   Procedure: Right Foot 4th Ray Amputation;  Surgeon: Nadara MustardMarcus Duda V, MD;  [facility];  Service: Orthopedics;  Laterality: Right;  . AMPUTATION Right 10/01/2015   Procedure: Right Below Knee Amputation;  Surgeon: Nadara MustardMarcus Duda V, MD;  [facility];  Service: Orthopedics;  Laterality: Right;  . ANKLE ARTHROSCOPY Right 08/13/2015   Procedure: ANKLE ARTHROSCOPY;  Surgeon: Nadara MustardMarcus Duda V, MD;  [facility];   Service: Orthopedics;  Laterality: Right;  . CATARACT EXTRACTION W/ INTRAOCULAR LENS  IMPLANT, BILATERAL Bilateral   . INGUINAL HERNIA REPAIR Left   . KNEE CARTILAGE SURGERY Left 1960's   football injury  . KNEE LIGAMENT RECONSTRUCTION Left 1960's  . LOWER EXTREMITY ANGIOGRAM N/A 05/27/2014   Procedure: LOWER EXTREMITY ANGIOGRAM;  Surgeon: Runell GessJonathan J Berry, MD;  [facility];  Service: Cardiovascular;  Laterality: N/A;  . STUMP REVISION Right 12/05/2015   Procedure: Revision Right Below Knee Amputation;  Surgeon: Nadara MustardMarcus Duda V, MD;  [facility];  Service: Orthopedics;  Laterality: Right;     reports that he has quit smoking. His smoking use included Cigarettes. He has a 14.25 pack-year smoking history. He has never used smokeless tobacco. He reports that he drinks alcohol. He reports that he does not use drugs.  No Known Allergies  Family History  Problem Relation Age of Onset  . Heart failure Father   . Heart attack Father   . Heart failure Brother     Prior to Admission medications   Medication Sig Start Date End Date Taking? Authorizing Provider  albuterol (PROVENTIL HFA;VENTOLIN HFA) 108 (90 BASE) MCG/ACT inhaler Inhale 2 puffs into the lungs 2 (two) times daily. Take every daily per niece    Historical Provider, MD  allopurinol (ZYLOPRIM) 100 MG tablet Take 100 mg by mouth daily.     Historical Provider, MD  cholecalciferol (VITAMIN  D) 1000 units tablet Take 1,000 Units by mouth daily. Reported on 11/06/2015    Historical Provider, MD  famotidine (PEPCID) 20 MG tablet Take 20 mg by mouth daily. For GERD.    Historical Provider, MD  levothyroxine (SYNTHROID, LEVOTHROID) 25 MCG tablet Take 12.5 mcg by mouth daily before breakfast.     Historical Provider, MD  loperamide (IMODIUM A-D) 2 MG tablet Give 1 - 2 tablets ( 2mg  - 4mg ) after initial dose following  each loose stool following. Do not give if < 3 stools or Dx c.diff or norovirus. So do not exceed > 16 mg/24 hr period.     Historical Provider, MD  meloxicam (MOBIC) 7.5 MG tablet Take 7.5 mg by mouth daily.     Historical Provider, MD  methocarbamol (ROBAXIN) 500 MG tablet Take 500 mg by mouth every 6 (six) hours as needed for muscle spasms.    Historical Provider, MD  Multiple Vitamins-Minerals (MULTIVITAMIN WITH MINERALS) tablet Take 1 tablet by mouth daily.    Historical Provider, MD  omeprazole (PRILOSEC) 20 MG capsule Take 20 mg by mouth daily.     Historical Provider, MD  oxyCODONE-acetaminophen (PERCOCET) 10-325 MG tablet Take one tablet every 4 hours as needed for moderate pain or severe pain DO NOT EXCEED 4GM OF APAP IN 24 HOURS FROM ALL SOURCES 06/14/16   Tiffany L Reed, DO  saccharomyces boulardii (FLORASTOR) 250 MG capsule Take 250 mg by mouth 2 (two) times daily. 07/12/16 07/22/16  Historical Provider, MD  senna (SENOKOT) 8.6 MG TABS tablet Take 1 tablet (8.6 mg total) by mouth 2 (two) times daily. 08/16/15   Meredeth Ide, MD  traZODone (DESYREL) 50 MG tablet Take 50 mg by mouth at bedtime.     Historical Provider, MD    Physical Exam: Vitals:   07/22/16 1600 07/22/16 1746 07/22/16 1800 07/22/16 1815  BP: 143/85 143/75 142/93 173/71  Pulse: 63 71 73 72  Resp: 14 16    Temp:      TempSrc:      SpO2: 100% 99% 96% 99%  Weight:      Height:       Constitutional: NAD, calm, comfortable Vitals:   07/22/16 1600 07/22/16 1746 07/22/16 1800 07/22/16 1815  BP: 143/85 143/75 142/93 173/71  Pulse: 63 71 73 72  Resp: 14 16    Temp:      TempSrc:      SpO2: 100% 99% 96% 99%  Weight:      Height:       Eyes: PERRL, lids and conjunctivae normal ENMT: Mucous membranes are moist. Posterior pharynx clear of any exudate or lesions. Neck: normal, supple, no masses, no thyromegaly Respiratory: clear to auscultation bilaterally, no wheezing, no crackles. Normal respiratory effort. No accessory muscle use.  Cardiovascular: Regular rate and rhythm, no murmurs / rubs / gallops. No extremity edema.  Abdomen:  no tenderness, no masses palpated. No hepatosplenomegaly. Bowel sounds positive.  Musculoskeletal: no clubbing / cyanosis.  Skin: ulcer and cellulitis over dorsal 2nd toe of left foot Neurologic: CN 3-12 grossly intact. Sensation intact  Psychiatric: Normal judgment and insight. Alert and oriented x 3. Normal mood.    Labs on Admission: I have personally reviewed following labs and imaging studies  CBC:  Recent Labs Lab 07/22/16 1626  WBC 17.2*  NEUTROABS 10.3*  HGB 10.6*  HCT 33.8*  MCV 72.7*  PLT 293   Basic Metabolic Panel:  Recent Labs Lab 07/22/16 1626  NA 137  K  5.3*  CL 106  CO2 22  GLUCOSE 89  BUN 32*  CREATININE 1.53*  CALCIUM 9.2   GFR: Estimated Creatinine Clearance: 47.3 mL/min (by C-G formula based on SCr of 1.53 mg/dL (H)). Liver Function Tests: No results for input(s): AST, ALT, ALKPHOS, BILITOT, PROT, ALBUMIN in the last 168 hours. No results for input(s): LIPASE, AMYLASE in the last 168 hours. No results for input(s): AMMONIA in the last 168 hours. Coagulation Profile: No results for input(s): INR, PROTIME in the last 168 hours. Cardiac Enzymes: No results for input(s): CKTOTAL, CKMB, CKMBINDEX, TROPONINI in the last 168 hours. BNP (last 3 results) No results for input(s): PROBNP in the last 8760 hours. HbA1C: No results for input(s): HGBA1C in the last 72 hours. CBG: No results for input(s): GLUCAP in the last 168 hours. Lipid Profile: No results for input(s): CHOL, HDL, LDLCALC, TRIG, CHOLHDL, LDLDIRECT in the last 72 hours. Thyroid Function Tests: No results for input(s): TSH, T4TOTAL, FREET4, T3FREE, THYROIDAB in the last 72 hours. Anemia Panel: No results for input(s): VITAMINB12, FOLATE, FERRITIN, TIBC, IRON, RETICCTPCT in the last 72 hours. Urine analysis:    Component Value Date/Time   COLORURINE YELLOW 07/22/2016 1631   APPEARANCEUR CLOUDY (A) 07/22/2016 1631   LABSPEC 1.025 07/22/2016 1631   PHURINE 5.5 07/22/2016 1631    GLUCOSEU NEGATIVE 07/22/2016 1631   HGBUR MODERATE (A) 07/22/2016 1631   BILIRUBINUR NEGATIVE 07/22/2016 1631   KETONESUR NEGATIVE 07/22/2016 1631   PROTEINUR 30 (A) 07/22/2016 1631   UROBILINOGEN >8.0 (H) 05/14/2012 1805   NITRITE NEGATIVE 07/22/2016 1631   LEUKOCYTESUR LARGE (A) 07/22/2016 1631   Sepsis Labs: !!!!!!!!!!!!!!!!!!!!!!!!!!!!!!!!!!!!!!!!!!!! @LABRCNTIP (procalcitonin:4,lacticidven:4) )No results found for this or any previous visit (from the past 240 hour(s)).   Radiological Exams on Admission: Dg Foot 2 Views Left  Result Date: 07/22/2016 CLINICAL DATA:  Open wound on the second toe, evaluate for osteomyelitis EXAM: LEFT FOOT - 2 VIEW COMPARISON:  None. FINDINGS: There is air in the soft tissues of the distal left second toe which could be due to an open wound, but infection cannot be excluded. There does appear to be some loss of the cortical margin of the tuft of the distal phalanx along its lateral aspect and osteomyelitis cannot be excluded. The left second toe is not seen on the lateral view in detail. Tarsal metatarsal alignment is normal. Joint spaces appear normal. No erosion is seen. IMPRESSION: Cannot exclude osteomyelitis involving the distal phalanx of the left second toe. Electronically Signed   By: Dwyane Dee M.D.   On: 07/22/2016 17:02    EKG: placed order for pre op ekg  Assessment/Plan   Osteomyelitis (HCC)/ Toe infection - Place on IV antibiotics vanc and zosyn - Ortho consulted and will see in AM - preop EKG ordered  Active Problems:   COPD (chronic obstructive pulmonary disease) (HCC) - stable, continue home medication regimen    Hypothyroidism (acquired) - continue synthroid, stable no hypothyroidism symptoms reported    GERD without esophagitis  - stable continue PPI  Hyperkalemia - Will provide kayexalate - reassess next am  DVT prophylaxis: heparin Code Status: full Family Communication: none at bedside Disposition Plan: med  suirg Consults called: Ortho [place] ([facility]) Admission status: obs   Penny Pia MD [facility] Pager [phone]  If 7PM-7AM, please contact night-coverage [email or web address] Password [facility]  07/22/2016, 6:24 PM

## 2016-07-22 NOTE — ED Provider Notes (Signed)
[facility]-EMERGENCY DEPT Provider Note   CSN: [id number] Arrival date & time: 07/22/16  1522     History   Chief Complaint Chief Complaint  Patient presents with  . Foot Pain    HPI Caleb Hays is a 76 y.o. male.  -States over the last 2 months she has had worsening wound on his left second toe. Patient went to the wound clinic for the first time today and was sent here for evaluation. Patient denies any fevers or chills at home. Patient also complaining of moderate pain involving the left second toe that is dull in nature.   The history is provided by the patient.  Wound Check  This is a new problem. Episode onset: 2 month. The problem has been gradually worsening. Pertinent negatives include no chest pain, no abdominal pain, no headaches and no shortness of breath.    Past Medical History:  Diagnosis Date  . Adult failure to thrive    /notes 06/23/2015  . Anemia   . Arthritis    "hands" (06/23/2015)  . COPD (chronic obstructive pulmonary disease) (HCC)    LONG TIME SMOKER  . Critical lower limb ischemia   . 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 Depression    "periods of depression" (06/23/2015)  . Gout   . Physical deconditioning   . Pyelonephritis 06/23/2015  . Status post foot surgery    right fifth toe amputation by Dr. Lajoyce Corners     Patient Active Problem List   Diagnosis Date Noted  . Skin ulcer of second toe of left foot (HCC) 07/01/2016  . Edema 05/05/2016  . Phantom pain 03/08/2016  . 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 Gout 03/08/2016  . Hypothyroidism (acquired) 03/08/2016  . CKD (chronic kidney disease) stage 3, GFR 30-59 ml/min 03/08/2016  . GERD without esophagitis 03/08/2016  . Constipation 12/29/2015  . Below knee amputation status (HCC) 10/01/2015  . Ankle pain 08/13/2015  . Adult failure to thrive   . PVD (peripheral vascular disease) (HCC)   . Acute kidney injury superimposed on chronic  kidney disease (HCC) 08/10/2015  . Elevated blood uric acid level 08/10/2015  . 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 Anemia of chronic disease 08/10/2015  . Malnutrition of moderate degree 06/24/2015  . Failure to thrive in adult 06/23/2015  . Weight loss 06/23/2015  . Tobacco abuse 06/23/2015  . COPD (chronic obstructive pulmonary disease) (HCC) 06/23/2015  . Fall   . Critical lower limb ischemia 05/31/2014  . Status post right foot surgery 03/12/2014    Past Surgical History:  Procedure Laterality Date  . AMPUTATION Right 03/01/2014   Procedure: AMPUTATION RAY;  Surgeon: Nadara Mustard, MD;  Location: [facility] OR;  Service: Orthopedics;  Laterality: Right;  Right Foot 5th Ray Amputation  . AMPUTATION Right 04/26/2014   Procedure: Right Foot 4th Ray Amputation;  Surgeon: Nadara Mustard, MD;  Location: [facility] OR;  Service: Orthopedics;  Laterality: Right;  . AMPUTATION Right 10/01/2015   Procedure: Right Below Knee Amputation;  Surgeon: Nadara Mustard, MD;  Location: [facility] OR;  Service: Orthopedics;  Laterality: Right;  . ANKLE ARTHROSCOPY Right 08/13/2015   Procedure: ANKLE ARTHROSCOPY;  Surgeon: Nadara Mustard, MD;  Location: [facility] OR;  Service: Orthopedics;  Laterality: Right;  . CATARACT EXTRACTION W/ INTRAOCULAR LENS  IMPLANT, BILATERAL Bilateral   . INGUINAL HERNIA REPAIR Left   . KNEE CARTILAGE SURGERY Left 1960's  football injury  . KNEE LIGAMENT RECONSTRUCTION Left 1960's  . LOWER EXTREMITY ANGIOGRAM N/A 05/27/2014   Procedure: LOWER EXTREMITY ANGIOGRAM;  Surgeon: Runell Gess, MD;  Location: [facility] CATH LAB;  Service: Cardiovascular;  Laterality: N/A;  . STUMP REVISION Right 12/05/2015   Procedure: Revision Right Below Knee Amputation;  Surgeon: Nadara Mustard, MD;  Location: [facility] OR;  Service: Orthopedics;  Laterality: Right;       Home Medications    Prior to Admission medications   Medication Sig Start Date End Date Taking? Authorizing Provider  albuterol (PROVENTIL HFA;VENTOLIN HFA) 108  (90 BASE) MCG/ACT inhaler Inhale 2 puffs into the lungs 2 (two) times daily. Take every daily per niece    Historical Provider, MD  allopurinol (ZYLOPRIM) 100 MG tablet Take 100 mg by mouth daily.     Historical Provider, MD  cholecalciferol (VITAMIN D) 1000 units tablet Take 1,000 Units by mouth daily. Reported on 11/06/2015    Historical Provider, MD  famotidine (PEPCID) 20 MG tablet Take 20 mg by mouth daily. For GERD.    Historical Provider, MD  levothyroxine (SYNTHROID, LEVOTHROID) 25 MCG tablet Take 12.5 mcg by mouth daily before breakfast.     Historical Provider, MD  loperamide (IMODIUM A-D) 2 MG tablet Give 1 - 2 tablets ( 2mg  - 4mg ) after initial dose following  each loose stool following. Do not give if < 3 stools or Dx c.diff or norovirus. So do not exceed > 16 mg/24 hr period.    Historical Provider, MD  meloxicam (MOBIC) 7.5 MG tablet Take 7.5 mg by mouth daily.     Historical Provider, MD  methocarbamol (ROBAXIN) 500 MG tablet Take 500 mg by mouth every 6 (six) hours as needed for muscle spasms.    Historical Provider, MD  Multiple Vitamins-Minerals (MULTIVITAMIN WITH MINERALS) tablet Take 1 tablet by mouth daily.    Historical Provider, MD  omeprazole (PRILOSEC) 20 MG capsule Take 20 mg by mouth daily.     Historical Provider, MD  oxyCODONE-acetaminophen (PERCOCET) 10-325 MG tablet Take one tablet every 4 hours as needed for moderate pain or severe pain DO NOT EXCEED 4GM OF APAP IN 24 HOURS FROM ALL SOURCES 06/14/16   Tiffany L Reed, DO  saccharomyces boulardii (FLORASTOR) 250 MG capsule Take 250 mg by mouth 2 (two) times daily. 07/12/16 07/22/16  Historical Provider, MD  senna (SENOKOT) 8.6 MG TABS tablet Take 1 tablet (8.6 mg total) by mouth 2 (two) times daily. 08/16/15   Meredeth Ide, MD  traZODone (DESYREL) 50 MG tablet Take 50 mg by mouth at bedtime.     Historical Provider, MD    Family History Family History  Problem Relation Age of Onset  . Heart failure Father   . Heart  attack Father   . Heart failure Brother     Social History Social History  Substance Use Topics  . Smoking status: Former Smoker    Packs/day: 0.25    Years: 57.00    Types: Cigarettes  . Smokeless tobacco: Never Used  . Alcohol use Yes     Comment: 06/23/2015 "was drinking alot of beer; quit in 2015" 09/30/15 - no longer drinks     Allergies   Patient has no known allergies.   Review of Systems Review of Systems  Constitutional: Negative for chills and fever.  Respiratory: Negative for shortness of breath.   Cardiovascular: Negative for chest pain.  Gastrointestinal: Negative for abdominal pain and vomiting.  Skin: Positive for wound (  L foot).  Neurological: Negative for weakness, numbness and headaches.  All other systems reviewed and are negative.    Physical Exam Updated Vital Signs BP 127/92 (BP Location: Right Arm)   Pulse 69   Temp 97.5 F (36.4 C) (Oral)   Resp 18   Ht 6\' 4"  (1.93 m)   Wt 78.9 kg   SpO2 100%   BMI 21.18 kg/m   Physical Exam  Constitutional: He appears well-developed and well-nourished. No distress.  HENT:  Head: Normocephalic and atraumatic.  Mouth/Throat: Oropharynx is clear and moist.  Eyes: Conjunctivae are normal.  Neck: Neck supple.  Cardiovascular: Normal rate and regular rhythm.   No murmur heard. Cap refill 3 seconds L foot and toes, non-palpable PT/DP  Pulmonary/Chest: Effort normal and breath sounds normal. No respiratory distress. He has no wheezes. He has no rales.  Abdominal: Soft. There is no tenderness.  Musculoskeletal: He exhibits no edema.       Feet:  Neurological: He is alert.  Skin: Skin is warm and dry.  Psychiatric: He has a normal mood and affect.  Nursing note and vitals reviewed.    ED Treatments / Results  Labs (all labs ordered are listed, but only abnormal results are displayed) Labs Reviewed  C-REACTIVE PROTEIN - Abnormal; Notable for the following:       Result Value   CRP 1.7 (*)    All  other components within normal limits  SEDIMENTATION RATE - Abnormal; Notable for the following:    Sed Rate 25 (*)    All other components within normal limits  CBC WITH DIFFERENTIAL/PLATELET - Abnormal; Notable for the following:    WBC 17.2 (*)    Hemoglobin 10.6 (*)    HCT 33.8 (*)    MCV 72.7 (*)    MCH 22.8 (*)    RDW 19.0 (*)    Neutro Abs 10.3 (*)    Monocytes Absolute 4.5 (*)    Basophils Absolute 0.7 (*)    All other components within normal limits  BASIC METABOLIC PANEL - Abnormal; Notable for the following:    Potassium 5.3 (*)    BUN 32 (*)    Creatinine, Ser 1.53 (*)    GFR calc non Af Amer 43 (*)    GFR calc Af Amer 50 (*)    All other components within normal limits  URINALYSIS, ROUTINE W REFLEX MICROSCOPIC    EKG  EKG Interpretation None       Radiology Dg Foot 2 Views Left  Result Date: 07/22/2016 CLINICAL DATA:  Open wound on the second toe, evaluate for osteomyelitis EXAM: LEFT FOOT - 2 VIEW COMPARISON:  None. FINDINGS: There is air in the soft tissues of the distal left second toe which could be due to an open wound, but infection cannot be excluded. There does appear to be some loss of the cortical margin of the tuft of the distal phalanx along its lateral aspect and osteomyelitis cannot be excluded. The left second toe is not seen on the lateral view in detail. Tarsal metatarsal alignment is normal. Joint spaces appear normal. No erosion is seen. IMPRESSION: Cannot exclude osteomyelitis involving the distal phalanx of the left second toe. Electronically Signed   By: Dwyane Dee M.D.   On: 07/22/2016 17:02    Procedures Procedures (including critical care time)  Medications Ordered in ED Medications  piperacillin-tazobactam (ZOSYN) IVPB 3.375 g (not administered)  vancomycin (VANCOCIN) 1,500 mg in sodium chloride 0.9 % 500 mL IVPB (not  administered)  HYDROcodone-acetaminophen (NORCO/VICODIN) 5-325 MG per tablet 1 tablet (1 tablet Oral Given 07/22/16  1636)     Initial Impression / Assessment and Plan / ED Course  I have reviewed the triage vital signs and the nursing notes.  Pertinent labs & imaging results that were available during my care of the patient were reviewed by me and considered in my medical decision making (see chart for details).  Clinical Course    Patient is a 76 year old male with history of severe peripheral vascular disease, COPD, COPD, gout who presents with 2 months of worsening left second toe wound. Patient has history of right BKA for severe or peripheral vascular disease as well as osteomyelitis. Patient was seen in wound clinic today was sent here for evaluation.  On exam patient has a open left second toe wound with bone exposed. Wound appears gangrenous nature and is foul-smelling. Patient's leukocytosis 17.2, CRP 1.7 sedimentation rate 25. Foot x-ray shows no obvious erosion but cannot exclude osteomyelitis involving the distal phalanx of the left second toe.  Patient given Vancomycin and Zosyn for wound infection. Norco given for pain. Patient will be admitted to medicine for further workup including MRI of the left foot and orthopedic consult for toe amputation.   1805: I spoke with Dr. Cleophas DunkerWhitfield with [facility] regarding the patient and they recommended admission to hospitalist and that their team will see the patient in the AM.  Pt seen with attending, Dr. Patria Maneampos.  Final Clinical Impressions(s) / ED Diagnoses   Final diagnoses:  Gangrene of toe of left foot [facility](HCC)    New Prescriptions New Prescriptions   No medications on file     Dwana MelenaRobin Saori Umholtz, DO 07/22/16 1746    Dwana Melenaobin Gean Laursen, DO 07/22/16 1810    Azalia BilisKevin Campos, MD 07/23/16 [phone]

## 2016-07-22 NOTE — Progress Notes (Signed)
Pharmacy Antibiotic Note  Caleb Hays is a 76 y.o. male admitted on 07/22/2016 with osteomyelitis.  Pharmacy has been consulted for vancomycin and zosyn dosing. WBC 17.2, Scr 1.53 (baseline ~1.0)  Plan: Vancomycin 750mg  IV every 12 hours.  Goal trough 15-20 mcg/mL. Zosyn 3.375g IV q8h (4 hour infusion). Monitor renal function and adjust dose/frequency as needed  Height: 6\' 4"  (193 cm) Weight: 174 lb (78.9 kg) IBW/kg (Calculated) : 86.8  Temp (24hrs), Avg:97.5 F (36.4 C), Min:97.5 F (36.4 C), Max:97.5 F (36.4 C)   Recent Labs Lab 07/22/16 1626  WBC 17.2*  CREATININE 1.53*    Estimated Creatinine Clearance: 47.3 mL/min (by C-G formula based on SCr of 1.53 mg/dL (H)).    No Known Allergies  Thank you for allowing pharmacy to be a part of this patient's care.  Caleb Hays 07/22/2016 7:31 PM

## 2016-07-23 ENCOUNTER — Encounter (HOSPITAL_COMMUNITY): Payer: Self-pay | Admitting: Anesthesiology

## 2016-07-23 ENCOUNTER — Inpatient Hospital Stay (HOSPITAL_COMMUNITY): Payer: Medicare HMO | Admitting: Certified Registered"

## 2016-07-23 ENCOUNTER — Other Ambulatory Visit (INDEPENDENT_AMBULATORY_CARE_PROVIDER_SITE_OTHER): Payer: Self-pay | Admitting: Family

## 2016-07-23 ENCOUNTER — Other Ambulatory Visit (INDEPENDENT_AMBULATORY_CARE_PROVIDER_SITE_OTHER): Payer: Self-pay | Admitting: Orthopedic Surgery

## 2016-07-23 ENCOUNTER — Encounter (HOSPITAL_COMMUNITY)
Admission: EM | Disposition: A | Discharge status: Home / Self Care | Payer: Self-pay | Source: Home / Self Care | Attending: Internal Medicine

## 2016-07-23 DIAGNOSIS — N390 Urinary tract infection, site not specified: Secondary | ICD-10-CM | POA: Diagnosis present

## 2016-07-23 DIAGNOSIS — M869 Osteomyelitis, unspecified: Secondary | ICD-10-CM | POA: Diagnosis present

## 2016-07-23 DIAGNOSIS — Z9842 Cataract extraction status, left eye: Secondary | ICD-10-CM | POA: Diagnosis not present

## 2016-07-23 DIAGNOSIS — Z79899 Other long term (current) drug therapy: Secondary | ICD-10-CM | POA: Diagnosis not present

## 2016-07-23 DIAGNOSIS — L089 Local infection of the skin and subcutaneous tissue, unspecified: Secondary | ICD-10-CM

## 2016-07-23 DIAGNOSIS — I739 Peripheral vascular disease, unspecified: Secondary | ICD-10-CM

## 2016-07-23 DIAGNOSIS — E039 Hypothyroidism, unspecified: Secondary | ICD-10-CM | POA: Diagnosis present

## 2016-07-23 DIAGNOSIS — Z8249 Family history of ischemic heart disease and other diseases of the circulatory system: Secondary | ICD-10-CM | POA: Diagnosis not present

## 2016-07-23 DIAGNOSIS — M1A9XX1 Chronic gout, unspecified, with tophus (tophi): Secondary | ICD-10-CM | POA: Diagnosis present

## 2016-07-23 DIAGNOSIS — N183 Chronic kidney disease, stage 3 (moderate): Secondary | ICD-10-CM

## 2016-07-23 DIAGNOSIS — I998 Other disorder of circulatory system: Secondary | ICD-10-CM | POA: Diagnosis present

## 2016-07-23 DIAGNOSIS — M86072 Acute hematogenous osteomyelitis, left ankle and foot: Secondary | ICD-10-CM | POA: Diagnosis not present

## 2016-07-23 DIAGNOSIS — E875 Hyperkalemia: Secondary | ICD-10-CM | POA: Diagnosis present

## 2016-07-23 DIAGNOSIS — J449 Chronic obstructive pulmonary disease, unspecified: Secondary | ICD-10-CM | POA: Diagnosis present

## 2016-07-23 DIAGNOSIS — Z961 Presence of intraocular lens: Secondary | ICD-10-CM | POA: Diagnosis present

## 2016-07-23 DIAGNOSIS — Z89511 Acquired absence of right leg below knee: Secondary | ICD-10-CM | POA: Diagnosis not present

## 2016-07-23 DIAGNOSIS — I96 Gangrene, not elsewhere classified: Secondary | ICD-10-CM

## 2016-07-23 DIAGNOSIS — Z9889 Other specified postprocedural states: Secondary | ICD-10-CM | POA: Diagnosis not present

## 2016-07-23 DIAGNOSIS — Z87891 Personal history of nicotine dependence: Secondary | ICD-10-CM | POA: Diagnosis not present

## 2016-07-23 DIAGNOSIS — Z9841 Cataract extraction status, right eye: Secondary | ICD-10-CM | POA: Diagnosis not present

## 2016-07-23 DIAGNOSIS — K219 Gastro-esophageal reflux disease without esophagitis: Secondary | ICD-10-CM | POA: Diagnosis present

## 2016-07-23 HISTORY — PX: AMPUTATION: SHX166

## 2016-07-23 LAB — CBC
HEMATOCRIT: 30 % — AB (ref 39.0–52.0)
Hemoglobin: 9.3 g/dL — ABNORMAL LOW (ref 13.0–17.0)
MCH: 22.5 pg — ABNORMAL LOW (ref 26.0–34.0)
MCHC: 31 g/dL (ref 30.0–36.0)
MCV: 72.6 fL — AB (ref 78.0–100.0)
Platelets: 258 10*3/uL (ref 150–400)
RBC: 4.13 MIL/uL — ABNORMAL LOW (ref 4.22–5.81)
RDW: 18.8 % — AB (ref 11.5–15.5)
WBC: 14.1 10*3/uL — ABNORMAL HIGH (ref 4.0–10.5)

## 2016-07-23 LAB — BASIC METABOLIC PANEL
Anion gap: 5 (ref 5–15)
BUN: 27 mg/dL — AB (ref 6–20)
CHLORIDE: 108 mmol/L (ref 101–111)
CO2: 22 mmol/L (ref 22–32)
Calcium: 8.7 mg/dL — ABNORMAL LOW (ref 8.9–10.3)
Creatinine, Ser: 1.62 mg/dL — ABNORMAL HIGH (ref 0.61–1.24)
GFR calc Af Amer: 47 mL/min — ABNORMAL LOW (ref >60)
GFR calc non Af Amer: 40 mL/min — ABNORMAL LOW (ref >60)
GLUCOSE: 90 mg/dL (ref 65–99)
POTASSIUM: 5 mmol/L (ref 3.5–5.1)
Sodium: 135 mmol/L (ref 135–145)

## 2016-07-23 LAB — URIC ACID: Uric Acid, Serum: 6.6 mg/dL (ref 4.4–7.6)

## 2016-07-23 LAB — PATHOLOGIST SMEAR REVIEW

## 2016-07-23 SURGERY — AMPUTATION DIGIT
Anesthesia: Monitor Anesthesia Care | Site: Toe | Laterality: Left

## 2016-07-23 MED ORDER — FENTANYL CITRATE (PF) 100 MCG/2ML IJ SOLN
INTRAMUSCULAR | Status: AC
  Filled 2016-07-23: qty 2

## 2016-07-23 MED ORDER — FENTANYL CITRATE (PF) 100 MCG/2ML IJ SOLN
100.0000 ug | Freq: Once | INTRAMUSCULAR | Status: AC
  Administered 2016-07-23: 100 ug via INTRAVENOUS
  Filled 2016-07-23: qty 2

## 2016-07-23 MED ORDER — BUPIVACAINE HCL (PF) 0.5 % IJ SOLN
INTRAMUSCULAR | Status: DC | PRN
  Administered 2016-07-23: 10 mL via PERINEURAL

## 2016-07-23 MED ORDER — ONDANSETRON HCL 4 MG/2ML IJ SOLN: 4.0000 mg | Freq: Four times a day (QID) | INTRAMUSCULAR | Status: DC | PRN

## 2016-07-23 MED ORDER — PROPOFOL 500 MG/50ML IV EMUL
INTRAVENOUS | Status: DC | PRN
  Administered 2016-07-23: 75 ug/kg/min via INTRAVENOUS

## 2016-07-23 MED ORDER — METHOCARBAMOL 500 MG PO TABS
500.0000 mg | ORAL_TABLET | Freq: Four times a day (QID) | ORAL | Status: DC | PRN
  Administered 2016-07-24 – 2016-07-27 (×8): 500 mg via ORAL
  Filled 2016-07-23 (×3): qty 1

## 2016-07-23 MED ORDER — 0.9 % SODIUM CHLORIDE (POUR BTL) OPTIME
TOPICAL | Status: DC | PRN
  Administered 2016-07-23: 1000 mL

## 2016-07-23 MED ORDER — ONDANSETRON HCL 4 MG PO TABS: 4.0000 mg | ORAL_TABLET | Freq: Four times a day (QID) | ORAL | Status: DC | PRN

## 2016-07-23 MED ORDER — METOCLOPRAMIDE HCL 5 MG/ML IJ SOLN: 5.0000 mg | Freq: Three times a day (TID) | INTRAMUSCULAR | Status: DC | PRN

## 2016-07-23 MED ORDER — SODIUM CHLORIDE 0.9 % IV SOLN
INTRAVENOUS | Status: DC
  Administered 2016-07-23: 10 mL/h via INTRAVENOUS

## 2016-07-23 MED ORDER — ACETAMINOPHEN 325 MG PO TABS
650.0000 mg | ORAL_TABLET | Freq: Four times a day (QID) | ORAL | Status: DC | PRN
  Administered 2016-07-26 – 2016-07-27 (×3): 650 mg via ORAL
  Filled 2016-07-23 (×3): qty 2

## 2016-07-23 MED ORDER — PROPOFOL 10 MG/ML IV BOLUS
INTRAVENOUS | Status: AC
  Filled 2016-07-23: qty 20

## 2016-07-23 MED ORDER — ROPIVACAINE HCL 7.5 MG/ML IJ SOLN
INTRAMUSCULAR | Status: DC | PRN
  Administered 2016-07-23: 20 mL via PERINEURAL

## 2016-07-23 MED ORDER — METOCLOPRAMIDE HCL 5 MG PO TABS: 5.0000 mg | ORAL_TABLET | Freq: Three times a day (TID) | ORAL | Status: DC | PRN

## 2016-07-23 MED ORDER — SODIUM CHLORIDE 0.9 % IV SOLN: INTRAVENOUS | Status: AC

## 2016-07-23 MED ORDER — ALBUTEROL SULFATE (2.5 MG/3ML) 0.083% IN NEBU: 2.5000 mg | INHALATION_SOLUTION | RESPIRATORY_TRACT | Status: DC | PRN

## 2016-07-23 MED ORDER — METHOCARBAMOL 1000 MG/10ML IJ SOLN
500.0000 mg | Freq: Four times a day (QID) | INTRAVENOUS | Status: DC | PRN
  Filled 2016-07-23: qty 5

## 2016-07-23 MED ORDER — LACTATED RINGERS IV SOLN
INTRAVENOUS | Status: DC | PRN
  Administered 2016-07-23: 13:00:00 via INTRAVENOUS

## 2016-07-23 MED ORDER — FENTANYL CITRATE (PF) 100 MCG/2ML IJ SOLN: 25.0000 ug | INTRAMUSCULAR | Status: DC | PRN

## 2016-07-23 MED ORDER — ACETAMINOPHEN 650 MG RE SUPP: 650.0000 mg | Freq: Four times a day (QID) | RECTAL | Status: DC | PRN

## 2016-07-23 MED ORDER — LIDOCAINE 2% (20 MG/ML) 5 ML SYRINGE
INTRAMUSCULAR | Status: AC
  Filled 2016-07-23: qty 5

## 2016-07-23 MED ORDER — PIPERACILLIN-TAZOBACTAM 3.375 G IVPB 30 MIN
3.3750 g | Freq: Once | INTRAVENOUS | Status: DC
  Filled 2016-07-23: qty 50

## 2016-07-23 MED ORDER — COLCHICINE 0.6 MG PO TABS
0.6000 mg | ORAL_TABLET | Freq: Every day | ORAL | Status: DC
  Administered 2016-07-23 – 2016-07-28 (×6): 0.6 mg via ORAL
  Filled 2016-07-23 (×6): qty 1

## 2016-07-23 MED ORDER — MIDAZOLAM HCL 2 MG/2ML IJ SOLN
INTRAMUSCULAR | Status: AC
  Filled 2016-07-23: qty 2

## 2016-07-23 MED ORDER — MIDAZOLAM HCL 5 MG/5ML IJ SOLN
INTRAMUSCULAR | Status: DC | PRN
  Administered 2016-07-23: 2 mg via INTRAVENOUS

## 2016-07-23 MED ORDER — LACTATED RINGERS IV SOLN
INTRAVENOUS | Status: DC
  Administered 2016-07-23: 14:00:00 via INTRAVENOUS

## 2016-07-23 SURGICAL SUPPLY — 31 items
BLADE SURG 21 STRL SS (BLADE) ×3 IMPLANT
BNDG COHESIVE 4X5 TAN STRL (GAUZE/BANDAGES/DRESSINGS) ×3 IMPLANT
BNDG ESMARK 4X9 LF (GAUZE/BANDAGES/DRESSINGS) IMPLANT
BNDG GAUZE ELAST 4 BULKY (GAUZE/BANDAGES/DRESSINGS) ×3 IMPLANT
COVER SURGICAL LIGHT HANDLE (MISCELLANEOUS) ×6 IMPLANT
DRAPE U-SHAPE 47X51 STRL (DRAPES) ×3 IMPLANT
DRSG ADAPTIC 3X8 NADH LF (GAUZE/BANDAGES/DRESSINGS) ×3 IMPLANT
DRSG PAD ABDOMINAL 8X10 ST (GAUZE/BANDAGES/DRESSINGS) ×3 IMPLANT
DURAPREP 26ML APPLICATOR (WOUND CARE) ×3 IMPLANT
ELECT REM PT RETURN 9FT ADLT (ELECTROSURGICAL) ×3
ELECTRODE REM PT RTRN 9FT ADLT (ELECTROSURGICAL) ×1 IMPLANT
GAUZE SPONGE 4X4 12PLY STRL (GAUZE/BANDAGES/DRESSINGS) IMPLANT
GLOVE BIOGEL PI IND STRL 9 (GLOVE) ×1 IMPLANT
GLOVE BIOGEL PI INDICATOR 9 (GLOVE) ×2
GLOVE SURG ORTHO 9.0 STRL STRW (GLOVE) ×3 IMPLANT
GOWN STRL REUS W/ TWL XL LVL3 (GOWN DISPOSABLE) ×2 IMPLANT
GOWN STRL REUS W/TWL XL LVL3 (GOWN DISPOSABLE) ×4
KIT BASIN OR (CUSTOM PROCEDURE TRAY) ×3 IMPLANT
KIT ROOM TURNOVER OR (KITS) ×3 IMPLANT
MANIFOLD NEPTUNE II (INSTRUMENTS) ×3 IMPLANT
NEEDLE 22X1 1/2 (OR ONLY) (NEEDLE) IMPLANT
NS IRRIG 1000ML POUR BTL (IV SOLUTION) ×3 IMPLANT
PACK ORTHO EXTREMITY (CUSTOM PROCEDURE TRAY) ×3 IMPLANT
PAD ARMBOARD 7.5X6 YLW CONV (MISCELLANEOUS) ×6 IMPLANT
SPONGE GAUZE 4X4 12PLY STER LF (GAUZE/BANDAGES/DRESSINGS) ×3 IMPLANT
SUCTION FRAZIER HANDLE 10FR (MISCELLANEOUS)
SUCTION TUBE FRAZIER 10FR DISP (MISCELLANEOUS) IMPLANT
SUT ETHILON 2 0 PSLX (SUTURE) ×3 IMPLANT
SYR CONTROL 10ML LL (SYRINGE) IMPLANT
TOWEL OR 17X24 6PK STRL BLUE (TOWEL DISPOSABLE) ×3 IMPLANT
TOWEL OR 17X26 10 PK STRL BLUE (TOWEL DISPOSABLE) ×3 IMPLANT

## 2016-07-23 NOTE — Progress Notes (Signed)
PROGRESS NOTE    Caleb Hays  ZOX:[id number] DOB: 1940-11-24 DOA: 07/22/2016 PCP: [name], [name], MD   Brief Narrative:  Patient admitted for nonhealing left toe gangrene from his wound care office. Ortho to take him for amputation today.    Assessment & Plan:   Active Problems:   COPD (chronic obstructive pulmonary disease) (HCC)   Hypothyroidism (acquired)   GERD without esophagitis   Toe infection   Osteomyelitis (HCC)   Gangrene of toe of left foot (HCC)  Assessment/Plan   Osteomyelitis (HCC)/ Toe infection - on IV antibiotics vanc and zosyn Day 2 - Ortho consulted and evaluated this morning.  - will need post op PT/OT - I have discussed the case with Dr Allyson SabalBerry who will follow up with him outpatient to discussed the need for angio and revascularization options. Appreciate his input. -check A1c and lipid panel     UTI -cont Abx for now -urin cultures ordered      COPD (chronic obstructive pulmonary disease) (HCC) - stable, continue home medication regimen    Hypothyroidism (acquired) - continue synthroid, stable no hypothyroidism symptoms reported    GERD without esophagitis  - stable continue PPI  Hyperkalemia - slightly improved, will monitor - reassess next am  CKD stage III -gentle hydration   PVD -hx of critical limb ischemia and severe PVD -outpatient follow up with Dr Allyson SabalBerry, I have discussed the case with him.   DVT prophylaxis: heparin Code Status: full Family Communication: none at bedside Disposition Plan: med suirg Consults called: Ortho Whitfield ([facility]) Admission status: obs    DVT prophylaxis:  Code Status:  Family Communication:   Disposition Plan:   Consultants:   Ortho   Procedures:   Toe amputation today   Antimicrobials:   Currently on vanc and zosyn    Subjective: No new complaints this morning. He remains afebrile. Patient requested me to discuss his case with Dr Allyson SabalBerry  Objective: Vitals:   07/22/16 1815 07/22/16 1838 07/22/16 2113 07/23/16 0440  BP: 173/71 137/76 (!) 103/59 (!) 141/55  Pulse: 72 80 75 (!) 53  Resp:  16 16 16   Temp:   98 F (36.7 C)   TempSrc:   Oral   SpO2: 99% 100% 100% 97%  Weight:      Height:        Intake/Output Summary (Last 24 hours) at 07/23/16 1015 Last data filed at 07/23/16 0900  Gross per 24 hour  Intake                0 ml  Output              700 ml  Net             -700 ml   Filed Weights   07/22/16 1540  Weight: 78.9 kg (174 lb)    Examination:  General exam: Appears calm and comfortable  Respiratory system: Clear to auscultation. Respiratory effort normal. Cardiovascular system: S1 & S2 heard, RRR. No JVD, murmurs, rubs, gallops or clicks. No pedal edema. Gastrointestinal system: Abdomen is nondistended, soft and nontender. No organomegaly or masses felt. Normal bowel sounds heard. Central nervous system: Alert and oriented. No focal neurological deficits. Extremities: RLE amputated below knee, Left second toe necrotic/gangrenous toe noted.  Skin: No rashes, lesions or ulcers Psychiatry: Judgement and insight appear normal. Mood & affect appropriate.     Data Reviewed:   CBC:  Recent Labs Lab 07/22/16 1626 07/23/16 0826  WBC 17.2* 14.1*  NEUTROABS 10.3*  --   HGB 10.6* 9.3*  HCT 33.8* 30.0*  MCV 72.7* 72.6*  PLT 293 258   Basic Metabolic Panel:  Recent Labs Lab 07/22/16 1626 07/23/16 0826  NA 137 135  K 5.3* 5.0  CL 106 108  CO2 22 22  GLUCOSE 89 90  BUN 32* 27*  CREATININE 1.53* 1.62*  CALCIUM 9.2 8.7*   GFR: Estimated Creatinine Clearance: 44.6 mL/min (by C-G formula based on SCr of 1.62 mg/dL (H)). Liver Function Tests: No results for input(s): AST, ALT, ALKPHOS, BILITOT, PROT, ALBUMIN in the last 168 hours. No results for input(s): LIPASE, AMYLASE in the last 168 hours. No results for input(s): AMMONIA in the last 168 hours. Coagulation Profile: No results for input(s): INR, PROTIME in the  last 168 hours. Cardiac Enzymes: No results for input(s): CKTOTAL, CKMB, CKMBINDEX, TROPONINI in the last 168 hours. BNP (last 3 results) No results for input(s): PROBNP in the last 8760 hours. HbA1C: No results for input(s): HGBA1C in the last 72 hours. CBG: No results for input(s): GLUCAP in the last 168 hours. Lipid Profile: No results for input(s): CHOL, HDL, LDLCALC, TRIG, CHOLHDL, LDLDIRECT in the last 72 hours. Thyroid Function Tests: No results for input(s): TSH, T4TOTAL, FREET4, T3FREE, THYROIDAB in the last 72 hours. Anemia Panel: No results for input(s): VITAMINB12, FOLATE, FERRITIN, TIBC, IRON, RETICCTPCT in the last 72 hours. Sepsis Labs: No results for input(s): PROCALCITON, LATICACIDVEN in the last 168 hours.  No results found for this or any previous visit (from the past 240 hour(s)).       Radiology Studies: Dg Foot 2 Views Left  Result Date: 07/22/2016 CLINICAL DATA:  Open wound on the second toe, evaluate for osteomyelitis EXAM: LEFT FOOT - 2 VIEW COMPARISON:  None. FINDINGS: There is air in the soft tissues of the distal left second toe which could be due to an open wound, but infection cannot be excluded. There does appear to be some loss of the cortical margin of the tuft of the distal phalanx along its lateral aspect and osteomyelitis cannot be excluded. The left second toe is not seen on the lateral view in detail. Tarsal metatarsal alignment is normal. Joint spaces appear normal. No erosion is seen. IMPRESSION: Cannot exclude osteomyelitis involving the distal phalanx of the left second toe. Electronically Signed   By: Dwyane Dee M.D.   On: 07/22/2016 17:02        Scheduled Meds: . cholecalciferol  1,000 Units Oral Daily  . heparin  5,000 Units Subcutaneous Q8H  . levothyroxine  12.5 mcg Oral QAC breakfast  . multivitamin with minerals  1 tablet Oral Daily  . pantoprazole  40 mg Oral Daily  . piperacillin-tazobactam (ZOSYN)  IV  3.375 g Intravenous Q8H   . senna  1 tablet Oral BID  . sodium chloride flush  3 mL Intravenous Q12H  . sodium polystyrene  15 g Oral Once  . traZODone  50 mg Oral QHS  . vancomycin  750 mg Intravenous Q12H   Continuous Infusions:   LOS: 1 day    Time spent: 35 mins     Ankit Joline Maxcy, MD [facility] Pager [phone]   If 7PM-7AM, please contact night-coverage [email or web address] Password [email or web address] 07/23/2016, 10:15 AM

## 2016-07-23 NOTE — Progress Notes (Signed)
Patient is status post amputation left foot second toe MTP joint. There was significant tophaceous gout at the MTP joint. Orders were written for uric acid level and to start colchicine 0.6 mg daily.

## 2016-07-23 NOTE — Consult Note (Signed)
ORTHOPAEDIC CONSULTATION  REQUESTING PHYSICIAN: Ankit Joline Maxcy, MD  Chief Complaint: Osteomyelitis ulceration left foot second toe with changes to the tip of the great toe.  HPI: Caleb Hays is a 76 y.o. male who presents with osteomyelitis ulceration and necrotic changes to the second toe left foot with mild ischemic changes of the great toe. Patient has been going to the wound center and was referred to the hospital for admission due to  necrotic changes to the second toe. Patient has worked with Dr. Allyson Sabal and will be evaluated for revascularization options.  Past Medical History:  Diagnosis Date  . Adult failure to thrive    /notes 06/23/2015  . Anemia   . Arthritis    "hands" (06/23/2015)  . COPD (chronic obstructive pulmonary disease) (HCC)    LONG TIME SMOKER  . Critical lower limb ischemia   . 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 Depression    "periods of depression" (06/23/2015)  . Gout   . Physical deconditioning   . Pyelonephritis 06/23/2015  . Status post foot surgery    right fifth toe amputation by Dr. Lajoyce Corners    Past Surgical History:  Procedure Laterality Date  . AMPUTATION Right 03/01/2014   Procedure: AMPUTATION RAY;  Surgeon: Nadara Mustard, MD;  Location: [facility] OR;  Service: Orthopedics;  Laterality: Right;  Right Foot 5th Ray Amputation  . AMPUTATION Right 04/26/2014   Procedure: Right Foot 4th Ray Amputation;  Surgeon: Nadara Mustard, MD;  Location: [facility] OR;  Service: Orthopedics;  Laterality: Right;  . AMPUTATION Right 10/01/2015   Procedure: Right Below Knee Amputation;  Surgeon: Nadara Mustard, MD;  Location: [facility] OR;  Service: Orthopedics;  Laterality: Right;  . ANKLE ARTHROSCOPY Right 08/13/2015   Procedure: ANKLE ARTHROSCOPY;  Surgeon: Nadara Mustard, MD;  Location: [facility] OR;  Service: Orthopedics;  Laterality: Right;  . CATARACT EXTRACTION W/ INTRAOCULAR LENS  IMPLANT, BILATERAL Bilateral   . INGUINAL HERNIA REPAIR Left   .  KNEE CARTILAGE SURGERY Left 1960's   football injury  . KNEE LIGAMENT RECONSTRUCTION Left 1960's  . LOWER EXTREMITY ANGIOGRAM N/A 05/27/2014   Procedure: LOWER EXTREMITY ANGIOGRAM;  Surgeon: Runell Gess, MD;  Location: [facility] CATH LAB;  Service: Cardiovascular;  Laterality: N/A;  . STUMP REVISION Right 12/05/2015   Procedure: Revision Right Below Knee Amputation;  Surgeon: Nadara Mustard, MD;  Location: [facility] OR;  Service: Orthopedics;  Laterality: Right;   Social History   Social History  . Marital status: Single    Spouse name: N/A  . Number of children: N/A  . Years of education: N/A   Social History Main Topics  . Smoking status: Former Smoker    Packs/day: 0.25    Years: 57.00    Types: Cigarettes  . Smokeless tobacco: Never Used  . Alcohol use Yes     Comment: 06/23/2015 "was drinking alot of beer; quit in 2015" 09/30/15 - no longer drinks  . Drug use: No  . Sexual activity: No   Other Topics Concern  . None   Social History Narrative  . None   Family History  Problem Relation Age of Onset  . Heart failure Father   . Heart attack Father   . Heart failure Brother    - negative except otherwise stated in the family history section No Known Allergies Prior to Admission medications   Medication Sig Start Date End Date Taking? Authorizing Provider  allopurinol (ZYLOPRIM) 100 MG tablet Take 100 mg by mouth daily.    Yes Historical Provider, MD  cholecalciferol (VITAMIN D) 1000 units tablet Take 1,000 Units by mouth daily. Reported on 11/06/2015   Yes Historical Provider, MD  levothyroxine (SYNTHROID, LEVOTHROID) 25 MCG tablet Take 12.5 mcg by mouth daily before breakfast.    Yes Historical Provider, MD  loperamide (IMODIUM A-D) 2 MG tablet Give 1 - 2 tablets ( 2mg  - 4mg ) after initial dose following  each loose stool following. Do not give if < 3 stools or Dx c.diff or norovirus. So do not exceed > 16 mg/24 hr period.   Yes Historical Provider, MD  meloxicam (MOBIC) 7.5 MG  tablet Take 7.5 mg by mouth daily.    Yes Historical Provider, MD  methocarbamol (ROBAXIN) 500 MG tablet Take 500 mg by mouth every 6 (six) hours as needed for muscle spasms.   Yes Historical Provider, MD  Multiple Vitamins-Minerals (MULTIVITAMIN WITH MINERALS) tablet Take 1 tablet by mouth daily.   Yes Historical Provider, MD  omeprazole (PRILOSEC) 20 MG capsule Take 20 mg by mouth daily.    Yes Historical Provider, MD  oxyCODONE-acetaminophen (PERCOCET) 10-325 MG tablet Take one tablet every 4 hours as needed for moderate pain or severe pain DO NOT EXCEED 4GM OF APAP IN 24 HOURS FROM ALL SOURCES 06/14/16  Yes Tiffany L Reed, DO  albuterol (PROVENTIL HFA;VENTOLIN HFA) 108 (90 BASE) MCG/ACT inhaler Inhale 2 puffs into the lungs 2 (two) times daily. Take every daily per niece    Historical Provider, MD  famotidine (PEPCID) 20 MG tablet Take 20 mg by mouth daily. For GERD.    Historical Provider, MD  senna (SENOKOT) 8.6 MG TABS tablet Take 1 tablet (8.6 mg total) by mouth 2 (two) times daily. 08/16/15   Meredeth IdeGagan S Lama, MD  traZODone (DESYREL) 50 MG tablet Take 50 mg by mouth at bedtime.     Historical Provider, MD   Dg Foot 2 Views Left  Result Date: 07/22/2016 CLINICAL DATA:  Open wound on the second toe, evaluate for osteomyelitis EXAM: LEFT FOOT - 2 VIEW COMPARISON:  None. FINDINGS: There is air in the soft tissues of the distal left second toe which could be due to an open wound, but infection cannot be excluded. There does appear to be some loss of the cortical margin of the tuft of the distal phalanx along its lateral aspect and osteomyelitis cannot be excluded. The left second toe is not seen on the lateral view in detail. Tarsal metatarsal alignment is normal. Joint spaces appear normal. No erosion is seen. IMPRESSION: Cannot exclude osteomyelitis involving the distal phalanx of the left second toe. Electronically Signed   By: Dwyane DeePaul  Barry M.D.   On: 07/22/2016 17:02   - pertinent xrays, CT, MRI  studies were reviewed and independently interpreted  Positive ROS: All other systems have been reviewed and were otherwise negative with the exception of those mentioned in the HPI and as above.  Physical Exam: General: Alert, no acute distress Psychiatric: Patient is competent for consent with normal mood and affect Lymphatic: No axillary or cervical lymphadenopathy Cardiovascular: No pedal edema Respiratory: No cyanosis, no use of accessory musculature GI: No organomegaly, abdomen is soft and non-tender  Skin: Patient has wet gangrenous changes to the second toe left foot. There is no ascending cellulitis there is some mild ischemic changes to the great toe.   Neurologic: Patient does not have protective sensation bilateral lower extremities.   MUSCULOSKELETAL:  On examination patient has a stable 11 amputation on the right. Left foot does not have palpable pulses. His foot is warm. There is wet gangrene of the second toe radiograph shows osteomyelitis of the second toe.  Assessment: Assessment: Peripheral vascular disease with wet gangrenous changes to the second toe left foot and mild ischemic changes of the great toe left foot.  Plan: Plan: We will plan for second toe amputation today. Patient to be evaluated by Dr. Allyson Sabal for possible revascularization to the left lower extremity  Thank you for the consult and the opportunity to see Mr. Ralphs  Aldean Baker, MD [facility] [phone] 7:04 AM

## 2016-07-23 NOTE — Op Note (Signed)
07/22/2016 - 07/23/2016  2:36 PM  PATIENT:  Barton DuboisJerald J Kirst    PRE-OPERATIVE DIAGNOSIS:  Osteomyelitis Left 2nd Toe  POST-OPERATIVE DIAGNOSIS:  Same  PROCEDURE:  Left 2nd Toe Amputation  SURGEON:  Nadara MustardMarcus V Duda, MD  PHYSICIAN ASSISTANT:None ANESTHESIA:   General  PREOPERATIVE INDICATIONS:  Ashley MarinerJerald J Biskup is a  76 y.o. male with a diagnosis of Osteomyelitis Left 2nd Toe who failed conservative measures and elected for surgical management.    The risks benefits and alternatives were discussed with the patient preoperatively including but not limited to the risks of infection, bleeding, nerve injury, cardiopulmonary complications, the need for revision surgery, among others, and the patient was willing to proceed.  OPERATIVE IMPLANTS: None  OPERATIVE FINDINGS: Tophaceous gout at the MTP joint no abscess minimal petechial bleeding  OPERATIVE PROCEDURE: Patient was brought to the operating room after undergoing a popliteal block on the left. After adequate levels anesthesia obtained patient's left lower extremity was prepped using DuraPrep draped into a sterile field a timeout was called. A Chevron incision was made just distal to the MTP joint. The toe was amputated through the MTP joint. There was tophaceous gout and this was removed with a Ronjair there is no abscess no signs of any deep infection. There was minimal petechial bleeding. The incision was closed using 2-0 nylon. A sterile compressive dressing was applied. Patient was taken the PACU in stable condition.  Anticipate cardiology evaluation for vascular status.

## 2016-07-23 NOTE — Transfer of Care (Signed)
Immediate Anesthesia Transfer of Care Note  Patient: Caleb Hays  Procedure(s) Performed: Procedure(s): Left 2nd Toe Amputation (Left)  Patient Location: PACU  Anesthesia Type:MAC and Regional  Level of Consciousness: awake, oriented and patient cooperative  Airway & Oxygen Therapy: Patient Spontanous Breathing and Patient connected to face mask oxygen  Post-op Assessment: Report given to RN and Post -op Vital signs reviewed and stable  Post vital signs: Reviewed  Last Vitals:  Vitals:   07/23/16 0440 07/23/16 1450  BP: (!) 141/55   Pulse: (!) 53   Resp: 16   Temp:  36.6 C    Last Pain:  Vitals:   07/23/16 0815  TempSrc:   PainSc: 0-No pain         Complications: No apparent anesthesia complications

## 2016-07-23 NOTE — Anesthesia Procedure Notes (Signed)
Anesthesia Regional Block:  Popliteal block  Pre-Anesthetic Checklist: ,, timeout performed, Correct Patient, Correct Site, Correct Laterality, Correct Procedure, Correct Position, site marked, Risks and benefits discussed,  Surgical consent,  Pre-op evaluation,  At surgeon's request and post-op pain management  Laterality: Lower and Left  Prep: chloraprep       Needles:  Injection technique: Single-shot  Needle Type: Echogenic Needle          Additional Needles:  Procedures: ultrasound guided (picture in chart) Popliteal block Narrative:  Start time: 07/23/2016 1:51 PM End time: 07/23/2016 1:57 PM Injection made incrementally with aspirations every 5 mL.  Performed by: Personally   Additional Notes: H+P and labs reviewed, risks and benefits discussed with patient, procedure tolerated well without complications

## 2016-07-23 NOTE — Anesthesia Preprocedure Evaluation (Addendum)
Anesthesia Evaluation  Patient identified by MRN, date of birth, ID band Patient awake    Reviewed: Allergy & Precautions, NPO status , Patient's Chart, lab work & pertinent test results  History of Anesthesia Complications Negative for: history of anesthetic complications  Airway Mallampati: II  TM Distance: >3 FB Neck ROM: Full    Dental no notable dental hx. (+) Missing, Poor Dentition, Dental Advisory Given   Pulmonary neg pulmonary ROS, COPD,  COPD inhaler, Current Smoker, former smoker,    Pulmonary exam normal breath sounds clear to auscultation       Cardiovascular + Peripheral Vascular Disease  Normal cardiovascular exam Rhythm:Regular Rate:Normal     Neuro/Psych PSYCHIATRIC DISORDERS Depression negative neurological ROS     GI/Hepatic Neg liver ROS, GERD  Medicated and Controlled,  Endo/Other  Hypothyroidism   Renal/GU Renal disease  negative genitourinary   Musculoskeletal negative musculoskeletal ROS (+)   Abdominal   Peds negative pediatric ROS (+)  Hematology negative hematology ROS (+) anemia ,   Anesthesia Other Findings   Reproductive/Obstetrics negative OB ROS                            Anesthesia Physical Anesthesia Plan  ASA: III  Anesthesia Plan: Regional and MAC   Post-op Pain Management:    Induction: Intravenous  Airway Management Planned: Natural Airway, Nasal Cannula and Simple Face Mask  Additional Equipment: None  Intra-op Plan:   Post-operative Plan:   Informed Consent: I have reviewed the patients History and Physical, chart, labs and discussed the procedure including the risks, benefits and alternatives for the proposed anesthesia with the patient or authorized representative who has indicated his/her understanding and acceptance.   Dental advisory given  Plan Discussed with: Surgeon and CRNA  Anesthesia Plan Comments:          Anesthesia Quick Evaluation

## 2016-07-24 LAB — BASIC METABOLIC PANEL
ANION GAP: 7 (ref 5–15)
BUN: 24 mg/dL — ABNORMAL HIGH (ref 6–20)
CALCIUM: 8.9 mg/dL (ref 8.9–10.3)
CO2: 22 mmol/L (ref 22–32)
CREATININE: 1.52 mg/dL — AB (ref 0.61–1.24)
Chloride: 105 mmol/L (ref 101–111)
GFR, EST AFRICAN AMERICAN: 50 mL/min — AB (ref >60)
GFR, EST NON AFRICAN AMERICAN: 43 mL/min — AB (ref >60)
Glucose, Bld: 90 mg/dL (ref 65–99)
Potassium: 5.1 mmol/L (ref 3.5–5.1)
Sodium: 134 mmol/L — ABNORMAL LOW (ref 135–145)

## 2016-07-24 LAB — CBC
HEMATOCRIT: 31.2 % — AB (ref 39.0–52.0)
Hemoglobin: 9.8 g/dL — ABNORMAL LOW (ref 13.0–17.0)
MCH: 22.8 pg — ABNORMAL LOW (ref 26.0–34.0)
MCHC: 31.4 g/dL (ref 30.0–36.0)
MCV: 72.7 fL — ABNORMAL LOW (ref 78.0–100.0)
PLATELETS: 261 10*3/uL (ref 150–400)
RBC: 4.29 MIL/uL (ref 4.22–5.81)
RDW: 18.9 % — AB (ref 11.5–15.5)
WBC: 13.8 10*3/uL — AB (ref 4.0–10.5)

## 2016-07-24 LAB — HEMOGLOBIN A1C
HEMOGLOBIN A1C: 5.4 % (ref 4.8–5.6)
Mean Plasma Glucose: 108 mg/dL

## 2016-07-24 MED ORDER — LEVOFLOXACIN 500 MG PO TABS
500.0000 mg | ORAL_TABLET | Freq: Every day | ORAL | Status: DC
  Administered 2016-07-24 – 2016-07-25 (×2): 500 mg via ORAL
  Filled 2016-07-24 (×2): qty 1

## 2016-07-24 NOTE — Progress Notes (Signed)
Patient ID: Caleb MarinerJerald J Cantave, male   DOB: 05-08-1941, 76 y.o.   MRN: [id number] Postoperative day 1 status post amputation second toe left foot. Patient was placed on colchicine 0.6 mg a day his uric acid was 6.6. He had significant tophaceous deposits within the joint. Anticipate follow-up with Dr. Allyson SabalBerry outpatient. Patient may discharge to skilled nursing at this time. Minimize weightbearing left foot.

## 2016-07-24 NOTE — Progress Notes (Signed)
PT Cancellation Note  Patient Details Name: Caleb Hays MRN: [id number] DOB: 08-07-1940   Cancelled Treatment:    Reason Eval/Treat Not Completed: Patient declined.  Attempted to evaluate x2 trials, patient reports pain and unable to participate with therapy.  Additionally, patient is Touch Down Weight Bearing on Left leg, and has below knee amputation on Right leg with prosthetic at nursing home (not at hospital).  Patient reports he is going back to the nursing home soon anyway.   [name], [name] 07/24/2016, 2:47 PM

## 2016-07-24 NOTE — Progress Notes (Signed)
PROGRESS NOTE    Barton DuboisJerald J Medico  ZOX:[id number] DOB: [birth year] DOA: 07/22/2016 PCP: [name], [name], MD   Brief Narrative:  Patient admitted for nonhealing left toe gangrene from his wound care office. S/p Amputation, lots of tophus noted during the surgery. Patient started on Colcihine. He is doing well postop.   Assessment & Plan:   Active Problems:   COPD (chronic obstructive pulmonary disease) (HCC)   PVD (peripheral vascular disease) (HCC)   Hypothyroidism (acquired)   CKD (chronic kidney disease) stage 3, GFR 30-59 ml/min   GERD without esophagitis   Toe infection   Osteomyelitis (HCC)   Gangrene of toe of left foot (HCC)  Assessment/Plan   Osteomyelitis (HCC)/ Toe infection: POD #1 Left 2nd Toe Amputation - stop Abx, s/p Amputation - case discussed with Dr Lajoyce Cornersuda this morning, he is ok to be discharged to SNF with one or two more days of colchicine otherwise no Abx needed.  - I have discussed the case with Dr Allyson SabalBerry who will follow up with him outpatient to discussed the need for angio and revascularization options. Appreciate his input.  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   UTI -will prescribe levaquin for UTI      COPD (chronic obstructive pulmonary disease) (HCC) - stable, continue home medication regimen    Hypothyroidism (acquired) - continue synthroid, stable no hypothyroidism symptoms reported    GERD without esophagitis  - stable continue PPI  Hyperkalemia - slightly improved, will monitor - reassess next am  CKD stage III -gentle hydration   PVD -hx of critical limb ischemia and severe PVD -outpatient follow up with Dr Allyson SabalBerry, I have discussed the case with him.   DVT prophylaxis: heparin Code Status: full Family Communication: none at bedside Disposition Plan: med suirg Consults called: Ortho [facility] ([facility]) Admission status: obs    Consultants:   Ortho   Procedures:    POD#1Left 2nd Toe Amputation  Antimicrobials:   Currently on vanc and zosyn    Subjective: No new complaints this morning. He remains afebrile.  Objective: Vitals:   07/23/16 1510 07/23/16 2043 07/24/16 0127 07/24/16 0456  BP:  (!) 151/67 (!) 142/61 139/60  Pulse: (!) 58 64 (!) 59 (!) 56  Resp: 17 17 18 17   Temp: 97.9 F (36.6 C) 97.5 F (36.4 C) 97.8 F (36.6 C) 97.4 F (36.3 C)  TempSrc:  Oral Oral Axillary  SpO2: 98% 98% 98% 100%  Weight:      Height:        Intake/Output Summary (Last 24 hours) at 07/24/16 1137 Last data filed at 07/24/16 0603  Gross per 24 hour  Intake              600 ml  Output              955 ml  Net             -355 ml   Filed Weights   07/22/16 1540  Weight: 78.9 kg (174 lb)    Examination:  General exam: Appears calm and comfortable  Respiratory system: Clear to auscultation. Respiratory effort normal. Cardiovascular system: S1 & S2 heard, RRR. No JVD, murmurs, rubs, gallops or clicks. No pedal edema. Gastrointestinal system: Abdomen is nondistended, soft and nontender. No organomegaly or masses felt. Normal bowel sounds heard. Central nervous system: Alert and oriented. No focal neurological deficits. Extremities: RLE amputated below knee, left foot dressing in place  Skin:  No rashes, lesions or ulcers Psychiatry: Judgement and insight appear normal. Mood & affect appropriate.     Data Reviewed:   CBC:  Recent Labs Lab 07/22/16 1626 07/23/16 0826 07/24/16 0636  WBC 17.2* 14.1* 13.8*  NEUTROABS 10.3*  --   --   HGB 10.6* 9.3* 9.8*  HCT 33.8* 30.0* 31.2*  MCV 72.7* 72.6* 72.7*  PLT 293 258 261   Basic Metabolic Panel:  Recent Labs Lab 07/22/16 1626 07/23/16 0826 07/24/16 0636  NA 137 135 134*  K 5.3* 5.0 5.1  CL 106 108 105  CO2 22 22 22   GLUCOSE 89 90 90  BUN 32* 27* 24*  CREATININE 1.53* 1.62* 1.52*  CALCIUM 9.2 8.7* 8.9   GFR: Estimated Creatinine Clearance: 47.6 mL/min (by C-G formula based on SCr  of 1.52 mg/dL (H)). Liver Function Tests: No results for input(s): AST, ALT, ALKPHOS, BILITOT, PROT, ALBUMIN in the last 168 hours. No results for input(s): LIPASE, AMYLASE in the last 168 hours. No results for input(s): AMMONIA in the last 168 hours. Coagulation Profile: No results for input(s): INR, PROTIME in the last 168 hours. Cardiac Enzymes: No results for input(s): CKTOTAL, CKMB, CKMBINDEX, TROPONINI in the last 168 hours. BNP (last 3 results) No results for input(s): PROBNP in the last 8760 hours. HbA1C:  Recent Labs  07/23/16 0826  HGBA1C 5.4   CBG: No results for input(s): GLUCAP in the last 168 hours. Lipid Profile: No results for input(s): CHOL, HDL, LDLCALC, TRIG, CHOLHDL, LDLDIRECT in the last 72 hours. Thyroid Function Tests: No results for input(s): TSH, T4TOTAL, FREET4, T3FREE, THYROIDAB in the last 72 hours. Anemia Panel: No results for input(s): VITAMINB12, FOLATE, FERRITIN, TIBC, IRON, RETICCTPCT in the last 72 hours. Sepsis Labs: No results for input(s): PROCALCITON, LATICACIDVEN in the last 168 hours.  No results found for this or any previous visit (from the past 240 hour(s)).       Radiology Studies: Dg Foot 2 Views Left  Result Date: 07/22/2016 CLINICAL DATA:  Open wound on the second toe, evaluate for osteomyelitis EXAM: LEFT FOOT - 2 VIEW COMPARISON:  None. FINDINGS: There is air in the soft tissues of the distal left second toe which could be due to an open wound, but infection cannot be excluded. There does appear to be some loss of the cortical margin of the tuft of the distal phalanx along its lateral aspect and osteomyelitis cannot be excluded. The left second toe is not seen on the lateral view in detail. Tarsal metatarsal alignment is normal. Joint spaces appear normal. No erosion is seen. IMPRESSION: Cannot exclude osteomyelitis involving the distal phalanx of the left second toe. Electronically Signed   By: Dwyane Dee M.D.   On: 07/22/2016  17:02        Scheduled Meds: . cholecalciferol  1,000 Units Oral Daily  . colchicine  0.6 mg Oral Daily  . heparin  5,000 Units Subcutaneous Q8H  . levothyroxine  12.5 mcg Oral QAC breakfast  . multivitamin with minerals  1 tablet Oral Daily  . pantoprazole  40 mg Oral Daily  . piperacillin-tazobactam (ZOSYN)  IV  3.375 g Intravenous Q8H  . senna  1 tablet Oral BID  . sodium chloride flush  3 mL Intravenous Q12H  . sodium polystyrene  15 g Oral Once  . traZODone  50 mg Oral QHS  . vancomycin  750 mg Intravenous Q12H   Continuous Infusions: . sodium chloride 10 mL/hr (07/23/16 1515)  . lactated ringers 10  mL/hr at 07/23/16 1351     LOS: 2 days    Time spent: 35 mins     Vale Peraza Joline Maxcy, MD [facility] Pager [phone]   If 7PM-7AM, please contact night-coverage [email or web address] Password [facility] 07/24/2016, 11:37 AM

## 2016-07-25 DIAGNOSIS — K219 Gastro-esophageal reflux disease without esophagitis: Secondary | ICD-10-CM

## 2016-07-25 LAB — BASIC METABOLIC PANEL
ANION GAP: 9 (ref 5–15)
BUN: 22 mg/dL — ABNORMAL HIGH (ref 6–20)
CO2: 20 mmol/L — AB (ref 22–32)
Calcium: 8.9 mg/dL (ref 8.9–10.3)
Chloride: 107 mmol/L (ref 101–111)
Creatinine, Ser: 1.61 mg/dL — ABNORMAL HIGH (ref 0.61–1.24)
GFR calc Af Amer: 47 mL/min — ABNORMAL LOW (ref >60)
GFR calc non Af Amer: 40 mL/min — ABNORMAL LOW (ref >60)
GLUCOSE: 73 mg/dL (ref 65–99)
POTASSIUM: 5.1 mmol/L (ref 3.5–5.1)
Sodium: 136 mmol/L (ref 135–145)

## 2016-07-25 LAB — CBC
HEMATOCRIT: 30.8 % — AB (ref 39.0–52.0)
HEMOGLOBIN: 9.6 g/dL — AB (ref 13.0–17.0)
MCH: 22.7 pg — ABNORMAL LOW (ref 26.0–34.0)
MCHC: 31.2 g/dL (ref 30.0–36.0)
MCV: 73 fL — ABNORMAL LOW (ref 78.0–100.0)
Platelets: 232 10*3/uL (ref 150–400)
RBC: 4.22 MIL/uL (ref 4.22–5.81)
RDW: 19.1 % — ABNORMAL HIGH (ref 11.5–15.5)
WBC: 12.4 10*3/uL — AB (ref 4.0–10.5)

## 2016-07-25 LAB — URINE CULTURE: Culture: NO GROWTH

## 2016-07-25 MED ORDER — LEVOFLOXACIN 500 MG PO TABS
250.0000 mg | ORAL_TABLET | Freq: Every day | ORAL | Status: AC
Start: 2016-07-26 — End: 2016-07-26
  Administered 2016-07-26: 250 mg via ORAL
  Filled 2016-07-25: qty 1

## 2016-07-25 NOTE — Clinical Social Work Note (Signed)
Clinical Social Work Assessment  Patient Details  Name: Caleb Hays MRN: [id number] Date of Birth: [birth year]  Date of referral:  07/25/16               Reason for consult:  Facility Placement                Permission sought to share information with:  Family Supports Permission granted to share information::  Yes, Verbal Permission Granted  Name::     Alfonso Ramus  Relationship::  Niece  Contact Information:  [phone]  Housing/Transportation Living arrangements for the past 2 months:  [address] Passenger transport manager) Source of Information:  Patient Patient Interpreter Needed:  None Criminal Activity/Legal Involvement Pertinent to Current Situation/Hospitalization:  No - Comment as needed Significant Relationships:  Other Family Members Lives with:  Facility Resident Do you feel safe going back to the place where you live?  Yes Need for family participation in patient care:  Yes (Comment)  Care giving concerns:  No family/friends currently at bedside.   Social Worker assessment / plan:  Holiday representative met with patient at bedside to offer support and discuss patient needs at discharge.  Patient states that he is a current resident at [facility] and plans to return at discharge.  Patient plans to update family on his return.  Patient claims that he made transportation arrangements with [facility] for return - CSW left message with admissions coordinator to confirm transportation.  CSW to verify with admissions coordinator if updated insurance authorization needs to be obtained prior to return.  CSW remains available for support and to facilitate patient discharge needs once medically stable.  Employment status:  Disabled (Comment on whether or not currently receiving Disability) Insurance information:  Programmer, applications, Medicaid In [place] PT Recommendations:  [place] / Referral to community resources:  [place]  Patient/Family's Response to care:  Patient verbalized understanding and appreciation for CSW support and involvement.  Patient is agreeable to return and only requests that [facility] provide return transportation.  Patient/Family's Understanding of and Emotional Response to Diagnosis, Current Treatment, and Prognosis:  Patient understanding with diagnosis and in good spirits regarding his new amputations.  Patient is aware of his limitations and continues to remain at SNF due to limited family support.  Emotional Assessment Appearance:  Appears stated age Attitude/Demeanor/Rapport:   (Appropriate and Engaged) Affect (typically observed):  Accepting, Calm, Appropriate Orientation:  Oriented to Self, Oriented to Situation, Oriented to Place, Oriented to  Time Alcohol / Substance use:  Not Applicable Psych involvement (Current and /or in the community):  No (Comment)  Discharge Needs  Concerns to be addressed:  Discharge Planning Concerns Readmission within the last 30 days:  No Current discharge risk:  Dependent with Mobility Barriers to Discharge:  Continued Medical Work up  [employer], [place]

## 2016-07-25 NOTE — Evaluation (Signed)
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  Co-morbid dx include PVD, Right BKA, COPD, CKD, gout, and GERD  Clinical Impression  Pt is initially resistant to therapy this session. PT and OT performed assessment together as pt has been resistant to therapy evaluations. Pt is Mod I for bed mobs and requires assistance for transfers this session. Prior to admission, pt has been living at SNF and receiving therapy. Pt will benefit from continued therapy once he returns to SNF but does not require any further acute therapy follow-up at this time. All education was complete and will defer therapy to next venue of care.     Follow Up Recommendations SNF    Equipment Recommendations  Other (comment);None recommended by PT (defer to next venue of care)    Recommendations for Other Services       Precautions / Restrictions Precautions Precautions: Fall Precaution Comments: Right BKA  Restrictions Weight Bearing Restrictions: Yes LLE Weight Bearing: Touchdown weight bearing      Mobility  Bed Mobility Overal bed mobility: Modified Independent             General bed mobility comments: Able to get EOB without assistance  Transfers Overall transfer level: Needs assistance Equipment used: None Transfers: Squat Pivot Transfers     Squat pivot transfers: Min assist     General transfer comment: Min A to transfer from bed to recliner. Pt with good upper body strength to assist  Ambulation/Gait                Stairs            Wheelchair Mobility    Modified Rankin (Stroke Patients Only)       Balance Overall balance assessment: Needs assistance Sitting-balance support: No upper extremity supported;Feet supported Sitting balance-Leahy Scale: Normal Sitting balance -  Comments: Able to perform static and dynamic balance without assistance                                     Pertinent Vitals/Pain Pain Assessment: No/denies pain    Home Living Family/patient expects to be discharged to:: Skilled nursing facility                 Additional Comments: Has lived at SNF for past 8 months    Prior Function Level of Independence: Needs assistance   Gait / Transfers Assistance Needed: was working on ambulation with prosthesis at SNF  ADL's / Homemaking Assistance Needed: Needs ~50% assist to bathe and dress.  Comments: Pt is limited with gait due to recent amputation and will require assistance for adls and iadls     Hand Dominance   Dominant Hand: Right    Extremity/Trunk Assessment   Upper Extremity Assessment Upper Extremity Assessment: Defer to OT evaluation    Lower Extremity Assessment Lower Extremity Assessment: Generalized weakness       Communication   Communication: HOH  Cognition Arousal/Alertness: Awake/alert Behavior During Therapy: WFL for tasks assessed/performed Overall Cognitive Status: Within Functional Limits for tasks assessed                 General Comments: Pt initially resistant to therapy, but agreeable to participate     General Comments        Exercises     Assessment/Plan    PT Assessment All further PT needs can be met in the next venue of care  PT Problem List Decreased strength;Decreased activity tolerance;Decreased balance;Decreased mobility;Decreased knowledge of use of DME          PT Treatment Interventions      PT Goals (Current goals can be found in the Care Plan section)  Acute Rehab PT Goals Patient Stated Goal: to get back to SNF PT Goal Formulation: With patient Time For Goal Achievement: 08/01/16 Potential to Achieve Goals: Good    Frequency     Barriers to discharge        Co-evaluation               End of Session Equipment Utilized During  Treatment: Gait belt Activity Tolerance: Patient tolerated treatment well Patient left: in chair;with call bell/phone within reach Nurse Communication: Mobility status         Time: 1442-1455 PT Time Calculation (min) (ACUTE ONLY): 13 min   Charges:   PT Evaluation $PT Eval Moderate Complexity: 1 Procedure     PT G Codes:        Sabra M. Kress PT, DPT  [phone]  07/25/2016, 3:19 PM   

## 2016-07-25 NOTE — NC FL2 (Signed)
[place] MEDICAID FL2 LEVEL OF CARE SCREENING TOOL     IDENTIFICATION  Patient Name: Caleb MarinerJerald J Mcclaran Birthdate: 05/26/1941 Sex: male Admission Date (Current Location): 07/22/2016  [facility] [place]   Facility and Address:  [facility], [address], [place], [place] [address]      Provider Number: [id number]  Attending Physician Name and Address:  Ankit Joline Maxcyhirag Amin, MD  Relative Name and Phone Number:       Current Level of Care: Hospital Recommended Level of Care: Skilled Nursing Facility Prior Approval Number:    Date Approved/Denied:   PASRR Number: [id number]  Discharge Plan: SNF    Current Diagnoses: Patient Active Problem List   Diagnosis Date Noted  . Gangrene of toe of left foot (HCC)   . Toe infection 07/22/2016  . Osteomyelitis (HCC) 07/22/2016  . Skin ulcer of second toe of left foot (HCC) 07/01/2016  . Edema 05/05/2016  . Phantom pain 03/08/2016  . 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 Gout 03/08/2016  . Hypothyroidism (acquired) 03/08/2016  . CKD (chronic kidney disease) stage 3, GFR 30-59 ml/min 03/08/2016  . GERD without esophagitis 03/08/2016  . Constipation 12/29/2015  . Below knee amputation status (HCC) 10/01/2015  . Ankle pain 08/13/2015  . Adult failure to thrive   . PVD (peripheral vascular disease) (HCC)   . Acute kidney injury superimposed on chronic kidney disease (HCC) 08/10/2015  . Elevated blood uric acid level 08/10/2015  . 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 Anemia of chronic disease 08/10/2015  . Malnutrition of moderate degree 06/24/2015  . UTI (urinary tract infection) 06/23/2015  . Failure to thrive in adult 06/23/2015  . Weight loss 06/23/2015  . Tobacco abuse 06/23/2015  . COPD (chronic obstructive pulmonary disease) (HCC) 06/23/2015  . Fall   . Critical lower limb ischemia 05/31/2014  . Status post right foot surgery 03/12/2014    Orientation  RESPIRATION BLADDER Height & Weight     Self, Time, Situation, Place  O2 (6L) Continent Weight: 174 lb (78.9 kg) Height:  6\' 4"  (193 cm)  BEHAVIORAL SYMPTOMS/MOOD NEUROLOGICAL BOWEL NUTRITION STATUS      Continent Diet (Carb Modified with Thin Liquids)  AMBULATORY STATUS COMMUNICATION OF NEEDS Skin   Extensive Assist Verbally Surgical wounds (Foot Incision)                       Personal Care Assistance Level of Assistance  Bathing, Feeding, Dressing Bathing Assistance: Limited assistance Feeding assistance: Independent Dressing Assistance: Limited assistance     Functional Limitations Info  Sight, Hearing, Speech Sight Info: Adequate Hearing Info: Adequate Speech Info: Adequate    SPECIAL CARE FACTORS FREQUENCY  PT (By licensed PT), OT (By licensed OT)     PT Frequency: 3x week OT Frequency: 3x week            Contractures Contractures Info: Not present    Additional Factors Info  Code Status, Allergies Code Status Info: Full Code Allergies Info: No Known Allergies           Current Medications (07/25/2016):  This is the current hospital active medication list Current Facility-Administered Medications  Medication Dose Route Frequency Provider Last Rate Last Dose  . 0.9 %  sodium chloride infusion  250 mL Intravenous PRN Penny Piarlando Vega, MD      . 0.9 %  sodium chloride infusion   Intravenous Continuous Nadara MustardMarcus V Duda, MD  10 mL/hr at 07/23/16 1515 10 mL/hr at 07/23/16 1515  . acetaminophen (TYLENOL) tablet 650 mg  650 mg Oral Q6H PRN Nadara Mustard, MD       Or  . acetaminophen (TYLENOL) suppository 650 mg  650 mg Rectal Q6H PRN Nadara Mustard, MD      . albuterol (PROVENTIL) (2.5 MG/3ML) 0.083% nebulizer solution 2.5 mg  2.5 mg Inhalation Q4H PRN Penny Pia, MD      . cholecalciferol (VITAMIN D) tablet 1,000 Units  1,000 Units Oral Daily Penny Pia, MD   1,000 Units at 07/25/16 0932  . colchicine tablet 0.6 mg  0.6 mg Oral Daily Nadara Mustard, MD   0.6 mg at  07/25/16 0933  . heparin injection 5,000 Units  5,000 Units Subcutaneous Q8H Penny Pia, MD   5,000 Units at 07/25/16 0517  . lactated ringers infusion   Intravenous Continuous Val Eagle, MD 10 mL/hr at 07/23/16 1351    . [START ON 07/26/2016] levofloxacin (LEVAQUIN) tablet 250 mg  250 mg Oral Daily Lynita Lombard [place], RPH      . levothyroxine (SYNTHROID, LEVOTHROID) tablet 12.5 mcg  12.5 mcg Oral QAC breakfast Penny Pia, MD   12.5 mcg at 07/25/16 0700  . methocarbamol (ROBAXIN) tablet 500 mg  500 mg Oral Q6H PRN Nadara Mustard, MD   500 mg at 07/25/16 0933   Or  . methocarbamol (ROBAXIN) 500 mg in dextrose 5 % 50 mL IVPB  500 mg Intravenous Q6H PRN Nadara Mustard, MD      . methocarbamol (ROBAXIN) tablet 500 mg  500 mg Oral Q6H PRN Penny Pia, MD   500 mg at 07/23/16 0610  . metoCLOPramide (REGLAN) tablet 5-10 mg  5-10 mg Oral Q8H PRN Nadara Mustard, MD       Or  . metoCLOPramide (REGLAN) injection 5-10 mg  5-10 mg Intravenous Q8H PRN Nadara Mustard, MD      . multivitamin with minerals tablet 1 tablet  1 tablet Oral Daily Penny Pia, MD   1 tablet at 07/25/16 0932  . ondansetron (ZOFRAN) tablet 4 mg  4 mg Oral Q6H PRN Nadara Mustard, MD       Or  . ondansetron [facility]) injection 4 mg  4 mg Intravenous Q6H PRN Nadara Mustard, MD      . oxyCODONE-acetaminophen (PERCOCET/ROXICET) 5-325 MG per tablet 1 tablet  1 tablet Oral Q4H PRN Penny Pia, MD   1 tablet at 07/25/16 0933   And  . oxyCODONE (Oxy IR/ROXICODONE) immediate release tablet 5 mg  5 mg Oral Q4H PRN Penny Pia, MD   5 mg at 07/25/16 0933  . pantoprazole (PROTONIX) EC tablet 40 mg  40 mg Oral Daily Penny Pia, MD   40 mg at 07/25/16 0933  . senna (SENOKOT) tablet 8.6 mg  1 tablet Oral BID Penny Pia, MD   8.6 mg at 07/24/16 2100  . sodium chloride flush (NS) 0.9 % injection 3 mL  3 mL Intravenous Q12H Penny Pia, MD   3 mL at 07/25/16 0934  . sodium chloride flush (NS) 0.9 % injection 3 mL  3 mL Intravenous PRN Penny Pia, MD      . sodium polystyrene (KAYEXALATE) 15 GM/60ML suspension 15 g  15 g Oral Once Penny Pia, MD      . traZODone (DESYREL) tablet 50 mg  50 mg Oral QHS Penny Pia, MD   50 mg at 07/24/16 2100  Discharge Medications: Please see discharge summary for a list of discharge medications.  Relevant Imaging Results:  Relevant Lab Results:   Additional Information SSn [id number]   Macario Golds, [place] [phone]

## 2016-07-25 NOTE — Progress Notes (Signed)
PROGRESS NOTE    Caleb Hays  [id number] DOB: 04-22-1941 DOA: 07/22/2016 PCP: [name], [name], MD   Brief Narrative:  Patient admitted for nonhealing left toe gangrene from his wound care office. S/p Amputation, lots of tophus noted during the surgery. Patient started on Colcihine. He is doing well postop.   Assessment & Plan:   Active Problems:   COPD (chronic obstructive pulmonary disease) (HCC)   PVD (peripheral vascular disease) (HCC)   Hypothyroidism (acquired)   CKD (chronic kidney disease) stage 3, GFR 30-59 ml/min   GERD without esophagitis   Toe infection   Osteomyelitis (HCC)   Gangrene of toe of left foot (HCC)  Assessment/Plan   Osteomyelitis (HCC)/ Toe infection: POD #1 Left 2nd Toe Amputation - stop Abx, s/p Amputation - ok per Ortho to be discharged, CM/SW consulted to have him be placed back  - I have discussed the case with Dr Allyson SabalBerry who will follow up with him outpatient to discussed the need for angio and revascularization options. Appreciate his input.  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   UTI -done with levaquin course; no growth on micro    COPD (chronic obstructive pulmonary disease) (HCC) - stable, continue home medication regimen    Hypothyroidism (acquired) - continue synthroid, stable no hypothyroidism symptoms reported    GERD without esophagitis  - stable continue PPI  Hyperkalemia - slightly improved   CKD stage III -gentle hydration   PVD -hx of critical limb ischemia and severe PVD -outpatient follow up with Dr Allyson SabalBerry, I have discussed the case with him.   DVT prophylaxis: heparin Code Status: full Family Communication: none at bedside Disposition Plan: med suirg Consults called: Ortho [place] ([facility]) Admission status: obs    Consultants:   Ortho   Procedures:   POD#2Left 2nd Toe Amputation  Antimicrobials:   Currently on vanc and zosyn     Subjective: No new complaints this morning. He remains afebrile. Refuses to work with PT while he is here.   Objective: Vitals:   07/24/16 0456 07/24/16 1500 07/24/16 2058 07/25/16 0434  BP: 139/60 (!) 142/87 (!) 111/55 (!) 120/57  Pulse: (!) 56 75 61 75  Resp: 17 18    Temp: 97.4 F (36.3 C) 97.9 F (36.6 C) 98.4 F (36.9 C) 98.4 F (36.9 C)  TempSrc: Axillary Axillary Oral Oral  SpO2: 100% 99% 98% 93%  Weight:      Height:        Intake/Output Summary (Last 24 hours) at 07/25/16 1322 Last data filed at 07/25/16 0934  Gross per 24 hour  Intake          1350.34 ml  Output             1000 ml  Net           350.34 ml   Filed Weights   07/22/16 1540  Weight: 78.9 kg (174 lb)    Examination:  General exam: Appears calm and comfortable  Respiratory system: Clear to auscultation. Respiratory effort normal. Cardiovascular system: S1 & S2 heard, RRR. No JVD, murmurs, rubs, gallops or clicks. No pedal edema. Gastrointestinal system: Abdomen is nondistended, soft and nontender. No organomegaly or masses felt. Normal bowel sounds heard. Central nervous system: Alert and oriented. No focal neurological deficits. Extremities: RLE amputated below knee, left foot dressing in place  Skin: No rashes, lesions or ulcers Psychiatry: Judgement and insight appear normal. Mood & affect appropriate.  Data Reviewed:   CBC:  Recent Labs Lab 07/22/16 1626 07/23/16 0826 07/24/16 0636 07/25/16 0336  WBC 17.2* 14.1* 13.8* 12.4*  NEUTROABS 10.3*  --   --   --   HGB 10.6* 9.3* 9.8* 9.6*  HCT 33.8* 30.0* 31.2* 30.8*  MCV 72.7* 72.6* 72.7* 73.0*  PLT 293 258 261 232   Basic Metabolic Panel:  Recent Labs Lab 07/22/16 1626 07/23/16 0826 07/24/16 0636 07/25/16 0336  NA 137 135 134* 136  K 5.3* 5.0 5.1 5.1  CL 106 108 105 107  CO2 22 22 22  20*  GLUCOSE 89 90 90 73  BUN 32* 27* 24* 22*  CREATININE 1.53* 1.62* 1.52* 1.61*  CALCIUM 9.2 8.7* 8.9 8.9   GFR: Estimated  Creatinine Clearance: 44.9 mL/min (by C-G formula based on SCr of 1.61 mg/dL (H)). Liver Function Tests: No results for input(s): AST, ALT, ALKPHOS, BILITOT, PROT, ALBUMIN in the last 168 hours. No results for input(s): LIPASE, AMYLASE in the last 168 hours. No results for input(s): AMMONIA in the last 168 hours. Coagulation Profile: No results for input(s): INR, PROTIME in the last 168 hours. Cardiac Enzymes: No results for input(s): CKTOTAL, CKMB, CKMBINDEX, TROPONINI in the last 168 hours. BNP (last 3 results) No results for input(s): PROBNP in the last 8760 hours. HbA1C:  Recent Labs  07/23/16 0826  HGBA1C 5.4   CBG: No results for input(s): GLUCAP in the last 168 hours. Lipid Profile: No results for input(s): CHOL, HDL, LDLCALC, TRIG, CHOLHDL, LDLDIRECT in the last 72 hours. Thyroid Function Tests: No results for input(s): TSH, T4TOTAL, FREET4, T3FREE, THYROIDAB in the last 72 hours. Anemia Panel: No results for input(s): VITAMINB12, FOLATE, FERRITIN, TIBC, IRON, RETICCTPCT in the last 72 hours. Sepsis Labs: No results for input(s): PROCALCITON, LATICACIDVEN in the last 168 hours.  Recent Results (from the past 240 hour(s))  Culture, Urine     Status: None   Collection Time: 07/24/16 10:03 AM  Result Value Ref Range Status   Specimen Description URINE, CLEAN CATCH  Final   Special Requests NONE  Final   Culture NO GROWTH  Final   Report Status 07/25/2016 FINAL  Final         Radiology Studies: No results found.      Scheduled Meds: . cholecalciferol  1,000 Units Oral Daily  . colchicine  0.6 mg Oral Daily  . heparin  5,000 Units Subcutaneous Q8H  . [START ON 07/26/2016] levofloxacin  250 mg Oral Daily  . levothyroxine  12.5 mcg Oral QAC breakfast  . multivitamin with minerals  1 tablet Oral Daily  . pantoprazole  40 mg Oral Daily  . senna  1 tablet Oral BID  . sodium chloride flush  3 mL Intravenous Q12H  . sodium polystyrene  15 g Oral Once  .  traZODone  50 mg Oral QHS   Continuous Infusions: . sodium chloride 10 mL/hr (07/23/16 1515)  . lactated ringers 10 mL/hr at 07/23/16 1351     LOS: 3 days    Time spent: 35 mins     Ayesha Markwell Joline Maxcy, MD [facility] Pager [phone]   If 7PM-7AM, please contact night-coverage [email or web address] Password [email or web address] 07/25/2016, 1:22 PM

## 2016-07-25 NOTE — Evaluation (Signed)
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  Co-morbid dx include PVD, Right BKA, COPD, CKD, gout, and GERD   Clinical Impression   PTA, pt was living in SNF for the past 8 months. He requires moderate assistance with all ADL and was working with PT to improve ability to ambulate with prosthesis. Pt demonstrated modified independence with bed mobility in preparation for seated ADL and completed squat pivot simulated toilet transfer without prothesis with min assist. Pt would benefit from continued OT services once he returns to SNF but no further acute OT needs identified. OT will defer further therapy to next venue of care.    Follow Up Recommendations  SNF    Equipment Recommendations  None recommended by OT    Recommendations for Other Services       Precautions / Restrictions Precautions Precautions: Fall Precaution Comments: Right BKA  Restrictions Weight Bearing Restrictions: Yes LLE Weight Bearing: Touchdown weight bearing      Mobility Bed Mobility Overal bed mobility: Modified Independent             General bed mobility comments: Able to get EOB without assistance  Transfers Overall transfer level: Needs assistance Equipment used: None Transfers: Squat Pivot Transfers     Squat pivot transfers: Min assist     General transfer comment: Min A to transfer from bed to recliner. Pt with good upper body strength to assist    Balance Overall balance assessment: Needs assistance Sitting-balance support: No upper extremity supported;Feet supported Sitting balance-Leahy Scale: Normal Sitting balance - Comments: Able to perform static and dynamic balance without assistance                                    ADL Overall ADL's : At baseline                                        General ADL Comments: Pt requires mod assist at baseline for ADL and is functioning at this level.     Vision Vision Assessment?: No apparent visual deficits   Perception     Praxis      Pertinent Vitals/Pain Pain Assessment: No/denies pain     Hand Dominance Right   Extremity/Trunk Assessment Upper Extremity Assessment Upper Extremity Assessment: Overall WFL for tasks assessed   Lower Extremity Assessment Lower Extremity Assessment: Defer to PT evaluation       Communication Communication Communication: HOH   Cognition Arousal/Alertness: Awake/alert Behavior During Therapy: WFL for tasks assessed/performed Overall Cognitive Status: Within Functional Limits for tasks assessed                 General Comments: Pt initially resistant to therapy, but agreeable to participate    General Comments       Exercises       Shoulder Instructions      Home Living Family/patient expects to be discharged to:: Skilled nursing facility                                 Additional Comments: Has lived at SNF for past 8  months      Prior Functioning/Environment Level of Independence: Needs assistance  Gait / Transfers Assistance Needed: was working on ambulation with prosthesis at SNF ADL's / Homemaking Assistance Needed: Needs ~50% assist to bathe and dress.   Comments: Pt is limited with gait due to recent amputation and will require assistance for adls and iadls        OT Problem List: Decreased strength;Decreased range of motion;Decreased activity tolerance;Impaired balance (sitting and/or standing);Decreased safety awareness;Decreased knowledge of use of DME or AE;Decreased knowledge of precautions;Pain   OT Treatment/Interventions:      OT Goals(Current goals can be found in the care plan section) Acute Rehab OT Goals Patient Stated Goal: to get back to SNF OT Goal Formulation: With patient Time For Goal  Achievement: 08/01/16 Potential to Achieve Goals: Good  OT Frequency:     Barriers to D/C:            Co-evaluation PT/OT/SLP Co-Evaluation/Treatment: Yes Reason for Co-Treatment: For patient/therapist safety   OT goals addressed during session: ADL's and self-care      End of Session Equipment Utilized During Treatment: Gait belt Nurse Communication: Mobility status  Activity Tolerance: Patient tolerated treatment well Patient left: in chair;with call bell/phone within reach   Time: 1442-1455 OT Time Calculation (min): 13 min Charges:  OT General Charges $OT Visit: 1 Procedure OT Evaluation $OT Eval Moderate Complexity: 1 Procedure  Doristine Section, OTR/L [phone] 07/25/2016, 5:14 PM

## 2016-07-26 ENCOUNTER — Encounter (HOSPITAL_COMMUNITY): Payer: Self-pay | Admitting: *Deleted

## 2016-07-26 LAB — CBC
HEMATOCRIT: 32.7 % — AB (ref 39.0–52.0)
HEMOGLOBIN: 10.4 g/dL — AB (ref 13.0–17.0)
MCH: 23 pg — ABNORMAL LOW (ref 26.0–34.0)
MCHC: 31.8 g/dL (ref 30.0–36.0)
MCV: 72.2 fL — ABNORMAL LOW (ref 78.0–100.0)
Platelets: 268 10*3/uL (ref 150–400)
RBC: 4.53 MIL/uL (ref 4.22–5.81)
RDW: 18.9 % — ABNORMAL HIGH (ref 11.5–15.5)
WBC: 12.4 10*3/uL — ABNORMAL HIGH (ref 4.0–10.5)

## 2016-07-26 LAB — BASIC METABOLIC PANEL
ANION GAP: 8 (ref 5–15)
BUN: 25 mg/dL — ABNORMAL HIGH (ref 6–20)
CHLORIDE: 104 mmol/L (ref 101–111)
CO2: 24 mmol/L (ref 22–32)
CREATININE: 1.69 mg/dL — AB (ref 0.61–1.24)
Calcium: 9 mg/dL (ref 8.9–10.3)
GFR calc non Af Amer: 38 mL/min — ABNORMAL LOW (ref >60)
GFR, EST AFRICAN AMERICAN: 44 mL/min — AB (ref >60)
Glucose, Bld: 87 mg/dL (ref 65–99)
Potassium: 4.5 mmol/L (ref 3.5–5.1)
Sodium: 136 mmol/L (ref 135–145)

## 2016-07-26 NOTE — Progress Notes (Signed)
Message to MD making him aware that pt would not be discharged to facility today due to insurance reasons.

## 2016-07-26 NOTE — Care Management Important Message (Signed)
Important Message  Patient Details  Name: Caleb MarinerJerald J Kasson MRN: [id number] Date of Birth: 07/14/1941   Medicare Important Message Given:  Yes    Dorena BodoIris Duston Smolenski 07/26/2016, 3:36 PM

## 2016-07-26 NOTE — Discharge Summary (Addendum)
Physician Discharge Summary  Caleb Hays ZOX:[id number] DOB: Oct 03, 1940 DOA: 07/22/2016  PCP: Oneal Grout, MD  Admit date: 07/22/2016 Discharge date: 07/26/2016  Admitted From: SNF Disposition:  SNF-  [facility]   Recommendations for Outpatient Follow-up:  1. Follow up with PCP in 1-2 weeks 2. Follow up with Dr Allyson Sabal on Jan 10th at 9am  3. Follow up with Orthopedic Dr Lajoyce Corners in 2 weeks as well   Home Health:NO Equipment/Devices: None   Discharge Condition:Stable CODE STATUS:FULL Diet recommendation: Prehospitalization diet    Brief/Interim Summary: 76 y.o. male with medical history significant of tobacco abuse, hypothyroidism, gerd, copd who presents with a 17 month old toe wound. He was noted to have osteomyelitis of the toe on the imaging. Dr Lajoyce Corners was consulted who performed an amputatin of the toe which he tolerated well. Post Op he did well off Antibiotics. He was evaluated by PT who recommended SNF therefore SW was consulted who made the arrangement.  Today he has reached maximum benefit from hospital stay. He is stable to be discharged.  Oxy IR script given (30 tabs)  Discharge Diagnoses:  Active Problems:   COPD (chronic obstructive pulmonary disease) (HCC)   PVD (peripheral vascular disease) (HCC)   Hypothyroidism (acquired)   CKD (chronic kidney disease) stage 3, GFR 30-59 ml/min   GERD without esophagitis   Toe infection   Osteomyelitis (HCC)   Gangrene of toe of left foot (HCC)  Osteomyelitis (HCC)/Toe infection: POD #3 Left 2nd Toe Amputation - stop Abx, s/p Amputation 07/23/16 - ok per Ortho to be discharged, pending placement  - ok to go back to the rehab today with outpatient follow up with Dr Lajoyce Corners and Dr Allyson Sabal (appoinment made on Jan 10th at 9am)  Gout -normal uric acid 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   Discharge Instructions   Allergies as of 07/26/2016   No Known Allergies     Medication List    STOP taking these medications   saccharomyces boulardii 250 MG capsule Commonly known as:  FLORASTOR     TAKE these medications   albuterol 108 (90 Base) MCG/ACT inhaler Commonly known as:  PROVENTIL HFA;VENTOLIN HFA Inhale 2 puffs into the lungs 2 (two) times daily. Take every daily per niece   allopurinol 100 MG tablet Commonly known as:  ZYLOPRIM Take 100 mg by mouth daily.   cholecalciferol 1000 units tablet Commonly known as:  VITAMIN D Take 1,000 Units by mouth daily. Reported on 11/06/2015   famotidine 20 MG tablet Commonly known as:  PEPCID Take 20 mg by mouth daily. For GERD.   levothyroxine 25 MCG tablet Commonly known as:  SYNTHROID, LEVOTHROID Take 12.5 mcg by mouth daily before breakfast.   loperamide 2 MG tablet Commonly known as:  IMODIUM A-D Give 1 - 2 tablets ( 2mg  - 4mg ) after initial dose following  each loose stool following. Do not give if < 3 stools or Dx c.diff or norovirus. So do not exceed >  16 mg/24 hr period.   meloxicam 7.5 MG tablet Commonly known as:  MOBIC Take 7.5 mg by mouth daily.   methocarbamol 500 MG tablet Commonly known as:  ROBAXIN Take 500 mg by mouth every 6 (six) hours as needed for muscle spasms.   multivitamin with minerals tablet Take 1 tablet by mouth daily.   omeprazole 20 MG capsule Commonly known as:  PRILOSEC Take 20 mg by mouth daily.   oxyCODONE-acetaminophen 10-325 MG tablet Commonly known as:   PERCOCET Take one tablet every 4 hours as needed for moderate pain or severe pain DO NOT EXCEED 4GM OF APAP IN 24 HOURS FROM ALL SOURCES   senna 8.6 MG Tabs tablet Commonly known as:  SENOKOT Take 1 tablet (8.6 mg total) by mouth 2 (two) times daily.   traZODone 50 MG tablet Commonly known as:  DESYREL Take 50 mg by mouth at bedtime.       Contact information for follow-up providers    Nadara MustardMarcus V Duda, MD Follow up in 2 week(s).   Specialty:  Orthopedic Surgery Contact information: [address] [place] [address] [phone]        Nanetta BattyBerry, Jonathan, MD. Go on 07/28/2016.   Specialties:  Cardiology, Radiology Why:  Jan 10th at [facility]:00am Contact information: [address] [place] [address] [phone]            Contact information for after-discharge care    Destination    [facility] Follow up.   Specialty:  Skilled Nursing Facility Contact information: [address] [place] [id number] [phone]                 No Known Allergies  Consultations:  Orthopedics    Procedures/Studies: Dg Foot 2 Views Left  Result Date: 07/22/2016 CLINICAL DATA:  Open wound on the second toe, evaluate for osteomyelitis EXAM: LEFT FOOT - 2 VIEW COMPARISON:  None. FINDINGS: There is air in the soft tissues of the distal left second toe which could be due to an open wound, but infection cannot be excluded. There does appear to be some loss of the cortical margin of the tuft of the distal phalanx along its lateral aspect and osteomyelitis cannot be excluded. The left second toe is not seen on the lateral view in detail. Tarsal metatarsal alignment is normal. Joint spaces appear normal. No erosion is seen. IMPRESSION: Cannot exclude osteomyelitis involving the distal phalanx of the left second toe. Electronically Signed   By: Dwyane DeePaul  Barry M.D.   On: 07/22/2016 17:02       Subjective:   Discharge Exam: Vitals:    07/26/16 0400 07/26/16 1330  BP: 128/68 102/62  Pulse: 69 65  Resp: 18 17  Temp: 98 F (36.7 C) 97.7 F (36.5 C)   Vitals:   07/25/16 0434 07/25/16 2047 07/26/16 0400 07/26/16 1330  BP: (!) 120/57 126/63 128/68 102/62  Pulse: 75 73 69 65  Resp:   18 17  Temp: 98.4 F (36.9 C) 97.8 F (36.6 C) 98 F (36.7 C) 97.7 F (36.5 C)  TempSrc: Oral Oral Oral Oral  SpO2: 93% 97% 94% 98%  Weight:      Height:        General: Pt is alert, awake, not in acute distress Cardiovascular: RRR, S1/S2 +, no rubs, no gallops Respiratory: CTA bilaterally, no wheezing, no rhonchi Abdominal: Soft, NT, ND, bowel sounds + Extremities: no edema, no cyanosis    The results of  significant diagnostics from this hospitalization (including imaging, microbiology, ancillary and laboratory) are listed below for reference.     Microbiology: Recent Results (from the past 240 hour(s))  Culture, Urine     Status: None   Collection Time: 07/24/16 10:03 AM  Result Value Ref Range Status   Specimen Description URINE, CLEAN CATCH  Final   Special Requests NONE  Final   Culture NO GROWTH  Final   Report Status 07/25/2016 FINAL  Final     Labs: BNP (last 3 results) No results for input(s): BNP in the last 8760 hours. Basic Metabolic Panel:  Recent Labs Lab 07/22/16 1626 07/23/16 0826 07/24/16 0636 07/25/16 0336 07/26/16 0746  NA 137 135 134* 136 136  K 5.3* 5.0 5.1 5.1 4.5  CL 106 108 105 107 104  CO2 22 22 22  20* 24  GLUCOSE 89 90 90 73 87  BUN 32* 27* 24* 22* 25*  CREATININE 1.53* 1.62* 1.52* 1.61* 1.69*  CALCIUM 9.2 8.7* 8.9 8.9 9.0   Liver Function Tests: No results for input(s): AST, ALT, ALKPHOS, BILITOT, PROT, ALBUMIN in the last 168 hours. No results for input(s): LIPASE, AMYLASE in the last 168 hours. No results for input(s): AMMONIA in the last 168 hours. CBC:  Recent Labs Lab 07/22/16 1626 07/23/16 0826 07/24/16 0636 07/25/16 0336 07/26/16 0746  WBC 17.2* 14.1* 13.8*  12.4* 12.4*  NEUTROABS 10.3*  --   --   --   --   HGB 10.6* 9.3* 9.8* 9.6* 10.4*  HCT 33.8* 30.0* 31.2* 30.8* 32.7*  MCV 72.7* 72.6* 72.7* 73.0* 72.2*  PLT 293 258 261 232 268   Cardiac Enzymes: No results for input(s): CKTOTAL, CKMB, CKMBINDEX, TROPONINI in the last 168 hours. BNP: Invalid input(s): POCBNP CBG: No results for input(s): GLUCAP in the last 168 hours. D-Dimer No results for input(s): DDIMER in the last 72 hours. Hgb A1c No results for input(s): HGBA1C in the last 72 hours. Lipid Profile No results for input(s): CHOL, HDL, LDLCALC, TRIG, CHOLHDL, LDLDIRECT in the last 72 hours. Thyroid function studies No results for input(s): TSH, T4TOTAL, T3FREE, THYROIDAB in the last 72 hours.  Invalid input(s): FREET3 Anemia work up No results for input(s): VITAMINB12, FOLATE, FERRITIN, TIBC, IRON, RETICCTPCT in the last 72 hours. Urinalysis    Component Value Date/Time   COLORURINE YELLOW 07/22/2016 1631   APPEARANCEUR CLOUDY (A) 07/22/2016 1631   LABSPEC 1.025 07/22/2016 1631   PHURINE 5.5 07/22/2016 1631   GLUCOSEU NEGATIVE 07/22/2016 1631   HGBUR MODERATE (A) 07/22/2016 1631   BILIRUBINUR NEGATIVE 07/22/2016 1631   KETONESUR NEGATIVE 07/22/2016 1631   PROTEINUR 30 (A) 07/22/2016 1631   UROBILINOGEN >8.0 (H) 05/14/2012 1805   NITRITE NEGATIVE 07/22/2016 1631   LEUKOCYTESUR LARGE (A) 07/22/2016 1631   Sepsis Labs Invalid input(s): PROCALCITONIN,  WBC,  LACTICIDVEN Microbiology Recent Results (from the past 240 hour(s))  Culture, Urine     Status: None   Collection Time: 07/24/16 10:03 AM  Result Value Ref Range Status   Specimen Description URINE, CLEAN CATCH  Final   Special Requests NONE  Final   Culture NO GROWTH  Final   Report Status 07/25/2016 FINAL  Final     Time coordinating discharge: Over 30 minutes  SIGNED:   Dimple Nanas, MD  [facility] 07/26/2016, 2:45 PM Pager   If 7PM-7AM, please contact  night-coverage [email or web address] Password [email or web address]

## 2016-07-26 NOTE — Progress Notes (Signed)
PROGRESS NOTE    Caleb DuboisJerald J Hays  NAT:[id number] DOB: February 22, 1941 DOA: 07/22/2016 PCP: [name], [name], MD   Brief Narrative:  Patient admitted for nonhealing left toe gangrene from his wound care office. S/p Amputation, lots of tophus noted during the surgery. Patient started on Colcihine. He is doing well postop. He is stable to go to rehab today  Assessment & Plan:   Active Problems:   COPD (chronic obstructive pulmonary disease) (HCC)   PVD (peripheral vascular disease) (HCC)   Hypothyroidism (acquired)   CKD (chronic kidney disease) stage 3, GFR 30-59 ml/min   GERD without esophagitis   Toe infection   Osteomyelitis (HCC)   Gangrene of toe of left foot (HCC)  Assessment/Plan   Osteomyelitis (HCC)/ Toe infection: POD #3 Left 2nd Toe Amputation - stop Abx, s/p Amputation - ok per Ortho to be discharged, pending placement  - ok to go back to the rehab today with outpatient follow up with Dr Lajoyce Cornersuda and Dr Allyson SabalBerry (appoinment made on Jan 10th at 9am)  Gout -normal uric acid 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   UTI -done with levaquin course; no growth on micro    COPD (chronic obstructive pulmonary disease) (HCC) - stable, continue home medication regimen    Hypothyroidism (acquired) - continue synthroid, stable no hypothyroidism symptoms reported    GERD without esophagitis  - stable continue PPI  Hyperkalemia - slightly improved   CKD stage III -gentle hydration   PVD -hx of critical limb ischemia and severe PVD -outpatient follow up with Dr Allyson SabalBerry, I have discussed the case with him.   DVT prophylaxis: heparin Code Status: full Family Communication: none at bedside Disposition Plan: med suirg Consults called: Ortho [facility] ([facility]) Admission status: obs    Consultants:   Ortho   Procedures:   POD#3Left 2nd Toe Amputation  Antimicrobials:   None    Subjective: No new complaints this morning. He remains  afebrile.   Objective: Vitals:   07/24/16 2058 07/25/16 0434 07/25/16 2047 07/26/16 0400  BP: (!) 111/55 (!) 120/57 126/63 128/68  Pulse: 61 75 73 69  Resp:    18  Temp: 98.4 F (36.9 C) 98.4 F (36.9 C) 97.8 F (36.6 C) 98 F (36.7 C)  TempSrc: Oral Oral Oral Oral  SpO2: 98% 93% 97% 94%  Weight:      Height:        Intake/Output Summary (Last 24 hours) at 07/26/16 1051 Last data filed at 07/26/16 0900  Gross per 24 hour  Intake              720 ml  Output              750 ml  Net              -30 ml   Filed Weights   07/22/16 1540  Weight: 78.9 kg (174 lb)    Examination:  General exam: Appears calm and comfortable  Respiratory system: Clear to auscultation. Respiratory effort normal. Cardiovascular system: S1 & S2 heard, RRR. No JVD, murmurs, rubs, gallops or clicks. No pedal edema. Gastrointestinal system: Abdomen is nondistended, soft and nontender. No organomegaly or masses felt. Normal bowel sounds heard. Central nervous system: Alert and oriented. No focal neurological deficits. Extremities: RLE amputated below knee, left foot dressing in place  Skin: No rashes, lesions or ulcers Psychiatry: Judgement and insight appear normal. Mood & affect appropriate.     Data Reviewed:  CBC:  Recent Labs Lab 07/22/16 1626 07/23/16 0826 07/24/16 0636 07/25/16 0336 07/26/16 0746  WBC 17.2* 14.1* 13.8* 12.4* 12.4*  NEUTROABS 10.3*  --   --   --   --   HGB 10.6* 9.3* 9.8* 9.6* 10.4*  HCT 33.8* 30.0* 31.2* 30.8* 32.7*  MCV 72.7* 72.6* 72.7* 73.0* 72.2*  PLT 293 258 261 232 268   Basic Metabolic Panel:  Recent Labs Lab 07/22/16 1626 07/23/16 0826 07/24/16 0636 07/25/16 0336 07/26/16 0746  NA 137 135 134* 136 136  K 5.3* 5.0 5.1 5.1 4.5  CL 106 108 105 107 104  CO2 22 22 22  20* 24  GLUCOSE 89 90 90 73 87  BUN 32* 27* 24* 22* 25*  CREATININE 1.53* 1.62* 1.52* 1.61* 1.69*  CALCIUM 9.2 8.7* 8.9 8.9 9.0   GFR: Estimated Creatinine Clearance: 42.8  mL/min (by C-G formula based on SCr of 1.69 mg/dL (H)). Liver Function Tests: No results for input(s): AST, ALT, ALKPHOS, BILITOT, PROT, ALBUMIN in the last 168 hours. No results for input(s): LIPASE, AMYLASE in the last 168 hours. No results for input(s): AMMONIA in the last 168 hours. Coagulation Profile: No results for input(s): INR, PROTIME in the last 168 hours. Cardiac Enzymes: No results for input(s): CKTOTAL, CKMB, CKMBINDEX, TROPONINI in the last 168 hours. BNP (last 3 results) No results for input(s): PROBNP in the last 8760 hours. HbA1C: No results for input(s): HGBA1C in the last 72 hours. CBG: No results for input(s): GLUCAP in the last 168 hours. Lipid Profile: No results for input(s): CHOL, HDL, LDLCALC, TRIG, CHOLHDL, LDLDIRECT in the last 72 hours. Thyroid Function Tests: No results for input(s): TSH, T4TOTAL, FREET4, T3FREE, THYROIDAB in the last 72 hours. Anemia Panel: No results for input(s): VITAMINB12, FOLATE, FERRITIN, TIBC, IRON, RETICCTPCT in the last 72 hours. Sepsis Labs: No results for input(s): PROCALCITON, LATICACIDVEN in the last 168 hours.  Recent Results (from the past 240 hour(s))  Culture, Urine     Status: None   Collection Time: 07/24/16 10:03 AM  Result Value Ref Range Status   Specimen Description URINE, CLEAN CATCH  Final   Special Requests NONE  Final   Culture NO GROWTH  Final   Report Status 07/25/2016 FINAL  Final         Radiology Studies: No results found.      Scheduled Meds: . cholecalciferol  1,000 Units Oral Daily  . colchicine  0.6 mg Oral Daily  . heparin  5,000 Units Subcutaneous Q8H  . levofloxacin  250 mg Oral Daily  . levothyroxine  12.5 mcg Oral QAC breakfast  . multivitamin with minerals  1 tablet Oral Daily  . pantoprazole  40 mg Oral Daily  . senna  1 tablet Oral BID  . sodium chloride flush  3 mL Intravenous Q12H  . sodium polystyrene  15 g Oral Once  . traZODone  50 mg Oral QHS   Continuous  Infusions: . sodium chloride 10 mL/hr (07/23/16 1515)  . lactated ringers 10 mL/hr at 07/23/16 1351     LOS: 3 days    Time spent: 35 mins     Ankit Joline Maxcy, MD [facility] Pager [phone]   If 7PM-7AM, please contact night-coverage [email or web address] Password [email or web address] 07/26/2016, 10:51 AM

## 2016-07-26 NOTE — Clinical Social Work Placement (Addendum)
   CLINICAL SOCIAL WORK PLACEMENT  NOTE  Date:  07/26/2016  Patient Details  Name: Caleb Hays MRN: [id number] Date of Birth: Jan 24, 1941  Clinical Social Work is seeking post-discharge placement for this patient at the Skilled  Nursing Facility level of care (*CSW will initial, date and re-position this form in  chart as items are completed):      Patient/family provided with [facility] Clinical Social Work Department's list of facilities offering this level of care within the geographic area requested by the patient (or if unable, by the patient's family).  Yes   Patient/family informed of their freedom to choose among providers that offer the needed level of care, that participate in Medicare, Medicaid or managed care program needed by the patient, have an available bed and are willing to accept the patient.      Patient/family informed of [facility]'s ownership interest in [facility] and [facility], as well as of the fact that they are under no obligation to receive care at these facilities.  PASRR submitted to EDS on       PASRR number received on 07/25/16     Existing PASRR number confirmed on       FL2 transmitted to all facilities in geographic area requested by pt/family on 07/25/16     FL2 transmitted to all facilities within larger geographic area on       Patient informed that his/her managed care company has contracts with or will negotiate with certain facilities, including the following:        Yes   Patient/family informed of bed offers received.  [facility]     Physician recommends and patient chooses bed at      [facility] to be [facility] on 07/28/2016.  Patient to be transferred to facility by [facility]     Patient family notified on 07/28/2016 of transfer.  Name of family member notified:        PHYSICIAN Please prepare prescriptions     Additional Comment:     _______________________________________________ Volney AmericanBridget A Mayton, LCSW 07/26/2016, 4:12 PM

## 2016-07-27 ENCOUNTER — Encounter (HOSPITAL_COMMUNITY): Payer: Self-pay | Admitting: Orthopedic Surgery

## 2016-07-27 LAB — BASIC METABOLIC PANEL
ANION GAP: 7 (ref 5–15)
BUN: 23 mg/dL — ABNORMAL HIGH (ref 6–20)
CALCIUM: 9.4 mg/dL (ref 8.9–10.3)
CO2: 24 mmol/L (ref 22–32)
Chloride: 104 mmol/L (ref 101–111)
Creatinine, Ser: 1.57 mg/dL — ABNORMAL HIGH (ref 0.61–1.24)
GFR, EST AFRICAN AMERICAN: 48 mL/min — AB (ref >60)
GFR, EST NON AFRICAN AMERICAN: 42 mL/min — AB (ref >60)
GLUCOSE: 105 mg/dL — AB (ref 65–99)
POTASSIUM: 4.7 mmol/L (ref 3.5–5.1)
Sodium: 135 mmol/L (ref 135–145)

## 2016-07-27 LAB — CBC
HCT: 33 % — ABNORMAL LOW (ref 39.0–52.0)
HEMOGLOBIN: 10.4 g/dL — AB (ref 13.0–17.0)
MCH: 22.8 pg — ABNORMAL LOW (ref 26.0–34.0)
MCHC: 31.5 g/dL (ref 30.0–36.0)
MCV: 72.4 fL — ABNORMAL LOW (ref 78.0–100.0)
Platelets: 274 10*3/uL (ref 150–400)
RBC: 4.56 MIL/uL (ref 4.22–5.81)
RDW: 19 % — ABNORMAL HIGH (ref 11.5–15.5)
WBC: 13 10*3/uL — AB (ref 4.0–10.5)

## 2016-07-27 MED ORDER — OXYCODONE HCL 5 MG PO TABS: 5.0000 mg | ORAL_TABLET | ORAL | 0 refills | Status: DC | PRN

## 2016-07-27 NOTE — Progress Notes (Signed)
PROGRESS NOTE    Caleb Hays  XBJ:[id number] DOB: [birth year] DOA: 07/22/2016 PCP: Oneal Grout, MD   Brief Narrative:  Patient admitted for nonhealing left toe gangrene from his wound care office. S/p Amputation, lots of tophus noted during the surgery. Patient started on Colcihine. He is doing well postop. He is stable to go to rehab today  Assessment & Plan:   Active Problems:   COPD (chronic obstructive pulmonary disease) (HCC)   PVD (peripheral vascular disease) (HCC)   Hypothyroidism (acquired)   CKD (chronic kidney disease) stage 3, GFR 30-59 ml/min   GERD without esophagitis   Toe infection   Osteomyelitis (HCC)   Gangrene of toe of left foot (HCC)  Assessment/Plan   Osteomyelitis (HCC)/ Toe infection: POD #4 Left 2nd Toe Amputation - stop Abx, s/p Amputation - ok per Ortho to be discharged - ok to go back to the rehab today with outpatient follow up with Dr Lajoyce Corners and Dr Allyson Sabal (appoinment made on Jan 10th at [facility]) - also advised to cont to follow iwht outpatient wound care.   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   UTI -done with levaquin course; no growth on micro    COPD (chronic obstructive pulmonary disease) (HCC) - stable, continue home medication regimen    Hypothyroidism (acquired) - continue synthroid, stable no hypothyroidism symptoms reported    GERD without esophagitis  - stable continue PPI  Hyperkalemia - slightly improved   CKD stage III -gentle hydration   PVD -hx of critical limb ischemia and severe PVD -outpatient follow up with Dr Allyson Sabal, I have discussed the case with him.   DVT prophylaxis: heparin Code Status: full Family Communication: none at bedside Disposition Plan: med suirg Consults called: Ortho [place] ([facility]) Admission status: discharge today     Consultants:   Ortho   Procedures:   POD#4Left 2nd Toe Amputation  Antimicrobials:   None     Subjective: No new complaints this morning. He remains afebrile. He is eager to be discharged.   Objective: Vitals:   07/26/16 0400 07/26/16 1330 07/26/16 2140 07/27/16 0555  BP: 128/68 102/62 105/68 (!) 128/57  Pulse: 69 65 68 62  Resp: 18 17 17 16   Temp: 98 F (36.7 C) 97.7 F (36.5 C) 98.3 F (36.8 C) 97.5 F (36.4 C)  TempSrc: Oral Oral Oral Oral  SpO2: 94% 98% 98% 98%  Weight:      Height:        Intake/Output Summary (Last 24 hours) at 07/27/16 1149 Last data filed at 07/27/16 0900  Gross per 24 hour  Intake              720 ml  Output              725 ml  Net               -5 ml   Filed Weights   07/22/16 1540  Weight: 78.9 kg (174 lb)    Examination:  General exam: Appears calm and comfortable  Respiratory system: Clear to auscultation. Respiratory effort normal. Cardiovascular system: S1 & S2 heard, RRR. No JVD, murmurs, rubs, gallops or clicks. No pedal edema. Gastrointestinal system: Abdomen is nondistended, soft and nontender. No organomegaly or masses felt. Normal bowel sounds heard. Central nervous system: Alert and oriented. No focal neurological deficits. Extremities: RLE amputated below knee, left foot dressing in place  Skin: No rashes, lesions or ulcers Psychiatry:  Judgement and insight appear normal. Mood & affect appropriate.     Data Reviewed:   CBC:  Recent Labs Lab 07/22/16 1626 07/23/16 0826 07/24/16 0636 07/25/16 0336 07/26/16 0746 07/27/16 0854  WBC 17.2* 14.1* 13.8* 12.4* 12.4* 13.0*  NEUTROABS 10.3*  --   --   --   --   --   HGB 10.6* 9.3* 9.8* 9.6* 10.4* 10.4*  HCT 33.8* 30.0* 31.2* 30.8* 32.7* 33.0*  MCV 72.7* 72.6* 72.7* 73.0* 72.2* 72.4*  PLT 293 258 261 232 268 274   Basic Metabolic Panel:  Recent Labs Lab 07/23/16 0826 07/24/16 0636 07/25/16 0336 07/26/16 0746 07/27/16 0854  NA 135 134* 136 136 135  K 5.0 5.1 5.1 4.5 4.7  CL 108 105 107 104 104  CO2 22 22 20* 24 24  GLUCOSE 90 90 73 87 105*  BUN 27*  24* 22* 25* 23*  CREATININE 1.62* 1.52* 1.61* 1.69* 1.57*  CALCIUM 8.7* 8.9 8.9 9.0 9.4   GFR: Estimated Creatinine Clearance: 46.1 mL/min (by C-G formula based on SCr of 1.57 mg/dL (H)). Liver Function Tests: No results for input(s): AST, ALT, ALKPHOS, BILITOT, PROT, ALBUMIN in the last 168 hours. No results for input(s): LIPASE, AMYLASE in the last 168 hours. No results for input(s): AMMONIA in the last 168 hours. Coagulation Profile: No results for input(s): INR, PROTIME in the last 168 hours. Cardiac Enzymes: No results for input(s): CKTOTAL, CKMB, CKMBINDEX, TROPONINI in the last 168 hours. BNP (last 3 results) No results for input(s): PROBNP in the last 8760 hours. HbA1C: No results for input(s): HGBA1C in the last 72 hours. CBG: No results for input(s): GLUCAP in the last 168 hours. Lipid Profile: No results for input(s): CHOL, HDL, LDLCALC, TRIG, CHOLHDL, LDLDIRECT in the last 72 hours. Thyroid Function Tests: No results for input(s): TSH, T4TOTAL, FREET4, T3FREE, THYROIDAB in the last 72 hours. Anemia Panel: No results for input(s): VITAMINB12, FOLATE, FERRITIN, TIBC, IRON, RETICCTPCT in the last 72 hours. Sepsis Labs: No results for input(s): PROCALCITON, LATICACIDVEN in the last 168 hours.  Recent Results (from the past 240 hour(s))  Culture, Urine     Status: None   Collection Time: 07/24/16 10:03 AM  Result Value Ref Range Status   Specimen Description URINE, CLEAN CATCH  Final   Special Requests NONE  Final   Culture NO GROWTH  Final   Report Status 07/25/2016 FINAL  Final         Radiology Studies: No results found.      Scheduled Meds: . cholecalciferol  1,000 Units Oral Daily  . colchicine  0.6 mg Oral Daily  . heparin  5,000 Units Subcutaneous Q8H  . levothyroxine  12.5 mcg Oral QAC breakfast  . multivitamin with minerals  1 tablet Oral Daily  . pantoprazole  40 mg Oral Daily  . senna  1 tablet Oral BID  . sodium chloride flush  3 mL  Intravenous Q12H  . traZODone  50 mg Oral QHS   Continuous Infusions: . sodium chloride 10 mL/hr (07/23/16 1515)  . lactated ringers 10 mL/hr at 07/23/16 1351     LOS: 4 days    Time spent: 25 mins     Ankit Joline Maxcyhirag Amin, MD [facility] Pager [phone]   If 7PM-7AM, please contact night-coverage [email or web address] Password [email or web address] 07/27/2016, 11:49 AM

## 2016-07-27 NOTE — Discharge Summary (Signed)
Refer to the discharge summary from 07/26/16. Stayed overnight pending insurance approval.

## 2016-07-28 ENCOUNTER — Encounter (HOSPITAL_BASED_OUTPATIENT_CLINIC_OR_DEPARTMENT_OTHER): Payer: Medicare HMO | Attending: Surgery

## 2016-07-28 ENCOUNTER — Other Ambulatory Visit: Payer: Self-pay | Admitting: *Deleted

## 2016-07-28 ENCOUNTER — Telehealth (INDEPENDENT_AMBULATORY_CARE_PROVIDER_SITE_OTHER): Payer: Self-pay | Admitting: *Deleted

## 2016-07-28 ENCOUNTER — Ambulatory Visit: Payer: Medicare HMO | Admitting: Cardiovascular Disease

## 2016-07-28 ENCOUNTER — Telehealth: Payer: Self-pay

## 2016-07-28 DIAGNOSIS — J449 Chronic obstructive pulmonary disease, unspecified: Secondary | ICD-10-CM

## 2016-07-28 MED ORDER — OXYCODONE-ACETAMINOPHEN 10-325 MG PO TABS: ORAL_TABLET | ORAL | 0 refills | Status: DC

## 2016-07-28 NOTE — Clinical Social Work Note (Signed)
Clinical Social Worker facilitated patient discharge including contacting patient family and facility to confirm patient discharge plans.  Clinical information faxed to facility and family agreeable with plan.  CSW arranged ambulance transport via [facility] to [facility] .  RN to call [phone] for report prior to discharge. [facility], [facility].  Clinical Social Worker will sign off for now as social work intervention is no longer needed. Please consult us again if new need arises.  [address]Seini Lannom [place], [place] [phone]

## 2016-07-28 NOTE — Discharge Instructions (Signed)
Gangrene °Introduction °Gangrene is a medical condition caused by a lack of blood supply to an area of your body. Oxygen travels through your blood, so less blood means less oxygen. Without oxygen, your body tissue will start to die. °Gangrene can affect many parts of your body. Gangrene is most common on the skin (external gangrene), but it can also affect internal parts of the body (internal gangrene). °Gangrene requires emergency treatment. °What are the causes? °Any condition that cuts off blood supply can cause gangrene. Common causes include: °· Injuries. °· Blood vessel disease. °· Diabetes. °· Infections. °What increases the risk? °You may be at increased risk for gangrene if you have: °· A serious injury. °· Recent surgery. °· Diabetes. °· A weak body defense system (immune system). °· Narrowing of your arteries (arteriosclerosis). °· An immune system disease that causes your arteries to constrict (Raynaud disease). °· A history of IV drug use. °What are the signs or symptoms? °The signs and symptoms depend on what part of your body is affected. If you have external gangrene, you may have: °· Severe pain followed by loss of feeling. °· Redness and swelling. °· Crackling sounds when you press on a swollen area of skin. °· A wound with bad-smelling drainage. °· Changes in the color of your skin. It may turn red, blue, black, or white. °· Fever and chills. °If you have internal gangrene, you may have: °· Fever and chills. °· Confusion. °· Dizziness. °· Severe pain. °· Rapid heartbeat and breathing. °· Loss of appetite. °· Nausea or vomiting. °How is this diagnosed? °To diagnose gangrene, your health care provider will take your medical history and do a physical exam. Tests may also be done to help in making the diagnosis. These may include: °· X-rays of your blood vessels after injection of a dye (arteriogram). °· Blood tests to look for an infection in your blood. °· Imaging studies such as a CT scan or  MRI. °· Taking a swab from a draining wound to look for bacteria in the laboratory (culture). °· Taking a piece of tissue (biopsy) to look for cell death in the laboratory. °· A surgical procedure to look for gangrene inside your body. °How is this treated? °Treatment for gangrene depends on the cause and the area of your body that is affected. Gangrene is usually treated in the hospital. It is very important to start treatment early before gangrene spreads. Treatment may include the following: °· Medicine. You may get high doses of antibiotic medicine for gangrene caused by infection. The antibiotics may be given directly into a vein through an IV tube. °· Surgery. Surgical options may include: °¨ Debridement. This is surgery to remove dead tissue. You may need to have debridement several times. °¨ Bypass or angioplasty. This surgery improves the blood supply to an affected area. °¨ Amputation. Sometimes it is necessary to remove a body part. °· Oxygen therapy. This involves treatment in a chamber designed to provide high levels of oxygen (hyperbaric oxygen therapy). It can improve the oxygen supply to an affected area. °· Supportive care. This may include: °¨ IV fluids and nutrients. °¨ Pain medicines. °¨ Blood thinners to prevent blood clots. °Follow these instructions at home: °· Take medicines only as directed by your health care provider. °· If you were prescribed an antibiotic medicine, finish it all even if you start to feel better. °· Clean any cuts or scratches with an antiseptic. °· Cover any open wounds. °· Check wounds for signs   of infection. Watch for redness or swelling. °· Do not use any tobacco products, including cigarettes, chewing tobacco, or electronic cigarettes. If you need help quitting, ask your health care provider. °· Limit alcohol intake to no more than 1 drink per day for nonpregnant women and 2 drinks per day for men. One drink equals 12 ounces of beer, 5 ounces of wine, or 1½ ounces of  hard liquor. °· Eat a healthy diet and maintain a healthy weight. °· Get some exercise on most days of the week. Ask your health care provider to suggest activities that are safe for you. °· If you have diabetes or another blood vessel disease, check your feet often for signs of injury or infection. °· After any surgery you have, follow your health care provider's instructions carefully. °· Keep all follow-up visits as directed by your health care provider. This is important. °Contact a health care provider if: °· You have a wound or sore that is not healing. °· You have color changes (white, red, blue, or black) in an area of your skin. °· You have a wound or sore with smelly drainage. °Get help right away if: °· You have rapidly worsening pain at the site of a skin infection or wound. °· You lose sensation at the site of a skin infection or wound. °· You have unexplained: °¨ Fever. °¨ Chills. °¨ Confusion. °¨ Fainting. °This information is not intended to replace advice given to you by your health care provider. Make sure you discuss any questions you have with your health care provider. °Document Released: 05/06/2004 Document Revised: 12/11/2015 Document Reviewed: 08/29/2013 °© 2017 Elsevier ° °

## 2016-07-28 NOTE — Telephone Encounter (Signed)
Wound care nurse from [facility] called stating pt was just admitted to their facility and she needs to know if the bandage can be removed and instructions for wound care. Also needs to know if pt needs follow up visit.

## 2016-07-28 NOTE — Telephone Encounter (Signed)
[facility] [phone] Fax: [phone]  

## 2016-07-28 NOTE — Discharge Summary (Addendum)
Physician Discharge Summary  Caleb DuboisJerald J Eichler ZOX:[id number] DOB: 1940/10/31 DOA: 07/22/2016  PCP: [name], [name], MD  Admit date: 07/22/2016 Discharge date: 07/28/2016  Admitted From: SNF Disposition:  SNF-  [facility]   Recommendations for Outpatient Follow-up:  1. Follow up with PCP in 1-2 weeks 2. Follow up with Dr Allyson SabalBerry on Jan 10th at 9am  3. Follow up with Orthopedic Dr Lajoyce Cornersuda in 2 weeks as well   Home Health:NO Equipment/Devices: None   Discharge Condition:Stable CODE STATUS:FULL Diet recommendation: Prehospitalization diet   Brief/Interim Summary: 76 y.o. male with medical history significant of tobacco abuse, hypothyroidism, gerd, copd who presents with a 402 month old toe wound. He was noted to have osteomyelitis of the toe on the imaging. Dr Lajoyce Cornersuda was consulted who performed an amputatin of the toe which he tolerated well. Post Op he did well off Antibiotics. He was evaluated by PT who recommended SNF therefore SW was consulted who made the arrangement.  Today he has reached maximum benefit from hospital stay. He is stable to be discharged.  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  - received colchine while in the hospital   UTI - done with levaquin course; no growth on micro  COPD (chronic obstructive pulmonary disease) (HCC) - stable, continue home medication regimen  Hypothyroidism (acquired) - continue synthroid, stable no hypothyroidism symptoms reported  GERD without esophagitis - stable continue PPI  Hyperkalemia - resolved   CKD stage III - improved with hydration   PVD - hx of critical limb ischemia and  severe PVD - outpatient follow up with Dr Allyson SabalBerry  Discharge Instructions  Discharge Instructions    Diet - low sodium heart healthy    Complete by:  As directed    Increase activity slowly    Complete by:  As directed      Allergies as of 07/28/2016   No Known Allergies     Medication List    STOP taking these medications   oxyCODONE-acetaminophen 10-325 MG tablet Commonly known as:  PERCOCET   saccharomyces boulardii 250 MG capsule Commonly known as:  FLORASTOR     TAKE these medications   albuterol 108 (90 Base) MCG/ACT inhaler Commonly known as:  PROVENTIL HFA;VENTOLIN HFA Inhale 2 puffs into the lungs 2 (two) times daily. Take every daily per niece   allopurinol 100 MG tablet Commonly known as:  ZYLOPRIM Take 100 mg by mouth daily.   cholecalciferol 1000 units tablet Commonly known as:  VITAMIN D Take 1,000 Units by mouth daily. Reported on 11/06/2015   famotidine 20 MG tablet Commonly known as:  PEPCID Take 20 mg by mouth daily. For GERD.   levothyroxine 25 MCG tablet Commonly known as:  SYNTHROID, LEVOTHROID Take 12.5 mcg by mouth daily before breakfast.   loperamide 2 MG tablet Commonly known as:  IMODIUM A-D Give 1 - 2 tablets ( 2mg  - 4mg ) after initial dose following  each loose stool following. Do not give if < 3 stools or Dx c.diff or norovirus. So do not exceed > 16 mg/24 hr period.   meloxicam 7.5 MG tablet Commonly known  as:  MOBIC Take 7.5 mg by mouth daily.   methocarbamol 500 MG tablet Commonly known as:  ROBAXIN Take 500 mg by mouth every 6 (six) hours as needed for muscle spasms.   multivitamin with minerals tablet Take 1 tablet by mouth daily.   omeprazole 20 MG capsule Commonly known as:  PRILOSEC Take 20 mg by mouth daily.   oxyCODONE 5 MG immediate release tablet Commonly known as:  Oxy IR/ROXICODONE Take 1 tablet (5 mg total) by mouth every 4 (four) hours as needed for moderate pain or severe pain.   senna 8.6 MG Tabs  tablet Commonly known as:  SENOKOT Take 1 tablet (8.6 mg total) by mouth 2 (two) times daily.   traZODone 50 MG tablet Commonly known as:  DESYREL Take 50 mg by mouth at bedtime.          Contact information for follow-up providers    Nadara Mustard, MD Follow up in 2 week(s).   Specialty:  Orthopedic Surgery Contact information: [address] [place] [address] [phone]        Nanetta Batty, MD. Go on 07/28/2016.   Specialties:  Cardiology, Radiology Why:  Jan 10th at [facility] information: [address] [place] [address] [phone]            Contact information for after-discharge care    Destination    [facility] Follow up.   Specialty:  Skilled Nursing Facility Contact information: [address] [place] [address] [phone]                 No Known Allergies  Consultations:  Orthopedics    Procedures/Studies: Dg Foot 2 Views Left  Result Date: 07/22/2016 CLINICAL DATA:  Open wound on the second toe, evaluate for osteomyelitis EXAM: LEFT FOOT - 2 VIEW COMPARISON:  None. FINDINGS: There is air in the soft tissues of the distal left second toe which could be due to an open wound, but infection cannot be excluded. There does appear to be some loss of the cortical margin of the tuft of the distal phalanx along its lateral aspect and osteomyelitis cannot be excluded. The left second toe is not seen on the lateral view in detail. Tarsal metatarsal alignment is normal. Joint spaces appear normal. No erosion is seen. IMPRESSION: Cannot exclude osteomyelitis involving the distal phalanx of the left second toe. Electronically Signed   By: Dwyane Dee M.D.   On: 07/22/2016 17:02    Subjective:   Discharge Exam: Vitals:   07/27/16 0555 07/27/16 2054  BP: (!) 128/57 130/64  Pulse: 62 (!) 59  Resp: 16 16  Temp: 97.5 F (36.4 C) 98.1 F (36.7 C)   Vitals:   07/26/16 1330  07/26/16 2140 07/27/16 0555 07/27/16 2054  BP: 102/62 105/68 (!) 128/57 130/64  Pulse: 65 68 62 (!) 59  Resp: 17 17 16 16   Temp: 97.7 F (36.5 C) 98.3 F (36.8 C) 97.5 F (36.4 C) 98.1 F (36.7 C)  TempSrc: Oral Oral Oral Oral  SpO2: 98% 98% 98% 100%  Weight:      Height:        General: Pt is alert, awake, not in acute distress Cardiovascular: RRR, S1/S2 +, no rubs, no gallops Respiratory: CTA bilaterally, no wheezing, no rhonchi Abdominal: Soft, NT, ND, bowel sounds + Extremities: no edema, no cyanosis   The results of significant diagnostics from this hospitalization (including imaging, microbiology, ancillary and laboratory) are listed  below for reference.     Microbiology: Recent Results (from the past 240 hour(s))  Culture, Urine     Status: None   Collection Time: 07/24/16 10:03 AM  Result Value Ref Range Status   Specimen Description URINE, CLEAN CATCH  Final   Special Requests NONE  Final   Culture NO GROWTH  Final   Report Status 07/25/2016 FINAL  Final     Labs: Basic Metabolic Panel:  Recent Labs Lab 07/23/16 0826 07/24/16 0636 07/25/16 0336 07/26/16 0746 07/27/16 0854  NA 135 134* 136 136 135  K 5.0 5.1 5.1 4.5 4.7  CL 108 105 107 104 104  CO2 22 22 20* 24 24  GLUCOSE 90 90 73 87 105*  BUN 27* 24* 22* 25* 23*  CREATININE 1.62* 1.52* 1.61* 1.69* 1.57*  CALCIUM 8.7* 8.9 8.9 9.0 9.4   CBC:  Recent Labs Lab 07/22/16 1626 07/23/16 0826 07/24/16 0636 07/25/16 0336 07/26/16 0746 07/27/16 0854  WBC 17.2* 14.1* 13.8* 12.4* 12.4* 13.0*  NEUTROABS 10.3*  --   --   --   --   --   HGB 10.6* 9.3* 9.8* 9.6* 10.4* 10.4*  HCT 33.8* 30.0* 31.2* 30.8* 32.7* 33.0*  MCV 72.7* 72.6* 72.7* 73.0* 72.2* 72.4*  PLT 293 258 261 232 268 274   Urinalysis    Component Value Date/Time   COLORURINE YELLOW 07/22/2016 1631   APPEARANCEUR CLOUDY (A) 07/22/2016 1631   LABSPEC 1.025 07/22/2016 1631   PHURINE 5.5 07/22/2016 1631   GLUCOSEU NEGATIVE 07/22/2016  1631   HGBUR MODERATE (A) 07/22/2016 1631   BILIRUBINUR NEGATIVE 07/22/2016 1631   KETONESUR NEGATIVE 07/22/2016 1631   PROTEINUR 30 (A) 07/22/2016 1631   UROBILINOGEN >8.0 (H) 05/14/2012 1805   NITRITE NEGATIVE 07/22/2016 1631   LEUKOCYTESUR LARGE (A) 07/22/2016 1631   Microbiology Recent Results (from the past 240 hour(s))  Culture, Urine     Status: None   Collection Time: 07/24/16 10:03 AM  Result Value Ref Range Status   Specimen Description URINE, CLEAN CATCH  Final   Special Requests NONE  Final   Culture NO GROWTH  Final   Report Status 07/25/2016 FINAL  Final   Time coordinating discharge: 30 minutes  SIGNED:  Debbora Presto, MD  [facility] 07/28/2016, 8:55 AM Pager [phone]  If 7PM-7AM, please contact night-coverage [email or web address] Password [email or web address]

## 2016-07-28 NOTE — Anesthesia Postprocedure Evaluation (Addendum)
Anesthesia Post Note  Patient: Caleb DuboisJerald J Hays  Procedure(s) Performed: Procedure(s) (LRB): Left 2nd Toe Amputation (Left)  Patient location during evaluation: PACU Anesthesia Type: Regional Level of consciousness: awake and alert Pain management: pain level controlled Vital Signs Assessment: post-procedure vital signs reviewed and stable Respiratory status: spontaneous breathing, nonlabored ventilation, respiratory function stable and patient connected to nasal cannula oxygen Cardiovascular status: stable and blood pressure returned to baseline Anesthetic complications: no       Last Vitals:  Vitals:   07/27/16 0555 07/27/16 2054  BP: (!) 128/57 130/64  Pulse: 62 (!) 59  Resp: 16 16  Temp: 36.4 C 36.7 C    Last Pain:  Vitals:   07/28/16 0445  TempSrc:   PainSc: Asleep                 [name]

## 2016-07-28 NOTE — Telephone Encounter (Signed)
I called and advised [place] to have surgical dressing remain intact until first post op unless bandage is soiled. If soiled it can be changed with dry dressing daily. Their scheduler will call back office to schedule first post op appt.

## 2016-07-28 NOTE — Clinical Social Work Note (Signed)
CSW has received insurance authorization at this time per [facility]. Authorization number: [id number]. [facility] with [facility]la [facility]. [facility].  [address]Lydiann Bonifas [place], [place] [phone]

## 2016-07-28 NOTE — Telephone Encounter (Signed)
Possible re-admission to facility. This is a patient you were seeing at [facility] . [facility] is needed if patient was re-admitted to facility upon discharge. Hospital discharge from [facility] on 07/28/16.

## 2016-07-29 ENCOUNTER — Encounter: Payer: Self-pay | Admitting: Internal Medicine

## 2016-07-29 ENCOUNTER — Non-Acute Institutional Stay (SKILLED_NURSING_FACILITY): Payer: Medicare HMO | Admitting: Internal Medicine

## 2016-07-29 DIAGNOSIS — K5901 Slow transit constipation: Secondary | ICD-10-CM | POA: Diagnosis not present

## 2016-07-29 DIAGNOSIS — R2681 Unsteadiness on feet: Secondary | ICD-10-CM

## 2016-07-29 DIAGNOSIS — E039 Hypothyroidism, unspecified: Secondary | ICD-10-CM | POA: Diagnosis not present

## 2016-07-29 DIAGNOSIS — J449 Chronic obstructive pulmonary disease, unspecified: Secondary | ICD-10-CM

## 2016-07-29 DIAGNOSIS — D62 Acute posthemorrhagic anemia: Secondary | ICD-10-CM

## 2016-07-29 DIAGNOSIS — D72828 Other elevated white blood cell count: Secondary | ICD-10-CM | POA: Diagnosis not present

## 2016-07-29 DIAGNOSIS — N183 Chronic kidney disease, stage 3 unspecified: Secondary | ICD-10-CM

## 2016-07-29 DIAGNOSIS — M869 Osteomyelitis, unspecified: Secondary | ICD-10-CM

## 2016-07-29 DIAGNOSIS — K219 Gastro-esophageal reflux disease without esophagitis: Secondary | ICD-10-CM

## 2016-07-29 NOTE — Progress Notes (Signed)
LOCATION: [facility]  PCP: Oneal Grout, MD   Code Status: Full Code  Goals of care: Advanced Directive information Advanced Directives 07/22/2016  Does Patient Have a Medical Advance Directive? No  Type of Advance Directive -  Does patient want to make changes to medical advance directive? -  Copy of Healthcare Power of Attorney in Chart? -  Would patient like information on creating a medical advance directive? -       Extended Emergency Contact Information Primary Emergency Contact: [name],[name] Address: [address]          [place], [place] [address] [place] of [place] Home Phone: [phone] Mobile Phone: [phone] Relation: Niece Secondary Emergency Contact: [name] of [place] Home Phone: [phone] Relation: Friend   No Known Allergies  Chief Complaint  Patient presents with  . Readmit To SNF    Readmission Visit      HPI:  Patient is a 76 y.o. male seen today for Long-term care post hospital admission from 07/22/2016-07/28/2016 with left second toe wound. He was diagnosed with osteomyelitis and underwent left second toe amputation. He was on IV antibiotics which were discontinued after amputation. He was also treated for urinary tract infection this hospital admission. He has medical history of COPD, hypothyroidism, CKD stage III, peripheral vascular disease among others. He is seen in his room today.  Review of Systems:  Constitutional: Negative for fever, chills, diaphoresis. Feels weak and tired.  HENT: Negative for headache, congestion, nasal discharge, sore throat, difficulty swallowing.   Eyes: Negative for blurred vision, double vision and discharge.  Respiratory: Negative for cough, shortness of breath and wheezing.   Cardiovascular: Negative for chest pain, palpitations, leg swelling.  Gastrointestinal: Negative for heartburn, nausea, vomiting, abdominal pain. Positive for poor appetite. Last bowel movement was  several days ago.  Genitourinary: Negative for dysuria and flank pain.  Musculoskeletal: Negative for back pain, fall in the facility.  positive for pain in his left foot Skin: Negative for itching, rash.  Neurological: Negative for dizziness. Psychiatric/Behavioral: Negative for depression   Past Medical History:  Diagnosis Date  . Adult failure to thrive    /notes 06/23/2015  . Anemia   . Arthritis    "hands" (06/23/2015)  . COPD (chronic obstructive pulmonary disease) (HCC)    LONG TIME SMOKER  . Critical lower limb ischemia   . 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 Depression    "periods of depression" (06/23/2015)  . Gout   . Physical deconditioning   . Pyelonephritis 06/23/2015  . Status post foot surgery    right fifth toe amputation by Dr. Lajoyce Corners    Past Surgical History:  Procedure Laterality Date  . AMPUTATION Right 03/01/2014   Procedure: AMPUTATION RAY;  Surgeon: Nadara Mustard, MD;  Location: [facility] OR;  Service: Orthopedics;  Laterality: Right;  Right Foot 5th Ray Amputation  . AMPUTATION Right 04/26/2014   Procedure: Right Foot 4th Ray Amputation;  Surgeon: Nadara Mustard, MD;  Location: [facility] OR;  Service: Orthopedics;  Laterality: Right;  . AMPUTATION Right 10/01/2015   Procedure: Right Below Knee Amputation;  Surgeon: Nadara Mustard, MD;  Location: [facility] OR;  Service: Orthopedics;  Laterality: Right;  . AMPUTATION Left 07/23/2016   Procedure: Left 2nd Toe Amputation;  Surgeon: Nadara Mustard, MD;  Location: [facility] OR;  Service: Orthopedics;  Laterality: Left;  . ANKLE ARTHROSCOPY Right 08/13/2015   Procedure: ANKLE ARTHROSCOPY;  Surgeon: Berna Spare  Kandis Mannan, MD;  Location: [facility] OR;  Service: Orthopedics;  Laterality: Right;  . CATARACT EXTRACTION W/ INTRAOCULAR LENS  IMPLANT, BILATERAL Bilateral   . INGUINAL HERNIA REPAIR Left   . KNEE CARTILAGE SURGERY Left 1960's   football injury  . KNEE LIGAMENT RECONSTRUCTION Left 1960's  . LOWER EXTREMITY ANGIOGRAM N/A  05/27/2014   Procedure: LOWER EXTREMITY ANGIOGRAM;  Surgeon: Runell Gess, MD;  Location: [facility] CATH LAB;  Service: Cardiovascular;  Laterality: N/A;  . STUMP REVISION Right 12/05/2015   Procedure: Revision Right Below Knee Amputation;  Surgeon: Nadara Mustard, MD;  Location: [facility] OR;  Service: Orthopedics;  Laterality: Right;   Social History:   reports that he has quit smoking. His smoking use included Cigarettes. He has a 14.25 pack-year smoking history. He has never used smokeless tobacco. He reports that he drinks alcohol. He reports that he does not use drugs.  Family History  Problem Relation Age of Onset  . Heart failure Father   . Heart attack Father   . Heart failure Brother     Medications: Allergies as of 07/29/2016   No Known Allergies     Medication List       Accurate as of 07/29/16 11:35 AM. Always use your most recent med list.          albuterol 108 (90 Base) MCG/ACT inhaler Commonly known as:  PROVENTIL HFA;VENTOLIN HFA Inhale 2 puffs into the lungs 2 (two) times daily. Take every daily per niece   allopurinol 100 MG tablet Commonly known as:  ZYLOPRIM Take 100 mg by mouth daily.   cholecalciferol 1000 units tablet Commonly known as:  VITAMIN D Take 1,000 Units by mouth daily. Reported on 11/06/2015   famotidine 20 MG tablet Commonly known as:  PEPCID Take 20 mg by mouth daily. For GERD.   levothyroxine 25 MCG tablet Commonly known as:  SYNTHROID, LEVOTHROID Take 25 mcg by mouth daily before breakfast.   loperamide 2 MG tablet Commonly known as:  IMODIUM A-D Give 1 - 2 tablets ( 2mg  - 4mg ) after initial dose following  each loose stool following. Do not give if < 3 stools or Dx c.diff or norovirus. So do not exceed > 16 mg/24 hr period.   meloxicam 7.5 MG tablet Commonly known as:  MOBIC Take 7.5 mg by mouth daily.   methocarbamol 500 MG tablet Commonly known as:  ROBAXIN Take 500 mg by mouth every 6 (six) hours as needed for muscle spasms.     multivitamin with minerals tablet Take 1 tablet by mouth daily.   omeprazole 20 MG capsule Commonly known as:  PRILOSEC Take 20 mg by mouth daily.   oxyCODONE 5 MG immediate release tablet Commonly known as:  Oxy IR/ROXICODONE Take 1 tablet (5 mg total) by mouth every 4 (four) hours as needed for moderate pain or severe pain.   senna 8.6 MG Tabs tablet Commonly known as:  SENOKOT Take 1 tablet (8.6 mg total) by mouth 2 (two) times daily.   traZODone 50 MG tablet Commonly known as:  DESYREL Take 50 mg by mouth at bedtime.       Immunizations: Immunization History  Administered Date(s) Administered  . Influenza,inj,Quad PF,36+ Mos 06/25/2015  . PPD Test 10/03/2015, 10/17/2015  . Pneumococcal Polysaccharide-23 06/25/2015     Physical Exam:  Vitals:   07/29/16 1129  BP: 134/90  Pulse: 92  Resp: 20  Temp: 97.9 F (36.6 C)  TempSrc: Oral  SpO2: 99%  Weight: 174  lb (78.9 kg)  Height: 6\' 4"  (1.93 m)   Body mass index is 21.18 kg/m.  General- elderly male, thin built, in no acute distress Head- normocephalic, atraumatic Nose- no maxillary or frontal sinus tenderness, no nasal discharge Throat- moist mucus membrane, edentulous Eyes- PERRLA, EOMI, no pallor, no icterus, no discharge, normal conjunctiva, normal sclera Neck- no cervical lymphadenopathy Cardiovascular- normal s1,s2, no murmur Respiratory- bilateral clear to auscultation, no wheeze, no rhonchi, no crackles, no use of accessory muscles Abdomen- bowel sounds present, soft, non tender Musculoskeletal- right below knee amputation, left second toe amputated, left foot with dressing in place and dressing is clean and dry, able to move his extremities  Neurological- alert and oriented to person, place and time Skin- warm and dry Psychiatry- normal mood and affect    Labs reviewed: Basic Metabolic Panel:  Recent Labs  40/98/1100/02/02 0336 07/26/16 0746 07/27/16 0854  NA 136 136 135  K 5.1 4.5 4.7  CL 107  104 104  CO2 20* 24 24  GLUCOSE 73 87 105*  BUN 22* 25* 23*  CREATININE 1.61* 1.69* 1.57*  CALCIUM 8.9 9.0 9.4   Liver Function Tests:  Recent Labs  08/05/15 1505 08/13/15 1232 10/01/15 1104 10/07/15 04/12/16  AST 23 14* 17 10* 9*  ALT 11* 12* 15* 5* 5*  ALKPHOS 67 77 113 85 70  BILITOT 0.4 0.5 0.7  --   --   PROT 6.2* 5.7* 5.8*  --   --   ALBUMIN 3.2* 2.4* 2.5*  --   --    No results for input(s): LIPASE, AMYLASE in the last 8760 hours. No results for input(s): AMMONIA in the last 8760 hours. CBC:  Recent Labs  08/13/15 1232  04/12/16 07/22/16 1626  07/25/16 0336 07/26/16 0746 07/27/16 0854  WBC 12.3*  < > 8.0 17.2*  < > 12.4* 12.4* 13.0*  NEUTROABS 9.1*  --  3 10.3*  --   --   --   --   HGB 9.9*  < > 10.3* 10.6*  < > 9.6* 10.4* 10.4*  HCT 31.4*  < > 34* 33.8*  < > 30.8* 32.7* 33.0*  MCV 74.9*  < >  --  72.7*  < > 73.0* 72.2* 72.4*  PLT 304  < > 321 293  < > 232 268 274  < > = values in this interval not displayed. Cardiac Enzymes:  Recent Labs  08/10/15 0820  CKTOTAL 21*   BNP: Invalid input(s): POCBNP CBG:  Recent Labs  12/05/15 1414  GLUCAP 84    Radiological Exams: Dg Foot 2 Views Left  Result Date: 07/22/2016 CLINICAL DATA:  Open wound on the second toe, evaluate for osteomyelitis EXAM: LEFT FOOT - 2 VIEW COMPARISON:  None. FINDINGS: There is air in the soft tissues of the distal left second toe which could be due to an open wound, but infection cannot be excluded. There does appear to be some loss of the cortical margin of the tuft of the distal phalanx along its lateral aspect and osteomyelitis cannot be excluded. The left second toe is not seen on the lateral view in detail. Tarsal metatarsal alignment is normal. Joint spaces appear normal. No erosion is seen. IMPRESSION: Cannot exclude osteomyelitis involving the distal phalanx of the left second toe. Electronically Signed   By: Dwyane DeePaul  Barry M.D.   On: 07/22/2016 17:02     Assessment/Plan  Unsteady gait Status post left second toe amputation. Will need for him to work with physical therapy  team to help with regaining his strength and balance.  Left second toe osteomyelitis Status post second toe amputation. Continue wound dressing. Will need to follow with orthopedic Dr. Lajoyce Corners and vascular Dr. Marina Goodell. Monitor for signs of infection. Currently on meloxicam 7.5 mg daily and oxycodone 5 mg 1 tablet every 4 hours as needed for pain. Also on Robaxin 500 mg every 6 hours as needed for muscle spasm. Get PMR consult.  Acute blood loss anemia Post surgery. Monitor CBC.  Leukocytosis Recently treated for osteomyelitis. Afebrile. Monitor WBC and temperature curve.  CKD stage III Monitor BMP  Constipation Currently on senna twice a day. Add MiraLAX daily for now and monitor. Hydration encouraged.  Hypothyroidism Continue levothyroxine Lab Results  Component Value Date   TSH 3.56 03/15/2016   COPD No signs of exacerbation. Breathing has been stable. Continue Proventil twice a day.  Gastroesophageal reflux disease Continue omeprazole 20 mg daily and famotidine 20 mg daily.    Goals of care: Long-term care   Labs/tests ordered: CBC, BMP 15th of January 2018  Family/ staff Communication: reviewed care plan with patient and nursing supervisor    Oneal Grout, MD Internal Medicine [facility] [address] [place], [place] [address] Cell Phone (Monday-Friday 8 am - 5 pm): [phone] On Call: [phone] and follow prompts after 5 pm and on weekends Office Phone: [phone] Office Fax: [phone]

## 2016-08-02 ENCOUNTER — Ambulatory Visit (INDEPENDENT_AMBULATORY_CARE_PROVIDER_SITE_OTHER): Payer: Medicare HMO | Admitting: Cardiology

## 2016-08-02 ENCOUNTER — Encounter: Payer: Self-pay | Admitting: Cardiology

## 2016-08-02 ENCOUNTER — Other Ambulatory Visit: Payer: Self-pay | Admitting: *Deleted

## 2016-08-02 DIAGNOSIS — I739 Peripheral vascular disease, unspecified: Secondary | ICD-10-CM

## 2016-08-02 DIAGNOSIS — J438 Other emphysema: Secondary | ICD-10-CM | POA: Diagnosis not present

## 2016-08-02 DIAGNOSIS — N183 Chronic kidney disease, stage 3 unspecified: Secondary | ICD-10-CM

## 2016-08-02 LAB — BASIC METABOLIC PANEL
BUN: 30 mg/dL — AB (ref 4–21)
CREATININE: 1.6 mg/dL — AB (ref 0.6–1.3)
GLUCOSE: 79 mg/dL
POTASSIUM: 5.2 mmol/L (ref 3.4–5.3)
SODIUM: 140 mmol/L (ref 137–147)

## 2016-08-02 LAB — CBC AND DIFFERENTIAL
HCT: 33 % — AB (ref 41–53)
Hemoglobin: 10.3 g/dL — AB (ref 13.5–17.5)
Platelets: 321 10*3/uL (ref 150–399)
WBC: 12.1 10*3/mL

## 2016-08-02 MED ORDER — OXYCODONE HCL 5 MG PO TABS: ORAL_TABLET | ORAL | 0 refills | Status: DC

## 2016-08-02 NOTE — Patient Instructions (Signed)
Corine ShelterLuke Kilroy, PA-C has recommended making the following medication changes: 1. START Aspirin 81 mg daily  Franky MachoLuke recommends that you follow-up with Dr Allyson SabalBerry as needed.

## 2016-08-02 NOTE — Assessment & Plan Note (Addendum)
Multiple toe amputations (Dr Lajoyce Cornersuda), know bilateral SFA occlusion down below knees bilateral. Last surgery was 07/22/16

## 2016-08-02 NOTE — Telephone Encounter (Signed)
[facility] [phone] Fax: [phone]  

## 2016-08-02 NOTE — Assessment & Plan Note (Signed)
Not smoking for over a year

## 2016-08-02 NOTE — Progress Notes (Signed)
08/02/2016 Caleb Hays   [birth year]  [id number]  Primary Physician Caleb Grout, MD Primary Cardiologist: Caleb Hays  HPI:  76 year old thin appearing single Caucasian male with no children who was referred to Caleb Hays in 2015 for peripheral vascular evaluation. He is retired Corporate investment banker. His cardiovascular risk factors are notable for 60 pack years having quit last year. He also quit taking alcohol 3 years ago at which time he was drinking 6-8 beers a day. He has never had a heart attack or stroke, denies chest pain or shortness of breath. He has had several toe amputations in the past. In  March 2017 he had a Rt BKA.  He was just discharged to [facility] after he had  Lt toe amputation 07/22/16. He is seen in the office by me as he could not make a previous appointment with Caleb Hays 07/28/16 secondary to being hospitalized. It's not specifically stated in the notes, but I get the impression he was sent sent to Caleb Hays to see if there was any percutaneous option for LLE revascularization to promote healing in his Lt foot.   I reviewed Caleb Hays's case with Caleb Hays on the phone today.  An angiogram on 05/27/14 showed  occluded superficial femoral arteries from the origin down to the below-the-knee popliteal artery with reconstitution of the anterior tibial and peroneal arteries. He was seen then in consultation by Caleb. Darrick Hays who arranged right fem-tib bypass grafting, however ultimately this never came to pass as his wounds healed with transdermal nitroglycerin patches and conservative therapy.  he does complain of claudication.   Current Outpatient Prescriptions  Medication Sig Dispense Refill  . albuterol (PROVENTIL HFA;VENTOLIN HFA) 108 (90 BASE) MCG/ACT inhaler Inhale 2 puffs into the lungs 2 (two) times daily. Take every daily per niece    . allopurinol (ZYLOPRIM) 100 MG tablet Take 100 mg by mouth daily.     . cholecalciferol (VITAMIN D) 1000 units tablet Take 1,000  Units by mouth daily. Reported on 11/06/2015    . famotidine (PEPCID) 20 MG tablet Take 20 mg by mouth daily. For GERD.    Marland Kitchen levothyroxine (SYNTHROID, LEVOTHROID) 25 MCG tablet Take 25 mcg by mouth daily before breakfast.     . loperamide (IMODIUM A-D) 2 MG tablet Give 1 - 2 tablets ( 2mg  - 4mg ) after initial dose following  each loose stool following. Do not give if < 3 stools or Dx c.diff or norovirus. So do not exceed > 16 mg/24 hr period.    . meloxicam (MOBIC) 7.5 MG tablet Take 7.5 mg by mouth daily.     . methocarbamol (ROBAXIN) 500 MG tablet Take 500 mg by mouth every 6 (six) hours as needed for muscle spasms.    . Multiple Vitamins-Minerals (MULTIVITAMIN WITH MINERALS) tablet Take 1 tablet by mouth daily.    Marland Kitchen omeprazole (PRILOSEC) 20 MG capsule Take 20 mg by mouth daily.     Marland Kitchen oxyCODONE (OXY IR/ROXICODONE) 5 MG immediate release tablet Take one tablet by mouth three times daily for pain; Take one tablet by mouth every 4 hours as needed for moderate or severe pain 270 tablet 0  . senna (SENOKOT) 8.6 MG TABS tablet Take 1 tablet (8.6 mg total) by mouth 2 (two) times daily. 30 each 0  . traZODone (DESYREL) 50 MG tablet Take 50 mg by mouth at bedtime.      No current facility-administered medications for this visit.     No Known Allergies  Social History   Social History  . Marital status: Single    Spouse name: N/A  . Number of children: N/A  . Years of education: N/A   Occupational History  . Not on file.   Social History Main Topics  . Smoking status: Former Smoker    Packs/day: 0.25    Years: 57.00    Types: Cigarettes  . Smokeless tobacco: Never Used  . Alcohol use Yes     Comment: 06/23/2015 "was drinking alot of beer; quit in 2015" 09/30/15 - no longer drinks  . Drug use: No  . Sexual activity: No   Other Topics Concern  . Not on file   Social History Narrative  . No narrative on file     Review of Systems: General: negative for chills, fever, night sweats or  weight changes.  Cardiovascular: negative for chest pain, dyspnea on exertion, edema, orthopnea, palpitations, paroxysmal nocturnal dyspnea or shortness of breath Dermatological: negative for rash Respiratory: negative for cough or wheezing Urologic: negative for hematuria Abdominal: negative for nausea, vomiting, diarrhea, bright red blood per rectum, melena, or hematemesis Neurologic: negative for visual changes, syncope, or dizziness All other systems reviewed and are otherwise negative except as noted above.    Blood pressure 109/65, pulse 60, height 6\' 4"  (1.93 m), weight 158 lb (71.7 kg), SpO2 96 %.  General appearance: alert, cooperative, cachectic, no distress and in a wheel chair Neck: no carotid bruit and no JVD Lungs: decreased BS c/w COPD Heart: regular rate and rhythm Extremities: Rt BKA, Lt foot dressed Skin: pale cool dry Neurologic: Grossly normal    ASSESSMENT AND PLAN:   PVD (peripheral vascular disease) (HCC) Multiple toe amputations (Caleb Caleb Hays), know bilateral SFA occlusion down below knees bilateral. Last surgery was 07/22/16  COPD (chronic obstructive pulmonary disease) (HCC) Not smoking for over a year  CKD (chronic kidney disease) stage 3, GFR 30-59 ml/min .   PLAN  Discussed with Caleb Caleb SabalBerry. He does not feel there is any percutaneous option. He suggested aggressive wound care for 4 weeks. If his Lt foot does not heal he would suggest evaluation by Caleb Caleb Hays to see if he is a candidate for Rt-Lt fem/ tib bypass. The pt had a Myoview in 2015 that suggested inferior scar but no ischemia. Echo showed LVH with normal LVF. He has not any symptoms to suggest angina. Will discuss cardiac clearance with Caleb Caleb SabalBerry if it comes to Fem-tib bypass. I did add an ASA 81 mg daily with suspected CAD by Myoview. I'll send a copy of this note to Caleb Caleb Hays and Caleb Caleb Hays.  Corine ShelterLuke Caleb Strahm PA-C 08/02/2016 12:24 PM

## 2016-08-06 ENCOUNTER — Encounter (INDEPENDENT_AMBULATORY_CARE_PROVIDER_SITE_OTHER): Payer: Self-pay | Admitting: Orthopedic Surgery

## 2016-08-06 ENCOUNTER — Ambulatory Visit (INDEPENDENT_AMBULATORY_CARE_PROVIDER_SITE_OTHER): Payer: Medicare HMO | Admitting: Orthopedic Surgery

## 2016-08-06 DIAGNOSIS — Z89422 Acquired absence of other left toe(s): Secondary | ICD-10-CM | POA: Insufficient documentation

## 2016-08-06 NOTE — Progress Notes (Signed)
Office Visit Note   Patient: Caleb Hays           Date of Birth: 03-01-1941           MRN: [id number] Visit Date: 08/06/2016              Requested by: Oneal Grout, MD [address] [place], [place] [address] PCP: Oneal Grout, MD   Assessment & Plan: Visit Diagnoses: No diagnosis found.  Plan: Nonweight bearing on left foot x 1 more week. Sutures harvested today. Will follow up in office in 1 week. Hope to advance weight bearing at that time. Dry dressings to incision daily.   Follow-Up Instructions: No Follow-up on file.   Orders:  No orders of the defined types were placed in this encounter.  No orders of the defined types were placed in this encounter.     Procedures: No procedures performed   Clinical Data: No additional findings.   Subjective: Chief Complaint  Patient presents with  . Left Foot - Routine Post Op  . Follow-up    Patient is here for 2 weeks postop of left 2nd toe amputation.  Patient is currently at [facility].  Stitches are still intact, incision looks good.  No drainage.April [place], [place]  has been working with PT for upper body strengthening. Is nonweight bearing on left lower extremity.   Review of Systems  Constitutional: Negative for chills and fever.     Objective: Vital Signs: Ht 6\' 4"  (1.93 m)   Wt 158 lb (71.7 kg)   BMI 19.23 kg/m   Physical Exam  Ortho Exam Incision is well approximated with sutures. Healing well. No drainage. No surrounding erythema or odor. Minimal swelling to left foot.   Specialty Comments:  No specialty comments available.  Imaging: No results found.   PMFS History: Patient Active Problem List   Diagnosis Date Noted  . Gangrene of toe of left foot (HCC)   . Toe infection 07/22/2016  . Osteomyelitis (HCC) 07/22/2016  . Skin ulcer of second toe of left foot (HCC) 07/01/2016  . Edema 05/05/2016  . Phantom pain 03/08/2016  . 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 Gout 03/08/2016  . Hypothyroidism (acquired) 03/08/2016  . CKD (chronic kidney disease) stage 3, GFR 30-59 ml/min 03/08/2016  . GERD without esophagitis 03/08/2016  . Constipation 12/29/2015  . Below knee amputation status (HCC) 10/01/2015  . Ankle pain 08/13/2015  . Adult failure to thrive   . PVD (peripheral vascular disease) (HCC)   . Acute kidney injury superimposed on chronic kidney disease (HCC) 08/10/2015  . Elevated blood uric acid level 08/10/2015  . 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 Anemia of chronic disease 08/10/2015  . Malnutrition of moderate degree 06/24/2015  . UTI (urinary tract infection) 06/23/2015  . Failure to thrive in adult 06/23/2015  . Weight loss 06/23/2015  . Tobacco abuse 06/23/2015  . COPD (chronic obstructive pulmonary disease) (HCC) 06/23/2015  . Fall   . Critical lower limb ischemia 05/31/2014  . Status post right foot surgery 03/12/2014   Past Medical History:  Diagnosis Date  . Adult failure to thrive    /notes 06/23/2015  . Anemia   . Arthritis    "hands" (06/23/2015)  . COPD (chronic obstructive pulmonary disease) (HCC)    LONG TIME SMOKER  . Critical lower limb ischemia   . 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  Depression    "periods of depression" (06/23/2015)  . Gout   . Physical deconditioning   . Pyelonephritis 06/23/2015  . Status post foot surgery    right fifth toe amputation by Dr. Lajoyce Cornersuda     Family History  Problem Relation Age of Onset  . Heart failure Father   . Heart attack Father   . Heart failure Brother     Past Surgical History:  Procedure Laterality Date  . AMPUTATION Right 03/01/2014   Procedure: AMPUTATION RAY;  Surgeon: Nadara MustardMarcus Duda V, MD;  Location: [facility] OR;  Service: Orthopedics;  Laterality: Right;  Right Foot 5th Ray Amputation  . AMPUTATION Right 04/26/2014   Procedure: Right Foot 4th Ray Amputation;  Surgeon: Nadara MustardMarcus Duda V, MD;  [facility];  Service:  Orthopedics;  Laterality: Right;  . AMPUTATION Right 10/01/2015   Procedure: Right Below Knee Amputation;  Surgeon: Nadara MustardMarcus Duda V, MD;  [facility];  Service: Orthopedics;  Laterality: Right;  . AMPUTATION Left 07/23/2016   Procedure: Left 2nd Toe Amputation;  Surgeon: Nadara MustardMarcus V Duda, MD;  Location: [facility] OR;  Service: Orthopedics;  Laterality: Left;  . ANKLE ARTHROSCOPY Right 08/13/2015   Procedure: ANKLE ARTHROSCOPY;  Surgeon: Nadara MustardMarcus Duda V, MD;  [facility];  Service: Orthopedics;  Laterality: Right;  . CATARACT EXTRACTION W/ INTRAOCULAR LENS  IMPLANT, BILATERAL Bilateral   . INGUINAL HERNIA REPAIR Left   . KNEE CARTILAGE SURGERY Left 1960's   football injury  . KNEE LIGAMENT RECONSTRUCTION Left 1960's  . LOWER EXTREMITY ANGIOGRAM N/A 05/27/2014   Procedure: LOWER EXTREMITY ANGIOGRAM;  Surgeon: Runell GessJonathan J Berry, MD;  [facility];  Service: Cardiovascular;  Laterality: N/A;  . STUMP REVISION Right 12/05/2015   Procedure: Revision Right Below Knee Amputation;  Surgeon: Nadara MustardMarcus Duda V, MD;  [facility];  Service: Orthopedics;  Laterality: Right;   Social History   Occupational History  . Not on file.   Social History Main Topics  . Smoking status: Former Smoker    Packs/day: 0.25    Years: 57.00    Types: Cigarettes  . Smokeless tobacco: Never Used  . Alcohol use Yes     Comment: 06/23/2015 "was drinking alot of beer; quit in 2015" 09/30/15 - no longer drinks  . Drug use: No  . Sexual activity: No

## 2016-08-12 ENCOUNTER — Telehealth: Payer: Self-pay

## 2016-08-12 NOTE — Telephone Encounter (Signed)
Attempted to return pts call. LVM with New pt appt.

## 2016-08-13 ENCOUNTER — Ambulatory Visit (INDEPENDENT_AMBULATORY_CARE_PROVIDER_SITE_OTHER): Payer: Medicare HMO | Admitting: Family

## 2016-08-13 DIAGNOSIS — I96 Gangrene, not elsewhere classified: Secondary | ICD-10-CM

## 2016-08-13 DIAGNOSIS — Z89422 Acquired absence of other left toe(s): Secondary | ICD-10-CM

## 2016-08-13 MED ORDER — NITROGLYCERIN 0.2 MG/HR TD PT24: 0.2000 mg | MEDICATED_PATCH | Freq: Every day | TRANSDERMAL | 2 refills | Status: DC

## 2016-08-13 NOTE — Progress Notes (Signed)
Office Visit Note   Patient: Caleb Hays           Date of Birth: 08/18/1940           MRN: [id number] Visit Date: 08/13/2016              Requested by: Oneal GroutMahima Pandey, MD [address] , [place] [address] PCP: [name], [name], MD   Assessment & Plan: Visit Diagnoses: No diagnosis found.  Plan: continue with daily wound cleansing. Silvadene dressings to the left great toe and anterior ankle. Continue touchdown weightbearing only for transfers. May will continue to progress with therapy for strengthening however do not want him weightbearing on the left foot with therapy.  Follow-Up Instructions: No Follow-up on file.   Orders:  No orders of the defined types were placed in this encounter.  No orders of the defined types were placed in this encounter.     Procedures: No procedures performed   Clinical Data: No additional findings.   Subjective: Chief Complaint  Patient presents with  . Left Foot - Follow-up    Patient is a 76 year old gentleman who is here today following up his second toe amputation left foot. Patient is doing well. Some redness and irritation around the incision, small amount of drainage, otherwise looks well.  Him has him no superficial ulcerations over the anterior left ankle from the wrapping. Also has ischemic changes to the distal tip of the left great toe. States he has a follow-up visit plan for the beginning of March with Dr. Darrick Pennafields for revascularization of left lower extremity.    Review of Systems  Constitutional: Negative for chills and fever.  Skin: Positive for wound.     Objective: Vital Signs: There were no vitals taken for this visit.  Physical Exam  Ortho Exam anterior left ankle with superficial ulcerations these are filled in with red granulation tissue there is no surrounding erythema odor or drainage or sign of infection Incision of the left second toe amputation is healing slowly. This has gaped a little  bit. It is 2 mm deep. Does not probe to bone. There is no drainage no erythema warmth or cellulitis at the incision area. The distal tip of the left great toe has ischemic changes about 15 mm in diameter. This is dry. No open ulceration or drainage. No ascending cellulitis.  Specialty Comments:  No specialty comments available.  Imaging: No results found.   PMFS History: Patient Active Problem List   Diagnosis Date Noted  . Hx of amputation of lesser toe, left (HCC) 08/06/2016  . Gangrene of toe of left foot (HCC)   . Toe infection 07/22/2016  . Osteomyelitis (HCC) 07/22/2016  . Skin ulcer of second toe of left foot (HCC) 07/01/2016  . Edema 05/05/2016  . Phantom pain 03/08/2016  . 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 Gout 03/08/2016  . Hypothyroidism (acquired) 03/08/2016  . CKD (chronic kidney disease) stage 3, GFR 30-59 ml/min 03/08/2016  . GERD without esophagitis 03/08/2016  . Constipation 12/29/2015  . Below knee amputation status (HCC) 10/01/2015  . Ankle pain 08/13/2015  . Adult failure to thrive   . PVD (peripheral vascular disease) (HCC)   . Acute kidney injury superimposed on chronic kidney disease (HCC) 08/10/2015  . Elevated blood uric acid level 08/10/2015  . 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 Anemia of chronic disease 08/10/2015  . Malnutrition of moderate degree 06/24/2015  . UTI (urinary tract infection)  06/23/2015  . Failure to thrive in adult 06/23/2015  . Weight loss 06/23/2015  . Tobacco abuse 06/23/2015  . COPD (chronic obstructive pulmonary disease) (HCC) 06/23/2015  . Fall   . Critical lower limb ischemia 05/31/2014  . Status post right foot surgery 03/12/2014   Past Medical History:  Diagnosis Date  . Adult failure to thrive    /notes 06/23/2015  . Anemia   . Arthritis    "hands" (06/23/2015)  . COPD (chronic obstructive pulmonary disease) (HCC)    LONG TIME SMOKER  . Critical lower limb ischemia   . 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 Depression    "periods of depression" (06/23/2015)  . Gout   . Physical deconditioning   . Pyelonephritis 06/23/2015  . Status post foot surgery    right fifth toe amputation by Dr. Lajoyce Corners     Family History  Problem Relation Age of Onset  . Heart failure Father   . Heart attack Father   . Heart failure Brother     Past Surgical History:  Procedure Laterality Date  . AMPUTATION Right 03/01/2014   Procedure: AMPUTATION RAY;  Surgeon: Nadara Mustard, MD;  Location: [facility] OR;  Service: Orthopedics;  Laterality: Right;  Right Foot 5th Ray Amputation  . AMPUTATION Right 04/26/2014   Procedure: Right Foot 4th Ray Amputation;  Surgeon: Nadara Mustard, MD;  Location[facility] OR;  Service: Orthopedics;  Laterality: Right;  . AMPUTATION Right 10/01/2015   Procedure: Right Below Knee Amputation;  Surgeon: Nadara Mustard, MD;  Location: [facility] OR;  Service: Orthopedics;  Laterality: Right;  . AMPUTATION Left 07/23/2016   Procedure: Left 2nd Toe Amputation;  Surgeon: Nadara Mustard, MD;  Location: [facility] OR;  Service: Orthopedics;  Laterality: Left;  . ANKLE ARTHROSCOPY Right 08/13/2015   Procedure: ANKLE ARTHROSCOPY;  Surgeon: Nadara Mustard, MD;  Location: [facility] OR;  Service: Orthopedics;  Laterality: Right;  . CATARACT EXTRACTION W/ INTRAOCULAR LENS  IMPLANT, BILATERAL Bilateral   . INGUINAL HERNIA REPAIR Left   . KNEE CARTILAGE SURGERY Left 1960's   football injury  . KNEE LIGAMENT RECONSTRUCTION Left 1960's  . LOWER EXTREMITY ANGIOGRAM N/A 05/27/2014   Procedure: LOWER EXTREMITY ANGIOGRAM;  Surgeon: Runell Gess, MD;  Location: [facility] CATH LAB;  Service: Cardiovascular;  Laterality: N/A;  . STUMP REVISION Right 12/05/2015   Procedure: Revision Right Below Knee Amputation;  Surgeon: Nadara Mustard, MD;  Location: [facility] OR;  Service: Orthopedics;  Laterality: Right;   Social History   Occupational History  . Not on file.   Social History Main Topics  . Smoking  status: Former Smoker    Packs/day: 0.25    Years: 57.00    Types: Cigarettes  . Smokeless tobacco: Never Used  . Alcohol use Yes     Comment: 06/23/2015 "was drinking alot of beer; quit in 2015" 09/30/15 - no longer drinks  . Drug use: No  . Sexual activity: No

## 2016-08-24 ENCOUNTER — Other Ambulatory Visit: Payer: Self-pay

## 2016-08-24 DIAGNOSIS — I739 Peripheral vascular disease, unspecified: Secondary | ICD-10-CM

## 2016-08-27 ENCOUNTER — Ambulatory Visit: Payer: Medicare HMO | Admitting: Cardiovascular Disease

## 2016-08-27 ENCOUNTER — Encounter (INDEPENDENT_AMBULATORY_CARE_PROVIDER_SITE_OTHER): Payer: Self-pay

## 2016-08-27 ENCOUNTER — Ambulatory Visit (INDEPENDENT_AMBULATORY_CARE_PROVIDER_SITE_OTHER): Payer: Medicare HMO | Admitting: Family

## 2016-08-27 DIAGNOSIS — I70229 Atherosclerosis of native arteries of extremities with rest pain, unspecified extremity: Secondary | ICD-10-CM

## 2016-08-27 DIAGNOSIS — I998 Other disorder of circulatory system: Secondary | ICD-10-CM

## 2016-08-27 DIAGNOSIS — Z89422 Acquired absence of other left toe(s): Secondary | ICD-10-CM

## 2016-08-27 MED ORDER — DOXYCYCLINE HYCLATE 100 MG PO TABS: 100.0000 mg | ORAL_TABLET | Freq: Two times a day (BID) | ORAL | 0 refills | Status: DC

## 2016-08-27 NOTE — Progress Notes (Signed)
Office Visit Note   Patient: Caleb Hays           Date of Birth: 1941/06/08           MRN: [id number] Visit Date: 08/27/2016              Requested by: Oneal GroutMahima Pandey, MD [address] [place], [place] [address] PCP: [name], [name], MD  Chief Complaint  Patient presents with  . Left Foot - Routine Post Op    07/26/16 left second toe amputation.    HPI: Left second toe amputation 08/06/16. The incision is still open and is covered with a dry dressing. The tips of the great toe and third toe are purple. There is no redness and no swelling. He states that he is set to go and see Dr. Darrick PennaFields to see if he can have revasc surgery to improve blood flow. He does have a nitro patch on. He is non weight bearing in a wheel chair. He states that the right BKA is doing well and does not have any concerns with this Rodena MedinAutumn L Forrest, RMA    Assessment & Plan: Visit Diagnoses:  1. Critical lower limb ischemia   2. Hx of amputation of lesser toe, left (HCC)     Plan: Him has an appointment for ABIs and a vascular surgery consult on March 15. A would like for this to be removed. Have requested an urgent vascular surgery consult. The patient will continue with current wound care cleansing the incision site daily. Applying Silvadene here as well as over the distal tips of the third and first toe. We'll follow-up with us in office in 2-3 weeks.  Follow-Up Instructions: Return in about 2 weeks (around 09/10/2016).   Ortho Exam The second toe amputation site on the left has dehisced. This is filled in with fibrinous exudative tissue is about 15 mm x 8 mm in size this has about 1 mm of depth. This does not probe. There is no surrounding erythema drainage or odor or sign of infection. However the distal tips of the great toe as well as the third toe on the left foot have ischemic changes.  Imaging: No results found.  Orders:  No orders of the defined types were placed in this encounter.  No orders  of the defined types were placed in this encounter.    Procedures: No procedures performed  Clinical Data: No additional findings.  Subjective: Review of Systems  Constitutional: Negative for chills and fever.  Skin: Positive for color change and wound.    Objective: Vital Signs: There were no vitals taken for this visit.  Specialty Comments:  No specialty comments available.  PMFS History: Patient Active Problem List   Diagnosis Date Noted  . Hx of amputation of lesser toe, left (HCC) 08/06/2016  . Gangrene of toe of left foot (HCC)   . Toe infection 07/22/2016  . Osteomyelitis (HCC) 07/22/2016  . Skin ulcer of second toe of left foot (HCC) 07/01/2016  . Edema 05/05/2016  . Phantom pain 03/08/2016  . 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 Gout 03/08/2016  . Hypothyroidism (acquired) 03/08/2016  . CKD (chronic kidney disease) stage 3, GFR 30-59 ml/min 03/08/2016  . GERD without esophagitis 03/08/2016  . Constipation 12/29/2015  . Below knee amputation status (HCC) 10/01/2015  . Ankle pain 08/13/2015  . Adult failure to thrive   . PVD (peripheral vascular disease) (HCC)   . Acute kidney injury superimposed  on chronic kidney disease (HCC) 08/10/2015  . Elevated blood uric acid level 08/10/2015  . 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 Anemia of chronic disease 08/10/2015  . Malnutrition of moderate degree 06/24/2015  . UTI (urinary tract infection) 06/23/2015  . Failure to thrive in adult 06/23/2015  . Weight loss 06/23/2015  . Tobacco abuse 06/23/2015  . COPD (chronic obstructive pulmonary disease) (HCC) 06/23/2015  . Fall   . Critical lower limb ischemia 05/31/2014  . Status post right foot surgery 03/12/2014   Past Medical History:  Diagnosis Date  . Adult failure to thrive    /notes 06/23/2015  . Anemia   . Arthritis    "hands" (06/23/2015)  . COPD (chronic obstructive pulmonary disease) (HCC)    LONG TIME SMOKER  . Critical lower  limb ischemia   . 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 Depression    "periods of depression" (06/23/2015)  . Gout   . Physical deconditioning   . Pyelonephritis 06/23/2015  . Status post foot surgery    right fifth toe amputation by Dr. Lajoyce Corners     Family History  Problem Relation Age of Onset  . Heart failure Father   . Heart attack Father   . Heart failure Brother     Past Surgical History:  Procedure Laterality Date  . AMPUTATION Right 03/01/2014   Procedure: AMPUTATION RAY;  Surgeon: Nadara Mustard, MD;  Location: [facility] OR;  Service: Orthopedics;  Laterality: Right;  Right Foot 5th Ray Amputation  . AMPUTATION Right 04/26/2014   Procedure: Right Foot 4th Ray Amputation;  Surgeon: Nadara Mustard, MD;  Location: [facility] OR;  Service: Orthopedics;  Laterality: Right;  . AMPUTATION Right 10/01/2015   Procedure: Right Below Knee Amputation;  Surgeon: Nadara Mustard, MD;  Location: [facility] OR;  Service: Orthopedics;  Laterality: Right;  . AMPUTATION Left 07/23/2016   Procedure: Left 2nd Toe Amputation;  Surgeon: Nadara Mustard, MD;  Location: [facility] OR;  Service: Orthopedics;  Laterality: Left;  . ANKLE ARTHROSCOPY Right 08/13/2015   Procedure: ANKLE ARTHROSCOPY;  Surgeon: Nadara Mustard, MD;  Location: [facility] OR;  Service: Orthopedics;  Laterality: Right;  . CATARACT EXTRACTION W/ INTRAOCULAR LENS  IMPLANT, BILATERAL Bilateral   . INGUINAL HERNIA REPAIR Left   . KNEE CARTILAGE SURGERY Left 1960's   football injury  . KNEE LIGAMENT RECONSTRUCTION Left 1960's  . LOWER EXTREMITY ANGIOGRAM N/A 05/27/2014   Procedure: LOWER EXTREMITY ANGIOGRAM;  Surgeon: Runell Gess, MD;  Location: [facility] CATH LAB;  Service: Cardiovascular;  Laterality: N/A;  . STUMP REVISION Right 12/05/2015   Procedure: Revision Right Below Knee Amputation;  Surgeon: Nadara Mustard, MD;  Location: [facility] OR;  Service: Orthopedics;  Laterality: Right;   Social History   Occupational History  . Not on file.   Social  History Main Topics  . Smoking status: Former Smoker    Packs/day: 0.25    Years: 57.00    Types: Cigarettes  . Smokeless tobacco: Never Used  . Alcohol use Yes     Comment: 06/23/2015 "was drinking alot of beer; quit in 2015" 09/30/15 - no longer drinks  . Drug use: No  . Sexual activity: No

## 2016-08-30 ENCOUNTER — Ambulatory Visit (HOSPITAL_COMMUNITY)
Admission: RE | Admit: 2016-08-30 | Discharge: 2016-08-30 | Disposition: A | Discharge status: Home / Self Care | Payer: Medicare HMO | Source: Ambulatory Visit | Attending: Vascular Surgery | Admitting: Vascular Surgery

## 2016-08-30 DIAGNOSIS — I739 Peripheral vascular disease, unspecified: Secondary | ICD-10-CM | POA: Diagnosis not present

## 2016-08-31 ENCOUNTER — Other Ambulatory Visit: Payer: Self-pay | Admitting: *Deleted

## 2016-08-31 ENCOUNTER — Ambulatory Visit (INDEPENDENT_AMBULATORY_CARE_PROVIDER_SITE_OTHER): Payer: Medicare HMO | Admitting: Vascular Surgery

## 2016-08-31 ENCOUNTER — Encounter: Payer: Self-pay | Admitting: *Deleted

## 2016-08-31 ENCOUNTER — Encounter: Payer: Self-pay | Admitting: Vascular Surgery

## 2016-08-31 VITALS — BP 104/56 | HR 62 | Temp 97.4°F | Resp 18 | Ht 76.0 in | Wt 158.0 lb

## 2016-08-31 DIAGNOSIS — I739 Peripheral vascular disease, unspecified: Secondary | ICD-10-CM | POA: Diagnosis not present

## 2016-08-31 NOTE — Progress Notes (Signed)
[facility]  Patient name: Caleb Hays [place]land MRN: [id number] DOB: [birth year] Sex: male  REASON FOR VISIT: Evaluation of gangrenous changes left foot  HPI: Caleb Hays Leas is a 76 y.o. male seen today for evaluation of gangrenous changes of her left foot. He had been scheduled to follow-up with Dr. Darrick Pennafields but was concerned regarding the progression of this and is seeing me today on an urgent basis. Is very complex past history. He did undergo formal arteriogram in November 2015. 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 He did have ulceration on his right toes which eventually healed with conservative treatment. 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 He is currently in a wheelchair. He reports that he is able to transfer bed to chair but never really walked with the prosthesis. Does have pain associated with the changes in his left foot. He has undergone a left second toe amputation with poor healing. He does have a dry gangrenous changes on the tips of his first and third toe.  Past Medical History:  Diagnosis Date  . Adult failure to thrive    /notes 06/23/2015  . Anemia   . Arthritis    "hands" (06/23/2015)  . COPD (chronic obstructive pulmonary disease) (HCC)    LONG TIME SMOKER  . Critical lower limb ischemia   . 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 Depression    "periods of depression" (06/23/2015)  . Gout   . Physical deconditioning   . Pyelonephritis 06/23/2015  . Status post foot surgery    right fifth toe amputation by Dr. Lajoyce Cornersuda     Family History  Problem Relation Age of Onset  . Heart failure Father   . Heart attack Father   . Heart failure Brother     SOCIAL HISTORY: Social History  Substance Use Topics  . Smoking status:  Former Smoker    Packs/day: 0.25    Years: 57.00    Types: Cigarettes  . Smokeless tobacco: Never Used  . Alcohol use Yes     Comment: 06/23/2015 "was drinking alot of beer; quit in 2015" 09/30/15 - no longer drinks    No Known Allergies  Current Outpatient Prescriptions  Medication Sig Dispense Refill  . allopurinol (ZYLOPRIM) 100 MG tablet Take 100 mg by mouth daily.     . cholecalciferol (VITAMIN D) 1000 units tablet Take 1,000 Units by mouth daily. Reported on 11/06/2015    . docusate sodium (COLACE) 100 MG capsule Take 100 mg by mouth 2 (two) times daily.    . famotidine (PEPCID) 20 MG tablet Take 20 mg by mouth daily. For GERD.    Marland Kitchen. levothyroxine (SYNTHROID, LEVOTHROID) 25 MCG tablet Take 25 mcg by mouth daily before breakfast.     . meloxicam (MOBIC) 7.5 MG tablet Take 7.5 mg by mouth daily.     . Multiple Vitamins-Minerals (MULTIVITAMIN WITH MINERALS) tablet Take 1 tablet by mouth daily.    Marland Kitchen. omeprazole (PRILOSEC) 20 MG capsule Take 20 mg by mouth daily.     Marland Kitchen. senna (SENOKOT) 8.6 MG TABS tablet Take 1 tablet (8.6 mg total) by mouth 2 (two) times daily. 30 each 0  . albuterol (PROVENTIL HFA;VENTOLIN HFA) 108 (90 BASE) MCG/ACT inhaler Inhale 2 puffs into the lungs 2 (two) times daily. Take every daily per niece    .  doxycycline (VIBRA-TABS) 100 MG tablet Take 1 tablet (100 mg total) by mouth 2 (two) times daily. (Patient not taking: Reported on 08/31/2016) 60 tablet 0  . loperamide (IMODIUM A-D) 2 MG tablet Give 1 - 2 tablets ( 2mg  - 4mg ) after initial dose following  each loose stool following. Do not give if < 3 stools or Dx c.diff or norovirus. So do not exceed > 16 mg/24 hr period.    . methocarbamol (ROBAXIN) 500 MG tablet Take 500 mg by mouth every 6 (six) hours as needed for muscle spasms.    . nitroGLYCERIN (NITRODUR - DOSED IN MG/24 HR) 0.2 mg/hr patch Place 1 patch (0.2 mg total) onto the skin daily. Rotate site along dorsum of foot daily. (Patient not taking: Reported on  08/31/2016) 30 patch 2  . oxyCODONE (OXY IR/ROXICODONE) 5 MG immediate release tablet Take one tablet by mouth three times daily for pain; Take one tablet by mouth every 4 hours as needed for moderate or severe pain 270 tablet 0  . traZODone (DESYREL) 50 MG tablet Take 50 mg by mouth at bedtime.      No current facility-administered medications for this visit.     REVIEW OF SYSTEMS:  [X]  denotes positive finding, [ ]  denotes negative finding Cardiac  Comments:  Chest pain or chest pressure:    Shortness of breath upon exertion:    Short of breath when lying flat:    Irregular heart rhythm:        Vascular    Pain in calf, thigh, or hip brought on by ambulation:    Pain in feet at night that wakes you up from your sleep:  x   Blood clot in your veins:    Leg swelling:           PHYSICAL EXAM: Vitals:   08/31/16 0857  BP: (!) 104/56  Pulse: 62  Resp: 18  Temp: 97.4 F (36.3 C)  TempSrc: Oral  SpO2: 96%  Weight: 158 lb (71.7 kg)  Height: 6\' 4"  (1.93 m)    GENERAL: The patient is a well-nourished male, in no acute distress. The vital signs are documented above. CARDIOVASCULAR: Rapid arteries without bruits bilaterally. 2+ radial and 2+ femoral pulses. Absent popliteal pulses and no pulses in his left foot PULMONARY: There is good air exchange  MUSCULOSKELETAL: Right below-knee amputation NEUROLOGIC: No focal weakness or paresthesias are detected. SKIN: Gangrene of the tips of the first and third toe on the left and an open poorly healing second toe amputation PSYCHIATRIC: The patient has a normal affect.  DATA:  Noninvasive studies reveal calcified vessels and monophasic flow at the dorsalis pedis and posterior tibial on his left foot  MEDICAL ISSUES: Long discussion with the patient. I feel he has very little chance for healing without revascularization. Explain the options would be to proceed with a higher level of amputation versus attempt at revascularization and limb  salvage. The patient does not want to consider amputation if at all possible. He is able to transfer from bed to chair. He is alert and oriented and understands our plan. Explained the degree of disease that he had in November 2015. Will schedule repeat arteriogram to determine revascularization options with the patient. This will be scheduled later this week with Dr. Darrick [name]    Larina Earthly, MD [facility] Specialists of [facility] Tel [phone] Pager [phone]

## 2016-09-03 ENCOUNTER — Encounter (HOSPITAL_COMMUNITY)
Admission: RE | Disposition: A | Discharge status: Home / Self Care | Payer: Self-pay | Source: Ambulatory Visit | Attending: Vascular Surgery

## 2016-09-03 ENCOUNTER — Ambulatory Visit (HOSPITAL_COMMUNITY)
Admission: RE | Admit: 2016-09-03 | Discharge: 2016-09-03 | Disposition: A | Discharge status: Home / Self Care | Payer: Medicare HMO | Source: Ambulatory Visit | Attending: Vascular Surgery | Admitting: Vascular Surgery

## 2016-09-03 ENCOUNTER — Encounter (HOSPITAL_COMMUNITY): Payer: Self-pay | Admitting: Vascular Surgery

## 2016-09-03 ENCOUNTER — Ambulatory Visit (HOSPITAL_BASED_OUTPATIENT_CLINIC_OR_DEPARTMENT_OTHER)
Admission: RE | Admit: 2016-09-03 | Discharge: 2016-09-03 | Disposition: A | Discharge status: Home / Self Care | Payer: Medicare HMO | Source: Ambulatory Visit | Attending: Vascular Surgery | Admitting: Vascular Surgery

## 2016-09-03 DIAGNOSIS — I70262 Atherosclerosis of native arteries of extremities with gangrene, left leg: Secondary | ICD-10-CM | POA: Insufficient documentation

## 2016-09-03 DIAGNOSIS — Z89519 Acquired absence of unspecified leg below knee: Secondary | ICD-10-CM | POA: Insufficient documentation

## 2016-09-03 DIAGNOSIS — Z87891 Personal history of nicotine dependence: Secondary | ICD-10-CM | POA: Diagnosis not present

## 2016-09-03 DIAGNOSIS — M19042 Primary osteoarthritis, left hand: Secondary | ICD-10-CM | POA: Insufficient documentation

## 2016-09-03 DIAGNOSIS — J449 Chronic obstructive pulmonary disease, unspecified: Secondary | ICD-10-CM | POA: Diagnosis not present

## 2016-09-03 DIAGNOSIS — F329 Major depressive disorder, single episode, unspecified: Secondary | ICD-10-CM | POA: Insufficient documentation

## 2016-09-03 DIAGNOSIS — Z8249 Family history of ischemic heart disease and other diseases of the circulatory system: Secondary | ICD-10-CM | POA: Insufficient documentation

## 2016-09-03 DIAGNOSIS — I70245 Atherosclerosis of native arteries of left leg with ulceration of other part of foot: Secondary | ICD-10-CM | POA: Diagnosis present

## 2016-09-03 DIAGNOSIS — M109 Gout, unspecified: Secondary | ICD-10-CM | POA: Insufficient documentation

## 2016-09-03 DIAGNOSIS — L97529 Non-pressure chronic ulcer of other part of left foot with unspecified severity: Secondary | ICD-10-CM | POA: Diagnosis not present

## 2016-09-03 DIAGNOSIS — I739 Peripheral vascular disease, unspecified: Secondary | ICD-10-CM

## 2016-09-03 DIAGNOSIS — M19041 Primary osteoarthritis, right hand: Secondary | ICD-10-CM | POA: Diagnosis not present

## 2016-09-03 HISTORY — PX: ABDOMINAL AORTOGRAM W/LOWER EXTREMITY: CATH118223

## 2016-09-03 LAB — POCT I-STAT, CHEM 8
BUN: 22 mg/dL — ABNORMAL HIGH (ref 6–20)
Calcium, Ion: 1.24 mmol/L (ref 1.15–1.40)
Chloride: 105 mmol/L (ref 101–111)
Creatinine, Ser: 1.2 mg/dL (ref 0.61–1.24)
GLUCOSE: 83 mg/dL (ref 65–99)
HCT: 34 % — ABNORMAL LOW (ref 39.0–52.0)
Hemoglobin: 11.6 g/dL — ABNORMAL LOW (ref 13.0–17.0)
POTASSIUM: 4.2 mmol/L (ref 3.5–5.1)
Sodium: 140 mmol/L (ref 135–145)
TCO2: 24 mmol/L (ref 0–100)

## 2016-09-03 SURGERY — ABDOMINAL AORTOGRAM W/LOWER EXTREMITY

## 2016-09-03 MED ORDER — HYDRALAZINE HCL 20 MG/ML IJ SOLN: 5.0000 mg | INTRAMUSCULAR | Status: DC | PRN

## 2016-09-03 MED ORDER — LABETALOL HCL 5 MG/ML IV SOLN: 10.0000 mg | INTRAVENOUS | Status: DC | PRN

## 2016-09-03 MED ORDER — ONDANSETRON HCL 4 MG/2ML IJ SOLN: 4.0000 mg | Freq: Four times a day (QID) | INTRAMUSCULAR | Status: DC | PRN

## 2016-09-03 MED ORDER — IODIXANOL 320 MG/ML IV SOLN
INTRAVENOUS | Status: DC | PRN
  Administered 2016-09-03: 180 mL via INTRA_ARTERIAL

## 2016-09-03 MED ORDER — SODIUM CHLORIDE 0.9 % IV SOLN
INTRAVENOUS | Status: DC
  Administered 2016-09-03: 08:00:00 via INTRAVENOUS

## 2016-09-03 MED ORDER — SODIUM CHLORIDE 0.45 % IV SOLN
INTRAVENOUS | Status: DC
Start: 2016-09-03 — End: 2016-09-03

## 2016-09-03 MED ORDER — LIDOCAINE HCL (PF) 1 % IJ SOLN
INTRAMUSCULAR | Status: AC
  Filled 2016-09-03: qty 30

## 2016-09-03 MED ORDER — PANTOPRAZOLE SODIUM 40 MG PO TBEC
40.0000 mg | DELAYED_RELEASE_TABLET | Freq: Every day | ORAL | Status: DC
  Filled 2016-09-03: qty 1

## 2016-09-03 MED ORDER — METOPROLOL TARTRATE 5 MG/5ML IV SOLN: 2.0000 mg | INTRAVENOUS | Status: DC | PRN

## 2016-09-03 MED ORDER — HEPARIN (PORCINE) IN NACL 2-0.9 UNIT/ML-% IJ SOLN
INTRAMUSCULAR | Status: AC
  Filled 2016-09-03: qty 1000

## 2016-09-03 MED ORDER — ALUM & MAG HYDROXIDE-SIMETH 200-200-20 MG/5ML PO SUSP
15.0000 mL | ORAL | Status: DC | PRN
  Filled 2016-09-03: qty 30

## 2016-09-03 MED ORDER — LIDOCAINE HCL (PF) 1 % IJ SOLN
INTRAMUSCULAR | Status: DC | PRN
  Administered 2016-09-03: 18 mL

## 2016-09-03 MED ORDER — OXYCODONE HCL 5 MG PO TABS: 5.0000 mg | ORAL_TABLET | ORAL | Status: DC | PRN

## 2016-09-03 MED ORDER — ACETAMINOPHEN 325 MG PO TABS
325.0000 mg | ORAL_TABLET | ORAL | Status: DC | PRN
  Filled 2016-09-03: qty 2

## 2016-09-03 MED ORDER — ACETAMINOPHEN 325 MG RE SUPP
325.0000 mg | RECTAL | Status: DC | PRN
  Filled 2016-09-03: qty 2

## 2016-09-03 MED ORDER — MORPHINE SULFATE (PF) 10 MG/ML IV SOLN: 2.0000 mg | INTRAVENOUS | Status: DC | PRN

## 2016-09-03 MED ORDER — HEPARIN (PORCINE) IN NACL 2-0.9 UNIT/ML-% IJ SOLN
INTRAMUSCULAR | Status: DC | PRN
  Administered 2016-09-03: 1000 mL

## 2016-09-03 SURGICAL SUPPLY — 9 items
CATH ANGIO 5F PIGTAIL 65CM (CATHETERS) ×2 IMPLANT
COVER PRB 48X5XTLSCP FOLD TPE (BAG) ×1 IMPLANT
COVER PROBE 5X48 (BAG) ×1
KIT PV (KITS) ×2 IMPLANT
SHEATH PINNACLE 5F 10CM (SHEATH) ×2 IMPLANT
SYR MEDRAD MARK V 150ML (SYRINGE) ×2 IMPLANT
TRANSDUCER W/STOPCOCK (MISCELLANEOUS) ×2 IMPLANT
TRAY PV CATH (CUSTOM PROCEDURE TRAY) ×2 IMPLANT
WIRE HITORQ VERSACORE ST 145CM (WIRE) ×6 IMPLANT

## 2016-09-03 NOTE — Op Note (Addendum)
Procedure: Abdominal aortogram with left lower extremity runoff  Preoperative diagnosis: Nonhealing wound left foot.   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 The patient was placed in supine position the Angio table. Both groins were prepped and draped in usual sterile fashion. Percent was used to identify the right common femoral artery. There was heavy calcification. Local anesthesia was infiltrated over this. An introducer needle was then used to cannulate the right common femoral artery in an 035 versacore wire threaded up in the abdominal aorta under fluoroscopic guidance. A 5 French sheath was pushed over the guidewire in the right common femoral artery. This was thoroughly flushed with heparinized saline. A 5 French pigtail catheter was then placed over the guidewire and an abdominal aortogram was obtained in an AP projection. The left and right renal arteries are patent. The infrarenal abdominal aorta is patent but calcified. The left and right common external and antiplatelet arteries are patent but again heavily calcified.  Next the pigtail catheter was pulled down just above the aortic bifurcation and oblique views of the pelvis were performed to confirm the above findings. At this point a lower extremity runoff was also obtained through the pigtail catheter.  In the right lower extremity the right common femoral artery is heavily calcified with a patent profunda and occlusion of the origin of the SFA. In the left lower extremity, the left common femoral and profunda femoris is patent. Left superficial femoral artery is occluded 1 cm after it's origin.  There is reconstitution of initially of a peroneal in the upper portion of the leg which gives off a posterior communicating branch at the level of the foot the anterior branch is occluded. The posterior tibial artery reconstitutes in the lower half of the leg and is in continuity although the foot. The anterior tibial artery reconstitutes in the lower half of the leg and supplies the dorsalis pedis artery.  At this point the pigtail catheter was removed over a guidewire. The 5 French sheath was thoroughly flushed with heparinized saline and left in place to be pulled in the holding area. The patient tolerated procedure well and there were no complications. The patient was taken to the holding area in stable condition.  Operative management: The patient will undergo left greater saphenous vein mapping today to see if he has adequate conduit to consider a peroneal bypass. The patient overall is fairly frail. So I have also discussed the angiographic findings of my partner Dr. Randie Heinzain to consider him for retrograde recanalization. We will come up with a plan with him in the next few days.  Caleb Brunsharles Sherese Heyward, MD [facility] Office: [phone] Pager: [phone]

## 2016-09-03 NOTE — Progress Notes (Signed)
Left Lower Extremity Vein Map    Left Great Saphenous Vein   Segment Diameter Comment  1. Origin 5.420mm   2. High Thigh 3.275mm   3. Mid Thigh 3.40mm   4. Low Thigh 3.605mm Branches  5. At Knee 2.714mm   6. High Calf 2.400mm   7. Low Calf 1.616mm Branches  8. Ankle 2.821mm Branches   Left greater saphenous vein is compressible and thrombus free. Vein runs medial in the distal thigh.  [employer] ElectricVirginia Abrahan Hays, RVS 09/03/2014 2:39 PM

## 2016-09-03 NOTE — H&P (View-Only) (Signed)
[facility]  Patient name: Caleb MarinerJerald J Hays MRN: [id number] DOB: 1941/06/29 Sex: male  REASON FOR VISIT: Evaluation of gangrenous changes left Hays  HPI: Caleb Hays. He had been scheduled to follow-up with Dr. Darrick Pennafields but was concerned regarding the progression of this and is seeing me today on an urgent basis. Is very complex past history. He did undergo formal arteriogram in November 2015. 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 He did have ulceration on his right toes which eventually healed with conservative treatment. 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 He is currently in a wheelchair. He reports that he is able to transfer bed to chair but never really walked with the prosthesis. Does have pain associated with the changes in his left Hays. He has undergone a left second toe amputation with poor healing. He does have a dry gangrenous changes on the tips of his first and third toe.  Past Medical History:  Diagnosis Date  . Adult failure to thrive    /notes 06/23/2015  . Anemia   . Arthritis    "hands" (06/23/2015)  . COPD (chronic obstructive pulmonary disease) (HCC)    LONG TIME SMOKER  . Critical lower limb ischemia   . 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 Depression    "periods of depression" (06/23/2015)  . Gout   . Physical deconditioning   . Pyelonephritis 06/23/2015  . Status post Hays surgery    right fifth toe amputation by Dr. Lajoyce Cornersuda     Family History  Problem Relation Age of Onset  . Heart failure Father   . Heart attack Father   . Heart failure Brother     SOCIAL HISTORY: Social History  Substance Use Topics  . Smoking status:  Former Smoker    Packs/day: 0.25    Years: 57.00    Types: Cigarettes  . Smokeless tobacco: Never Used  . Alcohol use Yes     Comment: 06/23/2015 "was drinking alot of beer; quit in 2015" 09/30/15 - no longer drinks    No Known Allergies  Current Outpatient Prescriptions  Medication Sig Dispense Refill  . allopurinol (ZYLOPRIM) 100 MG tablet Take 100 mg by mouth daily.     . cholecalciferol (VITAMIN D) 1000 units tablet Take 1,000 Units by mouth daily. Reported on 11/06/2015    . docusate sodium (COLACE) 100 MG capsule Take 100 mg by mouth 2 (two) times daily.    . famotidine (PEPCID) 20 MG tablet Take 20 mg by mouth daily. For GERD.    Marland Kitchen. levothyroxine (SYNTHROID, LEVOTHROID) 25 MCG tablet Take 25 mcg by mouth daily before breakfast.     . meloxicam (MOBIC) 7.5 MG tablet Take 7.5 mg by mouth daily.     . Multiple Vitamins-Minerals (MULTIVITAMIN WITH MINERALS) tablet Take 1 tablet by mouth daily.    Marland Kitchen. omeprazole (PRILOSEC) 20 MG capsule Take 20 mg by mouth daily.     Marland Kitchen. senna (SENOKOT) 8.6 MG TABS tablet Take 1 tablet (8.6 mg total) by mouth 2 (two) times daily. 30 each 0  . albuterol (PROVENTIL HFA;VENTOLIN HFA) 108 (90 BASE) MCG/ACT inhaler Inhale 2 puffs into the lungs 2 (two) times daily. Take every daily per niece    .  doxycycline (VIBRA-TABS) 100 MG tablet Take 1 tablet (100 mg total) by mouth 2 (two) times daily. (Patient not taking: Reported on 08/31/2016) 60 tablet 0  . loperamide (IMODIUM A-D) 2 MG tablet Give 1 - 2 tablets ( 2mg  - 4mg ) after initial dose following  each loose stool following. Do not give if < 3 stools or Dx c.diff or norovirus. So do not exceed > 16 mg/24 hr period.    . methocarbamol (ROBAXIN) 500 MG tablet Take 500 mg by mouth every 6 (six) hours as needed for muscle spasms.    . nitroGLYCERIN (NITRODUR - DOSED IN MG/24 HR) 0.2 mg/hr patch Place 1 patch (0.2 mg total) onto the skin daily. Rotate site along dorsum of Hays daily. (Patient not taking: Reported on  08/31/2016) 30 patch 2  . oxyCODONE (OXY IR/ROXICODONE) 5 MG immediate release tablet Take one tablet by mouth three times daily for pain; Take one tablet by mouth every 4 hours as needed for moderate or severe pain 270 tablet 0  . traZODone (DESYREL) 50 MG tablet Take 50 mg by mouth at bedtime.      No current facility-administered medications for this visit.     REVIEW OF SYSTEMS:  [X]  denotes positive finding, [ ]  denotes negative finding Cardiac  Comments:  Chest pain or chest pressure:    Shortness of breath upon exertion:    Short of breath when lying flat:    Irregular heart rhythm:        Vascular    Pain in calf, thigh, or hip brought on by ambulation:    Pain in feet at night that wakes you up from your sleep:  x   Blood clot in your veins:    Leg swelling:           PHYSICAL EXAM: Vitals:   08/31/16 0857  BP: (!) 104/56  Pulse: 62  Resp: 18  Temp: 97.4 F (36.3 C)  TempSrc: Oral  SpO2: 96%  Weight: 158 lb (71.7 kg)  Height: 6\' 4"  (1.93 m)    GENERAL: The patient is a well-nourished male, in no acute distress. The vital signs are documented above. CARDIOVASCULAR: Rapid arteries without bruits bilaterally. 2+ radial and 2+ femoral pulses. Absent popliteal pulses and no pulses in his left Hays PULMONARY: There is good air exchange  MUSCULOSKELETAL: Right below-knee amputation NEUROLOGIC: No focal weakness or paresthesias are detected. SKIN: Gangrene of the tips of the first and third toe on the left and an open poorly healing second toe amputation PSYCHIATRIC: The patient has a normal affect.  DATA:  Noninvasive studies reveal calcified vessels and monophasic flow at the dorsalis pedis and posterior tibial on his left Hays  MEDICAL ISSUES: Long discussion with the patient. I feel he has very little chance for healing without revascularization. Explain the options would be to proceed with a higher level of amputation versus attempt at revascularization and limb  salvage. The patient does not want to consider amputation if at all possible. He is able to transfer from bed to chair. He is alert and oriented and understands our plan. Explained the degree of disease that he had in November 2015. Will schedule repeat arteriogram to determine revascularization options with the patient. This will be scheduled later this week with Dr. Darrick [name]    Larina Earthly, MD [facility] and Vein Specialists of [facility] Tel [phone] Pager [phone]

## 2016-09-03 NOTE — Progress Notes (Signed)
22F sheath pulled from R femoral artery by Oliver HumStephanie Evans. 20 minutes manual pressure applied. Site level 0 with no hematoma noted. Site dressed with tegaderm and 4x4. Instructions given to pt. Bedrest begins at 1100. No distal pulses, BKA.

## 2016-09-03 NOTE — Interval H&P Note (Signed)
History and Physical Interval Note:  09/03/2016 8:41 AM  Caleb Hays  has presented today for surgery, with the diagnosis of claudication  with ulcer   The various methods of treatment have been discussed with the patient and family. After consideration of risks, benefits and other options for treatment, the patient has consented to  Procedure(s): Lower Extremity Angiography (N/A) as a surgical intervention .  The patient's history has been reviewed, patient examined, no change in status, stable for surgery.  I have reviewed the patient's chart and labs.  Questions were answered to the patient's satisfaction.     Fabienne BrunsFields, Caleb Hays

## 2016-09-03 NOTE — Discharge Instructions (Signed)

## 2016-09-08 ENCOUNTER — Other Ambulatory Visit: Payer: Self-pay

## 2016-09-08 ENCOUNTER — Non-Acute Institutional Stay (SKILLED_NURSING_FACILITY): Payer: Medicare HMO | Admitting: Family

## 2016-09-08 ENCOUNTER — Encounter: Payer: Self-pay | Admitting: Family

## 2016-09-08 DIAGNOSIS — I998 Other disorder of circulatory system: Secondary | ICD-10-CM

## 2016-09-08 DIAGNOSIS — E039 Hypothyroidism, unspecified: Secondary | ICD-10-CM

## 2016-09-08 DIAGNOSIS — K219 Gastro-esophageal reflux disease without esophagitis: Secondary | ICD-10-CM | POA: Diagnosis not present

## 2016-09-08 DIAGNOSIS — R399 Unspecified symptoms and signs involving the genitourinary system: Secondary | ICD-10-CM

## 2016-09-08 DIAGNOSIS — K5901 Slow transit constipation: Secondary | ICD-10-CM

## 2016-09-08 NOTE — Progress Notes (Signed)
Location:  [facility] Room Number: [phone] Place of Service:  SNF (31) Provider:  Dinah Ngetich FNP-C   Oneal Grout, MD  Patient Care Team: Oneal Grout, MD as PCP - General (Internal Medicine) Runell Gess, MD as Consulting Physician (Cardiology) Nadara Mustard, MD as Consulting Physician (Orthopedic Surgery) Abelino Derrick, PA-C as Physician Assistant (Cardiology)  Extended Emergency Contact Information Primary Emergency Contact: [facility] Address: [address]          [place], [place] [address] [place] Home Phone: [phone] Mobile Phone: [phone] Relation: Niece Secondary Emergency Contact: [name] of [place] Home Phone: [phone] Relation: Friend  Code Status: Full Code  Goals of care: Advanced Directive information Advanced Directives 09/08/2016  Does Patient Have a Medical Advance Directive? No  Type of Advance Directive -  Does patient want to make changes to medical advance directive? -  Copy of Healthcare Power of Attorney in Chart? -  Would patient like information on creating a medical advance directive? -     Chief Complaint  Patient presents with  . Medical Management of Chronic Issues    Routine Visit    HPI:  Pt is a 76 y.o. male seen today at [facility]  for medical management of chronic diseases. He has a medical history of HTN, PVD, COPD, Hypothyroidism, Right BKA among other conditions. He is seen in his room today lying in bed watching TV. He complains of burning sensation with urination.He states has an appointment with Vascular and vein specialist to evaluate for revascularization of left leg. He has ischemia to left great toe, third and fourth toes. He had left second toe amputation 08/06/2017 with non healing wound managed by wound care nurse.He was seen by [facility] 08/27/2016 referred to vascular and vein specialist. He has a follow up  appointment 09/10/2016 with [facility] ortho. He was seen by vascular and vein specialist of [facility] 09/03/2016 scheduled for revascularization and limb salvage 09/15/2016. Patient does not want amputation if possible. He denies any fever, chills,cough, nausea or vomiting.He continues to spend most time in the room with lights off , blinds closed and door shut.He denies feeling depressed. He gets out of the room occasionally to get coffee or snacks. Facility Nurse reports no new concerns.    Past Medical History:  Diagnosis Date  . Adult failure to thrive    /notes 06/23/2015  . Anemia   . Arthritis    "hands" (06/23/2015)  . COPD (chronic obstructive pulmonary disease) (HCC)    LONG TIME SMOKER  . Critical lower limb ischemia   . 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 Depression    "periods of depression" (06/23/2015)  . Gout   . Physical deconditioning   . Pyelonephritis 06/23/2015  . Status post foot surgery    right fifth toe amputation by Dr. Lajoyce Corners    Past Surgical History:  Procedure Laterality Date  . ABDOMINAL AORTOGRAM W/LOWER EXTREMITY N/A 09/03/2016   Procedure: Abdominal Aortogram w/Lower Extremity;  Surgeon: Sherren Kerns, MD;  Location: [facility] INVASIVE CV LAB;  Service: Cardiovascular;  Laterality: N/A;  Lt. leg  . AMPUTATION Right 03/01/2014   Procedure: AMPUTATION RAY;  Surgeon: Nadara Mustard, MD;  Location: [facility] OR;  Service: Orthopedics;  Laterality: Right;  Right Foot 5th Ray Amputation  . AMPUTATION Right 04/26/2014   Procedure: Right Foot 4th Ray Amputation;  Surgeon: Berna Spare  Kandis Mannanuda V, MD;  Location: [facility] OR;  Service: Orthopedics;  Laterality: Right;  . AMPUTATION Right 10/01/2015   Procedure: Right Below Knee Amputation;  Surgeon: Nadara MustardMarcus Duda V, MD;  [facility];  Service: Orthopedics;  Laterality: Right;  . AMPUTATION Left 07/23/2016   Procedure: Left 2nd Toe Amputation;  Surgeon: Nadara MustardMarcus V Duda, MD;  Location: [facility] OR;  Service: Orthopedics;   Laterality: Left;  . ANKLE ARTHROSCOPY Right 08/13/2015   Procedure: ANKLE ARTHROSCOPY;  Surgeon: Nadara MustardMarcus Duda V, MD;  [facility];  Service: Orthopedics;  Laterality: Right;  . CATARACT EXTRACTION W/ INTRAOCULAR LENS  IMPLANT, BILATERAL Bilateral   . INGUINAL HERNIA REPAIR Left   . KNEE CARTILAGE SURGERY Left 1960's   football injury  . KNEE LIGAMENT RECONSTRUCTION Left 1960's  . LOWER EXTREMITY ANGIOGRAM N/A 05/27/2014   Procedure: LOWER EXTREMITY ANGIOGRAM;  Surgeon: Runell GessJonathan J Berry, MD;  [facility];  Service: Cardiovascular;  Laterality: N/A;  . STUMP REVISION Right 12/05/2015   Procedure: Revision Right Below Knee Amputation;  Surgeon: Nadara MustardMarcus Duda V, MD;  [facility];  Service: Orthopedics;  Laterality: Right;    No Known Allergies  Allergies as of 09/08/2016   No Known Allergies     Medication List       Accurate as of 09/08/16  2:40 PM. Always use your most recent med list.          albuterol 108 (90 Base) MCG/ACT inhaler Commonly known as:  PROVENTIL HFA;VENTOLIN HFA Inhale 2 puffs into the lungs 2 (two) times daily. Take every daily per niece   allopurinol 100 MG tablet Commonly known as:  ZYLOPRIM Take 100 mg by mouth daily. 0900   cholecalciferol 1000 units tablet Commonly known as:  VITAMIN D Take 1,000 Units by mouth daily. Reported on 11/06/2015   docusate sodium 100 MG capsule Commonly known as:  COLACE Take 100 mg by mouth 2 (two) times daily.   doxycycline 100 MG tablet Commonly known as:  VIBRA-TABS Take 1 tablet (100 mg total) by mouth 2 (two) times daily.   famotidine 20 MG tablet Commonly known as:  PEPCID Take 20 mg by mouth daily. For GERD.   gabapentin 100 MG capsule Commonly known as:  NEURONTIN Take 200 mg by mouth at bedtime.   levothyroxine 25 MCG tablet Commonly known as:  SYNTHROID, LEVOTHROID Take 12.5 mcg by mouth daily at 6 (six) AM. 1/2 tablet (12.5 mcg)   loperamide 2 MG tablet Commonly known as:  IMODIUM  A-D Give  2 tablets  4mg ) after initial dose following  each loose stool following. Do not give if < 3 stools or Dx c.diff or norovirus. So do not exceed > 16 mg/24 hr period.   meloxicam 7.5 MG tablet Commonly known as:  MOBIC Take 7.5 mg by mouth daily.   methocarbamol 500 MG tablet Commonly known as:  ROBAXIN Take 500 mg by mouth every 6 (six) hours as needed for muscle spasms.   multivitamin with minerals tablet Take 1 tablet by mouth daily.   nitroGLYCERIN 0.2 mg/hr patch Commonly known as:  NITRODUR - Dosed in mg/24 hr Place 1 patch (0.2 mg total) onto the skin daily. Rotate site along dorsum of foot daily.   NUTRITIONAL SUPPLEMENT PO Initiate Med Pass 2.0 , 90 mls by mouth 2 times daily   omeprazole 20 MG capsule Commonly known as:  PRILOSEC Take 20 mg by mouth daily at 6 (six) AM.   oxyCODONE 5 MG immediate release  tablet Commonly known as:  Oxy IR/ROXICODONE Take 5 mg by mouth every 4 (four) hours as needed for moderate pain or severe pain.   polyethylene glycol packet Commonly known as:  MIRALAX / GLYCOLAX Take 17 g by mouth daily. Hold for loose stool   senna 8.6 MG Tabs tablet Commonly known as:  SENOKOT Take 1 tablet (8.6 mg total) by mouth 2 (two) times daily.   traZODone 50 MG tablet Commonly known as:  DESYREL Take 50 mg by mouth at bedtime.       Review of Systems  Constitutional: Negative for activity change, appetite change, chills, fatigue and fever.  HENT: Negative for congestion, postnasal drip, rhinorrhea, sinus pressure, sneezing and sore throat.   Eyes: Negative.   Respiratory: Negative for cough, chest tightness, shortness of breath and wheezing.   Cardiovascular: Negative for chest pain, palpitations and leg swelling.  Gastrointestinal: Negative for abdominal distention, abdominal pain, constipation, diarrhea, nausea and vomiting.  Endocrine: Negative for cold intolerance, heat intolerance, polydipsia, polyphagia and polyuria.   Genitourinary: Negative for dysuria, frequency and urgency.       Burning sensation with voiding   Musculoskeletal: Positive for gait problem.  Skin: Positive for color change. Negative for pallor and rash.       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    Allergic/Immunologic: Negative.   Neurological: Negative for dizziness, seizures, syncope, light-headedness and headaches.  Hematological: Does not bruise/bleed easily.  Psychiatric/Behavioral: Negative for agitation, confusion, hallucinations and sleep disturbance. The patient is not nervous/anxious.     Immunization History  Administered Date(s) Administered  . Influenza,inj,Quad PF,36+ Mos 06/25/2015  . PPD Test 10/03/2015, 10/17/2015  . Pneumococcal Polysaccharide-23 06/25/2015   Pertinent  Health Maintenance Due  Topic Date Due  . COLONOSCOPY  03/04/1992  . PNA vac Low Risk Adult (2 of 2 - PCV13) 06/24/2016  . INFLUENZA VACCINE  07/19/2017 (Originally 02/17/2016)      Vitals:   09/08/16 1410  BP: 128/60  Pulse: 81  Resp: 20  Temp: 98.1 F (36.7 C)  SpO2: 99%  Weight: 172 lb 8 oz (78.2 kg)  Height: 6\' 4"  (1.93 m)   Body mass index is 21 kg/m. Physical Exam  Constitutional: He is oriented to person, place, and time.  Thin frail Elderly in no acute distress lying bed watching TV.   HENT:  Head: Normocephalic.  Mouth/Throat: Oropharynx is clear and moist.  Eyes: Conjunctivae and EOM are normal. Pupils are equal, round, and reactive to light. Right eye exhibits no discharge. Left eye exhibits no discharge. No scleral icterus.  Neck: Normal range of motion. No JVD present. No thyromegaly present.  Cardiovascular: Normal rate, regular rhythm, normal heart sounds and intact distal pulses.  Exam reveals no gallop and no friction rub.   No murmur heard. Pulmonary/Chest: Effort normal and breath sounds normal. No respiratory distress. He has no wheezes. He has no rales.  Abdominal: Soft. Bowel sounds are  normal. He exhibits no distension and no mass. There is no tenderness. There is no rebound and no guarding.  Genitourinary:  Genitourinary Comments: Voids on urinal   Musculoskeletal: He exhibits no edema or tenderness.  Right BKA. 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   Lymphadenopathy:    He has no cervical adenopathy.  Neurological: He is oriented to person, place, and time.  Skin: Skin is warm and dry. No rash noted. No erythema. No pallor.  Left 2nd toe amputee wound bed without any signs of  infections managed by wound Nurse.  Left great, third and fourth toes black in color non tender to touch managed by wound care Nurse. Has follow up appointment with Ortho and Vascular and vein specialist.   Psychiatric: He has a normal mood and affect.    Labs reviewed:  Recent Labs  07/25/16 0336 07/26/16 0746 07/27/16 0854 08/02/16 09/03/16 0809  NA 136 136 135 140 140  K 5.1 4.5 4.7 5.2 4.2  CL 107 104 104  --  105  CO2 20* 24 24  --   --   GLUCOSE 73 87 105*  --  83  BUN 22* 25* 23* 30* 22*  CREATININE 1.61* 1.69* 1.57* 1.6* 1.20  CALCIUM 8.9 9.0 9.4  --   --     Recent Labs  10/01/15 1104 10/07/15 04/12/16  AST 17 10* 9*  ALT 15* 5* 5*  ALKPHOS 113 85 70  BILITOT 0.7  --   --   PROT 5.8*  --   --   ALBUMIN 2.5*  --   --     Recent Labs  04/12/16 07/22/16 1626  07/25/16 0336 07/26/16 0746 07/27/16 0854 08/02/16 09/03/16 0809  WBC 8.0 17.2*  < > 12.4* 12.4* 13.0* 12.1  --   NEUTROABS 3 10.3*  --   --   --   --   --   --   HGB 10.3* 10.6*  < > 9.6* 10.4* 10.4* 10.3* 11.6*  HCT 34* 33.8*  < > 30.8* 32.7* 33.0* 33* 34.0*  MCV  --  72.7*  < > 73.0* 72.2* 72.4*  --   --   PLT 321 293  < > 232 268 274 321  --   < > = values in this interval not displayed. Lab Results  Component Value Date   TSH 3.56 03/15/2016   Lab Results  Component Value Date   HGBA1C 5.4 07/23/2016   Lab Results  Component Value Date   CHOL 129 02/09/2016   HDL 22 (A) 02/09/2016    LDLCALC 78 02/09/2016   TRIG 144 02/09/2016   Assessment/Plan 1. Hypothyroidism  Continue levothyroxine. TSH level 09/09/2016   2. Ischemia of left toes Left 2nd toe amputee wound bed without any signs of infections managed by wound Nurse.  Left great, third and fourth toes black in color non tender to touch managed by wound care Nurse. Has follow up appointment with Ortho and Vascular and vein specialist. CBC/diff, BMP 09/09/2016  3. Constipation  LBM 09/07/2016 continue current regimen.   4.  GERD Stable.Continue on omeprazole.   5. UTI  Reports burning sensation with voiding.will obtain urine specimen for U/A and C/S rule UTI.    Family/ staff Communication: Reviewed plan of care with patient and facility   Labs/tests ordered: CBC/diff, BMP and TSH level 09/09/2016

## 2016-09-09 LAB — BASIC METABOLIC PANEL
BUN: 21 mg/dL (ref 4–21)
CREATININE: 1.1 mg/dL (ref 0.6–1.3)
Glucose: 81 mg/dL
Potassium: 4.5 mmol/L (ref 3.4–5.3)
Sodium: 142 mmol/L (ref 137–147)

## 2016-09-09 LAB — CBC AND DIFFERENTIAL
HEMATOCRIT: 33 % — AB (ref 41–53)
HEMOGLOBIN: 10 g/dL — AB (ref 13.5–17.5)
PLATELETS: 302 10*3/uL (ref 150–399)
WBC: 8.2 10^3/mL

## 2016-09-09 LAB — TSH: TSH: 4.83 u[IU]/mL (ref 0.41–5.90)

## 2016-09-13 ENCOUNTER — Ambulatory Visit (INDEPENDENT_AMBULATORY_CARE_PROVIDER_SITE_OTHER): Payer: Medicare HMO | Admitting: Family

## 2016-09-13 ENCOUNTER — Encounter (INDEPENDENT_AMBULATORY_CARE_PROVIDER_SITE_OTHER): Payer: Self-pay | Admitting: Orthopedic Surgery

## 2016-09-13 DIAGNOSIS — Z89422 Acquired absence of other left toe(s): Secondary | ICD-10-CM

## 2016-09-13 DIAGNOSIS — I739 Peripheral vascular disease, unspecified: Secondary | ICD-10-CM

## 2016-09-13 MED ORDER — SILVER SULFADIAZINE 1 % EX CREA: 1.0000 "application " | TOPICAL_CREAM | Freq: Every day | CUTANEOUS | 0 refills | Status: DC

## 2016-09-13 NOTE — Progress Notes (Signed)
Office Visit Note   Patient: Caleb Hays           Date of Birth: Dec 06, 1940           MRN: [id number] Visit Date: 09/13/2016              Requested by: Oneal Grout, MD [address] [place], [place] [address] PCP: Oneal Grout, MD  Chief Complaint  Patient presents with  . Left Foot - Routine Post Op    07/23/16 Left 2nd Toe Amputation     HPI: Patient is 76 y.o male who presents today for follow up left foot. He is status post abdominal aortogram with left lower extremity runoff with Dr. Darrick [name]. He is going to have a stent placed on Wednesday. 1st and 3rd toes distally are black. He is wearing silver alginate dressing over surgical site of left 2nd toe amputation. There is 100% fibrinous tissue. He is wearing nitroglycerin patches daily. He currently resides at [facility]. Donalee Citrin, RT  Patient is a 76 year old gentleman seen today status post amputation of the left foot second toe. This has dehisced. The skilled nursing home has been doing silver alginate dressing changes daily. This continues to have drainage. He has been seen by vascular who has him scheduled for stent placement in 2 days. Does have ischemic changes to the distal's first and third toes. It does not appear the nursing home has been doing any dressings to these.    Assessment & Plan: Visit Diagnoses:  1. Hx of amputation of lesser toe, left (HCC)   2. PVD (peripheral vascular disease) (HCC)     Plan: Have provided orders for Silvadene dressing changes to the distal first and third toes. He will continue with silver alginate dressing changes daily to the incision. He will continue with his oral doxycycline. Follow-up with [place] in about 3 weeks.  may need to consider amputation or limb salvage surgery for the first and third toes.  Follow-Up Instructions: Return in about 3 weeks (around 10/04/2016).   Ortho Exam Physical Exam  Constitutional: Appears well-developed.  Head:  Normocephalic.  Eyes: EOM are normal.  Neck: Normal range of motion.  Cardiovascular: Normal rate.   Pulmonary/Chest: Effort normal.  Neurological: Is alert.  Skin: Skin is warm.  Psychiatric: Has a normal mood and affect. left foot him: There is +1 pitting edema to the lower extremity. The second toe amputation site has dehisced. This has no depth is 2 cm x 1 cm in size there is fibrinous exudative tissue in the whole wound bed. There is clear drainage. There is a little surrounding erythema. They third and first toes have distal ischemic changes.  the third toe is erythematous and swollen. This is painful. There is no odor no ascending cellulitis  Imaging: No results found.  Orders:  No orders of the defined types were placed in this encounter.  Meds ordered this encounter  Medications  . silver sulfADIAZINE (SILVADENE) 1 % cream    Sig: Apply 1 application topically daily.    Dispense:  50 g    Refill:  0     Procedures: No procedures performed  Clinical Data: No additional findings.  Subjective: Review of Systems  Constitutional: Negative for chills and fever.    Objective: Vital Signs: There were no vitals taken for this visit.  Specialty Comments:  No specialty comments available.  PMFS History: Patient Active Problem List   Diagnosis Date Noted  .  Hx of amputation of lesser toe, left (HCC) 08/06/2016  . Gangrene of toe of left foot (HCC)   . Toe infection 07/22/2016  . Osteomyelitis (HCC) 07/22/2016  . Skin ulcer of second toe of left foot (HCC) 07/01/2016  . Edema 05/05/2016  . Phantom pain 03/08/2016  . 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 Gout 03/08/2016  . Hypothyroidism (acquired) 03/08/2016  . CKD (chronic kidney disease) stage 3, GFR 30-59 ml/min 03/08/2016  . GERD without esophagitis 03/08/2016  . Constipation 12/29/2015  . Below knee amputation status (HCC) 10/01/2015  . Ankle pain 08/13/2015  . Adult failure to thrive     . PVD (peripheral vascular disease) (HCC)   . Acute kidney injury superimposed on chronic kidney disease (HCC) 08/10/2015  . Elevated blood uric acid level 08/10/2015  . 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 Anemia of chronic disease 08/10/2015  . Malnutrition of moderate degree 06/24/2015  . UTI (urinary tract infection) 06/23/2015  . Failure to thrive in adult 06/23/2015  . Weight loss 06/23/2015  . Tobacco abuse 06/23/2015  . COPD (chronic obstructive pulmonary disease) (HCC) 06/23/2015  . Fall   . Critical lower limb ischemia 05/31/2014  . Status post right foot surgery 03/12/2014   Past Medical History:  Diagnosis Date  . Adult failure to thrive    /notes 06/23/2015  . Anemia   . Arthritis    "hands" (06/23/2015)  . COPD (chronic obstructive pulmonary disease) (HCC)    LONG TIME SMOKER  . Critical lower limb ischemia   . 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 Depression    "periods of depression" (06/23/2015)  . Gout   . Physical deconditioning   . Pyelonephritis 06/23/2015  . Status post foot surgery    right fifth toe amputation by Dr. Lajoyce Corners     Family History  Problem Relation Age of Onset  . Heart failure Father   . Heart attack Father   . Heart failure Brother     Past Surgical History:  Procedure Laterality Date  . ABDOMINAL AORTOGRAM W/LOWER EXTREMITY N/A 09/03/2016   Procedure: Abdominal Aortogram w/Lower Extremity;  Surgeon: Sherren Kerns, MD;  Location: [facility] INVASIVE CV LAB;  Service: Cardiovascular;  Laterality: N/A;  Lt. leg  . AMPUTATION Right 03/01/2014   Procedure: AMPUTATION RAY;  Surgeon: Nadara Mustard, MD;  Location: [facility] OR;  Service: Orthopedics;  Laterality: Right;  Right Foot 5th Ray Amputation  . AMPUTATION Right 04/26/2014   Procedure: Right Foot 4th Ray Amputation;  Surgeon: Nadara Mustard, MD;  Location: [facility] OR;  Service: Orthopedics;  Laterality: Right;  . AMPUTATION Right 10/01/2015   Procedure: Right Below  Knee Amputation;  Surgeon: Nadara Mustard, MD;  Location: [facility] OR;  Service: Orthopedics;  Laterality: Right;  . AMPUTATION Left 07/23/2016   Procedure: Left 2nd Toe Amputation;  Surgeon: Nadara Mustard, MD;  Location: [facility] OR;  Service: Orthopedics;  Laterality: Left;  . ANKLE ARTHROSCOPY Right 08/13/2015   Procedure: ANKLE ARTHROSCOPY;  Surgeon: Nadara Mustard, MD;  Location: [facility] OR;  Service: Orthopedics;  Laterality: Right;  . CATARACT EXTRACTION W/ INTRAOCULAR LENS  IMPLANT, BILATERAL Bilateral   . INGUINAL HERNIA REPAIR Left   . KNEE CARTILAGE SURGERY Left 1960's   football injury  . KNEE LIGAMENT RECONSTRUCTION Left 1960's  . LOWER EXTREMITY ANGIOGRAM N/A 05/27/2014   Procedure: LOWER EXTREMITY ANGIOGRAM;  Surgeon: Runell Gess, MD;  Location:  [facility] CATH LAB;  Service: Cardiovascular;  Laterality: N/A;  . STUMP REVISION Right 12/05/2015   Procedure: Revision Right Below Knee Amputation;  Surgeon: Nadara MustardMarcus Duda V, MD;  [facility];  Service: Orthopedics;  Laterality: Right;   Social History   Occupational History  . Not on file.   Social History Main Topics  . Smoking status: Former Smoker    Packs/day: 0.25    Years: 57.00    Types: Cigarettes  . Smokeless tobacco: Never Used  . Alcohol use Yes     Comment: 06/23/2015 "was drinking alot of beer; quit in 2015" 09/30/15 - no longer drinks  . Drug use: No  . Sexual activity: No

## 2016-09-15 ENCOUNTER — Encounter (HOSPITAL_COMMUNITY): Payer: Self-pay | Admitting: General Practice

## 2016-09-15 ENCOUNTER — Observation Stay (HOSPITAL_COMMUNITY)
Admission: RE | Admit: 2016-09-15 | Discharge: 2016-09-16 | Disposition: A | Discharge status: Skilled Nursing Facility | Payer: Medicare HMO | Source: Ambulatory Visit | Attending: Vascular Surgery | Admitting: Vascular Surgery

## 2016-09-15 ENCOUNTER — Encounter (HOSPITAL_COMMUNITY)
Admission: RE | Disposition: A | Discharge status: Skilled Nursing Facility | Payer: Self-pay | Source: Ambulatory Visit | Attending: Vascular Surgery

## 2016-09-15 DIAGNOSIS — J449 Chronic obstructive pulmonary disease, unspecified: Secondary | ICD-10-CM | POA: Insufficient documentation

## 2016-09-15 DIAGNOSIS — Z89421 Acquired absence of other right toe(s): Secondary | ICD-10-CM | POA: Insufficient documentation

## 2016-09-15 DIAGNOSIS — N183 Chronic kidney disease, stage 3 (moderate): Secondary | ICD-10-CM | POA: Diagnosis not present

## 2016-09-15 DIAGNOSIS — F329 Major depressive disorder, single episode, unspecified: Secondary | ICD-10-CM | POA: Insufficient documentation

## 2016-09-15 DIAGNOSIS — D638 Anemia in other chronic diseases classified elsewhere: Secondary | ICD-10-CM | POA: Diagnosis not present

## 2016-09-15 DIAGNOSIS — K219 Gastro-esophageal reflux disease without esophagitis: Secondary | ICD-10-CM | POA: Diagnosis not present

## 2016-09-15 DIAGNOSIS — Z89511 Acquired absence of right leg below knee: Secondary | ICD-10-CM | POA: Insufficient documentation

## 2016-09-15 DIAGNOSIS — Z8249 Family history of ischemic heart disease and other diseases of the circulatory system: Secondary | ICD-10-CM | POA: Insufficient documentation

## 2016-09-15 DIAGNOSIS — L97529 Non-pressure chronic ulcer of other part of left foot with unspecified severity: Secondary | ICD-10-CM | POA: Diagnosis not present

## 2016-09-15 DIAGNOSIS — I739 Peripheral vascular disease, unspecified: Secondary | ICD-10-CM | POA: Diagnosis present

## 2016-09-15 DIAGNOSIS — M1A9XX1 Chronic gout, unspecified, with tophus (tophi): Secondary | ICD-10-CM | POA: Insufficient documentation

## 2016-09-15 DIAGNOSIS — I70262 Atherosclerosis of native arteries of extremities with gangrene, left leg: Secondary | ICD-10-CM | POA: Diagnosis not present

## 2016-09-15 DIAGNOSIS — Z87891 Personal history of nicotine dependence: Secondary | ICD-10-CM | POA: Insufficient documentation

## 2016-09-15 DIAGNOSIS — E039 Hypothyroidism, unspecified: Secondary | ICD-10-CM | POA: Diagnosis not present

## 2016-09-15 DIAGNOSIS — I998 Other disorder of circulatory system: Secondary | ICD-10-CM

## 2016-09-15 HISTORY — PX: PERIPHERAL VASCULAR INTERVENTION: CATH118257

## 2016-09-15 HISTORY — PX: LOWER EXTREMITY ANGIOGRAPHY: CATH118251

## 2016-09-15 LAB — POCT I-STAT, CHEM 8
BUN: 21 mg/dL — AB (ref 6–20)
CREATININE: 1.1 mg/dL (ref 0.61–1.24)
Calcium, Ion: 1.09 mmol/L — ABNORMAL LOW (ref 1.15–1.40)
Chloride: 106 mmol/L (ref 101–111)
Glucose, Bld: 83 mg/dL (ref 65–99)
HEMATOCRIT: 33 % — AB (ref 39.0–52.0)
HEMOGLOBIN: 11.2 g/dL — AB (ref 13.0–17.0)
POTASSIUM: 4.3 mmol/L (ref 3.5–5.1)
SODIUM: 140 mmol/L (ref 135–145)
TCO2: 26 mmol/L (ref 0–100)

## 2016-09-15 LAB — CBC
HEMATOCRIT: 35 % — AB (ref 39.0–52.0)
Hemoglobin: 10.7 g/dL — ABNORMAL LOW (ref 13.0–17.0)
MCH: 23.4 pg — ABNORMAL LOW (ref 26.0–34.0)
MCHC: 30.6 g/dL (ref 30.0–36.0)
MCV: 76.6 fL — AB (ref 78.0–100.0)
Platelets: 331 10*3/uL (ref 150–400)
RBC: 4.57 MIL/uL (ref 4.22–5.81)
RDW: 18 % — AB (ref 11.5–15.5)
WBC: 11.7 10*3/uL — AB (ref 4.0–10.5)

## 2016-09-15 LAB — POCT ACTIVATED CLOTTING TIME
ACTIVATED CLOTTING TIME: 208 s
ACTIVATED CLOTTING TIME: 169 s
Activated Clotting Time: 191 seconds
Activated Clotting Time: 197 seconds

## 2016-09-15 LAB — CREATININE, SERUM
Creatinine, Ser: 1.08 mg/dL (ref 0.61–1.24)
GFR calc non Af Amer: >60 mL/min (ref >60)

## 2016-09-15 SURGERY — LOWER EXTREMITY ANGIOGRAPHY
Anesthesia: LOCAL | Laterality: Left

## 2016-09-15 MED ORDER — SILVER SULFADIAZINE 1 % EX CREA
1.0000 "application " | TOPICAL_CREAM | Freq: Every day | CUTANEOUS | Status: DC
  Administered 2016-09-15 – 2016-09-16 (×2): 1 via TOPICAL
  Filled 2016-09-15: qty 85

## 2016-09-15 MED ORDER — CLOPIDOGREL BISULFATE 300 MG PO TABS
300.0000 mg | ORAL_TABLET | Freq: Once | ORAL | Status: AC
  Administered 2016-09-15: 300 mg via ORAL
  Filled 2016-09-15: qty 1

## 2016-09-15 MED ORDER — NITROGLYCERIN 0.2 MG/HR TD PT24
0.2000 mg | MEDICATED_PATCH | Freq: Every day | TRANSDERMAL | Status: DC
  Administered 2016-09-15 – 2016-09-16 (×2): 0.2 mg via TRANSDERMAL
  Filled 2016-09-15 (×2): qty 1

## 2016-09-15 MED ORDER — GABAPENTIN 100 MG PO CAPS
200.0000 mg | ORAL_CAPSULE | Freq: Every day | ORAL | Status: DC
  Administered 2016-09-15: 22:00:00 200 mg via ORAL
  Filled 2016-09-15: qty 2

## 2016-09-15 MED ORDER — MELOXICAM 7.5 MG PO TABS
7.5000 mg | ORAL_TABLET | Freq: Every day | ORAL | Status: DC
  Administered 2016-09-16: 7.5 mg via ORAL
  Filled 2016-09-15 (×2): qty 1

## 2016-09-15 MED ORDER — SODIUM CHLORIDE 0.9 % IV SOLN
INTRAVENOUS | Status: DC
  Administered 2016-09-15: 10:00:00 via INTRAVENOUS

## 2016-09-15 MED ORDER — DOXYCYCLINE HYCLATE 100 MG PO TABS
100.0000 mg | ORAL_TABLET | Freq: Two times a day (BID) | ORAL | Status: DC
  Filled 2016-09-15: qty 1

## 2016-09-15 MED ORDER — MIDAZOLAM HCL 2 MG/2ML IJ SOLN
INTRAMUSCULAR | Status: DC | PRN
  Administered 2016-09-15: 1 mg via INTRAVENOUS

## 2016-09-15 MED ORDER — LIDOCAINE HCL (PF) 1 % IJ SOLN
INTRAMUSCULAR | Status: AC
  Filled 2016-09-15: qty 30

## 2016-09-15 MED ORDER — ACETAMINOPHEN 325 MG PO TABS: 650.0000 mg | ORAL_TABLET | ORAL | Status: DC | PRN

## 2016-09-15 MED ORDER — METHOCARBAMOL 500 MG PO TABS: 500.0000 mg | ORAL_TABLET | Freq: Four times a day (QID) | ORAL | Status: DC | PRN

## 2016-09-15 MED ORDER — ALBUTEROL SULFATE (2.5 MG/3ML) 0.083% IN NEBU
2.5000 mg | INHALATION_SOLUTION | Freq: Two times a day (BID) | RESPIRATORY_TRACT | Status: DC
  Administered 2016-09-15 – 2016-09-16 (×2): 2.5 mg via RESPIRATORY_TRACT
  Filled 2016-09-15 (×2): qty 3

## 2016-09-15 MED ORDER — VITAMIN D3 25 MCG (1000 UNIT) PO TABS
1000.0000 [IU] | ORAL_TABLET | Freq: Every day | ORAL | Status: DC
  Administered 2016-09-16: 1000 [IU] via ORAL
  Filled 2016-09-15 (×4): qty 1

## 2016-09-15 MED ORDER — FENTANYL CITRATE (PF) 100 MCG/2ML IJ SOLN
INTRAMUSCULAR | Status: AC
  Filled 2016-09-15: qty 2

## 2016-09-15 MED ORDER — HEPARIN (PORCINE) IN NACL 2-0.9 UNIT/ML-% IJ SOLN
INTRAMUSCULAR | Status: AC
  Filled 2016-09-15: qty 1000

## 2016-09-15 MED ORDER — DOXYCYCLINE HYCLATE 100 MG PO TABS
100.0000 mg | ORAL_TABLET | Freq: Two times a day (BID) | ORAL | Status: DC
  Administered 2016-09-15 – 2016-09-16 (×2): 100 mg via ORAL
  Filled 2016-09-15 (×4): qty 1

## 2016-09-15 MED ORDER — IODIXANOL 320 MG/ML IV SOLN
INTRAVENOUS | Status: DC | PRN
  Administered 2016-09-15: 200 mL via INTRAVENOUS

## 2016-09-15 MED ORDER — ONDANSETRON HCL 4 MG/2ML IJ SOLN: 4.0000 mg | Freq: Four times a day (QID) | INTRAMUSCULAR | Status: DC | PRN

## 2016-09-15 MED ORDER — OXYCODONE HCL 5 MG PO TABS
5.0000 mg | ORAL_TABLET | ORAL | Status: DC | PRN
  Administered 2016-09-15 – 2016-09-16 (×4): 5 mg via ORAL
  Filled 2016-09-15 (×4): qty 1

## 2016-09-15 MED ORDER — LIDOCAINE HCL (PF) 1 % IJ SOLN
INTRAMUSCULAR | Status: DC | PRN
  Administered 2016-09-15: 2 mL

## 2016-09-15 MED ORDER — HEPARIN SODIUM (PORCINE) 1000 UNIT/ML IJ SOLN
INTRAMUSCULAR | Status: DC | PRN
  Administered 2016-09-15 (×3): 3000 [IU] via INTRAVENOUS
  Administered 2016-09-15: 5000 [IU] via INTRAVENOUS

## 2016-09-15 MED ORDER — SODIUM CHLORIDE 0.9 % IV SOLN
1.0000 mL/kg/h | INTRAVENOUS | Status: AC
  Administered 2016-09-15: 1 mL/kg/h via INTRAVENOUS

## 2016-09-15 MED ORDER — HEPARIN SODIUM (PORCINE) 5000 UNIT/ML IJ SOLN
5000.0000 [IU] | Freq: Three times a day (TID) | INTRAMUSCULAR | Status: DC
  Administered 2016-09-16 (×2): 5000 [IU] via SUBCUTANEOUS
  Filled 2016-09-15 (×2): qty 1

## 2016-09-15 MED ORDER — POLYETHYLENE GLYCOL 3350 17 G PO PACK
17.0000 g | PACK | Freq: Every day | ORAL | Status: DC
  Filled 2016-09-15: qty 1

## 2016-09-15 MED ORDER — ENSURE ENLIVE PO LIQD
237.0000 mL | Freq: Two times a day (BID) | ORAL | Status: DC
  Administered 2016-09-16: 237 mL via ORAL
  Filled 2016-09-15 (×4): qty 237

## 2016-09-15 MED ORDER — DOCUSATE SODIUM 100 MG PO CAPS
100.0000 mg | ORAL_CAPSULE | Freq: Two times a day (BID) | ORAL | Status: DC
  Administered 2016-09-15: 22:00:00 100 mg via ORAL
  Filled 2016-09-15 (×2): qty 1

## 2016-09-15 MED ORDER — HYDRALAZINE HCL 20 MG/ML IJ SOLN
INTRAMUSCULAR | Status: AC
  Filled 2016-09-15: qty 1

## 2016-09-15 MED ORDER — SENNA 8.6 MG PO TABS
1.0000 | ORAL_TABLET | Freq: Two times a day (BID) | ORAL | Status: DC
  Filled 2016-09-15 (×3): qty 1

## 2016-09-15 MED ORDER — MORPHINE SULFATE (PF) 4 MG/ML IV SOLN: 2.0000 mg | INTRAVENOUS | Status: DC | PRN

## 2016-09-15 MED ORDER — FENTANYL CITRATE (PF) 100 MCG/2ML IJ SOLN
INTRAMUSCULAR | Status: DC | PRN
  Administered 2016-09-15: 50 ug via INTRAVENOUS
  Administered 2016-09-15 (×3): 25 ug via INTRAVENOUS

## 2016-09-15 MED ORDER — HEPARIN SODIUM (PORCINE) 1000 UNIT/ML IJ SOLN
INTRAMUSCULAR | Status: AC
  Filled 2016-09-15: qty 1

## 2016-09-15 MED ORDER — PANTOPRAZOLE SODIUM 40 MG PO TBEC
40.0000 mg | DELAYED_RELEASE_TABLET | Freq: Every day | ORAL | Status: DC
  Administered 2016-09-16: 11:00:00 40 mg via ORAL
  Filled 2016-09-15: qty 1

## 2016-09-15 MED ORDER — HYDRALAZINE HCL 20 MG/ML IJ SOLN
10.0000 mg | INTRAMUSCULAR | Status: DC | PRN
  Administered 2016-09-15: 20:00:00 10 mg via INTRAVENOUS

## 2016-09-15 MED ORDER — TRAZODONE HCL 50 MG PO TABS
50.0000 mg | ORAL_TABLET | Freq: Every day | ORAL | Status: DC
  Administered 2016-09-15: 23:00:00 50 mg via ORAL
  Filled 2016-09-15: qty 1

## 2016-09-15 MED ORDER — HEPARIN (PORCINE) IN NACL 2-0.9 UNIT/ML-% IJ SOLN
INTRAMUSCULAR | Status: DC | PRN
  Administered 2016-09-15: 1000 mL

## 2016-09-15 MED ORDER — CLOPIDOGREL BISULFATE 75 MG PO TABS
75.0000 mg | ORAL_TABLET | Freq: Every day | ORAL | Status: DC
  Administered 2016-09-16: 11:00:00 75 mg via ORAL
  Filled 2016-09-15: qty 1

## 2016-09-15 MED ORDER — LEVOTHYROXINE SODIUM 25 MCG PO TABS
12.5000 ug | ORAL_TABLET | Freq: Every day | ORAL | Status: DC
  Administered 2016-09-16: 12.5 ug via ORAL
  Filled 2016-09-15: qty 1

## 2016-09-15 MED ORDER — ALLOPURINOL 100 MG PO TABS
100.0000 mg | ORAL_TABLET | Freq: Every day | ORAL | Status: DC
  Administered 2016-09-16: 11:00:00 100 mg via ORAL
  Filled 2016-09-15 (×2): qty 1

## 2016-09-15 MED ORDER — MIDAZOLAM HCL 2 MG/2ML IJ SOLN
INTRAMUSCULAR | Status: AC
  Filled 2016-09-15: qty 2

## 2016-09-15 SURGICAL SUPPLY — 31 items
BALLN STERLING OTW 3X150X150 (BALLOONS) ×2
BALLN STERLING OTW 5X150X150 (BALLOONS) ×2
BALLN STERLING OTW 5X220X150 (BALLOONS) ×2
BALLOON STERLING OTW 3X150X150 (BALLOONS) ×1 IMPLANT
BALLOON STERLING OTW 5X150X150 (BALLOONS) ×1 IMPLANT
BALLOON STERLING OTW 5X220X150 (BALLOONS) ×1 IMPLANT
CATH CROSS OVER TEMPO 5F (CATHETERS) ×2 IMPLANT
CATH CXI 4F 150 ST (CATHETERS) ×2 IMPLANT
CATH CXI SUPP ST 4FR 90CM (MICROCATHETER) ×2 IMPLANT
CATH NAVICROSS ANGLED 90CM (MICROCATHETER) ×2 IMPLANT
CATH QUICKCROSS .035X135CM (MICROCATHETER) ×2 IMPLANT
COVER PRB 48X5XTLSCP FOLD TPE (BAG) ×1 IMPLANT
COVER PROBE 5X48 (BAG) ×1
DEVICE ONE SNARE 10MM (MISCELLANEOUS) ×2 IMPLANT
GUIDEWIRE ANGLED .035X260CM (WIRE) ×4 IMPLANT
GUIDEWIRE STR TIP .014X300X8 (WIRE) ×2 IMPLANT
KIT ENCORE 26 ADVANTAGE (KITS) ×2 IMPLANT
KIT PV (KITS) ×2 IMPLANT
SHEATH FLEX ANSEL ANG 6F 45CM (SHEATH) ×2 IMPLANT
SHEATH HIGHFLEX ANSEL 6FRX55 (SHEATH) ×2 IMPLANT
SHEATH PINNACLE 5F 10CM (SHEATH) ×2 IMPLANT
STENT PROMUS PREM MR 3.0X38 (Permanent Stent) ×2 IMPLANT
STENT VIABAHN 5X250X120 (Permanent Stent) ×2 IMPLANT
STENT VIABAHN 6X250X120 (Permanent Stent) ×2 IMPLANT
STENT VIABAHN 6X50X120 (Permanent Stent) ×2 IMPLANT
SYR MEDRAD MARK V 150ML (SYRINGE) ×2 IMPLANT
TRANSDUCER W/STOPCOCK (MISCELLANEOUS) ×2 IMPLANT
TRAY PV CATH (CUSTOM PROCEDURE TRAY) ×2 IMPLANT
WIRE BENTSON .035X145CM (WIRE) ×2 IMPLANT
WIRE G V18X300CM (WIRE) ×4 IMPLANT
WIRE MINI STICK MAX (SHEATH) ×2 IMPLANT

## 2016-09-15 NOTE — Op Note (Signed)
Patient name: Caleb Hays MRN: [id number] DOB: 20-Feb-1941 Sex: male  09/15/2016 Pre-operative Diagnosis: critical left lower extremity ischemia Post-operative diagnosis:  Same Surgeon:  Apolinar Junes C. Randie Heinz, MD Procedure Performed: 1.  US guided cannulation of right common femoral artery 2.  Left lower extremity angiogram 3.  US guided cannulation of left anterior tibial artery 4.  Stent of left sfa and popliteal artery with 6 x 50mm viabahn, 6 x viabahn and 5 x viabahn 5.  Stent of left anterior tibial artery with 3 x 38mm promus premiere balloon expandable 6.  Moderate sedation with fentanyl and versed for 94 minutes  Indications:  76 year old gentleman history of a right-sided amputation with recent aortogram and left lower extremity runoff for wounds on his left foot. She was indicated for the above procedure.   Findings: There is a patent SFA for approximately 2 cm after the common femoral artery on the left. Profunda was the dominant runoff on the left leg. At the level of the ankle there was a patent anterior tibial artery was circumferentially calcified. The entirety of the SFA and popliteal arteries were occluded. Following stenting there was no further disease in the SFA or popliteal arteries. After balloon angioplasty of the anterior tibial artery there was a dissection flap that was stented with no further dissection at that area but a more distal dissection that was non-flow-limiting. This was not intervened upon. At completion there was runoff via the ATV to the DP filling the foot in line.   Procedure:  The patient was identified in the holding area and taken to room 8.  The patient was then placed supine on the table and prepped and draped in the usual sterile fashion.  A time out was called.  Ultrasound was used to evaluate the right common femoral artery.  It was patent .  A digital ultrasound image was acquired.  A micropuncture needle was used to access the right  common femoral artery under ultrasound guidance.  An 018 wire was advanced without resistance and a micropuncture sheath was placed.  The 018 wire was removed and a benson wire was placed.  The micropuncture sheath was exchanged for a 5 french sheath.  We used an upper catheter to cross the bifurcation and placed a long 6 French sheath into the left common femoral artery. Angiography of the left lower extremity was performed. We then used a Glidewire to attempt to cross the occluded SFA. This was initially impassable. We then used the back of the Glidewire to perforate the takeoff and then using subintimal technique were able to get to the level of the below-knee popliteal artery with a Glidewire and a loop configuration. This time we elected to cannulate the anterior tibial artery and retrograde fashion. Ultrasound guidance was used to identify the anterior tibial artery at the level of the ankle which was noted to be circumferentially calcified. This was cannulated with 21-gauge needle followed by micropuncture sheath being placed. I then used an 035 Glidewire and retrograde fashion was able to cross from the anterior tibial into the popliteal artery. Using an 035 CXI catheter and got into the SFA with the 2 wires from above and below did appear to touch. I then snared the wire from below and put through and a body floss technique. This was then exchanged with long catheter for V 18 wire. We then performed balloon angioplasty of the entire SFA popliteal and beginning of the anterior tibial artery with long 3  mm balloon. We then primarily stented with viabahn distally which was 5 mm x 250 m. This was then extended further back with 6 x 250 mm viabahn and then completed with 6 x 55 bound to the takeoff of the SFA. We again then ballooned the anterior tibial artery with a 3 mm balloon and performed angiography which demonstrated runoff via the anterior tibial artery but with flow-limiting dissection near its  initiation point. A long inflation was performed with 3 mm balloon but it persisted and so was then stented with 3 x 38 balloon expandable drug-eluting stent. Following this we performed angiography demonstrating a dissection further down the anterior tibial artery which again was ballooned with long inflation time was resistant and was noted to be non-flow-limiting and so was left. Satisfied with our result with runoff to the foot on completion angiogram we removed the catheter wire and the sheath was retracted into the right external iliac artery where we pulled in the holding area. Patient did tolerate the procedure well without immediate complication.  Contrast: 200cc  Yarlin Breisch C. Randie Heinzain, MD [facility] [place] Office: [phone] Pager: [phone]

## 2016-09-15 NOTE — H&P (Signed)
HPI: Caleb Hays is a 76 y.o. male seen today for evaluation of gangrenous changes of her left foot. He had been scheduled to follow-up with Dr. Darrick [name] but was concerned regarding the progression of this and is seeing me today on an urgent basis. Is very complex past history. He did undergo formal arteriogram in November 2015. 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 He did have ulceration on his right toes which eventually healed with conservative treatment. 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 He is currently in a wheelchair. He reports that he is able to transfer bed to chair but never really walked with the prosthesis. Does have pain associated with the changes in his left foot. He has undergone a left second toe amputation with poor healing. He does have a dry gangrenous changes on the tips of his first and third toe.      Past Medical History:  Diagnosis Date  . Adult failure to thrive    /notes 06/23/2015  . Anemia   . Arthritis    "hands" (06/23/2015)  . COPD (chronic obstructive pulmonary disease) (HCC)    LONG TIME SMOKER  . Critical lower limb ischemia   . 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 Depression    "periods of depression" (06/23/2015)  . Gout   . Physical deconditioning   . Pyelonephritis 06/23/2015  . Status post foot surgery    right fifth toe amputation by Dr. Lajoyce Corners          Family History  Problem Relation Age of Onset  . Heart failure Father   . Heart attack Father   . Heart failure Brother     SOCIAL HISTORY:        Social History  Substance Use Topics  . Smoking status: Former Smoker    Packs/day: 0.25    Years: 57.00    Types: Cigarettes  . Smokeless tobacco: Never Used  . Alcohol use Yes      Comment: 06/23/2015 "was drinking  alot of beer; quit in 2015" 09/30/15 - no longer drinks    No Known Allergies        Current Outpatient Prescriptions  Medication Sig Dispense Refill  . allopurinol (ZYLOPRIM) 100 MG tablet Take 100 mg by mouth daily.     . cholecalciferol (VITAMIN D) 1000 units tablet Take 1,000 Units by mouth daily. Reported on 11/06/2015    . docusate sodium (COLACE) 100 MG capsule Take 100 mg by mouth 2 (two) times daily.    . famotidine (PEPCID) 20 MG tablet Take 20 mg by mouth daily. For GERD.    Marland Kitchen levothyroxine (SYNTHROID, LEVOTHROID) 25 MCG tablet Take 25 mcg by mouth daily before breakfast.     . meloxicam (MOBIC) 7.5 MG tablet Take 7.5 mg by mouth daily.     . Multiple Vitamins-Minerals (MULTIVITAMIN WITH MINERALS) tablet Take 1 tablet by mouth daily.    Marland Kitchen omeprazole (PRILOSEC) 20 MG capsule Take 20 mg by mouth daily.     Marland Kitchen senna (SENOKOT) 8.6 MG TABS tablet Take 1 tablet (8.6 mg total) by mouth 2 (two) times daily. 30 each 0  . albuterol (PROVENTIL HFA;VENTOLIN HFA) 108 (90 BASE) MCG/ACT inhaler Inhale 2 puffs into the lungs 2 (two) times daily. Take every daily per niece    . doxycycline (VIBRA-TABS) 100 MG tablet Take  1 tablet (100 mg total) by mouth 2 (two) times daily. (Patient not taking: Reported on 08/31/2016) 60 tablet 0  . loperamide (IMODIUM A-D) 2 MG tablet Give 1 - 2 tablets ( 2mg  - 4mg ) after initial dose following  each loose stool following. Do not give if < 3 stools or Dx c.diff or norovirus. So do not exceed > 16 mg/24 hr period.    . methocarbamol (ROBAXIN) 500 MG tablet Take 500 mg by mouth every 6 (six) hours as needed for muscle spasms.    . nitroGLYCERIN (NITRODUR - DOSED IN MG/24 HR) 0.2 mg/hr patch Place 1 patch (0.2 mg total) onto the skin daily. Rotate site along dorsum of foot daily. (Patient not taking: Reported on 08/31/2016) 30 patch 2  . oxyCODONE (OXY IR/ROXICODONE) 5 MG immediate release tablet Take one tablet by mouth three times daily for pain;  Take one tablet by mouth every 4 hours as needed for moderate or severe pain 270 tablet 0  . traZODone (DESYREL) 50 MG tablet Take 50 mg by mouth at bedtime.      No current facility-administered medications for this visit.     REVIEW OF SYSTEMS:  [X]  denotes positive finding, [ ]  denotes negative finding Cardiac  Comments:  Chest pain or chest pressure:    Shortness of breath upon exertion:    Short of breath when lying flat:    Irregular heart rhythm:        Vascular    Pain in calf, thigh, or hip brought on by ambulation:    Pain in feet at night that wakes you up from your sleep:  x   Blood clot in your veins:    Leg swelling:           PHYSICAL EXAM:    Vitals:   08/31/16 0857  BP: (!) 104/56  Pulse: 62  Resp: 18  Temp: 97.4 F (36.3 C)  TempSrc: Oral  SpO2: 96%  Weight: 158 lb (71.7 kg)  Height: 6\' 4"  (1.93 m)    GENERAL: The patient is a well-nourished male, in no acute distress. The vital signs are documented above. CARDIOVASCULAR: Rapid arteries without bruits bilaterally. 2+ radial and 2+ femoral pulses. Absent popliteal pulses and no pulses in his left foot PULMONARY: There is good air exchange  MUSCULOSKELETAL: Right below-knee amputation NEUROLOGIC: No focal weakness or paresthesias are detected. SKIN: Gangrene of the tips of the first and third toe on the left and an open poorly healing second toe amputation PSYCHIATRIC: The patient has a normal affect.  DATA:  Noninvasive studies reveal calcified vessels and monophasic flow at the dorsalis pedis and posterior tibial on his left foot  HPI: Caleb Hays is a 76 y.o. male seen today for evaluation of gangrenous changes of her left foot. He had been scheduled to follow-up with Dr. Darrick [name] but was concerned regarding the progression of this and is seeing me today on an urgent basis. Is very complex past history. He did undergo formal arteriogram in November 2015. 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 He did have ulceration on his right toes which eventually healed with conservative treatment. 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 He is currently in a wheelchair. He reports that he is able to transfer bed to chair but never really walked with the prosthesis. Does have pain associated with the changes in his  left foot. He has undergone a left second toe amputation with poor healing. He does have a dry gangrenous changes on the tips of his first and third toe.      Past Medical History:  Diagnosis Date  . Adult failure to thrive    /notes 06/23/2015  . Anemia   . Arthritis    "hands" (06/23/2015)  . COPD (chronic obstructive pulmonary disease) (HCC)    LONG TIME SMOKER  . Critical lower limb ischemia   . 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 Depression    "periods of depression" (06/23/2015)  . Gout   . Physical deconditioning   . Pyelonephritis 06/23/2015  . Status post foot surgery    right fifth toe amputation by Dr. Lajoyce Corners          Family History  Problem Relation Age of Onset  . Heart failure Father   . Heart attack Father   . Heart failure Brother     SOCIAL HISTORY:        Social History  Substance Use Topics  . Smoking status: Former Smoker    Packs/day: 0.25    Years: 57.00    Types: Cigarettes  . Smokeless tobacco: Never Used  . Alcohol use Yes      Comment: 06/23/2015 "was drinking alot of beer; quit in 2015" 09/30/15 - no longer drinks    No Known Allergies        Current Outpatient Prescriptions  Medication Sig Dispense Refill  . allopurinol (ZYLOPRIM) 100 MG tablet Take 100 mg by mouth daily.     . cholecalciferol (VITAMIN D) 1000 units tablet Take 1,000 Units by mouth daily. Reported on 11/06/2015    .  docusate sodium (COLACE) 100 MG capsule Take 100 mg by mouth 2 (two) times daily.    . famotidine (PEPCID) 20 MG tablet Take 20 mg by mouth daily. For GERD.    Marland Kitchen levothyroxine (SYNTHROID, LEVOTHROID) 25 MCG tablet Take 25 mcg by mouth daily before breakfast.     . meloxicam (MOBIC) 7.5 MG tablet Take 7.5 mg by mouth daily.     . Multiple Vitamins-Minerals (MULTIVITAMIN WITH MINERALS) tablet Take 1 tablet by mouth daily.    Marland Kitchen omeprazole (PRILOSEC) 20 MG capsule Take 20 mg by mouth daily.     Marland Kitchen senna (SENOKOT) 8.6 MG TABS tablet Take 1 tablet (8.6 mg total) by mouth 2 (two) times daily. 30 each 0  . albuterol (PROVENTIL HFA;VENTOLIN HFA) 108 (90 BASE) MCG/ACT inhaler Inhale 2 puffs into the lungs 2 (two) times daily. Take every daily per niece    . doxycycline (VIBRA-TABS) 100 MG tablet Take 1 tablet (100 mg total) by mouth 2 (two) times daily. (Patient not taking: Reported on 08/31/2016) 60 tablet 0  . loperamide (IMODIUM A-D) 2 MG tablet Give 1 - 2 tablets ( 2mg  - 4mg ) after initial dose following  each loose stool following. Do not give if < 3 stools or Dx c.diff or norovirus. So do not exceed > 16 mg/24 hr period.    . methocarbamol (ROBAXIN) 500 MG tablet Take 500 mg by mouth every 6 (six) hours as needed for muscle spasms.    . nitroGLYCERIN (NITRODUR - DOSED IN MG/24 HR) 0.2 mg/hr patch Place 1 patch (0.2 mg total) onto the skin daily. Rotate site along dorsum of foot daily. (Patient not taking: Reported on 08/31/2016) 30 patch 2  . oxyCODONE (OXY IR/ROXICODONE) 5 MG immediate release  tablet Take one tablet by mouth three times daily for pain; Take one tablet by mouth every 4 hours as needed for moderate or severe pain 270 tablet 0  . traZODone (DESYREL) 50 MG tablet Take 50 mg by mouth at bedtime.      No current facility-administered medications for this visit.     REVIEW OF SYSTEMS:  [X]  denotes positive finding, [ ]  denotes negative finding Cardiac  Comments:    Chest pain or chest pressure:    Shortness of breath upon exertion:    Short of breath when lying flat:    Irregular heart rhythm:        Vascular    Pain in calf, thigh, or hip brought on by ambulation:    Pain in feet at night that wakes you up from your sleep:  x   Blood clot in your veins:    Leg swelling:           PHYSICAL EXAM:    Vitals:   08/31/16 0857  BP: (!) 104/56  Pulse: 62  Resp: 18  Temp: 97.4 F (36.3 C)  TempSrc: Oral  SpO2: 96%  Weight: 158 lb (71.7 kg)  Height: 6\' 4"  (1.93 m)    GENERAL: The patient is a well-nourished male, in no acute distress. The vital signs are documented above. CARDIOVASCULAR: Rapid arteries without bruits bilaterally. 2+ radial and 2+ femoral pulses. Absent popliteal pulses and no pulses in his left foot PULMONARY: There is good air exchange  MUSCULOSKELETAL: Right below-knee amputation NEUROLOGIC: No focal weakness or paresthesias are detected. SKIN: Gangrene of the tips of the first and third toe on the left and an open poorly healing second toe amputation PSYCHIATRIC: The patient has a normal affect.  DATA:  Noninvasive studies reveal calcified vessels and monophasic flow at the dorsalis pedis and posterior tibial on his left foot  A/P: 76 year old male with nonhealing ulcer of left foot. He has a previous right amputation. Recently underwent angiogram and now will have repeat angiogram with possible retrograde access. We discussed risk and benefits patient with good understanding and will proceed today.  Harshan Kearley C. Randie Heinzain, MD [facility] [place] Office: [phone] Pager: [phone]

## 2016-09-15 NOTE — Progress Notes (Signed)
Site area: R groin  Site Prior to Removal:  Level 0  Pressure Applied For 25 MINUTES    Minutes Beginning at 20:30  Manual:   Yes  Patient Status During Pull: Stable  Post Pull Groin Site:  Level  0   Post Pull Instructions Given: Yes  Post Pull Pulses Present:  Yes  Dressing Applied:  Yes   Comments: Pt tolerated procedure well VSS

## 2016-09-16 ENCOUNTER — Encounter (HOSPITAL_COMMUNITY): Payer: Self-pay | Admitting: Vascular Surgery

## 2016-09-16 DIAGNOSIS — I70262 Atherosclerosis of native arteries of extremities with gangrene, left leg: Secondary | ICD-10-CM | POA: Diagnosis not present

## 2016-09-16 LAB — CBC
HEMATOCRIT: 32.4 % — AB (ref 39.0–52.0)
HEMOGLOBIN: 10 g/dL — AB (ref 13.0–17.0)
MCH: 23.3 pg — ABNORMAL LOW (ref 26.0–34.0)
MCHC: 30.9 g/dL (ref 30.0–36.0)
MCV: 75.3 fL — AB (ref 78.0–100.0)
Platelets: 325 10*3/uL (ref 150–400)
RBC: 4.3 MIL/uL (ref 4.22–5.81)
RDW: 18.1 % — ABNORMAL HIGH (ref 11.5–15.5)
WBC: 9.5 10*3/uL (ref 4.0–10.5)

## 2016-09-16 LAB — BASIC METABOLIC PANEL
ANION GAP: 6 (ref 5–15)
BUN: 19 mg/dL (ref 6–20)
CALCIUM: 8.6 mg/dL — AB (ref 8.9–10.3)
CHLORIDE: 109 mmol/L (ref 101–111)
CO2: 24 mmol/L (ref 22–32)
Creatinine, Ser: 1.22 mg/dL (ref 0.61–1.24)
GFR calc Af Amer: >60 mL/min (ref >60)
GFR calc non Af Amer: 57 mL/min — ABNORMAL LOW (ref >60)
GLUCOSE: 93 mg/dL (ref 65–99)
Potassium: 4.7 mmol/L (ref 3.5–5.1)
Sodium: 139 mmol/L (ref 135–145)

## 2016-09-16 LAB — MRSA PCR SCREENING: MRSA by PCR: POSITIVE — AB

## 2016-09-16 MED ORDER — CLOPIDOGREL BISULFATE 75 MG PO TABS: 75.0000 mg | ORAL_TABLET | Freq: Every day | ORAL | 6 refills | Status: AC

## 2016-09-16 MED ORDER — OXYCODONE HCL 5 MG PO TABS: 5.0000 mg | ORAL_TABLET | ORAL | 0 refills | Status: DC | PRN

## 2016-09-16 MED ORDER — CHLORHEXIDINE GLUCONATE CLOTH 2 % EX PADS
6.0000 | MEDICATED_PAD | Freq: Every day | CUTANEOUS | Status: DC
  Administered 2016-09-16: 6 via TOPICAL

## 2016-09-16 MED ORDER — MUPIROCIN 2 % EX OINT
1.0000 "application " | TOPICAL_OINTMENT | Freq: Two times a day (BID) | CUTANEOUS | Status: DC
  Administered 2016-09-16: 1 via NASAL
  Filled 2016-09-16: qty 22

## 2016-09-16 NOTE — Progress Notes (Signed)
  [facility] Progress Note  Subjective  - POD #1  No complaints this am.   Objective Vitals:   09/16/16 0400 09/16/16 0825  BP:  117/60  Pulse:  75  Resp:  18  Temp: 97.4 F (36.3 C) 97.6 F (36.4 C)    Intake/Output Summary (Last 24 hours) at 09/16/16 0859 Last data filed at 09/16/16 [id number]  Gross per 24 hour  Intake              240 ml  Output              700 ml  Net             -460 ml   Right groin without hematoma. Dry gangrenous changes to left foot. Left foot is warm.   Assessment/Planning: 76 y.o. male is s/p:  1.  US guided cannulation of right common femoral artery 2.  Left lower extremity angiogram 3.  US guided cannulation of left anterior tibial artery 4.  Stent of left sfa and popliteal artery with 6 x 50mm viabahn, 6 x 250mm viabahn and 5 x 250mm viabahn 5.  Stent of left anterior tibial artery with 3 x 38mm promus premiere balloon expandable 6.  Moderate sedation with fentanyl and versed for 94 minutes 1 Day Post-Op   Will d/c today on Plavix. Patient will follow-up in a couple weeks with Dr. Lajoyce Cornersuda regarding left foot.  Follow-up with Dr. Randie Heinzain in 4 weeks with duplex and ABIs.  CSW for transfer back to [facility].   Raymond GurneyKimberly A Trinh 09/16/2016 8:59 AM --  Laboratory CBC    Component Value Date/Time   WBC 9.5 09/16/2016 0206   HGB 10.0 (L) 09/16/2016 0206   HCT 32.4 (L) 09/16/2016 0206   PLT 325 09/16/2016 0206    BMET    Component Value Date/Time   NA 139 09/16/2016 0206   NA 140 08/02/2016   K 4.7 09/16/2016 0206   CL 109 09/16/2016 0206   CO2 24 09/16/2016 0206   GLUCOSE 93 09/16/2016 0206   BUN 19 09/16/2016 0206   BUN 30 (A) 08/02/2016   CREATININE 1.22 09/16/2016 0206   CREATININE 0.92 05/22/2014 1437   CALCIUM 8.6 (L) 09/16/2016 0206   GFRNONAA 57 (L) 09/16/2016 0206   GFRAA >60 09/16/2016 0206    COAG Lab Results  Component Value Date   INR 1.26 10/01/2015   INR 1.27 08/13/2015   INR 1.14 05/22/2014     No results found for: PTT  Antibiotics Anti-infectives    Start     Dose/Rate Route Frequency Ordered Stop   09/15/16 2200  doxycycline (VIBRA-TABS) tablet 100 mg  Status:  Discontinued     100 mg Oral 2 times daily 09/15/16 1728 09/15/16 1941   09/15/16 2000  doxycycline (VIBRA-TABS) tablet 100 mg     100 mg Oral 2 times daily 09/15/16 1941         Maris BergerKimberly Trinh, PA-C [facility] Office: [phone] Pager: [phone] 09/16/2016 8:59 AM

## 2016-09-16 NOTE — Progress Notes (Signed)
Offered Pt a bath. Pt stated that he did not want a bath at this time.

## 2016-09-16 NOTE — Care Management Note (Signed)
Case Management Note  Patient Details  Name: Ashley MarinerJerald J Risinger MRN: [id number] Date of Birth: 1941-07-02  Subjective/Objective:   From [facility], s/p pv intervention, will be on plavix, CSW consult , NCM will cont to follow.                 Action/Plan:   Expected Discharge Date:                  Expected Discharge Plan:  Skilled Nursing Facility  In-House Referral:  Clinical Social Work  Discharge planning Services  CM Consult  Post Acute Care Choice:    Choice offered to:     DME Arranged:    DME Agency:     HH Arranged:    HH Agency:     Status of Service:  In process, will continue to follow  If discussed at Long Length of Stay Meetings, dates discussed:    Additional Comments:  Leone Havenaylor, Chase Knebel Clinton, RN 09/16/2016, 6:37 AM

## 2016-09-16 NOTE — Clinical Social Work Note (Signed)
Clinical Social Work Assessment  Patient Details  Name: Caleb Hays MRN: [id number] Date of Birth: 06-Jun-1941  Date of referral:  09/16/16               Reason for consult:  Facility Placement                Permission sought to share information with:  Other (Patient did not wish to share information with family supports) Permission granted to share information::  No (Patient did not wish to share information with family supports)  Name::        Agency::     Relationship::     Contact Information:     Housing/Transportation Living arrangements for the past 2 months:  Skilled Nursing Facility Source of Information:  Patient Patient Interpreter Needed:  None Criminal Activity/Legal Involvement Pertinent to Current Situation/Hospitalization:  No - Comment as needed Significant Relationships:    Lives with:  Facility Resident Do you feel safe going back to the place where you live?  Yes Need for family participation in patient care:  No (Coment)  Care giving concerns:  Patient is agreeable to return to [facility] after hospital discharge.    Social Worker assessment / plan:  CSW Intern spoke with patient about discharge plans.  Patient was alert, sitting up in bed engaged in conversation.  Caleb Hays stated that although he does not like the facility that that he is currently at, he is agreeable to return there after hospital discharge.  Patient said that he would not like CSW Intern to contact family members regarding his discharge at this time. Patient thanked English as a second language teacherCSW Intern for assisting with discharge back [facility].     Employment status:  Retired Database administratornsurance information:  Managed Medicare PT Recommendations:  Not assessed at this time Information / Referral to community resources:     Patient/Family's Response to care:  Patient expressed no concerns regarding hospital stay.  Patient/Family's Understanding of and Emotional Response to Diagnosis, Current Treatment, and  Prognosis:  Not discussed  Emotional Assessment Appearance:  Appears stated age Attitude/Demeanor/Rapport:  Other (Appropriate) Affect (typically observed):  Appropriate Orientation:  Oriented to Self, Oriented to Place, Oriented to  Time, Oriented to Situation Alcohol / Substance use:  Tobacco Use Psych involvement (Current and /or in the community):  No (Comment)  Discharge Needs  Concerns to be addressed:  Discharge Planning Concerns Readmission within the last 30 days:  No Current discharge risk:  None Barriers to Discharge:  No Barriers Identified   Renard HamperLecretia Patrina Andreas, Student-Social Work 09/16/2016, 1:58 PM

## 2016-09-16 NOTE — Care Management Note (Signed)
Case Management Note  Patient Details  Name: Caleb Hays MRN: [id number] Date of Birth: February 20, 1941  Subjective/Objective:    From [facility], s/p pv intervention, will be on plavix, CSW consult , for dc today.                 Action/Plan:   Expected Discharge Date:  09/16/16               Expected Discharge Plan:  Skilled Nursing Facility  In-House Referral:  Clinical Social Work  Discharge planning Services  CM Consult  Post Acute Care Choice:    Choice offered to:     DME Arranged:    DME Agency:     HH Arranged:    HH Agency:     Status of Service:  Completed, signed off  If discussed at [employer] Length of [employer] Meetings, dates discussed:    Additional Comments:  Leone Havenaylor, Taejon Irani Clinton, RN 09/16/2016, 9:24 AM

## 2016-09-16 NOTE — Progress Notes (Cosign Needed)
Patient is medically stable for discharge and [facility]  CSW Intern signing off as no other social work needs identified.     Renard HamperLecretia Vianna Venezia, CSW Intern Social Work

## 2016-09-16 NOTE — [place] FL2 (Signed)
[place] MEDICAID FL2 LEVEL OF CARE SCREENING TOOL     IDENTIFICATION  Patient Name: Caleb Hays Birthdate: Sep 04, 1940 Sex: male Admission Date (Current Location): 09/15/2016  [facility] [place]   Facility and Address:  [facility], [address], [place], [place] [address]      Provider Number: [id number]  Attending Physician Name and Address:  Maeola HarmanBrandon Christopher Cain*  Relative Name and Phone Number:  Bing MatterLauren [name] - Neice - [phone]    Current Level of Care: Hospital Recommended Level of Care: Skilled Nursing Facility Prior Approval Number:    Date Approved/Denied:   PASRR Number: [id number] [id number]  Discharge Plan: SNF    Current Diagnoses: Patient Active Problem List   Diagnosis Date Noted  . PAD (peripheral artery disease) (HCC) 09/15/2016  . Hx of amputation of lesser toe, left (HCC) 08/06/2016  . Gangrene of toe of left foot (HCC)   . Toe infection 07/22/2016  . Osteomyelitis (HCC) 07/22/2016  . Skin ulcer of second toe of left foot (HCC) 07/01/2016  . Edema 05/05/2016  . Phantom pain 03/08/2016  . 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 Gout 03/08/2016  . Hypothyroidism (acquired) 03/08/2016  . CKD (chronic kidney disease) stage 3, GFR 30-59 ml/min 03/08/2016  . GERD without esophagitis 03/08/2016  . Constipation 12/29/2015  . Below knee amputation status (HCC) 10/01/2015  . Ankle pain 08/13/2015  . Adult failure to thrive   . PVD (peripheral vascular disease) (HCC)   . Acute kidney injury superimposed on chronic kidney disease (HCC) 08/10/2015  . Elevated blood uric acid level 08/10/2015  . 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 Anemia of chronic disease 08/10/2015  . Malnutrition of moderate degree 06/24/2015  . UTI (urinary tract infection) 06/23/2015  . Failure to thrive in adult 06/23/2015  . Weight loss 06/23/2015  . Tobacco abuse 06/23/2015  . COPD (chronic obstructive  pulmonary disease) (HCC) 06/23/2015  . Fall   . Critical lower limb ischemia 05/31/2014  . Status post right foot surgery 03/12/2014    Orientation RESPIRATION BLADDER Height & Weight     Self, Time, Situation  Normal Continent Weight: 70.3 kg (155 lb) Height:  6\' 4"  (193 cm)  BEHAVIORAL SYMPTOMS/MOOD NEUROLOGICAL BOWEL NUTRITION STATUS      Continent Diet (Heart Healthy)  AMBULATORY STATUS COMMUNICATION OF NEEDS Skin   Limited Assist Verbally Normal                       Personal Care Assistance Level of Assistance  Bathing, Feeding, Dressing Bathing Assistance: Limited assistance Feeding assistance: Independent Dressing Assistance: Limited assistance     Functional Limitations Info  Sight, Hearing, Speech Sight Info: Adequate Hearing Info: Adequate Speech Info: Adequate    SPECIAL CARE FACTORS FREQUENCY                       Contractures Contractures Info: Present    Additional Factors Info  Code Status, Allergies Code Status Info: Full Code Allergies Info: No Known Allergies           Current Medications (09/16/2016):  This is the current hospital active medication list Current Facility-Administered Medications  Medication Dose Route Frequency Provider Last Rate Last Dose  . acetaminophen (TYLENOL) tablet 650 mg  650 mg Oral Q4H PRN Maeola HarmanBrandon Christopher Cain, MD      . albuterol (PROVENTIL) (2.5 MG/3ML) 0.083% nebulizer solution 2.5 mg  2.5 mg Inhalation BID Maeola Harman, MD   2.5 mg at 09/16/16 0818  . allopurinol (ZYLOPRIM) tablet 100 mg  100 mg Oral Daily Maeola Harman, MD   100 mg at 09/16/16 1056  . Chlorhexidine Gluconate Cloth 2 % PADS 6 each  6 each Topical Q0600 Maeola Harman, MD   6 each at 09/16/16 [phone]  . cholecalciferol (VITAMIN D) tablet 1,000 Units  1,000 Units Oral Daily Maeola Harman, MD   1,000 Units at 09/16/16 1056  . clopidogrel (PLAVIX) tablet 75 mg  75 mg Oral Q breakfast Maeola Harman, MD   75 mg at 09/16/16 1058  . docusate sodium (COLACE) capsule 100 mg  100 mg Oral BID Maeola Harman, MD   100 mg at 09/15/16 2209  . doxycycline (VIBRA-TABS) tablet 100 mg  100 mg Oral BID Cherre Huger, RPH   100 mg at 09/16/16 1100  . feeding supplement (ENSURE ENLIVE) (ENSURE ENLIVE) liquid 237 mL  237 mL Oral BID BM Maeola Harman, MD   237 mL at 09/16/16 1100  . gabapentin (NEURONTIN) capsule 200 mg  200 mg Oral QHS Maeola Harman, MD   200 mg at 09/15/16 2205  . heparin injection 5,000 Units  5,000 Units Subcutaneous Q8H Maeola Harman, MD   5,000 Units at 09/16/16 [phone]  . hydrALAZINE (APRESOLINE) injection 10 mg  10 mg Intravenous Q2H PRN Nada Libman, MD   10 mg at 09/15/16 2015  . levothyroxine (SYNTHROID, LEVOTHROID) tablet 12.5 mcg  12.5 mcg Oral QAC breakfast Maeola Harman, MD   12.5 mcg at 09/16/16 [phone]  . meloxicam (MOBIC) tablet 7.5 mg  7.5 mg Oral Daily Maeola Harman, MD   7.5 mg at 09/16/16 1100  . methocarbamol (ROBAXIN) tablet 500 mg  500 mg Oral Q6H PRN Maeola Harman, MD      . morphine 4 MG/ML injection 2 mg  2 mg Intravenous Q1H PRN Maeola Harman, MD      . mupirocin ointment (BACTROBAN) 2 % 1 application  1 application Nasal BID Maeola Harman, MD   1 application at 09/16/16 1059  . nitroGLYCERIN (NITRODUR - Dosed in mg/24 hr) patch 0.2 mg  0.2 mg Transdermal Daily Maeola Harman, MD   0.2 mg at 09/16/16 1059  . ondansetron (ZOFRAN) injection 4 mg  4 mg Intravenous Q6H PRN Maeola Harman, MD      . oxyCODONE (Oxy IR/ROXICODONE) immediate release tablet 5 mg  5 mg Oral Q4H PRN Maeola Harman, MD   5 mg at 09/16/16 1100  . pantoprazole (PROTONIX) EC tablet 40 mg  40 mg Oral Daily Maeola Harman, MD   40 mg at 09/16/16 1100  . polyethylene glycol (MIRALAX / GLYCOLAX) packet 17 g  17 g Oral Daily Maeola Harman,  MD      . senna [facility]) tablet 8.6 mg  1 tablet Oral BID Maeola Harman, MD      . silver sulfADIAZINE (SILVADENE) 1 % cream 1 application  1 application Topical Daily Maeola Harman, MD   1 application at 09/16/16 1100  . traZODone (DESYREL) tablet 50 mg  50 mg Oral QHS Maeola Harman, MD   50 mg at 09/15/16 2258     Discharge Medications: Please see discharge summary for a list of discharge medications.  Relevant Imaging Results:  Relevant Lab Results:   Additional Information SSN: [id number]  Renard Hamper, Student-Social  Work [phone]

## 2016-09-16 NOTE — Discharge Summary (Addendum)
[facility] Discharge Summary  Caleb DuboisJerald J Hays Aug 01, 1940 76 y.o. male  [id number]  Admission Date: 09/15/2016  Discharge Date: 09/16/16  Physician: Maeola HarmanBrandon Christopher Cain*  Admission Diagnosis: PAD (peripheral artery disease) (HCC) [I73.9]  HPI:   This is a 76 y.o. male who was seen for evaluation of gangrenous changes of her left foot. He had been scheduled to follow-up with Dr. Darrick Pennafields but was concerned regarding the progression of this and is seeing me today on an urgent basis. Is very complex past history. He did undergo formal arteriogram in November 2015. 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 He did have ulceration on his right toes which eventually healed with conservative treatment. 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 He is currently in a wheelchair. He reports that he is able to transfer bed to chair but never really walked with the prosthesis. Does have pain associated with the changes in his left foot. He has undergone a left second toe amputation with poor healing. He does have a dry gangrenous changes on the tips of his first and third toe.  Hospital Course:  The patient was admitted to the hospital and taken to the operating room on 09/15/2016 and underwent: 1.  US guided cannulation of right common femoral artery 2.  Left lower extremity angiogram 3.  US guided cannulation of left anterior tibial artery 4.  Stent of left sfa and popliteal artery with 6 x 50mm viabahn, 6 x 250mm viabahn and 5 x 250mm viabahn 5.  Stent of left anterior tibial artery with 3 x 38mm promus premiere balloon expandable 6.  Moderate sedation with fentanyl and versed for 94 minutes    The patient tolerated the procedure well and was transported to the PACU in stable condition.   POD 1: The patient's right groin was soft  without hematoma. He had monophasic left DP and peroneal doppler signals. He had a palpable left popliteal pulse. His creatinine was normal. He was discharged back to SNF on POD 1 in stable condition. He will discharged with Plavix.     CBC    Component Value Date/Time   WBC 9.5 09/16/2016 0206   RBC 4.30 09/16/2016 0206   HGB 10.0 (L) 09/16/2016 0206   HCT 32.4 (L) 09/16/2016 0206   PLT 325 09/16/2016 0206   MCV 75.3 (L) 09/16/2016 0206   MCH 23.3 (L) 09/16/2016 0206   MCHC 30.9 09/16/2016 0206   RDW 18.1 (H) 09/16/2016 0206   LYMPHSABS 1.7 07/22/2016 1626   MONOABS 4.5 (H) 07/22/2016 1626   EOSABS 0.0 07/22/2016 1626   BASOSABS 0.7 (H) 07/22/2016 1626    BMET    Component Value Date/Time   NA 139 09/16/2016 0206   NA 140 08/02/2016   K 4.7 09/16/2016 0206   CL 109 09/16/2016 0206   CO2 24 09/16/2016 0206   GLUCOSE 93 09/16/2016 0206   BUN 19 09/16/2016 0206   BUN 30 (A) 08/02/2016   CREATININE 1.22 09/16/2016 0206   CREATININE 0.92 05/22/2014 1437   CALCIUM 8.6 (L) 09/16/2016 0206   GFRNONAA 57 (L) 09/16/2016 0206   GFRAA >60 09/16/2016 0206     Discharge Instructions:   The patient is discharged to SNF with extensive instructions on wound care and progressive ambulation.  They are instructed not to drive or perform any heavy lifting until returning to see the physician in his  office.  Discharge Instructions    Call MD for:  redness, tenderness, or signs of infection (pain, swelling, bleeding, redness, odor or green/yellow discharge around incision site)    Complete by:  As directed    Call MD for:  severe or increased pain, loss or decreased feeling  in affected limb(s)    Complete by:  As directed    Call MD for:  temperature >100.5    Complete by:  As directed    Discharge wound care:    Complete by:  As directed    Take off right groin dressing tomorrow. Wash left foot daily. Apply silvadene to left foot daily.   Increase activity slowly    Complete by:  As  directed    Walk with assistance use walker or cane as needed   Resume previous diet    Complete by:  As directed       Discharge Diagnosis:  PAD (peripheral artery disease) (HCC) [I73.9]  Secondary Diagnosis: Patient Active Problem List   Diagnosis Date Noted  . PAD (peripheral artery disease) (HCC) 09/15/2016  . Hx of amputation of lesser toe, left (HCC) 08/06/2016  . Gangrene of toe of left foot (HCC)   . Toe infection 07/22/2016  . Osteomyelitis (HCC) 07/22/2016  . Skin ulcer of second toe of left foot (HCC) 07/01/2016  . Edema 05/05/2016  . Phantom pain 03/08/2016  . 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 Gout 03/08/2016  . Hypothyroidism (acquired) 03/08/2016  . CKD (chronic kidney disease) stage 3, GFR 30-59 ml/min 03/08/2016  . GERD without esophagitis 03/08/2016  . Constipation 12/29/2015  . Below knee amputation status (HCC) 10/01/2015  . Ankle pain 08/13/2015  . Adult failure to thrive   . PVD (peripheral vascular disease) (HCC)   . Acute kidney injury superimposed on chronic kidney disease (HCC) 08/10/2015  . Elevated blood uric acid level 08/10/2015  . 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 Anemia of chronic disease 08/10/2015  . Malnutrition of moderate degree 06/24/2015  . UTI (urinary tract infection) 06/23/2015  . Failure to thrive in adult 06/23/2015  . Weight loss 06/23/2015  . Tobacco abuse 06/23/2015  . COPD (chronic obstructive pulmonary disease) (HCC) 06/23/2015  . Fall   . Critical lower limb ischemia 05/31/2014  . Status post right foot surgery 03/12/2014   Past Medical History:  Diagnosis Date  . Adult failure to thrive    /notes 06/23/2015  . Anemia   . Arthritis    "hands" (06/23/2015)  . COPD (chronic obstructive pulmonary disease) (HCC)    LONG TIME SMOKER  . Critical lower limb ischemia   . 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 Depression    "periods of depression" (06/23/2015)    . Gout   . Physical deconditioning   . Pyelonephritis 06/23/2015  . Status post foot surgery    right fifth toe amputation by Dr. Lajoyce Corners      Allergies as of 09/16/2016   No Known Allergies     Medication List    STOP taking these medications   omeprazole 20 MG capsule Commonly known as:  PRILOSEC     TAKE these medications   albuterol 108 (90 Base) MCG/ACT inhaler Commonly known as:  PROVENTIL HFA;VENTOLIN HFA Inhale 2 puffs into the lungs 2 (two) times daily. Take every daily per niece   allopurinol 100 MG tablet Commonly known as:  ZYLOPRIM Take 100 mg by  mouth daily. 0900   cholecalciferol 1000 units tablet Commonly known as:  VITAMIN D Take 1,000 Units by mouth daily. Reported on 11/06/2015   clopidogrel 75 MG tablet Commonly known as:  PLAVIX Take 1 tablet (75 mg total) by mouth daily with breakfast.   docusate sodium 100 MG capsule Commonly known as:  COLACE Take 100 mg by mouth 2 (two) times daily.   doxycycline 100 MG tablet Commonly known as:  VIBRA-TABS Take 1 tablet (100 mg total) by mouth 2 (two) times daily.   famotidine 20 MG tablet Commonly known as:  PEPCID Take 20 mg by mouth daily. For GERD.   gabapentin 100 MG capsule Commonly known as:  NEURONTIN Take 200 mg by mouth at bedtime.   levothyroxine 25 MCG tablet Commonly known as:  SYNTHROID, LEVOTHROID Take 12.5 mcg by mouth daily at 6 (six) AM. 1/2 tablet (12.5 mcg)   loperamide 2 MG tablet Commonly known as:  IMODIUM A-D Give  2 tablets  4mg ) after initial dose following  each loose stool following. Do not give if < 3 stools or Dx c.diff or norovirus. So do not exceed > 16 mg/24 hr period.   meloxicam 7.5 MG tablet Commonly known as:  MOBIC Take 7.5 mg by mouth daily.   methocarbamol 500 MG tablet Commonly known as:  ROBAXIN Take 500 mg by mouth every 6 (six) hours as needed for muscle spasms.   multivitamin with minerals tablet Take 1 tablet by mouth daily.   nitroGLYCERIN 0.2  mg/hr patch Commonly known as:  NITRODUR - Dosed in mg/24 hr Place 1 patch (0.2 mg total) onto the skin daily. Rotate site along dorsum of foot daily.   NUTRITIONAL SUPPLEMENT PO Initiate Med Pass 2.0 , 90 mls by mouth 2 times daily   oxyCODONE 5 MG immediate release tablet Commonly known as:  Oxy IR/ROXICODONE Take 1 tablet (5 mg total) by mouth every 4 (four) hours as needed for moderate pain or severe pain.   polyethylene glycol packet Commonly known as:  MIRALAX / GLYCOLAX Take 17 g by mouth daily. Hold for loose stool   senna 8.6 MG Tabs tablet Commonly known as:  SENOKOT Take 1 tablet (8.6 mg total) by mouth 2 (two) times daily.   silver sulfADIAZINE 1 % cream Commonly known as:  SILVADENE Apply 1 application topically daily.   traZODone 50 MG tablet Commonly known as:  DESYREL Take 50 mg by mouth at bedtime.      Oxycodone #30 No Refill  Disposition: Home  Patient's condition: is Good  Follow up: 1. Dr. Randie Heinz in 4 weeks 2. Barnie Del, NP [facility]) in 2 weeks   Maris Berger, [place] [facility] [phone] 09/16/2016  1:07 PM

## 2016-09-16 NOTE — Progress Notes (Signed)
  Progress Note    09/16/2016 8:57 AM 1 Day Post-Op  Subjective:  No issues overnight  Vitals:   09/16/16 0400 09/16/16 0825  BP:  117/60  Pulse:  75  Resp:  18  Temp: 97.4 F (36.3 C) 97.6 F (36.4 C)    Physical Exam: [email or web address] Right groin is soft Palpable left popliteal pulse Monophasic dp/peroneal signals at foot  CBC    Component Value Date/Time   WBC 9.5 09/16/2016 0206   RBC 4.30 09/16/2016 0206   HGB 10.0 (L) 09/16/2016 0206   HCT 32.4 (L) 09/16/2016 0206   PLT 325 09/16/2016 0206   MCV 75.3 (L) 09/16/2016 0206   MCH 23.3 (L) 09/16/2016 0206   MCHC 30.9 09/16/2016 0206   RDW 18.1 (H) 09/16/2016 0206   LYMPHSABS 1.7 07/22/2016 1626   MONOABS 4.5 (H) 07/22/2016 1626   EOSABS 0.0 07/22/2016 1626   BASOSABS 0.7 (H) 07/22/2016 1626    BMET    Component Value Date/Time   NA 139 09/16/2016 0206   NA 140 08/02/2016   K 4.7 09/16/2016 0206   CL 109 09/16/2016 0206   CO2 24 09/16/2016 0206   GLUCOSE 93 09/16/2016 0206   BUN 19 09/16/2016 0206   BUN 30 (A) 08/02/2016   CREATININE 1.22 09/16/2016 0206   CREATININE 0.92 05/22/2014 1437   CALCIUM 8.6 (L) 09/16/2016 0206   GFRNONAA 57 (L) 09/16/2016 0206   GFRAA >60 09/16/2016 0206    INR    Component Value Date/Time   INR 1.26 10/01/2015 1104     Intake/Output Summary (Last 24 hours) at 09/16/16 0857 Last data filed at 09/16/16 [id number]  Gross per 24 hour  Intake              240 ml  Output              700 ml  Net             -460 ml     Assessment:  76 y.o. male is s/p left lower extremity endo revascularization  Plan: Discharge home and f/u in our office in 4 weeks with left lower extremity duplex and abi He has scheduled f/u with Barnie DelErin Zamora, NP at [facility] for with foot wounds Rx for plavix  Cliffton Spradley C. Randie Heinzain, MD [facility] Office: [phone] Pager: [phone]  09/16/2016 8:57 AM

## 2016-09-16 NOTE — Care Management Obs Status (Signed)
MEDICARE OBSERVATION STATUS NOTIFICATION   Patient Details  Name: Caleb Hays MRN: [id number] Date of Birth: [birth year]   Medicare Observation Status Notification Given:  Yes    Leone Havenaylor, Jen Eppinger Clinton, RN 09/16/2016, 1:13 PM

## 2016-09-17 ENCOUNTER — Telehealth: Payer: Self-pay

## 2016-09-17 ENCOUNTER — Non-Acute Institutional Stay (SKILLED_NURSING_FACILITY): Payer: Medicare HMO | Admitting: Internal Medicine

## 2016-09-17 ENCOUNTER — Encounter: Payer: Self-pay | Admitting: Internal Medicine

## 2016-09-17 DIAGNOSIS — K5903 Drug induced constipation: Secondary | ICD-10-CM

## 2016-09-17 DIAGNOSIS — R2681 Unsteadiness on feet: Secondary | ICD-10-CM

## 2016-09-17 DIAGNOSIS — D509 Iron deficiency anemia, unspecified: Secondary | ICD-10-CM | POA: Diagnosis not present

## 2016-09-17 DIAGNOSIS — I739 Peripheral vascular disease, unspecified: Secondary | ICD-10-CM | POA: Diagnosis not present

## 2016-09-17 DIAGNOSIS — T402X5A Adverse effect of other opioids, initial encounter: Secondary | ICD-10-CM

## 2016-09-17 DIAGNOSIS — S91105D Unspecified open wound of left lesser toe(s) without damage to nail, subsequent encounter: Secondary | ICD-10-CM | POA: Diagnosis not present

## 2016-09-17 DIAGNOSIS — I96 Gangrene, not elsewhere classified: Secondary | ICD-10-CM

## 2016-09-17 DIAGNOSIS — K219 Gastro-esophageal reflux disease without esophagitis: Secondary | ICD-10-CM | POA: Diagnosis not present

## 2016-09-17 DIAGNOSIS — K5901 Slow transit constipation: Secondary | ICD-10-CM

## 2016-09-17 NOTE — Telephone Encounter (Signed)
Possible re-admission to facility. This is a patient you were seeing at [facility] . [facility] is needed if patient was re-admitted to facility upon discharge. FYI readmit already completed. [facility].

## 2016-09-17 NOTE — Progress Notes (Signed)
LOCATION: [facility]  PCP: Oneal Grout, MD   Code Status: Full Code  Goals of care: Advanced Directive information Advanced Directives 09/15/2016  Does Patient Have a Medical Advance Directive? No  Type of Advance Directive -  Does patient want to make changes to medical advance directive? -  Copy of Healthcare Power of Attorney in Chart? -  Would patient like information on creating a medical advance directive? No - Patient declined       Extended Emergency Contact Information Primary Emergency Contact: [name],[name] Address: [address]          [place], [place] [address] [place] Home Phone: [phone] Mobile Phone: [phone] Relation: Niece Secondary Emergency Contact: [name] Home Phone: [phone] Mobile Phone: [phone] Relation: Friend   No Known Allergies  Chief Complaint  Patient presents with  . Readmit To SNF    Readmission Visit      HPI:  Patient is a 76 y.o. male seen today for long term care post hospital re-admission from 2/28 2018-09/16/2016 with gangrenous changes of toes on his left foot and pain. He underwent ultrasound-guided cannulation of right common femoral artery and this was found to be patent. Angiography of left lower extremity was then performed,  Stents were placed on left anterior tibia artery and left SFA- popliteal artery. He has medical history of severe peripheral arterial disease and is status post right below-knee amputation. He is a long-term resident at the facility. He is seen in his room today.  Review of Systems:  Constitutional: Negative for fever, chills, diaphoresis.  HENT: Negative for headache, congestion, nasal discharge, sore throat, difficulty swallowing.   Eyes: Negative for eye pain, blurred vision, double vision and discharge.  Respiratory: Negative for cough, shortness of breath and wheezing.   Cardiovascular: Negative for chest pain, palpitations. His  leg swelling on left leg has subsided.  Gastrointestinal: Negative for heartburn, nausea, vomiting, abdominal pain.he had a bowel movement yesterday. His appetite has been returning. Genitourinary: Negative for dysuria Musculoskeletal: Negative for back pain, fall.  denies leg pain. Skin: Negative for itching, rash.  Neurological: Negative for dizziness. Psychiatric/Behavioral: Negative for depression.   Past Medical History:  Diagnosis Date  . Adult failure to thrive    /notes 06/23/2015  . Anemia   . Arthritis    "hands" (06/23/2015)  . COPD (chronic obstructive pulmonary disease) (HCC)    LONG TIME SMOKER  . Critical lower limb ischemia   . 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 Depression    "periods of depression" (06/23/2015)  . Gout   . Physical deconditioning   . Pyelonephritis 06/23/2015  . Status post foot surgery    right fifth toe amputation by Dr. Lajoyce Corners    Past Surgical History:  Procedure Laterality Date  . ABDOMINAL AORTOGRAM W/LOWER EXTREMITY N/A 09/03/2016   Procedure: Abdominal Aortogram w/Lower Extremity;  Surgeon: Sherren Kerns, MD;  Location: [facility] INVASIVE CV LAB;  Service: Cardiovascular;  Laterality: N/A;  Lt. leg  . AMPUTATION Right 03/01/2014   Procedure: AMPUTATION RAY;  Surgeon: Nadara Mustard, MD;  Location: [facility] OR;  Service: Orthopedics;  Laterality: Right;  Right Foot 5th Ray Amputation  . AMPUTATION Right 04/26/2014   Procedure: Right Foot 4th Ray Amputation;  Surgeon: Nadara Mustard, MD;  Location: [facility] OR;  Service: Orthopedics;  Laterality: Right;  . AMPUTATION Right 10/01/2015   Procedure: Right Below Knee Amputation;  Surgeon:  Nadara MustardMarcus Duda V, MD;  [facility];  Service: Orthopedics;  Laterality: Right;  . AMPUTATION Left 07/23/2016   Procedure: Left 2nd Toe Amputation;  Surgeon: Nadara MustardMarcus V Duda, MD;  Location: [facility] OR;  Service: Orthopedics;  Laterality: Left;  . ANKLE ARTHROSCOPY Right 08/13/2015   Procedure: ANKLE  ARTHROSCOPY;  Surgeon: Nadara MustardMarcus Duda V, MD;  [facility];  Service: Orthopedics;  Laterality: Right;  . CATARACT EXTRACTION W/ INTRAOCULAR LENS  IMPLANT, BILATERAL Bilateral   . INGUINAL HERNIA REPAIR Left   . KNEE CARTILAGE SURGERY Left 1960's   football injury  . KNEE LIGAMENT RECONSTRUCTION Left 1960's  . LOWER EXTREMITY ANGIOGRAM N/A 05/27/2014   Procedure: LOWER EXTREMITY ANGIOGRAM;  Surgeon: Runell GessJonathan J Berry, MD;  [facility];  Service: Cardiovascular;  Laterality: N/A;  . LOWER EXTREMITY ANGIOGRAPHY Left 09/15/2016   Procedure: Lower Extremity Angiography;  Surgeon: Maeola HarmanBrandon Christopher Cain, MD;  [facility];  Service: Cardiovascular;  Laterality: Left;  . PERIPHERAL VASCULAR INTERVENTION Left 09/15/2016   Procedure: Peripheral Vascular Intervention;  Surgeon: Maeola HarmanBrandon Christopher Cain, MD;  [facility];  Service: Cardiovascular;  Laterality: Left;  SFA/popiteal  . STUMP REVISION Right 12/05/2015   Procedure: Revision Right Below Knee Amputation;  Surgeon: Nadara MustardMarcus Duda V, MD;  [facility];  Service: Orthopedics;  Laterality: Right;   Social History:   reports that he has quit smoking. His smoking use included Cigarettes. He has a 14.25 pack-year smoking history. He has never used smokeless tobacco. He reports that he drinks alcohol. He reports that he does not use drugs.  Family History  Problem Relation Age of Onset  . Heart failure Father   . Heart attack Father   . Heart failure Brother     Medications: Allergies as of 09/17/2016   No Known Allergies     Medication List       Accurate as of 09/17/16  3:30 PM. Always use your most recent med list.          albuterol 108 (90 Base) MCG/ACT inhaler Commonly known as:  PROVENTIL HFA;VENTOLIN HFA Inhale 2 puffs into the lungs 2 (two) times daily. Take every daily per niece   allopurinol 100 MG tablet Commonly known as:  ZYLOPRIM Take 100 mg by mouth daily. 0900     cholecalciferol 1000 units tablet Commonly known as:  VITAMIN D Take 1,000 Units by mouth daily. Reported on 11/06/2015   clopidogrel 75 MG tablet Commonly known as:  PLAVIX Take 1 tablet (75 mg total) by mouth daily with breakfast.   docusate sodium 100 MG capsule Commonly known as:  COLACE Take 100 mg by mouth 2 (two) times daily.   doxycycline 100 MG EC tablet Commonly known as:  DORYX Take 100 mg by mouth daily.   famotidine 20 MG tablet Commonly known as:  PEPCID Take 20 mg by mouth daily. For GERD.   gabapentin 100 MG capsule Commonly known as:  NEURONTIN Take 200 mg by mouth at bedtime.   levothyroxine 25 MCG tablet Commonly known as:  SYNTHROID, LEVOTHROID Take 12.5 mcg by mouth daily at 6 (six) AM. 1/2 tablet (12.5 mcg)   loperamide 2 MG tablet Commonly known as:  IMODIUM A-D Give  2 tablets  4mg ) after initial dose following  each loose stool following. Do not give if < 3 stools or Dx c.diff or norovirus. So do not exceed > 16 mg/24 hr period.   meloxicam 7.5 MG tablet Commonly known as:  MOBIC Take 7.5 mg by mouth daily.   methocarbamol 500 MG tablet Commonly known as:  ROBAXIN Take 500 mg by mouth every 6 (six) hours as needed for muscle spasms.   multivitamin with minerals tablet Take 1 tablet by mouth daily.   nitroGLYCERIN 0.2 mg/hr patch Commonly known as:  NITRODUR - Dosed in mg/24 hr Place 1 patch (0.2 mg total) onto the skin daily. Rotate site along dorsum of foot daily.   NUTRITIONAL SUPPLEMENT PO Initiate Med Pass 2.0 , 90 mls by mouth 2 times daily   omeprazole 20 MG capsule Commonly known as:  PRILOSEC Take 20 mg by mouth daily.   oxyCODONE 5 MG immediate release tablet Commonly known as:  Oxy IR/ROXICODONE Take 1 tablet (5 mg total) by mouth every 4 (four) hours as needed for moderate pain or severe pain.   polyethylene glycol packet Commonly known as:  MIRALAX / GLYCOLAX Take 17 g by mouth daily. Hold for loose stool   senna 8.6  MG Tabs tablet Commonly known as:  SENOKOT Take 1 tablet (8.6 mg total) by mouth 2 (two) times daily.   silver sulfADIAZINE 1 % cream Commonly known as:  SILVADENE Apply 1 application topically daily.   traZODone 50 MG tablet Commonly known as:  DESYREL Take 50 mg by mouth at bedtime.       Immunizations: Immunization History  Administered Date(s) Administered  . Influenza,inj,Quad PF,36+ Mos 06/25/2015  . PPD Test 10/03/2015, 10/17/2015  . Pneumococcal Polysaccharide-23 06/25/2015     Physical Exam: Vitals:   09/17/16 1523  BP: 137/80  Pulse: 69  Resp: 20  Temp: 97 F (36.1 C)  TempSrc: Oral  SpO2: 96%  Weight: 155 lb (70.3 kg)  Height: 6\' 4"  (1.93 m)   Body mass index is 18.87 kg/m.  General- elderly Male, thin and frail, in no acute distress Head- normocephalic, atraumatic Nose- no maxillary or frontal sinus tenderness, no nasal discharge Throat- moist mucus membrane, edentulous Eyes- PERRLA, EOMI, no pallor, no icterus, no discharge, normal conjunctiva, normal sclera Neck- no cervical lymphadenopathy Cardiovascular- normal s1,s2, no murmur Respiratory- bilateral clear to auscultation, no wheeze, no rhonchi, no crackles, no use of accessory muscles Abdomen- bowel sounds present, soft, non tender, no guarding or rigidity, no CVA tenderness Musculoskeletal- able to move all 4 extremities, right below-knee amputation, amputated second toe of left foot, trace left leg edema Neurological- alert and oriented to person, place and time Skin- warm and dry, bruise to right groin, dry gangrene changes to left great toe and left third toe noted, dehisced wound at second toe amputation site with minimal drainage Psychiatry- normal mood and affect    Labs reviewed: Basic Metabolic Panel:  Recent Labs  91/47/82 0746 07/27/16 0854  09/03/16 0809 09/09/16 09/15/16 1017 09/15/16 1752 09/16/16 0206  NA 136 135  < > 140 142 140  --  139  K 4.5 4.7  < > 4.2 4.5 4.3   --  4.7  CL 104 104  --  105  --  106  --  109  CO2 24 24  --   --   --   --   --  24  GLUCOSE 87 105*  --  83  --  83  --  93  BUN 25* 23*  < > 22* 21 21*  --  19  CREATININE 1.69* 1.57*  < > 1.20 1.1 1.10 1.08 1.22  CALCIUM 9.0 9.4  --   --   --   --   --  8.6*  < > = values in this interval not displayed. Liver Function Tests:  Recent Labs  10/01/15 1104 10/07/15 04/12/16  AST 17 10* 9*  ALT 15* 5* 5*  ALKPHOS 113 85 70  BILITOT 0.7  --   --   PROT 5.8*  --   --   ALBUMIN 2.5*  --   --    No results for input(s): LIPASE, AMYLASE in the last 8760 hours. No results for input(s): AMMONIA in the last 8760 hours. CBC:  Recent Labs  04/12/16 07/22/16 1626  07/27/16 0854  09/09/16 09/15/16 1017 09/15/16 1752 09/16/16 0206  WBC 8.0 17.2*  < > 13.0*  < > 8.2  --  11.7* 9.5  NEUTROABS 3 10.3*  --   --   --   --   --   --   --   HGB 10.3* 10.6*  < > 10.4*  < > 10.0* 11.2* 10.7* 10.0*  HCT 34* 33.8*  < > 33.0*  < > 33* 33.0* 35.0* 32.4*  MCV  --  72.7*  < > 72.4*  --   --   --  76.6* 75.3*  PLT 321 293  < > 274  < > 302  --  331 325  < > = values in this interval not displayed. Cardiac Enzymes: No results for input(s): CKTOTAL, CKMB, CKMBINDEX, TROPONINI in the last 8760 hours. BNP: Invalid input(s): POCBNP CBG:  Recent Labs  12/05/15 1414  GLUCAP 84    Radiological Exams: No results found.   Assessment/Plan  Unsteady gait Status post right below-knee amputation and has gangrene to his left foot. Will need to work with physical therapy and occupational therapy as tolerated to help with gait training and strengthening exercise. Fall precautions to be taken.   PAD With left toe gangrene. Status post angiogram and stent placement. Continue nitroglycerin patch daily. Continue Plavix. Will need follow-up with vascular team. Continue oxycodone 5 mg every 4 hour as needed for pain and Robaxin 500 mg every 6 hours as needed for muscle spasm. Monitor his vital signs and for  signs of infection  Left toe gangrene Remains afebrile. Denies any pain at present. Continue and complete course of doxycycline 100 mg daily for a month. Cleaned arterial ulcer on left third toe and left great toe with Skin-Prep and monitor for signs of infection.   wound to left second toe There is an open wound at the amputation site of the left second toe. No signs of infection at present. Drainage is present. Will need to apply silver alginate dressing after cleaning the wound to help keep the wound surface moist but to avoid excessive exudates and to prevent infection. Treatment nurse on board.  Microcytic anemia With low MCV. 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 Monitor CBC periodically with patient on Plavix .  COPD Breathing has been stable. Continue his bronchodilators. No changes made.  Opioid induced Constipation Has been having regular bowel movement. Continue senna twice a day and daily MiraLAX.  GERD Symptoms under control. Continue famotidine and omeprazole.    Goals of care: long term care   Labs/tests ordered:CBC, BMP 09/20/2016   Family/ staff Communication: reviewed care plan with patient and nursing supervisor    Oneal Grout, MD Internal Medicine [facility] [address] [place], [place] [address] Cell Phone (Monday-Friday 8 am - 5 pm): [phone] On Call: [phone] and follow prompts after 5 pm and on weekends Office Phone: [phone]  Office Fax: [phone]

## 2016-09-22 ENCOUNTER — Telehealth: Payer: Self-pay | Admitting: Vascular Surgery

## 2016-09-22 NOTE — Telephone Encounter (Signed)
-----   Message from Sharee PimpleMarilyn K McChesney, RN sent at 09/16/2016  5:01 PM EST ----- Regarding: schedule   ----- Message ----- From: Raymond GurneyKimberly A Trinh, PA-C Sent: 09/16/2016   1:30 PM To: Vvs Charge Pool  S/p left lower extremity endo revascularization 09/15/16  F/u in 4 weeks with LLE duplex and ABIs and appointment with Dr. Randie Heinzain.   Thanks Selena BattenKim

## 2016-09-22 NOTE — Telephone Encounter (Signed)
Sched lab 10/11/16 at 9:00 and MD 10/13/16 at 12:45. Lm on hm# to inform pt of appts.

## 2016-09-27 LAB — CBC AND DIFFERENTIAL
HEMATOCRIT: 31 % — AB (ref 41–53)
HEMOGLOBIN: 9.6 g/dL — AB (ref 13.5–17.5)
Platelets: 543 10*3/uL — AB (ref 150–399)
WBC: 16.2 10^3/mL

## 2016-09-27 LAB — BASIC METABOLIC PANEL
BUN: 24 mg/dL — AB (ref 4–21)
Creatinine: 1.4 mg/dL — AB (ref 0.6–1.3)
Glucose: 82 mg/dL
POTASSIUM: 4.9 mmol/L (ref 3.4–5.3)
SODIUM: 141 mmol/L (ref 137–147)

## 2016-09-28 ENCOUNTER — Telehealth (INDEPENDENT_AMBULATORY_CARE_PROVIDER_SITE_OTHER): Payer: Self-pay | Admitting: *Deleted

## 2016-09-28 ENCOUNTER — Non-Acute Institutional Stay (SKILLED_NURSING_FACILITY): Payer: Medicare HMO | Admitting: Family

## 2016-09-28 DIAGNOSIS — D72829 Elevated white blood cell count, unspecified: Secondary | ICD-10-CM | POA: Diagnosis not present

## 2016-09-28 DIAGNOSIS — I96 Gangrene, not elsewhere classified: Secondary | ICD-10-CM

## 2016-09-28 MED ORDER — DOXYCYCLINE HYCLATE 100 MG PO TBEC: 100.0000 mg | DELAYED_RELEASE_TABLET | Freq: Two times a day (BID) | ORAL | 0 refills | Status: DC

## 2016-09-28 NOTE — Telephone Encounter (Signed)
Denise from facility calling stating patients WBC is 16.2 She states he is taking 100mg  once a day and this isn't keeping his WBC down, and his infection under control. She is asking for a call back on what needs to be done next? [phone]   Ext [phone]

## 2016-09-28 NOTE — Progress Notes (Signed)
Location:  [facility] Room Number: 505 A  Place of Service:  SNF (31) Provider:  Turhan Chill FNP-C   Oneal Grout, MD  Patient Care Team: Oneal Grout, MD as PCP - General (Internal Medicine) Runell Gess, MD as Consulting Physician (Cardiology) Nadara Mustard, MD as Consulting Physician (Orthopedic Surgery) Abelino Derrick, PA-C as Physician Assistant (Cardiology)  Extended Emergency Contact Information Primary Emergency Contact: [name],[name] Address: [address]          [place], [place] [address] [place] of [place] Home Phone: [phone] Mobile Phone: [phone] Relation: Niece Secondary Emergency Contact: [name] of [place] Home Phone: [phone] Mobile Phone: [phone] Relation: Friend  Code Status: Full Code  Goals of care: Advanced Directive information Advanced Directives 09/15/2016  Does Patient Have a Medical Advance Directive? No  Type of Advance Directive -  Does patient want to make changes to medical advance directive? -  Copy of Healthcare Power of Attorney in Chart? -  Would patient like information on creating a medical advance directive? No - Patient declined     Chief Complaint  Patient presents with  . Acute Visit    abnormal lab results     HPI:  Pt is a 76 y.o. male seen today at [facility] for acute visit for evaluation of abnormal lab results.He has a medical history of HTN, PVD, COPD, Hypothyroidism, Right BKA among other conditions. He is seen in his room today. He denies any new acute issues this visit. His recent lab results showed WBC 16.2 Hgb 9.6 ( 09/27/2016). currently on prophylaxis Doxycycline 100 mg tablet daily for left toe gangrene.He states would like to preserve leg if possible but does not mind toes being amputated.Of note he is status post revascularization of left leg. He has ischemia to left great toe, third and fourth toes. He had left second  toe amputation 08/06/2017 with non healing wound managed by wound care nurse.He has a upcoming appointment with [facility].He denies any fever or chills.Facility Nurse reports no new concerns. Facility Nurse supervisor notified to contact [facility] for a sooner appointment for evaluation of worsening left leg ischemia. Discussed with patient option for a sooner appointment with [facility] or send to ER for evaluation. Nurse supervisor awaiting call from [facility].    Past Medical History:  Diagnosis Date  . Adult failure to thrive    /notes 06/23/2015  . Anemia   . Arthritis    "hands" (06/23/2015)  . COPD (chronic obstructive pulmonary disease) (HCC)    LONG TIME SMOKER  . Critical lower limb ischemia   . 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 Depression    "periods of depression" (06/23/2015)  . Gout   . Physical deconditioning   . Pyelonephritis 06/23/2015  . Status post foot surgery    right fifth toe amputation by Dr. Lajoyce Corners    Past Surgical History:  Procedure Laterality Date  . ABDOMINAL AORTOGRAM W/LOWER EXTREMITY N/A 09/03/2016   Procedure: Abdominal Aortogram w/Lower Extremity;  Surgeon: Sherren Kerns, MD;  Location: [facility] INVASIVE CV LAB;  Service: Cardiovascular;  Laterality: N/A;  Lt. leg  . AMPUTATION Right 03/01/2014   Procedure: AMPUTATION RAY;  Surgeon: Nadara Mustard, MD;  Location: [facility] OR;  Service: Orthopedics;  Laterality: Right;  Right Foot 5th Ray Amputation  . AMPUTATION Right 04/26/2014   Procedure: Right Foot 4th Ray Amputation;  Surgeon: Nadara MustardMarcus Duda V, MD;  [facility];  Service: Orthopedics;  Laterality: Right;  . AMPUTATION Right 10/01/2015   Procedure: Right Below Knee Amputation;  Surgeon: Nadara MustardMarcus Duda V, MD;  [facility];  Service: Orthopedics;  Laterality: Right;  . AMPUTATION Left 07/23/2016   Procedure: Left 2nd Toe Amputation;  Surgeon: Nadara MustardMarcus V Duda, MD;  Location: [facility] OR;  Service: Orthopedics;   Laterality: Left;  . ANKLE ARTHROSCOPY Right 08/13/2015   Procedure: ANKLE ARTHROSCOPY;  Surgeon: Nadara MustardMarcus Duda V, MD;  [facility];  Service: Orthopedics;  Laterality: Right;  . CATARACT EXTRACTION W/ INTRAOCULAR LENS  IMPLANT, BILATERAL Bilateral   . INGUINAL HERNIA REPAIR Left   . KNEE CARTILAGE SURGERY Left 1960's   football injury  . KNEE LIGAMENT RECONSTRUCTION Left 1960's  . LOWER EXTREMITY ANGIOGRAM N/A 05/27/2014   Procedure: LOWER EXTREMITY ANGIOGRAM;  Surgeon: Runell GessJonathan J Berry, MD;  [facility];  Service: Cardiovascular;  Laterality: N/A;  . LOWER EXTREMITY ANGIOGRAPHY Left 09/15/2016   Procedure: Lower Extremity Angiography;  Surgeon: Maeola HarmanBrandon Christopher Cain, MD;  [facility];  Service: Cardiovascular;  Laterality: Left;  . PERIPHERAL VASCULAR INTERVENTION Left 09/15/2016   Procedure: Peripheral Vascular Intervention;  Surgeon: Maeola HarmanBrandon Christopher Cain, MD;  [facility];  Service: Cardiovascular;  Laterality: Left;  SFA/popiteal  . STUMP REVISION Right 12/05/2015   Procedure: Revision Right Below Knee Amputation;  Surgeon: Nadara MustardMarcus Duda V, MD;  [facility];  Service: Orthopedics;  Laterality: Right;    No Known Allergies  Allergies as of 09/28/2016   No Known Allergies     Medication List       Accurate as of 09/28/16  7:31 PM. Always use your most recent med list.          albuterol 108 (90 Base) MCG/ACT inhaler Commonly known as:  PROVENTIL HFA;VENTOLIN HFA Inhale 2 puffs into the lungs 2 (two) times daily. Take every daily per niece   allopurinol 100 MG tablet Commonly known as:  ZYLOPRIM Take 100 mg by mouth daily. 0900   cholecalciferol 1000 units tablet Commonly known as:  VITAMIN D Take 1,000 Units by mouth daily. Reported on 11/06/2015   clopidogrel 75 MG tablet Commonly known as:  PLAVIX Take 1 tablet (75 mg total) by mouth daily with breakfast.   docusate sodium 100 MG capsule Commonly known as:   COLACE Take 100 mg by mouth 2 (two) times daily.   doxycycline 100 MG EC tablet Commonly known as:  DORYX Take 1 tablet (100 mg total) by mouth 2 (two) times daily. Then resume 100 mg tablet daily   famotidine 20 MG tablet Commonly known as:  PEPCID Take 20 mg by mouth daily. For GERD.   gabapentin 100 MG capsule Commonly known as:  NEURONTIN Take 200 mg by mouth at bedtime.   levothyroxine 25 MCG tablet Commonly known as:  SYNTHROID, LEVOTHROID Take 12.5 mcg by mouth daily at 6 (six) AM. 1/2 tablet (12.5 mcg)   loperamide 2 MG tablet Commonly known as:  IMODIUM A-D Give  2 tablets  4mg ) after initial dose following  each loose stool following. Do not give if < 3 stools or Dx c.diff or norovirus. So do not exceed > 16 mg/24 hr period.   meloxicam 7.5 MG tablet Commonly known as:  MOBIC Take 7.5 mg by mouth daily.   methocarbamol 500 MG tablet Commonly known as:  ROBAXIN Take 500 mg by mouth every 6 (six) hours  as needed for muscle spasms.   multivitamin with minerals tablet Take 1 tablet by mouth daily.   nitroGLYCERIN 0.2 mg/hr patch Commonly known as:  NITRODUR - Dosed in mg/24 hr Place 1 patch (0.2 mg total) onto the skin daily. Rotate site along dorsum of foot daily.   NUTRITIONAL SUPPLEMENT PO Initiate Med Pass 2.0 , 90 mls by mouth 2 times daily   omeprazole 20 MG capsule Commonly known as:  PRILOSEC Take 20 mg by mouth daily.   oxyCODONE 5 MG immediate release tablet Commonly known as:  Oxy IR/ROXICODONE Take 1 tablet (5 mg total) by mouth every 4 (four) hours as needed for moderate pain or severe pain.   polyethylene glycol packet Commonly known as:  MIRALAX / GLYCOLAX Take 17 g by mouth daily. Hold for loose stool   senna 8.6 MG Tabs tablet Commonly known as:  SENOKOT Take 1 tablet (8.6 mg total) by mouth 2 (two) times daily.   silver sulfADIAZINE 1 % cream Commonly known as:  SILVADENE Apply 1 application topically daily.   traZODone 50 MG  tablet Commonly known as:  DESYREL Take 50 mg by mouth at bedtime.       Review of Systems  Constitutional: Negative for activity change, appetite change, chills, fatigue and fever.  HENT: Negative for congestion, postnasal drip, rhinorrhea, sinus pressure, sneezing and sore throat.   Eyes: Negative.   Respiratory: Negative for cough, chest tightness, shortness of breath and wheezing.   Cardiovascular: Negative for chest pain, palpitations and leg swelling.  Gastrointestinal: Negative for abdominal distention, abdominal pain, constipation, diarrhea, nausea and vomiting.  Endocrine: Negative for cold intolerance, heat intolerance, polydipsia, polyphagia and polyuria.  Genitourinary: Negative for dysuria, frequency and urgency.  Musculoskeletal: Positive for gait problem.  Skin: Positive for color change. Negative for pallor and rash.       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 Gangrene on left great toe, 3rd, 4th and 5 th toe with new area on lateral foot.   Allergic/Immunologic: Negative.   Neurological: Negative for dizziness, seizures, syncope, light-headedness and headaches.  Hematological: Does not bruise/bleed easily.  Psychiatric/Behavioral: Negative for agitation, confusion, hallucinations and sleep disturbance. The patient is not nervous/anxious.     Immunization History  Administered Date(s) Administered  . Influenza,inj,Quad PF,36+ Mos 06/25/2015  . PPD Test 10/03/2015, 10/17/2015  . Pneumococcal Polysaccharide-23 06/25/2015   Pertinent  Health Maintenance Due  Topic Date Due  . PNA vac Low Risk Adult (2 of 2 - PCV13) 06/24/2016  . INFLUENZA VACCINE  07/19/2017 (Originally 02/17/2016)  . COLONOSCOPY  07/19/2017 (Originally 03/04/1992)      Vitals:   09/28/16 1000  BP: 136/73  Pulse: 66  Resp: 20  Temp: 98.5 F (36.9 C)  SpO2: 97%  Weight: 170 lb (77.1 kg)  Height: 6\' 4"  (1.93 m)   Body mass index is 20.69 kg/m. Physical Exam   Constitutional: He is oriented to person, place, and time.  Thin frail elderly in no acute distress.   HENT:  Head: Normocephalic.  Mouth/Throat: Oropharynx is clear and moist.  Eyes: Conjunctivae and EOM are normal. Pupils are equal, round, and reactive to light. Right eye exhibits no discharge. Left eye exhibits no discharge. No scleral icterus.  Neck: Normal range of motion. No JVD present. No thyromegaly present.  Cardiovascular: Normal rate, regular rhythm, normal heart sounds and intact distal pulses.  Exam reveals no gallop and no friction rub.   No murmur heard. Pulmonary/Chest: Effort normal and  breath sounds normal. No respiratory distress. He has no wheezes. He has no rales.  Abdominal: Soft. Bowel sounds are normal. He exhibits no distension and no mass. There is no tenderness. There is no rebound and no guarding.  Genitourinary:  Genitourinary Comments: Continent   Musculoskeletal: He exhibits no edema or tenderness.  Right BKA. 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   Lymphadenopathy:    He has no cervical adenopathy.  Neurological: He is oriented to person, place, and time.  Skin: Skin is warm and dry. No rash noted. No erythema. No pallor.  Left 2nd toe amputee wound bed  Dry black in color without any drainage.   Left great, third and fourth toes black in color non tender to touch.  Left lateral foot ulcer dry eschar noted.    Psychiatric: He has a normal mood and affect.    Labs reviewed:  Recent Labs  07/26/16 0746 07/27/16 0854  09/03/16 0809 09/09/16 09/15/16 1017 09/15/16 1752 09/16/16 0206  NA 136 135  < > 140 142 140  --  139  K 4.5 4.7  < > 4.2 4.5 4.3  --  4.7  CL 104 104  --  105  --  106  --  109  CO2 24 24  --   --   --   --   --  24  GLUCOSE 87 105*  --  83  --  83  --  93  BUN 25* 23*  < > 22* 21 21*  --  19  CREATININE 1.69* 1.57*  < > 1.20 1.1 1.10 1.08 1.22  CALCIUM 9.0 9.4  --   --   --   --   --  8.6*  < > = values in this interval  not displayed.  Recent Labs  10/01/15 1104 10/07/15 04/12/16  AST 17 10* 9*  ALT 15* 5* 5*  ALKPHOS 113 85 70  BILITOT 0.7  --   --   PROT 5.8*  --   --   ALBUMIN 2.5*  --   --     Recent Labs  04/12/16 07/22/16 1626  07/27/16 0854  09/09/16 09/15/16 1017 09/15/16 1752 09/16/16 0206  WBC 8.0 17.2*  < > 13.0*  < > 8.2  --  11.7* 9.5  NEUTROABS 3 10.3*  --   --   --   --   --   --   --   HGB 10.3* 10.6*  < > 10.4*  < > 10.0* 11.2* 10.7* 10.0*  HCT 34* 33.8*  < > 33.0*  < > 33* 33.0* 35.0* 32.4*  MCV  --  72.7*  < > 72.4*  --   --   --  76.6* 75.3*  PLT 321 293  < > 274  < > 302  --  331 325  < > = values in this interval not displayed. Lab Results  Component Value Date   TSH 3.56 03/15/2016   Lab Results  Component Value Date   HGBA1C 5.4 07/23/2016   Lab Results  Component Value Date   CHOL 129 02/09/2016   HDL 22 (A) 02/09/2016   LDLCALC 78 02/09/2016   TRIG 144 02/09/2016   Assessment/Plan 1. Leukocytosis   Afebrile. WBC 16.2 (09/27/2016).currently on doxycycline 100 mg Tablet once daily for prophylaxis left toe ischemia. Will start doxycycline 100 mg Tablet twice daily x 10 days then resume prophylaxis daily. [facility] contacted by facility Nurse supervisor for a sooner appointment  for worsening toe ischemia.Facility Nurse supervisor awaiting call back from [employer].continue to monitor Temp curve. Will send to ER if febrile.   2. Ischemia of left toes Left 2nd toe amputee wound nonhealing and worsening ischemia to  Left great, third and fourth toes.Left lateral foot new eschar area without any signs of infections. Obtain sooner appointment with Patient's Ortho. [facility] contacted by facility Nurse supervisor for a sooner appointment.Facility Nurse supervisor awaiting call back from [employer].Continue wound care.   Family/ staff Communication: Reviewed plan of care with patient and facility   Labs/tests ordered: None

## 2016-09-28 NOTE — Telephone Encounter (Signed)
Call facility. Patient needs to be seen in our office so we can evaluate treatment plan.

## 2016-09-29 NOTE — Telephone Encounter (Signed)
Patient has follow up appointment this Friday with Erin.

## 2016-09-29 NOTE — Telephone Encounter (Signed)
Advised of message below, the facility nurse practioner is concerned patient may need additional surgery.

## 2016-09-30 ENCOUNTER — Inpatient Hospital Stay (HOSPITAL_COMMUNITY): Admission: RE | Admit: 2016-09-30 | Payer: Medicare HMO | Source: Ambulatory Visit

## 2016-09-30 ENCOUNTER — Encounter: Payer: Medicare HMO | Admitting: Vascular Surgery

## 2016-09-30 ENCOUNTER — Encounter: Payer: Self-pay | Admitting: Vascular Surgery

## 2016-10-01 ENCOUNTER — Encounter (INDEPENDENT_AMBULATORY_CARE_PROVIDER_SITE_OTHER): Payer: Self-pay | Admitting: Family

## 2016-10-01 ENCOUNTER — Ambulatory Visit (INDEPENDENT_AMBULATORY_CARE_PROVIDER_SITE_OTHER): Payer: Medicare HMO | Admitting: Family

## 2016-10-01 VITALS — Ht 76.0 in | Wt 170.0 lb

## 2016-10-01 DIAGNOSIS — I96 Gangrene, not elsewhere classified: Secondary | ICD-10-CM

## 2016-10-01 DIAGNOSIS — L089 Local infection of the skin and subcutaneous tissue, unspecified: Secondary | ICD-10-CM

## 2016-10-01 MED ORDER — OXYCODONE HCL 5 MG PO CAPS: 5.0000 mg | ORAL_CAPSULE | Freq: Every day | ORAL | 0 refills | Status: DC

## 2016-10-01 NOTE — Progress Notes (Signed)
Office Visit Note   Patient: Caleb Hays           Date of Birth: 1940/10/23           MRN: [id number] Visit Date: 10/01/2016              Requested by: Oneal Grout, MD [address] [place], [place] [address] PCP: Oneal Grout, MD  Chief Complaint  Patient presents with  . Left Foot - Routine Post Op    07/23/16 Left 2nd Toe Amputation       HPI: Patient is here for follow up of his left foot second toe amputation. The great toe and 3rd toe are gangrenous. The 4th and 5th toe nails are discolored. The incision has a black scab. There are two areas that are scabbed towards the ankle. The pt states that he thinks " the toes need to come off" and that he is "not concerned about loosing toes he just ants to save the foot." he also questions if he can increase his pain medication from Oxy IR 5 m to 10 mg. He states that the pain at night is "pretty bad" Autumn L Forrest, RMA   Patient is a 76 year old gentleman who presents today in follow-up for left foot second toe amputation. This has been nonhealing. He has dry gangrene to the great toe and third toe. We have been awaiting vascular surgery's input. He is status post revascularization. The patient is eager to proceed with treatment for the gangrenous toes. He also complains of increased pain especially at night. His been taking oxycodone IR 5 mg every 4 hours as needed.    Assessment & Plan: Visit Diagnoses: No diagnosis found.  Plan: We will proceed with limb salvage surgery. Patient is in agreement with the plan. We will set him up for amputation of the left foot great toe and third toe versus transmetatarsal amputation. Continue with antibiotics and dressing changes daily. Have given him the prescription for oxycodone daily at bedtime.  Follow-Up Instructions: No Follow-up on file.   Ortho Exam Left foot: There are gangrenous changes to the tip of the great toe as well as the third toe. This is dry. There is no drainage.  The surgical site from the second toe amputation has not healed this is covered with eschar. There is no drainage no ascending cellulitis. Does have pitting edema to the left lower extremity. Imaging: No results found.  Labs: Lab Results  Component Value Date   HGBA1C 5.4 07/23/2016   HGBA1C 5.3 02/09/2016   HGBA1C 4.4 12/30/2015   ESRSEDRATE 25 (H) 07/22/2016   ESRSEDRATE 11 04/12/2016   ESRSEDRATE 61 (H) 08/13/2015   CRP 1.7 (H) 07/22/2016   CRP 9.6 (H) 08/13/2015   CRP 19.5 (H) 08/10/2015   LABURIC 6.6 07/23/2016   LABURIC 7.3 08/13/2015   LABURIC 9.4 (H) 08/09/2015   REPTSTATUS 07/25/2016 FINAL 07/24/2016   GRAMSTAIN  08/13/2015    RARE WBC PRESENT, PREDOMINANTLY PMN NO ORGANISMS SEEN Performed at [facility]    GRAMSTAIN  08/13/2015    RARE WBC PRESENT, PREDOMINANTLY PMN NO ORGANISMS SEEN Performed at [facility]    CULT NO GROWTH 07/24/2016   LABORGA STAPHYLOCOCCUS AUREUS 08/13/2015    Orders:  No orders of the defined types were placed in this encounter.  No orders of the defined types were placed in this encounter.    Procedures: No procedures performed  Clinical Data: No additional findings.  Subjective: Review of  Systems  Constitutional: Negative for chills and fever.  Skin: Positive for color change and wound.    Objective: Vital Signs: Ht 6\' 4"  (1.93 m)   Wt 170 lb (77.1 kg)   BMI 20.69 kg/m   Specialty Comments:  No specialty comments available.  PMFS History: Patient Active Problem List   Diagnosis Date Noted  . PAD (peripheral artery disease) (HCC) 09/15/2016  . Hx of amputation of lesser toe, left (HCC) 08/06/2016  . Gangrene of toe of left foot (HCC)   . Toe infection 07/22/2016  . Osteomyelitis (HCC) 07/22/2016  . Skin ulcer of second toe of left foot (HCC) 07/01/2016  . Edema 05/05/2016  . Phantom pain 03/08/2016  . 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 Gout 03/08/2016  . Hypothyroidism  (acquired) 03/08/2016  . CKD (chronic kidney disease) stage 3, GFR 30-59 ml/min 03/08/2016  . GERD without esophagitis 03/08/2016  . Constipation 12/29/2015  . Below knee amputation status (HCC) 10/01/2015  . Ankle pain 08/13/2015  . Adult failure to thrive   . PVD (peripheral vascular disease) (HCC)   . Acute kidney injury superimposed on chronic kidney disease (HCC) 08/10/2015  . Elevated blood uric acid level 08/10/2015  . 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 Anemia of chronic disease 08/10/2015  . Malnutrition of moderate degree 06/24/2015  . UTI (urinary tract infection) 06/23/2015  . Failure to thrive in adult 06/23/2015  . Weight loss 06/23/2015  . Tobacco abuse 06/23/2015  . COPD (chronic obstructive pulmonary disease) (HCC) 06/23/2015  . Fall   . Critical lower limb ischemia 05/31/2014  . Status post right foot surgery 03/12/2014   Past Medical History:  Diagnosis Date  . Adult failure to thrive    /notes 06/23/2015  . Anemia   . Arthritis    "hands" (06/23/2015)  . COPD (chronic obstructive pulmonary disease) (HCC)    LONG TIME SMOKER  . Critical lower limb ischemia   . 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 Depression    "periods of depression" (06/23/2015)  . Gout   . Physical deconditioning   . Pyelonephritis 06/23/2015  . Status post foot surgery    right fifth toe amputation by Dr. Lajoyce Corners     Family History  Problem Relation Age of Onset  . Heart failure Father   . Heart attack Father   . Heart failure Brother     Past Surgical History:  Procedure Laterality Date  . ABDOMINAL AORTOGRAM W/LOWER EXTREMITY N/A 09/03/2016   Procedure: Abdominal Aortogram w/Lower Extremity;  Surgeon: Sherren Kerns, MD;  [facility] INVASIVE CV LAB;  Service: Cardiovascular;  Laterality: N/A;  Lt. leg  . AMPUTATION Right 03/01/2014   Procedure: AMPUTATION RAY;  Surgeon: Nadara Mustard, MD;  Location: [facility] OR;  Service: Orthopedics;  Laterality:  Right;  Right Foot 5th Ray Amputation  . AMPUTATION Right 04/26/2014   Procedure: Right Foot 4th Ray Amputation;  Surgeon: Nadara Mustard, MD;  Location[facility];  Service: Orthopedics;  Laterality: Right;  . AMPUTATION Right 10/01/2015   Procedure: Right Below Knee Amputation;  Surgeon: Nadara Mustard, MD;  Location: [facility] OR;  Service: Orthopedics;  Laterality: Right;  . AMPUTATION Left 07/23/2016   Procedure: Left 2nd Toe Amputation;  Surgeon: Nadara Mustard, MD;  Location: [facility] OR;  Service: Orthopedics;  Laterality: Left;  . ANKLE ARTHROSCOPY Right 08/13/2015   Procedure: ANKLE ARTHROSCOPY;  Surgeon: Nadara Mustard,  MD;  Location: [facility] OR;  Service: Orthopedics;  Laterality: Right;  . CATARACT EXTRACTION W/ INTRAOCULAR LENS  IMPLANT, BILATERAL Bilateral   . INGUINAL HERNIA REPAIR Left   . KNEE CARTILAGE SURGERY Left 1960's   football injury  . KNEE LIGAMENT RECONSTRUCTION Left 1960's  . LOWER EXTREMITY ANGIOGRAM N/A 05/27/2014   Procedure: LOWER EXTREMITY ANGIOGRAM;  Surgeon: Runell GessJonathan J Berry, MD;  [facility];  Service: Cardiovascular;  Laterality: N/A;  . LOWER EXTREMITY ANGIOGRAPHY Left 09/15/2016   Procedure: Lower Extremity Angiography;  Surgeon: Maeola HarmanBrandon Christopher Cain, MD;  [facility];  Service: Cardiovascular;  Laterality: Left;  . PERIPHERAL VASCULAR INTERVENTION Left 09/15/2016   Procedure: Peripheral Vascular Intervention;  Surgeon: Maeola HarmanBrandon Christopher Cain, MD;  [facility];  Service: Cardiovascular;  Laterality: Left;  SFA/popiteal  . STUMP REVISION Right 12/05/2015   Procedure: Revision Right Below Knee Amputation;  Surgeon: Nadara MustardMarcus Duda V, MD;  [facility];  Service: Orthopedics;  Laterality: Right;   Social History   Occupational History  . Not on file.   Social History Main Topics  . Smoking status: Former Smoker    Packs/day: 0.25    Years: 57.00    Types: Cigarettes  . Smokeless tobacco: Never Used  . Alcohol use Yes     Comment:  06/23/2015 "was drinking alot of beer; quit in 2015" 09/30/15 - no longer drinks  . Drug use: No  . Sexual activity: No

## 2016-10-06 ENCOUNTER — Encounter: Payer: Self-pay | Admitting: Family

## 2016-10-06 ENCOUNTER — Non-Acute Institutional Stay (SKILLED_NURSING_FACILITY): Payer: Medicare HMO | Admitting: Family

## 2016-10-06 ENCOUNTER — Other Ambulatory Visit (INDEPENDENT_AMBULATORY_CARE_PROVIDER_SITE_OTHER): Payer: Self-pay | Admitting: Family

## 2016-10-06 DIAGNOSIS — I96 Gangrene, not elsewhere classified: Secondary | ICD-10-CM

## 2016-10-06 DIAGNOSIS — E039 Hypothyroidism, unspecified: Secondary | ICD-10-CM

## 2016-10-06 DIAGNOSIS — J449 Chronic obstructive pulmonary disease, unspecified: Secondary | ICD-10-CM

## 2016-10-06 DIAGNOSIS — K5901 Slow transit constipation: Secondary | ICD-10-CM

## 2016-10-06 NOTE — Progress Notes (Signed)
Location:  [facility] Room Number: 505 A Place of Service:  SNF (31) Provider:  Braedyn Riggle FNP-C   Oneal Grout, MD  Patient Care Team: Oneal Grout, MD as PCP - General (Internal Medicine) Runell Gess, MD as Consulting Physician (Cardiology) Nadara Mustard, MD as Consulting Physician (Orthopedic Surgery) Abelino Derrick, PA-C as Physician Assistant (Cardiology)  Extended Emergency Contact Information Primary Emergency Contact: [name],[name] Address: [address]          [place], [place] [address] [place] of [place] Home Phone: [phone] Mobile Phone: [phone] Relation: Niece Secondary Emergency Contact: [name] of [place] Home Phone: [phone] Mobile Phone: [phone] Relation: Friend  Code Status: Full Code  Goals of care: Advanced Directive information Advanced Directives 09/15/2016  Does Patient Have a Medical Advance Directive? No  Type of Advance Directive -  Does patient want to make changes to medical advance directive? -  Copy of Healthcare Power of Attorney in Chart? -  Would patient like information on creating a medical advance directive? No - Patient declined     Chief Complaint  Patient presents with  . Medical Management of Chronic Issues    Routine Visit     HPI:  Pt is a 76 y.o. male seen today at [facility]  for medical management of chronic diseases. He has a medical history of HTN, PVD, COPD, Hypothyroidism, Right BKA among other conditions.He is seen in his room today lying in bed watching TV.He denies any acute issues this visit. He was seen recently 10/01/2016 by Dr.Duda at [facility] for left toes dry gangrene. He is scheduled for great toe and 3rd toe versus transmetatarsal amputation 10/08/2016.He denies any fever or chills. No recent fall episodes or weight changes noted. Facility Nurse reports no new concerns.    Past Medical History:  Diagnosis  Date  . Adult failure to thrive    /notes 06/23/2015  . Anemia   . Arthritis    "hands" (06/23/2015)  . COPD (chronic obstructive pulmonary disease) (HCC)    LONG TIME SMOKER  . Critical lower limb ischemia   . 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 Depression    "periods of depression" (06/23/2015)  . Gout   . Physical deconditioning   . Pyelonephritis 06/23/2015  . Status post foot surgery    right fifth toe amputation by Dr. Lajoyce Corners    Past Surgical History:  Procedure Laterality Date  . ABDOMINAL AORTOGRAM W/LOWER EXTREMITY N/A 09/03/2016   Procedure: Abdominal Aortogram w/Lower Extremity;  Surgeon: Sherren Kerns, MD;  Location: [facility] INVASIVE CV LAB;  Service: Cardiovascular;  Laterality: N/A;  Lt. leg  . AMPUTATION Right 03/01/2014   Procedure: AMPUTATION RAY;  Surgeon: Nadara Mustard, MD;  Location: [facility] OR;  Service: Orthopedics;  Laterality: Right;  Right Foot 5th Ray Amputation  . AMPUTATION Right 04/26/2014   Procedure: Right Foot 4th Ray Amputation;  Surgeon: Nadara Mustard, MD;  Location: [facility] OR;  Service: Orthopedics;  Laterality: Right;  . AMPUTATION Right 10/01/2015   Procedure: Right Below Knee Amputation;  Surgeon: Nadara Mustard, MD;  Location: [facility] OR;  Service: Orthopedics;  Laterality: Right;  . AMPUTATION Left 07/23/2016   Procedure: Left 2nd Toe Amputation;  Surgeon: Nadara Mustard, MD;  Location: [facility] OR;  Service: Orthopedics;  Laterality: Left;  . ANKLE ARTHROSCOPY Right 08/13/2015   Procedure: ANKLE ARTHROSCOPY;  Surgeon: Berna Spare  Kandis Mannan, MD;  Location: [facility] OR;  Service: Orthopedics;  Laterality: Right;  . CATARACT EXTRACTION W/ INTRAOCULAR LENS  IMPLANT, BILATERAL Bilateral   . INGUINAL HERNIA REPAIR Left   . KNEE CARTILAGE SURGERY Left 1960's   football injury  . KNEE LIGAMENT RECONSTRUCTION Left 1960's  . LOWER EXTREMITY ANGIOGRAM N/A 05/27/2014   Procedure: LOWER EXTREMITY ANGIOGRAM;  Surgeon: Runell Gess, MD;  Location: [facility] CATH LAB;   Service: Cardiovascular;  Laterality: N/A;  . LOWER EXTREMITY ANGIOGRAPHY Left 09/15/2016   Procedure: Lower Extremity Angiography;  Surgeon: Maeola Harman, MD;  Location: [facility] INVASIVE CV LAB;  Service: Cardiovascular;  Laterality: Left;  . PERIPHERAL VASCULAR INTERVENTION Left 09/15/2016   Procedure: Peripheral Vascular Intervention;  Surgeon: Maeola Harman, MD;  Location: [facility] INVASIVE CV LAB;  Service: Cardiovascular;  Laterality: Left;  SFA/popiteal  . STUMP REVISION Right 12/05/2015   Procedure: Revision Right Below Knee Amputation;  Surgeon: Nadara Mustard, MD;  Location: [facility] OR;  Service: Orthopedics;  Laterality: Right;    No Known Allergies  Allergies as of 10/06/2016   No Known Allergies     Medication List       Accurate as of 10/06/16  2:50 PM. Always use your most recent med list.          albuterol 108 (90 Base) MCG/ACT inhaler Commonly known as:  PROVENTIL HFA;VENTOLIN HFA Inhale 2 puffs into the lungs 2 (two) times daily. 0900 & 1700   allopurinol 100 MG tablet Commonly known as:  ZYLOPRIM Take 100 mg by mouth daily. 0900   cholecalciferol 1000 units tablet Commonly known as:  VITAMIN D Take 1,000 Units by mouth daily. (0900)   clopidogrel 75 MG tablet Commonly known as:  PLAVIX Take 1 tablet (75 mg total) by mouth daily with breakfast.   docusate sodium 100 MG capsule Commonly known as:  COLACE Take 100 mg by mouth 2 (two) times daily. 0900 & 1700   doxycycline 100 MG tablet Commonly known as:  VIBRA-TABS Take 100 mg by mouth 2 (two) times daily. Starting 09/28/16 then to resume previous order 1 by mouth daily for for left foot infection   famotidine 20 MG tablet Commonly known as:  PEPCID Take 20 mg by mouth daily. 0900   gabapentin 100 MG capsule Commonly known as:  NEURONTIN Take 200 mg by mouth at bedtime. (2100)   levothyroxine 25 MCG tablet Commonly known as:  SYNTHROID, LEVOTHROID Take 12.5 mcg by mouth daily at 6 (six) AM.    loperamide 2 MG tablet Commonly known as:  IMODIUM A-D Take 2-4 mg by mouth every 4 (four) hours as needed for diarrhea or loose stools (Do not give if <3 stools or DX. w/c. diff or norovirus. Do not exceed 16 mg/24hrs.).   meloxicam 7.5 MG tablet Commonly known as:  MOBIC Take 7.5 mg by mouth daily. 0900   methocarbamol 500 MG tablet Commonly known as:  ROBAXIN Take 500 mg by mouth every 6 (six) hours as needed for muscle spasms.   multivitamin with minerals tablet Take 1 tablet by mouth daily. (0900)   nitroGLYCERIN 0.2 mg/hr patch Commonly known as:  NITRODUR - Dosed in mg/24 hr Place 1 patch (0.2 mg total) onto the skin daily. Rotate site along dorsum of foot daily.   omeprazole 20 MG capsule Commonly known as:  PRILOSEC Take 20 mg by mouth daily at 6 (six) AM.   oxyCODONE 5 MG immediate release tablet Commonly known as:  Oxy IR/ROXICODONE  1 by mouth every 4 hours as needed for pain, 1-2 by mouth at bedtime   polyethylene glycol packet Commonly known as:  MIRALAX / GLYCOLAX Take 17 g by mouth daily. (0900) Hold for loose stool   saccharomyces boulardii 250 MG capsule Commonly known as:  FLORASTOR Take 250 mg by mouth 2 (two) times daily.   senna 8.6 MG Tabs tablet Commonly known as:  SENOKOT Take 1 tablet (8.6 mg total) by mouth 2 (two) times daily.   silver sulfADIAZINE 1 % cream Commonly known as:  SILVADENE Apply 1 application topically daily.   traZODone 50 MG tablet Commonly known as:  DESYREL Take 50 mg by mouth at bedtime. (2100)   UNABLE TO FIND Med Name: Medpass 90 ml twice daily for weight loss       Review of Systems  Constitutional: Negative for activity change, appetite change, chills, fatigue and fever.  HENT: Negative for congestion, postnasal drip, rhinorrhea, sinus pressure, sneezing and sore throat.   Eyes: Negative.   Respiratory: Negative for cough, chest tightness, shortness of breath and wheezing.   Cardiovascular: Negative for  chest pain, palpitations and leg swelling.  Gastrointestinal: Negative for abdominal distention, abdominal pain, constipation, diarrhea, nausea and vomiting.  Endocrine: Negative for cold intolerance, heat intolerance, polydipsia, polyphagia and polyuria.  Genitourinary: Negative for dysuria, frequency and urgency.  Musculoskeletal: Positive for gait problem.  Skin: Positive for color change. Negative for pallor and rash.       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    Neurological: Negative for dizziness, seizures, syncope, light-headedness and headaches.  Hematological: Does not bruise/bleed easily.  Psychiatric/Behavioral: Negative for agitation, confusion, hallucinations and sleep disturbance. The patient is not nervous/anxious.     Immunization History  Administered Date(s) Administered  . Influenza,inj,Quad PF,36+ Mos 06/25/2015  . PPD Test 10/03/2015, 10/17/2015  . Pneumococcal Conjugate-13 10/03/2015  . Pneumococcal Polysaccharide-23 06/25/2015   Pertinent  Health Maintenance Due  Topic Date Due  . INFLUENZA VACCINE  07/19/2017 (Originally 02/17/2016)  . COLONOSCOPY  07/19/2017 (Originally 03/04/1992)  . PNA vac Low Risk Adult  Completed      Vitals:   10/06/16 1053  BP: 132/78  Pulse: 70  Resp: 18  Temp: 97.5 F (36.4 C)  TempSrc: Oral  SpO2: 98%  Weight: 170 lb (77.1 kg)  Height: 6\' 4"  (1.93 m)   Body mass index is 20.69 kg/m. Physical Exam  Constitutional: He is oriented to person, place, and time.  Thin frail Elderly in no acute distress lying bed watching TV.   HENT:  Head: Normocephalic.  Mouth/Throat: Oropharynx is clear and moist.  Eyes: Conjunctivae and EOM are normal. Pupils are equal, round, and reactive to light. Right eye exhibits no discharge. Left eye exhibits no discharge. No scleral icterus.  Neck: Normal range of motion. No JVD present. No thyromegaly present.  Cardiovascular: Normal rate, regular rhythm, normal heart sounds and  intact distal pulses.  Exam reveals no gallop and no friction rub.   No murmur heard. Pulmonary/Chest: Effort normal and breath sounds normal. No respiratory distress. He has no wheezes. He has no rales.  Abdominal: Soft. Bowel sounds are normal. He exhibits no distension and no mass. There is no tenderness. There is no rebound and no guarding.  Genitourinary:  Genitourinary Comments: Continent   Musculoskeletal: He exhibits no edema or tenderness.  Right BKA. 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   Lymphadenopathy:    He has no cervical adenopathy.  Neurological:  He is oriented to person, place, and time.  Skin: Skin is warm and dry. No rash noted. No erythema. No pallor.  Left 2nd toe amputee wound bed without any signs of infections managed by wound Nurse.  Left great, third and fourth toes dry gangrene non tender to touch managed by wound care Nurse.   Psychiatric: He has a normal mood and affect.    Labs reviewed:  Recent Labs  07/26/16 0746 07/27/16 0854  09/03/16 0809  09/15/16 1017 09/15/16 1752 09/16/16 0206 09/27/16  NA 136 135  < > 140  < > 140  --  139 141  K 4.5 4.7  < > 4.2  < > 4.3  --  4.7 4.9  CL 104 104  --  105  --  106  --  109  --   CO2 24 24  --   --   --   --   --  24  --   GLUCOSE 87 105*  --  83  --  83  --  93  --   BUN 25* 23*  < > 22*  < > 21*  --  19 24*  CREATININE 1.69* 1.57*  < > 1.20  < > 1.10 1.08 1.22 1.4*  CALCIUM 9.0 9.4  --   --   --   --   --  8.6*  --   < > = values in this interval not displayed.  Recent Labs  04/12/16  AST 9*  ALT 5*  ALKPHOS 70    Recent Labs  04/12/16 07/22/16 1626  07/27/16 0854  09/15/16 1752 09/16/16 0206 09/27/16  WBC 8.0 17.2*  < > 13.0*  < > 11.7* 9.5 16.2  NEUTROABS 3 10.3*  --   --   --   --   --   --   HGB 10.3* 10.6*  < > 10.4*  < > 10.7* 10.0* 9.6*  HCT 34* 33.8*  < > 33.0*  < > 35.0* 32.4* 31*  MCV  --  72.7*  < > 72.4*  --  76.6* 75.3*  --   PLT 321 293  < > 274  < > 331 325 543*  < >  = values in this interval not displayed. Lab Results  Component Value Date   TSH 4.83 09/09/2016   Lab Results  Component Value Date   HGBA1C 5.4 07/23/2016   Lab Results  Component Value Date   CHOL 129 02/09/2016   HDL 22 (A) 02/09/2016   LDLCALC 78 02/09/2016   TRIG 144 02/09/2016   Assessment/Plan 1. COPD  Breathing stable. Continue on Duonebs as needed.   2. Hypothyroidism  Lab Results  Component Value Date   TSH 4.83 09/09/2016  Continue levothyroxine. Continue to monitor TSH level   3. Constipation  current regimen effective.  4. Gangrene of left toes Afebrile. Left 2nd toe amputee wound bed without any signs of infections managed by wound Nurse. Left great, third and fourth toes black in color non tender to touch managed by wound care Nurse. Recently seen 10/01/2016  by Dr. Lajoyce Corners at [facility] scheduled for great toe and 3rd toe versus transmetatarsal amputation 10/08/2016. Continue on doxycycline 100 mg Tablet twice daily. Continue to monitor temp curve. Continue wound care.      Family/ staff Communication: Reviewed plan of care with patient and facility   Labs/tests ordered: None

## 2016-10-07 ENCOUNTER — Encounter (HOSPITAL_COMMUNITY): Payer: Self-pay | Admitting: *Deleted

## 2016-10-07 NOTE — Pre-Procedure Instructions (Signed)
    Caleb DuboisJerald J Hays  10/07/2016      Your procedure is scheduled on Friday, March 23.  [facility] 11:40 AM                 Your surgery or procedure is scheduled for 2:10 PM   Call this number if you have problems the morning of surgery: [phone] pre op desk     Remember:  Do not eat food or drink liquids after midnight.  Take these medicines the morning of surgery with A SIP OF WATER: allopurinol (ZYLOPRIM),  famotidine (PEPCID), levothyroxine (SYNTHROID, LEVOTHROID), omeprazole (PRILOSEC).              May use Albuterol Inhaler if needed.  May take methocarbamol (ROBAXIN), oxyCODONE (OXY IR/ROXICODONE) if needed.   [facility] is not responsible for any belongings or valuables.                     PLEASE send Mediation Record with Administered Medications documented.

## 2016-10-07 NOTE — Progress Notes (Signed)
I spoke with [name], Mr Daughtrey's nurse.[name] reports that patient does not complain of chest pain or shortness of breath, patient transports in a wheelchair.

## 2016-10-08 ENCOUNTER — Other Ambulatory Visit: Payer: Self-pay | Admitting: *Deleted

## 2016-10-08 ENCOUNTER — Ambulatory Visit (HOSPITAL_COMMUNITY): Payer: Medicare HMO | Admitting: Certified Registered Nurse Anesthetist

## 2016-10-08 ENCOUNTER — Encounter (HOSPITAL_COMMUNITY)
Admission: RE | Disposition: A | Discharge status: Skilled Nursing Facility | Payer: Self-pay | Source: Ambulatory Visit | Attending: Orthopedic Surgery

## 2016-10-08 ENCOUNTER — Ambulatory Visit (HOSPITAL_COMMUNITY)
Admission: RE | Admit: 2016-10-08 | Discharge: 2016-10-12 | Disposition: A | Discharge status: Skilled Nursing Facility | Payer: Medicare HMO | Source: Ambulatory Visit | Attending: Orthopedic Surgery | Admitting: Orthopedic Surgery

## 2016-10-08 ENCOUNTER — Encounter (HOSPITAL_COMMUNITY): Payer: Self-pay | Admitting: Surgery

## 2016-10-08 DIAGNOSIS — Z89511 Acquired absence of right leg below knee: Secondary | ICD-10-CM | POA: Insufficient documentation

## 2016-10-08 DIAGNOSIS — R262 Difficulty in walking, not elsewhere classified: Secondary | ICD-10-CM | POA: Diagnosis not present

## 2016-10-08 DIAGNOSIS — I739 Peripheral vascular disease, unspecified: Secondary | ICD-10-CM

## 2016-10-08 DIAGNOSIS — J449 Chronic obstructive pulmonary disease, unspecified: Secondary | ICD-10-CM | POA: Insufficient documentation

## 2016-10-08 DIAGNOSIS — M868X7 Other osteomyelitis, ankle and foot: Secondary | ICD-10-CM | POA: Insufficient documentation

## 2016-10-08 DIAGNOSIS — I70245 Atherosclerosis of native arteries of left leg with ulceration of other part of foot: Secondary | ICD-10-CM | POA: Diagnosis present

## 2016-10-08 DIAGNOSIS — M1990 Unspecified osteoarthritis, unspecified site: Secondary | ICD-10-CM | POA: Diagnosis not present

## 2016-10-08 DIAGNOSIS — M109 Gout, unspecified: Secondary | ICD-10-CM | POA: Insufficient documentation

## 2016-10-08 DIAGNOSIS — I96 Gangrene, not elsewhere classified: Secondary | ICD-10-CM | POA: Diagnosis not present

## 2016-10-08 DIAGNOSIS — E039 Hypothyroidism, unspecified: Secondary | ICD-10-CM | POA: Insufficient documentation

## 2016-10-08 DIAGNOSIS — I70262 Atherosclerosis of native arteries of extremities with gangrene, left leg: Secondary | ICD-10-CM | POA: Diagnosis not present

## 2016-10-08 DIAGNOSIS — Z87891 Personal history of nicotine dependence: Secondary | ICD-10-CM | POA: Insufficient documentation

## 2016-10-08 DIAGNOSIS — L97529 Non-pressure chronic ulcer of other part of left foot with unspecified severity: Secondary | ICD-10-CM | POA: Diagnosis not present

## 2016-10-08 DIAGNOSIS — Z89432 Acquired absence of left foot: Secondary | ICD-10-CM

## 2016-10-08 HISTORY — DX: Hypothyroidism, unspecified: E03.9

## 2016-10-08 HISTORY — PX: AMPUTATION: SHX166

## 2016-10-08 LAB — BASIC METABOLIC PANEL
Anion gap: 9 (ref 5–15)
BUN: 16 mg/dL (ref 6–20)
CALCIUM: 8.9 mg/dL (ref 8.9–10.3)
CO2: 21 mmol/L — ABNORMAL LOW (ref 22–32)
CREATININE: 1.27 mg/dL — AB (ref 0.61–1.24)
Chloride: 111 mmol/L (ref 101–111)
GFR calc Af Amer: >60 mL/min (ref >60)
GFR calc non Af Amer: 54 mL/min — ABNORMAL LOW (ref >60)
GLUCOSE: 85 mg/dL (ref 65–99)
Potassium: 4.3 mmol/L (ref 3.5–5.1)
Sodium: 141 mmol/L (ref 135–145)

## 2016-10-08 LAB — CBC
HCT: 36.2 % — ABNORMAL LOW (ref 39.0–52.0)
HEMOGLOBIN: 11.4 g/dL — AB (ref 13.0–17.0)
MCH: 24.2 pg — ABNORMAL LOW (ref 26.0–34.0)
MCHC: 31.5 g/dL (ref 30.0–36.0)
MCV: 76.7 fL — ABNORMAL LOW (ref 78.0–100.0)
Platelets: 389 10*3/uL (ref 150–400)
RBC: 4.72 MIL/uL (ref 4.22–5.81)
RDW: 18 % — ABNORMAL HIGH (ref 11.5–15.5)
WBC: 17.3 10*3/uL — ABNORMAL HIGH (ref 4.0–10.5)

## 2016-10-08 SURGERY — AMPUTATION DIGIT
Anesthesia: Monitor Anesthesia Care | Site: Foot | Laterality: Left

## 2016-10-08 MED ORDER — LEVOTHYROXINE SODIUM 25 MCG PO TABS
12.5000 ug | ORAL_TABLET | Freq: Every day | ORAL | Status: DC
  Administered 2016-10-09 – 2016-10-12 (×4): 12.5 ug via ORAL
  Filled 2016-10-08 (×4): qty 1

## 2016-10-08 MED ORDER — FENTANYL CITRATE (PF) 100 MCG/2ML IJ SOLN
50.0000 ug | Freq: Once | INTRAMUSCULAR | Status: AC
  Administered 2016-10-08: 50 ug via INTRAVENOUS

## 2016-10-08 MED ORDER — ACETAMINOPHEN 325 MG PO TABS
650.0000 mg | ORAL_TABLET | Freq: Four times a day (QID) | ORAL | Status: DC | PRN
  Administered 2016-10-10: 650 mg via ORAL
  Filled 2016-10-08: qty 2

## 2016-10-08 MED ORDER — DEXTROSE 5 % IV SOLN
500.0000 mg | Freq: Four times a day (QID) | INTRAVENOUS | Status: DC | PRN
  Filled 2016-10-08: qty 5

## 2016-10-08 MED ORDER — BUPIVACAINE HCL (PF) 0.5 % IJ SOLN
INTRAMUSCULAR | Status: DC | PRN
  Administered 2016-10-08: 5 mL

## 2016-10-08 MED ORDER — POLYETHYLENE GLYCOL 3350 17 G PO PACK: 17.0000 g | PACK | Freq: Every day | ORAL | Status: DC | PRN

## 2016-10-08 MED ORDER — CEFAZOLIN SODIUM-DEXTROSE 2-4 GM/100ML-% IV SOLN
2.0000 g | INTRAVENOUS | Status: AC
  Administered 2016-10-08: 2 g via INTRAVENOUS
  Filled 2016-10-08: qty 100

## 2016-10-08 MED ORDER — POLYETHYLENE GLYCOL 3350 17 G PO PACK
17.0000 g | PACK | Freq: Every day | ORAL | Status: DC
  Filled 2016-10-08 (×3): qty 1

## 2016-10-08 MED ORDER — DOCUSATE SODIUM 100 MG PO CAPS
100.0000 mg | ORAL_CAPSULE | Freq: Two times a day (BID) | ORAL | Status: DC
  Administered 2016-10-08: 100 mg via ORAL
  Filled 2016-10-08 (×6): qty 1

## 2016-10-08 MED ORDER — TRAZODONE HCL 50 MG PO TABS
50.0000 mg | ORAL_TABLET | Freq: Every day | ORAL | Status: DC
  Administered 2016-10-08 – 2016-10-11 (×4): 50 mg via ORAL
  Filled 2016-10-08 (×4): qty 1

## 2016-10-08 MED ORDER — HYDROMORPHONE HCL 2 MG/ML IJ SOLN: 1.0000 mg | INTRAMUSCULAR | Status: DC | PRN

## 2016-10-08 MED ORDER — CLOPIDOGREL BISULFATE 75 MG PO TABS
75.0000 mg | ORAL_TABLET | Freq: Every day | ORAL | Status: DC
  Administered 2016-10-09 – 2016-10-11 (×3): 75 mg via ORAL
  Filled 2016-10-08 (×3): qty 1

## 2016-10-08 MED ORDER — ONDANSETRON HCL 4 MG/2ML IJ SOLN
4.0000 mg | Freq: Four times a day (QID) | INTRAMUSCULAR | Status: DC | PRN
  Administered 2016-10-11: 4 mg via INTRAVENOUS
  Filled 2016-10-08: qty 2

## 2016-10-08 MED ORDER — MAGNESIUM CITRATE PO SOLN: 1.0000 | Freq: Once | ORAL | Status: DC | PRN

## 2016-10-08 MED ORDER — LACTATED RINGERS IV SOLN
INTRAVENOUS | Status: DC
  Administered 2016-10-08: 12:00:00 via INTRAVENOUS

## 2016-10-08 MED ORDER — ALLOPURINOL 100 MG PO TABS
100.0000 mg | ORAL_TABLET | Freq: Every day | ORAL | Status: DC
  Administered 2016-10-09 – 2016-10-11 (×3): 100 mg via ORAL
  Filled 2016-10-08 (×3): qty 1

## 2016-10-08 MED ORDER — ALBUTEROL SULFATE (2.5 MG/3ML) 0.083% IN NEBU
2.5000 mg | INHALATION_SOLUTION | Freq: Two times a day (BID) | RESPIRATORY_TRACT | Status: DC
  Administered 2016-10-09: 2.5 mg via RESPIRATORY_TRACT
  Filled 2016-10-08: qty 3

## 2016-10-08 MED ORDER — MIDAZOLAM HCL 2 MG/2ML IJ SOLN
INTRAMUSCULAR | Status: AC
  Administered 2016-10-08: 1 mg via INTRAVENOUS
  Filled 2016-10-08: qty 2

## 2016-10-08 MED ORDER — OXYCODONE HCL 5 MG PO TABS
5.0000 mg | ORAL_TABLET | ORAL | Status: DC | PRN
  Administered 2016-10-09 (×2): 10 mg via ORAL
  Administered 2016-10-09 (×2): 5 mg via ORAL
  Administered 2016-10-10 (×4): 10 mg via ORAL
  Administered 2016-10-10 (×2): 5 mg via ORAL
  Administered 2016-10-11 – 2016-10-12 (×8): 10 mg via ORAL
  Filled 2016-10-08 (×2): qty 2
  Filled 2016-10-08 (×2): qty 1
  Filled 2016-10-08 (×4): qty 2
  Filled 2016-10-08: qty 1
  Filled 2016-10-08 (×4): qty 2
  Filled 2016-10-08: qty 1
  Filled 2016-10-08 (×5): qty 2

## 2016-10-08 MED ORDER — SODIUM CHLORIDE 0.9 % IV SOLN
INTRAVENOUS | Status: DC
  Administered 2016-10-08: 18:00:00 via INTRAVENOUS

## 2016-10-08 MED ORDER — BUPIVACAINE-EPINEPHRINE (PF) 0.5% -1:200000 IJ SOLN
INTRAMUSCULAR | Status: DC | PRN
  Administered 2016-10-08: 30 mL via PERINEURAL

## 2016-10-08 MED ORDER — METOCLOPRAMIDE HCL 5 MG PO TABS: 5.0000 mg | ORAL_TABLET | Freq: Three times a day (TID) | ORAL | Status: DC | PRN

## 2016-10-08 MED ORDER — PROPOFOL 10 MG/ML IV BOLUS
INTRAVENOUS | Status: DC | PRN
  Administered 2016-10-08: 20 mg via INTRAVENOUS
  Administered 2016-10-08: 10 mg via INTRAVENOUS

## 2016-10-08 MED ORDER — HYDROMORPHONE HCL 1 MG/ML IJ SOLN: 0.2500 mg | INTRAMUSCULAR | Status: DC | PRN

## 2016-10-08 MED ORDER — METHOCARBAMOL 500 MG PO TABS
500.0000 mg | ORAL_TABLET | Freq: Four times a day (QID) | ORAL | Status: DC | PRN
  Administered 2016-10-09 – 2016-10-11 (×5): 500 mg via ORAL
  Filled 2016-10-08 (×5): qty 1

## 2016-10-08 MED ORDER — GABAPENTIN 100 MG PO CAPS
200.0000 mg | ORAL_CAPSULE | Freq: Every day | ORAL | Status: DC
  Administered 2016-10-08 – 2016-10-11 (×4): 200 mg via ORAL
  Filled 2016-10-08 (×4): qty 2

## 2016-10-08 MED ORDER — PROPOFOL 500 MG/50ML IV EMUL
INTRAVENOUS | Status: DC | PRN
  Administered 2016-10-08: 40 ug/kg/min via INTRAVENOUS

## 2016-10-08 MED ORDER — FAMOTIDINE 20 MG PO TABS
20.0000 mg | ORAL_TABLET | Freq: Every day | ORAL | Status: DC
  Administered 2016-10-09 – 2016-10-11 (×3): 20 mg via ORAL
  Filled 2016-10-08 (×3): qty 1

## 2016-10-08 MED ORDER — METOCLOPRAMIDE HCL 5 MG/ML IJ SOLN: 5.0000 mg | Freq: Three times a day (TID) | INTRAMUSCULAR | Status: DC | PRN

## 2016-10-08 MED ORDER — FENTANYL CITRATE (PF) 100 MCG/2ML IJ SOLN
INTRAMUSCULAR | Status: AC
  Filled 2016-10-08: qty 2

## 2016-10-08 MED ORDER — PROPOFOL 10 MG/ML IV BOLUS
INTRAVENOUS | Status: AC
  Filled 2016-10-08: qty 20

## 2016-10-08 MED ORDER — 0.9 % SODIUM CHLORIDE (POUR BTL) OPTIME
TOPICAL | Status: DC | PRN
  Administered 2016-10-08: 1000 mL

## 2016-10-08 MED ORDER — FENTANYL CITRATE (PF) 100 MCG/2ML IJ SOLN
INTRAMUSCULAR | Status: AC
  Administered 2016-10-08: 50 ug via INTRAVENOUS
  Filled 2016-10-08: qty 2

## 2016-10-08 MED ORDER — ONDANSETRON HCL 4 MG PO TABS: 4.0000 mg | ORAL_TABLET | Freq: Four times a day (QID) | ORAL | Status: DC | PRN

## 2016-10-08 MED ORDER — ACETAMINOPHEN 650 MG RE SUPP: 650.0000 mg | Freq: Four times a day (QID) | RECTAL | Status: DC | PRN

## 2016-10-08 MED ORDER — CHLORHEXIDINE GLUCONATE 4 % EX LIQD: 60.0000 mL | Freq: Once | CUTANEOUS | Status: DC

## 2016-10-08 MED ORDER — PANTOPRAZOLE SODIUM 40 MG PO TBEC
40.0000 mg | DELAYED_RELEASE_TABLET | Freq: Every day | ORAL | Status: DC
  Administered 2016-10-09 – 2016-10-11 (×3): 40 mg via ORAL
  Filled 2016-10-08 (×3): qty 1

## 2016-10-08 MED ORDER — BISACODYL 10 MG RE SUPP: 10.0000 mg | Freq: Every day | RECTAL | Status: DC | PRN

## 2016-10-08 MED ORDER — MIDAZOLAM HCL 2 MG/2ML IJ SOLN
1.0000 mg | Freq: Once | INTRAMUSCULAR | Status: AC
  Administered 2016-10-08: 1 mg via INTRAVENOUS

## 2016-10-08 MED ORDER — SODIUM CHLORIDE 0.9 % IV SOLN: INTRAVENOUS | Status: DC

## 2016-10-08 MED ORDER — CEFAZOLIN IN D5W 1 GM/50ML IV SOLN
1.0000 g | Freq: Four times a day (QID) | INTRAVENOUS | Status: AC
  Administered 2016-10-08 – 2016-10-09 (×3): 1 g via INTRAVENOUS
  Filled 2016-10-08 (×3): qty 50

## 2016-10-08 MED ORDER — DOCUSATE SODIUM 100 MG PO CAPS
100.0000 mg | ORAL_CAPSULE | Freq: Two times a day (BID) | ORAL | Status: DC
  Administered 2016-10-09: 100 mg via ORAL
  Filled 2016-10-08: qty 1

## 2016-10-08 SURGICAL SUPPLY — 28 items
BLADE SURG 21 STRL SS (BLADE) ×3 IMPLANT
BNDG COHESIVE 4X5 TAN STRL (GAUZE/BANDAGES/DRESSINGS) ×3 IMPLANT
BNDG ESMARK 4X9 LF (GAUZE/BANDAGES/DRESSINGS) IMPLANT
BNDG GAUZE ELAST 4 BULKY (GAUZE/BANDAGES/DRESSINGS) ×3 IMPLANT
COVER SURGICAL LIGHT HANDLE (MISCELLANEOUS) ×6 IMPLANT
DRAPE INCISE IOBAN 66X45 STRL (DRAPES) ×6 IMPLANT
DRAPE U-SHAPE 47X51 STRL (DRAPES) ×3 IMPLANT
DRSG ADAPTIC 3X8 NADH LF (GAUZE/BANDAGES/DRESSINGS) IMPLANT
DRSG PAD ABDOMINAL 8X10 ST (GAUZE/BANDAGES/DRESSINGS) ×3 IMPLANT
DURAPREP 26ML APPLICATOR (WOUND CARE) ×3 IMPLANT
ELECT REM PT RETURN 9FT ADLT (ELECTROSURGICAL) ×3
ELECTRODE REM PT RTRN 9FT ADLT (ELECTROSURGICAL) ×1 IMPLANT
GAUZE SPONGE 4X4 12PLY STRL (GAUZE/BANDAGES/DRESSINGS) IMPLANT
GLOVE BIOGEL PI IND STRL 9 (GLOVE) ×1 IMPLANT
GLOVE BIOGEL PI INDICATOR 9 (GLOVE) ×2
GLOVE SURG ORTHO 9.0 STRL STRW (GLOVE) ×3 IMPLANT
GOWN STRL REUS W/ TWL XL LVL3 (GOWN DISPOSABLE) ×2 IMPLANT
GOWN STRL REUS W/TWL XL LVL3 (GOWN DISPOSABLE) ×4
KIT BASIN OR (CUSTOM PROCEDURE TRAY) ×3 IMPLANT
KIT ROOM TURNOVER OR (KITS) ×3 IMPLANT
MANIFOLD NEPTUNE II (INSTRUMENTS) ×3 IMPLANT
NEEDLE 22X1 1/2 (OR ONLY) (NEEDLE) IMPLANT
NS IRRIG 1000ML POUR BTL (IV SOLUTION) ×3 IMPLANT
PACK ORTHO EXTREMITY (CUSTOM PROCEDURE TRAY) ×3 IMPLANT
PAD ARMBOARD 7.5X6 YLW CONV (MISCELLANEOUS) ×6 IMPLANT
SUT ETHILON 2 0 PSLX (SUTURE) ×9 IMPLANT
SYR CONTROL 10ML LL (SYRINGE) IMPLANT
TOWEL OR 17X26 10 PK STRL BLUE (TOWEL DISPOSABLE) ×3 IMPLANT

## 2016-10-08 NOTE — Anesthesia Procedure Notes (Signed)
Anesthesia Regional Block: Popliteal block   Pre-Anesthetic Checklist: ,, timeout performed, Correct Patient, Correct Site, Correct Laterality, Correct Procedure, Correct Position, site marked, Risks and benefits discussed, pre-op evaluation,  At surgeon's request and post-op pain management  Laterality: Left  Prep: Maximum Sterile Barrier Precautions used, chloraprep       Needles:  Injection technique: Single-shot  Needle Type: Echogenic Stimulator Needle     Needle Length: 9cm  Needle Gauge: 21     Additional Needles:   Procedures: ultrasound guided, nerve stimulator,,,,,,   Nerve Stimulator or Paresthesia:  Response: Peroneal,  Response: Tibial,   Additional Responses:   Narrative:  Start time: 10/08/2016 1:21 PM End time: 10/08/2016 1:31 PM Injection made incrementally with aspirations every 5 mL. Anesthesiologist: Gaynelle AduFITZGERALD, Yoshiye Kraft  Additional Notes: 2% Lidocaine skin wheel. Saphenous block with 5cc of 0.5% Bupivicaine plain.

## 2016-10-08 NOTE — H&P (Signed)
Caleb MarinerJerald J Hays is an 76 y.o. male.   Chief Complaint: Osteomyelitis ulceration left great toe and third toe. HPI: Patient is a 76 year old gentleman with peripheral vascular disease who is status post transtibial amputation on the right who presents at this time with ulceration osteomyelitis left great toe and third toe.  Past Medical History:  Diagnosis Date  . Adult failure to thrive    /notes 06/23/2015  . Anemia   . Arthritis    "hands" (06/23/2015)  . COPD (chronic obstructive pulmonary disease) (HCC)    LONG TIME SMOKER  . Critical lower limb ischemia   . 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 Depression    "periods of depression" (06/23/2015)  . Gout   . Hypothyroidism   . Physical deconditioning   . Pyelonephritis 06/23/2015  . Status post foot surgery    right fifth toe amputation by Dr. Lajoyce Cornersuda     Past Surgical History:  Procedure Laterality Date  . ABDOMINAL AORTOGRAM W/LOWER EXTREMITY N/A 09/03/2016   Procedure: Abdominal Aortogram w/Lower Extremity;  Surgeon: Sherren Kernsharles E Fields, MD;  [facility];  Service: Cardiovascular;  Laterality: N/A;  Lt. leg  . AMPUTATION Right 03/01/2014   Procedure: AMPUTATION RAY;  Surgeon: Nadara MustardMarcus Jonica Bickhart V, MD;  Location: [facility] OR;  Service: Orthopedics;  Laterality: Right;  Right Foot 5th Ray Amputation  . AMPUTATION Right 04/26/2014   Procedure: Right Foot 4th Ray Amputation;  Surgeon: Nadara MustardMarcus Shanise Balch V, MD;  [facility];  Service: Orthopedics;  Laterality: Right;  . AMPUTATION Right 10/01/2015   Procedure: Right Below Knee Amputation;  Surgeon: Nadara MustardMarcus Ozelle Brubacher V, MD;  [facility];  Service: Orthopedics;  Laterality: Right;  . AMPUTATION Left 07/23/2016   Procedure: Left 2nd Toe Amputation;  Surgeon: Nadara MustardMarcus V Mykhia Danish, MD;  Location: [facility] OR;  Service: Orthopedics;  Laterality: Left;  . ANKLE ARTHROSCOPY Right 08/13/2015   Procedure: ANKLE ARTHROSCOPY;  Surgeon: Nadara MustardMarcus Jayston Trevino V, MD;  [facility];  Service:  Orthopedics;  Laterality: Right;  . CATARACT EXTRACTION W/ INTRAOCULAR LENS  IMPLANT, BILATERAL Bilateral   . INGUINAL HERNIA REPAIR Left   . KNEE CARTILAGE SURGERY Left 1960's   football injury  . KNEE LIGAMENT RECONSTRUCTION Left 1960's  . LOWER EXTREMITY ANGIOGRAM N/A 05/27/2014   Procedure: LOWER EXTREMITY ANGIOGRAM;  Surgeon: Runell GessJonathan J Berry, MD;  [facility];  Service: Cardiovascular;  Laterality: N/A;  . LOWER EXTREMITY ANGIOGRAPHY Left 09/15/2016   Procedure: Lower Extremity Angiography;  Surgeon: Maeola HarmanBrandon Christopher Cain, MD;  [facility];  Service: Cardiovascular;  Laterality: Left;  . PERIPHERAL VASCULAR INTERVENTION Left 09/15/2016   Procedure: Peripheral Vascular Intervention;  Surgeon: Maeola HarmanBrandon Christopher Cain, MD;  [facility];  Service: Cardiovascular;  Laterality: Left;  SFA/popiteal  . STUMP REVISION Right 12/05/2015   Procedure: Revision Right Below Knee Amputation;  Surgeon: Nadara MustardMarcus Marysol Wellnitz V, MD;  [facility];  Service: Orthopedics;  Laterality: Right;    Family History  Problem Relation Age of Onset  . Heart failure Father   . Heart attack Father   . Heart failure Brother    Social History:  reports that he has quit smoking. His smoking use included Cigarettes. He has a 14.25 pack-year smoking history. He has never used smokeless tobacco. He reports that he drinks alcohol. He reports that he does not use drugs.  Allergies: No Known Allergies  No prescriptions prior to admission.  No results found for this or any previous visit (from the past 48 hour(s)). No results found.  Review of Systems  All other systems reviewed and are negative.   There were no vitals taken for this visit. Physical Exam  On examination patient is alert oriented no adenopathy well-dressed normal affect normal respiratory effort has an antalgic gait semination he has ulceration ostium myelitis of the great toe and third  toe. Assessment/Plan Assessment: Peripheral vascular disease with osteomyelitis of the left great toe and third toe.  Plan: I recommended proceeding with a transmetatarsal amputation. Patient may only allow amputation of the great toe and third toe. We will evaluate at the time of surgery. Anticipate discharge to home postoperatively.  Nadara Mustard, MD 10/08/2016, 7:08 AM

## 2016-10-08 NOTE — Transfer of Care (Signed)
Immediate Anesthesia Transfer of Care Note  Patient: Caleb Hays  Procedure(s) Performed: Procedure(s): Left Transmetatarsal Amputation (Left)  Patient Location: PACU  Anesthesia Type:MAC combined with regional for post-op pain  Level of Consciousness: awake, alert , oriented and patient cooperative  Airway & Oxygen Therapy: Patient Spontanous Breathing  Post-op Assessment: Report given to RN and Post -op Vital signs reviewed and stable  Post vital signs: Reviewed and stable  Last Vitals:  Vitals:   10/08/16 1200 10/08/16 1449  BP: (!) 136/47 (!) 143/61  Pulse: 74 65  Resp: 18 (!) 21  Temp: 36.4 C 36.4 C    Last Pain:  Vitals:   10/08/16 1449  TempSrc:   PainSc: 0-No pain      Patients Stated Pain Goal: 4 (16/10/96 0454)  Complications: No apparent anesthesia complications

## 2016-10-08 NOTE — Anesthesia Postprocedure Evaluation (Signed)
Anesthesia Post Note  Patient: Caleb Hays  Procedure(s) Performed: Procedure(s) (LRB): Left Transmetatarsal Amputation (Left)  Patient location during evaluation: PACU Anesthesia Type: MAC Level of consciousness: awake and alert Pain management: pain level controlled Vital Signs Assessment: post-procedure vital signs reviewed and stable Respiratory status: spontaneous breathing, nonlabored ventilation, respiratory function stable and patient connected to nasal cannula oxygen Cardiovascular status: stable and blood pressure returned to baseline Anesthetic complications: no       Last Vitals:  Vitals:   10/08/16 1715 10/08/16 1722  BP:    Pulse: 79   Resp: 14   Temp:  (!) 36 C    Last Pain:  Vitals:   10/08/16 1722  TempSrc:   PainSc: 0-No pain                 Catalina Gravel

## 2016-10-08 NOTE — Progress Notes (Signed)
Patient has h/o renal issues and past creat of 1.4, decision made by Dr. Sampson GoonFitzgerald [facility]

## 2016-10-08 NOTE — Anesthesia Preprocedure Evaluation (Addendum)
Anesthesia Evaluation  Patient identified by MRN, date of birth, ID band Patient awake    Reviewed: Allergy & Precautions, H&P , NPO status , Patient's Chart, lab work & pertinent test results  Airway Mallampati: I  TM Distance: >3 FB Neck ROM: Full    Dental no notable dental hx. (+) Edentulous Upper, Edentulous Lower, Dental Advisory Given   Pulmonary COPD,  COPD inhaler, former smoker,    Pulmonary exam normal breath sounds clear to auscultation       Cardiovascular + Peripheral Vascular Disease   Rhythm:Regular Rate:Normal     Neuro/Psych Depression negative neurological ROS     GI/Hepatic Neg liver ROS, GERD  Medicated and Controlled,  Endo/Other  Hypothyroidism   Renal/GU Renal InsufficiencyRenal disease  negative genitourinary   Musculoskeletal  (+) Arthritis , Osteoarthritis,    Abdominal   Peds  Hematology negative hematology ROS (+) anemia ,   Anesthesia Other Findings   Reproductive/Obstetrics negative OB ROS                            Anesthesia Physical Anesthesia Plan  ASA: III  Anesthesia Plan: Regional and MAC   Post-op Pain Management:    Induction: Intravenous  Airway Management Planned: Simple Face Mask  Additional Equipment:   Intra-op Plan:   Post-operative Plan:   Informed Consent: I have reviewed the patients History and Physical, chart, labs and discussed the procedure including the risks, benefits and alternatives for the proposed anesthesia with the patient or authorized representative who has indicated his/her understanding and acceptance.   Dental advisory given  Plan Discussed with: CRNA  Anesthesia Plan Comments:        Anesthesia Quick Evaluation

## 2016-10-08 NOTE — Op Note (Signed)
     Date of Surgery: 10/08/2016  INDICATIONS: Mr. Caleb Hays is a 76 y.o.-year-old male who has peripheral vascular disease status post toe amputations now with gangrene of the remaining toes and presents for transmetatarsal amputation.Marland Kitchen.  PREOPERATIVE DIAGNOSIS: Gangrene left forefoot  POSTOPERATIVE DIAGNOSIS: Same.  PROCEDURE: Transmetatarsal amputation Application of Prevena wound VAC  SURGEON: Lajoyce Cornersuda, M.D.  ANESTHESIA:  general  IV FLUIDS AND URINE: See anesthesia.  ESTIMATED BLOOD LOSS: Minimal mL.  COMPLICATIONS: None.  DESCRIPTION OF PROCEDURE: The patient was brought to the operating room and underwent a general anesthetic. After adequate levels of anesthesia were obtained patient's lower extremity was prepped using DuraPrep draped into a sterile field. A timeout was called.  A fishmouth incision was made just proximal to the ulcerative nonviable tissue. This was carried sharply down to bone. A oscillating saw was used to perform a transmetatarsal amputation with a gentle cascade of the metatarsals and beveled plantarly. Electrocautery was used for hemostasis. The wound was irrigated with normal saline. The incision was closed using 2-0 nylon. A Prevena wound VAC was applied. This had a good suction fit. Patient was taken to the PACU in stable condition.  Caleb BakerMarcus Maude Gloor, MD [facility] 2:38 PM

## 2016-10-09 ENCOUNTER — Encounter (HOSPITAL_COMMUNITY): Payer: Self-pay | Admitting: Orthopedic Surgery

## 2016-10-09 DIAGNOSIS — I70262 Atherosclerosis of native arteries of extremities with gangrene, left leg: Secondary | ICD-10-CM | POA: Diagnosis not present

## 2016-10-09 MED ORDER — ALBUTEROL SULFATE (2.5 MG/3ML) 0.083% IN NEBU
2.5000 mg | INHALATION_SOLUTION | Freq: Four times a day (QID) | RESPIRATORY_TRACT | Status: DC | PRN
Start: 2016-10-09 — End: 2016-10-12
  Administered 2016-10-10: 2.5 mg via RESPIRATORY_TRACT
  Filled 2016-10-09: qty 3

## 2016-10-09 NOTE — Evaluation (Signed)
Physical Therapy Evaluation/Discharge Patient Details Name: Caleb Hays MRN: [id number] DOB: 27-Oct-1940 Today's Date: 10/09/2016   History of Present Illness  Pt is a 76 yo male admitted for L transmetatarsal ampuation on 10/08/16. 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   Clinical Impression  Pt is POD 1 following the above procedure. Prior to admission, pt was staying at [facility] where he was mobilizing at a WC level. Pt would like to return to SNF to continue his therapy once he is cleared by MD with recent amputation. Pt will benefit from continued therapy at SNF in order to work on transfers and mobility with recent amputation. No further acute needs are noted at this time and all therapy can be completed at [facility].     Follow Up Recommendations SNF;Supervision/Assistance - 24 hour    Equipment Recommendations  None recommended by PT    Recommendations for Other Services       Precautions / Restrictions Precautions Precautions: None Restrictions Weight Bearing Restrictions: Yes LLE Weight Bearing: Touchdown weight bearing      Mobility  Bed Mobility Overal bed mobility: Modified Independent             General bed mobility comments: able to get EOB using rails with no HH assistance  Transfers                 General transfer comment: Did not attempt, Pt refused this session  Ambulation/Gait                Stairs            Wheelchair Mobility    Modified Rankin (Stroke Patients Only)       Balance                                             Pertinent Vitals/Pain Pain Assessment: 0-10 Pain Score: 3  Pain Location: Left foot Pain Descriptors / Indicators: Aching;Sharp;Sore    Home Living Family/patient expects to be discharged to:: Skilled nursing facility [facility])                 Additional Comments: Has lived in [place]  place for past year    Prior Function Level of Independence: Needs assistance   Gait / Transfers Assistance Needed: was working on ambulation with prosthesis at SNF, able to get around in [facility] without assistance  ADL's / Homemaking Assistance Needed: Needs ~50% assist to bathe and dress.  Comments: Pt is limited with gait due to recent amputation and will require assistance for adls and iadls     Hand Dominance   Dominant Hand: Right    Extremity/Trunk Assessment   Upper Extremity Assessment Upper Extremity Assessment: Overall WFL for tasks assessed    Lower Extremity Assessment Lower Extremity Assessment: LLE deficits/detail LLE Deficits / Details: Pt is at least 4/5 LLE     Cervical / Trunk Assessment Cervical / Trunk Assessment: Normal  Communication   Communication: HOH  Cognition Arousal/Alertness: Awake/alert Behavior During Therapy: WFL for tasks assessed/performed Overall Cognitive Status: Within Functional Limits for tasks assessed  General Comments      Exercises     Assessment/Plan    PT Assessment All further PT needs can be met in the next venue of care  PT Problem List Decreased activity tolerance;Decreased balance;Decreased mobility;Pain       PT Treatment Interventions      PT Goals (Current goals can be found in the Care Plan section)  Acute Rehab PT Goals Patient Stated Goal: to get back to rehab to continue working on walking PT Goal Formulation: With patient Time For Goal Achievement: 10/16/16 Potential to Achieve Goals: Good    Frequency     Barriers to discharge        Co-evaluation               End of Session Equipment Utilized During Treatment: Gait belt Activity Tolerance: Patient limited by fatigue Patient left: in bed;with call bell/phone within reach;with SCD's reapplied Nurse Communication: Mobility status PT Visit Diagnosis: Difficulty in walking, not  elsewhere classified (R26.2);Pain Pain - Right/Left: Left Pain - part of body: Ankle and joints of foot    Time: 1031-2811 PT Time Calculation (min) (ACUTE ONLY): 15 min   Charges:   PT Evaluation $PT Eval Low Complexity: 1 Procedure     PT G Codes:   PT G-Codes **NOT FOR INPATIENT CLASS** Functional Assessment Tool Used: AM-PAC 6 Clicks Basic Mobility;Clinical judgement Functional Limitation: Mobility: Walking and moving around Mobility: Walking and Moving Around Current Status (W8677): At least 40 percent but less than 60 percent impaired, limited or restricted Mobility: Walking and Moving Around Goal Status [phone]): At least 1 percent but less than 20 percent impaired, limited or restricted    Scheryl Marten PT, DPT  [phone]   Shanon Rosser 10/09/2016, 2:42 PM

## 2016-10-09 NOTE — Plan of Care (Signed)
Problem: Safety: Goal: Ability to remain free from injury will improve Outcome: Progressing No safety issues noted  Problem: Pain Managment: Goal: General experience of comfort will improve Outcome: Progressing Denies pain, refused pain medication  Problem: Skin Integrity: Goal: Risk for impaired skin integrity will decrease Outcome: Progressing Wound vac dressing clean, dry and intact to left foot incision  Problem: Tissue Perfusion: Goal: Risk factors for ineffective tissue perfusion will decrease Outcome: Progressing Denies signs of DVT  Problem: Bowel/Gastric: Goal: Will not experience complications related to bowel motility Outcome: Progressing Denies gastric and bowel issues

## 2016-10-10 DIAGNOSIS — I70262 Atherosclerosis of native arteries of extremities with gangrene, left leg: Secondary | ICD-10-CM | POA: Diagnosis not present

## 2016-10-10 NOTE — Progress Notes (Signed)
Subjective: 2 Days Post-Op Procedure(s) (LRB): Left Transmetatarsal Amputation (Left) Patient reports pain as mild and moderate.    Objective: Vital signs in last 24 hours: Temp:  [97.9 F (36.6 C)-98.8 F (37.1 C)] 97.9 F (36.6 C) (03/25 0620) Pulse Rate:  [63-73] 64 (03/25 0620) Resp:  [16] 16 (03/24 1615) BP: (100-141)/(45-66) 126/56 (03/25 0620) SpO2:  [96 %-99 %] 98 % (03/25 0620)  Intake/Output from previous day: 03/24 0701 - 03/25 0700 In: 720 [P.O.:720] Out: 1200 [Urine:1200] Intake/Output this shift: No intake/output data recorded.   Recent Labs  10/08/16 1211  HGB 11.4*    Recent Labs  10/08/16 1211  WBC 17.3*  RBC 4.72  HCT 36.2*  PLT 389    Recent Labs  10/08/16 1211  NA 141  K 4.3  CL 111  CO2 21*  BUN 16  CREATININE 1.27*  GLUCOSE 85  CALCIUM 8.9   No results for input(s): LABPT, INR in the last 72 hours.  wound vac in place  Assessment/Plan: 2 Days Post-Op Procedure(s) (LRB): Left Transmetatarsal Amputation (Left) Continue pain meds and VAC   Jacqualine CodeBrian Petrarca 10/10/2016, 9:44 AM

## 2016-10-10 NOTE — [place] FL2 (Signed)
[place] MEDICAID FL2 LEVEL OF CARE SCREENING TOOL     IDENTIFICATION  Patient Name: Caleb Hays Birthdate: [birth year] Sex: male Admission Date (Current Location): 10/08/2016  [place] [facility] [place]   Facility and Address:  [facility], [address], [place], [place] [address]      Provider Number: [id number]  Attending Physician Name and Address:  Nadara MustardMarcus Duda V, MD  Relative Name and Phone Number:  Daun PeacockLauren [name], [phone]    Current Level of Care: Hospital Recommended Level of Care: Skilled Nursing Facility Prior Approval Number:    Date Approved/Denied:   PASRR Number: [id number]  Discharge Plan: SNF    Current Diagnoses: Patient Active Problem List   Diagnosis Date Noted  . S/P transmetatarsal amputation of foot, left (HCC) 10/08/2016  . PAD (peripheral artery disease) (HCC) 09/15/2016  . Hx of amputation of lesser toe, left (HCC) 08/06/2016  . Gangrene of toe of left foot (HCC)   . Toe infection 07/22/2016  . Osteomyelitis (HCC) 07/22/2016  . Skin ulcer of second toe of left foot (HCC) 07/01/2016  . Edema 05/05/2016  . Phantom pain 03/08/2016  . 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 Gout 03/08/2016  . Hypothyroidism (acquired) 03/08/2016  . CKD (chronic kidney disease) stage 3, GFR 30-59 ml/min 03/08/2016  . GERD without esophagitis 03/08/2016  . Constipation 12/29/2015  . Below knee amputation status (HCC) 10/01/2015  . Ankle pain 08/13/2015  . Adult failure to thrive   . PVD (peripheral vascular disease) (HCC)   . Elevated blood uric acid level 08/10/2015  . 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 Anemia of chronic disease 08/10/2015  . Malnutrition of moderate degree 06/24/2015  . Failure to thrive in adult 06/23/2015  . Weight loss 06/23/2015  . Tobacco abuse 06/23/2015  . COPD (chronic obstructive pulmonary disease) (HCC) 06/23/2015  . Fall   . Critical lower limb ischemia  05/31/2014  . Status post right foot surgery 03/12/2014    Orientation RESPIRATION BLADDER Height & Weight     Self, Time, Situation, Place  Normal Continent Weight: 170 lb (77.1 kg) Height:  6\' 4"  (193 cm)  BEHAVIORAL SYMPTOMS/MOOD NEUROLOGICAL BOWEL NUTRITION STATUS      Continent Diet (Diet regular)  AMBULATORY STATUS COMMUNICATION OF NEEDS Skin   Limited Assist Verbally Normal                       Personal Care Assistance Level of Assistance  Bathing, Feeding, Dressing Bathing Assistance: Limited assistance Feeding assistance: Independent Dressing Assistance: Limited assistance     Functional Limitations Info  Sight, Hearing, Speech Sight Info: Adequate Hearing Info: Adequate Speech Info: Adequate    SPECIAL CARE FACTORS FREQUENCY  PT (By licensed PT), OT (By licensed OT)     PT Frequency: 5x week OT Frequency: 5x week            Contractures      Additional Factors Info  Code Status Code Status Info: Full Code             Current Medications (10/10/2016):  This is the current hospital active medication list Current Facility-Administered Medications  Medication Dose Route Frequency Provider Last Rate Last Dose  . acetaminophen (TYLENOL) tablet 650 mg  650 mg Oral Q6H PRN Nadara MustardMarcus Duda V, MD   650 mg at 10/10/16 1011   Or  . acetaminophen (TYLENOL) suppository 650 mg  650 mg Rectal  Q6H PRN Nadara Mustard, MD      . albuterol (PROVENTIL) (2.5 MG/3ML) 0.083% nebulizer solution 2.5 mg  2.5 mg Inhalation Q6H PRN Nadara Mustard, MD      . allopurinol (ZYLOPRIM) tablet 100 mg  100 mg Oral Daily Nadara Mustard, MD   100 mg at 10/10/16 1010  . bisacodyl (DULCOLAX) suppository 10 mg  10 mg Rectal Daily PRN Nadara Mustard, MD      . clopidogrel (PLAVIX) tablet 75 mg  75 mg Oral Q breakfast Nadara Mustard, MD   75 mg at 10/10/16 1011  . docusate sodium (COLACE) capsule 100 mg  100 mg Oral BID Nadara Mustard, MD   100 mg at 10/08/16 2046  . docusate sodium (COLACE)  capsule 100 mg  100 mg Oral BID Nadara Mustard, MD   100 mg at 10/09/16 2152  . famotidine (PEPCID) tablet 20 mg  20 mg Oral Daily Nadara Mustard, MD   20 mg at 10/10/16 1011  . gabapentin (NEURONTIN) capsule 200 mg  200 mg Oral QHS Nadara Mustard, MD   200 mg at 10/09/16 2152  . HYDROmorphone (DILAUDID) injection 1 mg  1 mg Intravenous Q2H PRN Nadara Mustard, MD      . levothyroxine (SYNTHROID, LEVOTHROID) tablet 12.5 mcg  12.5 mcg Oral Q0600 Nadara Mustard, MD   12.5 mcg at 10/10/16 [phone]  . magnesium citrate solution 1 Bottle  1 Bottle Oral Once PRN Nadara Mustard, MD      . methocarbamol (ROBAXIN) tablet 500 mg  500 mg Oral Q6H PRN Nadara Mustard, MD   500 mg at 10/10/16 9604   Or  . methocarbamol (ROBAXIN) 500 mg in dextrose 5 % 50 mL IVPB  500 mg Intravenous Q6H PRN Nadara Mustard, MD      . metoCLOPramide (REGLAN) tablet 5-10 mg  5-10 mg Oral Q8H PRN Nadara Mustard, MD       Or  . metoCLOPramide (REGLAN) injection 5-10 mg  5-10 mg Intravenous Q8H PRN Nadara Mustard, MD      . ondansetron [facility]) tablet 4 mg  4 mg Oral Q6H PRN Nadara Mustard, MD       Or  . ondansetron [facility]) injection 4 mg  4 mg Intravenous Q6H PRN Aldean Baker V, MD      . oxyCODONE (Oxy IR/ROXICODONE) immediate release tablet 5-10 mg  5-10 mg Oral Q3H PRN Nadara Mustard, MD   10 mg at 10/10/16 0909  . pantoprazole (PROTONIX) EC tablet 40 mg  40 mg Oral Daily Nadara Mustard, MD   40 mg at 10/10/16 1010  . polyethylene glycol (MIRALAX / GLYCOLAX) packet 17 g  17 g Oral Daily Aldean Baker V, MD      . polyethylene glycol (MIRALAX / GLYCOLAX) packet 17 g  17 g Oral Daily PRN Nadara Mustard, MD      . traZODone (DESYREL) tablet 50 mg  50 mg Oral QHS Nadara Mustard, MD   50 mg at 10/09/16 2152     Discharge Medications: Please see discharge summary for a list of discharge medications.  Relevant Imaging Results:  Relevant Lab Results:   Additional Information SS#[id number]  Althea Charon, LCSW

## 2016-10-10 NOTE — Clinical Social Work Note (Signed)
Clinical Social Work Assessment  Patient Details  Name: Caleb Hays MRN: [id number] Date of Birth: 06-24-1941  Date of referral:  10/10/16               Reason for consult:  Discharge Planning                Permission sought to share information with:  Family Supports Permission granted to share information::  Yes, Verbal Permission Granted  Name::     Alfonso Ramus  Agency::     Relationship::  niece  Contact Information:  [phone]  Housing/Transportation Living arrangements for the past 2 months:  [place] of Information:  Patient Patient Interpreter Needed:  None Criminal Activity/Legal Involvement Pertinent to Current Situation/Hospitalization:  No - Comment as needed Significant Relationships:  Other Family Members Lives with:  Facility Resident Do you feel safe going back to the place where you live?  Yes Need for family participation in patient care:  Yes (Comment)  Care giving concerns:  Patient has some family support   Facilities manager / plan:  CSW met patient at the bedside to offer support and discuss discharge needs. Patient stated he lives in [place] under their long term care. Patient stated his been living their for over a year. Patient stated he "hates the food and the young staff is not great" but is willing to go back. Patient stated facility is aware of him being at [facility] Employment status:  Retired Forensic scientist:  Medicare PT Recommendations:  [place] / Referral to community resources:  [place]  Patient/Family's Response to care:  Patient verbalized appreciation and understanding for CSW role and involvement in care.   Patient/Family's Understanding of and Emotional Response to Diagnosis, Current Treatment, and Prognosis:  Patient with good understanding of current medical state and limitations around most recent hospitalization.  Emotional  Assessment Appearance:  Appears stated age Attitude/Demeanor/Rapport:  Other Affect (typically observed):  Pleasant Orientation:  Oriented to Situation, Oriented to  Time, Oriented to Place, Oriented to Self Alcohol / Substance use:  Not Applicable Psych involvement (Current and /or in the community):  No (Comment)  Discharge Needs  Concerns to be addressed:  No discharge needs identified Readmission within the last 30 days:  No Current discharge risk:  None Barriers to Discharge:  No Barriers Identified   Wende Neighbors, LCSW 10/10/2016, 12:24 PM

## 2016-10-10 NOTE — Clinical Social Work Placement (Signed)
   CLINICAL SOCIAL WORK PLACEMENT  NOTE  Date:  10/10/2016  Patient Details  Name: Caleb Hays MRN: [id number] Date of Birth: 1940-10-26  Clinical Social Work is seeking post-discharge placement for this patient at the Skilled  Nursing Facility level of care (*CSW will initial, date and re-position this form in  chart as items are completed):  No   Patient/family provided with [facility] Clinical Social Work Department's list of facilities offering this level of care within the geographic area requested by the patient (or if unable, by the patient's family).  Yes   Patient/family informed of their freedom to choose among providers that offer the needed level of care, that participate in Medicare, Medicaid or managed care program needed by the patient, have an available bed and are willing to accept the patient.  Yes   Patient/family informed of [place]'s ownership interest in [facility] and [facility], as well as of the fact that they are under no obligation to receive care at these facilities.  PASRR submitted to EDS on       PASRR number received on       Existing PASRR number confirmed on       FL2 transmitted to all facilities in geographic area requested by pt/family on       FL2 transmitted to all facilities within larger geographic area on       Patient informed that his/her managed care company has contracts with or will negotiate with certain facilities, including the following:            Patient/family informed of bed offers received.  Patient chooses bed at       Physician recommends and patient chooses bed at      Patient to be transferred to   on  .  Patient to be transferred to facility by       Patient family notified on   of transfer.  Name of family member notified:        PHYSICIAN Please sign FL2     Additional Comment:    _______________________________________________ Althea CharonAshley C Mckensey Berghuis, LCSW 10/10/2016, 12:09 PM

## 2016-10-11 ENCOUNTER — Encounter (HOSPITAL_COMMUNITY): Payer: Medicare HMO

## 2016-10-11 DIAGNOSIS — I70262 Atherosclerosis of native arteries of extremities with gangrene, left leg: Secondary | ICD-10-CM | POA: Diagnosis not present

## 2016-10-11 LAB — GLUCOSE, CAPILLARY: Glucose-Capillary: 80 mg/dL (ref 65–99)

## 2016-10-11 MED ORDER — OXYCODONE-ACETAMINOPHEN 5-325 MG PO TABS: 1.0000 | ORAL_TABLET | ORAL | 0 refills | Status: DC | PRN

## 2016-10-11 NOTE — Discharge Summary (Signed)
Discharge Diagnoses:  Active Problems:   S/P transmetatarsal amputation of foot, left (HCC)   Surgeries: Procedure(s): Left Transmetatarsal Amputation on 10/08/2016    Consultants:   Discharged Condition: Improved  Hospital Course: Caleb Hays is an 76 y.o. male who was admitted 10/08/2016 with a chief complaint of gangrene of the lesser toes left foot, with a final diagnosis of Gangrene Left Foot.  Patient was brought to the operating room on 10/08/2016 and underwent Procedure(s): Left Transmetatarsal Amputation.    Patient was given perioperative antibiotics: Anti-infectives    Start     Dose/Rate Route Frequency Ordered Stop   10/08/16 2000  ceFAZolin (ANCEF) IVPB 1 g/50 mL premix     1 g 100 mL/hr over 30 Minutes Intravenous Every 6 hours 10/08/16 1739 10/09/16 0907   10/08/16 0623  ceFAZolin (ANCEF) IVPB 2g/100 mL premix     2 g 200 mL/hr over 30 Minutes Intravenous On call to O.R. 10/08/16 1610 10/08/16 1415    .  Patient was given sequential compression devices, early ambulation, and aspirin for DVT prophylaxis.  Recent vital signs: Patient Vitals for the past 24 hrs:  BP Temp Temp src Pulse Resp SpO2  10/11/16 0436 (!) 149/70 97.7 F (36.5 C) Oral 64 16 96 %  10/10/16 2116 127/74 97.6 F (36.4 C) Oral 67 16 97 %  10/10/16 1539 126/62 98 F (36.7 C) Oral 67 16 94 %  .  Recent laboratory studies: No results found.  Discharge Medications:   Allergies as of 10/11/2016   No Known Allergies     Medication List    STOP taking these medications   silver sulfADIAZINE 1 % cream Commonly known as:  SILVADENE     TAKE these medications   albuterol 108 (90 Base) MCG/ACT inhaler Commonly known as:  PROVENTIL HFA;VENTOLIN HFA Inhale 2 puffs into the lungs 2 (two) times daily. 0900 & 1700   allopurinol 100 MG tablet Commonly known as:  ZYLOPRIM Take 100 mg by mouth daily. 0900   cholecalciferol 1000 units tablet Commonly known as:  VITAMIN D Take 1,000 Units  by mouth daily. (0900)   clopidogrel 75 MG tablet Commonly known as:  PLAVIX Take 1 tablet (75 mg total) by mouth daily with breakfast.   docusate sodium 100 MG capsule Commonly known as:  COLACE Take 100 mg by mouth 2 (two) times daily. 0900 & 1700   doxycycline 100 MG tablet Commonly known as:  VIBRA-TABS Take 100 mg by mouth 2 (two) times daily. Starting 09/28/16 then to resume previous order 1 by mouth daily for for left foot infection   famotidine 20 MG tablet Commonly known as:  PEPCID Take 20 mg by mouth daily. 0900   gabapentin 100 MG capsule Commonly known as:  NEURONTIN Take 200 mg by mouth at bedtime. (2100)   levothyroxine 25 MCG tablet Commonly known as:  SYNTHROID, LEVOTHROID Take 12.5 mcg by mouth daily at 6 (six) AM.   loperamide 2 MG tablet Commonly known as:  IMODIUM A-D Take 2-4 mg by mouth every 4 (four) hours as needed for diarrhea or loose stools (Do not give if <3 stools or DX. w/c. diff or norovirus. Do not exceed 16 mg/24hrs.).   meloxicam 7.5 MG tablet Commonly known as:  MOBIC Take 7.5 mg by mouth daily. 0900   methocarbamol 500 MG tablet Commonly known as:  ROBAXIN Take 500 mg by mouth every 6 (six) hours as needed for muscle spasms.   multivitamin with minerals tablet  Take 1 tablet by mouth daily. (0900)   nitroGLYCERIN 0.2 mg/hr patch Commonly known as:  NITRODUR - Dosed in mg/24 hr Place 1 patch (0.2 mg total) onto the skin daily. Rotate site along dorsum of foot daily.   omeprazole 20 MG capsule Commonly known as:  PRILOSEC Take 20 mg by mouth daily at 6 (six) AM.   oxyCODONE 5 MG immediate release tablet Commonly known as:  Oxy IR/ROXICODONE 1 by mouth every 4 hours as needed for pain, 1-2 by mouth at bedtime   oxyCODONE-acetaminophen 5-325 MG tablet Commonly known as:  ROXICET Take 1 tablet by mouth every 4 (four) hours as needed for severe pain.   polyethylene glycol packet Commonly known as:  MIRALAX / GLYCOLAX Take 17 g by  mouth daily. (0900) Hold for loose stool   saccharomyces boulardii 250 MG capsule Commonly known as:  FLORASTOR Take 250 mg by mouth 2 (two) times daily.   senna 8.6 MG Tabs tablet Commonly known as:  SENOKOT Take 1 tablet (8.6 mg total) by mouth 2 (two) times daily.   traZODone 50 MG tablet Commonly known as:  DESYREL Take 50 mg by mouth at bedtime. (2100)   UNABLE TO FIND Med Name: Medpass 90 ml twice daily for weight loss       Diagnostic Studies: No results found.  Patient benefited maximally from their hospital stay and there were no complications.     Disposition: 03-Skilled Nursing Facility Discharge Instructions    Call MD / Call 911    Complete by:  As directed    If you experience chest pain or shortness of breath, CALL 911 and be transported to the hospital emergency room.  If you develope a fever above 101 F, pus (white drainage) or increased drainage or redness at the wound, or calf pain, call your surgeon's office.   Call MD / Call 911    Complete by:  As directed    If you experience chest pain or shortness of breath, CALL 911 and be transported to the hospital emergency room.  If you develope a fever above 101 F, pus (white drainage) or increased drainage or redness at the wound, or calf pain, call your surgeon's office.   Constipation Prevention    Complete by:  As directed    Drink plenty of fluids.  Prune juice may be helpful.  You may use a stool softener, such as Colace (over the counter) 100 mg twice a day.  Use MiraLax (over the counter) for constipation as needed.   Constipation Prevention    Complete by:  As directed    Drink plenty of fluids.  Prune juice may be helpful.  You may use a stool softener, such as Colace (over the counter) 100 mg twice a day.  Use MiraLax (over the counter) for constipation as needed.   Diet - low sodium heart healthy    Complete by:  As directed    Diet - low sodium heart healthy    Complete by:  As directed    Elevate  operative extremity    Complete by:  As directed    Increase activity slowly as tolerated    Complete by:  As directed    Increase activity slowly as tolerated    Complete by:  As directed    Negative Pressure Wound Therapy - Incisional    Complete by:  As directed    Post-op shoe    Complete by:  As directed  Touch down weight bearing    Complete by:  As directed      Follow-up Information    Nadara Mustard, MD Follow up in 1 week(s).   Specialty:  Orthopedic Surgery Why:  Call for follow-up appointment for Thursday Contact information: [address] [place] [address] [phone]            Signed: Nadara Mustard 10/11/2016, 6:42 AM

## 2016-10-11 NOTE — Clinical Social Work Note (Signed)
Clinical Social Worker facilitated patient discharge including contacting patient family and facility to confirm patient discharge plans.  Clinical information faxed to facility and family agreeable with plan.  CSW arranged ambulance transport via [facility] to [facility] .  RN to call [phone] for report prior to discharge.  Clinical Social Worker will sign off for now as social work intervention is no longer needed. Please consult us again if new need arises.  Consandra Laske [place], LCSWA [phone]    

## 2016-10-12 ENCOUNTER — Telehealth: Payer: Self-pay

## 2016-10-12 DIAGNOSIS — I70262 Atherosclerosis of native arteries of extremities with gangrene, left leg: Secondary | ICD-10-CM | POA: Diagnosis not present

## 2016-10-12 NOTE — Progress Notes (Signed)
[facility]  Caroly Purewal, Kae HellerMiranda Lynn, RN

## 2016-10-12 NOTE — Discharge Summary (Signed)
  Patient's discharge was delayed due to transportation issues. No change in patients status from discharge summary dictated yesterday no change in medications. Follow-up in the office as scheduled.

## 2016-10-12 NOTE — OR Nursing (Signed)
10/03/16 1445: allevyn sacral pad placed prophylactically post-operatively. Skin slightly reddened and somewhat less than supple at this time but undbroken.

## 2016-10-12 NOTE — Telephone Encounter (Signed)
This is a patient of [facility], who was [facility]. [facility] on 10/12/16.

## 2016-10-13 ENCOUNTER — Encounter: Payer: Medicare HMO | Admitting: Vascular Surgery

## 2016-10-14 ENCOUNTER — Telehealth (INDEPENDENT_AMBULATORY_CARE_PROVIDER_SITE_OTHER): Payer: Self-pay | Admitting: *Deleted

## 2016-10-14 ENCOUNTER — Ambulatory Visit (INDEPENDENT_AMBULATORY_CARE_PROVIDER_SITE_OTHER): Payer: Medicare HMO | Admitting: Orthopedic Surgery

## 2016-10-14 ENCOUNTER — Encounter: Payer: Self-pay | Admitting: Internal Medicine

## 2016-10-14 ENCOUNTER — Non-Acute Institutional Stay (SKILLED_NURSING_FACILITY): Payer: Medicare HMO | Admitting: Internal Medicine

## 2016-10-14 DIAGNOSIS — N183 Chronic kidney disease, stage 3 unspecified: Secondary | ICD-10-CM

## 2016-10-14 DIAGNOSIS — G546 Phantom limb syndrome with pain: Secondary | ICD-10-CM

## 2016-10-14 DIAGNOSIS — R2681 Unsteadiness on feet: Secondary | ICD-10-CM

## 2016-10-14 DIAGNOSIS — K5903 Drug induced constipation: Secondary | ICD-10-CM

## 2016-10-14 DIAGNOSIS — R1114 Bilious vomiting: Secondary | ICD-10-CM | POA: Diagnosis not present

## 2016-10-14 DIAGNOSIS — D638 Anemia in other chronic diseases classified elsewhere: Secondary | ICD-10-CM

## 2016-10-14 DIAGNOSIS — J449 Chronic obstructive pulmonary disease, unspecified: Secondary | ICD-10-CM

## 2016-10-14 DIAGNOSIS — Z89432 Acquired absence of left foot: Secondary | ICD-10-CM

## 2016-10-14 DIAGNOSIS — M86272 Subacute osteomyelitis, left ankle and foot: Secondary | ICD-10-CM | POA: Diagnosis not present

## 2016-10-14 DIAGNOSIS — E039 Hypothyroidism, unspecified: Secondary | ICD-10-CM | POA: Diagnosis not present

## 2016-10-14 DIAGNOSIS — I739 Peripheral vascular disease, unspecified: Secondary | ICD-10-CM | POA: Diagnosis not present

## 2016-10-14 DIAGNOSIS — R52 Pain, unspecified: Secondary | ICD-10-CM

## 2016-10-14 NOTE — Progress Notes (Signed)
Office Visit Note   Patient: Caleb MarinerJerald J Hays           Date of Birth: 08-20-1940           MRN: [id number] Visit Date: 10/14/2016              Requested by: Oneal GroutMahima Pandey, MD [address] [place], [place] [id number] PCP: [name], [name], MD  No chief complaint on file.     HPI: The patient is a 76 year old gentleman who presents today one week status post left transmetatarsal amputation. This is well approximated at this time.  vac was removed today.    Assessment & Plan: Visit Diagnoses:  1. S/P transmetatarsal amputation of foot, left (HCC)     Plan: Begin daily Dial soap cleansing of the incision. Apply dry dressing. Have advised nursing to do Silvadene dressing changes to the dorsal and medial foot ulcerations. Elevate the foot as tolerated. Have discussed that he may weight-bear through the heel for transfers.  Follow-Up Instructions: Return in about 2 weeks (around 10/28/2016).   Ortho Exam  Patient is alert, oriented, no adenopathy, well-dressed, normal affect, normal respiratory effort. The left transmetatarsal amputation is him incision is well approximated with sutures. Does have moderate swelling to the foot. Has a dorsal ulceration that is 2 cm x 5 mm this is filled in with exudative tissue. Also has a small medial ulcer this is 3 mm in diameter and 1 mm deep filled in with exudative tissue there is no surrounding erythema no odor no drainage no sign of infection.  Imaging: No results found.  Labs: Lab Results  Component Value Date   HGBA1C 5.4 07/23/2016   HGBA1C 5.3 02/09/2016   HGBA1C 4.4 12/30/2015   ESRSEDRATE 25 (H) 07/22/2016   ESRSEDRATE 11 04/12/2016   ESRSEDRATE 61 (H) 08/13/2015   CRP 1.7 (H) 07/22/2016   CRP 9.6 (H) 08/13/2015   CRP 19.5 (H) 08/10/2015   LABURIC 6.6 07/23/2016   LABURIC 7.3 08/13/2015   LABURIC 9.4 (H) 08/09/2015   REPTSTATUS 07/25/2016 FINAL 07/24/2016   GRAMSTAIN  08/13/2015    RARE WBC PRESENT, PREDOMINANTLY PMN NO  ORGANISMS SEEN [facility]formed at [facility]    GRAMSTAIN  08/13/2015    RARE WBC PRESENT, PREDOMINANTLY PMN NO ORGANISMS SEEN Performed at [facility]    CULT NO GROWTH 07/24/2016   LABORGA STAPHYLOCOCCUS AUREUS 08/13/2015    Orders:  No orders of the defined types were placed in this encounter.  No orders of the defined types were placed in this encounter.    Procedures: No procedures performed  Clinical Data: No additional findings.  ROS:  All other systems negative, except as noted in the HPI. Review of Systems  Constitutional: Negative for chills and fever.  Cardiovascular: Positive for leg swelling.    Objective: Vital Signs: There were no vitals taken for this visit.  Specialty Comments:  No specialty comments available.  PMFS History: Patient Active Problem List   Diagnosis Date Noted  . S/P transmetatarsal amputation of foot, left (HCC) 10/08/2016  . PAD (peripheral artery disease) (HCC) 09/15/2016  . Osteomyelitis (HCC) 07/22/2016  . Skin ulcer of second toe of left foot (HCC) 07/01/2016  . Edema 05/05/2016  . Phantom pain 03/08/2016  . Gout 03/08/2016  . Hypothyroidism (acquired) 03/08/2016  . CKD (chronic kidney disease) stage 3, GFR 30-59 ml/min 03/08/2016  . GERD without esophagitis 03/08/2016  . Constipation 12/29/2015  . Below knee amputation status (HCC) 10/01/2015  .  Ankle pain 08/13/2015  . Adult failure to thrive   . PVD (peripheral vascular disease) (HCC)   . Elevated blood uric acid level 08/10/2015  . 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 Anemia of chronic disease 08/10/2015  . Malnutrition of moderate degree 06/24/2015  . Failure to thrive in adult 06/23/2015  . Weight loss 06/23/2015  . Tobacco abuse 06/23/2015  . COPD (chronic obstructive pulmonary disease) (HCC) 06/23/2015  . Fall   . Critical lower limb ischemia 05/31/2014  . Status post right foot surgery 03/12/2014   Past Medical History:  Diagnosis Date    . Adult failure to thrive    /notes 06/23/2015  . Anemia   . Arthritis    "hands" (06/23/2015)  . COPD (chronic obstructive pulmonary disease) (HCC)    LONG TIME SMOKER  . Critical lower limb ischemia   . 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 Depression    "periods of depression" (06/23/2015)  . Gout   . Hypothyroidism   . Physical deconditioning   . Pyelonephritis 06/23/2015  . Status post foot surgery    right fifth toe amputation by Dr. Lajoyce Corners     Family History  Problem Relation Age of Onset  . Heart failure Father   . Heart attack Father   . Heart failure Brother     Past Surgical History:  Procedure Laterality Date  . ABDOMINAL AORTOGRAM W/LOWER EXTREMITY N/A 09/03/2016   Procedure: Abdominal Aortogram w/Lower Extremity;  Surgeon: Sherren Kerns, MD;  Location[facility] INVASIVE CV LAB;  Service: Cardiovascular;  Laterality: N/A;  Lt. leg  . AMPUTATION Right 03/01/2014   Procedure: AMPUTATION RAY;  Surgeon: Nadara Mustard, MD;  Location: [facility] OR;  Service: Orthopedics;  Laterality: Right;  Right Foot 5th Ray Amputation  . AMPUTATION Right 04/26/2014   Procedure: Right Foot 4th Ray Amputation;  Surgeon: Nadara Mustard, MD;  Location: [facility] OR;  Service: Orthopedics;  Laterality: Right;  . AMPUTATION Right 10/01/2015   Procedure: Right Below Knee Amputation;  Surgeon: Nadara Mustard, MD;  Location: [facility] OR;  Service: Orthopedics;  Laterality: Right;  . AMPUTATION Left 07/23/2016   Procedure: Left 2nd Toe Amputation;  Surgeon: Nadara Mustard, MD;  Location: [facility] OR;  Service: Orthopedics;  Laterality: Left;  . AMPUTATION Left 10/08/2016   Procedure: Left Transmetatarsal Amputation;  Surgeon: Nadara Mustard, MD;  Location[facility] OR;  Service: Orthopedics;  Laterality: Left;  . ANKLE ARTHROSCOPY Right 08/13/2015   Procedure: ANKLE ARTHROSCOPY;  Surgeon: Nadara Mustard, MD;  [facility] OR;  Service: Orthopedics;  Laterality: Right;  . CATARACT EXTRACTION W/ INTRAOCULAR LENS   IMPLANT, BILATERAL Bilateral   . INGUINAL HERNIA REPAIR Left   . KNEE CARTILAGE SURGERY Left 1960's   football injury  . KNEE LIGAMENT RECONSTRUCTION Left 1960's  . LOWER EXTREMITY ANGIOGRAM N/A 05/27/2014   Procedure: LOWER EXTREMITY ANGIOGRAM;  Surgeon: Runell Gess, MD;  Location: [facility] CATH LAB;  Service: Cardiovascular;  Laterality: N/A;  . LOWER EXTREMITY ANGIOGRAPHY Left 09/15/2016   Procedure: Lower Extremity Angiography;  Surgeon: Maeola Harman, MD;  Location[facility] INVASIVE CV LAB;  Service: Cardiovascular;  Laterality: Left;  . PERIPHERAL VASCULAR INTERVENTION Left 09/15/2016   Procedure: Peripheral Vascular Intervention;  Surgeon: Maeola Harman, MD;  Location: [facility] INVASIVE CV LAB;  Service: Cardiovascular;  Laterality: Left;  SFA/popiteal  . STUMP REVISION Right 12/05/2015   Procedure: Revision Right Below Knee Amputation;  Surgeon: Berna Spare  Kandis Mannan, MD;  Location: [facility] OR;  Service: Orthopedics;  Laterality: Right;   Social History   Occupational History  . Not on file.   Social History Main Topics  . Smoking status: Former Smoker    Packs/day: 0.25    Years: 57.00    Types: Cigarettes  . Smokeless tobacco: Never Used  . Alcohol use Yes     Comment: 06/23/2015 "was drinking alot of beer; quit in 2015" 09/30/15 - no longer drinks  . Drug use: No  . Sexual activity: No

## 2016-10-14 NOTE — Telephone Encounter (Signed)
I called and sw the nurse and advise per Denny Peonrin ok for stop date of Doxy today.

## 2016-10-14 NOTE — Progress Notes (Signed)
LOCATION: [facility]  PCP: Oneal Grout, MD   Code Status: Full Code  Goals of care: Advanced Directive information Advanced Directives 10/08/2016  Does Patient Have a Medical Advance Directive? No  Type of Advance Directive -  Does patient want to make changes to medical advance directive? -  Copy of Healthcare Power of Attorney in Chart? -  Would patient like information on creating a medical advance directive? No - Patient declined       Extended Emergency Contact Information Primary Emergency Contact: [name],[name] Address: [address]          [place], [place] [address] [place] of [place] Home Phone: [phone] Mobile Phone: [phone] Relation: Niece Secondary Emergency Contact: [name] Home Phone: [phone] Mobile Phone: [phone] Relation: Friend   No Known Allergies  Chief Complaint  Patient presents with  . Readmit To SNF    Readmission Visit      HPI:  Patient is a 76 y.o. male seen today for Long-term care post readmission to the hospital from 10/08/2016-10/12/2016 with gangrene of left foot. He underwent left transmetatarsal amputation. Of note, he was in the hospital recently for ultrasound-guided cannulation of right common femoral artery and angiography of left lower extremity placement of stent on left anterior tibia artery and left SFA- popliteal artery. He has medical history of severe peripheral arterial disease and is status post right below-knee amputation as well. He is seen in his room today.   Review of Systems:  Constitutional: Negative for fever, chills, diaphoresis.  HENT: Negative for headache, congestion, nasal discharge, difficulty swallowing.   Eyes: Negative for blurred vision, double vision and discharge.  Respiratory: Negative for cough, shortness of breath and wheezing.   Cardiovascular: Negative for chest pain, palpitations Gastrointestinal: Negative for heartburn, nausea,  abdominal pain, loss of appetite, melena, diarrhea and constipation. Positive for vomiting post meals. He mentions this has been on and off for few weeks. He has not notified about this to nursing staff.  Genitourinary: Negative for dysuria and flank pain.  Musculoskeletal: Negative for back pain, fall.  pain is under control.  Skin: Negative for itching, rash.  Neurological: Negative for dizziness. Psychiatric/Behavioral: positive for depression   Past Medical History:  Diagnosis Date  . Adult failure to thrive    /notes 06/23/2015  . Anemia   . Arthritis    "hands" (06/23/2015)  . COPD (chronic obstructive pulmonary disease) (HCC)    LONG TIME SMOKER  . Critical lower limb ischemia   . 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 Depression    "periods of depression" (06/23/2015)  . Gout   . Hypothyroidism   . Physical deconditioning   . Pyelonephritis 06/23/2015  . Status post foot surgery    right fifth toe amputation by Dr. Lajoyce Corners    Past Surgical History:  Procedure Laterality Date  . ABDOMINAL AORTOGRAM W/LOWER EXTREMITY N/A 09/03/2016   Procedure: Abdominal Aortogram w/Lower Extremity;  Surgeon: Sherren Kerns, MD;  Location: [facility] INVASIVE CV LAB;  Service: Cardiovascular;  Laterality: N/A;  Lt. leg  . AMPUTATION Right 03/01/2014   Procedure: AMPUTATION RAY;  Surgeon: Nadara Mustard, MD;  Location: [facility] OR;  Service: Orthopedics;  Laterality: Right;  Right Foot 5th Ray Amputation  . AMPUTATION Right 04/26/2014   Procedure: Right Foot 4th Ray Amputation;  Surgeon: Nadara Mustard, MD;  Location: [facility] OR;  Service: Orthopedics;  Laterality: Right;  . AMPUTATION Right  10/01/2015   Procedure: Right Below Knee Amputation;  Surgeon: Nadara Mustard, MD;  Location: [facility] OR;  Service: Orthopedics;  Laterality: Right;  . AMPUTATION Left 07/23/2016   Procedure: Left 2nd Toe Amputation;  Surgeon: Nadara Mustard, MD;  Location: [facility] OR;  Service: Orthopedics;  Laterality: Left;    . AMPUTATION Left 10/08/2016   Procedure: Left Transmetatarsal Amputation;  Surgeon: Nadara Mustard, MD;  Location: [facility] OR;  Service: Orthopedics;  Laterality: Left;  . ANKLE ARTHROSCOPY Right 08/13/2015   Procedure: ANKLE ARTHROSCOPY;  Surgeon: Nadara Mustard, MD;  Location: [facility] OR;  Service: Orthopedics;  Laterality: Right;  . CATARACT EXTRACTION W/ INTRAOCULAR LENS  IMPLANT, BILATERAL Bilateral   . INGUINAL HERNIA REPAIR Left   . KNEE CARTILAGE SURGERY Left 1960's   football injury  . KNEE LIGAMENT RECONSTRUCTION Left 1960's  . LOWER EXTREMITY ANGIOGRAM N/A 05/27/2014   Procedure: LOWER EXTREMITY ANGIOGRAM;  Surgeon: Runell Gess, MD;  Location: [facility] CATH LAB;  Service: Cardiovascular;  Laterality: N/A;  . LOWER EXTREMITY ANGIOGRAPHY Left 09/15/2016   Procedure: Lower Extremity Angiography;  Surgeon: Maeola Harman, MD;  Location: [facility] INVASIVE CV LAB;  Service: Cardiovascular;  Laterality: Left;  . PERIPHERAL VASCULAR INTERVENTION Left 09/15/2016   Procedure: Peripheral Vascular Intervention;  Surgeon: Maeola Harman, MD;  Location: [facility] INVASIVE CV LAB;  Service: Cardiovascular;  Laterality: Left;  SFA/popiteal  . STUMP REVISION Right 12/05/2015   Procedure: Revision Right Below Knee Amputation;  Surgeon: Nadara Mustard, MD;  Location: [facility] OR;  Service: Orthopedics;  Laterality: Right;   Social History:   reports that he has quit smoking. His smoking use included Cigarettes. He has a 14.25 pack-year smoking history. He has never used smokeless tobacco. He reports that he drinks alcohol. He reports that he does not use drugs.  Family History  Problem Relation Age of Onset  . Heart failure Father   . Heart attack Father   . Heart failure Brother     Medications: Allergies as of 10/14/2016   No Known Allergies     Medication List       Accurate as of 10/14/16 11:31 AM. Always use your most recent med list.          albuterol 108 (90 Base) MCG/ACT inhaler Commonly  known as:  PROVENTIL HFA;VENTOLIN HFA Inhale 2 puffs into the lungs 2 (two) times daily. 0900 & 1700   allopurinol 100 MG tablet Commonly known as:  ZYLOPRIM Take 100 mg by mouth daily. 0900   cholecalciferol 1000 units tablet Commonly known as:  VITAMIN D Take 1,000 Units by mouth daily. (0900)   clopidogrel 75 MG tablet Commonly known as:  PLAVIX Take 1 tablet (75 mg total) by mouth daily with breakfast.   docusate sodium 100 MG capsule Commonly known as:  COLACE Take 100 mg by mouth 2 (two) times daily. 0900 & 1700   doxycycline 100 MG tablet Commonly known as:  VIBRA-TABS Take 100 mg by mouth 2 (two) times daily. Starting 09/28/16 then to resume previous order 1 by mouth daily for for left foot infection   famotidine 20 MG tablet Commonly known as:  PEPCID Take 20 mg by mouth daily. 0900   gabapentin 100 MG capsule Commonly known as:  NEURONTIN Take 200 mg by mouth at bedtime. (2100)   levothyroxine 25 MCG tablet Commonly known as:  SYNTHROID, LEVOTHROID Take 12.5 mcg by mouth daily at 6 (six) AM.   loperamide 2 MG tablet Commonly known  as:  IMODIUM A-D Take 2-4 mg by mouth every 4 (four) hours as needed for diarrhea or loose stools (Do not give if <3 stools or DX. w/c. diff or norovirus. Do not exceed 16 mg/24hrs.).   meloxicam 7.5 MG tablet Commonly known as:  MOBIC Take 7.5 mg by mouth daily. 0900   methocarbamol 500 MG tablet Commonly known as:  ROBAXIN Take 500 mg by mouth every 6 (six) hours as needed for muscle spasms.   multivitamin with minerals tablet Take 1 tablet by mouth daily. (0900)   nitroGLYCERIN 0.2 mg/hr patch Commonly known as:  NITRODUR - Dosed in mg/24 hr Place 1 patch (0.2 mg total) onto the skin daily. Rotate site along dorsum of foot daily.   omeprazole 20 MG capsule Commonly known as:  PRILOSEC Take 20 mg by mouth daily at 6 (six) AM.   oxyCODONE 5 MG immediate release tablet Commonly known as:  Oxy IR/ROXICODONE Take 5-10 mg by  mouth every 4 (four) hours as needed for moderate pain or severe pain.   polyethylene glycol packet Commonly known as:  MIRALAX / GLYCOLAX Take 17 g by mouth daily. (0900) Hold for loose stool   saccharomyces boulardii 250 MG capsule Commonly known as:  FLORASTOR Take 250 mg by mouth 2 (two) times daily.   senna 8.6 MG Tabs tablet Commonly known as:  SENOKOT Take 1 tablet (8.6 mg total) by mouth 2 (two) times daily.   traZODone 50 MG tablet Commonly known as:  DESYREL Take 50 mg by mouth at bedtime. (2100)   UNABLE TO FIND Med Name: Medpass 90 ml twice daily for weight loss       Immunizations: Immunization History  Administered Date(s) Administered  . Influenza,inj,Quad PF,36+ Mos 06/25/2015  . PPD Test 10/03/2015, 10/17/2015  . Pneumococcal Conjugate-13 10/03/2015  . Pneumococcal Polysaccharide-23 06/25/2015     Physical Exam: Vitals:   10/14/16 1126  BP: 130/71  Pulse: 85  Resp: 18  Temp: 98 F (36.7 C)  TempSrc: Oral  SpO2: 94%  Weight: 169 lb (76.7 kg)  Height: 6\' 4"  (1.93 m)   Body mass index is 20.57 kg/m.  General- elderly male, thin built and frail, in no acute distress Head- normocephalic, atraumatic Nose- no maxillary or frontal sinus tenderness, no nasal discharge Throat- moist mucus membrane, edentulous Eyes- PERRLA, EOMI, no pallor, no icterus, no discharge, normal conjunctiva, normal sclera Neck- no cervical lymphadenopathy Cardiovascular- normal s1,s2, no murmur Respiratory- bilateral clear to auscultation, no wheeze, no rhonchi, no crackles, no use of accessory muscles Abdomen- bowel sounds present, soft, non tender, no guarding or rigidity, no CVA tenderness Musculoskeletal- able to move all 4 extremities, right below-knee amputation, left transmetatarsal amputation with wound vac in place Neurological- alert and oriented to person, place and time Skin- warm and dry Psychiatry- flat affect    Labs reviewed: Basic Metabolic  Panel:  Recent Labs  07/27/16 0854  09/15/16 1017  09/16/16 0206 09/27/16 10/08/16 1211  NA 135  < > 140  --  139 141 141  K 4.7  < > 4.3  --  4.7 4.9 4.3  CL 104  < > 106  --  109  --  111  CO2 24  --   --   --  24  --  21*  GLUCOSE 105*  < > 83  --  93  --  85  BUN 23*  < > 21*  --  19 24* 16  CREATININE 1.57*  < >  1.10  < > 1.22 1.4* 1.27*  CALCIUM 9.4  --   --   --  8.6*  --  8.9  < > = values in this interval not displayed. Liver Function Tests:  Recent Labs  04/12/16  AST 9*  ALT 5*  ALKPHOS 70   No results for input(s): LIPASE, AMYLASE in the last 8760 hours. No results for input(s): AMMONIA in the last 8760 hours. CBC:  Recent Labs  04/12/16 07/22/16 1626  09/15/16 1752 09/16/16 0206 09/27/16 10/08/16 1211  WBC 8.0 17.2*  < > 11.7* 9.5 16.2 17.3*  NEUTROABS 3 10.3*  --   --   --   --   --   HGB 10.3* 10.6*  < > 10.7* 10.0* 9.6* 11.4*  HCT 34* 33.8*  < > 35.0* 32.4* 31* 36.2*  MCV  --  72.7*  < > 76.6* 75.3*  --  76.7*  PLT 321 293  < > 331 325 543* 389  < > = values in this interval not displayed. Cardiac Enzymes: No results for input(s): CKTOTAL, CKMB, CKMBINDEX, TROPONINI in the last 8760 hours. BNP: Invalid input(s): POCBNP CBG:  Recent Labs  12/05/15 1414 10/11/16 1102  GLUCAP 84 80     Assessment/Plan  Unsteady gait Status post left transmetatarsal amputation. Will need for him to work with physical therapy and occupational therapy to help with safety transfers and strengthening of muscles. Fall precautions to be taken.  Left foot osteomyelitis s/p left transmetatarsal amputation. Has wound vac in place. Currently on doxycycline 100 mg bid with no stop date. Will need further clarification from orthopedic office. Patient is afebrile. Continue florastor.   Vomiting Patient complaints of vomiting on and off for few weeks. It was not reported to staff and thus no one was aware. Patient appears hydrated. Denies any reflux or epigastric/  abdominal pain. Denies any nausea. Will place him on clear liquid diet for today and advance as tolerated. If no improvement will need to assess for gut motility and GOO. benign abdominal exam. No diarrhea or constipation reported. Currently on omeprazole 20 mg daily. Change this to 20 mg twice a day for now. Continue famotidine. Check bmp and LFT . To provide phenergan on need basis for nausea or vomiting.   Severe peripheral artery disease Has undergone right below-knee amputation and now left transmetatarsal amputation. Pain followed by Dr. Lajoyce Corners. Continue oxycodone immediate release 5 mg 1-2 tablet every 4 hours as needed for pain and Robaxin 500 mg every 6 hours as needed for muscle spasm. Will have PMR consult. Continue nitroglycerin patch and Plavix. Wound VAC in place to the left leg wound. Monitor for signs of infection.  Anemia of chronic disease Monitor CBC  Depression Likely situational with his recent amputation and hospitalization, psychiatry consult. He refuses to initiate medication at present for depression. Denies suicidal and homicidal ideation  Acquired hypothyroidism Continue levothyroxine 12.5 mg daily Lab Results  Component Value Date   TSH 4.83 09/09/2016    COPD Breathing has been stable. Continue his bronchodilators. No changes made.  Opioid induced Constipation Has been having regular bowel movement. Continue senna twice a day and daily MiraLAX.  phantom pain Continue gabapentin, no changes made  ckd stage 3 Monitor bmp  Goals of care: long term care   Labs/tests ordered: CBC, CMP, magnesium  Family/ staff Communication: reviewed care plan with patient and nursing supervisor  I have spent greater than 50 minutes for this encounter which includes reviewing hospital records,  addressing above mentioned concerns, reviewing care plan with patient, answering patient's concerns and counseling     Oneal GroutMAHIMA Charlee Whitebread, MD Internal Medicine [facility] [address] [place], [place] [address] Cell Phone (Monday-Friday 8 am - 5 pm): [phone] On Call: [phone] and follow prompts after 5 pm and on weekends Office Phone: [phone] Office Fax: [phone]

## 2016-10-14 NOTE — Telephone Encounter (Signed)
Missy calling for clarification on an antibiotic order.

## 2016-10-15 LAB — HEPATIC FUNCTION PANEL
ALT: 4 U/L — AB (ref 10–40)
AST: 13 U/L — AB (ref 14–40)
Alkaline Phosphatase: 70 U/L (ref 25–125)
Bilirubin, Total: 0.5 mg/dL

## 2016-10-15 LAB — CBC AND DIFFERENTIAL
HCT: 32 % — AB (ref 41–53)
Hemoglobin: 9.8 g/dL — AB (ref 13.5–17.5)
PLATELETS: 304 10*3/uL (ref 150–399)
WBC: 11.6 10^3/mL

## 2016-10-15 LAB — BASIC METABOLIC PANEL
BUN: 15 mg/dL (ref 4–21)
Creatinine: 1.1 mg/dL (ref 0.6–1.3)
Glucose: 93 mg/dL
POTASSIUM: 4.3 mmol/L (ref 3.4–5.3)
SODIUM: 141 mmol/L (ref 137–147)

## 2016-10-28 ENCOUNTER — Ambulatory Visit (INDEPENDENT_AMBULATORY_CARE_PROVIDER_SITE_OTHER): Payer: Medicare HMO | Admitting: Orthopedic Surgery

## 2016-10-29 ENCOUNTER — Encounter: Payer: Self-pay | Admitting: Vascular Surgery

## 2016-10-29 ENCOUNTER — Ambulatory Visit (INDEPENDENT_AMBULATORY_CARE_PROVIDER_SITE_OTHER): Payer: Self-pay | Admitting: Vascular Surgery

## 2016-10-29 ENCOUNTER — Ambulatory Visit (HOSPITAL_COMMUNITY)
Admission: RE | Admit: 2016-10-29 | Discharge: 2016-10-29 | Disposition: A | Discharge status: Home / Self Care | Payer: Medicare HMO | Source: Ambulatory Visit | Attending: Vascular Surgery | Admitting: Vascular Surgery

## 2016-10-29 ENCOUNTER — Other Ambulatory Visit: Payer: Self-pay

## 2016-10-29 VITALS — BP 136/64 | HR 64 | Temp 97.3°F | Resp 18 | Ht 76.0 in | Wt 174.0 lb

## 2016-10-29 DIAGNOSIS — I739 Peripheral vascular disease, unspecified: Secondary | ICD-10-CM

## 2016-10-29 NOTE — Progress Notes (Signed)
Patient ID: Caleb Hays, male   DOB: 04-07-1941, 76 y.o.   MRN: [id number]  Reason for Consult: Re-evaluation   Referred by Oneal Grout, MD  Subjective:     HPI:  Caleb Hays is a 76 y.o. male follows up from recent left femoral to anterior tibial artery stenting via retrograde approach. Subsequently had a left foot transmetatarsal amputation by Dr. Lajoyce Corners which is healing satisfactorily. He does complain of 6 out of 10 pain on the left. He underwent duplex today demonstrates that his stents are occluded from the superficial femoral all the way to the anterior tibial artery. He is wondering about repeat procedure to establish blood flow. He is unsure if he is taking aspirin or Plavix at this time.  Past Medical History:  Diagnosis Date  . Adult failure to thrive    /notes 06/23/2015  . Anemia   . Arthritis    "hands" (06/23/2015)  . COPD (chronic obstructive pulmonary disease) (HCC)    LONG TIME SMOKER  . Critical lower limb ischemia   . 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 Depression    "periods of depression" (06/23/2015)  . Gout   . Hypothyroidism   . Physical deconditioning   . Pyelonephritis 06/23/2015  . Status post foot surgery    right fifth toe amputation by Dr. Lajoyce Corners    Family History  Problem Relation Age of Onset  . Heart failure Father   . Heart attack Father   . Heart failure Brother    Past Surgical History:  Procedure Laterality Date  . ABDOMINAL AORTOGRAM W/LOWER EXTREMITY N/A 09/03/2016   Procedure: Abdominal Aortogram w/Lower Extremity;  Surgeon: Sherren Kerns, MD;  Location: [facility] INVASIVE CV LAB;  Service: Cardiovascular;  Laterality: N/A;  Lt. leg  . AMPUTATION Right 03/01/2014   Procedure: AMPUTATION RAY;  Surgeon: Nadara Mustard, MD;  Location: [facility] OR;  Service: Orthopedics;  Laterality: Right;  Right Foot 5th Ray Amputation  . AMPUTATION Right 04/26/2014   Procedure: Right Foot 4th Ray Amputation;  Surgeon:  Nadara Mustard, MD;  Location: [facility] OR;  Service: Orthopedics;  Laterality: Right;  . AMPUTATION Right 10/01/2015   Procedure: Right Below Knee Amputation;  Surgeon: Nadara Mustard, MD;  Location: [facility] OR;  Service: Orthopedics;  Laterality: Right;  . AMPUTATION Left 07/23/2016   Procedure: Left 2nd Toe Amputation;  Surgeon: Nadara Mustard, MD;  Location: [facility] OR;  Service: Orthopedics;  Laterality: Left;  . AMPUTATION Left 10/08/2016   Procedure: Left Transmetatarsal Amputation;  Surgeon: Nadara Mustard, MD;  Location: [facility] OR;  Service: Orthopedics;  Laterality: Left;  . ANKLE ARTHROSCOPY Right 08/13/2015   Procedure: ANKLE ARTHROSCOPY;  Surgeon: Nadara Mustard, MD;  Location: [facility] OR;  Service: Orthopedics;  Laterality: Right;  . CATARACT EXTRACTION W/ INTRAOCULAR LENS  IMPLANT, BILATERAL Bilateral   . INGUINAL HERNIA REPAIR Left   . KNEE CARTILAGE SURGERY Left 1960's   football injury  . KNEE LIGAMENT RECONSTRUCTION Left 1960's  . LOWER EXTREMITY ANGIOGRAM N/A 05/27/2014   Procedure: LOWER EXTREMITY ANGIOGRAM;  Surgeon: Runell Gess, MD;  Location: [facility] CATH LAB;  Service: Cardiovascular;  Laterality: N/A;  . LOWER EXTREMITY ANGIOGRAPHY Left 09/15/2016   Procedure: Lower Extremity Angiography;  Surgeon: Maeola Harman, MD;  Location: [facility] INVASIVE CV LAB;  Service: Cardiovascular;  Laterality: Left;  . PERIPHERAL VASCULAR INTERVENTION Left 09/15/2016   Procedure: Peripheral Vascular Intervention;  Surgeon: Maeola Harman,  MD;  Location: [facility] INVASIVE CV LAB;  Service: Cardiovascular;  Laterality: Left;  SFA/popiteal  . STUMP REVISION Right 12/05/2015   Procedure: Revision Right Below Knee Amputation;  Surgeon: Nadara Mustard, MD;  Location: [facility] OR;  Service: Orthopedics;  Laterality: Right;    Short Social History:  Social History  Substance Use Topics  . Smoking status: Former Smoker    Packs/day: 0.25    Years: 57.00    Types: Cigarettes  . Smokeless tobacco: Never Used  . Alcohol use  Yes     Comment: 06/23/2015 "was drinking alot of beer; quit in 2015" 09/30/15 - no longer drinks    No Known Allergies  Current Outpatient Prescriptions  Medication Sig Dispense Refill  . albuterol (PROVENTIL HFA;VENTOLIN HFA) 108 (90 BASE) MCG/ACT inhaler Inhale 2 puffs into the lungs 2 (two) times daily. 0900 & 1700    . allopurinol (ZYLOPRIM) 100 MG tablet Take 100 mg by mouth daily. 0900    . cholecalciferol (VITAMIN D) 1000 units tablet Take 1,000 Units by mouth daily. (0900)    . clopidogrel (PLAVIX) 75 MG tablet Take 1 tablet (75 mg total) by mouth daily with breakfast. 30 tablet 6  . docusate sodium (COLACE) 100 MG capsule Take 100 mg by mouth 2 (two) times daily. 0900 & 1700    . famotidine (PEPCID) 20 MG tablet Take 20 mg by mouth daily. 0900    . gabapentin (NEURONTIN) 100 MG capsule Take 200 mg by mouth at bedtime. (2100)    . levothyroxine (SYNTHROID, LEVOTHROID) 25 MCG tablet Take 12.5 mcg by mouth daily at 6 (six) AM.     . loperamide (IMODIUM A-D) 2 MG tablet Take 2-4 mg by mouth every 4 (four) hours as needed for diarrhea or loose stools (Do not give if <3 stools or DX. w/c. diff or norovirus. Do not exceed 16 mg/24hrs.).     Marland Kitchen meloxicam (MOBIC) 7.5 MG tablet Take 7.5 mg by mouth daily. 0900    . methocarbamol (ROBAXIN) 500 MG tablet Take 500 mg by mouth every 6 (six) hours as needed for muscle spasms.    . Multiple Vitamins-Minerals (MULTIVITAMIN WITH MINERALS) tablet Take 1 tablet by mouth daily. (0900)    . nitroGLYCERIN (NITRODUR - DOSED IN MG/24 HR) 0.2 mg/hr patch Place 1 patch (0.2 mg total) onto the skin daily. Rotate site along dorsum of foot daily. 30 patch 2  . omeprazole (PRILOSEC) 20 MG capsule Take 20 mg by mouth daily at 6 (six) AM.     . oxyCODONE (OXY IR/ROXICODONE) 5 MG immediate release tablet Take 5-10 mg by mouth every 4 (four) hours as needed for moderate pain or severe pain.     . polyethylene glycol (MIRALAX / GLYCOLAX) packet Take 17 g by mouth daily.  (0900) Hold for loose stool    . saccharomyces boulardii (FLORASTOR) 250 MG capsule Take 250 mg by mouth 2 (two) times daily.    Marland Kitchen senna (SENOKOT) 8.6 MG TABS tablet Take 1 tablet (8.6 mg total) by mouth 2 (two) times daily. 30 each 0  . traZODone (DESYREL) 50 MG tablet Take 50 mg by mouth at bedtime. (2100)    . UNABLE TO FIND Med Name: Medpass 90 ml twice daily for weight loss    . doxycycline (VIBRA-TABS) 100 MG tablet Take 100 mg by mouth 2 (two) times daily. Starting 09/28/16 then to resume previous order 1 by mouth daily for for left foot infection     No current facility-administered  medications for this visit.     Review of Systems  Musculoskeletal: Positive for leg pain.        Objective:  Objective   Vitals:   10/29/16 1058  BP: 136/64  Pulse: 64  Resp: 18  Temp: 97.3 F (36.3 C)  SpO2: 99%  Weight: 174 lb (78.9 kg)  Height:  (1.93 m)   Body mass index is 21.18 kg/m.  Physical Exam  Constitutional: He is oriented to person, place, and time. He appears well-developed.  HENT:  Head: Normocephalic.  Neck: Normal range of motion.  Cardiovascular:  Pulses:      Femoral pulses are 2+ on the right side, and 2+ on the left side. Monophasic left pt is greater than left peroneal signals  Abdominal: Soft.  Musculoskeletal: Normal range of motion. He exhibits no edema.  Neurological: He is alert and oriented to person, place, and time.  Skin: Skin is warm and dry.    Data: I've independently interpreted his left lower extremity arterial duplex today which demonstrates occluded stents in his superficial femoral and popliteal arteries. He does have flow in his posterior tibial artery on the left undescended his anterior tibial artery is also occluded.     Assessment/Plan:     76 year old male status post stenting of much of his left lower extremity subsequent transverse metatarsal amputation by Dr. due to. His TMA is healing satisfactorily although there are some  areas of concern. Given that his stents are occluded we will perform repeat angiogram to evaluate his blood flow for possible further endovascular intervention versus bypass if needed. He demonstrates good understanding of this we will get him scheduled today.     Maeola Harman MD [facility]

## 2016-11-02 ENCOUNTER — Ambulatory Visit (INDEPENDENT_AMBULATORY_CARE_PROVIDER_SITE_OTHER): Payer: Medicare HMO | Admitting: Orthopedic Surgery

## 2016-11-02 ENCOUNTER — Encounter (INDEPENDENT_AMBULATORY_CARE_PROVIDER_SITE_OTHER): Payer: Self-pay | Admitting: Orthopedic Surgery

## 2016-11-02 VITALS — Ht 76.0 in | Wt 174.0 lb

## 2016-11-02 DIAGNOSIS — Z89432 Acquired absence of left foot: Secondary | ICD-10-CM

## 2016-11-02 NOTE — Progress Notes (Signed)
Office Visit Note   Patient: Caleb Hays           Date of Birth: [birth year]           MRN: [id number] Visit Date: 11/02/2016              Requested by: Oneal Grout, MD [address] [place], [place] [address] PCP: Oneal Grout, MD  Chief Complaint  Patient presents with  . Left Foot - Follow-up    Lt Transmetatarsal Amputation 10/08/16 25 days post op      HPI: Patient is almost 4 weeks status post transmetatarsal amputation the left he has venous stasis swelling and has peripheral vascular disease he is going to undergo a angioplasty with Dr. Randie Heinz  this week.  Assessment & Plan: Visit Diagnoses:  1. S/P transmetatarsal amputation of foot, left (HCC)     Plan: Sutures harvested today continue with Silvadene dressing changes daily continue with minimize weightbearing continue with elevation.  Follow-Up Instructions: Return in about 3 weeks (around 11/23/2016).   Ortho Exam  Patient is alert, oriented, no adenopathy, well-dressed, normal affect, normal respiratory effort. Examination patient has mild ischemic changes along the surgical incision. There is no cellulitis no odor no drainage no signs of infection.  Imaging: No results found.  Labs: Lab Results  Component Value Date   HGBA1C 5.4 07/23/2016   HGBA1C 5.3 02/09/2016   HGBA1C 4.4 12/30/2015   ESRSEDRATE 25 (H) 07/22/2016   ESRSEDRATE 11 04/12/2016   ESRSEDRATE 61 (H) 08/13/2015   CRP 1.7 (H) 07/22/2016   CRP 9.6 (H) 08/13/2015   CRP 19.5 (H) 08/10/2015   LABURIC 6.6 07/23/2016   LABURIC 7.3 08/13/2015   LABURIC 9.4 (H) 08/09/2015   REPTSTATUS 07/25/2016 FINAL 07/24/2016   GRAMSTAIN  08/13/2015    RARE WBC PRESENT, PREDOMINANTLY PMN NO ORGANISMS SEEN Performed at [facility]    GRAMSTAIN  08/13/2015    RARE WBC PRESENT, PREDOMINANTLY PMN NO ORGANISMS SEEN Performed at [facility]    CULT NO GROWTH 07/24/2016   LABORGA STAPHYLOCOCCUS AUREUS 08/13/2015    Orders:  No  orders of the defined types were placed in this encounter.  No orders of the defined types were placed in this encounter.    Procedures: No procedures performed  Clinical Data: No additional findings.  ROS:  All other systems negative, except as noted in the HPI. Review of Systems  Objective: Vital Signs: Ht  (1.93 m)   Wt 174 lb (78.9 kg)   BMI 21.18 kg/m   Specialty Comments:  No specialty comments available.  PMFS History: Patient Active Problem List   Diagnosis Date Noted  . S/P transmetatarsal amputation of foot, left (HCC) 10/08/2016  . PAD (peripheral artery disease) (HCC) 09/15/2016  . Osteomyelitis (HCC) 07/22/2016  . Skin ulcer of second toe of left foot (HCC) 07/01/2016  . Edema 05/05/2016  . Phantom pain (HCC) 03/08/2016  . Gout 03/08/2016  . Hypothyroidism (acquired) 03/08/2016  . CKD (chronic kidney disease) stage 3, GFR 30-59 ml/min 03/08/2016  . GERD without esophagitis 03/08/2016  . Constipation 12/29/2015  . Below knee amputation status (HCC) 10/01/2015  . Ankle pain 08/13/2015  . Adult failure to thrive   . PVD (peripheral vascular disease) (HCC)   . Elevated blood uric acid level 08/10/2015  . 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 Anemia of chronic disease 08/10/2015  . Malnutrition of moderate degree 06/24/2015  . Failure to thrive in adult 06/23/2015  .  Weight loss 06/23/2015  . Tobacco abuse 06/23/2015  . COPD (chronic obstructive pulmonary disease) (HCC) 06/23/2015  . Fall   . Critical lower limb ischemia 05/31/2014  . Status post right foot surgery 03/12/2014   Past Medical History:  Diagnosis Date  . Adult failure to thrive    /notes 06/23/2015  . Anemia   . Arthritis    "hands" (06/23/2015)  . COPD (chronic obstructive pulmonary disease) (HCC)    LONG TIME SMOKER  . Critical lower limb ischemia   . 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 Depression    "periods of depression" (06/23/2015)    . Gout   . Hypothyroidism   . Physical deconditioning   . Pyelonephritis 06/23/2015  . Status post foot surgery    right fifth toe amputation by Dr. Lajoyce Corners     Family History  Problem Relation Age of Onset  . Heart failure Father   . Heart attack Father   . Heart failure Brother     Past Surgical History:  Procedure Laterality Date  . ABDOMINAL AORTOGRAM W/LOWER EXTREMITY N/A 09/03/2016   Procedure: Abdominal Aortogram w/Lower Extremity;  Surgeon: Sherren Kerns, MD;  Location: [facility] INVASIVE CV LAB;  Service: Cardiovascular;  Laterality: N/A;  Lt. leg  . AMPUTATION Right 03/01/2014   Procedure: AMPUTATION RAY;  Surgeon: Nadara Mustard, MD;  Location: [facility] OR;  Service: Orthopedics;  Laterality: Right;  Right Foot 5th Ray Amputation  . AMPUTATION Right 04/26/2014   Procedure: Right Foot 4th Ray Amputation;  Surgeon: Nadara Mustard, MD;  Location: [facility] OR;  Service: Orthopedics;  Laterality: Right;  . AMPUTATION Right 10/01/2015   Procedure: Right Below Knee Amputation;  Surgeon: Nadara Mustard, MD;  Location: [facility] OR;  Service: Orthopedics;  Laterality: Right;  . AMPUTATION Left 07/23/2016   Procedure: Left 2nd Toe Amputation;  Surgeon: Nadara Mustard, MD;  Location: [facility] OR;  Service: Orthopedics;  Laterality: Left;  . AMPUTATION Left 10/08/2016   Procedure: Left Transmetatarsal Amputation;  Surgeon: Nadara Mustard, MD;  Location: [facility] OR;  Service: Orthopedics;  Laterality: Left;  . ANKLE ARTHROSCOPY Right 08/13/2015   Procedure: ANKLE ARTHROSCOPY;  Surgeon: Nadara Mustard, MD;  Location: [facility] OR;  Service: Orthopedics;  Laterality: Right;  . CATARACT EXTRACTION W/ INTRAOCULAR LENS  IMPLANT, BILATERAL Bilateral   . INGUINAL HERNIA REPAIR Left   . KNEE CARTILAGE SURGERY Left 1960's   football injury  . KNEE LIGAMENT RECONSTRUCTION Left 1960's  . LOWER EXTREMITY ANGIOGRAM N/A 05/27/2014   Procedure: LOWER EXTREMITY ANGIOGRAM;  Surgeon: Runell Gess, MD;  Location: [facility] CATH LAB;  Service: Cardiovascular;   Laterality: N/A;  . LOWER EXTREMITY ANGIOGRAPHY Left 09/15/2016   Procedure: Lower Extremity Angiography;  Surgeon: Maeola Harman, MD;  Location: [facility] INVASIVE CV LAB;  Service: Cardiovascular;  Laterality: Left;  . PERIPHERAL VASCULAR INTERVENTION Left 09/15/2016   Procedure: Peripheral Vascular Intervention;  Surgeon: Maeola Harman, MD;  Location: [facility] INVASIVE CV LAB;  Service: Cardiovascular;  Laterality: Left;  SFA/popiteal  . STUMP REVISION Right 12/05/2015   Procedure: Revision Right Below Knee Amputation;  Surgeon: Nadara Mustard, MD;  Location: [facility] OR;  Service: Orthopedics;  Laterality: Right;   Social History   Occupational History  . Not on file.   Social History Main Topics  . Smoking status: Former Smoker    Packs/day: 0.25    Years: 57.00    Types: Cigarettes  . Smokeless tobacco:  Never Used  . Alcohol use Yes     Comment: 06/23/2015 "was drinking alot of beer; quit in 2015" 09/30/15 - no longer drinks  . Drug use: No  . Sexual activity: No

## 2016-11-04 ENCOUNTER — Encounter (INDEPENDENT_AMBULATORY_CARE_PROVIDER_SITE_OTHER): Payer: Self-pay | Admitting: Orthopedic Surgery

## 2016-11-04 ENCOUNTER — Ambulatory Visit (INDEPENDENT_AMBULATORY_CARE_PROVIDER_SITE_OTHER): Payer: Medicare HMO | Admitting: Family

## 2016-11-04 DIAGNOSIS — Z89432 Acquired absence of left foot: Secondary | ICD-10-CM

## 2016-11-04 NOTE — Progress Notes (Signed)
Office Visit Note   Patient: Caleb Hays           Date of Birth: [birth year]           MRN: [id number] Visit Date: 11/04/2016              Requested by: Oneal Grout, MD [address] [place], [place] [address] PCP: Oneal Grout, MD  Chief Complaint  Patient presents with  . Left Foot - Wound Dehiscence    Left foot transmetatarsal amputation 10/08/16       HPI: The patient is a 76 year old gentleman who presents today status post left transmetatarsal amputation on March 23. The sutures were harvested April 17. He has been having Silvadene dressing changes daily at [facility]. There is dehiscence of the amputation site. He continues to smoke. Has angioplasty scheduled next Thursday. Has been nonweightbearing in a wheelchair.  Assessment & Plan: Visit Diagnoses:  1. S/P transmetatarsal amputation of foot, left (HCC)     Plan: We will follow with him in 2 weeks following his vascular visit and angioplasty. Will reevaluate for surgical intervention at that time. For now continue with the nitroglycerin patches to the dorsum of his foot daily followed by dry dressing changes. Have advised nursing at [facility] place to pack the wound open.  Follow-Up Instructions: Return in about 2 weeks (around 11/18/2016).   Ortho Exam  Patient is alert, oriented, no adenopathy, well-dressed, normal affect, normal respiratory effort. Left foot transmetatarsal amputation is dehisced lengthwise. There is serous drainage. There is no surrounding erythema no odor today. No sign of infection. This does probe 2 cm deep.  Imaging: No results found.  Labs: Lab Results  Component Value Date   HGBA1C 5.4 07/23/2016   HGBA1C 5.3 02/09/2016   HGBA1C 4.4 12/30/2015   ESRSEDRATE 25 (H) 07/22/2016   ESRSEDRATE 11 04/12/2016   ESRSEDRATE 61 (H) 08/13/2015   CRP 1.7 (H) 07/22/2016   CRP 9.6 (H) 08/13/2015   CRP 19.5 (H) 08/10/2015   LABURIC 6.6 07/23/2016   LABURIC 7.3 08/13/2015   LABURIC 9.4  (H) 08/09/2015   REPTSTATUS 07/25/2016 FINAL 07/24/2016   GRAMSTAIN  08/13/2015    RARE WBC PRESENT, PREDOMINANTLY PMN NO ORGANISMS SEEN Performed at [facility]    GRAMSTAIN  08/13/2015    RARE WBC PRESENT, PREDOMINANTLY PMN NO ORGANISMS SEEN Performed at [facility]    CULT NO GROWTH 07/24/2016   LABORGA STAPHYLOCOCCUS AUREUS 08/13/2015    Orders:  No orders of the defined types were placed in this encounter.  No orders of the defined types were placed in this encounter.    Procedures: No procedures performed  Clinical Data: No additional findings.  ROS:  All other systems negative, except as noted in the HPI. Review of Systems  Constitutional: Negative for chills and fever.  Cardiovascular: Positive for leg swelling.  Skin: Positive for wound.    Objective: Vital Signs: There were no vitals taken for this visit.  Specialty Comments:  No specialty comments available.  PMFS History: Patient Active Problem List   Diagnosis Date Noted  . S/P transmetatarsal amputation of foot, left (HCC) 10/08/2016  . PAD (peripheral artery disease) (HCC) 09/15/2016  . Osteomyelitis (HCC) 07/22/2016  . Edema 05/05/2016  . Phantom pain (HCC) 03/08/2016  . Gout 03/08/2016  . Hypothyroidism (acquired) 03/08/2016  . CKD (chronic kidney disease) stage 3, GFR 30-59 ml/min 03/08/2016  . GERD without esophagitis 03/08/2016  . Constipation 12/29/2015  . Below knee  amputation status (HCC) 10/01/2015  . Ankle pain 08/13/2015  . Adult failure to thrive   . PVD (peripheral vascular disease) (HCC)   . Elevated blood uric acid level 08/10/2015  . 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 Anemia of chronic disease 08/10/2015  . Malnutrition of moderate degree 06/24/2015  . Failure to thrive in adult 06/23/2015  . Weight loss 06/23/2015  . Tobacco abuse 06/23/2015  . COPD (chronic obstructive pulmonary disease) (HCC) 06/23/2015  . Fall   . Critical lower limb ischemia  05/31/2014  . Status post right foot surgery 03/12/2014   Past Medical History:  Diagnosis Date  . Adult failure to thrive    /notes 06/23/2015  . Anemia   . Arthritis    "hands" (06/23/2015)  . COPD (chronic obstructive pulmonary disease) (HCC)    LONG TIME SMOKER  . Critical lower limb ischemia   . 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 Depression    "periods of depression" (06/23/2015)  . Gout   . Hypothyroidism   . Physical deconditioning   . Pyelonephritis 06/23/2015  . Status post foot surgery    right fifth toe amputation by Dr. Lajoyce Corners     Family History  Problem Relation Age of Onset  . Heart failure Father   . Heart attack Father   . Heart failure Brother     Past Surgical History:  Procedure Laterality Date  . ABDOMINAL AORTOGRAM W/LOWER EXTREMITY N/A 09/03/2016   Procedure: Abdominal Aortogram w/Lower Extremity;  Surgeon: Sherren Kerns, MD;  Location: [facility] INVASIVE CV LAB;  Service: Cardiovascular;  Laterality: N/A;  Lt. leg  . AMPUTATION Right 03/01/2014   Procedure: AMPUTATION RAY;  Surgeon: Nadara Mustard, MD;  Location: [facility] OR;  Service: Orthopedics;  Laterality: Right;  Right Foot 5th Ray Amputation  . AMPUTATION Right 04/26/2014   Procedure: Right Foot 4th Ray Amputation;  Surgeon: Nadara Mustard, MD;  Location: [facility] OR;  Service: Orthopedics;  Laterality: Right;  . AMPUTATION Right 10/01/2015   Procedure: Right Below Knee Amputation;  Surgeon: Nadara Mustard, MD;  Location: [facility] OR;  Service: Orthopedics;  Laterality: Right;  . AMPUTATION Left 07/23/2016   Procedure: Left 2nd Toe Amputation;  Surgeon: Nadara Mustard, MD;  Location: [facility] OR;  Service: Orthopedics;  Laterality: Left;  . AMPUTATION Left 10/08/2016   Procedure: Left Transmetatarsal Amputation;  Surgeon: Nadara Mustard, MD;  Location: [facility] OR;  Service: Orthopedics;  Laterality: Left;  . ANKLE ARTHROSCOPY Right 08/13/2015   Procedure: ANKLE ARTHROSCOPY;  Surgeon: Nadara Mustard, MD;   Location: [facility] OR;  Service: Orthopedics;  Laterality: Right;  . CATARACT EXTRACTION W/ INTRAOCULAR LENS  IMPLANT, BILATERAL Bilateral   . INGUINAL HERNIA REPAIR Left   . KNEE CARTILAGE SURGERY Left 1960's   football injury  . KNEE LIGAMENT RECONSTRUCTION Left 1960's  . LOWER EXTREMITY ANGIOGRAM N/A 05/27/2014   Procedure: LOWER EXTREMITY ANGIOGRAM;  Surgeon: Runell Gess, MD;  Location: [facility] CATH LAB;  Service: Cardiovascular;  Laterality: N/A;  . LOWER EXTREMITY ANGIOGRAPHY Left 09/15/2016   Procedure: Lower Extremity Angiography;  Surgeon: Maeola Harman, MD;  Location: [facility] INVASIVE CV LAB;  Service: Cardiovascular;  Laterality: Left;  . PERIPHERAL VASCULAR INTERVENTION Left 09/15/2016   Procedure: Peripheral Vascular Intervention;  Surgeon: Maeola Harman, MD;  Location: [facility] INVASIVE CV LAB;  Service: Cardiovascular;  Laterality: Left;  SFA/popiteal  . STUMP REVISION Right 12/05/2015   Procedure: Revision Right Below  Knee Amputation;  Surgeon: Nadara Mustard, MD;  Location: [facility] OR;  Service: Orthopedics;  Laterality: Right;   Social History   Occupational History  . Not on file.   Social History Main Topics  . Smoking status: Current Some Day Smoker    Packs/day: 0.25    Years: 57.00    Types: Cigarettes  . Smokeless tobacco: Never Used  . Alcohol use Yes     Comment: 06/23/2015 "was drinking alot of beer; quit in 2015" 09/30/15 - no longer drinks  . Drug use: No  . Sexual activity: No

## 2016-11-05 ENCOUNTER — Encounter: Payer: Self-pay | Admitting: Vascular Surgery

## 2016-11-10 ENCOUNTER — Encounter (HOSPITAL_COMMUNITY)
Admission: RE | Disposition: A | Discharge status: Home / Self Care | Payer: Self-pay | Source: Ambulatory Visit | Attending: Vascular Surgery

## 2016-11-10 ENCOUNTER — Observation Stay (HOSPITAL_COMMUNITY)
Admission: RE | Admit: 2016-11-10 | Discharge: 2016-11-11 | Disposition: A | Discharge status: Home / Self Care | Payer: Medicare HMO | Source: Ambulatory Visit | Attending: Vascular Surgery | Admitting: Vascular Surgery

## 2016-11-10 ENCOUNTER — Encounter (HOSPITAL_COMMUNITY): Payer: Self-pay | Admitting: General Practice

## 2016-11-10 DIAGNOSIS — M19042 Primary osteoarthritis, left hand: Secondary | ICD-10-CM | POA: Insufficient documentation

## 2016-11-10 DIAGNOSIS — Y831 Surgical operation with implant of artificial internal device as the cause of abnormal reaction of the patient, or of later complication, without mention of misadventure at the time of the procedure: Secondary | ICD-10-CM | POA: Insufficient documentation

## 2016-11-10 DIAGNOSIS — F329 Major depressive disorder, single episode, unspecified: Secondary | ICD-10-CM | POA: Insufficient documentation

## 2016-11-10 DIAGNOSIS — T82856A Stenosis of peripheral vascular stent, initial encounter: Secondary | ICD-10-CM | POA: Diagnosis not present

## 2016-11-10 DIAGNOSIS — I998 Other disorder of circulatory system: Secondary | ICD-10-CM | POA: Diagnosis not present

## 2016-11-10 DIAGNOSIS — Z7902 Long term (current) use of antithrombotics/antiplatelets: Secondary | ICD-10-CM | POA: Insufficient documentation

## 2016-11-10 DIAGNOSIS — E039 Hypothyroidism, unspecified: Secondary | ICD-10-CM | POA: Insufficient documentation

## 2016-11-10 DIAGNOSIS — L97929 Non-pressure chronic ulcer of unspecified part of left lower leg with unspecified severity: Secondary | ICD-10-CM | POA: Insufficient documentation

## 2016-11-10 DIAGNOSIS — I739 Peripheral vascular disease, unspecified: Secondary | ICD-10-CM | POA: Diagnosis present

## 2016-11-10 DIAGNOSIS — M19041 Primary osteoarthritis, right hand: Secondary | ICD-10-CM | POA: Insufficient documentation

## 2016-11-10 DIAGNOSIS — M109 Gout, unspecified: Secondary | ICD-10-CM | POA: Insufficient documentation

## 2016-11-10 DIAGNOSIS — I70249 Atherosclerosis of native arteries of left leg with ulceration of unspecified site: Principal | ICD-10-CM | POA: Insufficient documentation

## 2016-11-10 DIAGNOSIS — Z89511 Acquired absence of right leg below knee: Secondary | ICD-10-CM | POA: Diagnosis not present

## 2016-11-10 DIAGNOSIS — J449 Chronic obstructive pulmonary disease, unspecified: Secondary | ICD-10-CM | POA: Insufficient documentation

## 2016-11-10 DIAGNOSIS — Z7982 Long term (current) use of aspirin: Secondary | ICD-10-CM | POA: Insufficient documentation

## 2016-11-10 HISTORY — PX: ABDOMINAL AORTOGRAM W/LOWER EXTREMITY: CATH118223

## 2016-11-10 HISTORY — PX: PERIPHERAL VASCULAR ATHERECTOMY: CATH118256

## 2016-11-10 HISTORY — DX: Peripheral vascular disease, unspecified: I73.9

## 2016-11-10 HISTORY — PX: PERIPHERAL VASCULAR INTERVENTION: CATH118257

## 2016-11-10 LAB — POCT I-STAT, CHEM 8
BUN: 11 mg/dL (ref 6–20)
CHLORIDE: 106 mmol/L (ref 101–111)
CREATININE: 1.1 mg/dL (ref 0.61–1.24)
Calcium, Ion: 1.19 mmol/L (ref 1.15–1.40)
GLUCOSE: 76 mg/dL (ref 65–99)
HCT: 34 % — ABNORMAL LOW (ref 39.0–52.0)
Hemoglobin: 11.6 g/dL — ABNORMAL LOW (ref 13.0–17.0)
POTASSIUM: 3.8 mmol/L (ref 3.5–5.1)
Sodium: 140 mmol/L (ref 135–145)
TCO2: 27 mmol/L (ref 0–100)

## 2016-11-10 LAB — MRSA PCR SCREENING: MRSA by PCR: NEGATIVE

## 2016-11-10 LAB — POCT ACTIVATED CLOTTING TIME
ACTIVATED CLOTTING TIME: 230 s
ACTIVATED CLOTTING TIME: 213 s
ACTIVATED CLOTTING TIME: 186 s
Activated Clotting Time: 164 seconds

## 2016-11-10 LAB — CBC
HEMATOCRIT: 34.9 % — AB (ref 39.0–52.0)
HEMOGLOBIN: 10.7 g/dL — AB (ref 13.0–17.0)
MCH: 23 pg — AB (ref 26.0–34.0)
MCHC: 30.7 g/dL (ref 30.0–36.0)
MCV: 75.1 fL — AB (ref 78.0–100.0)
Platelets: 246 10*3/uL (ref 150–400)
RBC: 4.65 MIL/uL (ref 4.22–5.81)
RDW: 17.2 % — ABNORMAL HIGH (ref 11.5–15.5)
WBC: 12 10*3/uL — ABNORMAL HIGH (ref 4.0–10.5)

## 2016-11-10 LAB — CREATININE, SERUM
Creatinine, Ser: 0.98 mg/dL (ref 0.61–1.24)
GFR calc Af Amer: >60 mL/min (ref >60)
GFR calc non Af Amer: >60 mL/min (ref >60)

## 2016-11-10 SURGERY — ABDOMINAL AORTOGRAM W/LOWER EXTREMITY
Anesthesia: LOCAL

## 2016-11-10 MED ORDER — HEPARIN (PORCINE) IN NACL 2-0.9 UNIT/ML-% IJ SOLN
INTRAMUSCULAR | Status: DC | PRN
  Administered 2016-11-10: 1000 mL

## 2016-11-10 MED ORDER — ACETAMINOPHEN 325 MG PO TABS
650.0000 mg | ORAL_TABLET | ORAL | Status: DC | PRN
  Administered 2016-11-10: 650 mg via ORAL
  Filled 2016-11-10: qty 2

## 2016-11-10 MED ORDER — ENSURE ENLIVE PO LIQD
237.0000 mL | Freq: Two times a day (BID) | ORAL | Status: DC
  Administered 2016-11-11: 11:00:00 237 mL via ORAL
  Filled 2016-11-10 (×5): qty 237

## 2016-11-10 MED ORDER — LOPERAMIDE HCL 2 MG PO CAPS: 2.0000 mg | ORAL_CAPSULE | ORAL | Status: DC | PRN

## 2016-11-10 MED ORDER — HEPARIN SODIUM (PORCINE) 1000 UNIT/ML IJ SOLN
INTRAMUSCULAR | Status: AC
  Filled 2016-11-10: qty 1

## 2016-11-10 MED ORDER — ALBUTEROL SULFATE (2.5 MG/3ML) 0.083% IN NEBU
2.5000 mg | INHALATION_SOLUTION | Freq: Two times a day (BID) | RESPIRATORY_TRACT | Status: DC
Start: 2016-11-10 — End: 2016-11-11
  Administered 2016-11-10 – 2016-11-11 (×2): 2.5 mg via RESPIRATORY_TRACT
  Filled 2016-11-10 (×2): qty 3

## 2016-11-10 MED ORDER — TRAZODONE HCL 50 MG PO TABS
50.0000 mg | ORAL_TABLET | Freq: Every day | ORAL | Status: DC
  Administered 2016-11-10: 50 mg via ORAL
  Filled 2016-11-10: qty 1

## 2016-11-10 MED ORDER — SODIUM CHLORIDE 0.9 % IV SOLN: 1.0000 mL/kg/h | INTRAVENOUS | Status: AC

## 2016-11-10 MED ORDER — MIDAZOLAM HCL 2 MG/2ML IJ SOLN
INTRAMUSCULAR | Status: AC
  Filled 2016-11-10: qty 2

## 2016-11-10 MED ORDER — PANTOPRAZOLE SODIUM 40 MG PO TBEC
40.0000 mg | DELAYED_RELEASE_TABLET | Freq: Every day | ORAL | Status: DC
  Administered 2016-11-10 – 2016-11-11 (×2): 40 mg via ORAL
  Filled 2016-11-10 (×2): qty 1

## 2016-11-10 MED ORDER — VITAMIN D3 25 MCG (1000 UNIT) PO TABS
1000.0000 [IU] | ORAL_TABLET | Freq: Every day | ORAL | Status: DC
  Administered 2016-11-10 – 2016-11-11 (×3): 1000 [IU] via ORAL
  Filled 2016-11-10 (×4): qty 1

## 2016-11-10 MED ORDER — HYDRALAZINE HCL 20 MG/ML IJ SOLN
20.0000 mg | Freq: Four times a day (QID) | INTRAMUSCULAR | Status: DC | PRN
  Administered 2016-11-10: 20 mg via INTRAVENOUS
  Filled 2016-11-10: qty 1

## 2016-11-10 MED ORDER — OXYCODONE-ACETAMINOPHEN 5-325 MG PO TABS
ORAL_TABLET | ORAL | Status: AC
  Filled 2016-11-10: qty 1

## 2016-11-10 MED ORDER — ALLOPURINOL 100 MG PO TABS
100.0000 mg | ORAL_TABLET | Freq: Every day | ORAL | Status: DC
  Administered 2016-11-10 – 2016-11-11 (×2): 100 mg via ORAL
  Filled 2016-11-10 (×2): qty 1

## 2016-11-10 MED ORDER — ANGIOPLASTY BOOK
Freq: Once | Status: AC
  Administered 2016-11-10: 20:00:00
  Filled 2016-11-10: qty 1

## 2016-11-10 MED ORDER — FENTANYL CITRATE (PF) 100 MCG/2ML IJ SOLN
INTRAMUSCULAR | Status: DC | PRN
  Administered 2016-11-10: 25 ug via INTRAVENOUS
  Administered 2016-11-10 (×2): 50 ug via INTRAVENOUS

## 2016-11-10 MED ORDER — MIDAZOLAM HCL 2 MG/2ML IJ SOLN
INTRAMUSCULAR | Status: DC | PRN
  Administered 2016-11-10 (×2): 0.5 mg via INTRAVENOUS

## 2016-11-10 MED ORDER — MELOXICAM 7.5 MG PO TABS
7.5000 mg | ORAL_TABLET | Freq: Every day | ORAL | Status: DC
Start: 2016-11-10 — End: 2016-11-11
  Administered 2016-11-10 – 2016-11-11 (×2): 7.5 mg via ORAL
  Filled 2016-11-10 (×2): qty 1

## 2016-11-10 MED ORDER — MORPHINE SULFATE (PF) 2 MG/ML IV SOLN
2.0000 mg | INTRAVENOUS | Status: DC | PRN
  Administered 2016-11-10: 17:00:00 2 mg via INTRAVENOUS
  Filled 2016-11-10: qty 1

## 2016-11-10 MED ORDER — OXYCODONE HCL 5 MG PO TABS
5.0000 mg | ORAL_TABLET | ORAL | Status: DC | PRN
  Administered 2016-11-11: 10 mg via ORAL
  Filled 2016-11-10: qty 2

## 2016-11-10 MED ORDER — HEPARIN SODIUM (PORCINE) 1000 UNIT/ML IJ SOLN
INTRAMUSCULAR | Status: DC | PRN
  Administered 2016-11-10: 4000 [IU] via INTRAVENOUS
  Administered 2016-11-10: 8000 [IU] via INTRAVENOUS

## 2016-11-10 MED ORDER — LEVOTHYROXINE SODIUM 25 MCG PO TABS
12.5000 ug | ORAL_TABLET | Freq: Every day | ORAL | Status: DC
Start: 2016-11-11 — End: 2016-11-11
  Administered 2016-11-10 – 2016-11-11 (×2): 12.5 ug via ORAL
  Filled 2016-11-10 (×2): qty 1

## 2016-11-10 MED ORDER — FAMOTIDINE 20 MG PO TABS
20.0000 mg | ORAL_TABLET | Freq: Every day | ORAL | Status: DC
  Administered 2016-11-10 – 2016-11-11 (×3): 20 mg via ORAL
  Filled 2016-11-10 (×3): qty 1

## 2016-11-10 MED ORDER — GABAPENTIN 100 MG PO CAPS
200.0000 mg | ORAL_CAPSULE | Freq: Every day | ORAL | Status: DC
Start: 2016-11-10 — End: 2016-11-11
  Administered 2016-11-10: 200 mg via ORAL
  Filled 2016-11-10: qty 2

## 2016-11-10 MED ORDER — LIDOCAINE HCL 1 % IJ SOLN
INTRAMUSCULAR | Status: AC
  Filled 2016-11-10: qty 20

## 2016-11-10 MED ORDER — ADULT MULTIVITAMIN W/MINERALS CH
1.0000 | ORAL_TABLET | Freq: Every day | ORAL | Status: DC
  Administered 2016-11-11: 1 via ORAL
  Filled 2016-11-10 (×2): qty 1

## 2016-11-10 MED ORDER — FENTANYL CITRATE (PF) 100 MCG/2ML IJ SOLN
INTRAMUSCULAR | Status: AC
  Filled 2016-11-10: qty 2

## 2016-11-10 MED ORDER — SODIUM CHLORIDE 0.9 % IV SOLN
INTRAVENOUS | Status: DC
  Administered 2016-11-10 (×2): via INTRAVENOUS

## 2016-11-10 MED ORDER — VIPERSLIDE LUBRICANT OPTIME
TOPICAL | Status: DC | PRN
  Administered 2016-11-10: 13:00:00 via SURGICAL_CAVITY

## 2016-11-10 MED ORDER — HEPARIN SODIUM (PORCINE) 5000 UNIT/ML IJ SOLN
5000.0000 [IU] | Freq: Three times a day (TID) | INTRAMUSCULAR | Status: DC
  Administered 2016-11-11: 06:00:00 5000 [IU] via SUBCUTANEOUS
  Filled 2016-11-10: qty 1

## 2016-11-10 MED ORDER — ATROPINE SULFATE 1 MG/10ML IJ SOSY
PREFILLED_SYRINGE | INTRAMUSCULAR | Status: AC
  Filled 2016-11-10: qty 10

## 2016-11-10 MED ORDER — DOCUSATE SODIUM 100 MG PO CAPS
100.0000 mg | ORAL_CAPSULE | Freq: Two times a day (BID) | ORAL | Status: DC
  Administered 2016-11-10: 100 mg via ORAL
  Filled 2016-11-10 (×2): qty 1

## 2016-11-10 MED ORDER — IODIXANOL 320 MG/ML IV SOLN
INTRAVENOUS | Status: DC | PRN
  Administered 2016-11-10: 125 mL via INTRA_ARTERIAL

## 2016-11-10 MED ORDER — ASPIRIN 81 MG PO CHEW
81.0000 mg | CHEWABLE_TABLET | Freq: Every day | ORAL | Status: DC
  Administered 2016-11-10 – 2016-11-11 (×2): 81 mg via ORAL
  Filled 2016-11-10 (×2): qty 1

## 2016-11-10 MED ORDER — SACCHAROMYCES BOULARDII 250 MG PO CAPS
250.0000 mg | ORAL_CAPSULE | Freq: Two times a day (BID) | ORAL | Status: DC
  Administered 2016-11-10 – 2016-11-11 (×2): 250 mg via ORAL
  Filled 2016-11-10 (×2): qty 1

## 2016-11-10 MED ORDER — LIDOCAINE HCL (PF) 1 % IJ SOLN
INTRAMUSCULAR | Status: DC | PRN
  Administered 2016-11-10: 12 mL

## 2016-11-10 MED ORDER — NITROGLYCERIN 0.2 MG/HR TD PT24
0.2000 mg | MEDICATED_PATCH | Freq: Every day | TRANSDERMAL | Status: DC
  Administered 2016-11-11: 10:00:00 0.2 mg via TRANSDERMAL
  Filled 2016-11-10: qty 1

## 2016-11-10 MED ORDER — POLYETHYLENE GLYCOL 3350 17 G PO PACK
17.0000 g | PACK | Freq: Every day | ORAL | Status: DC
  Filled 2016-11-10 (×2): qty 1

## 2016-11-10 MED ORDER — ONDANSETRON HCL 4 MG/2ML IJ SOLN
4.0000 mg | Freq: Four times a day (QID) | INTRAMUSCULAR | Status: DC | PRN
  Administered 2016-11-11: 4 mg via INTRAVENOUS
  Filled 2016-11-10: qty 2

## 2016-11-10 MED ORDER — METHOCARBAMOL 500 MG PO TABS: 500.0000 mg | ORAL_TABLET | Freq: Four times a day (QID) | ORAL | Status: DC | PRN

## 2016-11-10 MED ORDER — OXYCODONE-ACETAMINOPHEN 5-325 MG PO TABS
1.0000 | ORAL_TABLET | ORAL | Status: DC | PRN
  Administered 2016-11-10: 1 via ORAL
  Administered 2016-11-11: 08:00:00 2 via ORAL
  Filled 2016-11-10: qty 2

## 2016-11-10 MED ORDER — SENNA 8.6 MG PO TABS
1.0000 | ORAL_TABLET | Freq: Two times a day (BID) | ORAL | Status: DC
  Filled 2016-11-10 (×2): qty 1

## 2016-11-10 MED ORDER — HEPARIN (PORCINE) IN NACL 2-0.9 UNIT/ML-% IJ SOLN
INTRAMUSCULAR | Status: AC
  Filled 2016-11-10: qty 1000

## 2016-11-10 MED ORDER — CLOPIDOGREL BISULFATE 75 MG PO TABS
75.0000 mg | ORAL_TABLET | Freq: Every day | ORAL | Status: DC
  Administered 2016-11-10: 21:00:00 75 mg via ORAL
  Filled 2016-11-10: qty 1

## 2016-11-10 SURGICAL SUPPLY — 43 items
BALLN COYOTE ES OTW 1.5X20X142 (BALLOONS) ×3
BALLN COYOTE ES OTW 2.5X20X143 (BALLOONS) ×3
BALLN COYOTE OTW 3X220X150 (BALLOONS) ×3
BALLN IN.PACT DCB 4X60 (BALLOONS) ×3
BALLN STERLING OTW 6X220X150 (BALLOONS) ×3
BALLN STERLING OTW 7X80X135 (BALLOONS) ×3
BALLN STERLING SL OTW 2X80X150 (BALLOONS) ×3
BALLOON COYOTE OTW 3X220X150 (BALLOONS) ×2 IMPLANT
BALLOON CYTE ES OTW 1.5X20X142 (BALLOONS) ×2 IMPLANT
BALLOON CYTE ES OTW 2.5X20X143 (BALLOONS) ×2 IMPLANT
BALLOON STERLING OTW 6X220X150 (BALLOONS) ×2 IMPLANT
BALLOON STERLING OTW 7X80X135 (BALLOONS) ×2 IMPLANT
BALLOON STRLNG SL OTW 2X80X150 (BALLOONS) ×2 IMPLANT
BUR JETSTREAM XC 2.4/3.4 (BURR) ×2 IMPLANT
BURR JETSTREAM XC 2.4/3.4 (BURR) ×3
CATH CXI SUPP ANG 2.6FR 150CM (MICROCATHETER) ×3 IMPLANT
CATH MICROGUIDE FINCRSS 150 CM (MICROCATHETER) ×2 IMPLANT
CATH OMNI FLUSH 5F 65CM (CATHETERS) ×3 IMPLANT
CATH QUICKCROSS .018X135CM (MICROCATHETER) ×3 IMPLANT
CATH QUICKCROSS .035X135CM (MICROCATHETER) ×3 IMPLANT
COVER PRB 48X5XTLSCP FOLD TPE (BAG) ×2 IMPLANT
COVER PROBE 5X48 (BAG) ×1
DCB IN.PACT 4X60 (BALLOONS) ×2 IMPLANT
DEVICE EMBOSHIELD NAV6 2.5-4.8 (WIRE) ×3 IMPLANT
DEVICE TORQUE .014-.018 (MISCELLANEOUS) ×2 IMPLANT
DEVICE TORQUE .025-.038 (MISCELLANEOUS) ×3 IMPLANT
GUIDEWIRE ANGLED .035X260CM (WIRE) ×3 IMPLANT
GUIDEWIRE STR TIP .014X300X8 (WIRE) ×3 IMPLANT
KIT ENCORE 26 ADVANTAGE (KITS) ×3 IMPLANT
KIT MICROINTRODUCER STIFF 5F (SHEATH) ×3 IMPLANT
KIT PV (KITS) ×3 IMPLANT
LUBRICANT VIPERSLIDE CORONARY (MISCELLANEOUS) ×3 IMPLANT
MICROGUIDE FINECROSS 150 CM (MICROCATHETER) ×3
SHEATH FLEXOR ANSEL 1 7F 45CM (SHEATH) ×3 IMPLANT
SHEATH PINNACLE 5F 10CM (SHEATH) ×3 IMPLANT
STENT VIABAHN 6X100X120 (Permanent Stent) ×3 IMPLANT
SYR MEDRAD MARK V 150ML (SYRINGE) ×3 IMPLANT
TORQUE DEVICE .014-.018 (MISCELLANEOUS) ×3
TRANSDUCER W/STOPCOCK (MISCELLANEOUS) ×3 IMPLANT
TRAY PV CATH (CUSTOM PROCEDURE TRAY) ×3 IMPLANT
WIRE BENTSON .035X145CM (WIRE) ×3 IMPLANT
WIRE G V18X300CM (WIRE) ×3 IMPLANT
WIRE VIPER ADVANCE .017X335CM (WIRE) ×3 IMPLANT

## 2016-11-10 NOTE — H&P (Signed)
   History and Physical Update  The patient was interviewed and re-examined.  The patient's previous History and Physical has been reviewed and is unchanged from office visit. Plan for aortogram with Left lower extremity angiogram and possible intervention.   [facility] [place] Office: [phone] Pager: [phone]  11/10/2016, 11:09 AM

## 2016-11-10 NOTE — Progress Notes (Signed)
Site area: right groin  Site Prior to Removal:  Level 0  Pressure Applied For 20 MINUTES    Minutes Beginning at 1800  Manual:   Yes.    Patient Status During Pull:  [id number]  Post Pull Groin Site:  Level 0  Post Pull Instructions Given:  Yes.    Post Pull Pulses Present:  Yes.    Dressing Applied:  Yes.    Comments:  TOLERATED PROCEDURE WELL

## 2016-11-10 NOTE — Op Note (Signed)
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 Randie Heinz, MD Procedure Performed: 1.  US guided cannulation of right common femoral artery 2.  Left lower extremity angiogram 3.  jetstream atherectomy of in stent occlusion left sfa and popliteal arteries 4.  Balloon angioplasty of left peroneal artery with 3mm balloon 5.  Drug coated balloon angioplasty of left popliteal artery and stent with 4mm balloon 6.  Balloon angioplasty of left sfa stents with 7mm balloon 7.  Stent of left sfa in stent residual stenosis with 6mm x 10cm viabahn  8.  Moderate sedation for 158 minutes with fentanyl and versed   Indications:  76 year old male with left Lotrimin a critical limb ischemia previously underwent revascularization from an antegrade and retrograde approach now has known occlusion of the stents. He is now indicated for repeat attempt at revascularization from endovascular approach.  Findings: The SFA stents were occluded all the way through the popliteal artery on the left. He reconstituted a distal PT and peroneal artery. We were able to get through the stents intraluminal access and get into the peroneal artery. We then performed atherectomy with a jet stream of the in-stent stenosis followed by balloon angioplasty of the peroneal artery popliteal with a drug-coated balloon and the in-stent stenosis with a 7 mm balloon. Following this we then repaired stented the in-stent stenosis with 6 cm viabahn stent. A completion we had brisk flow through the stents and in line via the peroneal artery.  If this fails. The patient he will need consideration of above-knee amputation on the left versus femoral to distal peroneal artery bypass.   Procedure:  The patient was identified in the holding area and taken to room 8.  The patient was  then placed supine on the table and prepped and draped in the usual sterile fashion.  A time out was called.  Ultrasound was used to evaluate the right common femoral artery which was cannulated with micropuncture needle followed by wire and sheath. An exchange for a 5 French sheath went up and over the bifurcation with a Omni catheter. Informed angiogram the left lower extremity for the above findings. We then placed a stiff wire exchanged for long 7 French sheath. We then used Glidewire quick cross catheter to cross follow through the stents get down below the knee. A v18 was then used to cross peroneal artery. We could not get any catheters across this and so we had to first predilated with a 1.5 followed by 2.5 mm balloon. Following this we exchanged for a viper wire and placed a 2.5-4 mm embolic protection device. We then perform jetstream atherectomy throughout the entirety of the SFA and popliteal artery stents our existing. We then performed balloon angioplasty with 3 mm balloon and the peroneal followed by a 4 mm at the distal stent in the popliteal artery followed by 6 mm and 7 mm balloons throughout the entire SFA stents. At completion there was probably residual thrombus in the proximal stents this was restented with a viabahn 6 mm x 10 mm. We also had to inflate at low pressure for long inflation with a 3 mm balloon distally for what appeared to be reversed residual dissection. Ultimately a completion we had brisk runoff through the stents with no residual stenosis there. The stented peroneal artery which had been occluded now had minimal residual stenosis with  no flow limiting dissection. There was a strong signal at the level of the ankle and the peroneal artery that was not present prior. Satisfied with this we elected to terminate the procedure the sheath was withdrawn into the external iliac artery on the right the wires and balloons were removed. Patient tolerated procedure well without immediate  complication.   Contrast: 125cc  Gini Caputo C. Randie Heinz, MD [facility] [place] Office: [phone] Pager: [phone]

## 2016-11-11 ENCOUNTER — Encounter (HOSPITAL_COMMUNITY): Payer: Self-pay | Admitting: Vascular Surgery

## 2016-11-11 DIAGNOSIS — I70249 Atherosclerosis of native arteries of left leg with ulceration of unspecified site: Secondary | ICD-10-CM | POA: Diagnosis not present

## 2016-11-11 LAB — BASIC METABOLIC PANEL
Anion gap: 6 (ref 5–15)
BUN: 11 mg/dL (ref 6–20)
CO2: 25 mmol/L (ref 22–32)
Calcium: 8.2 mg/dL — ABNORMAL LOW (ref 8.9–10.3)
Chloride: 105 mmol/L (ref 101–111)
Creatinine, Ser: 1.15 mg/dL (ref 0.61–1.24)
GFR calc Af Amer: >60 mL/min (ref >60)
Glucose, Bld: 104 mg/dL — ABNORMAL HIGH (ref 65–99)
POTASSIUM: 3.6 mmol/L (ref 3.5–5.1)
Sodium: 136 mmol/L (ref 135–145)

## 2016-11-11 LAB — CBC
HEMATOCRIT: 31.1 % — AB (ref 39.0–52.0)
Hemoglobin: 9.4 g/dL — ABNORMAL LOW (ref 13.0–17.0)
MCH: 22.7 pg — ABNORMAL LOW (ref 26.0–34.0)
MCHC: 30.2 g/dL (ref 30.0–36.0)
MCV: 75.1 fL — AB (ref 78.0–100.0)
Platelets: 227 10*3/uL (ref 150–400)
RBC: 4.14 MIL/uL — ABNORMAL LOW (ref 4.22–5.81)
RDW: 17.2 % — AB (ref 11.5–15.5)
WBC: 9.6 10*3/uL (ref 4.0–10.5)

## 2016-11-11 MED ORDER — OXYCODONE HCL 5 MG PO TABS: 5.0000 mg | ORAL_TABLET | ORAL | 0 refills | Status: DC | PRN

## 2016-11-11 NOTE — Clinical Social Work Note (Signed)
Clinical Social Work Assessment  Patient Details  Name: Caleb Hays MRN: [id number] Date of Birth: 08/02/1940  Date of referral:  11/11/16               Reason for consult:  Facility Placement (Patient from [facility])                Permission sought to share information with:  Family Supports Permission granted to share information::  Yes, Verbal Permission Granted  Name::     Daun Peacock  Agency::     Relationship::  Niece  Contact Information:  [phone]  Housing/Transportation Living arrangements for the past 2 months:  Skilled Nursing Facility [facility] since March 2017) Source of Information:  Patient, Other (Comment Required) (Chart review) Patient Interpreter Needed:  None Criminal Activity/Legal Involvement Pertinent to Current Situation/Hospitalization:  No - Comment as needed Significant Relationships:  Other Family Members (Patient said niece is only family. Her father (patient's brother) is deceased. ) Lives with:  Facility Resident Do you feel safe going back to the place where you live?  Yes Need for family participation in patient care:  Yes (Comment)  Care giving concerns: Patient expressed no concerns to CSW regarding his care at the skilled facility.   Social Worker assessment / plan:  CSW visited patient at the bedside. Mr. Martelle was sitting up in bed, alert/oriented and open to talking with CSW. Patient advised CSW that he has been at [facility] a little over a year. Mr. Weldon aware that he is discharging today and CSW clarified to patient that he would be transported back to [facility] by non-emergency ambulance. Patient was agreeable to CSW calling his niece to advise her of his discharge.  Employment status:  Retired Database administrator, [employer] [place] PT Recommendations:  Not assessed at this time Information / Referral to community resources:  Other (Comment Required) (None needed or requested as patient from  skilled facility)  Patient/Family's Response to care:  No concerns expressed regarding care during hospitalization.  Patient/Family's Understanding of and Emotional Response to Diagnosis, Current Treatment, and Prognosis: Not discussed.  Emotional Assessment Appearance:  Appears stated age Attitude/Demeanor/Rapport:  Other (Appropriate) Affect (typically observed):  Appropriate Orientation:  Oriented to Self, Oriented to Place, Oriented to  Time, Oriented to Situation Alcohol / Substance use:  Tobacco Use, Alcohol Use, Illicit Drugs (Patient reported that he quit smoking and quit drinking in 2015, and does not use illicit drugs) Psych involvement (Current and /or in the community):  No (Comment)  Discharge Needs  Concerns to be addressed:  Discharge Planning Concerns Readmission within the last 30 days:  No Current discharge risk:  None Barriers to Discharge:  No Barriers Identified   Cristobal Goldmann, LCSW 11/11/2016, 11:44 AM

## 2016-11-11 NOTE — Discharge Summary (Signed)
[facility] Discharge Summary   Patient ID:  Caleb Hays MRN: [id number] DOB/AGE: 75-Dec-1942 75 y.o.  Admit date: 11/10/2016 Discharge date: 11/11/2016 Date of Surgery: 11/10/2016 Surgeon: Surgeon(s): Maeola Harman, MD  Admission Diagnosis: PAD (peripheral artery disease) [facility]) [I73.9]  Discharge Diagnoses:  PAD (peripheral artery disease) (HCC) [I73.9]  Secondary Diagnoses: Past Medical History:  Diagnosis Date  . Adult failure to thrive    /notes 06/23/2015  . Anemia   . Arthritis    "hands, fingers" (11/10/2016  . COPD (chronic obstructive pulmonary disease) (HCC)    [employer]  . Critical lower limb ischemia   . 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 Depression    "periods of depression" (06/23/2015)  . Gout   . Hypothyroidism   . PAD (peripheral artery disease) (HCC)   . Physical deconditioning   . Pyelonephritis 06/23/2015  . Status post foot surgery    right fifth toe amputation by Dr. Lajoyce Hays     Procedure(s): Abdominal Aortogram w/Lower Extremity Peripheral Vascular Intervention Peripheral Vascular Atherectomy  Discharged Condition: good  HPI: Caleb Hays is a 76 y.o. male follows up from recent left femoral to anterior tibial artery stenting via retrograde approach. Subsequently had a left foot transmetatarsal amputation by Dr. Lajoyce Hays which is healing satisfactorily. He does complain of 6 out of 10 pain on the left. He underwent duplex today demonstrates that his stents are occluded from the superficial femoral all the way to the anterior tibial artery. He is wondering about repeat procedure to establish blood flow. He is unsure if he is taking aspirin or Plavix at this time.  Dr. Lajoyce Hays is following his TMA which is healing, but has some areas of concern.  Dr. Randie Heinz has planned an angiogram with possible intervention.      Hospital Course:  [name] is a 75 y.o. male is S/P  left Procedure(s):  1. Caleb Hays cannulation of right common femoral artery 2. Left lower extremity angiogram 3. jetstream atherectomy of in stent occlusion left sfa and popliteal arteries 4. Balloon angioplasty of left peroneal artery with 3mm balloon 5. Drug coated balloon angioplasty of left popliteal artery and stent with 4mm balloon 6. Balloon angioplasty of left sfa stents with 7mm balloon 7. Stent of left sfa in stent residual stenosis with 6mm x 10cm   Continue Plavix  May shower in 24 hours Continue wet to dry dressing on left TMA wound. Followed by Dr. Lajoyce Hays F/U with Dr. Randie Heinz in 2 weeks with ABI   Significant Diagnostic Studies: CBC Lab Results  Component Value Date   WBC 9.6 11/11/2016   HGB 9.4 (L) 11/11/2016   HCT 31.1 (L) 11/11/2016   MCV 75.1 (L) 11/11/2016   PLT 227 11/11/2016    BMET    Component Value Date/Time   NA 136 11/11/2016 0406   NA 141 09/27/2016   K 3.6 11/11/2016 0406   CL 105 11/11/2016 0406   CO2 25 11/11/2016 0406   GLUCOSE 104 (H) 11/11/2016 0406   BUN 11 11/11/2016 0406   BUN 24 (A) 09/27/2016   CREATININE 1.15 11/11/2016 0406   CREATININE 0.92 05/22/2014 1437   CALCIUM 8.2 (L) 11/11/2016 0406   GFRNONAA >60 11/11/2016 0406   GFRAA >60 11/11/2016 0406   COAG Lab Results  Component Value Date   INR 1.26 10/01/2015   INR 1.27 08/13/2015   INR 1.14 05/22/2014     Disposition:  Discharge to :Skilled nursing facility Discharge Instructions    Call MD for:  redness, tenderness, or signs of infection (pain, swelling, bleeding, redness, odor or green/yellow discharge around incision site)    Complete by:  As directed    Call MD for:  severe or increased pain, loss or decreased feeling  in affected limb(s)    Complete by:  As directed    Call MD for:  temperature >100.5    Complete by:  As directed    Discharge instructions    Complete by:  As directed    May shower daily starting in 24 hours.  Wet to dry dressing  changes left TMA site.  Remove right groin dressing prior to shower in 24 hours.   Increase activity slowly    Complete by:  As directed    Walk with assistance use walker or cane as needed   Resume previous diet    Complete by:  As directed      Allergies as of 11/11/2016   No Known Allergies     Medication List    TAKE these medications   albuterol 108 (90 Base) MCG/ACT inhaler Commonly known as:  PROVENTIL HFA;VENTOLIN HFA Inhale 2 puffs into the lungs 2 (two) times daily. 0900 & 1700   allopurinol 100 MG tablet Commonly known as:  ZYLOPRIM Take 100 mg by mouth daily. 0900   aspirin 81 MG chewable tablet Chew 81 mg by mouth daily.   cholecalciferol 1000 units tablet Commonly known as:  VITAMIN D Take 1,000 Units by mouth daily. (0900)   clopidogrel 75 MG tablet Commonly known as:  PLAVIX Take 1 tablet (75 mg total) by mouth daily with breakfast.   docusate sodium 100 MG capsule Commonly known as:  COLACE Take 100 mg by mouth 2 (two) times daily. 0900 & 1700   famotidine 20 MG tablet Commonly known as:  PEPCID Take 20 mg by mouth daily. 0900   gabapentin 100 MG capsule Commonly known as:  NEURONTIN Take 200 mg by mouth at bedtime. (2100)   levothyroxine 25 MCG tablet Commonly known as:  SYNTHROID, LEVOTHROID Take 12.5 mcg by mouth daily at 6 (six) AM.   loperamide 2 MG tablet Commonly known as:  IMODIUM A-D Take 2-4 mg by mouth every 4 (four) hours as needed for diarrhea or loose stools (Do not give if <3 stools or DX. w/c. diff or norovirus. Do not exceed 16 mg/24hrs.).   meloxicam 7.5 MG tablet Commonly known as:  MOBIC Take 7.5 mg by mouth daily. 0900   methocarbamol 500 MG tablet Commonly known as:  ROBAXIN Take 500 mg by mouth every 6 (six) hours as needed for muscle spasms.   multivitamin with minerals tablet Take 1 tablet by mouth daily. (0900)   nitroGLYCERIN 0.2 mg/hr patch Commonly known as:  NITRODUR - Dosed in mg/24 hr Place 1 patch (0.2  mg total) onto the skin daily. Rotate site along dorsum of foot daily.   omeprazole 20 MG capsule Commonly known as:  PRILOSEC Take 20 mg by mouth daily at 6 (six) AM.   oxyCODONE 5 MG immediate release tablet Commonly known as:  Oxy IR/ROXICODONE Take 5-10 mg by mouth every 4 (four) hours as needed (  for moderate pain,  for severe pain). What changed:  Another medication with the same name was added. Make sure you understand how and when to take each.   oxyCODONE 5 MG immediate release tablet Commonly known as:  Oxy IR/ROXICODONE Take 1-2  tablets (5-10 mg total) by mouth every 4 (four) hours as needed (  for moderate pain,  for severe pain). What changed:  You were already taking a medication with the same name, and this prescription was added. Make sure you understand how and when to take each.   polyethylene glycol packet Commonly known as:  MIRALAX / GLYCOLAX Take 17 g by mouth daily. (0900) Hold for loose stool   saccharomyces boulardii 250 MG capsule Commonly known as:  FLORASTOR Take 250 mg by mouth 2 (two) times daily.   senna 8.6 MG Tabs tablet Commonly known as:  SENOKOT Take 1 tablet (8.6 mg total) by mouth 2 (two) times daily.   traZODone 50 MG tablet Commonly known as:  DESYREL Take 50 mg by mouth at bedtime. (2100)      Verbal and written Discharge instructions given to the patient. Wound care per Discharge AVS Follow-up Information    Lemar Livings, MD Follow up in 2 week(s).   Specialties:  Vascular Surgery, Cardiology Why:  office will call for appointment Contact information: [address] [place] [address] [phone]           Signed: Clinton Gallant [facility] 11/11/2016, 7:52 AM

## 2016-11-11 NOTE — Progress Notes (Addendum)
[facility] [place]  Subjective  - Doing well.   Objective (!) 141/61 65 97.7 F (36.5 C) (Oral) 20 95%  Intake/Output Summary (Last 24 hours) at 11/11/16 0742 Last data filed at 11/11/16 2956  Gross per 24 hour  Intake            634.2 ml  Output              400 ml  Net            234.2 ml    Doppler signal left peroneal Open TMA wet to dry dressings daily Right groin stick site clean and dry without hematoma  Assessment/Planning: POD # 1  Procedure Performed: 1.  US guided cannulation of right common femoral artery 2.  Left lower extremity angiogram 3.  jetstream atherectomy of in stent occlusion left sfa and popliteal arteries 4.  Balloon angioplasty of left peroneal artery with 3mm balloon 5.  Drug coated balloon angioplasty of left popliteal artery and stent with 4mm balloon 6.  Balloon angioplasty of left sfa stents with 7mm balloon 7.  Stent of left sfa in stent residual stenosis with 6mm x 10cm   Continue Plavix  May shower in 24 hours Continue wet to dry dressing on left TMA wound. Followed by Dr. Lajoyce Corners F/U with Dr. Randie Hays in 2 weeks with ABI  Caleb Hays [facility] 11/11/2016 7:42 AM --  Laboratory Lab Results:  Recent Labs  11/10/16 1924 11/11/16 0406  WBC 12.0* 9.6  HGB 10.7* 9.4*  HCT 34.9* 31.1*  PLT 246 227   BMET  Recent Labs  11/10/16 0855 11/10/16 1924 11/11/16 0406  NA 140  --  136  K 3.8  --  3.6  CL 106  --  105  CO2  --   --  25  GLUCOSE 76  --  104*  BUN 11  --  11  CREATININE 1.10 0.98 1.15  CALCIUM  --   --  8.2*    COAG Lab Results  Component Value Date   INR 1.26 10/01/2015   INR 1.27 08/13/2015   INR 1.14 05/22/2014   No results found for: PTT  I have independently interviewed patient and agree with PA assessment and plan above. He can return to [facility] and will continue asa and plavix. Plan to see in a few weeks to see how foot is demarcating.   Caleb Toth C. Randie Heinz, MD [facility] [place] Office: [phone] Pager: [phone]

## 2016-11-11 NOTE — Care Management Note (Addendum)
Case Management Note  Patient Details  Name: Caleb Hays MRN: [id number] Date of Birth: 04-27-1941  Subjective/Objective:      From [facility], s/p pv intervention,  Will be on plavix, NCM notified CSW.                Action/Plan:   Expected Discharge Date:  11/11/16               Expected Discharge Plan:  Skilled Nursing Facility  In-House Referral:  Clinical Social Work  Discharge planning Services  CM Consult  Post Acute Care Choice:    Choice offered to:     DME Arranged:    DME Agency:     HH Arranged:    HH Agency:     Status of Service:  Completed, signed off  If discussed at [employer], dates discussed:    Additional Comments:  Leone Haven, RN 11/11/2016, 9:28 AM

## 2016-11-11 NOTE — [place] FL2 (Signed)
[place] MEDICAID FL2 LEVEL OF CARE SCREENING TOOL     IDENTIFICATION  Patient Name: Caleb Hays Birthdate: September 13, 1940 Sex: male Admission Date (Current Location): 11/10/2016  [place] and [place] Number:  [name] [id number] Q Facility and Address:  [facility], [name], [place], [place] [address]      Provider Number: [id number]  Attending Physician Name and Address:  Maeola Harman*  Relative Name and Phone Number:  Daun Peacock - Niece, [phone] (home), [phone] (mobile)     Current Level of Care: Hospital Recommended Level of Care: Skilled Nursing Facility Prior Approval Number:    Date Approved/Denied:   PASRR Number: [id number]  Discharge Plan: SNF (From [facility])    Current Diagnoses: Patient Active Problem List   Diagnosis Date Noted  . S/P transmetatarsal amputation of foot, left (HCC) 10/08/2016  . PAD (peripheral artery disease) (HCC) 09/15/2016  . Osteomyelitis (HCC) 07/22/2016  . Edema 05/05/2016  . Phantom pain (HCC) 03/08/2016  . Gout 03/08/2016  . Hypothyroidism (acquired) 03/08/2016  . CKD (chronic kidney disease) stage 3, GFR 30-59 ml/min 03/08/2016  . GERD without esophagitis 03/08/2016  . Constipation 12/29/2015  . Below knee amputation status (HCC) 10/01/2015  . Ankle pain 08/13/2015  . Adult failure to thrive   . PVD (peripheral vascular disease) (HCC)   . Elevated blood uric acid level 08/10/2015  . 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 Anemia of chronic disease 08/10/2015  . Malnutrition of moderate degree 06/24/2015  . Failure to thrive in adult 06/23/2015  . Weight loss 06/23/2015  . Tobacco abuse 06/23/2015  . COPD (chronic obstructive pulmonary disease) (HCC) 06/23/2015  . Fall   . Critical lower limb ischemia 05/31/2014  . Status post right foot surgery 03/12/2014    Orientation RESPIRATION BLADDER Height & Weight     Self, Time, Situation, Place  Normal Continent  Weight: 157 lb 10.1 oz (71.5 kg) Height:   (193 cm)  BEHAVIORAL SYMPTOMS/MOOD NEUROLOGICAL BOWEL NUTRITION STATUS      Continent Diet (Heart healthy)  AMBULATORY STATUS COMMUNICATION OF NEEDS Skin   Extensive Assist (10/08/16 - transmetatarsal amputation of foot, left; 10/01/15 - Right BKA ) Verbally Other (Comment) (Incision dehisced)                       Personal Care Assistance Level of Assistance  Bathing, Feeding, Dressing Bathing Assistance: Limited assistance Feeding assistance: Independent Dressing Assistance: Limited assistance     Functional Limitations Info  Sight, Hearing, Speech Sight Info: Adequate Hearing Info: Adequate Speech Info: Adequate    SPECIAL CARE FACTORS FREQUENCY                       Contractures Contractures Info: Not present    Additional Factors Info  Code Status, Allergies Code Status Info: Full Allergies Info: No known allergies           Current Medications (11/11/2016):  This is the current hospital active medication list Current Facility-Administered Medications  Medication Dose Route Frequency Provider Last Rate Last Dose  . acetaminophen (TYLENOL) tablet 650 mg  650 mg Oral Q4H PRN Maeola Harman, MD   650 mg at 11/10/16 2051  . albuterol (PROVENTIL) (2.5 MG/3ML) 0.083% nebulizer solution 2.5 mg  2.5 mg Inhalation BID Maeola Harman, MD   2.5 mg at 11/11/16 0914  . allopurinol (ZYLOPRIM) tablet 100 mg  100 mg Oral Daily Maeola Harman, MD  100 mg at 11/11/16 0928  . aspirin chewable tablet 81 mg  81 mg Oral Daily Maeola Harman, MD   81 mg at 11/11/16 0929  . cholecalciferol (VITAMIN D) tablet 1,000 Units  1,000 Units Oral Daily Maeola Harman, MD   1,000 Units at 11/11/16 [phone]  . clopidogrel (PLAVIX) tablet 75 mg  75 mg Oral Q breakfast Maeola Harman, MD   75 mg at 11/10/16 2047  . docusate sodium (COLACE) capsule 100 mg  100 mg Oral BID Maeola Harman, MD   100 mg at 11/10/16 1738  . famotidine (PEPCID) tablet 20 mg  20 mg Oral Daily Maeola Harman, MD   20 mg at 11/11/16 4010  . feeding supplement (ENSURE ENLIVE) (ENSURE ENLIVE) liquid 237 mL  237 mL Oral BID BM Maeola Harman, MD   237 mL at 11/11/16 1100  . gabapentin (NEURONTIN) capsule 200 mg  200 mg Oral QHS Maeola Harman, MD   200 mg at 11/10/16 2047  . heparin injection 5,000 Units  5,000 Units Subcutaneous Q8H Maeola Harman, MD   5,000 Units at 11/11/16 [phone]  . hydrALAZINE (APRESOLINE) injection 20 mg  20 mg Intravenous Q6H PRN Maeola Harman, MD   20 mg at 11/10/16 1733  . levothyroxine (SYNTHROID, LEVOTHROID) tablet 12.5 mcg  12.5 mcg Oral QAC breakfast Maeola Harman, MD   12.5 mcg at 11/11/16 [phone]  . loperamide (IMODIUM) capsule 2-4 mg  2-4 mg Oral Q4H PRN Maeola Harman, MD      . meloxicam [facility]) tablet 7.5 mg  7.5 mg Oral Daily Maeola Harman, MD   7.5 mg at 11/11/16 0930  . methocarbamol (ROBAXIN) tablet 500 mg  500 mg Oral Q6H PRN Maeola Harman, MD      . morphine 2 MG/ML injection 2 mg  2 mg Intravenous Q1H PRN Maeola Harman, MD   2 mg at 11/10/16 1728  . multivitamin with minerals tablet 1 tablet  1 tablet Oral Daily Maeola Harman, MD   1 tablet at 11/11/16 4034  . nitroGLYCERIN (NITRODUR - Dosed in mg/24 hr) patch 0.2 mg  0.2 mg Transdermal Daily Maeola Harman, MD   0.2 mg at 11/11/16 0934  . ondansetron (ZOFRAN) injection 4 mg  4 mg Intravenous Q6H PRN Maeola Harman, MD   4 mg at 11/11/16 0724  . oxyCODONE (Oxy IR/ROXICODONE) immediate release tablet 5-10 mg  5-10 mg Oral Q4H PRN Maeola Harman, MD   10 mg at 11/11/16 0102  . oxyCODONE-acetaminophen (PERCOCET/ROXICET) 5-325 MG per tablet 1-2 tablet  1-2 tablet Oral Q3H PRN Maeola Harman, MD   2 tablet at 11/11/16 0747  . pantoprazole (PROTONIX) EC tablet 40 mg  40  mg Oral Daily Maeola Harman, MD   40 mg at 11/11/16 7425  . polyethylene glycol (MIRALAX / GLYCOLAX) packet 17 g  17 g Oral Daily Maeola Harman, MD      . saccharomyces boulardii [facility]) capsule 250 mg  250 mg Oral BID Maeola Harman, MD   250 mg at 11/11/16 9563  . senna (SENOKOT) tablet 8.6 mg  1 tablet Oral BID Maeola Harman, MD      . traZODone (DESYREL) tablet 50 mg  50 mg Oral QHS Maeola Harman, MD   50 mg at 11/10/16 2048     Discharge Medications: Please see discharge summary for a list of discharge medications.  Relevant Imaging  Results:  Relevant Lab Results:   Additional Information ss#[id number]. Patient had procedure on 11/10/16-US guided cannulation of right common femoral artery, Left lower extremity angiogram, jetstream atherectomy of in stent occlusion left sfa and popliteal arteries, Balloon angioplasty of left peroneal artery with 3mm balloon, Drug coated balloon angioplasty of left popliteal artery and stent with 4mm balloon, Balloon angioplasty of left sfa stents with 7mm balloon, Stent of left sfa in stent residual stenosis with 6mm x 10cm   Okey Dupre Lazaro Arms, LCSW

## 2016-11-11 NOTE — Care Management Obs Status (Signed)
MEDICARE OBSERVATION STATUS NOTIFICATION   Patient Details  Name: Caleb Hays MRN: [id number] Date of Birth: [birth year]   Medicare Observation Status Notification Given:  Yes    Leone Haven, RN 11/11/2016, 9:49 AM

## 2016-11-11 NOTE — Clinical Social Work Note (Signed)
Patient medically stable to discharge back to [facility] today. Facility admissions staff person, Rene Kocher notified and discharge clinicals sent through the HUB. Patient gave permission for his niece to be contacted. Niece Daun Peacock was contacted [phone]) and informed of patient's discharge back to facility.  Genelle Bal, MSW, LCSW Licensed Clinical Social Worker Clinical Social Work Department [employer] [phone]

## 2016-11-12 ENCOUNTER — Telehealth: Payer: Self-pay | Admitting: Vascular Surgery

## 2016-11-12 ENCOUNTER — Telehealth: Payer: Self-pay

## 2016-11-12 ENCOUNTER — Encounter: Payer: Medicare HMO | Admitting: Vascular Surgery

## 2016-11-12 ENCOUNTER — Ambulatory Visit (HOSPITAL_COMMUNITY): Payer: Medicare HMO

## 2016-11-12 NOTE — Telephone Encounter (Signed)
This is a patient of [facility], who was admitted to [facility] after hospitalization. [facility] F/U is needed. Hospital discharge from [place] on 11/11/2016

## 2016-11-12 NOTE — Telephone Encounter (Signed)
-----   Message from Sharee Pimple, RN sent at 11/11/2016 12:16 PM EDT ----- Regarding: Additional information about appt per Dr. Randie Heinz   ----- Message ----- From: Maeola Harman, MD Sent: 11/11/2016  11:03 AM To: Vvs Charge [address]  DEERIC CRUISE [id number] September 27, 1940  I think Marisue Humble already sent f/u but he also needs Left leg duplex with his appointment. If needed we can push his appointment back a couple of weeks to get that done.   brandno

## 2016-11-12 NOTE — Telephone Encounter (Signed)
Labs scheduled 6/14 @ 12:30 and Office visit scheduled 6/15 @ 11:45am

## 2016-11-15 ENCOUNTER — Non-Acute Institutional Stay (SKILLED_NURSING_FACILITY): Payer: Medicare HMO | Admitting: Internal Medicine

## 2016-11-15 ENCOUNTER — Encounter: Payer: Self-pay | Admitting: Internal Medicine

## 2016-11-15 DIAGNOSIS — D638 Anemia in other chronic diseases classified elsewhere: Secondary | ICD-10-CM

## 2016-11-15 DIAGNOSIS — N183 Chronic kidney disease, stage 3 unspecified: Secondary | ICD-10-CM

## 2016-11-15 DIAGNOSIS — Z89432 Acquired absence of left foot: Secondary | ICD-10-CM | POA: Diagnosis not present

## 2016-11-15 DIAGNOSIS — I739 Peripheral vascular disease, unspecified: Secondary | ICD-10-CM

## 2016-11-15 DIAGNOSIS — R2681 Unsteadiness on feet: Secondary | ICD-10-CM

## 2016-11-15 DIAGNOSIS — I70229 Atherosclerosis of native arteries of extremities with rest pain, unspecified extremity: Secondary | ICD-10-CM

## 2016-11-15 DIAGNOSIS — I998 Other disorder of circulatory system: Secondary | ICD-10-CM

## 2016-11-15 DIAGNOSIS — Z89511 Acquired absence of right leg below knee: Secondary | ICD-10-CM | POA: Diagnosis not present

## 2016-11-15 NOTE — Progress Notes (Signed)
LOCATION: [facility]  PCP: Oneal Grout, MD   Code Status: full code  Goals of care: Advanced Directive information Advanced Directives 11/15/2016  Does Patient Have a Medical Advance Directive? No  Type of Advance Directive -  Does patient want to make changes to medical advance directive? -  Copy of Healthcare Power of Attorney in Chart? -  Would patient like information on creating a medical advance directive? No - Patient declined       Extended Emergency Contact Information Primary Emergency Contact: [name],[name] Address: [address]          [place], [place] [address] [place] Home Phone: [phone] Mobile Phone: [phone] Relation: Niece Secondary Emergency Contact: [name] Home Phone: [phone] Mobile Phone: [phone] Relation: Friend   No Known Allergies  Chief Complaint  Patient presents with  . Readmit To SNF    Readmission     HPI:  Patient is a 76 y.o. male seen today for long term care post hospital re-admission from 11/10/16-11/11/16 with severe PAD with worsening left leg pain. Duplex study showed his stents to be occluded from superifical femoral to anterior tibial artery. He recently had left femoral to anterior tibial artery stenting and left foot transmetatarsal amputation. He underwent ultrasound guided cannulation of right common femoral artery, LLE angiogram and arthrectomy of in stent occlusion left SFA and popliteal arteries, balloon angioplasty of left peroneal artery, left SFA stent and left popliteal artery and placement of stent to left SFA. His left leg pain has resolved for now. He is seen in his room today. He continues to smoke 1 cigarette a day.   Review of Systems:  Constitutional: Negative for fever, chills, diaphoresis.  HENT: Negative for headache, congestion, nasal discharge, sore throat, difficulty swallowing.   Eyes: Negative for eye pain, blurred vision, double vision  and discharge.  Respiratory: Negative for cough, shortness of breath and wheezing.   Cardiovascular: Negative for chest pain, palpitations.  Gastrointestinal: Negative for heartburn, nausea, vomiting, abdominal pain, loss of appetite. Genitourinary: Negative for dysuria and flank pain.  Musculoskeletal: Negative for back pain, fall.  Skin: Negative for itching, rash.  Neurological: Negative for dizziness. Psychiatric/Behavioral: Negative for depression.   Past Medical History:  Diagnosis Date  . Adult failure to thrive    /notes 06/23/2015  . Anemia   . Arthritis    "hands, fingers" (11/10/2016  . COPD (chronic obstructive pulmonary disease) (HCC)    LONG TIME SMOKER  . Critical lower limb ischemia   . 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 Depression    "periods of depression" (06/23/2015)  . Gout   . Hypothyroidism   . PAD (peripheral artery disease) (HCC)   . Physical deconditioning   . Pyelonephritis 06/23/2015  . Status post foot surgery    right fifth toe amputation by Dr. Lajoyce Corners    Past Surgical History:  Procedure Laterality Date  . ABDOMINAL AORTOGRAM W/LOWER EXTREMITY N/A 09/03/2016   Procedure: Abdominal Aortogram w/Lower Extremity;  Surgeon: Sherren Kerns, MD;  Location: [facility] INVASIVE CV LAB;  Service: Cardiovascular;  Laterality: N/A;  Lt. leg  . ABDOMINAL AORTOGRAM W/LOWER EXTREMITY N/A 11/10/2016   Procedure: Abdominal Aortogram w/Lower Extremity;  Surgeon: Maeola Harman, MD;  Location: [facility] INVASIVE CV LAB;  Service: Cardiovascular;  Laterality: N/A;  . AMPUTATION Right 03/01/2014   Procedure: AMPUTATION RAY;  Surgeon: Nadara Mustard, MD;  Location: [facility] OR;  Service: Orthopedics;  Laterality: Right;  Right Foot 5th Ray Amputation  . AMPUTATION Right 04/26/2014   Procedure: Right Foot 4th Ray Amputation;  Surgeon: Nadara Mustard, MD;  Location: [facility] OR;  Service: Orthopedics;  Laterality: Right;  . AMPUTATION Right 10/01/2015    Procedure: Right Below Knee Amputation;  Surgeon: Nadara Mustard, MD;  Location: [facility] OR;  Service: Orthopedics;  Laterality: Right;  . AMPUTATION Left 07/23/2016   Procedure: Left 2nd Toe Amputation;  Surgeon: Nadara Mustard, MD;  Location: [facility] OR;  Service: Orthopedics;  Laterality: Left;  . AMPUTATION Left 10/08/2016   Procedure: Left Transmetatarsal Amputation;  Surgeon: Nadara Mustard, MD;  Location: [facility] OR;  Service: Orthopedics;  Laterality: Left;  . ANKLE ARTHROSCOPY Right 08/13/2015   Procedure: ANKLE ARTHROSCOPY;  Surgeon: Nadara Mustard, MD;  Location: [facility] OR;  Service: Orthopedics;  Laterality: Right;  . CATARACT EXTRACTION W/ INTRAOCULAR LENS  IMPLANT, BILATERAL Bilateral   . INGUINAL HERNIA REPAIR Left   . KNEE CARTILAGE SURGERY Left 1960's   football injury  . KNEE LIGAMENT RECONSTRUCTION Left 1960's  . LOWER EXTREMITY ANGIOGRAM N/A 05/27/2014   Procedure: LOWER EXTREMITY ANGIOGRAM;  Surgeon: Runell Gess, MD;  Location: [facility] CATH LAB;  Service: Cardiovascular;  Laterality: N/A;  . LOWER EXTREMITY ANGIOGRAPHY Left 09/15/2016   Procedure: Lower Extremity Angiography;  Surgeon: Maeola Harman, MD;  Location: [facility] INVASIVE CV LAB;  Service: Cardiovascular;  Laterality: Left;  . PERIPHERAL VASCULAR ATHERECTOMY  11/10/2016   Procedure: Peripheral Vascular Atherectomy;  Surgeon: Maeola Harman, MD;  Location: [facility] INVASIVE CV LAB;  Service: Cardiovascular;;  . PERIPHERAL VASCULAR INTERVENTION Left 09/15/2016   Procedure: Peripheral Vascular Intervention;  Surgeon: Maeola Harman, MD;  Location: [facility] INVASIVE CV LAB;  Service: Cardiovascular;  Laterality: Left;  SFA/popiteal  . PERIPHERAL VASCULAR INTERVENTION  11/10/2016   Hattie Perch 11/10/2016  . PERIPHERAL VASCULAR INTERVENTION  11/10/2016   Procedure: Peripheral Vascular Intervention;  Surgeon: Maeola Harman, MD;  Location: [facility] INVASIVE CV LAB;  Service: Cardiovascular;;  . REFRACTIVE SURGERY Bilateral   . STUMP  REVISION Right 12/05/2015   Procedure: Revision Right Below Knee Amputation;  Surgeon: Nadara Mustard, MD;  Location: [facility] OR;  Service: Orthopedics;  Laterality: Right;   Social History:   reports that he quit smoking about 7 months ago. His smoking use included Cigarettes. He has a 28.50 pack-year smoking history. He has never used smokeless tobacco. He reports that he drinks alcohol. He reports that he does not use drugs.  Family History  Problem Relation Age of Onset  . Heart failure Father   . Heart attack Father   . Heart failure Brother     Medications: Allergies as of 11/15/2016   No Known Allergies     Medication List       Accurate as of 11/15/16  3:34 PM. Always use your most recent med list.          albuterol 108 (90 Base) MCG/ACT inhaler Commonly known as:  PROVENTIL HFA;VENTOLIN HFA Inhale 2 puffs into the lungs 2 (two) times daily. 0900 & 1700   allopurinol 100 MG tablet Commonly known as:  ZYLOPRIM Take 100 mg by mouth daily. 0900   aspirin 81 MG chewable tablet Chew 81 mg by mouth daily.   cholecalciferol 1000 units tablet Commonly known as:  VITAMIN D Take 1,000 Units by mouth daily. (0900)   clopidogrel 75 MG tablet Commonly known as:  PLAVIX Take 1 tablet (75 mg  total) by mouth daily with breakfast.   docusate sodium 100 MG capsule Commonly known as:  COLACE Take 100 mg by mouth 2 (two) times daily. 0900 & 1700   DULoxetine 20 MG capsule Commonly known as:  CYMBALTA Take 20 mg by mouth daily.   famotidine 20 MG tablet Commonly known as:  PEPCID Take 20 mg by mouth daily. 0900   gabapentin 100 MG capsule Commonly known as:  NEURONTIN Take 200 mg by mouth at bedtime. (2100)   levothyroxine 25 MCG tablet Commonly known as:  SYNTHROID, LEVOTHROID Take 12.5 mcg by mouth daily at 6 (six) AM.   loperamide 2 MG tablet Commonly known as:  IMODIUM A-D Take 2-4 mg by mouth every 4 (four) hours as needed for diarrhea or loose stools (Do not give  if <3 stools or DX. w/c. diff or norovirus. Do not exceed 16 mg/24hrs.).   meloxicam 7.5 MG tablet Commonly known as:  MOBIC Take 7.5 mg by mouth daily. 0900   methocarbamol 500 MG tablet Commonly known as:  ROBAXIN Take 500 mg by mouth 2 (two) times daily.   multivitamin with minerals tablet Take 1 tablet by mouth daily. (0900)   nitroGLYCERIN 0.2 mg/hr patch Commonly known as:  NITRODUR - Dosed in mg/24 hr Place 1 patch (0.2 mg total) onto the skin daily. Rotate site along dorsum of foot daily.   omeprazole 20 MG capsule Commonly known as:  PRILOSEC Take 20 mg by mouth daily at 6 (six) AM.   oxyCODONE 5 MG immediate release tablet Commonly known as:  Oxy IR/ROXICODONE Take 5-10 mg by mouth every 4 (four) hours as needed (  for moderate pain,  for severe pain).   oxyCODONE 5 MG immediate release tablet Commonly known as:  Oxy IR/ROXICODONE Take 1-2 tablets (5-10 mg total) by mouth every 4 (four) hours as needed (  for moderate pain,  for severe pain).   polyethylene glycol packet Commonly known as:  MIRALAX / GLYCOLAX Take 17 g by mouth daily. (0900) Hold for loose stool   saccharomyces boulardii 250 MG capsule Commonly known as:  FLORASTOR Take 250 mg by mouth 2 (two) times daily.   senna 8.6 MG Tabs tablet Commonly known as:  SENOKOT Take 1 tablet (8.6 mg total) by mouth 2 (two) times daily.   traZODone 50 MG tablet Commonly known as:  DESYREL Take 50 mg by mouth at bedtime. (2100)       Immunizations: Immunization History  Administered Date(s) Administered  . Influenza,inj,Quad PF,36+ Mos 06/25/2015  . PPD Test 10/03/2015, 10/17/2015  . Pneumococcal Conjugate-13 10/03/2015  . Pneumococcal Polysaccharide-23 06/25/2015     Physical Exam: Vitals:   11/15/16 1456  BP: (!) 144/71  Pulse: 64  Resp: 20  Temp: 97.9 F (36.6 C)  TempSrc: Oral  SpO2: 98%  Weight: 147 lb 11.2 oz (67 kg)  Height:  (1.93 m)   Body mass index is 17.98  kg/m.  General- elderly frail and thin built male, chronically ill appearing, in no acute distress Head- normocephalic, atraumatic Nose- no nasal discharge Throat- moist mucus membrane, normal oropharynx Eyes- PERRLA, EOMI, no pallor, no icterus, no discharge, normal conjunctiva, normal sclera Neck- no cervical lymphadenopathy Cardiovascular- normal s1,s2, no murmur Respiratory- bilateral clear to auscultation, no wheeze, no rhonchi, no crackles, no use of accessory muscles Abdomen- bowel sounds present, soft, non tender, no guarding or rigidity Musculoskeletal- right BKA, able to move all other 3 extremities, left TMA with dressing in place Neurological- alert and  oriented to person, place and time Skin- warm and dry, right groin angiogram site clean and dry, no hematoma Psychiatry- normal mood and affect    Labs reviewed: Basic Metabolic Panel:  Recent Labs  40/98/11 0206  10/08/16 1211 10/15/16 11/10/16 0855 11/10/16 1924 11/11/16 0406  NA 139  < > 141 141 140  --  136  K 4.7  < > 4.3 4.3 3.8  --  3.6  CL 109  --  111  --  106  --  105  CO2 24  --  21*  --   --   --  25  GLUCOSE 93  --  85  --  76  --  104*  BUN 19  < > --  11  CREATININE 1.22  < > 1.27* 1.1 1.10 0.98 1.15  CALCIUM 8.6*  --  8.9  --   --   --  8.2*  < > = values in this interval not displayed. Liver Function Tests:  Recent Labs  04/12/16 10/15/16  AST 9* 13*  ALT 5* 4*  ALKPHOS 70 70   No results for input(s): LIPASE, AMYLASE in the last 8760 hours. No results for input(s): AMMONIA in the last 8760 hours. CBC:  Recent Labs  04/12/16 07/22/16 1626  10/08/16 1211 10/15/16 11/10/16 0855 11/10/16 1924 11/11/16 0406  WBC 8.0 17.2*  < > 17.3* 11.6  --  12.0* 9.6  NEUTROABS 3 10.3*  --   --   --   --   --   --   HGB 10.3* 10.6*  < > 11.4* 9.8* 11.6* 10.7* 9.4*  HCT 34* 33.8*  < > 36.2* 32* 34.0* 34.9* 31.1*  MCV  --  72.7*  < > 76.7*  --   --  75.1* 75.1*  PLT 321 293  < > 389 304   --  246 227  < > = values in this interval not displayed. Cardiac Enzymes: No results for input(s): CKTOTAL, CKMB, CKMBINDEX, TROPONINI in the last 8760 hours. BNP: Invalid input(s): POCBNP CBG:  Recent Labs  12/05/15 1414 10/11/16 1102  GLUCAP 84 80    Radiological Exams: No results found.    Assessment/Plan  Unsteady gait Will have patient work with PT/OT as tolerated to regain strength and restore function.  Fall precautions are in place.  Critical limb ischemia Severe with his PAD, recently placed stent was occluded and he underwent angiogram, artherectomy and angioplasty with stent placement. Pain has resolved at present. Monitor clinically. Will need f/u with vascular surgery. Continue daily plavix and NTG patch. Will need ABI at follow up.   Left TMA Will need dressing change and wound care, orthopedic follow up. PMR consult. 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 Monitor clinically. Fall precautions   Goals of care: long term care   Labs/tests ordered: cbc, cmp 11/17/16  Family/ staff Communication: reviewed care plan with patient and nursing supervisor    Oneal Grout, MD Internal Medicine [facility] [address] [place], [place] [address] Cell Phone (Monday-Friday 8 am - 5 pm): [phone] On Call: [phone] and follow prompts after 5 pm and on weekends Office Phone: [phone] Office Fax: [phone]

## 2016-11-16 ENCOUNTER — Telehealth: Payer: Self-pay | Admitting: Vascular Surgery

## 2016-11-16 NOTE — Telephone Encounter (Signed)
Checked the schedules and there were no availabilities for an earlier appt. Lm with rosemary at [facility] to inform them.

## 2016-11-16 NOTE — Telephone Encounter (Signed)
-----   Message from Phillips Odor, RN sent at 11/16/2016 12:42 PM EDT ----- Regarding: request to move the appt. to earlier time frame Contact: [phone] Please look at the option of moving his f/u appt. and vasc. studies to an earlier time frame.  s/p Atherectomy/ Balloon angioplasty, and Stent Left LE vessels 4/25.  Per staff message the initial f/u was to be in 2-3 weeks.  The pt. and the facility are asking about getting the appt. sooner.  Please call Coy Saunas at [facility] @ [phone] ext. [phone].

## 2016-11-17 LAB — BASIC METABOLIC PANEL
BUN: 12 mg/dL (ref 4–21)
CREATININE: 1 mg/dL (ref 0.6–1.3)
Glucose: 101 mg/dL
Potassium: 4.4 mmol/L (ref 3.4–5.3)
SODIUM: 141 mmol/L (ref 137–147)

## 2016-11-17 LAB — HEPATIC FUNCTION PANEL
ALT: 4 U/L — AB (ref 10–40)
AST: 12 U/L — AB (ref 14–40)
Alkaline Phosphatase: 58 U/L (ref 25–125)
BILIRUBIN, TOTAL: 0.5 mg/dL

## 2016-11-17 LAB — CBC AND DIFFERENTIAL
HEMATOCRIT: 32 % — AB (ref 41–53)
Hemoglobin: 9.5 g/dL — AB (ref 13.5–17.5)
Platelets: 264 10*3/uL (ref 150–399)
WBC: 14 10^3/mL

## 2016-11-18 ENCOUNTER — Ambulatory Visit (INDEPENDENT_AMBULATORY_CARE_PROVIDER_SITE_OTHER): Payer: Medicare HMO | Admitting: Orthopedic Surgery

## 2016-11-18 ENCOUNTER — Other Ambulatory Visit (INDEPENDENT_AMBULATORY_CARE_PROVIDER_SITE_OTHER): Payer: Self-pay | Admitting: Orthopedic Surgery

## 2016-11-18 ENCOUNTER — Encounter (HOSPITAL_COMMUNITY): Payer: Self-pay | Admitting: *Deleted

## 2016-11-18 ENCOUNTER — Encounter (INDEPENDENT_AMBULATORY_CARE_PROVIDER_SITE_OTHER): Payer: Self-pay | Admitting: Orthopedic Surgery

## 2016-11-18 VITALS — Ht 76.0 in | Wt 147.0 lb

## 2016-11-18 DIAGNOSIS — Z89432 Acquired absence of left foot: Secondary | ICD-10-CM

## 2016-11-18 DIAGNOSIS — I96 Gangrene, not elsewhere classified: Secondary | ICD-10-CM

## 2016-11-18 DIAGNOSIS — I998 Other disorder of circulatory system: Secondary | ICD-10-CM

## 2016-11-18 DIAGNOSIS — I70229 Atherosclerosis of native arteries of extremities with rest pain, unspecified extremity: Secondary | ICD-10-CM

## 2016-11-18 NOTE — Pre-Procedure Instructions (Signed)
    Caleb DuboisJerald J Hays  11/18/2016     Your procedure is scheduled on Friday, May 4.  [facility] at 12:40 PM                  Scheduled for 3:10 PM    Remember:  Do not eat food or drink liquids after midnight.  Take these medicines the morning of surgery with A SIP OF WATER : Allopurinol, Aspirin, DULoxetine (CYMBALTA), famotidine (PEPCID), levothyroxine (SYNTHROID, LEVOTHROID), methocarbamol (ROBAXIN), omeprazole (PRILOSEC).                     May use Albuterol Inhaler.  May take Oxycodone if he tolerates it on an empty stomach.                Please send the Medication Record with Medication administered documented.                    Any questions on Friday, call the pre- op desk at [phone]

## 2016-11-18 NOTE — Pre-Procedure Instructions (Signed)
    Caleb DuboisJerald J Hays  11/18/2016     Your procedure is scheduled on Friday, May 4.  [facility] Ad[facility] at 12:40 PM                  Scheduled for 3:10 PM    Remember:  Do not eat food or drink liquids after midnight.  Take these medicines the morning of surgery with A SIP OF WATER : Allopurinol, Aspirin, Duloxetine, Pepcid, Levothyroxine, Methocarbamol,  Omeprazole.  May use Albuterol Inhaler.              May take if needed and he tolerates it on an empty stomach: oxyCODONE . Marland Kitchen.  Day of Surgery please send Medication Record with Medications that were administered documented.                   For any questions day of Surgery call the pre- op desk at [phone]

## 2016-11-18 NOTE — Progress Notes (Signed)
Office Visit Note   Patient: Caleb Hays           Date of Birth: 08/03/1940           MRN: [id number] Visit Date: 11/18/2016              Requested by: Oneal GroutMahima Pandey, MD [address] [place], [place] [address] PCP: [name], [name], MD  Chief Complaint  Patient presents with  . Left Foot - Routine Post Op    10/08/16 left foot transmet amputation.       HPI: Patient is status post angioplasty for critical limb ischemia for the left lower extremity he has had massive dehiscence of the transmetatarsal amputation.  Assessment & Plan: Visit Diagnoses:  1. Gangrene of left foot (HCC)   2. Critical lower limb ischemia   3. S/P transmetatarsal amputation of foot, left (HCC)     Plan: Patient does not want to consider the option of a higher level amputation which would be more successful. He wants to proceed with a revision of the transtibial amputation. Discussed the increasing risk of nonhealing of the amputation. With his recent revascularization Allena KatzOfelia is a chance for this to provide enough circulation for revision transmetatarsal amputation to heal. We'll plan for surgery tomorrow and discharge back to skilled nursing with a Prevena wound VAC.  Follow-Up Instructions: Return in about 1 week (around 11/25/2016).   Ortho Exam  Patient is alert, oriented, no adenopathy, well-dressed, normal affect, normal respiratory effort. Examination patient's foot is warm he has massive dehiscence of the transmetatarsal amputation. There is no ascending cellulitis.  Imaging: No results found.  Labs: Lab Results  Component Value Date   HGBA1C 5.4 07/23/2016   HGBA1C 5.3 02/09/2016   HGBA1C 4.4 12/30/2015   ESRSEDRATE 25 (H) 07/22/2016   ESRSEDRATE 11 04/12/2016   ESRSEDRATE 61 (H) 08/13/2015   CRP 1.7 (H) 07/22/2016   CRP 9.6 (H) 08/13/2015   CRP 19.5 (H) 08/10/2015   LABURIC 6.6 07/23/2016   LABURIC 7.3 08/13/2015   LABURIC 9.4 (H) 08/09/2015   REPTSTATUS 07/25/2016 FINAL  07/24/2016   GRAMSTAIN  08/13/2015    RARE WBC PRESENT, PREDOMINANTLY PMN NO ORGANISMS SEEN Performed at [facility]    GRAMSTAIN  08/13/2015    RARE WBC PRESENT, PREDOMINANTLY PMN NO ORGANISMS SEEN Performed at [facility]    CULT NO GROWTH 07/24/2016   LABORGA STAPHYLOCOCCUS AUREUS 08/13/2015    Orders:  No orders of the defined types were placed in this encounter.  No orders of the defined types were placed in this encounter.    Procedures: No procedures performed  Clinical Data: No additional findings.  ROS:  All other systems negative, except as noted in the HPI. Review of Systems  Objective: Vital Signs: Ht 6\' 4"  (1.93 m)   Wt 147 lb (66.7 kg)   BMI 17.89 kg/m   Specialty Comments:  No specialty comments available.  PMFS History: Patient Active Problem List   Diagnosis Date Noted  . S/P transmetatarsal amputation of foot, left (HCC) 10/08/2016  . PAD (peripheral artery disease) (HCC) 09/15/2016  . Gangrene of left foot (HCC)   . Osteomyelitis (HCC) 07/22/2016  . Edema 05/05/2016  . Phantom pain (HCC) 03/08/2016  . Gout 03/08/2016  . Hypothyroidism (acquired) 03/08/2016  . CKD (chronic kidney disease) stage 3, GFR 30-59 ml/min 03/08/2016  . GERD without esophagitis 03/08/2016  . Constipation 12/29/2015  . Below knee amputation status (HCC) 10/01/2015  . Ankle pain  08/13/2015  . Adult failure to thrive   . PVD (peripheral vascular disease) (HCC)   . Elevated blood uric acid level 08/10/2015  . 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 Anemia of chronic disease 08/10/2015  . Malnutrition of moderate degree 06/24/2015  . Failure to thrive in adult 06/23/2015  . Weight loss 06/23/2015  . Tobacco abuse 06/23/2015  . COPD (chronic obstructive pulmonary disease) (HCC) 06/23/2015  . Fall   . Critical lower limb ischemia 05/31/2014  . Status post right foot surgery 03/12/2014   Past Medical History:  Diagnosis Date  . Adult failure  to thrive    /notes 06/23/2015  . Anemia   . Arthritis    "hands, fingers" (11/10/2016  . COPD (chronic obstructive pulmonary disease) (HCC)    LONG TIME SMOKER  . Critical lower limb ischemia   . 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 Depression    "periods of depression" (06/23/2015)  . Gout   . Hypothyroidism   . PAD (peripheral artery disease) (HCC)   . Physical deconditioning   . Pyelonephritis 06/23/2015  . Status post foot surgery    right fifth toe amputation by Dr. Lajoyce Corners     Family History  Problem Relation Age of Onset  . Heart failure Father   . Heart attack Father   . Heart failure Brother     Past Surgical History:  Procedure Laterality Date  . ABDOMINAL AORTOGRAM W/LOWER EXTREMITY N/A 09/03/2016   Procedure: Abdominal Aortogram w/Lower Extremity;  Surgeon: Sherren Kerns, MD;  Location: [facility] INVASIVE CV LAB;  Service: Cardiovascular;  Laterality: N/A;  Lt. leg  . ABDOMINAL AORTOGRAM W/LOWER EXTREMITY N/A 11/10/2016   Procedure: Abdominal Aortogram w/Lower Extremity;  Surgeon: Maeola Harman, MD;  Location: [facility] INVASIVE CV LAB;  Service: Cardiovascular;  Laterality: N/A;  . AMPUTATION Right 03/01/2014   Procedure: AMPUTATION RAY;  Surgeon: Nadara Mustard, MD;  Location: [facility] OR;  Service: Orthopedics;  Laterality: Right;  Right Foot 5th Ray Amputation  . AMPUTATION Right 04/26/2014   Procedure: Right Foot 4th Ray Amputation;  Surgeon: Nadara Mustard, MD;  Location: [facility] OR;  Service: Orthopedics;  Laterality: Right;  . AMPUTATION Right 10/01/2015   Procedure: Right Below Knee Amputation;  Surgeon: Nadara Mustard, MD;  Location: [facility] OR;  Service: Orthopedics;  Laterality: Right;  . AMPUTATION Left 07/23/2016   Procedure: Left 2nd Toe Amputation;  Surgeon: Nadara Mustard, MD;  Location: [facility] OR;  Service: Orthopedics;  Laterality: Left;  . AMPUTATION Left 10/08/2016   Procedure: Left Transmetatarsal Amputation;  Surgeon: Nadara Mustard, MD;   Location: [facility] OR;  Service: Orthopedics;  Laterality: Left;  . ANKLE ARTHROSCOPY Right 08/13/2015   Procedure: ANKLE ARTHROSCOPY;  Surgeon: Nadara Mustard, MD;  Location: [facility] OR;  Service: Orthopedics;  Laterality: Right;  . CATARACT EXTRACTION W/ INTRAOCULAR LENS  IMPLANT, BILATERAL Bilateral   . INGUINAL HERNIA REPAIR Left   . KNEE CARTILAGE SURGERY Left 1960's   football injury  . KNEE LIGAMENT RECONSTRUCTION Left 1960's  . LOWER EXTREMITY ANGIOGRAM N/A 05/27/2014   Procedure: LOWER EXTREMITY ANGIOGRAM;  Surgeon: Runell Gess, MD;  Location: [facility] CATH LAB;  Service: Cardiovascular;  Laterality: N/A;  . LOWER EXTREMITY ANGIOGRAPHY Left 09/15/2016   Procedure: Lower Extremity Angiography;  Surgeon: Maeola Harman, MD;  Location: [facility] INVASIVE CV LAB;  Service: Cardiovascular;  Laterality: Left;  . PERIPHERAL VASCULAR ATHERECTOMY  11/10/2016   Procedure: Peripheral  Vascular Atherectomy;  Surgeon: Maeola Harman, MD;  Location: [facility] INVASIVE CV LAB;  Service: Cardiovascular;;  . PERIPHERAL VASCULAR INTERVENTION Left 09/15/2016   Procedure: Peripheral Vascular Intervention;  Surgeon: Maeola Harman, MD;  Location: [facility] INVASIVE CV LAB;  Service: Cardiovascular;  Laterality: Left;  SFA/popiteal  . PERIPHERAL VASCULAR INTERVENTION  11/10/2016   Hattie Perch 11/10/2016  . PERIPHERAL VASCULAR INTERVENTION  11/10/2016   Procedure: Peripheral Vascular Intervention;  Surgeon: Maeola Harman, MD;  Location: [facility] INVASIVE CV LAB;  Service: Cardiovascular;;  . REFRACTIVE SURGERY Bilateral   . STUMP REVISION Right 12/05/2015   Procedure: Revision Right Below Knee Amputation;  Surgeon: Nadara Mustard, MD;  Location: [facility] OR;  Service: Orthopedics;  Laterality: Right;   Social History   Occupational History  . Not on file.   Social History Main Topics  . Smoking status: Former Smoker    Packs/day: 0.50    Years: 57.00    Types: Cigarettes    Quit date: 03/19/2016  . Smokeless tobacco:  Never Used  . Alcohol use Yes     Comment: 11/10/2016 I was nking alot of beer; quit in 2015"   . Drug use: No  . Sexual activity: No

## 2016-11-19 ENCOUNTER — Encounter (HOSPITAL_COMMUNITY)
Admission: RE | Disposition: A | Discharge status: Skilled Nursing Facility | Payer: Self-pay | Source: Ambulatory Visit | Attending: Orthopedic Surgery

## 2016-11-19 ENCOUNTER — Ambulatory Visit (HOSPITAL_COMMUNITY): Payer: Medicare HMO | Admitting: Anesthesiology

## 2016-11-19 ENCOUNTER — Ambulatory Visit (HOSPITAL_COMMUNITY)
Admission: RE | Admit: 2016-11-19 | Discharge: 2016-11-19 | Disposition: A | Discharge status: Skilled Nursing Facility | Payer: Medicare HMO | Source: Ambulatory Visit | Attending: Orthopedic Surgery | Admitting: Orthopedic Surgery

## 2016-11-19 ENCOUNTER — Encounter (HOSPITAL_COMMUNITY): Payer: Self-pay | Admitting: *Deleted

## 2016-11-19 DIAGNOSIS — Z89511 Acquired absence of right leg below knee: Secondary | ICD-10-CM | POA: Insufficient documentation

## 2016-11-19 DIAGNOSIS — I96 Gangrene, not elsewhere classified: Secondary | ICD-10-CM | POA: Diagnosis present

## 2016-11-19 DIAGNOSIS — Z89422 Acquired absence of other left toe(s): Secondary | ICD-10-CM | POA: Diagnosis not present

## 2016-11-19 DIAGNOSIS — J449 Chronic obstructive pulmonary disease, unspecified: Secondary | ICD-10-CM | POA: Insufficient documentation

## 2016-11-19 DIAGNOSIS — E039 Hypothyroidism, unspecified: Secondary | ICD-10-CM | POA: Insufficient documentation

## 2016-11-19 DIAGNOSIS — Z87891 Personal history of nicotine dependence: Secondary | ICD-10-CM | POA: Insufficient documentation

## 2016-11-19 DIAGNOSIS — T8781 Dehiscence of amputation stump: Secondary | ICD-10-CM | POA: Diagnosis not present

## 2016-11-19 DIAGNOSIS — K219 Gastro-esophageal reflux disease without esophagitis: Secondary | ICD-10-CM | POA: Diagnosis not present

## 2016-11-19 DIAGNOSIS — Y835 Amputation of limb(s) as the cause of abnormal reaction of the patient, or of later complication, without mention of misadventure at the time of the procedure: Secondary | ICD-10-CM | POA: Insufficient documentation

## 2016-11-19 DIAGNOSIS — F329 Major depressive disorder, single episode, unspecified: Secondary | ICD-10-CM | POA: Diagnosis not present

## 2016-11-19 DIAGNOSIS — M109 Gout, unspecified: Secondary | ICD-10-CM | POA: Insufficient documentation

## 2016-11-19 DIAGNOSIS — Z79899 Other long term (current) drug therapy: Secondary | ICD-10-CM | POA: Insufficient documentation

## 2016-11-19 HISTORY — PX: STUMP REVISION: SHX6102

## 2016-11-19 SURGERY — REVISION, AMPUTATION SITE
Anesthesia: General | Site: Foot | Laterality: Left

## 2016-11-19 MED ORDER — CEFAZOLIN SODIUM 1 G IJ SOLR
INTRAMUSCULAR | Status: DC | PRN
  Administered 2016-11-19: 2 g via INTRAMUSCULAR

## 2016-11-19 MED ORDER — LIDOCAINE 2% (20 MG/ML) 5 ML SYRINGE
INTRAMUSCULAR | Status: AC
  Filled 2016-11-19: qty 5

## 2016-11-19 MED ORDER — HYDROMORPHONE HCL 1 MG/ML IJ SOLN: 0.2500 mg | INTRAMUSCULAR | Status: DC | PRN

## 2016-11-19 MED ORDER — HYDROMORPHONE HCL 1 MG/ML IJ SOLN
0.2500 mg | INTRAMUSCULAR | Status: DC | PRN
  Administered 2016-11-19 (×2): 0.5 mg via INTRAVENOUS

## 2016-11-19 MED ORDER — HYDROMORPHONE HCL 1 MG/ML IJ SOLN
INTRAMUSCULAR | Status: AC
  Filled 2016-11-19: qty 1

## 2016-11-19 MED ORDER — FENTANYL CITRATE (PF) 100 MCG/2ML IJ SOLN
INTRAMUSCULAR | Status: DC | PRN
  Administered 2016-11-19: 50 ug via INTRAVENOUS
  Administered 2016-11-19: 100 ug via INTRAVENOUS

## 2016-11-19 MED ORDER — CHLORHEXIDINE GLUCONATE 4 % EX LIQD: 60.0000 mL | Freq: Once | CUTANEOUS | Status: DC

## 2016-11-19 MED ORDER — FENTANYL CITRATE (PF) 250 MCG/5ML IJ SOLN
INTRAMUSCULAR | Status: AC
  Filled 2016-11-19: qty 5

## 2016-11-19 MED ORDER — PROPOFOL 10 MG/ML IV BOLUS
INTRAVENOUS | Status: AC
  Filled 2016-11-19: qty 20

## 2016-11-19 MED ORDER — CEFAZOLIN SODIUM 1 G IJ SOLR
INTRAMUSCULAR | Status: AC
  Filled 2016-11-19: qty 20

## 2016-11-19 MED ORDER — ACETAMINOPHEN 325 MG PO TABS: 650.0000 mg | ORAL_TABLET | Freq: Once | ORAL | Status: DC

## 2016-11-19 MED ORDER — 0.9 % SODIUM CHLORIDE (POUR BTL) OPTIME
TOPICAL | Status: DC | PRN
  Administered 2016-11-19: 1000 mL

## 2016-11-19 MED ORDER — OXYCODONE HCL 5 MG PO TABS
5.0000 mg | ORAL_TABLET | Freq: Once | ORAL | Status: AC
  Administered 2016-11-19: 5 mg via ORAL

## 2016-11-19 MED ORDER — OXYCODONE HCL 5 MG PO TABS
ORAL_TABLET | ORAL | Status: AC
  Administered 2016-11-19: 5 mg via ORAL
  Filled 2016-11-19: qty 1

## 2016-11-19 MED ORDER — ONDANSETRON HCL 4 MG/2ML IJ SOLN: 4.0000 mg | Freq: Once | INTRAMUSCULAR | Status: DC | PRN

## 2016-11-19 MED ORDER — CEFAZOLIN SODIUM-DEXTROSE 2-4 GM/100ML-% IV SOLN: 2.0000 g | INTRAVENOUS | Status: DC

## 2016-11-19 MED ORDER — ONDANSETRON HCL 4 MG/2ML IJ SOLN
INTRAMUSCULAR | Status: DC | PRN
  Administered 2016-11-19: 4 mg via INTRAVENOUS

## 2016-11-19 MED ORDER — ACETAMINOPHEN 325 MG PO TABS
ORAL_TABLET | ORAL | Status: AC
  Administered 2016-11-19: 650 mg
  Filled 2016-11-19: qty 2

## 2016-11-19 MED ORDER — PROPOFOL 10 MG/ML IV BOLUS
INTRAVENOUS | Status: DC | PRN
  Administered 2016-11-19: 200 mg via INTRAVENOUS

## 2016-11-19 MED ORDER — LACTATED RINGERS IV SOLN
INTRAVENOUS | Status: DC
  Administered 2016-11-19: 13:00:00 via INTRAVENOUS

## 2016-11-19 MED ORDER — MEPERIDINE HCL 25 MG/ML IJ SOLN: 6.2500 mg | INTRAMUSCULAR | Status: DC | PRN

## 2016-11-19 MED ORDER — LIDOCAINE HCL (CARDIAC) 20 MG/ML IV SOLN
INTRAVENOUS | Status: DC | PRN
  Administered 2016-11-19: 100 mg via INTRAVENOUS

## 2016-11-19 MED ORDER — OXYCODONE HCL 5 MG PO TABS
ORAL_TABLET | ORAL | Status: AC
  Administered 2016-11-19: 5 mg
  Filled 2016-11-19: qty 1

## 2016-11-19 SURGICAL SUPPLY — 31 items
BENZOIN TINCTURE PRP APPL 2/3 (GAUZE/BANDAGES/DRESSINGS) ×9 IMPLANT
BLADE SAW RECIP 87.9 MT (BLADE) IMPLANT
BLADE SURG 21 STRL SS (BLADE) ×3 IMPLANT
BNDG COHESIVE 6X5 TAN STRL LF (GAUZE/BANDAGES/DRESSINGS) ×3 IMPLANT
COVER SURGICAL LIGHT HANDLE (MISCELLANEOUS) ×3 IMPLANT
DRAPE EXTREMITY T 121X128X90 (DRAPE) ×3 IMPLANT
DRAPE HALF SHEET 40X57 (DRAPES) ×3 IMPLANT
DRAPE INCISE IOBAN 66X45 STRL (DRAPES) ×9 IMPLANT
DRAPE U-SHAPE 47X51 STRL (DRAPES) ×6 IMPLANT
DRSG VAC ATS MED SENSATRAC (GAUZE/BANDAGES/DRESSINGS) ×3 IMPLANT
DURAPREP 26ML APPLICATOR (WOUND CARE) ×3 IMPLANT
ELECT REM PT RETURN 9FT ADLT (ELECTROSURGICAL) ×3
ELECTRODE REM PT RTRN 9FT ADLT (ELECTROSURGICAL) ×1 IMPLANT
GLOVE BIOGEL PI IND STRL 9 (GLOVE) ×1 IMPLANT
GLOVE BIOGEL PI INDICATOR 9 (GLOVE) ×2
GLOVE SURG ORTHO 9.0 STRL STRW (GLOVE) ×3 IMPLANT
GOWN STRL REUS W/ TWL XL LVL3 (GOWN DISPOSABLE) ×2 IMPLANT
GOWN STRL REUS W/TWL XL LVL3 (GOWN DISPOSABLE) ×4
KIT BASIN OR (CUSTOM PROCEDURE TRAY) ×3 IMPLANT
KIT ROOM TURNOVER OR (KITS) ×3 IMPLANT
MANIFOLD NEPTUNE II (INSTRUMENTS) ×3 IMPLANT
NS IRRIG 1000ML POUR BTL (IV SOLUTION) ×3 IMPLANT
PACK GENERAL/GYN (CUSTOM PROCEDURE TRAY) ×3 IMPLANT
PAD ARMBOARD 7.5X6 YLW CONV (MISCELLANEOUS) ×3 IMPLANT
PAD NEG PRESSURE SENSATRAC (MISCELLANEOUS) IMPLANT
PREVENA INCISION MGT 90 150 (MISCELLANEOUS) ×3 IMPLANT
STAPLER VISISTAT 35W (STAPLE) IMPLANT
SUT ETHILON 2 0 PSLX (SUTURE) ×6 IMPLANT
SUT SILK 2 0 (SUTURE)
SUT SILK 2-0 18XBRD TIE 12 (SUTURE) IMPLANT
TOWEL OR 17X26 10 PK STRL BLUE (TOWEL DISPOSABLE) ×3 IMPLANT

## 2016-11-19 NOTE — Progress Notes (Signed)
[facility] for last medication doses with no answer will try again

## 2016-11-19 NOTE — Anesthesia Preprocedure Evaluation (Signed)
Anesthesia Evaluation  Patient identified by MRN, date of birth, ID band Patient awake    Reviewed: Allergy & Precautions, NPO status , Patient's Chart, lab work & pertinent test results  Airway Mallampati: I  TM Distance: >3 FB Neck ROM: Full    Dental   Pulmonary COPD, former smoker,    Pulmonary exam normal        Cardiovascular Normal cardiovascular exam     Neuro/Psych    GI/Hepatic GERD  Medicated and Controlled,  Endo/Other    Renal/GU Renal InsufficiencyRenal disease     Musculoskeletal   Abdominal   Peds  Hematology   Anesthesia Other Findings   Reproductive/Obstetrics                             Anesthesia Physical Anesthesia Plan  ASA: III  Anesthesia Plan: General   Post-op Pain Management:    Induction: Intravenous  Airway Management Planned: LMA  Additional Equipment:   Intra-op Plan:   Post-operative Plan: Extubation in OR  Informed Consent: I have reviewed the patients History and Physical, chart, labs and discussed the procedure including the risks, benefits and alternatives for the proposed anesthesia with the patient or authorized representative who has indicated his/her understanding and acceptance.     Plan Discussed with: CRNA and Surgeon  Anesthesia Plan Comments:         Anesthesia Quick Evaluation

## 2016-11-19 NOTE — Op Note (Signed)
11/19/2016  3:54 PM  PATIENT:  Barton DuboisJerald J Weedon    PRE-OPERATIVE DIAGNOSIS:  Gangrene Left Transmetatarsal   POST-OPERATIVE DIAGNOSIS:  Same  PROCEDURE:  Revision Left Transmetatarsal Amputation and application of Prevena wound VAC  SURGEON:  Nadara MustardMarcus V Duda, MD  PHYSICIAN ASSISTANT:None ANESTHESIA:   General  PREOPERATIVE INDICATIONS:  Ashley MarinerJerald J Cardin is a  76 y.o. male with a diagnosis of Gangrene Left Transmetatarsal  who failed conservative measures and elected for surgical management.    The risks benefits and alternatives were discussed with the patient preoperatively including but not limited to the risks of infection, bleeding, nerve injury, cardiopulmonary complications, the need for revision surgery, among others, and the patient was willing to proceed.  OPERATIVE IMPLANTS: Prevena wound VAC  OPERATIVE FINDINGS: Calcified vessels  OPERATIVE PROCEDURE: Patient was brought to the operating room and underwent a general anesthetic. After adequate levels of anesthesia were obtained patient's left lower extremity was prepped using DuraPrep draped into a sterile field a timeout was called. A fishmouth incision was made around the dehisced necrotic transmetatarsal amputation wound. Approximately 3 cm of the metatarsal shaft was resected with a reciprocating saw. Electrocautery was used for hemostasis. The wound was irrigated with normal saline. The incision was closed using 2-0 nylon. A Prevena wound VAC was applied in a compression wrap from the foot to the tibial tubercle was applied due to patient's massive venous stasis edema.  Plan to discharge back to skilled nursing no change in his medications.

## 2016-11-19 NOTE — Progress Notes (Signed)
D; pt was asking when can he go to his place, Called [facility] sais theny will be here as possible as they can, cannot give the time. 6 infront of the pt.  Informed to pt. Offered warm blanket. Pt is watching TV now.

## 2016-11-19 NOTE — H&P (Signed)
Caleb Hays is an 76 y.o. male.   Chief Complaint: Dehiscence left transmetatarsal amputation HPI: Patient is a 76 year old gentleman with severe peripheral vascular disease who is status post angioplasty for revascularization to the left lower extremity he has massive dehiscence of the transmetatarsal amputation. Patient wishes to continue with foot salvage intervention and presents for revision of the transmetatarsal amputation.  Past Medical History:  Diagnosis Date  . Adult failure to thrive    /notes 06/23/2015  . Anemia   . Arthritis    "hands, fingers" (11/10/2016  . COPD (chronic obstructive pulmonary disease) (HCC)    LONG TIME SMOKER  . Critical lower limb ischemia   . 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 Depression    "periods of depression" (06/23/2015)  . Gout   . Hypothyroidism   . PAD (peripheral artery disease) (HCC)   . Physical deconditioning   . Pyelonephritis 06/23/2015  . Status post foot surgery    right fifth toe amputation by Dr. Lajoyce Corners     Past Surgical History:  Procedure Laterality Date  . ABDOMINAL AORTOGRAM W/LOWER EXTREMITY N/A 09/03/2016   Procedure: Abdominal Aortogram w/Lower Extremity;  Surgeon: Sherren Kerns, MD;  Location: [facility] INVASIVE CV LAB;  Service: Cardiovascular;  Laterality: N/A;  Lt. leg  . ABDOMINAL AORTOGRAM W/LOWER EXTREMITY N/A 11/10/2016   Procedure: Abdominal Aortogram w/Lower Extremity;  Surgeon: Maeola Harman, MD;  Location[facility] INVASIVE CV LAB;  Service: Cardiovascular;  Laterality: N/A;  . AMPUTATION Right 03/01/2014   Procedure: AMPUTATION RAY;  Surgeon: Nadara Mustard, MD;  Location: [facility] OR;  Service: Orthopedics;  Laterality: Right;  Right Foot 5th Ray Amputation  . AMPUTATION Right 04/26/2014   Procedure: Right Foot 4th Ray Amputation;  Surgeon: Nadara Mustard, MD;  Location: [facility] OR;  Service: Orthopedics;  Laterality: Right;  . AMPUTATION Right 10/01/2015   Procedure: Right Below Knee  Amputation;  Surgeon: Nadara Mustard, MD;  Location: [facility] OR;  Service: Orthopedics;  Laterality: Right;  . AMPUTATION Left 07/23/2016   Procedure: Left 2nd Toe Amputation;  Surgeon: Nadara Mustard, MD;  Location: [facility] OR;  Service: Orthopedics;  Laterality: Left;  . AMPUTATION Left 10/08/2016   Procedure: Left Transmetatarsal Amputation;  Surgeon: Nadara Mustard, MD;  Location: [facility] OR;  Service: Orthopedics;  Laterality: Left;  . ANKLE ARTHROSCOPY Right 08/13/2015   Procedure: ANKLE ARTHROSCOPY;  Surgeon: Nadara Mustard, MD;  Location: [facility] OR;  Service: Orthopedics;  Laterality: Right;  . CATARACT EXTRACTION W/ INTRAOCULAR LENS  IMPLANT, BILATERAL Bilateral   . INGUINAL HERNIA REPAIR Left   . KNEE CARTILAGE SURGERY Left 1960's   football injury  . KNEE LIGAMENT RECONSTRUCTION Left 1960's  . LOWER EXTREMITY ANGIOGRAM N/A 05/27/2014   Procedure: LOWER EXTREMITY ANGIOGRAM;  Surgeon: Runell Gess, MD;  Location: [facility] CATH LAB;  Service: Cardiovascular;  Laterality: N/A;  . LOWER EXTREMITY ANGIOGRAPHY Left 09/15/2016   Procedure: Lower Extremity Angiography;  Surgeon: Maeola Harman, MD;  Location: [facility] INVASIVE CV LAB;  Service: Cardiovascular;  Laterality: Left;  . PERIPHERAL VASCULAR ATHERECTOMY  11/10/2016   Procedure: Peripheral Vascular Atherectomy;  Surgeon: Maeola Harman, MD;  Location: [facility] INVASIVE CV LAB;  Service: Cardiovascular;;  . PERIPHERAL VASCULAR INTERVENTION Left 09/15/2016   Procedure: Peripheral Vascular Intervention;  Surgeon: Maeola Harman, MD;  Location: [facility] INVASIVE CV LAB;  Service: Cardiovascular;  Laterality: Left;  SFA/popiteal  . PERIPHERAL VASCULAR INTERVENTION  11/10/2016   /  notes 11/10/2016  . PERIPHERAL VASCULAR INTERVENTION  11/10/2016   Procedure: Peripheral Vascular Intervention;  Surgeon: Maeola HarmanBrandon Christopher Cain, MD;  Location[facility] INVASIVE CV LAB;  Service: Cardiovascular;;  . REFRACTIVE SURGERY Bilateral   . STUMP REVISION Right 12/05/2015    Procedure: Revision Right Below Knee Amputation;  Surgeon: Nadara MustardMarcus Duda V, MD;  [facility];  Service: Orthopedics;  Laterality: Right;    Family History  Problem Relation Age of Onset  . Heart failure Father   . Heart attack Father   . Heart failure Brother    Social History:  reports that he quit smoking about 8 months ago. His smoking use included Cigarettes. He has a 28.50 pack-year smoking history. He has never used smokeless tobacco. He reports that he drinks alcohol. He reports that he does not use drugs.  Allergies: No Known Allergies  No prescriptions prior to admission.    No results found for this or any previous visit (from the past 48 hour(s)). No results found.  Review of Systems  All other systems reviewed and are negative.   There were no vitals taken for this visit. Physical Exam   On examination patient is alert oriented no adenopathy well-dressed normal affect almost wear for he has an antalgic gait. Examination is a palpable dorsalis pedis pulse good capillary refill he has massive dehiscence of the transmetatarsal amputation with necrotic tissue and exposed bone at the base. Assessment/Plan Assessment: Peripheral vascular disease status post revascularization to the left lower extremity with massive dehiscence of the transmetatarsal amputation.  Plan: As per the patient's request we will plan for revision of the transmetatarsal amputation. Risks and benefits were discussed including nonhealing of the wound need for higher level amputation. Patient states he understands and wishes to proceed.  Nadara MustardMarcus V Duda, MD 11/19/2016, 6:47 AM

## 2016-11-19 NOTE — Anesthesia Procedure Notes (Signed)
Procedure Name: LMA Insertion Date/Time: 11/19/2016 3:26 PM Performed by: Rosiland OzMEYERS, Vickey Ewbank Pre-anesthesia Checklist: Patient identified, Emergency Drugs available, Suction available, Patient being monitored and Timeout performed Patient Re-evaluated:Patient Re-evaluated prior to inductionOxygen Delivery Method: Circle system utilized Preoxygenation: Pre-oxygenation with 100% oxygen Intubation Type: IV induction LMA: LMA inserted LMA Size: 5.0 Number of attempts: 1 Placement Confirmation: breath sounds checked- equal and bilateral and positive ETCO2 Tube secured with: Tape Dental Injury: Teeth and Oropharynx as per pre-operative assessment

## 2016-11-19 NOTE — Progress Notes (Signed)
D; [facility] came to pick pt up, pt c/o pain med. Called DR. Rose, get order for 5mg  Oxy IR now  Received.

## 2016-11-19 NOTE — Transfer of Care (Signed)
Immediate Anesthesia Transfer of Care Note  Patient: Barton DuboisJerald J Flett  Procedure(s) Performed: Procedure(s): Revision Left Transmetatarsal Amputation (Left)  Patient Location: PACU  Anesthesia Type:General  Level of Consciousness: awake and patient cooperative  Airway & Oxygen Therapy: Patient Spontanous Breathing  Post-op Assessment: Report given to RN and Post -op Vital signs reviewed and stable  Post vital signs: Reviewed and stable  Last Vitals:  Vitals:   11/19/16 1227  BP: (!) 147/68  Pulse: 72  Resp: 18  Temp: 36.6 C    Last Pain:  Vitals:   11/19/16 1227  TempSrc: Oral      Patients Stated Pain Goal: 2 (11/19/16 1235)  Complications: No apparent anesthesia complications

## 2016-11-19 NOTE — Anesthesia Postprocedure Evaluation (Signed)
Anesthesia Post Note  Patient: Caleb Hays  Procedure(s) Performed: Procedure(s) (LRB): Revision Left Transmetatarsal Amputation (Left)  Patient location during evaluation: PACU Anesthesia Type: General Level of consciousness: awake and alert Pain management: pain level controlled Vital Signs Assessment: post-procedure vital signs reviewed and stable Respiratory status: spontaneous breathing, nonlabored ventilation, respiratory function stable and patient connected to nasal cannula oxygen Cardiovascular status: blood pressure returned to baseline and stable Postop Assessment: no signs of nausea or vomiting Anesthetic complications: no       Last Vitals:  Vitals:   11/19/16 1555 11/19/16 1615  BP: 132/80 (!) 124/100  Pulse: (!) 59 (!) 57  Resp: 13 18  Temp: 36.8 C     Last Pain:  Vitals:   11/19/16 1612  TempSrc:   PainSc: 8                  Anandi Abramo DAVID

## 2016-11-19 NOTE — Progress Notes (Signed)
[facility], Danelle BerryJasmine Jones, updated medication history.

## 2016-11-22 ENCOUNTER — Encounter: Payer: Self-pay | Admitting: Family

## 2016-11-22 ENCOUNTER — Non-Acute Institutional Stay (SKILLED_NURSING_FACILITY): Payer: Medicare HMO | Admitting: Family

## 2016-11-22 ENCOUNTER — Encounter: Payer: Self-pay | Admitting: *Deleted

## 2016-11-22 DIAGNOSIS — D72829 Elevated white blood cell count, unspecified: Secondary | ICD-10-CM | POA: Diagnosis not present

## 2016-11-22 DIAGNOSIS — E440 Moderate protein-calorie malnutrition: Secondary | ICD-10-CM

## 2016-11-22 DIAGNOSIS — E8809 Other disorders of plasma-protein metabolism, not elsewhere classified: Secondary | ICD-10-CM

## 2016-11-22 NOTE — Progress Notes (Signed)
Location:  [facility] Room Number: [phone] Place of Service:  SNF (31) Provider:  Ranay Ketter FNP-C   Oneal Grout, MD  Patient Care Team: Oneal Grout, MD as PCP - General (Internal Medicine) Runell Gess, MD as Consulting Physician (Cardiology) Nadara Mustard, MD as Consulting Physician (Orthopedic Surgery) Lorin Mercy as Physician Assistant (Cardiology)  Extended Emergency Contact Information Primary Emergency Contact: [name],[name] Address: [address]          [place], [place] [address] [place] Home Phone: [phone] Mobile Phone: [phone] Relation: Niece Secondary Emergency Contact: [name] Home Phone: [phone] Mobile Phone: [phone] Relation: Friend  Code Status: Full Code  Goals of care: Advanced Directive information Advanced Directives 11/22/2016  Does Patient Have a Medical Advance Directive? No  Type of Advance Directive -  Does patient want to make changes to medical advance directive? -  Copy of Healthcare Power of Attorney in Chart? -  Would patient like information on creating a medical advance directive? -     Chief Complaint  Patient presents with  . Acute Visit    Abnormal labs    HPI:  Pt is a 76 y.o. male seen today at [facility] for acute visit for evaluation of abnormal lab results.He has a medical history of HTN, PVD, COPD, Hypothyroidism, Right BKA among other conditions.He is status post left foot He is seen in his room today. He denies any new acute issues this visit. Of note he is status post left transmetatarsal amputation debridement on 11/19/2016. He has a wound vac in place until seen by Ortho.His recent lab results showed WBC 14.7 Hgb 9.5 ( 5/2 /2018).  Past Medical History:  Diagnosis Date  . Adult failure to thrive    /notes 06/23/2015  . Anemia   . Arthritis    "hands, fingers" (11/10/2016  . COPD (chronic  obstructive pulmonary disease) (HCC)    LONG TIME SMOKER  . Critical lower limb ischemia   . 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 Depression    "periods of depression" (06/23/2015)  . Gout   . Hypothyroidism   . PAD (peripheral artery disease) (HCC)   . Physical deconditioning   . Pyelonephritis 06/23/2015  . Status post foot surgery    right fifth toe amputation by Dr. Lajoyce Corners    Past Surgical History:  Procedure Laterality Date  . ABDOMINAL AORTOGRAM W/LOWER EXTREMITY N/A 09/03/2016   Procedure: Abdominal Aortogram w/Lower Extremity;  Surgeon: Sherren Kerns, MD;  Location: [facility] INVASIVE CV LAB;  Service: Cardiovascular;  Laterality: N/A;  Lt. leg  . ABDOMINAL AORTOGRAM W/LOWER EXTREMITY N/A 11/10/2016   Procedure: Abdominal Aortogram w/Lower Extremity;  Surgeon: Maeola Harman, MD;  Location: [facility] INVASIVE CV LAB;  Service: Cardiovascular;  Laterality: N/A;  . AMPUTATION Right 03/01/2014   Procedure: AMPUTATION RAY;  Surgeon: Nadara Mustard, MD;  Location: [facility] OR;  Service: Orthopedics;  Laterality: Right;  Right Foot 5th Ray Amputation  . AMPUTATION Right 04/26/2014   Procedure: Right Foot 4th Ray Amputation;  Surgeon: Nadara Mustard, MD;  Location: [facility] OR;  Service: Orthopedics;  Laterality: Right;  . AMPUTATION Right 10/01/2015   Procedure: Right Below Knee Amputation;  Surgeon: Nadara Mustard, MD;  Location: [facility] OR;  Service: Orthopedics;  Laterality: Right;  . AMPUTATION Left 07/23/2016   Procedure: Left 2nd Toe Amputation;  Surgeon: Berna Spare  Kandis Mannan, MD;  Location: [facility] OR;  Service: Orthopedics;  Laterality: Left;  . AMPUTATION Left 10/08/2016   Procedure: Left Transmetatarsal Amputation;  Surgeon: Nadara Mustard, MD;  Location: [facility] OR;  Service: Orthopedics;  Laterality: Left;  . ANKLE ARTHROSCOPY Right 08/13/2015   Procedure: ANKLE ARTHROSCOPY;  Surgeon: Nadara Mustard, MD;  Location: [facility] OR;  Service: Orthopedics;  Laterality: Right;  . CATARACT EXTRACTION  W/ INTRAOCULAR LENS  IMPLANT, BILATERAL Bilateral   . INGUINAL HERNIA REPAIR Left   . KNEE CARTILAGE SURGERY Left 1960's   football injury  . KNEE LIGAMENT RECONSTRUCTION Left 1960's  . LOWER EXTREMITY ANGIOGRAM N/A 05/27/2014   Procedure: LOWER EXTREMITY ANGIOGRAM;  Surgeon: Runell Gess, MD;  Location: [facility] CATH LAB;  Service: Cardiovascular;  Laterality: N/A;  . LOWER EXTREMITY ANGIOGRAPHY Left 09/15/2016   Procedure: Lower Extremity Angiography;  Surgeon: Maeola Harman, MD;  Location: [facility] INVASIVE CV LAB;  Service: Cardiovascular;  Laterality: Left;  . PERIPHERAL VASCULAR ATHERECTOMY  11/10/2016   Procedure: Peripheral Vascular Atherectomy;  Surgeon: Maeola Harman, MD;  Location: [facility] INVASIVE CV LAB;  Service: Cardiovascular;;  . PERIPHERAL VASCULAR INTERVENTION Left 09/15/2016   Procedure: Peripheral Vascular Intervention;  Surgeon: Maeola Harman, MD;  Location: [facility] INVASIVE CV LAB;  Service: Cardiovascular;  Laterality: Left;  SFA/popiteal  . PERIPHERAL VASCULAR INTERVENTION  11/10/2016   Hattie Perch 11/10/2016  . PERIPHERAL VASCULAR INTERVENTION  11/10/2016   Procedure: Peripheral Vascular Intervention;  Surgeon: Maeola Harman, MD;  Location: [facility] INVASIVE CV LAB;  Service: Cardiovascular;;  . REFRACTIVE SURGERY Bilateral   . STUMP REVISION Right 12/05/2015   Procedure: Revision Right Below Knee Amputation;  Surgeon: Nadara Mustard, MD;  Location: [facility] OR;  Service: Orthopedics;  Laterality: Right;  . STUMP REVISION Left 11/19/2016   Procedure: Revision Left Transmetatarsal Amputation;  Surgeon: Nadara Mustard, MD;  Location: [facility] OR;  Service: Orthopedics;  Laterality: Left;    No Known Allergies  Allergies as of 11/22/2016   No Known Allergies     Medication List       Accurate as of 11/22/16  2:48 PM. Always use your most recent med list.          albuterol 108 (90 Base) MCG/ACT inhaler Commonly known as:  PROVENTIL HFA;VENTOLIN HFA Inhale 2 puffs  into the lungs 2 (two) times daily. 0900 & 1700   allopurinol 100 MG tablet Commonly known as:  ZYLOPRIM Take 100 mg by mouth daily.   aspirin 81 MG chewable tablet Chew 81 mg by mouth daily.   cholecalciferol 1000 units tablet Commonly known as:  VITAMIN D Take 1,000 Units by mouth daily. (0900)   clopidogrel 75 MG tablet Commonly known as:  PLAVIX Take 1 tablet (75 mg total) by mouth daily with breakfast.   DULoxetine 20 MG capsule Commonly known as:  CYMBALTA Take 20 mg by mouth daily.   famotidine 20 MG tablet Commonly known as:  PEPCID Take 20 mg by mouth daily.   feeding supplement (PRO-STAT 64) Liqd Take 30 mLs by mouth daily.   gabapentin 100 MG capsule Commonly known as:  NEURONTIN Take 200 mg by mouth at bedtime.   levothyroxine 25 MCG tablet Commonly known as:  SYNTHROID, LEVOTHROID Take 12.5 mcg by mouth daily at 6 (six) AM.   loperamide 2 MG tablet Commonly known as:  IMODIUM A-D Take 2-4 mg by mouth every 4 (four) hours as needed for diarrhea or loose stools (Do not give if <3  stools or DX. w/c. diff or norovirus. Do not exceed 16 mg/24hrs.).   meloxicam 7.5 MG tablet Commonly known as:  MOBIC Take 7.5 mg by mouth daily.   methocarbamol 500 MG tablet Commonly known as:  ROBAXIN Take 500 mg by mouth 2 (two) times daily.   multivitamin with minerals tablet Take 1 tablet by mouth daily. (0900)   omeprazole 20 MG capsule Commonly known as:  PRILOSEC Take 20 mg by mouth daily at 6 (six) AM.   oxyCODONE 5 MG immediate release tablet Commonly known as:  Oxy IR/ROXICODONE Take 1-2 tablets (5-10 mg total) by mouth every 4 (four) hours as needed (5mg  for moderate pain, 10mg  for severe pain).   polyethylene glycol packet Commonly known as:  MIRALAX / GLYCOLAX Take 17 g by mouth daily. Hold for loose stool   saccharomyces boulardii 250 MG capsule Commonly known as:  FLORASTOR Take 250 mg by mouth 2 (two) times daily.   traZODone 50 MG  tablet Commonly known as:  DESYREL Take 50 mg by mouth at bedtime.   UNABLE TO FIND Take 90 mLs by mouth 2 (two) times daily. Med Name: Medpass       Review of Systems  Constitutional: Negative for activity change, appetite change, chills, fatigue and fever.  HENT: Negative for congestion, postnasal drip, rhinorrhea, sinus pressure, sneezing and sore throat.   Eyes: Negative.   Respiratory: Negative for cough, chest tightness, shortness of breath and wheezing.   Cardiovascular: Negative for chest pain, palpitations and leg swelling.  Gastrointestinal: Negative for abdominal distention, abdominal pain, constipation, diarrhea, nausea and vomiting.  Endocrine: Negative for cold intolerance, heat intolerance, polydipsia, polyphagia and polyuria.  Genitourinary: Negative for dysuria, frequency and urgency.  Musculoskeletal: Positive for gait problem.  Skin: Positive for color change. Negative for pallor and rash.       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 Gangrene on left great toe, 3rd, 4th and 5 th toe with new area on lateral foot.   Allergic/Immunologic: Negative.   Neurological: Negative for dizziness, seizures, syncope, light-headedness and headaches.  Hematological: Does not bruise/bleed easily.  Psychiatric/Behavioral: Negative for agitation, confusion, hallucinations and sleep disturbance. The patient is not nervous/anxious.     Immunization History  Administered Date(s) Administered  . Influenza,inj,Quad PF,36+ Mos 06/25/2015  . PPD Test 10/03/2015, 10/17/2015  . Pneumococcal Conjugate-13 10/03/2015  . Pneumococcal Polysaccharide-23 06/25/2015   Pertinent  Health Maintenance Due  Topic Date Due  . INFLUENZA VACCINE  07/19/2017 (Originally 02/16/2017)  . COLONOSCOPY  07/19/2017 (Originally 03/04/1992)  . PNA vac Low Risk Adult  Completed      Vitals:   11/22/16 1040  BP: (!) 155/68  Pulse: 66  Resp: 20  Temp: 99.4 F (37.4 C)  SpO2: 97%  Weight:  149 lb 12.8 oz (67.9 kg)  Height: 6\' 4"  (1.93 m)   Body mass index is 18.23 kg/m. Physical Exam  Constitutional: He is oriented to person, place, and time.  Thin frail elderly in no acute distress.   HENT:  Head: Normocephalic.  Mouth/Throat: Oropharynx is clear and moist.  Eyes: Conjunctivae and EOM are normal. Pupils are equal, round, and reactive to light. Right eye exhibits no discharge. Left eye exhibits no discharge. No scleral icterus.  Neck: Normal range of motion. No JVD present. No thyromegaly present.  Cardiovascular: Normal rate, regular rhythm, normal heart sounds and intact distal pulses.  Exam reveals no gallop and no friction rub.   No murmur heard. Pulmonary/Chest: Effort  normal and breath sounds normal. No respiratory distress. He has no wheezes. He has no rales.  Abdominal: Soft. Bowel sounds are normal. He exhibits no distension and no mass. There is no tenderness. There is no rebound and no guarding.  Genitourinary:  Genitourinary Comments: Voids in urinal   Musculoskeletal: He exhibits no edema or tenderness.  Right BKA. And Left metatarsal amputation.Tophus on fingers.   Lymphadenopathy:    He has no cervical adenopathy.  Neurological: He is oriented to person, place, and time.  Skin: Skin is warm and dry. No rash noted. No erythema. No pallor.  Left leg wound vac in place draining serosanguinous drainage.   Psychiatric: He has a normal mood and affect.    Labs reviewed:  Recent Labs  09/16/16 0206  10/08/16 1211  11/10/16 0855 11/10/16 1924 11/11/16 0406 11/17/16  NA 139  < > 141  < > 140  --  136 141  K 4.7  < > 4.3  < > 3.8  --  3.6 4.4  CL 109  --  111  --  106  --  105  --   CO2 24  --  21*  --   --   --  25  --   GLUCOSE 93  --  85  --  76  --  104*  --   BUN 19  < > 16  < > 11  --  11 12  CREATININE 1.22  < > 1.27*  < > 1.10 0.98 1.15 1.0  CALCIUM 8.6*  --  8.9  --   --   --  8.2*  --   < > = values in this interval not displayed.  Recent  Labs  04/12/16 10/15/16 11/17/16  AST 9* 13* 12*  ALT 5* 4* 4*  ALKPHOS 70 70 58    Recent Labs  04/12/16 07/22/16 1626  10/08/16 1211  11/10/16 1924 11/11/16 0406 11/17/16  WBC 8.0 17.2*  < > 17.3*  < > 12.0* 9.6 14.0  NEUTROABS 3 10.3*  --   --   --   --   --   --   HGB 10.3* 10.6*  < > 11.4*  < > 10.7* 9.4* 9.5*  HCT 34* 33.8*  < > 36.2*  < > 34.9* 31.1* 32*  MCV  --  72.7*  < > 76.7*  --  75.1* 75.1*  --   PLT 321 293  < > 389  < > 246 227 264  < > = values in this interval not displayed. Lab Results  Component Value Date   TSH 4.83 09/09/2016   Lab Results  Component Value Date   HGBA1C 5.4 07/23/2016   Lab Results  Component Value Date   CHOL 129 02/09/2016   HDL 22 (A) 02/09/2016   LDLCALC 78 02/09/2016   TRIG 144 02/09/2016   Assessment/Plan 1. Leukocytosis   Afebrile. WBC 14.0 ( 11/17/2016) previous16.2 (09/27/2016). WBC trending down status post transmetatarsal amputation of left foot debridement 11/19/2016. Wound vac in place draining serosanguinous drainage.Continue to follow up with [facility].continue to monitor Temp curve. Recheck CBC/diff and BMP 11/23/2016  2. Anemia  Recent Hgb 9.5 (11/17/2016) previous Hgb 9.6.Post transmetatarsal amputation of left foot debridement.monitor CBC.     Family/ staff Communication: Reviewed plan of care with patient and facility   Labs/tests ordered: CBC/diff and BMP 11/23/2016

## 2016-11-25 ENCOUNTER — Inpatient Hospital Stay (INDEPENDENT_AMBULATORY_CARE_PROVIDER_SITE_OTHER): Payer: Medicare HMO | Admitting: Orthopedic Surgery

## 2016-11-25 ENCOUNTER — Ambulatory Visit (INDEPENDENT_AMBULATORY_CARE_PROVIDER_SITE_OTHER): Payer: Medicare HMO | Admitting: Orthopedic Surgery

## 2016-11-26 ENCOUNTER — Inpatient Hospital Stay (INDEPENDENT_AMBULATORY_CARE_PROVIDER_SITE_OTHER): Payer: Medicare HMO | Admitting: Orthopedic Surgery

## 2016-11-26 ENCOUNTER — Ambulatory Visit (INDEPENDENT_AMBULATORY_CARE_PROVIDER_SITE_OTHER): Payer: Medicare HMO | Admitting: Family

## 2016-11-26 DIAGNOSIS — Z89432 Acquired absence of left foot: Secondary | ICD-10-CM

## 2016-11-26 NOTE — Progress Notes (Signed)
Office Visit Note   Patient: Caleb Hays           Date of Birth: Jan 15, 1941           MRN: [id number] Visit Date: 11/26/2016              Requested by: [name], [name], MD [address] [place], [place] [id number] PCP: [name], [name], MD  No chief complaint on file.     HPI: The patient is a 76 year old gentleman who is 1 week status post revision of a left transmetatarsal amputation. The wound VAC was removed today. The patient is without complaint today.  Assessment & Plan: Visit Diagnoses:  1. S/P transmetatarsal amputation of foot, left (HCC)     Plan: Continue nonweightbearing. Daily wound cleansing with Dial soap. Apply dry dressing. Elevate as able. We'll follow-up in office in 3 weeks for suture removal. Of note the patient is also following a vascular surgery.  Follow-Up Instructions: Return in about 3 weeks (around 12/17/2016).   Ortho Exam  Patient is alert, oriented, no adenopathy, well-dressed, normal affect, normal respiratory effort. Incision is well proximate with sutures there is no gaping there is some maceration surrounding the wound there is no breakdown of the incision no depth there is no drainage no odor no sign of infection.  Imaging: No results found.  Labs: Lab Results  Component Value Date   HGBA1C 5.4 07/23/2016   HGBA1C 5.3 02/09/2016   HGBA1C 4.4 12/30/2015   ESRSEDRATE 25 (H) 07/22/2016   ESRSEDRATE 11 04/12/2016   ESRSEDRATE 61 (H) 08/13/2015   CRP 1.7 (H) 07/22/2016   CRP 9.6 (H) 08/13/2015   CRP 19.5 (H) 08/10/2015   LABURIC 6.6 07/23/2016   LABURIC 7.3 08/13/2015   LABURIC 9.4 (H) 08/09/2015   REPTSTATUS 07/25/2016 FINAL 07/24/2016   GRAMSTAIN  08/13/2015    RARE WBC PRESENT, PREDOMINANTLY PMN NO ORGANISMS SEEN [facility]    GRAMSTAIN  08/13/2015    RARE WBC PRESENT, PREDOMINANTLY PMN NO ORGANISMS SEEN Performed at [facility]    CULT NO GROWTH 07/24/2016   LABORGA STAPHYLOCOCCUS AUREUS  08/13/2015    Orders:  No orders of the defined types were placed in this encounter.  No orders of the defined types were placed in this encounter.    Procedures: No procedures performed  Clinical Data: No additional findings.  ROS:  All other systems negative, except as noted in the HPI. Review of Systems  Objective: Vital Signs: There were no vitals taken for this visit.  Specialty Comments:  No specialty comments available.  PMFS History: Patient Active Problem List   Diagnosis Date Noted  . S/P transmetatarsal amputation of foot, left (HCC) 10/08/2016  . PAD (peripheral artery disease) (HCC) 09/15/2016  . Gangrene of left foot (HCC)   . Osteomyelitis (HCC) 07/22/2016  . Edema 05/05/2016  . Phantom pain (HCC) 03/08/2016  . Gout 03/08/2016  . Hypothyroidism (acquired) 03/08/2016  . CKD (chronic kidney disease) stage 3, GFR 30-59 ml/min 03/08/2016  . GERD without esophagitis 03/08/2016  . Constipation 12/29/2015  . Below knee amputation status (HCC) 10/01/2015  . Ankle pain 08/13/2015  . Adult failure to thrive   . PVD (peripheral vascular disease) (HCC)   . Elevated blood uric acid level 08/10/2015  . 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 Anemia of chronic disease 08/10/2015  . Malnutrition of moderate degree 06/24/2015  . Failure to thrive in adult 06/23/2015  . Weight loss 06/23/2015  .  Tobacco abuse 06/23/2015  . COPD (chronic obstructive pulmonary disease) (HCC) 06/23/2015  . Fall   . Critical lower limb ischemia 05/31/2014  . Status post right foot surgery 03/12/2014   Past Medical History:  Diagnosis Date  . Adult failure to thrive    /notes 06/23/2015  . Anemia   . Arthritis    "hands, fingers" (11/10/2016  . COPD (chronic obstructive pulmonary disease) (HCC)    LONG TIME SMOKER  . Critical lower limb ischemia   . 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 Depression    "periods of depression" (06/23/2015)  .  Gout   . Hypothyroidism   . PAD (peripheral artery disease) (HCC)   . Physical deconditioning   . Pyelonephritis 06/23/2015  . Status post foot surgery    right fifth toe amputation by Dr. Lajoyce Corners     Family History  Problem Relation Age of Onset  . Heart failure Father   . Heart attack Father   . Heart failure Brother     Past Surgical History:  Procedure Laterality Date  . ABDOMINAL AORTOGRAM W/LOWER EXTREMITY N/A 09/03/2016   Procedure: Abdominal Aortogram w/Lower Extremity;  Surgeon: Sherren Kerns, MD;  Location: [facility] INVASIVE CV LAB;  Service: Cardiovascular;  Laterality: N/A;  Lt. leg  . ABDOMINAL AORTOGRAM W/LOWER EXTREMITY N/A 11/10/2016   Procedure: Abdominal Aortogram w/Lower Extremity;  Surgeon: Maeola Harman, MD;  Location: [facility] INVASIVE CV LAB;  Service: Cardiovascular;  Laterality: N/A;  . AMPUTATION Right 03/01/2014   Procedure: AMPUTATION RAY;  Surgeon: Nadara Mustard, MD;  Location: [facility] OR;  Service: Orthopedics;  Laterality: Right;  Right Foot 5th Ray Amputation  . AMPUTATION Right 04/26/2014   Procedure: Right Foot 4th Ray Amputation;  Surgeon: Nadara Mustard, MD;  Location: [facility] OR;  Service: Orthopedics;  Laterality: Right;  . AMPUTATION Right 10/01/2015   Procedure: Right Below Knee Amputation;  Surgeon: Nadara Mustard, MD;  Location: [facility] OR;  Service: Orthopedics;  Laterality: Right;  . AMPUTATION Left 07/23/2016   Procedure: Left 2nd Toe Amputation;  Surgeon: Nadara Mustard, MD;  Location: [facility] OR;  Service: Orthopedics;  Laterality: Left;  . AMPUTATION Left 10/08/2016   Procedure: Left Transmetatarsal Amputation;  Surgeon: Nadara Mustard, MD;  Location: [facility] OR;  Service: Orthopedics;  Laterality: Left;  . ANKLE ARTHROSCOPY Right 08/13/2015   Procedure: ANKLE ARTHROSCOPY;  Surgeon: Nadara Mustard, MD;  Location: [facility] OR;  Service: Orthopedics;  Laterality: Right;  . CATARACT EXTRACTION W/ INTRAOCULAR LENS  IMPLANT, BILATERAL Bilateral   . INGUINAL HERNIA REPAIR Left   . KNEE  CARTILAGE SURGERY Left 1960's   football injury  . KNEE LIGAMENT RECONSTRUCTION Left 1960's  . LOWER EXTREMITY ANGIOGRAM N/A 05/27/2014   Procedure: LOWER EXTREMITY ANGIOGRAM;  Surgeon: Runell Gess, MD;  Location: [facility] CATH LAB;  Service: Cardiovascular;  Laterality: N/A;  . LOWER EXTREMITY ANGIOGRAPHY Left 09/15/2016   Procedure: Lower Extremity Angiography;  Surgeon: Maeola Harman, MD;  Location: [facility] INVASIVE CV LAB;  Service: Cardiovascular;  Laterality: Left;  . PERIPHERAL VASCULAR ATHERECTOMY  11/10/2016   Procedure: Peripheral Vascular Atherectomy;  Surgeon: Maeola Harman, MD;  Location: [facility] INVASIVE CV LAB;  Service: Cardiovascular;;  . PERIPHERAL VASCULAR INTERVENTION Left 09/15/2016   Procedure: Peripheral Vascular Intervention;  Surgeon: Maeola Harman, MD;  Location: [facility] INVASIVE CV LAB;  Service: Cardiovascular;  Laterality: Left;  SFA/popiteal  . PERIPHERAL VASCULAR INTERVENTION  11/10/2016   Hattie Perch  11/10/2016  . PERIPHERAL VASCULAR INTERVENTION  11/10/2016   Procedure: Peripheral Vascular Intervention;  Surgeon: Maeola Harman, MD;  Location: [facility] INVASIVE CV LAB;  Service: Cardiovascular;;  . REFRACTIVE SURGERY Bilateral   . STUMP REVISION Right 12/05/2015   Procedure: Revision Right Below Knee Amputation;  Surgeon: Nadara Mustard, MD;  Location: [facility] OR;  Service: Orthopedics;  Laterality: Right;  . STUMP REVISION Left 11/19/2016   Procedure: Revision Left Transmetatarsal Amputation;  Surgeon: Nadara Mustard, MD;  Location: [facility] OR;  Service: Orthopedics;  Laterality: Left;   Social History   Occupational History  . Not on file.   Social History Main Topics  . Smoking status: Former Smoker    Packs/day: 0.50    Years: 57.00    Types: Cigarettes    Quit date: 03/19/2016  . Smokeless tobacco: Never Used  . Alcohol use Yes     Comment: 11/10/2016 I was nking alot of beer; quit in 2015"   . Drug use: No  . Sexual activity: No

## 2016-12-06 ENCOUNTER — Non-Acute Institutional Stay (SKILLED_NURSING_FACILITY): Payer: Medicare HMO

## 2016-12-06 DIAGNOSIS — Z0000 Encounter for general adult medical examination without abnormal findings: Secondary | ICD-10-CM | POA: Diagnosis not present

## 2016-12-06 NOTE — Patient Instructions (Signed)
Caleb Hays , Thank you for taking time to come for your Medicare Wellness Visit. I appreciate your ongoing commitment to your health goals. Please review the following plan we discussed and let me know if I can assist you in the future.   Screening recommendations/referrals: Colonoscopy- pt is long term Recommended yearly ophthalmology/optometry visit for glaucoma screening and checkup Recommended yearly dental visit for hygiene and checkup  Vaccinations: Influenza vaccine due when available Pneumococcal vaccine up to date Tdap vaccine not in records Shingles vaccine not in records  Advanced directives: Need copy for chart  Conditions/risks identified: none  Next appointment: none upcoming  Preventive Care 65 Years and Older, Male Preventive care refers to lifestyle choices and visits with your health care provider that can promote health and wellness. What does preventive care include?  A yearly physical exam. This is also called an annual well check.  Dental exams once or twice a year.  Routine eye exams. Ask your health care provider how often you should have your eyes checked.  Personal lifestyle choices, including:  Daily care of your teeth and gums.  Regular physical activity.  Eating a healthy diet.  Avoiding tobacco and drug use.  Limiting alcohol use.  Practicing safe sex.  Taking low doses of aspirin every day.  Taking vitamin and mineral supplements as recommended by your health care provider. What happens during an annual well check? The services and screenings done by your health care provider during your annual well check will depend on your age, overall health, lifestyle risk factors, and family history of disease. Counseling  Your health care provider may ask you questions about your:  Alcohol use.  Tobacco use.  Drug use.  Emotional well-being.  Home and relationship well-being.  Sexual activity.  Eating habits.  History of  falls.  Memory and ability to understand (cognition).  Work and work Astronomerenvironment. Screening  You may have the following tests or measurements:  Height, weight, and BMI.  Blood pressure.  Lipid and cholesterol levels. These may be checked every 5 years, or more frequently if you are over 75 years old.  Skin check.  Lung cancer screening. You may have this screening every year starting at age 75 if you have a 30-pack-year history of smoking and currently smoke or have quit within the past 15 years.  Fecal occult blood test (FOBT) of the stool. You may have this test every year starting at age 75.  Flexible sigmoidoscopy or colonoscopy. You may have a sigmoidoscopy every 5 years or a colonoscopy every 10 years starting at age 75.  Prostate cancer screening. Recommendations will vary depending on your family history and other risks.  Hepatitis C blood test.  Hepatitis B blood test.  Sexually transmitted disease (STD) testing.  Diabetes screening. This is done by checking your blood sugar (glucose) after you have not eaten for a while (fasting). You may have this done every 1-3 years.  Abdominal aortic aneurysm (AAA) screening. You may need this if you are a current or former smoker.  Osteoporosis. You may be screened starting at age 75 if you are at high risk. Talk with your health care provider about your test results, treatment options, and if necessary, the need for more tests. Vaccines  Your health care provider may recommend certain vaccines, such as:  Influenza vaccine. This is recommended every year.  Tetanus, diphtheria, and acellular pertussis (Tdap, Td) vaccine. You may need a Td booster every 10 years.  Zoster vaccine. You may  need this after age 1.  Pneumococcal 13-valent conjugate (PCV13) vaccine. One dose is recommended after age 38.  Pneumococcal polysaccharide (PPSV23) vaccine. One dose is recommended after age 26. Talk to your health care provider about  which screenings and vaccines you need and how often you need them. This information is not intended to replace advice given to you by your health care provider. Make sure you discuss any questions you have with your health care provider. Document Released: 08/01/2015 Document Revised: 03/24/2016 Document Reviewed: 05/06/2015 Elsevier Interactive Patient Education  2017 [employer] Prevention in the Home Falls can cause injuries. They can happen to people of all ages. There are many things you can do to make your home safe and to help prevent falls. What can I do on the outside of my home?  Regularly fix the edges of walkways and driveways and fix any cracks.  Remove anything that might make you trip as you walk through a door, such as a raised step or threshold.  Trim any bushes or trees on the path to your home.  Use bright outdoor lighting.  Clear any walking paths of anything that might make someone trip, such as rocks or tools.  Regularly check to see if handrails are loose or broken. Make sure that both sides of any steps have handrails.  Any raised decks and porches should have guardrails on the edges.  Have any leaves, snow, or ice cleared regularly.  Use sand or salt on walking paths during winter.  Clean up any spills in your garage right away. This includes oil or grease spills. What can I do in the bathroom?  Use night lights.  Install grab bars by the toilet and in the tub and shower. Do not use towel bars as grab bars.  Use non-skid mats or decals in the tub or shower.  If you need to sit down in the shower, use a plastic, non-slip stool.  Keep the floor dry. Clean up any water that spills on the floor as soon as it happens.  Remove soap buildup in the tub or shower regularly.  Attach bath mats securely with double-sided non-slip rug tape.  Do not have throw rugs and other things on the floor that can make you trip. What can I do in the  bedroom?  Use night lights.  Make sure that you have a light by your bed that is easy to reach.  Do not use any sheets or blankets that are too big for your bed. They should not hang down onto the floor.  Have a firm chair that has side arms. You can use this for support while you get dressed.  Do not have throw rugs and other things on the floor that can make you trip. What can I do in the kitchen?  Clean up any spills right away.  Avoid walking on wet floors.  Keep items that you use a lot in easy-to-reach places.  If you need to reach something above you, use a strong step stool that has a grab bar.  Keep electrical cords out of the way.  Do not use floor polish or wax that makes floors slippery. If you must use wax, use non-skid floor wax.  Do not have throw rugs and other things on the floor that can make you trip. What can I do with my stairs?  Do not leave any items on the stairs.  Make sure that there are handrails on both sides  of the stairs and use them. Fix handrails that are broken or loose. Make sure that handrails are as long as the stairways.  Check any carpeting to make sure that it is firmly attached to the stairs. Fix any carpet that is loose or worn.  Avoid having throw rugs at the top or bottom of the stairs. If you do have throw rugs, attach them to the floor with carpet tape.  Make sure that you have a light switch at the top of the stairs and the bottom of the stairs. If you do not have them, ask someone to add them for you. What else can I do to help prevent falls?  Wear shoes that:  Do not have high heels.  Have rubber bottoms.  Are comfortable and fit you well.  Are closed at the toe. Do not wear sandals.  If you use a stepladder:  Make sure that it is fully opened. Do not climb a closed stepladder.  Make sure that both sides of the stepladder are locked into place.  Ask someone to hold it for you, if possible.  Clearly mark and make  sure that you can see:  Any grab bars or handrails.  First and last steps.  Where the edge of each step is.  Use tools that help you move around (mobility aids) if they are needed. These include:  Canes.  Walkers.  Scooters.  Crutches.  Turn on the lights when you go into a dark area. Replace any light bulbs as soon as they burn out.  Set up your furniture so you have a clear path. Avoid moving your furniture around.  If any of your floors are uneven, fix them.  If there are any pets around you, be aware of where they are.  Review your medicines with your doctor. Some medicines can make you feel dizzy. This can increase your chance of falling. Ask your doctor what other things that you can do to help prevent falls. This information is not intended to replace advice given to you by your health care provider. Make sure you discuss any questions you have with your health care provider. Document Released: 05/01/2009 Document Revised: 12/11/2015 Document Reviewed: 08/09/2014 Elsevier Interactive Patient Education  2017 [employer].

## 2016-12-06 NOTE — Progress Notes (Signed)
Subjective:   Caleb MarinerJerald J Hays is a 76 y.o. male who presents for an Initial Medicare Annual Wellness Visit at [facility].   Objective:    Today's Vitals   12/06/16 1412  BP: (!) 148/70  Pulse: (!) 56  Temp: 97.6 F (36.4 C)  TempSrc: Oral  SpO2: 97%  Weight: 150 lb (68 kg)  Height: 6\' 4"  (1.93 m)   Body mass index is 18.26 kg/m.  Current Medications (verified) Outpatient Encounter Prescriptions as of 12/06/2016  Medication Sig  . albuterol (PROVENTIL HFA;VENTOLIN HFA) 108 (90 BASE) MCG/ACT inhaler Inhale 2 puffs into the lungs 2 (two) times daily. 0900 & 1700  . allopurinol (ZYLOPRIM) 100 MG tablet Take 100 mg by mouth daily.   . Amino Acids-Protein Hydrolys (FEEDING SUPPLEMENT, PRO-STAT 64,) LIQD Take 30 mLs by mouth daily.  Marland Kitchen. aspirin 81 MG chewable tablet Chew 81 mg by mouth daily.  . cholecalciferol (VITAMIN D) 1000 units tablet Take 1,000 Units by mouth daily. (0900)  . clopidogrel (PLAVIX) 75 MG tablet Take 1 tablet (75 mg total) by mouth daily with breakfast.  . DULoxetine (CYMBALTA) 20 MG capsule Take 20 mg by mouth daily.  . famotidine (PEPCID) 20 MG tablet Take 20 mg by mouth daily.   Marland Kitchen. gabapentin (NEURONTIN) 100 MG capsule Take 200 mg by mouth at bedtime.   Marland Kitchen. levothyroxine (SYNTHROID, LEVOTHROID) 25 MCG tablet Take 12.5 mcg by mouth daily at 6 (six) AM.   . loperamide (IMODIUM A-D) 2 MG tablet Take 2-4 mg by mouth every 4 (four) hours as needed for diarrhea or loose stools (Do not give if <3 stools or DX. w/c. diff or norovirus. Do not exceed 16 mg/24hrs.).   Marland Kitchen. meloxicam (MOBIC) 7.5 MG tablet Take 7.5 mg by mouth daily.   . methocarbamol (ROBAXIN) 500 MG tablet Take 500 mg by mouth 2 (two) times daily.   . Multiple Vitamins-Minerals (MULTIVITAMIN WITH MINERALS) tablet Take 1 tablet by mouth daily. (0900)  . omeprazole (PRILOSEC) 20 MG capsule Take 20 mg by mouth daily at 6 (six) AM.   . oxyCODONE (OXY IR/ROXICODONE) 5 MG immediate release tablet  Take 1-2 tablets (5-10 mg total) by mouth every 4 (four) hours as needed (5mg  for moderate pain, 10mg  for severe pain).  . polyethylene glycol (MIRALAX / GLYCOLAX) packet Take 17 g by mouth daily. Hold for loose stool  . saccharomyces boulardii (FLORASTOR) 250 MG capsule Take 250 mg by mouth 2 (two) times daily.  . traZODone (DESYREL) 50 MG tablet Take 50 mg by mouth at bedtime.   Marland Kitchen. UNABLE TO FIND Take 90 mLs by mouth 2 (two) times daily. Med Name: Medpass   No facility-administered encounter medications on file as of 12/06/2016.     Allergies (verified) Patient has no known allergies.   History: Past Medical History:  Diagnosis Date  . Adult failure to thrive    /notes 06/23/2015  . Anemia   . Arthritis    "hands, fingers" (11/10/2016  . COPD (chronic obstructive pulmonary disease) (HCC)    LONG TIME SMOKER  . Critical lower limb ischemia   . 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 Depression    "periods of depression" (06/23/2015)  . Gout   . Hypothyroidism   . PAD (peripheral artery disease) (HCC)   . Physical deconditioning   . Pyelonephritis 06/23/2015  . Status post foot surgery    right fifth toe amputation by Dr. Lajoyce Cornersuda  Past Surgical History:  Procedure Laterality Date  . ABDOMINAL AORTOGRAM W/LOWER EXTREMITY N/A 09/03/2016   Procedure: Abdominal Aortogram w/Lower Extremity;  Surgeon: Sherren Kerns, MD;  Location: [facility] INVASIVE CV LAB;  Service: Cardiovascular;  Laterality: N/A;  Lt. leg  . ABDOMINAL AORTOGRAM W/LOWER EXTREMITY N/A 11/10/2016   Procedure: Abdominal Aortogram w/Lower Extremity;  Surgeon: Maeola Harman, MD;  Location: [facility] INVASIVE CV LAB;  Service: Cardiovascular;  Laterality: N/A;  . AMPUTATION Right 03/01/2014   Procedure: AMPUTATION RAY;  Surgeon: Nadara Mustard, MD;  Location: [facility] OR;  Service: Orthopedics;  Laterality: Right;  Right Foot 5th Ray Amputation  . AMPUTATION Right 04/26/2014   Procedure: Right Foot  4th Ray Amputation;  Surgeon: Nadara Mustard, MD;  Location: [facility] OR;  Service: Orthopedics;  Laterality: Right;  . AMPUTATION Right 10/01/2015   Procedure: Right Below Knee Amputation;  Surgeon: Nadara Mustard, MD;  Location: [facility] OR;  Service: Orthopedics;  Laterality: Right;  . AMPUTATION Left 07/23/2016   Procedure: Left 2nd Toe Amputation;  Surgeon: Nadara Mustard, MD;  Location: [facility] OR;  Service: Orthopedics;  Laterality: Left;  . AMPUTATION Left 10/08/2016   Procedure: Left Transmetatarsal Amputation;  Surgeon: Nadara Mustard, MD;  Location: [facility] OR;  Service: Orthopedics;  Laterality: Left;  . ANKLE ARTHROSCOPY Right 08/13/2015   Procedure: ANKLE ARTHROSCOPY;  Surgeon: Nadara Mustard, MD;  Location: [facility] OR;  Service: Orthopedics;  Laterality: Right;  . CATARACT EXTRACTION W/ INTRAOCULAR LENS  IMPLANT, BILATERAL Bilateral   . INGUINAL HERNIA REPAIR Left   . KNEE CARTILAGE SURGERY Left 1960's   football injury  . KNEE LIGAMENT RECONSTRUCTION Left 1960's  . LOWER EXTREMITY ANGIOGRAM N/A 05/27/2014   Procedure: LOWER EXTREMITY ANGIOGRAM;  Surgeon: Runell Gess, MD;  Location: [facility] CATH LAB;  Service: Cardiovascular;  Laterality: N/A;  . LOWER EXTREMITY ANGIOGRAPHY Left 09/15/2016   Procedure: Lower Extremity Angiography;  Surgeon: Maeola Harman, MD;  Location: [facility] INVASIVE CV LAB;  Service: Cardiovascular;  Laterality: Left;  . PERIPHERAL VASCULAR ATHERECTOMY  11/10/2016   Procedure: Peripheral Vascular Atherectomy;  Surgeon: Maeola Harman, MD;  Location: [facility] INVASIVE CV LAB;  Service: Cardiovascular;;  . PERIPHERAL VASCULAR INTERVENTION Left 09/15/2016   Procedure: Peripheral Vascular Intervention;  Surgeon: Maeola Harman, MD;  Location: [facility] INVASIVE CV LAB;  Service: Cardiovascular;  Laterality: Left;  SFA/popiteal  . PERIPHERAL VASCULAR INTERVENTION  11/10/2016   Hattie Perch 11/10/2016  . PERIPHERAL VASCULAR INTERVENTION  11/10/2016   Procedure: Peripheral Vascular Intervention;   Surgeon: Maeola Harman, MD;  Location: [facility] INVASIVE CV LAB;  Service: Cardiovascular;;  . REFRACTIVE SURGERY Bilateral   . STUMP REVISION Right 12/05/2015   Procedure: Revision Right Below Knee Amputation;  Surgeon: Nadara Mustard, MD;  Location: [facility] OR;  Service: Orthopedics;  Laterality: Right;  . STUMP REVISION Left 11/19/2016   Procedure: Revision Left Transmetatarsal Amputation;  Surgeon: Nadara Mustard, MD;  Location: [facility] OR;  Service: Orthopedics;  Laterality: Left;   Family History  Problem Relation Age of Onset  . Heart failure Father   . Heart attack Father   . Heart failure Brother    Social History   Occupational History  . Not on file.   Social History Main Topics  . Smoking status: Former Smoker    Packs/day: 1.00    Years: 57.00    Types: Cigarettes    Quit date: 03/19/2016  . Smokeless tobacco: Never Used  . Alcohol use Yes  Comment: 11/10/2016 I was drinking alot of beer; quit in 2015"   . Drug use: No  . Sexual activity: No   Tobacco Counseling Counseling given: Not Answered   Activities of Daily Living In your present state of health, do you have any difficulty performing the following activities: 12/06/2016 11/19/2016  Hearing? N N  Vision? N N  Difficulty concentrating or making decisions? N N  Walking or climbing stairs? Y Y  Dressing or bathing? Y Y  Doing errands, shopping? Y -  Quarry manager and eating ? Y -  Using the Toilet? Y -  In the past six months, have you accidently leaked urine? N -  Do you have problems with loss of bowel control? N -  Managing your Medications? Y -  Managing your Finances? Y -  Housekeeping or managing your Housekeeping? Y -  Some recent data might be hidden    Immunizations and Health Maintenance Immunization History  Administered Date(s) Administered  . Influenza,inj,Quad PF,36+ Mos 06/25/2015  . PPD Test 10/03/2015, 10/17/2015  . Pneumococcal Conjugate-13 10/03/2015  . Pneumococcal Polysaccharide-23  06/25/2015   There are no preventive care reminders to display for this patient.  Patient Care Team: Oneal Grout, MD as PCP - General (Internal Medicine) Runell Gess, MD as Consulting Physician (Cardiology) Nadara Mustard, MD as Consulting Physician (Orthopedic Surgery) Abelino Derrick, PA-C as Physician Assistant (Cardiology)  Indicate any recent Medical Services you may have received from other than [facility] providers in the past year (date may be approximate).    Assessment:   This is a routine wellness examination for Arseniy.   Hearing/Vision screen No exam data present  Dietary issues and exercise activities discussed: Current Exercise Habits: Home exercise routine, Type of exercise: stretching, Time (Minutes): 10, Frequency (Times/Week): 7, Weekly Exercise (Minutes/Week): 70, Intensity: Mild  Goals    . Maintain Lifestyle          Starting today pt will maintain lifestyle.       Depression Screen PHQ 2/9 Scores 12/06/2016  PHQ - 2 Score 0    Fall Risk Fall Risk  12/06/2016  Falls in the past year? No    Cognitive Function:     6CIT Screen 12/06/2016  What Year? 0 points  What month? 0 points  What time? 0 points  Count back from 20 0 points  Months in reverse 0 points  Repeat phrase 2 points  Total Score 2    Screening Tests Health Maintenance  Topic Date Due  . INFLUENZA VACCINE  07/19/2017 (Originally 02/16/2017)  . COLONOSCOPY  07/19/2017 (Originally 03/04/1992)  . TETANUS/TDAP  07/20/2023 (Originally [birth year]/04/1961)  . PNA vac Low Risk Adult  Completed        Plan:    I have personally reviewed and addressed the Medicare Annual Wellness questionnaire and have noted the following in the patient's chart:  A. Medical and social history B. Use of alcohol, tobacco or illicit drugs  C. Current medications and supplements D. Functional ability and status E.  Nutritional status F.  Physical activity G. Advance directives H. List of other  physicians I.  Hospitalizations, surgeries, and ER visits in previous 12 months J.  Vitals K. Screenings to include hearing, vision, cognitive, depression L. Referrals and appointments - none  In addition, I have reviewed and discussed with patient certain preventive protocols, quality metrics, and best practice recommendations. A written personalized care plan for preventive services as well as general preventive health recommendations were provided  to patient.  See attached scanned questionnaire for additional information.   Signed,   Annetta Maw, RN Nurse Health Advisor   Quick Notes   Health Maintenance:         Abnormal Screen: 6 CIT-2. BP 148/70     Patient Concerns: None     Nurse Concerns: None

## 2016-12-20 ENCOUNTER — Encounter (HOSPITAL_COMMUNITY): Payer: Self-pay | Admitting: Orthopedic Surgery

## 2016-12-20 NOTE — Addendum Note (Signed)
Addendum  created 12/20/16 0957 by Kenai Fluegel, MD   Sign clinical note    

## 2016-12-23 ENCOUNTER — Ambulatory Visit (INDEPENDENT_AMBULATORY_CARE_PROVIDER_SITE_OTHER): Payer: Medicare HMO | Admitting: Orthopedic Surgery

## 2016-12-23 ENCOUNTER — Encounter: Payer: Self-pay | Admitting: Vascular Surgery

## 2016-12-23 DIAGNOSIS — Z89432 Acquired absence of left foot: Secondary | ICD-10-CM

## 2016-12-23 DIAGNOSIS — I739 Peripheral vascular disease, unspecified: Secondary | ICD-10-CM

## 2016-12-23 NOTE — Progress Notes (Signed)
Office Visit Note   Patient: Caleb Hays           Date of Birth: 1940-12-08           MRN: [id number] Visit Date: 12/23/2016              Requested by: [name], [name], MD [address] [place], [place] [id number] PCP: [name], [name], MD  Chief Complaint  Patient presents with  . Left Foot - Follow-up    11/19/16 Revision left transmetatarsal amputation       HPI: The patient is a 76 year old gentleman who is status post revision of a left transmetatarsal amputation 4 weeks ago. The patient is without complaint today. Residing at rehab currently.   Assessment & Plan: Visit Diagnoses:  1. S/P transmetatarsal amputation of foot, left (HCC)   2. PVD (peripheral vascular disease) (HCC)     Plan: may advance weight bearing. Follow up in office in 2 weeks. Sutures harvested. Anticipate he'll discharge home in interim. Continue daily wound cleansing and dry dressings.   Follow-Up Instructions: No Follow-up on file.   Ortho Exam  Patient is alert, oriented, no adenopathy, well-dressed, normal affect, normal respiratory effort. Incision is healing well. There are 3 remaining open areas with no depth along incision. Are less than 5 mm in diameter. There is exudative tissue in wound beds. No surrounding erythema. there is no drainage no odor no sign of infection.   Imaging: No results found.  Labs: Lab Results  Component Value Date   HGBA1C 5.4 07/23/2016   HGBA1C 5.3 02/09/2016   HGBA1C 4.4 12/30/2015   ESRSEDRATE 25 (H) 07/22/2016   ESRSEDRATE 11 04/12/2016   ESRSEDRATE 61 (H) 08/13/2015   CRP 1.7 (H) 07/22/2016   CRP 9.6 (H) 08/13/2015   CRP 19.5 (H) 08/10/2015   LABURIC 6.6 07/23/2016   LABURIC 7.3 08/13/2015   LABURIC 9.4 (H) 08/09/2015   REPTSTATUS 07/25/2016 FINAL 07/24/2016   GRAMSTAIN  08/13/2015    RARE WBC PRESENT, PREDOMINANTLY PMN NO ORGANISMS SEEN [facility]formed at [facility]    GRAMSTAIN  08/13/2015    RARE WBC PRESENT, PREDOMINANTLY PMN NO  ORGANISMS SEEN Performed at [facility]    CULT NO GROWTH 07/24/2016   LABORGA STAPHYLOCOCCUS AUREUS 08/13/2015    Orders:  No orders of the defined types were placed in this encounter.  No orders of the defined types were placed in this encounter.    Procedures: No procedures performed  Clinical Data: No additional findings.  ROS:  All other systems negative, except as noted in the HPI. Review of Systems  Constitutional: Negative for chills and fever.  Cardiovascular: Positive for leg swelling.  Skin: Positive for wound. Negative for color change.    Objective: Vital Signs: There were no vitals taken for this visit.  Specialty Comments:  No specialty comments available.  PMFS History: Patient Active Problem List   Diagnosis Date Noted  . S/P transmetatarsal amputation of foot, left (HCC) 10/08/2016  . PAD (peripheral artery disease) (HCC) 09/15/2016  . Edema 05/05/2016  . Phantom pain (HCC) 03/08/2016  . Gout 03/08/2016  . Hypothyroidism (acquired) 03/08/2016  . CKD (chronic kidney disease) stage 3, GFR 30-59 ml/min 03/08/2016  . GERD without esophagitis 03/08/2016  . Constipation 12/29/2015  . Below knee amputation status (HCC) 10/01/2015  . Ankle pain 08/13/2015  . Adult failure to thrive   . PVD (peripheral vascular disease) (HCC)   . Elevated blood uric acid level 08/10/2015  .  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 Anemia of chronic disease 08/10/2015  . Malnutrition of moderate degree 06/24/2015  . Failure to thrive in adult 06/23/2015  . Weight loss 06/23/2015  . Tobacco abuse 06/23/2015  . COPD (chronic obstructive pulmonary disease) (HCC) 06/23/2015  . Fall   . Critical lower limb ischemia 05/31/2014  . Status post right foot surgery 03/12/2014   Past Medical History:  Diagnosis Date  . Adult failure to thrive    /notes 06/23/2015  . Anemia   . Arthritis    "hands, fingers" (11/10/2016  . COPD (chronic obstructive pulmonary  disease) (HCC)    LONG TIME SMOKER  . Critical lower limb ischemia   . 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 Depression    "periods of depression" (06/23/2015)  . Gout   . Hypothyroidism   . PAD (peripheral artery disease) (HCC)   . Physical deconditioning   . Pyelonephritis 06/23/2015  . Status post foot surgery    right fifth toe amputation by Dr. Lajoyce Corners     Family History  Problem Relation Age of Onset  . Heart failure Father   . Heart attack Father   . Heart failure Brother     Past Surgical History:  Procedure Laterality Date  . ABDOMINAL AORTOGRAM W/LOWER EXTREMITY N/A 09/03/2016   Procedure: Abdominal Aortogram w/Lower Extremity;  Surgeon: Sherren Kerns, MD;  Location: [facility] INVASIVE CV LAB;  Service: Cardiovascular;  Laterality: N/A;  Lt. leg  . ABDOMINAL AORTOGRAM W/LOWER EXTREMITY N/A 11/10/2016   Procedure: Abdominal Aortogram w/Lower Extremity;  Surgeon: Maeola Harman, MD;  Location: [facility] INVASIVE CV LAB;  Service: Cardiovascular;  Laterality: N/A;  . AMPUTATION Right 03/01/2014   Procedure: AMPUTATION RAY;  Surgeon: Nadara Mustard, MD;  Location: [facility] OR;  Service: Orthopedics;  Laterality: Right;  Right Foot 5th Ray Amputation  . AMPUTATION Right 04/26/2014   Procedure: Right Foot 4th Ray Amputation;  Surgeon: Nadara Mustard, MD;  Location: [facility] OR;  Service: Orthopedics;  Laterality: Right;  . AMPUTATION Right 10/01/2015   Procedure: Right Below Knee Amputation;  Surgeon: Nadara Mustard, MD;  Location: [facility] OR;  Service: Orthopedics;  Laterality: Right;  . AMPUTATION Left 10/08/2016   Procedure: Left Transmetatarsal Amputation;  Surgeon: Nadara Mustard, MD;  Location: [facility] OR;  Service: Orthopedics;  Laterality: Left;  . AMPUTATION Left 07/23/2016   Procedure: Left 2nd Toe Amputation;  Surgeon: Nadara Mustard, MD;  Location: [facility] OR;  Service: Orthopedics;  Laterality: Left;  . ANKLE ARTHROSCOPY Right 08/13/2015   Procedure: ANKLE ARTHROSCOPY;   Surgeon: Nadara Mustard, MD;  Location: [facility] OR;  Service: Orthopedics;  Laterality: Right;  . CATARACT EXTRACTION W/ INTRAOCULAR LENS  IMPLANT, BILATERAL Bilateral   . INGUINAL HERNIA REPAIR Left   . KNEE CARTILAGE SURGERY Left 1960's   football injury  . KNEE LIGAMENT RECONSTRUCTION Left 1960's  . LOWER EXTREMITY ANGIOGRAM N/A 05/27/2014   Procedure: LOWER EXTREMITY ANGIOGRAM;  Surgeon: Runell Gess, MD;  Location: [facility] CATH LAB;  Service: Cardiovascular;  Laterality: N/A;  . LOWER EXTREMITY ANGIOGRAPHY Left 09/15/2016   Procedure: Lower Extremity Angiography;  Surgeon: Maeola Harman, MD;  Location: [facility] INVASIVE CV LAB;  Service: Cardiovascular;  Laterality: Left;  . PERIPHERAL VASCULAR ATHERECTOMY  11/10/2016   Procedure: Peripheral Vascular Atherectomy;  Surgeon: Maeola Harman, MD;  Location: [facility] INVASIVE CV LAB;  Service: Cardiovascular;;  . PERIPHERAL VASCULAR INTERVENTION Left 09/15/2016  Procedure: Peripheral Vascular Intervention;  Surgeon: Maeola Harman, MD;  Location: [facility] INVASIVE CV LAB;  Service: Cardiovascular;  Laterality: Left;  SFA/popiteal  . PERIPHERAL VASCULAR INTERVENTION  11/10/2016   Hattie Perch 11/10/2016  . PERIPHERAL VASCULAR INTERVENTION  11/10/2016   Procedure: Peripheral Vascular Intervention;  Surgeon: Maeola Harman, MD;  Location: [facility] INVASIVE CV LAB;  Service: Cardiovascular;;  . REFRACTIVE SURGERY Bilateral   . STUMP REVISION Right 12/05/2015   Procedure: Revision Right Below Knee Amputation;  Surgeon: Nadara Mustard, MD;  Location: [facility] OR;  Service: Orthopedics;  Laterality: Right;  . STUMP REVISION Left 11/19/2016   Procedure: Revision Left Transmetatarsal Amputation;  Surgeon: Nadara Mustard, MD;  Location: [facility] OR;  Service: Orthopedics;  Laterality: Left;   Social History   Occupational History  . Not on file.   Social History Main Topics  . Smoking status: Former Smoker    Packs/day: 1.00    Years: 57.00    Types:  Cigarettes    Quit date: 03/19/2016  . Smokeless tobacco: Never Used  . Alcohol use Yes     Comment: 11/10/2016 I was drinking alot of beer; quit in 2015"   . Drug use: No  . Sexual activity: No

## 2016-12-28 NOTE — Addendum Note (Signed)
Addended by: Burton ApleyPETTY, Darica Goren A on: 12/28/2016 03:11 PM   Modules accepted: Orders

## 2016-12-30 ENCOUNTER — Ambulatory Visit (HOSPITAL_COMMUNITY)
Admission: RE | Admit: 2016-12-30 | Discharge: 2016-12-30 | Disposition: A | Discharge status: Home / Self Care | Payer: Medicare HMO | Source: Ambulatory Visit | Attending: Vascular Surgery | Admitting: Vascular Surgery

## 2016-12-30 ENCOUNTER — Ambulatory Visit (INDEPENDENT_AMBULATORY_CARE_PROVIDER_SITE_OTHER)
Admission: RE | Admit: 2016-12-30 | Discharge: 2016-12-30 | Disposition: A | Discharge status: Home / Self Care | Payer: Medicare HMO | Source: Ambulatory Visit | Attending: Vascular Surgery | Admitting: Vascular Surgery

## 2016-12-30 DIAGNOSIS — Z95828 Presence of other vascular implants and grafts: Secondary | ICD-10-CM | POA: Diagnosis not present

## 2016-12-30 DIAGNOSIS — I739 Peripheral vascular disease, unspecified: Secondary | ICD-10-CM | POA: Diagnosis not present

## 2016-12-30 DIAGNOSIS — Z89511 Acquired absence of right leg below knee: Secondary | ICD-10-CM | POA: Insufficient documentation

## 2016-12-30 DIAGNOSIS — Z89422 Acquired absence of other left toe(s): Secondary | ICD-10-CM | POA: Diagnosis not present

## 2016-12-31 ENCOUNTER — Encounter: Payer: Medicare HMO | Admitting: Vascular Surgery

## 2016-12-31 NOTE — Progress Notes (Signed)
ERROR

## 2017-01-05 ENCOUNTER — Ambulatory Visit (INDEPENDENT_AMBULATORY_CARE_PROVIDER_SITE_OTHER): Payer: Medicare HMO | Admitting: Orthopedic Surgery

## 2017-01-05 ENCOUNTER — Ambulatory Visit (INDEPENDENT_AMBULATORY_CARE_PROVIDER_SITE_OTHER): Payer: Medicare HMO | Admitting: Vascular Surgery

## 2017-01-05 ENCOUNTER — Encounter: Payer: Self-pay | Admitting: Vascular Surgery

## 2017-01-05 ENCOUNTER — Encounter (INDEPENDENT_AMBULATORY_CARE_PROVIDER_SITE_OTHER): Payer: Self-pay | Admitting: Orthopedic Surgery

## 2017-01-05 VITALS — BP 130/72 | HR 71 | Temp 98.3°F | Resp 16 | Ht 76.0 in | Wt 158.0 lb

## 2017-01-05 VITALS — Ht 76.0 in | Wt 158.0 lb

## 2017-01-05 DIAGNOSIS — Z89432 Acquired absence of left foot: Secondary | ICD-10-CM

## 2017-01-05 DIAGNOSIS — I70229 Atherosclerosis of native arteries of extremities with rest pain, unspecified extremity: Secondary | ICD-10-CM

## 2017-01-05 DIAGNOSIS — I739 Peripheral vascular disease, unspecified: Secondary | ICD-10-CM | POA: Diagnosis not present

## 2017-01-05 DIAGNOSIS — I998 Other disorder of circulatory system: Secondary | ICD-10-CM | POA: Diagnosis not present

## 2017-01-05 NOTE — Addendum Note (Signed)
Addended by: Burton ApleyPETTY, Kento Gossman A on: 01/05/2017 04:42 PM   Modules accepted: Orders

## 2017-01-05 NOTE — Progress Notes (Signed)
Office Visit Note   Patient: Caleb Hays           Date of Birth: 1940-12-01           MRN: [id number] Visit Date: 01/05/2017              Requested by: Oneal Grout, MD [address] [place], [place] [address] PCP: Oneal Grout, MD  Chief Complaint  Patient presents with  . Left Foot - Routine Post Op    11/19/16 revision left transmet amputation       HPI: Patient is a 76 year old gentleman status post right transtibial amputation and status post a left transmetatarsal amputation approximately 2 months ago. Most recently he has status post endovascular work with Dr. Randie Heinz with revascularization to the left lower extremity.  Assessment & Plan: Visit Diagnoses:  1. S/P transmetatarsal amputation of foot, left (HCC)     Plan: Patient is given a prescription for [employer] for a extra-depth shoe custom orthotic spacer carbon plate in double upright brace as for the left leg.  Follow-Up Instructions: Return in about 3 months (around 04/07/2017).   Ortho Exam  Patient is alert, oriented, no adenopathy, well-dressed, normal affect, normal respiratory effort. Examination the transmetatarsal amputation has completely healed he does have venous stasis swelling the importance of him wearing his compression socks was discussed he should wear these daily.  Imaging: No results found.  Labs: Lab Results  Component Value Date   HGBA1C 5.4 07/23/2016   HGBA1C 5.3 02/09/2016   HGBA1C 4.4 12/30/2015   ESRSEDRATE 25 (H) 07/22/2016   ESRSEDRATE 11 04/12/2016   ESRSEDRATE 61 (H) 08/13/2015   CRP 1.7 (H) 07/22/2016   CRP 9.6 (H) 08/13/2015   CRP 19.5 (H) 08/10/2015   LABURIC 6.6 07/23/2016   LABURIC 7.3 08/13/2015   LABURIC 9.4 (H) 08/09/2015   REPTSTATUS 07/25/2016 FINAL 07/24/2016   GRAMSTAIN  08/13/2015    RARE WBC PRESENT, PREDOMINANTLY PMN NO ORGANISMS SEEN Performed at [facility]    GRAMSTAIN  08/13/2015    RARE WBC PRESENT, PREDOMINANTLY PMN NO ORGANISMS  SEEN Performed at [facility]    CULT NO GROWTH 07/24/2016   LABORGA STAPHYLOCOCCUS AUREUS 08/13/2015    Orders:  No orders of the defined types were placed in this encounter.  No orders of the defined types were placed in this encounter.    Procedures: No procedures performed  Clinical Data: No additional findings.  ROS:  All other systems negative, except as noted in the HPI. Review of Systems  Objective: Vital Signs: Ht 6\' 4"  (1.93 m)   Wt 158 lb (71.7 kg)   BMI 19.23 kg/m   Specialty Comments:  No specialty comments available.  PMFS History: Patient Active Problem List   Diagnosis Date Noted  . S/P transmetatarsal amputation of foot, left (HCC) 10/08/2016  . PAD (peripheral artery disease) (HCC) 09/15/2016  . Edema 05/05/2016  . Phantom pain (HCC) 03/08/2016  . Gout 03/08/2016  . Hypothyroidism (acquired) 03/08/2016  . CKD (chronic kidney disease) stage 3, GFR 30-59 ml/min 03/08/2016  . GERD without esophagitis 03/08/2016  . Constipation 12/29/2015  . Below knee amputation status (HCC) 10/01/2015  . Ankle pain 08/13/2015  . Adult failure to thrive   . PVD (peripheral vascular disease) (HCC)   . Elevated blood uric acid level 08/10/2015  . 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 Anemia of chronic disease 08/10/2015  . Malnutrition of moderate degree 06/24/2015  . Failure to thrive in  adult 06/23/2015  . Weight loss 06/23/2015  . Tobacco abuse 06/23/2015  . COPD (chronic obstructive pulmonary disease) (HCC) 06/23/2015  . Fall   . Critical lower limb ischemia 05/31/2014  . Status post right foot surgery 03/12/2014   Past Medical History:  Diagnosis Date  . Adult failure to thrive    /notes 06/23/2015  . Anemia   . Arthritis    "hands, fingers" (11/10/2016  . COPD (chronic obstructive pulmonary disease) (HCC)    LONG TIME SMOKER  . Critical lower limb ischemia   . 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  Depression    "periods of depression" (06/23/2015)  . Gout   . Hypothyroidism   . PAD (peripheral artery disease) (HCC)   . Physical deconditioning   . Pyelonephritis 06/23/2015  . Status post foot surgery    right fifth toe amputation by Dr. Lajoyce Corners     Family History  Problem Relation Age of Onset  . Heart failure Father   . Heart attack Father   . Heart failure Brother     Past Surgical History:  Procedure Laterality Date  . ABDOMINAL AORTOGRAM W/LOWER EXTREMITY N/A 09/03/2016   Procedure: Abdominal Aortogram w/Lower Extremity;  Surgeon: Sherren Kerns, MD;  Location: [facility] INVASIVE CV LAB;  Service: Cardiovascular;  Laterality: N/A;  Lt. leg  . ABDOMINAL AORTOGRAM W/LOWER EXTREMITY N/A 11/10/2016   Procedure: Abdominal Aortogram w/Lower Extremity;  Surgeon: Maeola Harman, MD;  Location: [facility] INVASIVE CV LAB;  Service: Cardiovascular;  Laterality: N/A;  . AMPUTATION Right 03/01/2014   Procedure: AMPUTATION RAY;  Surgeon: Nadara Mustard, MD;  Location: [facility] OR;  Service: Orthopedics;  Laterality: Right;  Right Foot 5th Ray Amputation  . AMPUTATION Right 04/26/2014   Procedure: Right Foot 4th Ray Amputation;  Surgeon: Nadara Mustard, MD;  Location: [facility] OR;  Service: Orthopedics;  Laterality: Right;  . AMPUTATION Right 10/01/2015   Procedure: Right Below Knee Amputation;  Surgeon: Nadara Mustard, MD;  Location: [facility] OR;  Service: Orthopedics;  Laterality: Right;  . AMPUTATION Left 10/08/2016   Procedure: Left Transmetatarsal Amputation;  Surgeon: Nadara Mustard, MD;  Location: [facility] OR;  Service: Orthopedics;  Laterality: Left;  . AMPUTATION Left 07/23/2016   Procedure: Left 2nd Toe Amputation;  Surgeon: Nadara Mustard, MD;  Location: [facility] OR;  Service: Orthopedics;  Laterality: Left;  . ANKLE ARTHROSCOPY Right 08/13/2015   Procedure: ANKLE ARTHROSCOPY;  Surgeon: Nadara Mustard, MD;  Location: [facility] OR;  Service: Orthopedics;  Laterality: Right;  . CATARACT EXTRACTION W/ INTRAOCULAR LENS  IMPLANT,  BILATERAL Bilateral   . INGUINAL HERNIA REPAIR Left   . KNEE CARTILAGE SURGERY Left 1960's   football injury  . KNEE LIGAMENT RECONSTRUCTION Left 1960's  . LOWER EXTREMITY ANGIOGRAM N/A 05/27/2014   Procedure: LOWER EXTREMITY ANGIOGRAM;  Surgeon: Runell Gess, MD;  Location: [facility] CATH LAB;  Service: Cardiovascular;  Laterality: N/A;  . LOWER EXTREMITY ANGIOGRAPHY Left 09/15/2016   Procedure: Lower Extremity Angiography;  Surgeon: Maeola Harman, MD;  Location: [facility] INVASIVE CV LAB;  Service: Cardiovascular;  Laterality: Left;  . PERIPHERAL VASCULAR ATHERECTOMY  11/10/2016   Procedure: Peripheral Vascular Atherectomy;  Surgeon: Maeola Harman, MD;  Location: [facility] INVASIVE CV LAB;  Service: Cardiovascular;;  . PERIPHERAL VASCULAR INTERVENTION Left 09/15/2016   Procedure: Peripheral Vascular Intervention;  Surgeon: Maeola Harman, MD;  Location: [facility] INVASIVE CV LAB;  Service: Cardiovascular;  Laterality: Left;  SFA/popiteal  .  PERIPHERAL VASCULAR INTERVENTION  11/10/2016   Hattie Perch/notes 11/10/2016  . PERIPHERAL VASCULAR INTERVENTION  11/10/2016   Procedure: Peripheral Vascular Intervention;  Surgeon: Maeola HarmanBrandon Christopher Cain, MD;  Location: [facility] INVASIVE CV LAB;  Service: Cardiovascular;;  . REFRACTIVE SURGERY Bilateral   . STUMP REVISION Right 12/05/2015   Procedure: Revision Right Below Knee Amputation;  Surgeon: Nadara MustardMarcus Ambika Zettlemoyer V, MD;  [facility] OR;  Service: Orthopedics;  Laterality: Right;  . STUMP REVISION Left 11/19/2016   Procedure: Revision Left Transmetatarsal Amputation;  Surgeon: Nadara Mustarduda, Gusta Marksberry V, MD;  [facility];  Service: Orthopedics;  Laterality: Left;   Social History   Occupational History  . Not on file.   Social History Main Topics  . Smoking status: Former Smoker    Packs/day: 1.00    Years: 57.00    Types: Cigarettes    Quit date: 03/19/2016  . Smokeless tobacco: Never Used  . Alcohol use Yes     Comment: 11/10/2016 I was drinking alot of beer; quit in  2015"   . Drug use: No  . Sexual activity: No

## 2017-01-05 NOTE — Progress Notes (Signed)
Patient ID: Caleb Hays, male   DOB: Aug 11, 1940, 76 y.o.   MRN: [id number]  Reason for Consult: PVD   Referred by Oneal Grout, MD  Subjective:     HPI:  Caleb Hays is a 76 y.o. male with history of retrograde revascularization of an SFA and popliteal arteries that then occluded and he recently had atherectomy of his in-stent stenosis and restenting. 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 He is not having any pain in his left leg at this time and is overall doing well.  Past Medical History:  Diagnosis Date  . Adult failure to thrive    /notes 06/23/2015  . Anemia   . Arthritis    "hands, fingers" (11/10/2016  . COPD (chronic obstructive pulmonary disease) (HCC)    LONG TIME SMOKER  . Critical lower limb ischemia   . 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 Depression    "periods of depression" (06/23/2015)  . Gout   . Hypothyroidism   . PAD (peripheral artery disease) (HCC)   . Physical deconditioning   . Pyelonephritis 06/23/2015  . Status post foot surgery    right fifth toe amputation by Dr. Lajoyce Corners    Family History  Problem Relation Age of Onset  . Heart failure Father   . Heart attack Father   . Heart failure Brother    Past Surgical History:  Procedure Laterality Date  . ABDOMINAL AORTOGRAM W/LOWER EXTREMITY N/A 09/03/2016   Procedure: Abdominal Aortogram w/Lower Extremity;  Surgeon: Sherren Kerns, MD;  Location: [facility] INVASIVE CV LAB;  Service: Cardiovascular;  Laterality: N/A;  Lt. leg  . ABDOMINAL AORTOGRAM W/LOWER EXTREMITY N/A 11/10/2016   Procedure: Abdominal Aortogram w/Lower Extremity;  Surgeon: Maeola Harman, MD;  Location: [facility] INVASIVE CV LAB;  Service: Cardiovascular;  Laterality: N/A;  . AMPUTATION Right 03/01/2014   Procedure: AMPUTATION RAY;  Surgeon: Nadara Mustard, MD;  Location: [facility] OR;  Service: Orthopedics;  Laterality: Right;  Right Foot  5th Ray Amputation  . AMPUTATION Right 04/26/2014   Procedure: Right Foot 4th Ray Amputation;  Surgeon: Nadara Mustard, MD;  Location: [facility] OR;  Service: Orthopedics;  Laterality: Right;  . AMPUTATION Right 10/01/2015   Procedure: Right Below Knee Amputation;  Surgeon: Nadara Mustard, MD;  Location: [facility] OR;  Service: Orthopedics;  Laterality: Right;  . AMPUTATION Left 10/08/2016   Procedure: Left Transmetatarsal Amputation;  Surgeon: Nadara Mustard, MD;  Location: [facility] OR;  Service: Orthopedics;  Laterality: Left;  . AMPUTATION Left 07/23/2016   Procedure: Left 2nd Toe Amputation;  Surgeon: Nadara Mustard, MD;  Location: [facility] OR;  Service: Orthopedics;  Laterality: Left;  . ANKLE ARTHROSCOPY Right 08/13/2015   Procedure: ANKLE ARTHROSCOPY;  Surgeon: Nadara Mustard, MD;  Location: [facility] OR;  Service: Orthopedics;  Laterality: Right;  . CATARACT EXTRACTION W/ INTRAOCULAR LENS  IMPLANT, BILATERAL Bilateral   . INGUINAL HERNIA REPAIR Left   . KNEE CARTILAGE SURGERY Left 1960's   football injury  . KNEE LIGAMENT RECONSTRUCTION Left 1960's  . LOWER EXTREMITY ANGIOGRAM N/A 05/27/2014   Procedure: LOWER EXTREMITY ANGIOGRAM;  Surgeon: Runell Gess, MD;  Location: [facility] CATH LAB;  Service: Cardiovascular;  Laterality: N/A;  . LOWER EXTREMITY ANGIOGRAPHY Left 09/15/2016   Procedure: Lower Extremity Angiography;  Surgeon: Maeola Harman, MD;  Location: [facility] INVASIVE CV LAB;  Service: Cardiovascular;  Laterality: Left;  .  PERIPHERAL VASCULAR ATHERECTOMY  11/10/2016   Procedure: Peripheral Vascular Atherectomy;  Surgeon: Maeola Harman, MD;  Location: [facility] INVASIVE CV LAB;  Service: Cardiovascular;;  . PERIPHERAL VASCULAR INTERVENTION Left 09/15/2016   Procedure: Peripheral Vascular Intervention;  Surgeon: Maeola Harman, MD;  Location: [facility] INVASIVE CV LAB;  Service: Cardiovascular;  Laterality: Left;  SFA/popiteal  . PERIPHERAL VASCULAR INTERVENTION  11/10/2016   Hattie Perch 11/10/2016  . PERIPHERAL  VASCULAR INTERVENTION  11/10/2016   Procedure: Peripheral Vascular Intervention;  Surgeon: Maeola Harman, MD;  Location: [facility] INVASIVE CV LAB;  Service: Cardiovascular;;  . REFRACTIVE SURGERY Bilateral   . STUMP REVISION Right 12/05/2015   Procedure: Revision Right Below Knee Amputation;  Surgeon: Nadara Mustard, MD;  Location: [facility] OR;  Service: Orthopedics;  Laterality: Right;  . STUMP REVISION Left 11/19/2016   Procedure: Revision Left Transmetatarsal Amputation;  Surgeon: Nadara Mustard, MD;  Location: [facility] OR;  Service: Orthopedics;  Laterality: Left;    Short Social History:  Social History  Substance Use Topics  . Smoking status: Former Smoker    Packs/day: 1.00    Years: 57.00    Types: Cigarettes    Quit date: 03/19/2016  . Smokeless tobacco: Never Used  . Alcohol use Yes     Comment: 11/10/2016 I was drinking alot of beer; quit in 2015"     No Known Allergies  Current Outpatient Prescriptions  Medication Sig Dispense Refill  . albuterol (PROVENTIL HFA;VENTOLIN HFA) 108 (90 BASE) MCG/ACT inhaler Inhale 2 puffs into the lungs 2 (two) times daily. 0900 & 1700    . allopurinol (ZYLOPRIM) 100 MG tablet Take 100 mg by mouth daily.     Marland Kitchen aspirin 81 MG chewable tablet Chew 81 mg by mouth daily.    . cholecalciferol (VITAMIN D) 1000 units tablet Take 1,000 Units by mouth daily. (0900)    . clopidogrel (PLAVIX) 75 MG tablet Take 1 tablet (75 mg total) by mouth daily with breakfast. 30 tablet 6  . DULoxetine (CYMBALTA) 20 MG capsule Take 20 mg by mouth daily.    . famotidine (PEPCID) 20 MG tablet Take 20 mg by mouth daily.     Marland Kitchen gabapentin (NEURONTIN) 100 MG capsule Take 200 mg by mouth at bedtime.     Marland Kitchen levothyroxine (SYNTHROID, LEVOTHROID) 25 MCG tablet Take 12.5 mcg by mouth daily at 6 (six) AM.     . loperamide (IMODIUM A-D) 2 MG tablet Take 2-4 mg by mouth every 4 (four) hours as needed for diarrhea or loose stools (Do not give if <3 stools or DX. w/c. diff or norovirus. Do not  exceed 16 mg/24hrs.).     Marland Kitchen meloxicam (MOBIC) 7.5 MG tablet Take 7.5 mg by mouth daily.     . methocarbamol (ROBAXIN) 500 MG tablet Take 500 mg by mouth 2 (two) times daily.     . Multiple Vitamins-Minerals (MULTIVITAMIN WITH MINERALS) tablet Take 1 tablet by mouth daily. (0900)    . omeprazole (PRILOSEC) 20 MG capsule Take 20 mg by mouth daily at 6 (six) AM.     . oxyCODONE (OXY IR/ROXICODONE) 5 MG immediate release tablet Take 1-2 tablets (5-10 mg total) by mouth every 4 (four) hours as needed (5mg  for moderate pain, 10mg  for severe pain). 15 tablet 0  . polyethylene glycol (MIRALAX / GLYCOLAX) packet Take 17 g by mouth daily. Hold for loose stool    . saccharomyces boulardii (FLORASTOR) 250 MG capsule Take 250 mg by mouth 2 (two) times daily.    Marland Kitchen  traZODone (DESYREL) 50 MG tablet Take 50 mg by mouth at bedtime.     Marland Kitchen. UNABLE TO FIND Take 90 mLs by mouth 2 (two) times daily. Med Name: Medpass     No current facility-administered medications for this visit.     Review of Systems  Constitutional:  Constitutional negative. Skin: Skin negative.  Hematologic: Hematologic/lymphatic negative.        Objective:  Objective   Vitals:   01/05/17 0858  BP: 130/72  Pulse: 71  Resp: 16  Temp: 98.3 F (36.8 C)  SpO2: (!) 83%  Weight: 158 lb (71.7 kg)  Height: 6\' 4"  (1.93 m)   Body mass index is 19.23 kg/m.  Physical Exam  Constitutional: He is oriented to person, place, and time. He appears well-developed.  HENT:  Head: Normocephalic.  Eyes: Pupils are equal, round, and reactive to light.  Cardiovascular:  Strong peroneal signal  Pulmonary/Chest: Effort normal.  Abdominal: Soft.  Musculoskeletal: Normal range of motion. He exhibits no edema.  Well healed left tma site  Neurological: He is alert and oriented to person, place, and time.  Skin: Skin is warm and dry.    Data: ABI on left 0.99 and monophasic.  Left lower extremity duplex demonstrates patent femoral and popliteal  artery stents.     Assessment/Plan:     76 year old male follows up from recent stenting of his left SFA and popliteal requiring atherectomy present stenosis and repeat stenting. This is allowed him to heal a transmetatarsal amputation. He is following with Dr. Lajoyce Cornersuda today and can follow-up with us in 3 months with repeat duplex and ABI.     Maeola HarmanBrandon Christopher Detrice Cales MD [facility]

## 2017-01-06 ENCOUNTER — Ambulatory Visit (INDEPENDENT_AMBULATORY_CARE_PROVIDER_SITE_OTHER): Payer: Medicare HMO | Admitting: Orthopedic Surgery

## 2017-03-17 ENCOUNTER — Encounter (HOSPITAL_COMMUNITY): Payer: Self-pay

## 2017-03-17 ENCOUNTER — Inpatient Hospital Stay (HOSPITAL_COMMUNITY)
Admission: EM | Admit: 2017-03-17 | Discharge: 2017-03-22 | DRG: 194 | Disposition: A | Discharge status: Skilled Nursing Facility | Payer: Medicare HMO | Attending: Family Medicine | Admitting: Family Medicine

## 2017-03-17 ENCOUNTER — Emergency Department (HOSPITAL_COMMUNITY): Payer: Medicare HMO

## 2017-03-17 ENCOUNTER — Other Ambulatory Visit: Payer: Self-pay

## 2017-03-17 DIAGNOSIS — G8929 Other chronic pain: Secondary | ICD-10-CM | POA: Diagnosis present

## 2017-03-17 DIAGNOSIS — J449 Chronic obstructive pulmonary disease, unspecified: Secondary | ICD-10-CM | POA: Diagnosis not present

## 2017-03-17 DIAGNOSIS — Z8249 Family history of ischemic heart disease and other diseases of the circulatory system: Secondary | ICD-10-CM

## 2017-03-17 DIAGNOSIS — J440 Chronic obstructive pulmonary disease with acute lower respiratory infection: Secondary | ICD-10-CM | POA: Diagnosis present

## 2017-03-17 DIAGNOSIS — Z7902 Long term (current) use of antithrombotics/antiplatelets: Secondary | ICD-10-CM

## 2017-03-17 DIAGNOSIS — J918 Pleural effusion in other conditions classified elsewhere: Secondary | ICD-10-CM | POA: Diagnosis present

## 2017-03-17 DIAGNOSIS — Z87891 Personal history of nicotine dependence: Secondary | ICD-10-CM | POA: Diagnosis not present

## 2017-03-17 DIAGNOSIS — I739 Peripheral vascular disease, unspecified: Secondary | ICD-10-CM | POA: Diagnosis present

## 2017-03-17 DIAGNOSIS — Y95 Nosocomial condition: Secondary | ICD-10-CM | POA: Diagnosis present

## 2017-03-17 DIAGNOSIS — Z89511 Acquired absence of right leg below knee: Secondary | ICD-10-CM | POA: Diagnosis not present

## 2017-03-17 DIAGNOSIS — J189 Pneumonia, unspecified organism: Secondary | ICD-10-CM | POA: Diagnosis present

## 2017-03-17 DIAGNOSIS — J90 Pleural effusion, not elsewhere classified: Secondary | ICD-10-CM | POA: Diagnosis present

## 2017-03-17 DIAGNOSIS — D509 Iron deficiency anemia, unspecified: Secondary | ICD-10-CM | POA: Diagnosis present

## 2017-03-17 DIAGNOSIS — I493 Ventricular premature depolarization: Secondary | ICD-10-CM | POA: Diagnosis present

## 2017-03-17 DIAGNOSIS — I2510 Atherosclerotic heart disease of native coronary artery without angina pectoris: Secondary | ICD-10-CM | POA: Diagnosis present

## 2017-03-17 DIAGNOSIS — E039 Hypothyroidism, unspecified: Secondary | ICD-10-CM | POA: Diagnosis not present

## 2017-03-17 DIAGNOSIS — R0602 Shortness of breath: Secondary | ICD-10-CM

## 2017-03-17 LAB — I-STAT TROPONIN, ED: TROPONIN I, POC: 0.01 ng/mL (ref 0.00–0.08)

## 2017-03-17 LAB — CBC WITH DIFFERENTIAL/PLATELET
BASOS ABS: 0 10*3/uL (ref 0.0–0.1)
BASOS PCT: 0 %
Band Neutrophils: 0 %
Blasts: 0 %
EOS ABS: 0 10*3/uL (ref 0.0–0.7)
Eosinophils Relative: 0 %
HCT: 31.2 % — ABNORMAL LOW (ref 39.0–52.0)
HEMOGLOBIN: 9.4 g/dL — AB (ref 13.0–17.0)
Lymphocytes Relative: 20 %
Lymphs Abs: 2 10*3/uL (ref 0.7–4.0)
MCH: 21.3 pg — ABNORMAL LOW (ref 26.0–34.0)
MCHC: 30.1 g/dL (ref 30.0–36.0)
MCV: 70.6 fL — ABNORMAL LOW (ref 78.0–100.0)
METAMYELOCYTES PCT: 0 %
MONO ABS: 1.1 10*3/uL — AB (ref 0.1–1.0)
MYELOCYTES: 0 %
Monocytes Relative: 11 %
NEUTROS PCT: 69 %
NRBC: 0 /100{WBCs}
Neutro Abs: 7.1 10*3/uL (ref 1.7–7.7)
Other: 0 %
PROMYELOCYTES ABS: 0 %
Platelets: 385 10*3/uL (ref 150–400)
RBC: 4.42 MIL/uL (ref 4.22–5.81)
RDW: 17.9 % — ABNORMAL HIGH (ref 11.5–15.5)
WBC: 10.2 10*3/uL (ref 4.0–10.5)

## 2017-03-17 LAB — BRAIN NATRIURETIC PEPTIDE: B NATRIURETIC PEPTIDE 5: 1406 pg/mL — AB (ref 0.0–100.0)

## 2017-03-17 LAB — RETICULOCYTES
RBC.: 4.62 MIL/uL (ref 4.22–5.81)
RETIC CT PCT: 1.9 % (ref 0.4–3.1)
Retic Count, Absolute: 87.8 10*3/uL (ref 19.0–186.0)

## 2017-03-17 LAB — COMPREHENSIVE METABOLIC PANEL
ALT: 8 U/L — AB (ref 17–63)
AST: 17 U/L (ref 15–41)
Albumin: 3 g/dL — ABNORMAL LOW (ref 3.5–5.0)
Alkaline Phosphatase: 56 U/L (ref 38–126)
Anion gap: 8 (ref 5–15)
BUN: 15 mg/dL (ref 6–20)
CALCIUM: 8.4 mg/dL — AB (ref 8.9–10.3)
CO2: 27 mmol/L (ref 22–32)
Chloride: 105 mmol/L (ref 101–111)
Creatinine, Ser: 1.14 mg/dL (ref 0.61–1.24)
Glucose, Bld: 114 mg/dL — ABNORMAL HIGH (ref 65–99)
Potassium: 3.7 mmol/L (ref 3.5–5.1)
Sodium: 140 mmol/L (ref 135–145)
Total Bilirubin: 0.5 mg/dL (ref 0.3–1.2)
Total Protein: 5.4 g/dL — ABNORMAL LOW (ref 6.5–8.1)

## 2017-03-17 MED ORDER — IOPAMIDOL (ISOVUE-300) INJECTION 61%
INTRAVENOUS | Status: AC
  Administered 2017-03-17: 75 mL
  Filled 2017-03-17: qty 75

## 2017-03-17 MED ORDER — ALLOPURINOL 100 MG PO TABS
100.0000 mg | ORAL_TABLET | Freq: Every day | ORAL | Status: DC
  Administered 2017-03-18 – 2017-03-22 (×5): 100 mg via ORAL
  Filled 2017-03-17 (×5): qty 1

## 2017-03-17 MED ORDER — VANCOMYCIN HCL 500 MG IV SOLR
500.0000 mg | Freq: Two times a day (BID) | INTRAVENOUS | Status: DC
  Administered 2017-03-18 – 2017-03-20 (×6): 500 mg via INTRAVENOUS
  Filled 2017-03-17 (×9): qty 500

## 2017-03-17 MED ORDER — CLOPIDOGREL BISULFATE 75 MG PO TABS
75.0000 mg | ORAL_TABLET | Freq: Every day | ORAL | Status: DC
  Administered 2017-03-18 – 2017-03-22 (×5): 75 mg via ORAL
  Filled 2017-03-17 (×5): qty 1

## 2017-03-17 MED ORDER — VANCOMYCIN HCL IN DEXTROSE 1-5 GM/200ML-% IV SOLN
1000.0000 mg | Freq: Once | INTRAVENOUS | Status: AC
  Administered 2017-03-17: 1000 mg via INTRAVENOUS
  Filled 2017-03-17: qty 200

## 2017-03-17 MED ORDER — DEXTROSE 5 % IV SOLN
2.0000 g | Freq: Two times a day (BID) | INTRAVENOUS | Status: DC
  Administered 2017-03-18 – 2017-03-20 (×6): 2 g via INTRAVENOUS
  Filled 2017-03-17 (×7): qty 2

## 2017-03-17 MED ORDER — VITAMIN D 1000 UNITS PO TABS
1000.0000 [IU] | ORAL_TABLET | Freq: Every day | ORAL | Status: DC
  Administered 2017-03-18 – 2017-03-22 (×5): 1000 [IU] via ORAL
  Filled 2017-03-17 (×5): qty 1

## 2017-03-17 MED ORDER — SODIUM CHLORIDE 0.9% FLUSH
3.0000 mL | Freq: Two times a day (BID) | INTRAVENOUS | Status: DC
  Administered 2017-03-17 – 2017-03-22 (×3): 3 mL via INTRAVENOUS

## 2017-03-17 MED ORDER — GABAPENTIN 100 MG PO CAPS
200.0000 mg | ORAL_CAPSULE | Freq: Every day | ORAL | Status: DC
  Administered 2017-03-17 – 2017-03-21 (×5): 200 mg via ORAL
  Filled 2017-03-17 (×5): qty 2

## 2017-03-17 MED ORDER — PANTOPRAZOLE SODIUM 40 MG PO TBEC
40.0000 mg | DELAYED_RELEASE_TABLET | Freq: Every day | ORAL | Status: DC
  Administered 2017-03-18 – 2017-03-22 (×5): 40 mg via ORAL
  Filled 2017-03-17 (×5): qty 1

## 2017-03-17 MED ORDER — DULOXETINE HCL 20 MG PO CPEP
20.0000 mg | ORAL_CAPSULE | Freq: Every day | ORAL | Status: DC
  Administered 2017-03-18 – 2017-03-22 (×5): 20 mg via ORAL
  Filled 2017-03-17 (×5): qty 1

## 2017-03-17 MED ORDER — METHOCARBAMOL 500 MG PO TABS
500.0000 mg | ORAL_TABLET | Freq: Two times a day (BID) | ORAL | Status: DC
  Administered 2017-03-17 – 2017-03-22 (×10): 500 mg via ORAL
  Filled 2017-03-17 (×10): qty 1

## 2017-03-17 MED ORDER — LEVOTHYROXINE SODIUM 25 MCG PO TABS
12.5000 ug | ORAL_TABLET | Freq: Every day | ORAL | Status: DC
  Administered 2017-03-18 – 2017-03-22 (×5): 12.5 ug via ORAL
  Filled 2017-03-17 (×5): qty 1

## 2017-03-17 MED ORDER — DEXTROSE 5 % IV SOLN
2.0000 g | Freq: Once | INTRAVENOUS | Status: AC
  Administered 2017-03-17: 2 g via INTRAVENOUS
  Filled 2017-03-17: qty 2

## 2017-03-17 MED ORDER — SODIUM CHLORIDE 0.9% FLUSH: 3.0000 mL | INTRAVENOUS | Status: DC | PRN

## 2017-03-17 MED ORDER — POLYETHYLENE GLYCOL 3350 17 G PO PACK
17.0000 g | PACK | Freq: Every day | ORAL | Status: DC
  Filled 2017-03-17 (×3): qty 1

## 2017-03-17 MED ORDER — FAMOTIDINE 20 MG PO TABS
20.0000 mg | ORAL_TABLET | Freq: Every day | ORAL | Status: DC
  Administered 2017-03-18 – 2017-03-22 (×5): 20 mg via ORAL
  Filled 2017-03-17 (×5): qty 1

## 2017-03-17 MED ORDER — ASPIRIN 81 MG PO CHEW
81.0000 mg | CHEWABLE_TABLET | Freq: Every day | ORAL | Status: DC
  Administered 2017-03-18 – 2017-03-22 (×5): 81 mg via ORAL
  Filled 2017-03-17 (×6): qty 1

## 2017-03-17 MED ORDER — ADULT MULTIVITAMIN W/MINERALS CH
1.0000 | ORAL_TABLET | Freq: Every day | ORAL | Status: DC
  Administered 2017-03-18 – 2017-03-22 (×5): 1 via ORAL
  Filled 2017-03-17 (×5): qty 1

## 2017-03-17 MED ORDER — SACCHAROMYCES BOULARDII 250 MG PO CAPS
250.0000 mg | ORAL_CAPSULE | Freq: Two times a day (BID) | ORAL | Status: DC
  Administered 2017-03-17 – 2017-03-22 (×10): 250 mg via ORAL
  Filled 2017-03-17 (×11): qty 1

## 2017-03-17 MED ORDER — MELOXICAM 7.5 MG PO TABS
7.5000 mg | ORAL_TABLET | Freq: Every day | ORAL | Status: DC
  Administered 2017-03-18 – 2017-03-22 (×5): 7.5 mg via ORAL
  Filled 2017-03-17 (×5): qty 1

## 2017-03-17 MED ORDER — ENOXAPARIN SODIUM 40 MG/0.4ML ~~LOC~~ SOLN
40.0000 mg | SUBCUTANEOUS | Status: DC
  Administered 2017-03-19 – 2017-03-22 (×4): 40 mg via SUBCUTANEOUS
  Filled 2017-03-17 (×5): qty 0.4

## 2017-03-17 MED ORDER — OXYCODONE HCL 5 MG PO TABS: 5.0000 mg | ORAL_TABLET | ORAL | Status: DC | PRN

## 2017-03-17 MED ORDER — TRAZODONE HCL 50 MG PO TABS
50.0000 mg | ORAL_TABLET | Freq: Every day | ORAL | Status: DC
  Administered 2017-03-17 – 2017-03-21 (×5): 50 mg via ORAL
  Filled 2017-03-17 (×5): qty 1

## 2017-03-17 MED ORDER — ALBUTEROL SULFATE (2.5 MG/3ML) 0.083% IN NEBU
2.5000 mg | INHALATION_SOLUTION | RESPIRATORY_TRACT | Status: DC | PRN
  Administered 2017-03-17 – 2017-03-18 (×3): 2.5 mg via RESPIRATORY_TRACT
  Filled 2017-03-17 (×6): qty 3

## 2017-03-17 MED ORDER — SODIUM CHLORIDE 0.9 % IV SOLN: 250.0000 mL | INTRAVENOUS | Status: DC | PRN

## 2017-03-17 NOTE — Progress Notes (Signed)
Pharmacy Antibiotic Note  Caleb MarinerJerald J Hays is a 76 y.o. male admitted on 03/17/2017 with pneumonia.   Plan: Cefepime 2g q12h Vancomycin 1 g x 1 then 500 mg q12h Monitor renal fx, cx, vt prn  Height: 6\' 4"  (193 cm) Weight: 150 lb (68 kg) IBW/kg (Calculated) : 86.8  Temp (24hrs), Avg:98.4 F (36.9 C), Min:98.4 F (36.9 C), Max:98.4 F (36.9 C)   Recent Labs Lab 03/17/17 1725  WBC 10.2  CREATININE 1.14    Estimated Creatinine Clearance: 53.8 mL/min (by C-G formula based on SCr of 1.14 mg/dL).    No Known Allergies  Isaac BlissMichael Markel Kurtenbach, PharmD, BCPS, BCCCP Clinical Pharmacist Clinical phone for 03/17/2017 from 7a-3:30p: [phone] If after 3:30p, please call main pharmacy at: [phone] 03/17/2017 9:06 PM

## 2017-03-17 NOTE — ED Notes (Signed)
Patient transported to X-ray 

## 2017-03-17 NOTE — ED Provider Notes (Signed)
Emergency Department Provider Note   I have reviewed the triage vital signs and the nursing notes.   HISTORY  Chief Complaint No chief complaint on file.   HPI Caleb Hays is a 76 y.o. male with PMH of COPD and PAD presents to the ED from [facility] for evaluation of abnormal heart rhythm, abnormal echocardiogram, worsening lower extremity edema, and ongoing pneumonia. The patient states that he's been on antibiotics for 2 months for a persistent pneumonia. He states he's had worsening lower extremity edema but denies any worsening shortness of breath. He reports that his physician at [facility] place order an echocardiogram which was done this morning. The results came back and he was transferred to the ED for further evaluation. Patient denies any fevers or chills. Denies chest pain. No abdominal discomfort. He reports that all medications are given to him and so is not sure exactly what is been taking. He is unsure what was abnormal regarding the ECHO and EMS was not provided any additional detail. No ECHO report sent with the patient.   I called to speak with the patient's nurse at [facility] who recently received sign on the patient but was not present during the decision to send him to the hospital. She is unsure what was abnormal about the echo. She was told he had new atrial fibrillation, heart failure, and ongoing pneumonia so was sent for further evaluation.    Past Medical History:  Diagnosis Date  . Adult failure to thrive    /notes 06/23/2015  . Anemia   . Arthritis    "hands, fingers" (11/10/2016  . COPD (chronic obstructive pulmonary disease) (HCC)    [name] [employer]MO[employer]  . Critical lower limb ischemia   . 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 Depression    "periods of depression" (06/23/2015)  . Gout   . Hypothyroidism   . PAD (peripheral artery disease) (HCC)   . Physical deconditioning   . Pyelonephritis 06/23/2015  . Status  post foot surgery    right fifth toe amputation by Dr. Lajoyce Corners     Patient Active Problem List   Diagnosis Date Noted  . HCAP (healthcare-associated pneumonia) 03/17/2017  . Loculated pleural effusion 03/17/2017  . Chronic pain 03/17/2017  . S/P transmetatarsal amputation of foot, left (HCC) 10/08/2016  . PAD (peripheral artery disease) (HCC) 09/15/2016  . Edema 05/05/2016  . Phantom pain (HCC) 03/08/2016  . Gout 03/08/2016  . Hypothyroidism 03/08/2016  . CKD (chronic kidney disease) stage 3, GFR 30-59 ml/min 03/08/2016  . GERD without esophagitis 03/08/2016  . Constipation 12/29/2015  . Below knee amputation status (HCC) 10/01/2015  . Ankle pain 08/13/2015  . Adult failure to thrive   . PVD (peripheral vascular disease) (HCC)   . Elevated blood uric acid level 08/10/2015  . 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 Anemia of chronic disease 08/10/2015  . Malnutrition of moderate degree 06/24/2015  . Failure to thrive in adult 06/23/2015  . Weight loss 06/23/2015  . Tobacco abuse 06/23/2015  . COPD (chronic obstructive pulmonary disease) (HCC) 06/23/2015  . Fall   . Critical lower limb ischemia 05/31/2014  . Status post right foot surgery 03/12/2014    Past Surgical History:  Procedure Laterality Date  . ABDOMINAL AORTOGRAM W/LOWER EXTREMITY N/A 09/03/2016   Procedure: Abdominal Aortogram w/Lower Extremity;  Surgeon: Sherren Kerns, MD;  Location: [facility] INVASIVE CV LAB;  Service: Cardiovascular;  Laterality: N/A;  Lt.  leg  . ABDOMINAL AORTOGRAM W/LOWER EXTREMITY N/A 11/10/2016   Procedure: Abdominal Aortogram w/Lower Extremity;  Surgeon: Maeola Harman, MD;  Location: [facility] INVASIVE CV LAB;  Service: Cardiovascular;  Laterality: N/A;  . AMPUTATION Right 03/01/2014   Procedure: AMPUTATION RAY;  Surgeon: Nadara Mustard, MD;  Location: [facility] OR;  Service: Orthopedics;  Laterality: Right;  Right Foot 5th Ray Amputation  . AMPUTATION Right 04/26/2014   Procedure: Right Foot 4th Ray  Amputation;  Surgeon: Nadara Mustard, MD;  Location: [facility] OR;  Service: Orthopedics;  Laterality: Right;  . AMPUTATION Right 10/01/2015   Procedure: Right Below Knee Amputation;  Surgeon: Nadara Mustard, MD;  Location: [facility] OR;  Service: Orthopedics;  Laterality: Right;  . AMPUTATION Left 10/08/2016   Procedure: Left Transmetatarsal Amputation;  Surgeon: Nadara Mustard, MD;  Location: [facility] OR;  Service: Orthopedics;  Laterality: Left;  . AMPUTATION Left 07/23/2016   Procedure: Left 2nd Toe Amputation;  Surgeon: Nadara Mustard, MD;  Location: [facility] OR;  Service: Orthopedics;  Laterality: Left;  . ANKLE ARTHROSCOPY Right 08/13/2015   Procedure: ANKLE ARTHROSCOPY;  Surgeon: Nadara Mustard, MD;  Location: [facility] OR;  Service: Orthopedics;  Laterality: Right;  . CATARACT EXTRACTION W/ INTRAOCULAR LENS  IMPLANT, BILATERAL Bilateral   . INGUINAL HERNIA REPAIR Left   . KNEE CARTILAGE SURGERY Left 1960's   football injury  . KNEE LIGAMENT RECONSTRUCTION Left 1960's  . LOWER EXTREMITY ANGIOGRAM N/A 05/27/2014   Procedure: LOWER EXTREMITY ANGIOGRAM;  Surgeon: Runell Gess, MD;  Location: [facility] CATH LAB;  Service: Cardiovascular;  Laterality: N/A;  . LOWER EXTREMITY ANGIOGRAPHY Left 09/15/2016   Procedure: Lower Extremity Angiography;  Surgeon: Maeola Harman, MD;  Location: [facility] INVASIVE CV LAB;  Service: Cardiovascular;  Laterality: Left;  . PERIPHERAL VASCULAR ATHERECTOMY  11/10/2016   Procedure: Peripheral Vascular Atherectomy;  Surgeon: Maeola Harman, MD;  Location: [facility] INVASIVE CV LAB;  Service: Cardiovascular;;  . PERIPHERAL VASCULAR INTERVENTION Left 09/15/2016   Procedure: Peripheral Vascular Intervention;  Surgeon: Maeola Harman, MD;  Location: [facility] INVASIVE CV LAB;  Service: Cardiovascular;  Laterality: Left;  SFA/popiteal  . PERIPHERAL VASCULAR INTERVENTION  11/10/2016   Hattie Perch 11/10/2016  . PERIPHERAL VASCULAR INTERVENTION  11/10/2016   Procedure: Peripheral Vascular Intervention;  Surgeon:  Maeola Harman, MD;  Location: [facility] INVASIVE CV LAB;  Service: Cardiovascular;;  . REFRACTIVE SURGERY Bilateral   . STUMP REVISION Right 12/05/2015   Procedure: Revision Right Below Knee Amputation;  Surgeon: Nadara Mustard, MD;  Location: [facility] OR;  Service: Orthopedics;  Laterality: Right;  . STUMP REVISION Left 11/19/2016   Procedure: Revision Left Transmetatarsal Amputation;  Surgeon: Nadara Mustard, MD;  Location: [facility] OR;  Service: Orthopedics;  Laterality: Left;      Allergies Patient has no known allergies.  Family History  Problem Relation Age of Onset  . Heart failure Father   . Heart attack Father   . Heart failure Brother     Social History Social History  Substance Use Topics  . Smoking status: Former Smoker    Packs/day: 1.00    Years: 57.00    Types: Cigarettes    Quit date: 03/19/2016  . Smokeless tobacco: Never Used  . Alcohol use Yes     Comment: 11/10/2016 I was drinking alot of beer; quit in 2015"     Review of Systems  Constitutional: No fever/chills Eyes: No visual changes. ENT: No sore throat. Cardiovascular: Denies chest pain. Respiratory: Positive shortness of breath.  Gastrointestinal: No abdominal pain.  No nausea, no vomiting.  No diarrhea.  No constipation. Genitourinary: Negative for dysuria. Musculoskeletal: Negative for back pain. Positive LE edema.  Skin: Negative for rash. Neurological: Negative for headaches, focal weakness or numbness.  10-point ROS otherwise negative.  ____________________________________________   PHYSICAL EXAM:  VITAL SIGNS: ED Triage Vitals  Enc Vitals Group     BP 03/17/17 1708 133/65     Pulse Rate 03/17/17 1708 (!) 59     Resp 03/17/17 1709 16     Temp 03/17/17 1709 98.4 F (36.9 C)     Temp Source 03/17/17 1709 Oral     SpO2 03/17/17 1708 94 %     Weight 03/17/17 1711 150 lb (68 kg)     Height 03/17/17 1711 6\' 4"  (1.93 m)    Constitutional: Alert and oriented. Well appearing and in no acute  distress. Eyes: Conjunctivae are normal. PERRL. EOMI. Head: Atraumatic. Nose: No congestion/rhinnorhea. Mouth/Throat: Mucous membranes are moist.  Oropharynx non-erythematous. Neck: No stridor. Cardiovascular: Normal rate, regular rhythm. Good peripheral circulation. Grossly normal heart sounds.   Respiratory: Increased respiratory effort.  No retractions. Lungs CTAB. Gastrointestinal: Soft and nontender. No distention.  Musculoskeletal: Right BKA with 2+ pitting edema. Left forefoot edema  Neurologic:  Normal speech and language. No gross focal neurologic deficits are appreciated.  Skin:  Skin is warm, dry and intact. No rash noted.  ____________________________________________   LABS (all labs ordered are listed, but only abnormal results are displayed)  Labs Reviewed  COMPREHENSIVE METABOLIC PANEL - Abnormal; Notable for the following:       Result Value   Glucose, Bld 114 (*)    Calcium 8.4 (*)    Total Protein 5.4 (*)    Albumin 3.0 (*)    ALT 8 (*)    All other components within normal limits  CBC WITH DIFFERENTIAL/PLATELET - Abnormal; Notable for the following:    Hemoglobin 9.4 (*)    HCT 31.2 (*)    MCV 70.6 (*)    MCH 21.3 (*)    RDW 17.9 (*)    Monocytes Absolute 1.1 (*)    All other components within normal limits  BRAIN NATRIURETIC PEPTIDE - Abnormal; Notable for the following:    B Natriuretic Peptide 1,406.0 (*)    All other components within normal limits  CULTURE, BLOOD (ROUTINE X 2)  CULTURE, BLOOD (ROUTINE X 2)  CULTURE, EXPECTORATED SPUTUM-ASSESSMENT  GRAM STAIN  RETICULOCYTES  STREP PNEUMONIAE URINARY ANTIGEN  LEGIONELLA PNEUMOPHILA SEROGP 1 UR AG  VITAMIN B12  FOLATE  IRON AND TIBC  FERRITIN  PROCALCITONIN  I-STAT TROPONIN, ED   ____________________________________________  EKG   EKG Interpretation  Date/Time:  Thursday March 17 2017 17:15:22 EDT Ventricular Rate:  77 PR Interval:    QRS Duration: 93 QT Interval:  436 QTC  Calculation: 447 R Axis:   84 Text Interpretation:  Borderline right axis deviation Low voltage, extremity leads Nonspecific T abnormalities, lateral leads Baseline wander in lead(s) V4 No STEMI.  Confirmed by Alona Bene [phone]) on 03/17/2017 5:36:33 PM       ____________________________________________  RADIOLOGY  Dg Chest 2 View  Result Date: 03/17/2017 CLINICAL DATA:  Recent pneumonia. EXAM: CHEST  2 VIEW COMPARISON:  June 23, 2015 FINDINGS: No pneumothorax. There is a rounded opacity in the lateral right lung base. There is an air-fluid level seen on frontal and lateral views. Cardiomegaly identified. The hila and mediastinum are unremarkable. No other suspicious findings. IMPRESSION: 1. There  is a rounded masslike opacity in the lateral right lung base with an air-fluid level along its superior margin. There is clearly a component of fluid. This could be sequela from recent infection. However a necrotic mass or loculated collection of pleural air and gas is possible. Recommend CT imaging for further evaluation. Findings called to Dr. Jacqulyn BathLong. Electronically Signed   By: Gerome Samavid  Williams III M.D   On: 03/17/2017 18:20   Ct Chest W Contrast  Result Date: 03/17/2017 CLINICAL DATA:  Shortness of breath. EXAM: CT CHEST WITH CONTRAST TECHNIQUE: Multidetector CT imaging of the chest was performed during intravenous contrast administration. CONTRAST:  75mL ISOVUE-300 IOPAMIDOL (ISOVUE-300) INJECTION 61% COMPARISON:  Radiographs of same day. FINDINGS: Cardiovascular: Atherosclerosis of thoracic aorta is noted without aneurysm or dissection. Normal cardiac size. No pericardial effusion is noted. Coronary artery calcifications are noted. Mediastinum/Nodes: No enlarged mediastinal, hilar, or axillary lymph nodes. Thyroid gland, trachea, and esophagus demonstrate no significant findings. Lungs/Pleura: Large possibly loculated right pleural effusion is noted. Mild subsegmental atelectasis is noted in the  right upper lobe. Small left pleural effusion is noted with associated subsegmental atelectasis. No pneumothorax is noted. Upper Abdomen: Severe splenomegaly is noted. Musculoskeletal: No chest wall abnormality. No acute or significant osseous findings. IMPRESSION: Coronary artery calcifications are noted suggesting coronary artery disease. Large probably loculated right pleural effusion is noted. Severe splenomegaly. Aortic Atherosclerosis (ICD10-I70.0). Electronically Signed   By: Lupita RaiderJames  Green Jr, M.D.   On: 03/17/2017 19:26    ____________________________________________   PROCEDURES  Procedure(s) performed:   Procedures  None ____________________________________________   INITIAL IMPRESSION / ASSESSMENT AND PLAN / ED COURSE  Pertinent labs & imaging results that were available during my care of the patient were reviewed by me and considered in my medical decision making (see chart for details).  Patient presents emergency department with worsening shortness of breath and LE edema and was told by SNF physician that his ECHO today was abnormal. No report arrived with the patient. Called to ask SNF to provide the ECHO report when able.   Radiology called with concern for air-fluid level on CXR. Recommend CT. Updated patient and will order CT.   CT shows loculated effusion. Starting abx. BNP also significantly elevated. Plan for admission.   08:23 PM Discussed patient's case with Hospitalist, Dr. Antionette Charpyd to request admission. Patient and family (if present) updated with plan. Care transferred to Hospitalist service.  I reviewed all nursing notes, vitals, pertinent old records, EKGs, labs, imaging (as available).  ____________________________________________  FINAL CLINICAL IMPRESSION(S) / ED DIAGNOSES  Final diagnoses:  Loculated pleural effusion     MEDICATIONS GIVEN DURING THIS VISIT:  Medications  DULoxetine (CYMBALTA) DR capsule 20 mg (not administered)  polyethylene  glycol (MIRALAX / GLYCOLAX) packet 17 g (not administered)  aspirin chewable tablet 81 mg (not administered)  oxyCODONE (Oxy IR/ROXICODONE) immediate release tablet 5-10 mg (not administered)  saccharomyces boulardii (FLORASTOR) capsule 250 mg (250 mg Oral Given 03/17/17 2340)  pantoprazole (PROTONIX) EC tablet 40 mg (not administered)  clopidogrel (PLAVIX) tablet 75 mg (not administered)  gabapentin (NEURONTIN) capsule 200 mg (200 mg Oral Given 03/17/17 2340)  allopurinol (ZYLOPRIM) tablet 100 mg (not administered)  levothyroxine (SYNTHROID, LEVOTHROID) tablet 12.5 mcg (not administered)  cholecalciferol (VITAMIN D) tablet 1,000 Units (not administered)  famotidine (PEPCID) tablet 20 mg (not administered)  multivitamin with minerals tablet 1 tablet (not administered)  methocarbamol (ROBAXIN) tablet 500 mg (500 mg Oral Given 03/17/17 2340)  traZODone (DESYREL) tablet 50 mg (50 mg  Oral Given 03/17/17 2340)  meloxicam (MOBIC) tablet 7.5 mg (not administered)  enoxaparin (LOVENOX) injection 40 mg (not administered)  sodium chloride flush (NS) 0.9 % injection 3 mL (3 mLs Intravenous Given by Other 03/17/17 2342)  sodium chloride flush (NS) 0.9 % injection 3 mL (not administered)  0.9 %  sodium chloride infusion (not administered)  albuterol (PROVENTIL) (2.5 MG/3ML) 0.083% nebulizer solution 2.5 mg (not administered)  ceFEPIme (MAXIPIME) 2 g in dextrose 5 % 50 mL IVPB (not administered)  vancomycin (VANCOCIN) 500 mg in sodium chloride 0.9 % 100 mL IVPB (not administered)  iopamidol (ISOVUE-300) 61 % injection (75 mLs  Contrast Given 03/17/17 1856)  ceFEPIme (MAXIPIME) 2 g in dextrose 5 % 50 mL IVPB (0 g Intravenous Stopped 03/17/17 2040)  vancomycin (VANCOCIN) IVPB 1000 mg/200 mL premix (0 mg Intravenous Stopped 03/17/17 2145)     NEW OUTPATIENT MEDICATIONS STARTED DURING THIS VISIT:  None  Note:  This document was prepared using Dragon voice recognition software and may include unintentional  dictation errors.  Alona Bene, MD Emergency Medicine    Abdishakur Gottschall, Arlyss Repress, MD 03/17/17 [phone]

## 2017-03-17 NOTE — H&P (Signed)
History and Physical    Caleb Hays ZOX:[id number] DOB: [birth year] DOA: 03/17/2017  PCP: Oneal Grout, MD   [facility] coming from: [facility]   Chief Complaint: SOB, cough   HPI: Caleb Hays is a 76 y.o. male with medical history significant for COPD, hypothyroidism, peripheral arterial disease, coronary artery disease, depression, and chronic pain, now presenting to the emergency department for evaluation of shortness of breath and cough. Patient reports that he has had a productive cough with dyspnea for the past 2 months, reports improving some initially when he was treated with Augmentin, but has since worsened. He denies fevers or chills, but reports dyspnea with minimal exertion and cough productive of thick white sputum. Denies any significant chest pain or palpitations. 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  ED Course: Upon arrival to the ED, patient is found to be afebrile, requiring 2 L/m supplemental oxygen in order to maintain saturations in the 90s, and with vitals otherwise stable. EKG features low-voltage QRS and PVCs. Chest x-ray is notable for a rounded masslike opacity in the lateral right lung base with air fluid level. This was followed by CT chest that demonstrates a large loculated right pleural effusion and incidental notation is made of severe splenomegaly. Chemistry panel was unremarkable and CBC is notable for a microcytic anemia with hemoglobin of 9.4 which appears stable, but worsening in his MCV to 70.6. BNP is elevated to 1406 and troponin is within normal limits. Patient was treated with empiric vancomycin and cefepime in the ED, remained hemodynamically stable and has not been in acute respiratory distress, and will be admitted to the telemetry unit for ongoing evaluation and management of shortness of breath with cough and large right pleural effusion.   Review of Systems:  All other systems reviewed and apart  from HPI, are negative.  Past Medical History:  Diagnosis Date  . Adult failure to thrive    /notes 06/23/2015  . Anemia   . Arthritis    "hands, fingers" (11/10/2016  . COPD (chronic obstructive pulmonary disease) (HCC)    LONG TIME SMOKER  . Critical lower limb ischemia   . 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 Depression    "periods of depression" (06/23/2015)  . Gout   . Hypothyroidism   . PAD (peripheral artery disease) (HCC)   . Physical deconditioning   . Pyelonephritis 06/23/2015  . Status post foot surgery    right fifth toe amputation by Dr. Lajoyce Corners     Past Surgical History:  Procedure Laterality Date  . ABDOMINAL AORTOGRAM W/LOWER EXTREMITY N/A 09/03/2016   Procedure: Abdominal Aortogram w/Lower Extremity;  Surgeon: Sherren Kerns, MD;  Location: [facility] INVASIVE CV LAB;  Service: Cardiovascular;  Laterality: N/A;  Lt. leg  . ABDOMINAL AORTOGRAM W/LOWER EXTREMITY N/A 11/10/2016   Procedure: Abdominal Aortogram w/Lower Extremity;  Surgeon: Maeola Harman, MD;  Location: [facility] INVASIVE CV LAB;  Service: Cardiovascular;  Laterality: N/A;  . AMPUTATION Right 03/01/2014   Procedure: AMPUTATION RAY;  Surgeon: Nadara Mustard, MD;  Location: [facility] OR;  Service: Orthopedics;  Laterality: Right;  Right Foot 5th Ray Amputation  . AMPUTATION Right 04/26/2014   Procedure: Right Foot 4th Ray Amputation;  Surgeon: Nadara Mustard, MD;  Location: [facility] OR;  Service: Orthopedics;  Laterality: Right;  . AMPUTATION Right 10/01/2015   Procedure: Right Below Knee Amputation;  Surgeon: Nadara Mustard, MD;  Location: [facility]  OR;  Service: Orthopedics;  Laterality: Right;  . AMPUTATION Left 10/08/2016   Procedure: Left Transmetatarsal Amputation;  Surgeon: Nadara Mustard, MD;  Location: [facility] OR;  Service: Orthopedics;  Laterality: Left;  . AMPUTATION Left 07/23/2016   Procedure: Left 2nd Toe Amputation;  Surgeon: Nadara Mustard, MD;  Location: [facility] OR;  Service: Orthopedics;   Laterality: Left;  . ANKLE ARTHROSCOPY Right 08/13/2015   Procedure: ANKLE ARTHROSCOPY;  Surgeon: Nadara Mustard, MD;  Location: [facility] OR;  Service: Orthopedics;  Laterality: Right;  . CATARACT EXTRACTION W/ INTRAOCULAR LENS  IMPLANT, BILATERAL Bilateral   . INGUINAL HERNIA REPAIR Left   . KNEE CARTILAGE SURGERY Left 1960's   football injury  . KNEE LIGAMENT RECONSTRUCTION Left 1960's  . LOWER EXTREMITY ANGIOGRAM N/A 05/27/2014   Procedure: LOWER EXTREMITY ANGIOGRAM;  Surgeon: Runell Gess, MD;  Location: [facility] CATH LAB;  Service: Cardiovascular;  Laterality: N/A;  . LOWER EXTREMITY ANGIOGRAPHY Left 09/15/2016   Procedure: Lower Extremity Angiography;  Surgeon: Maeola Harman, MD;  Location: [facility] INVASIVE CV LAB;  Service: Cardiovascular;  Laterality: Left;  . PERIPHERAL VASCULAR ATHERECTOMY  11/10/2016   Procedure: Peripheral Vascular Atherectomy;  Surgeon: Maeola Harman, MD;  Location: [facility] INVASIVE CV LAB;  Service: Cardiovascular;;  . PERIPHERAL VASCULAR INTERVENTION Left 09/15/2016   Procedure: Peripheral Vascular Intervention;  Surgeon: Maeola Harman, MD;  Location: [facility] INVASIVE CV LAB;  Service: Cardiovascular;  Laterality: Left;  SFA/popiteal  . PERIPHERAL VASCULAR INTERVENTION  11/10/2016   Hattie Perch 11/10/2016  . PERIPHERAL VASCULAR INTERVENTION  11/10/2016   Procedure: Peripheral Vascular Intervention;  Surgeon: Maeola Harman, MD;  Location: [facility] INVASIVE CV LAB;  Service: Cardiovascular;;  . REFRACTIVE SURGERY Bilateral   . STUMP REVISION Right 12/05/2015   Procedure: Revision Right Below Knee Amputation;  Surgeon: Nadara Mustard, MD;  Location: [facility] OR;  Service: Orthopedics;  Laterality: Right;  . STUMP REVISION Left 11/19/2016   Procedure: Revision Left Transmetatarsal Amputation;  Surgeon: Nadara Mustard, MD;  Location: [facility] OR;  Service: Orthopedics;  Laterality: Left;     reports that he quit smoking about a year ago. His smoking use included Cigarettes. He  has a 57.00 pack-year smoking history. He has never used smokeless tobacco. He reports that he drinks alcohol. He reports that he does not use drugs.  No Known Allergies  Family History  Problem Relation Age of Onset  . Heart failure Father   . Heart attack Father   . Heart failure Brother      Prior to Admission medications   Medication Sig Start Date End Date Taking? Authorizing Provider  albuterol (PROVENTIL HFA;VENTOLIN HFA) 108 (90 BASE) MCG/ACT inhaler Inhale 2 puffs into the lungs 2 (two) times daily. 0900 & 1700    [provider]  allopurinol (ZYLOPRIM) 100 MG tablet Take 100 mg by mouth daily.     [provider]  aspirin 81 MG chewable tablet Chew 81 mg by mouth daily.    [provider]  cholecalciferol (VITAMIN D) 1000 units tablet Take 1,000 Units by mouth daily. (0900)    [provider]  clopidogrel (PLAVIX) 75 MG tablet Take 1 tablet (75 mg total) by mouth daily with breakfast. 09/16/16   Raymond Gurney, PA-C  DULoxetine (CYMBALTA) 20 MG capsule Take 20 mg by mouth daily.    [provider]  famotidine (PEPCID) 20 MG tablet Take 20 mg by mouth daily.     [provider]  gabapentin (NEURONTIN) 100  MG capsule Take 200 mg by mouth at bedtime.     [provider]  levothyroxine (SYNTHROID, LEVOTHROID) 25 MCG tablet Take 12.5 mcg by mouth daily at 6 (six) AM.     [provider]  loperamide (IMODIUM A-D) 2 MG tablet Take 2-4 mg by mouth every 4 (four) hours as needed for diarrhea or loose stools (Do not give if <3 stools or DX. w/c. diff or norovirus. Do not exceed 16 mg/24hrs.).     [provider]  meloxicam (MOBIC) 7.5 MG tablet Take 7.5 mg by mouth daily.     [provider]  methocarbamol (ROBAXIN) 500 MG tablet Take 500 mg by mouth 2 (two) times daily.     [provider]  Multiple Vitamins-Minerals (MULTIVITAMIN WITH MINERALS) tablet Take 1 tablet by mouth daily. (0900)     [provider]  omeprazole (PRILOSEC) 20 MG capsule Take 20 mg by mouth daily at 6 (six) AM.     [provider]  oxyCODONE (OXY IR/ROXICODONE) 5 MG immediate release tablet Take 1-2 tablets (5-10 mg total) by mouth every 4 (four) hours as needed (5mg  for moderate pain, 10mg  for severe pain). 11/11/16   Lars Mage, PA-C  polyethylene glycol (MIRALAX / GLYCOLAX) packet Take 17 g by mouth daily. Hold for loose stool    [provider]  saccharomyces boulardii (FLORASTOR) 250 MG capsule Take 250 mg by mouth 2 (two) times daily.    [provider]  traZODone (DESYREL) 50 MG tablet Take 50 mg by mouth at bedtime.     [provider]  UNABLE TO FIND Take 90 mLs by mouth 2 (two) times daily. Med Name: Medpass    [provider]    Physical Exam: Vitals:   03/17/17 1845 03/17/17 1900 03/17/17 1945 03/17/17 2015  BP: 132/86 123/65 130/82 (!) 147/59  Pulse: (!) 107 69 66 69  Resp: 19 (!) 9 16 16   Temp:      TempSrc:      SpO2: 94% 100% 97% 98%  Weight:      Height:          Constitutional: NAD, calm, appears uncomfortable Eyes: PERTLA, lids and conjunctivae normal ENMT: Mucous membranes are moist. Posterior pharynx clear of any exudate or lesions.   Neck: normal, supple, no masses, no thyromegaly Respiratory: Diminished on right, slightly diminished on left. Dyspnea with speech. No accessory muscle use.  Cardiovascular: S1 & S2 heard, regular rate and rhythm. Trace edema about the BKA stump. No diaphoresis. Abdomen: No distension, no tenderness, no masses palpated. Bowel sounds active.  Musculoskeletal: no clubbing / cyanosis. Status-post right BKA, left TMA.   Skin: no significant rashes, lesions, ulcers. Warm, dry, well-perfused. Neurologic: CN 2-12 grossly intact. Sensation intact, DTR normal. Strength 5/5 in all 4 limbs.  Psychiatric: Alert and oriented x 3. Calm, cooperative.     Labs on Admission: I have personally reviewed  following labs and imaging studies  CBC:  Recent Labs Lab 03/17/17 1725  WBC 10.2  NEUTROABS 7.1  HGB 9.4*  HCT 31.2*  MCV 70.6*  PLT 385   Basic Metabolic Panel:  Recent Labs Lab 03/17/17 1725  NA 140  K 3.7  CL 105  CO2 27  GLUCOSE 114*  BUN 15  CREATININE 1.14  CALCIUM 8.4*   GFR: Estimated Creatinine Clearance: 53.8 mL/min (by C-G formula based on SCr of 1.14 mg/dL). Liver Function Tests:  Recent Labs Lab 03/17/17 1725  AST 17  ALT 8*  ALKPHOS 56  BILITOT 0.5  PROT 5.4*  ALBUMIN 3.0*   No results for input(s): LIPASE, AMYLASE in the last 168 hours. No results for input(s): AMMONIA in the last 168 hours. Coagulation Profile: No results for input(s): INR, PROTIME in the last 168 hours. Cardiac Enzymes: No results for input(s): CKTOTAL, CKMB, CKMBINDEX, TROPONINI in the last 168 hours. BNP (last 3 results) No results for input(s): PROBNP in the last 8760 hours. HbA1C: No results for input(s): HGBA1C in the last 72 hours. CBG: No results for input(s): GLUCAP in the last 168 hours. Lipid Profile: No results for input(s): CHOL, HDL, LDLCALC, TRIG, CHOLHDL, LDLDIRECT in the last 72 hours. Thyroid Function Tests: No results for input(s): TSH, T4TOTAL, FREET4, T3FREE, THYROIDAB in the last 72 hours. Anemia Panel: No results for input(s): VITAMINB12, FOLATE, FERRITIN, TIBC, IRON, RETICCTPCT in the last 72 hours. Urine analysis:    Component Value Date/Time   COLORURINE YELLOW 07/22/2016 1631   APPEARANCEUR CLOUDY (A) 07/22/2016 1631   LABSPEC 1.025 07/22/2016 1631   PHURINE 5.5 07/22/2016 1631   GLUCOSEU NEGATIVE 07/22/2016 1631   HGBUR MODERATE (A) 07/22/2016 1631   BILIRUBINUR NEGATIVE 07/22/2016 1631   KETONESUR NEGATIVE 07/22/2016 1631   PROTEINUR 30 (A) 07/22/2016 1631   UROBILINOGEN >8.0 (H) 05/14/2012 1805   NITRITE NEGATIVE 07/22/2016 1631   LEUKOCYTESUR LARGE (A) 07/22/2016 1631   Sepsis  Labs: @LABRCNTIP (procalcitonin:4,lacticidven:4) )No results found for this or any previous visit (from the past 240 hour(s)).   Radiological Exams on Admission: Dg Chest 2 View  Result Date: 03/17/2017 CLINICAL DATA:  Recent pneumonia. EXAM: CHEST  2 VIEW COMPARISON:  June 23, 2015 FINDINGS: No pneumothorax. There is a rounded opacity in the lateral right lung base. There is an air-fluid level seen on frontal and lateral views. Cardiomegaly identified. The hila and mediastinum are unremarkable. No other suspicious findings. IMPRESSION: 1. There is a rounded masslike opacity in the lateral right lung base with an air-fluid level along its superior margin. There is clearly a component of fluid. This could be sequela from recent infection. However a necrotic mass or loculated collection of pleural air and gas is possible. Recommend CT imaging for further evaluation. Findings called to Dr. Jacqulyn Bath. Electronically Signed   By: Gerome Sam III M.D   On: 03/17/2017 18:20   Ct Chest W Contrast  Result Date: 03/17/2017 CLINICAL DATA:  Shortness of breath. EXAM: CT CHEST WITH CONTRAST TECHNIQUE: Multidetector CT imaging of the chest was performed during intravenous contrast administration. CONTRAST:  75mL ISOVUE-300 IOPAMIDOL (ISOVUE-300) INJECTION 61% COMPARISON:  Radiographs of same day. FINDINGS: Cardiovascular: Atherosclerosis of thoracic aorta is noted without aneurysm or dissection. Normal cardiac size. No pericardial effusion is noted. Coronary artery calcifications are noted. Mediastinum/Nodes: No enlarged mediastinal, hilar, or axillary lymph nodes. Thyroid gland, trachea, and esophagus demonstrate no significant findings. Lungs/Pleura: Large possibly loculated right pleural effusion is noted. Mild subsegmental atelectasis is noted in the right upper lobe. Small left pleural effusion is noted with associated subsegmental atelectasis. No pneumothorax is noted. Upper Abdomen: Severe splenomegaly is  noted. Musculoskeletal: No chest wall abnormality. No acute or significant osseous findings. IMPRESSION: Coronary artery calcifications are noted suggesting coronary artery disease. Large probably loculated right pleural effusion is noted. Severe splenomegaly. Aortic Atherosclerosis (ICD10-I70.0). Electronically Signed   By: Lupita Raider, M.D.   On: 03/17/2017 19:26    EKG: Independently reviewed. Low-voltage QRS, PVC's.   Assessment/Plan  1. Right pleural effusion, ?HCAP  - Pt  presents with SOB and cough, found to have large right pleural effusion, likely loculated  - There was concern for an infectious etiology and pt was started on empiric vancomycin and cefepime   - There is no leukocytosis or fever, and procalcitonin has returned low - IR thoracentesis requested with fluid analysis  - Continue supportive care with supplemental O2, prn nebs   2. CAD - No anginal complaints, troponin wnl  - Continue Plavix, ASA 81   3. Hypothyroidism  - Continue Synthroid  4. Chronic pain  - Attributed to OA and stable  - Continue home regimen with Mobic and prn Oxy IR   - Continue bowel regimen    5. Microcytic anemia  - Hgb is stable at 9.4, MCV now lower at 70.6  - No bleeding evident  - Check anemia panel    6. COPD - No wheezing on admission  - Continue supplemental O2, nebs   DVT prophylaxis: sq Lovenox Code Status: Full  Family Communication: Discussed with patient Disposition Plan: Admit to telemetry Consults called: None Admission status: Inpatient    Briscoe Deutscherimothy S Opyd, MD [facility] Pager [phone]  If 7PM-7AM, please contact night-coverage [email or web address] Password [email or web address]  03/17/2017, 8:31 PM

## 2017-03-17 NOTE — ED Notes (Signed)
Patient transported to CT 

## 2017-03-17 NOTE — ED Triage Notes (Signed)
[facility].  Pt is being treated for pneumonia, denies chest pain.

## 2017-03-18 ENCOUNTER — Encounter (HOSPITAL_COMMUNITY): Payer: Self-pay | Admitting: Student

## 2017-03-18 ENCOUNTER — Inpatient Hospital Stay (HOSPITAL_COMMUNITY): Payer: Medicare HMO

## 2017-03-18 HISTORY — PX: IR THORACENTESIS ASP PLEURAL SPACE W/IMG GUIDE: IMG5380

## 2017-03-18 LAB — BODY FLUID CELL COUNT WITH DIFFERENTIAL
Eos, Fluid: 0 %
Lymphs, Fluid: 30 %
Monocyte-Macrophage-Serous Fluid: 58 % (ref 50–90)
Neutrophil Count, Fluid: 12 % (ref 0–25)
WBC FLUID: 193 uL (ref 0–1000)

## 2017-03-18 LAB — MRSA PCR SCREENING: MRSA BY PCR: POSITIVE — AB

## 2017-03-18 LAB — GRAM STAIN

## 2017-03-18 LAB — FERRITIN: Ferritin: 71 ng/mL (ref 24–336)

## 2017-03-18 LAB — IRON AND TIBC
Iron: 18 ug/dL — ABNORMAL LOW (ref 45–182)
Saturation Ratios: 8 % — ABNORMAL LOW (ref 17.9–39.5)
TIBC: 227 ug/dL — ABNORMAL LOW (ref 250–450)
UIBC: 209 ug/dL

## 2017-03-18 LAB — VITAMIN B12: VITAMIN B 12: 854 pg/mL (ref 180–914)

## 2017-03-18 LAB — PROCALCITONIN

## 2017-03-18 LAB — FOLATE: FOLATE: 9.8 ng/mL (ref >5.9)

## 2017-03-18 MED ORDER — CHLORHEXIDINE GLUCONATE CLOTH 2 % EX PADS
6.0000 | MEDICATED_PAD | Freq: Every day | CUTANEOUS | Status: DC
  Administered 2017-03-18 – 2017-03-22 (×4): 6 via TOPICAL

## 2017-03-18 MED ORDER — MUPIROCIN 2 % EX OINT
1.0000 "application " | TOPICAL_OINTMENT | Freq: Two times a day (BID) | CUTANEOUS | Status: AC
  Administered 2017-03-18 – 2017-03-22 (×10): 1 via NASAL
  Filled 2017-03-18 (×2): qty 22

## 2017-03-18 MED ORDER — LIDOCAINE HCL (PF) 1 % IJ SOLN
INTRAMUSCULAR | Status: DC | PRN
  Administered 2017-03-18: 10 mL

## 2017-03-18 MED ORDER — LIDOCAINE HCL (PF) 1 % IJ SOLN
INTRAMUSCULAR | Status: AC
  Filled 2017-03-18: qty 30

## 2017-03-18 NOTE — Progress Notes (Signed)
PROGRESS NOTE    Caleb Hays  WUJ:[id number] DOB: 05-13-1941 DOA: 03/17/2017 PCP: Oneal Grout, MD    Brief Narrative:   76 y.o. male with medical history significant for COPD, hypothyroidism, peripheral arterial disease, coronary artery disease, depression, and chronic pain, now presenting to the emergency department for evaluation of shortness of breath and cough. Patient reports that he has had a productive cough with dyspnea for the past 2 months, reports improving some initially when he was treated with Augmentin, but has since worsened. He denies fevers or chills, but reports dyspnea with minimal exertion and cough productive of thick white sputum.   Assessment & Plan:   Principal Problem:   HCAP (healthcare-associated pneumonia) - Will plan on continuing antibiotics Cefepime and Vancomycin  Active Problems:    COPD (chronic obstructive pulmonary disease) (HCC) - Stable currently, continue as needed bronchodilators    Hypothyroidism -Stable continue Synthroid    PAD (peripheral artery disease) (HCC) -Continue Plavix and aspirin.    Loculated pleural effusion -  Order placed for thoracentesis. Asian is status post thoracentesis with body fluid analysis    Chronic pain - Stable continue supportive therapy   DVT prophylaxis: Lovenox Code Status: Full Family Communication: No family at bedside Disposition Plan: Pending improvement in condition   Consultants:   none   Procedures: thoracentesis   Antimicrobials: Cefepime and Vancomycin   Subjective: The patient has no new complaints. Breathing condition is about the same per patient  Objective: Vitals:   03/17/17 2200 03/17/17 2240 03/18/17 0605 03/18/17 1446  BP: 135/76 134/66 132/72 139/65  Pulse: 66 (!) 53 (!) 58   Resp: 16  20   Temp:  97.7 F (36.5 C) 98.1 F (36.7 C)   TempSrc:  Oral Oral   SpO2: 97% 94% 98%   Weight:      Height:        Intake/Output Summary (Last 24 hours) at  03/18/17 1621 Last data filed at 03/18/17 1601  Gross per 24 hour  Intake              250 ml  Output             1000 ml  Net             -750 ml   Filed Weights   03/17/17 1711  Weight: 68 kg (150 lb)    Examination:  General exam: Appears calm and comfortable  Respiratory system: decreased breath sounds over right lung field, equal chest rise, no wheezes Cardiovascular system: S1 & S2 heard, RRR. No JVD, murmurs, rubs or gallops  Gastrointestinal system: Abdomen is nondistended, soft and nontender. No organomegaly or masses felt. Normal bowel sounds heard. Central nervous system: Alert and oriented. No focal neurological deficits. Extremities: Symmetric 5 x 5 power. Skin: No rashes, lesions or ulcers on limited exam ,  Psychiatry: Mood & affect appropriate.   Data Reviewed: I have personally reviewed following labs and imaging studies  CBC:  Recent Labs Lab 03/17/17 1725  WBC 10.2  NEUTROABS 7.1  HGB 9.4*  HCT 31.2*  MCV 70.6*  PLT 385   Basic Metabolic Panel:  Recent Labs Lab 03/17/17 1725  NA 140  K 3.7  CL 105  CO2 27  GLUCOSE 114*  BUN 15  CREATININE 1.14  CALCIUM 8.4*   GFR: Estimated Creatinine Clearance: 53.8 mL/min (by C-G formula based on SCr of 1.14 mg/dL). Liver Function Tests:  Recent Labs Lab 03/17/17 1725  AST 17  ALT 8*  ALKPHOS 56  BILITOT 0.5  PROT 5.4*  ALBUMIN 3.0*   No results for input(s): LIPASE, AMYLASE in the last 168 hours. No results for input(s): AMMONIA in the last 168 hours. Coagulation Profile: No results for input(s): INR, PROTIME in the last 168 hours. Cardiac Enzymes: No results for input(s): CKTOTAL, CKMB, CKMBINDEX, TROPONINI in the last 168 hours. BNP (last 3 results) No results for input(s): PROBNP in the last 8760 hours. HbA1C: No results for input(s): HGBA1C in the last 72 hours. CBG: No results for input(s): GLUCAP in the last 168 hours. Lipid Profile: No results for input(s): CHOL, HDL, LDLCALC,  TRIG, CHOLHDL, LDLDIRECT in the last 72 hours. Thyroid Function Tests: No results for input(s): TSH, T4TOTAL, FREET4, T3FREE, THYROIDAB in the last 72 hours. Anemia Panel:  Recent Labs  03/17/17 2303 03/17/17 2304  VITAMINB12 854  --   FOLATE  --  9.8  FERRITIN 71  --   TIBC 227*  --   IRON 18*  --   RETICCTPCT 1.9  --    Sepsis Labs:  Recent Labs Lab 03/17/17 2303  PROCALCITON <0.10    Recent Results (from the past 240 hour(s))  Culture, blood (routine x 2) Call MD if unable to obtain prior to antibiotics being given     Status: None (Preliminary result)   Collection Time: 03/17/17 10:56 PM  Result Value Ref Range Status   Specimen Description BLOOD RIGHT ANTECUBITAL  Final   Special Requests   Final    BOTTLES DRAWN AEROBIC AND ANAEROBIC Blood Culture adequate volume   Culture NO GROWTH < 24 HOURS  Final   Report Status PENDING  Incomplete  Culture, blood (routine x 2) Call MD if unable to obtain prior to antibiotics being given     Status: None (Preliminary result)   Collection Time: 03/17/17 11:06 PM  Result Value Ref Range Status   Specimen Description BLOOD LEFT ANTECUBITAL  Final   Special Requests   Final    BOTTLES DRAWN AEROBIC ONLY Blood Culture adequate volume   Culture NO GROWTH < 24 HOURS  Final   Report Status PENDING  Incomplete  MRSA PCR Screening     Status: Abnormal   Collection Time: 03/18/17 12:07 AM  Result Value Ref Range Status   MRSA by PCR POSITIVE (A) NEGATIVE Final    Comment:        The GeneXpert MRSA Assay (FDA approved for NASAL specimens only), is one component of a comprehensive MRSA colonization surveillance program. It is not intended to diagnose MRSA infection nor to guide or monitor treatment for MRSA infections. RESULT CALLED TO, READ BACK BY AND VERIFIED WITH: K.COBLE, RN 03/18/17 0200 L.CHAMPION          Radiology Studies: Dg Chest 1 View  Result Date: 03/18/2017 CLINICAL DATA:  Follow-up examination status post  right-sided thoracentesis. EXAM: CHEST 1 VIEW COMPARISON:  Prior CT from 03/17/2017. FINDINGS: Stable cardiomegaly. Mediastinal silhouette within normal limits. Aortic atherosclerosis noted. Lungs normally inflated. Small residual right-sided pleural effusion, improved from previous. No pneumothorax. No new focal infiltrates. Diffuse interstitial prominence without frank pulmonary edema. Osseous structures unchanged. IMPRESSION: 1. Small residual right pleural effusion status post thoracentesis, improved from previous. No pneumothorax. 2. Stable cardiomegaly with aortic atherosclerosis. Electronically Signed   By: Rise MuBenjamin  McClintock M.D.   On: 03/18/2017 15:45   Dg Chest 2 View  Result Date: 03/17/2017 CLINICAL DATA:  Recent pneumonia. EXAM: CHEST  2 VIEW COMPARISON:  June 23, 2015 FINDINGS: No pneumothorax. There is a rounded opacity in the lateral right lung base. There is an air-fluid level seen on frontal and lateral views. Cardiomegaly identified. The hila and mediastinum are unremarkable. No other suspicious findings. IMPRESSION: 1. There is a rounded masslike opacity in the lateral right lung base with an air-fluid level along its superior margin. There is clearly a component of fluid. This could be sequela from recent infection. However a necrotic mass or loculated collection of pleural air and gas is possible. Recommend CT imaging for further evaluation. Findings called to Dr. Jacqulyn Bath. Electronically Signed   By: Gerome Sam III M.D   On: 03/17/2017 18:20   Ct Chest W Contrast  Result Date: 03/17/2017 CLINICAL DATA:  Shortness of breath. EXAM: CT CHEST WITH CONTRAST TECHNIQUE: Multidetector CT imaging of the chest was performed during intravenous contrast administration. CONTRAST:  75mL ISOVUE-300 IOPAMIDOL (ISOVUE-300) INJECTION 61% COMPARISON:  Radiographs of same day. FINDINGS: Cardiovascular: Atherosclerosis of thoracic aorta is noted without aneurysm or dissection. Normal cardiac size. No  pericardial effusion is noted. Coronary artery calcifications are noted. Mediastinum/Nodes: No enlarged mediastinal, hilar, or axillary lymph nodes. Thyroid gland, trachea, and esophagus demonstrate no significant findings. Lungs/Pleura: Large possibly loculated right pleural effusion is noted. Mild subsegmental atelectasis is noted in the right upper lobe. Small left pleural effusion is noted with associated subsegmental atelectasis. No pneumothorax is noted. Upper Abdomen: Severe splenomegaly is noted. Musculoskeletal: No chest wall abnormality. No acute or significant osseous findings. IMPRESSION: Coronary artery calcifications are noted suggesting coronary artery disease. Large probably loculated right pleural effusion is noted. Severe splenomegaly. Aortic Atherosclerosis (ICD10-I70.0). Electronically Signed   By: Lupita Raider, M.D.   On: 03/17/2017 19:26   Ir Thoracentesis Asp Pleural Space W/img Guide  Result Date: 03/18/2017 INDICATION: Patient presents for shortness of breath and cough, found to have a large right pleural effusion. Request is made for diagnostic and therapeutic thoracentesis. EXAM: ULTRASOUND GUIDED DIAGNOSTIC AND THERAPEUTIC RIGHT THORACENTESIS MEDICATIONS: 10 mL 1% lidocaine COMPLICATIONS: None immediate. PROCEDURE: An ultrasound guided thoracentesis was thoroughly discussed with the patient and questions answered. The benefits, risks, alternatives and complications were also discussed. The patient understands and wishes to proceed with the procedure. Written consent was obtained. Ultrasound was performed to localize and mark an adequate pocket of fluid in the right chest. The area was then prepped and draped in the normal sterile fashion. 1% Lidocaine was used for local anesthesia. Under ultrasound guidance a Safe-T-Centesis catheter was introduced. Thoracentesis was performed. The catheter was removed and a dressing applied. FINDINGS: A total of approximately 2.0 liters of yellow  fluid was removed. Samples were sent to the laboratory as requested by the clinical team. IMPRESSION: Successful ultrasound guided diagnostic and therapeutic right thoracentesis yielding 2.0 liters of pleural fluid. No pneumothorax on follow-up chest radiograph. Read by:  Loyce Dys PA-C Electronically Signed   By: Corlis Leak M.D.   On: 03/18/2017 15:26    Scheduled Meds: . allopurinol  100 mg Oral Daily  . aspirin  81 mg Oral Daily  . Chlorhexidine Gluconate Cloth  6 each Topical Q0600  . cholecalciferol  1,000 Units Oral Daily  . clopidogrel  75 mg Oral Daily  . DULoxetine  20 mg Oral Daily  . enoxaparin (LOVENOX) injection  40 mg Subcutaneous Q24H  . famotidine  20 mg Oral Daily  . gabapentin  200 mg Oral QHS  . levothyroxine  12.5 mcg Oral QAC breakfast  . lidocaine (PF)      .  meloxicam  7.5 mg Oral Daily  . methocarbamol  500 mg Oral BID  . multivitamin with minerals  1 tablet Oral Daily  . mupirocin ointment  1 application Nasal BID  . pantoprazole  40 mg Oral Daily  . polyethylene glycol  17 g Oral Daily  . saccharomyces boulardii  250 mg Oral BID  . sodium chloride flush  3 mL Intravenous Q12H  . traZODone  50 mg Oral QHS   Continuous Infusions: . sodium chloride    . ceFEPime (MAXIPIME) IV Stopped (03/18/17 0901)  . vancomycin 500 mg (03/18/17 1333)     LOS: 1 day    Time spent: > 35 minutes  Penny Pia, MD [facility] Pager [phone]  If 7PM-7AM, please contact night-coverage [email or web address] Password [email or web address] 03/18/2017, 4:21 PM

## 2017-03-18 NOTE — Progress Notes (Signed)
Pharmacy Antibiotic Note Caleb MarinerJerald J Hays is a 76 y.o. male admitted on 03/17/2017 with concern for pneumonia and pleural effusion.   Plan: 1. Continue Cefepime 2 grams q12h 2. Continue vancomycin 500 mg IV every 12 hours  3. If vancom[facility] continued long enough [facility]; goal trough 15-20  Height: 6\' 4"  (193 cm) Weight: 150 lb (68 kg) IBW/kg (Calculated) : 86.8  Temp (24hrs), Avg:98.1 F (36.7 C), Min:97.7 F (36.5 C), Max:98.4 F (36.9 C)   Recent Labs Lab 03/17/17 1725  WBC 10.2  CREATININE 1.14    Estimated Creatinine Clearance: 53.8 mL/min (by C-G formula based on SCr of 1.14 mg/dL).    No Known Allergies  Antibiotics:  Cefepime 8/30>> Vancomycin 8/30>>  Pertinent culture data:  8/31 MRSA PCR: positive  8/30 Blood x 2: px 8/30 Sputum: px  Pollyann SamplesAndy Makelle Hays, PharmD, BCPS 03/18/2017, 10:48 AM

## 2017-03-18 NOTE — Progress Notes (Addendum)
NURSING PROGRESS NOTE  OZRO RUSSETT MRN: [id number] Admission Data: 03/17/17 at 2235 Attending Provider: Briscoe Deutscher, MD PCP: Oneal Grout, MD Code status: Full  Allergies: No Known Allergies  Past Medical History:  Past Medical History:  Diagnosis Date  . Adult failure to thrive    /notes 06/23/2015  . Anemia   . Arthritis    "hands, fingers" (11/10/2016  . COPD (chronic obstructive pulmonary disease) (HCC)    LONG TIME SMOKER  . Critical lower limb ischemia   . 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 Depression    "periods of depression" (06/23/2015)  . Gout   . Hypothyroidism   . PAD (peripheral artery disease) (HCC)   . Physical deconditioning   . Pyelonephritis 06/23/2015  . Status post foot surgery    right fifth toe amputation by Dr. Lajoyce Corners    Past Surgical History:  Past Surgical History:  Procedure Laterality Date  . ABDOMINAL AORTOGRAM W/LOWER EXTREMITY N/A 09/03/2016   Procedure: Abdominal Aortogram w/Lower Extremity;  Surgeon: Sherren Kerns, MD;  Location: [facility] INVASIVE CV LAB;  Service: Cardiovascular;  Laterality: N/A;  Lt. leg  . ABDOMINAL AORTOGRAM W/LOWER EXTREMITY N/A 11/10/2016   Procedure: Abdominal Aortogram w/Lower Extremity;  Surgeon: Maeola Harman, MD;  Location: [facility] INVASIVE CV LAB;  Service: Cardiovascular;  Laterality: N/A;  . AMPUTATION Right 03/01/2014   Procedure: AMPUTATION RAY;  Surgeon: Nadara Mustard, MD;  Location: [facility] OR;  Service: Orthopedics;  Laterality: Right;  Right Foot 5th Ray Amputation  . AMPUTATION Right 04/26/2014   Procedure: Right Foot 4th Ray Amputation;  Surgeon: Nadara Mustard, MD;  Location: [facility] OR;  Service: Orthopedics;  Laterality: Right;  . AMPUTATION Right 10/01/2015   Procedure: Right Below Knee Amputation;  Surgeon: Nadara Mustard, MD;  Location: [facility] OR;  Service: Orthopedics;  Laterality: Right;  . AMPUTATION Left 10/08/2016   Procedure: Left Transmetatarsal Amputation;   Surgeon: Nadara Mustard, MD;  Location: [facility] OR;  Service: Orthopedics;  Laterality: Left;  . AMPUTATION Left 07/23/2016   Procedure: Left 2nd Toe Amputation;  Surgeon: Nadara Mustard, MD;  Location: [facility] OR;  Service: Orthopedics;  Laterality: Left;  . ANKLE ARTHROSCOPY Right 08/13/2015   Procedure: ANKLE ARTHROSCOPY;  Surgeon: Nadara Mustard, MD;  Location: [facility] OR;  Service: Orthopedics;  Laterality: Right;  . CATARACT EXTRACTION W/ INTRAOCULAR LENS  IMPLANT, BILATERAL Bilateral   . INGUINAL HERNIA REPAIR Left   . KNEE CARTILAGE SURGERY Left 1960's   football injury  . KNEE LIGAMENT RECONSTRUCTION Left 1960's  . LOWER EXTREMITY ANGIOGRAM N/A 05/27/2014   Procedure: LOWER EXTREMITY ANGIOGRAM;  Surgeon: Runell Gess, MD;  Location: [facility] CATH LAB;  Service: Cardiovascular;  Laterality: N/A;  . LOWER EXTREMITY ANGIOGRAPHY Left 09/15/2016   Procedure: Lower Extremity Angiography;  Surgeon: Maeola Harman, MD;  Location: [facility] INVASIVE CV LAB;  Service: Cardiovascular;  Laterality: Left;  . PERIPHERAL VASCULAR ATHERECTOMY  11/10/2016   Procedure: Peripheral Vascular Atherectomy;  Surgeon: Maeola Harman, MD;  Location: [facility] INVASIVE CV LAB;  Service: Cardiovascular;;  . PERIPHERAL VASCULAR INTERVENTION Left 09/15/2016   Procedure: Peripheral Vascular Intervention;  Surgeon: Maeola Harman, MD;  Location: [facility] INVASIVE CV LAB;  Service: Cardiovascular;  Laterality: Left;  SFA/popiteal  . PERIPHERAL VASCULAR INTERVENTION  11/10/2016   Hattie Perch 11/10/2016  . PERIPHERAL VASCULAR INTERVENTION  11/10/2016   Procedure: Peripheral Vascular Intervention;  Surgeon: Maeola Harman, MD;  Location: [facility]  INVASIVE CV LAB;  Service: Cardiovascular;;  . REFRACTIVE SURGERY Bilateral   . STUMP REVISION Right 12/05/2015   Procedure: Revision Right Below Knee Amputation;  Surgeon: Nadara MustardMarcus Duda V, MD;  [facility];  Service: Orthopedics;  Laterality: Right;  . STUMP REVISION Left 11/19/2016    Procedure: Revision Left Transmetatarsal Amputation;  Surgeon: Nadara Mustarduda, Marcus V, MD;  [facility];  Service: Orthopedics;  Laterality: Left;  Caleb MarinerJerald J Hays is a 76 y.o. male patient, arrived to floor in room [id number] via stretcher, transferred from ED. Patient alert and oriented X 4. No acute distress noted. Denies pain.   Vital signs: Oral temperature 97.7 F (36.5 C), Blood pressure 134/66, Pulse 53, RR 18, SpO2 94 % on 3 liters via [place].   Cardiac monitoring: Telemetry box 5W # 22 in place. Second verified by Cristobal GoldmannIvana M., RN  IV access: Left forearm-saline locked condition patent and no redness.  Skin: intact, no pressure ulcer noted in sacral area. Ecchymotic abrasion noted on right lateral thigh. Has a healed right BKA and all toes on left foot have been amputated. Sacrum is red, but blanchable.   Patient's ID armband verified with patient and in place. Information packet given to patient. Fall risk assessed, SR up X2, patient able to verbalize understanding of risks associated with falls and to call nurse or staff to assist before getting out of bed. Patient oriented to room and equipment. Call bell within reach.

## 2017-03-18 NOTE — Progress Notes (Signed)
Initial Nutrition Assessment  DOCUMENTATION CODES:   Non-severe (moderate) malnutrition in context of chronic illness  INTERVENTION:   -RD will follow for diet advancement and supplement as appropriate  NUTRITION DIAGNOSIS:   Malnutrition (Mild) related to chronic illness (COPD) as evidenced by severe depletion of body fat, moderate depletions of muscle mass, mild depletion of muscle mass.  GOAL:   Patient will meet greater than or equal to 90% of their needs  MONITOR:   PO intake, Diet advancement, Labs, Weight trends, Skin, I & O's  REASON FOR ASSESSMENT:   Malnutrition Screening Tool    ASSESSMENT:   Caleb Hays is a 76 y.o. male with medical history significant for COPD, hypothyroidism, peripheral arterial disease, coronary artery disease, depression, and chronic pain, now presenting to the emergency department for evaluation of shortness of breath and cough. Patient reports that he has had a productive cough with dyspnea for the past 2 months, reports improving some initially when he was treated with Augmentin, but has since worsened.  Pt admitted with rt pleural effusion, with questionable HCAP.   [facility] No available records in shadow chart.  Pt very sleepy at time of visit, refused participation in interview, but amenable to exam. Nutrition-Focused physical exam completed. Findings are severe fat depletion, mild to moderate muscle depletion, and no edema.   Reviewed wt hx, which is variable at baseline (noted wt ranges between 145-175#). No weight changes significant for time frame.  Nutrition-Focused physical exam completed. Findings are severe fat depletion, mild to modrate muscle depletion, and no edema.   Labs reviewed.   Diet Order:  Diet NPO time specified Except for: Ice Chips, Sips with Meds  Skin:  Reviewed, no issues  Last BM:  PTA  Height:   Ht Readings from Last 1 Encounters:  03/17/17 6\' 4"  (1.93 m)    Weight:   Wt  Readings from Last 1 Encounters:  03/17/17 150 lb (68 kg)    Ideal Body Weight:  85.7 kg (adjusted for lt BKA)  BMI:  Body mass index is 18.26 kg/m.  Estimated Nutritional Needs:   Kcal:  1800-2000  Protein:  95-110 grams  Fluid:  1.8-2.0 L  EDUCATION NEEDS:   Education needs no appropriate at this time  Zakiyyah Savannah A. Mayford KnifeWilliams, RD, LDN, CDE Pager: [phone] After hours Pager: [phone]

## 2017-03-18 NOTE — Progress Notes (Signed)
[facility] from [facility]. Patient's MRSA PCR came back positive. Standing orders for MRSA treatment placed.

## 2017-03-18 NOTE — Procedures (Signed)
PROCEDURE SUMMARY:  Successful US guided diagnostic and therapeutic right thoracentesis. Yielded 2.0 liters of yellow fluid. Pt tolerated procedure well. No immediate complications.  Specimen was sent for labs. CXR ordered.  Hoyt KochKacie Sue-Ellen Ammie Warrick PA-C 03/18/2017 3:34 PM

## 2017-03-19 ENCOUNTER — Inpatient Hospital Stay (HOSPITAL_COMMUNITY): Payer: Medicare HMO

## 2017-03-19 LAB — BASIC METABOLIC PANEL
Anion gap: 9 (ref 5–15)
BUN: 18 mg/dL (ref 6–20)
CHLORIDE: 103 mmol/L (ref 101–111)
CO2: 26 mmol/L (ref 22–32)
Calcium: 8.1 mg/dL — ABNORMAL LOW (ref 8.9–10.3)
Creatinine, Ser: 0.97 mg/dL (ref 0.61–1.24)
GFR calc Af Amer: >60 mL/min (ref >60)
GFR calc non Af Amer: >60 mL/min (ref >60)
GLUCOSE: 69 mg/dL (ref 65–99)
POTASSIUM: 3.9 mmol/L (ref 3.5–5.1)
SODIUM: 138 mmol/L (ref 135–145)

## 2017-03-19 LAB — PROCALCITONIN: Procalcitonin: <0.1 ng/mL

## 2017-03-19 NOTE — Progress Notes (Signed)
PROGRESS NOTE    Caleb Hays  ZOX:[id number] DOB: [birth year] DOA: 03/17/2017 PCP: Oneal Grout, MD    Brief Narrative:   76 y.o. male with medical history significant for COPD, hypothyroidism, peripheral arterial disease, coronary artery disease, depression, and chronic pain, now presenting to the emergency department for evaluation of shortness of breath and cough. Patient reports that he has had a productive cough with dyspnea for the past 2 months, reports improving some initially when he was treated with Augmentin, but has since worsened. He denies fevers or chills, but reports dyspnea with minimal exertion and cough productive of thick white sputum.   Assessment & Plan:   Principal Problem:   HCAP (healthcare-associated pneumonia) - improving on  Cefepime and Vancomycin will continue - awaiting pleural fluid evaluation  Active Problems:    COPD (chronic obstructive pulmonary disease) (HCC) - Stable currently, continue as needed bronchodilators    Hypothyroidism -Stable continue Synthroid    PAD (peripheral artery disease) (HCC) -Continue Plavix and aspirin.    Loculated pleural effusion -  Order placed for thoracentesis. Asian is status post thoracentesis with body fluid analysis    Chronic pain - Stable continue supportive therapy   DVT prophylaxis: Lovenox Code Status: Full Family Communication: No family at bedside Disposition Plan: Pending improvement in condition   Consultants:   none   Procedures: thoracentesis   Antimicrobials: Cefepime and Vancomycin   Subjective: The states his breathing condition is better.  Objective: Vitals:   03/18/17 1750 03/18/17 1820 03/18/17 2215 03/19/17 0501  BP:  (!) 104/54 136/68 136/71  Pulse:  69 (!) 114 61  Resp:  16 16 16   Temp:   97.8 F (36.6 C) 97.9 F (36.6 C)  TempSrc:   Oral Oral  SpO2: 97% 97% 99% 99%  Weight:      Height:        Intake/Output Summary (Last 24 hours) at 03/19/17  1745 Last data filed at 03/19/17 1459  Gross per 24 hour  Intake              120 ml  Output              650 ml  Net             -530 ml   Filed Weights   03/17/17 1711  Weight: 68 kg (150 lb)    Examination:  General exam: Appears calm and comfortable  Respiratory system: improved aeration at right lung field, equal chest rise, no wheezes Cardiovascular system: S1 & S2 heard, RRR. No JVD, murmurs, rubs or gallops  Gastrointestinal system: Abdomen is nondistended, soft and nontender. No organomegaly or masses felt. Normal bowel sounds heard. Central nervous system: Alert and oriented. No focal neurological deficits. Extremities: Symmetric 5 x 5 power. Skin: No rashes, lesions or ulcers on limited exam ,  Psychiatry: Mood & affect appropriate.   Data Reviewed: I have personally reviewed following labs and imaging studies  CBC:  Recent Labs Lab 03/17/17 1725  WBC 10.2  NEUTROABS 7.1  HGB 9.4*  HCT 31.2*  MCV 70.6*  PLT 385   Basic Metabolic Panel:  Recent Labs Lab 03/17/17 1725 03/19/17 0532  NA 140 138  K 3.7 3.9  CL 105 103  CO2 27 26  GLUCOSE 114* 69  BUN 15 18  CREATININE 1.14 0.97  CALCIUM 8.4* 8.1*   GFR: Estimated Creatinine Clearance: 63.3 mL/min (by C-G formula based on SCr of 0.97 mg/dL). Liver Function Tests:  Recent  Labs Lab 03/17/17 1725  AST 17  ALT 8*  ALKPHOS 56  BILITOT 0.5  PROT 5.4*  ALBUMIN 3.0*   No results for input(s): LIPASE, AMYLASE in the last 168 hours. No results for input(s): AMMONIA in the last 168 hours. Coagulation Profile: No results for input(s): INR, PROTIME in the last 168 hours. Cardiac Enzymes: No results for input(s): CKTOTAL, CKMB, CKMBINDEX, TROPONINI in the last 168 hours. BNP (last 3 results) No results for input(s): PROBNP in the last 8760 hours. HbA1C: No results for input(s): HGBA1C in the last 72 hours. CBG: No results for input(s): GLUCAP in the last 168 hours. Lipid Profile: No results for  input(s): CHOL, HDL, LDLCALC, TRIG, CHOLHDL, LDLDIRECT in the last 72 hours. Thyroid Function Tests: No results for input(s): TSH, T4TOTAL, FREET4, T3FREE, THYROIDAB in the last 72 hours. Anemia Panel:  Recent Labs  03/17/17 2303 03/17/17 2304  VITAMINB12 854  --   FOLATE  --  9.8  FERRITIN 71  --   TIBC 227*  --   IRON 18*  --   RETICCTPCT 1.9  --    Sepsis Labs:  Recent Labs Lab 03/17/17 2303 03/19/17 0532  PROCALCITON <0.10 <0.10    Recent Results (from the past 240 hour(s))  Culture, blood (routine x 2) Call MD if unable to obtain prior to antibiotics being given     Status: None (Preliminary result)   Collection Time: 03/17/17 10:56 PM  Result Value Ref Range Status   Specimen Description BLOOD RIGHT ANTECUBITAL  Final   Special Requests   Final    BOTTLES DRAWN AEROBIC AND ANAEROBIC Blood Culture adequate volume   Culture NO GROWTH 2 DAYS  Final   Report Status PENDING  Incomplete  Culture, blood (routine x 2) Call MD if unable to obtain prior to antibiotics being given     Status: None (Preliminary result)   Collection Time: 03/17/17 11:06 PM  Result Value Ref Range Status   Specimen Description BLOOD LEFT ANTECUBITAL  Final   Special Requests   Final    BOTTLES DRAWN AEROBIC ONLY Blood Culture adequate volume   Culture NO GROWTH 2 DAYS  Final   Report Status PENDING  Incomplete  MRSA PCR Screening     Status: Abnormal   Collection Time: 03/18/17 12:07 AM  Result Value Ref Range Status   MRSA by PCR POSITIVE (A) NEGATIVE Final    Comment:        The GeneXpert MRSA Assay (FDA approved for NASAL specimens only), is one component of a comprehensive MRSA colonization surveillance program. It is not intended to diagnose MRSA infection nor to guide or monitor treatment for MRSA infections. RESULT CALLED TO, READ BACK BY AND VERIFIED WITH: K.COBLE, RN 03/18/17 0200 L.CHAMPION   Gram stain     Status: None   Collection Time: 03/18/17  3:37 PM  Result Value  Ref Range Status   Specimen Description PLEURAL RIGHT  Final   Special Requests NONE  Final   Gram Stain   Final    ABUNDANT WBC PRESENT,BOTH PMN AND MONONUCLEAR NO ORGANISMS SEEN    Report Status 03/18/2017 FINAL  Final  Culture, body fluid-bottle     Status: None (Preliminary result)   Collection Time: 03/18/17  3:37 PM  Result Value Ref Range Status   Specimen Description PLEURAL RIGHT  Final   Special Requests NONE  Final   Culture NO GROWTH < 24 HOURS  Final   Report Status PENDING  Incomplete  Radiology Studies: Dg Chest 1 View  Result Date: 03/18/2017 CLINICAL DATA:  Follow-up examination status post right-sided thoracentesis. EXAM: CHEST 1 VIEW COMPARISON:  Prior CT from 03/17/2017. FINDINGS: Stable cardiomegaly. Mediastinal silhouette within normal limits. Aortic atherosclerosis noted. Lungs normally inflated. Small residual right-sided pleural effusion, improved from previous. No pneumothorax. No new focal infiltrates. Diffuse interstitial prominence without frank pulmonary edema. Osseous structures unchanged. IMPRESSION: 1. Small residual right pleural effusion status post thoracentesis, improved from previous. No pneumothorax. 2. Stable cardiomegaly with aortic atherosclerosis. Electronically Signed   By: Rise Mu M.D.   On: 03/18/2017 15:45   Dg Chest 2 View  Result Date: 03/19/2017 CLINICAL DATA:  Shortness of breath, COPD, peripheral arterial disease EXAM: CHEST  2 VIEW COMPARISON:  None available FINDINGS: Stable mild cardiomegaly with central vascular congestion. Stable mild interstitial edema pattern with minor basilar atelectasis and small pleural effusions. No pneumothorax. Trachea is midline. Aorta is atherosclerotic. IMPRESSION: Cardiomegaly with mild interstitial edema pattern and small pleural effusions. Little interval change. Thoracic aortic atherosclerosis Electronically Signed   By: Judie Petit.  Shick M.D.   On: 03/19/2017 11:07   Dg Chest 2  View  Result Date: 03/17/2017 CLINICAL DATA:  Recent pneumonia. EXAM: CHEST  2 VIEW COMPARISON:  June 23, 2015 FINDINGS: No pneumothorax. There is a rounded opacity in the lateral right lung base. There is an air-fluid level seen on frontal and lateral views. Cardiomegaly identified. The hila and mediastinum are unremarkable. No other suspicious findings. IMPRESSION: 1. There is a rounded masslike opacity in the lateral right lung base with an air-fluid level along its superior margin. There is clearly a component of fluid. This could be sequela from recent infection. However a necrotic mass or loculated collection of pleural air and gas is possible. Recommend CT imaging for further evaluation. Findings called to Dr. Jacqulyn Bath. Electronically Signed   By: Gerome Sam III M.D   On: 03/17/2017 18:20   Ct Chest W Contrast  Result Date: 03/17/2017 CLINICAL DATA:  Shortness of breath. EXAM: CT CHEST WITH CONTRAST TECHNIQUE: Multidetector CT imaging of the chest was performed during intravenous contrast administration. CONTRAST:  75mL ISOVUE-300 IOPAMIDOL (ISOVUE-300) INJECTION 61% COMPARISON:  Radiographs of same day. FINDINGS: Cardiovascular: Atherosclerosis of thoracic aorta is noted without aneurysm or dissection. Normal cardiac size. No pericardial effusion is noted. Coronary artery calcifications are noted. Mediastinum/Nodes: No enlarged mediastinal, hilar, or axillary lymph nodes. Thyroid gland, trachea, and esophagus demonstrate no significant findings. Lungs/Pleura: Large possibly loculated right pleural effusion is noted. Mild subsegmental atelectasis is noted in the right upper lobe. Small left pleural effusion is noted with associated subsegmental atelectasis. No pneumothorax is noted. Upper Abdomen: Severe splenomegaly is noted. Musculoskeletal: No chest wall abnormality. No acute or significant osseous findings. IMPRESSION: Coronary artery calcifications are noted suggesting coronary artery disease.  Large probably loculated right pleural effusion is noted. Severe splenomegaly. Aortic Atherosclerosis (ICD10-I70.0). Electronically Signed   By: Lupita Raider, M.D.   On: 03/17/2017 19:26   Ir Thoracentesis Asp Pleural Space W/img Guide  Result Date: 03/18/2017 INDICATION: Patient presents for shortness of breath and cough, found to have a large right pleural effusion. Request is made for diagnostic and therapeutic thoracentesis. EXAM: ULTRASOUND GUIDED DIAGNOSTIC AND THERAPEUTIC RIGHT THORACENTESIS MEDICATIONS: 10 mL 1% lidocaine COMPLICATIONS: None immediate. PROCEDURE: An ultrasound guided thoracentesis was thoroughly discussed with the patient and questions answered. The benefits, risks, alternatives and complications were also discussed. The patient understands and wishes to proceed with the procedure. Written consent was  obtained. Ultrasound was performed to localize and mark an adequate pocket of fluid in the right chest. The area was then prepped and draped in the normal sterile fashion. 1% Lidocaine was used for local anesthesia. Under ultrasound guidance a Safe-T-Centesis catheter was introduced. Thoracentesis was performed. The catheter was removed and a dressing applied. FINDINGS: A total of approximately 2.0 liters of yellow fluid was removed. Samples were sent to the laboratory as requested by the clinical team. IMPRESSION: Successful ultrasound guided diagnostic and therapeutic right thoracentesis yielding 2.0 liters of pleural fluid. No pneumothorax on follow-up chest radiograph. Read by:  Loyce DysKacie Matthews PA-C Electronically Signed   By: Corlis Leak  Hassell M.D.   On: 03/18/2017 15:26    Scheduled Meds: . allopurinol  100 mg Oral Daily  . aspirin  81 mg Oral Daily  . Chlorhexidine Gluconate Cloth  6 each Topical Q0600  . cholecalciferol  1,000 Units Oral Daily  . clopidogrel  75 mg Oral Daily  . DULoxetine  20 mg Oral Daily  . enoxaparin (LOVENOX) injection  40 mg Subcutaneous Q24H  .  famotidine  20 mg Oral Daily  . gabapentin  200 mg Oral QHS  . levothyroxine  12.5 mcg Oral QAC breakfast  . meloxicam  7.5 mg Oral Daily  . methocarbamol  500 mg Oral BID  . multivitamin with minerals  1 tablet Oral Daily  . mupirocin ointment  1 application Nasal BID  . pantoprazole  40 mg Oral Daily  . polyethylene glycol  17 g Oral Daily  . saccharomyces boulardii  250 mg Oral BID  . sodium chloride flush  3 mL Intravenous Q12H  . traZODone  50 mg Oral QHS   Continuous Infusions: . sodium chloride    . ceFEPime (MAXIPIME) IV Stopped (03/19/17 [id number])  . vancomycin Stopped (03/19/17 1032)     LOS: 2 days    Time spent: > 35 minutes  Penny PiaVEGA, Candie Gintz, MD [facility] Pager [phone]  If 7PM-7AM, please contact night-coverage [email or web address] Password [email or web address] 03/19/2017, 5:45 PM

## 2017-03-20 LAB — VANCOMYCIN, TROUGH: Vancomycin Tr: 15 ug/mL (ref 15–20)

## 2017-03-20 NOTE — Progress Notes (Signed)
Pharmacy Antibiotic Note  Caleb MarinerJerald J Hays is a 76 y.o. male admitted on 03/17/2017 with pneumonia. Pharmacy dosing cefepime and vancomycin. Noted plans for de-escalation in am  -SCr= 0.91, PCT < 0.1 -vancomycin trough= 15  8/31 MRSA PCR; positive 8/30 Blood x 2 : ngtd 8/31 Body fluid : ngtd   Plan:  Continue Cefepime 2g q12h Continue vancomycin 500mg  IV q12h Consider po antibiotics in am?  Height: 6\' 4"  (193 cm) Weight: 170 lb 12.8 oz (77.5 kg) IBW/kg (Calculated) : 86.8  Temp (24hrs), Avg:97.9 F (36.6 C), Min:97.8 F (36.6 C), Max:98.2 F (36.8 C)   Recent Labs Lab 03/17/17 1725 03/19/17 0532 03/20/17 1923  WBC 10.2  --   --   CREATININE 1.14 0.97  --   VANCOTROUGH  --   --  15    Estimated Creatinine Clearance: 72.1 mL/min (by C-G formula based on SCr of 0.97 mg/dL).    No Known Allergies  Caleb Germanndrew Kailin Hays, Pharm D 03/20/2017 8:24 PM

## 2017-03-20 NOTE — Progress Notes (Signed)
PROGRESS NOTE    Caleb Hays  WUJ:[id number] DOB: 11-21-1940 DOA: 03/17/2017 PCP: Oneal Grout, MD    Brief Narrative:   76 y.o. male with medical history significant for COPD, hypothyroidism, peripheral arterial disease, coronary artery disease, depression, and chronic pain, now presenting to the emergency department for evaluation of shortness of breath and cough. Patient reports that he has had a productive cough with dyspnea for the past 2 months, reports improving some initially when he was treated with Augmentin, but has since worsened. He denies fevers or chills, but reports dyspnea with minimal exertion and cough productive of thick white sputum.    Assessment & Plan:   Principal Problem:   HCAP (healthcare-associated pneumonia) - improving on  Cefepime and Vancomycin will continue - Pleural fluid evaluated and reporting nor organisms seen on Gram stain - We'll de-escalate antibiotic regimen most likely next a.m.  Active Problems:    COPD (chronic obstructive pulmonary disease) (HCC) - Stable currently, continue as needed bronchodilators    Hypothyroidism -Stable continue Synthroid    PAD (peripheral artery disease) (HCC) -Continue Plavix and aspirin.    Loculated pleural effusion - Order placed for thoracentesis.  - Please see above    Chronic pain - Stable continue supportive therapy   DVT prophylaxis: Lovenox Code Status: Full Family Communication: No family at bedside Disposition Plan: Pending improvement in condition   Consultants:   none   Procedures: thoracentesis   Antimicrobials: Cefepime and Vancomycin   Subjective: Patient has no new complaints. No acute issues overnight  Objective: Vitals:   03/19/17 2200 03/20/17 0446 03/20/17 0500 03/20/17 1534  BP: 118/64 116/67  (!) 123/52  Pulse: 60 63  (!) 56  Resp: 17 17  16   Temp: 97.8 F (36.6 C) 98.2 F (36.8 C)  97.8 F (36.6 C)  TempSrc: Oral Oral  Oral  SpO2: 100% 99%  100%    Weight:   77.5 kg (170 lb 12.8 oz)   Height:        Intake/Output Summary (Last 24 hours) at 03/20/17 1728 Last data filed at 03/20/17 1537  Gross per 24 hour  Intake              350 ml  Output              865 ml  Net             -515 ml   Filed Weights   03/17/17 1711 03/20/17 0500  Weight: 68 kg (150 lb) 77.5 kg (170 lb 12.8 oz)    Examination: Exam unchanged from 03/19/2017  General exam: Appears calm and comfortable  Respiratory system: improved aeration at right lung field, equal chest rise, no wheezes Cardiovascular system: S1 & S2 heard, RRR. No JVD, murmurs, rubs or gallops  Gastrointestinal system: Abdomen is nondistended, soft and nontender. No organomegaly or masses felt. Normal bowel sounds heard. Central nervous system: Alert and oriented. No focal neurological deficits. Extremities: Symmetric 5 x 5 power. Skin: No rashes, lesions or ulcers on limited exam ,  Psychiatry: Mood & affect appropriate.   Data Reviewed: I have personally reviewed following labs and imaging studies  CBC:  Recent Labs Lab 03/17/17 1725  WBC 10.2  NEUTROABS 7.1  HGB 9.4*  HCT 31.2*  MCV 70.6*  PLT 385   Basic Metabolic Panel:  Recent Labs Lab 03/17/17 1725 03/19/17 0532  NA 140 138  K 3.7 3.9  CL 105 103  CO2 27 26  GLUCOSE 114*  69  BUN 15 18  CREATININE 1.14 0.97  CALCIUM 8.4* 8.1*   GFR: Estimated Creatinine Clearance: 72.1 mL/min (by C-G formula based on SCr of 0.97 mg/dL). Liver Function Tests:  Recent Labs Lab 03/17/17 1725  AST 17  ALT 8*  ALKPHOS 56  BILITOT 0.5  PROT 5.4*  ALBUMIN 3.0*   No results for input(s): LIPASE, AMYLASE in the last 168 hours. No results for input(s): AMMONIA in the last 168 hours. Coagulation Profile: No results for input(s): INR, PROTIME in the last 168 hours. Cardiac Enzymes: No results for input(s): CKTOTAL, CKMB, CKMBINDEX, TROPONINI in the last 168 hours. BNP (last 3 results) No results for input(s): PROBNP in  the last 8760 hours. HbA1C: No results for input(s): HGBA1C in the last 72 hours. CBG: No results for input(s): GLUCAP in the last 168 hours. Lipid Profile: No results for input(s): CHOL, HDL, LDLCALC, TRIG, CHOLHDL, LDLDIRECT in the last 72 hours. Thyroid Function Tests: No results for input(s): TSH, T4TOTAL, FREET4, T3FREE, THYROIDAB in the last 72 hours. Anemia Panel:  Recent Labs  03/17/17 2303 03/17/17 2304  VITAMINB12 854  --   FOLATE  --  9.8  FERRITIN 71  --   TIBC 227*  --   IRON 18*  --   RETICCTPCT 1.9  --    Sepsis Labs:  Recent Labs Lab 03/17/17 2303 03/19/17 0532  PROCALCITON <0.10 <0.10    Recent Results (from the past 240 hour(s))  Culture, blood (routine x 2) Call MD if unable to obtain prior to antibiotics being given     Status: None (Preliminary result)   Collection Time: 03/17/17 10:56 PM  Result Value Ref Range Status   Specimen Description BLOOD RIGHT ANTECUBITAL  Final   Special Requests   Final    BOTTLES DRAWN AEROBIC AND ANAEROBIC Blood Culture adequate volume   Culture NO GROWTH 3 DAYS  Final   Report Status PENDING  Incomplete  Culture, blood (routine x 2) Call MD if unable to obtain prior to antibiotics being given     Status: None (Preliminary result)   Collection Time: 03/17/17 11:06 PM  Result Value Ref Range Status   Specimen Description BLOOD LEFT ANTECUBITAL  Final   Special Requests   Final    BOTTLES DRAWN AEROBIC ONLY Blood Culture adequate volume   Culture NO GROWTH 3 DAYS  Final   Report Status PENDING  Incomplete  MRSA PCR Screening     Status: Abnormal   Collection Time: 03/18/17 12:07 AM  Result Value Ref Range Status   MRSA by PCR POSITIVE (A) NEGATIVE Final    Comment:        The GeneXpert MRSA Assay (FDA approved for NASAL specimens only), is one component of a comprehensive MRSA colonization surveillance program. It is not intended to diagnose MRSA infection nor to guide or monitor treatment for MRSA  infections. RESULT CALLED TO, READ BACK BY AND VERIFIED WITH: K.COBLE, RN 03/18/17 0200 L.CHAMPION   Gram stain     Status: None   Collection Time: 03/18/17  3:37 PM  Result Value Ref Range Status   Specimen Description PLEURAL RIGHT  Final   Special Requests NONE  Final   Gram Stain   Final    ABUNDANT WBC PRESENT,BOTH PMN AND MONONUCLEAR NO ORGANISMS SEEN    Report Status 03/18/2017 FINAL  Final  Culture, body fluid-bottle     Status: None (Preliminary result)   Collection Time: 03/18/17  3:37 PM  Result Value Ref  Range Status   Specimen Description PLEURAL RIGHT  Final   Special Requests NONE  Final   Culture NO GROWTH 2 DAYS  Final   Report Status PENDING  Incomplete         Radiology Studies: Dg Chest 2 View  Result Date: 03/19/2017 CLINICAL DATA:  Shortness of breath, COPD, peripheral arterial disease EXAM: CHEST  2 VIEW COMPARISON:  None available FINDINGS: Stable mild cardiomegaly with central vascular congestion. Stable mild interstitial edema pattern with minor basilar atelectasis and small pleural effusions. No pneumothorax. Trachea is midline. Aorta is atherosclerotic. IMPRESSION: Cardiomegaly with mild interstitial edema pattern and small pleural effusions. Little interval change. Thoracic aortic atherosclerosis Electronically Signed   By: Judie Petit.  Shick M.D.   On: 03/19/2017 11:07    Scheduled Meds: . allopurinol  100 mg Oral Daily  . aspirin  81 mg Oral Daily  . Chlorhexidine Gluconate Cloth  6 each Topical Q0600  . cholecalciferol  1,000 Units Oral Daily  . clopidogrel  75 mg Oral Daily  . DULoxetine  20 mg Oral Daily  . enoxaparin (LOVENOX) injection  40 mg Subcutaneous Q24H  . famotidine  20 mg Oral Daily  . gabapentin  200 mg Oral QHS  . levothyroxine  12.5 mcg Oral QAC breakfast  . meloxicam  7.5 mg Oral Daily  . methocarbamol  500 mg Oral BID  . multivitamin with minerals  1 tablet Oral Daily  . mupirocin ointment  1 application Nasal BID  . pantoprazole   40 mg Oral Daily  . polyethylene glycol  17 g Oral Daily  . saccharomyces boulardii  250 mg Oral BID  . sodium chloride flush  3 mL Intravenous Q12H  . traZODone  50 mg Oral QHS   Continuous Infusions: . sodium chloride    . ceFEPime (MAXIPIME) IV Stopped (03/20/17 0837)  . vancomycin Stopped (03/20/17 1041)     LOS: 3 days    Time spent: > 35 minutes  Penny Pia, MD [facility] Pager [phone]  If 7PM-7AM, please contact night-coverage [email or web address] Password [facility] 03/20/2017, 5:28 PM

## 2017-03-21 LAB — BASIC METABOLIC PANEL
ANION GAP: 7 (ref 5–15)
BUN: 16 mg/dL (ref 6–20)
CALCIUM: 8.3 mg/dL — AB (ref 8.9–10.3)
CHLORIDE: 103 mmol/L (ref 101–111)
CO2: 27 mmol/L (ref 22–32)
Creatinine, Ser: 0.97 mg/dL (ref 0.61–1.24)
GFR calc non Af Amer: >60 mL/min (ref >60)
Glucose, Bld: 76 mg/dL (ref 65–99)
Potassium: 3.9 mmol/L (ref 3.5–5.1)
Sodium: 137 mmol/L (ref 135–145)

## 2017-03-21 LAB — CBC
HEMATOCRIT: 31.4 % — AB (ref 39.0–52.0)
HEMOGLOBIN: 9.6 g/dL — AB (ref 13.0–17.0)
MCH: 21.7 pg — ABNORMAL LOW (ref 26.0–34.0)
MCHC: 30.6 g/dL (ref 30.0–36.0)
MCV: 71 fL — ABNORMAL LOW (ref 78.0–100.0)
Platelets: 370 10*3/uL (ref 150–400)
RBC: 4.42 MIL/uL (ref 4.22–5.81)
RDW: 17.6 % — AB (ref 11.5–15.5)
WBC: 10.4 10*3/uL (ref 4.0–10.5)

## 2017-03-21 LAB — PROCALCITONIN

## 2017-03-21 MED ORDER — LEVOFLOXACIN 750 MG PO TABS
750.0000 mg | ORAL_TABLET | Freq: Every day | ORAL | Status: DC
  Administered 2017-03-21 – 2017-03-22 (×2): 750 mg via ORAL
  Filled 2017-03-21 (×2): qty 1

## 2017-03-21 NOTE — Progress Notes (Signed)
PROGRESS NOTE    Caleb Hays  ZOX:[id number] DOB: October 29, 1940 DOA: 03/17/2017 PCP: Oneal Grout, MD    Brief Narrative:   77 y.o. male with medical history significant for COPD, hypothyroidism, peripheral arterial disease, coronary artery disease, depression, and chronic pain, now presenting to the emergency department for evaluation of shortness of breath and cough. Patient reports that he has had a productive cough with dyspnea for the past 2 months, reports improving some initially when he was treated with Augmentin, but has since worsened. He denies fevers or chills, but reports dyspnea with minimal exertion and cough productive of thick white sputum.    Assessment & Plan:   Principal Problem:   HCAP (healthcare-associated pneumonia) - will de escalate to levaquin - Pleural fluid evaluated and reporting nor organisms seen on Gram stain - We'll de-escalate antibiotic regimen most likely next a.m.  Active Problems:    COPD (chronic obstructive pulmonary disease) (HCC) - Stable currently, continue as needed bronchodilators    Hypothyroidism -Stable continue Synthroid    PAD (peripheral artery disease) (HCC) -Continue Plavix and aspirin.    Loculated pleural effusion - Order placed for thoracentesis.  - Please see above    Chronic pain - Stable continue supportive therapy   DVT prophylaxis: Lovenox Code Status: Full Family Communication: No family at bedside Disposition Plan: Pending improvement in condition   Consultants:   none   Procedures: thoracentesis   Antimicrobials: levaquin   Subjective: Patient reports no new complaints today.  Objective: Vitals:   03/20/17 0500 03/20/17 1534 03/20/17 2142 03/21/17 0503  BP:  (!) 123/52 117/60 118/62  Pulse:  (!) 56 (!) 43 (!) 51  Resp:  16 16 17   Temp:  97.8 F (36.6 C) (!) 97.4 F (36.3 C) 97.7 F (36.5 C)  TempSrc:  Oral Oral Oral  SpO2:  100% 100% 100%  Weight: 77.5 kg (170 lb 12.8 oz)   77.1  kg (170 lb)  Height:        Intake/Output Summary (Last 24 hours) at 03/21/17 1442 Last data filed at 03/21/17 0900  Gross per 24 hour  Intake              360 ml  Output              975 ml  Net             -615 ml   Filed Weights   03/17/17 1711 03/20/17 0500 03/21/17 0503  Weight: 68 kg (150 lb) 77.5 kg (170 lb 12.8 oz) 77.1 kg (170 lb)    Examination: Exam unchanged from 03/20/2017  General exam: Appears calm and comfortable  Respiratory system: improved aeration at right lung field, equal chest rise, no wheezes Cardiovascular system: S1 & S2 heard, RRR. No JVD, murmurs, rubs or gallops  Gastrointestinal system: Abdomen is nondistended, soft and nontender. No organomegaly or masses felt. Normal bowel sounds heard. Central nervous system: Alert and oriented. No focal neurological deficits. Extremities: Symmetric 5 x 5 power. Skin: No rashes, lesions or ulcers on limited exam ,  Psychiatry: Mood & affect appropriate.   Data Reviewed: I have personally reviewed following labs and imaging studies  CBC:  Recent Labs Lab 03/17/17 1725 03/21/17 0407  WBC 10.2 10.4  NEUTROABS 7.1  --   HGB 9.4* 9.6*  HCT 31.2* 31.4*  MCV 70.6* 71.0*  PLT 385 370   Basic Metabolic Panel:  Recent Labs Lab 03/17/17 1725 03/19/17 0532 03/21/17 0407  NA 140 138  137  K 3.7 3.9 3.9  CL 105 103 103  CO2 27 26 27   GLUCOSE 114* 69 76  BUN 15 18 16   CREATININE 1.14 0.97 0.97  CALCIUM 8.4* 8.1* 8.3*   GFR: Estimated Creatinine Clearance: 71.8 mL/min (by C-G formula based on SCr of 0.97 mg/dL). Liver Function Tests:  Recent Labs Lab 03/17/17 1725  AST 17  ALT 8*  ALKPHOS 56  BILITOT 0.5  PROT 5.4*  ALBUMIN 3.0*   No results for input(s): LIPASE, AMYLASE in the last 168 hours. No results for input(s): AMMONIA in the last 168 hours. Coagulation Profile: No results for input(s): INR, PROTIME in the last 168 hours. Cardiac Enzymes: No results for input(s): CKTOTAL, CKMB,  CKMBINDEX, TROPONINI in the last 168 hours. BNP (last 3 results) No results for input(s): PROBNP in the last 8760 hours. HbA1C: No results for input(s): HGBA1C in the last 72 hours. CBG: No results for input(s): GLUCAP in the last 168 hours. Lipid Profile: No results for input(s): CHOL, HDL, LDLCALC, TRIG, CHOLHDL, LDLDIRECT in the last 72 hours. Thyroid Function Tests: No results for input(s): TSH, T4TOTAL, FREET4, T3FREE, THYROIDAB in the last 72 hours. Anemia Panel: No results for input(s): VITAMINB12, FOLATE, FERRITIN, TIBC, IRON, RETICCTPCT in the last 72 hours. Sepsis Labs:  Recent Labs Lab 03/17/17 2303 03/19/17 0532 03/21/17 0407  PROCALCITON <0.10 <0.10 <0.10    Recent Results (from the past 240 hour(s))  Culture, blood (routine x 2) Call MD if unable to obtain prior to antibiotics being given     Status: None (Preliminary result)   Collection Time: 03/17/17 10:56 PM  Result Value Ref Range Status   Specimen Description BLOOD RIGHT ANTECUBITAL  Final   Special Requests   Final    BOTTLES DRAWN AEROBIC AND ANAEROBIC Blood Culture adequate volume   Culture NO GROWTH 4 DAYS  Final   Report Status PENDING  Incomplete  Culture, blood (routine x 2) Call MD if unable to obtain prior to antibiotics being given     Status: None (Preliminary result)   Collection Time: 03/17/17 11:06 PM  Result Value Ref Range Status   Specimen Description BLOOD LEFT ANTECUBITAL  Final   Special Requests   Final    BOTTLES DRAWN AEROBIC ONLY Blood Culture adequate volume   Culture NO GROWTH 4 DAYS  Final   Report Status PENDING  Incomplete  MRSA PCR Screening     Status: Abnormal   Collection Time: 03/18/17 12:07 AM  Result Value Ref Range Status   MRSA by PCR POSITIVE (A) NEGATIVE Final    Comment:        The GeneXpert MRSA Assay (FDA approved for NASAL specimens only), is one component of a comprehensive MRSA colonization surveillance program. It is not intended to diagnose  MRSA infection nor to guide or monitor treatment for MRSA infections. RESULT CALLED TO, READ BACK BY AND VERIFIED WITH: K.COBLE, RN 03/18/17 0200 L.CHAMPION   Gram stain     Status: None   Collection Time: 03/18/17  3:37 PM  Result Value Ref Range Status   Specimen Description PLEURAL RIGHT  Final   Special Requests NONE  Final   Gram Stain   Final    ABUNDANT WBC PRESENT,BOTH PMN AND MONONUCLEAR NO ORGANISMS SEEN    Report Status 03/18/2017 FINAL  Final  Culture, body fluid-bottle     Status: None (Preliminary result)   Collection Time: 03/18/17  3:37 PM  Result Value Ref Range Status   Specimen  Description PLEURAL RIGHT  Final   Special Requests NONE  Final   Culture NO GROWTH 3 DAYS  Final   Report Status PENDING  Incomplete         Radiology Studies: No results found.  Scheduled Meds: . allopurinol  100 mg Oral Daily  . aspirin  81 mg Oral Daily  . Chlorhexidine Gluconate Cloth  6 each Topical Q0600  . cholecalciferol  1,000 Units Oral Daily  . clopidogrel  75 mg Oral Daily  . DULoxetine  20 mg Oral Daily  . enoxaparin (LOVENOX) injection  40 mg Subcutaneous Q24H  . famotidine  20 mg Oral Daily  . gabapentin  200 mg Oral QHS  . levofloxacin  750 mg Oral Daily  . levothyroxine  12.5 mcg Oral QAC breakfast  . meloxicam  7.5 mg Oral Daily  . methocarbamol  500 mg Oral BID  . multivitamin with minerals  1 tablet Oral Daily  . mupirocin ointment  1 application Nasal BID  . pantoprazole  40 mg Oral Daily  . polyethylene glycol  17 g Oral Daily  . saccharomyces boulardii  250 mg Oral BID  . sodium chloride flush  3 mL Intravenous Q12H  . traZODone  50 mg Oral QHS   Continuous Infusions: . sodium chloride       LOS: 4 days    Time spent: > 35 minutes  Penny PiaVEGA, Adream Parzych, MD [facility] Pager [phone]  If 7PM-7AM, please contact night-coverage [email or web address] Password [email or web address] 03/21/2017, 2:42 PM

## 2017-03-21 NOTE — Progress Notes (Signed)
Pharmacy Antibiotic Note  Caleb Hays is a 76 y.o. male admitted on 03/17/2017 with pneumonia.  Pharmacy has been consulted for levofloxacin dosing.  De-escalated this AM, improving on abx, renal function stable.  Plan: Start levofloxacin 750mg  PO q24h D/c vanc/cefepime F/u LOT, clinical progression   Height: 6\' 4"  (193 cm) Weight: 170 lb (77.1 kg) IBW/kg (Calculated) : 86.8  Temp (24hrs), Avg:97.6 F (36.4 C), Min:97.4 F (36.3 C), Max:97.8 F (36.6 C)   Recent Labs Lab 03/17/17 1725 03/19/17 0532 03/20/17 1923 03/21/17 0407  WBC 10.2  --   --  10.4  CREATININE 1.14 0.97  --  0.97  VANCOTROUGH  --   --  15  --     Estimated Creatinine Clearance: 71.8 mL/min (by C-G formula based on SCr of 0.97 mg/dL).    No Known Allergies  Antimicrobials this admission: . Cefepime 8/31>>9/3 . Vanc 8/31>>9/3 . Levaquin 9/3>  Dose adjustments this admission: No adjustments VL 9/2>15 @goal   Microbiology results: 8/31 MRSA PCR; positive - mupirocin 8/30 Blood x 2 : ngtd 8/31 Body fluid : ngtd   Caleb PoseyJonathan Airiel Hays, PharmD Pharmacy Resident Pager #: [phone] 03/21/2017 10:35 AM  03/21/2017 10:34 AM

## 2017-03-22 LAB — CBC
HCT: 36.5 % — ABNORMAL LOW (ref 39.0–52.0)
Hemoglobin: 10.6 g/dL — ABNORMAL LOW (ref 13.0–17.0)
MCH: 20.8 pg — ABNORMAL LOW (ref 26.0–34.0)
MCHC: 29 g/dL — ABNORMAL LOW (ref 30.0–36.0)
MCV: 71.7 fL — ABNORMAL LOW (ref 78.0–100.0)
PLATELETS: 423 10*3/uL — AB (ref 150–400)
RBC: 5.09 MIL/uL (ref 4.22–5.81)
RDW: 17.8 % — ABNORMAL HIGH (ref 11.5–15.5)
WBC: 14.7 10*3/uL — AB (ref 4.0–10.5)

## 2017-03-22 LAB — BASIC METABOLIC PANEL
Anion gap: 8 (ref 5–15)
BUN: 15 mg/dL (ref 6–20)
CHLORIDE: 102 mmol/L (ref 101–111)
CO2: 28 mmol/L (ref 22–32)
CREATININE: 1.05 mg/dL (ref 0.61–1.24)
Calcium: 8.5 mg/dL — ABNORMAL LOW (ref 8.9–10.3)
Glucose, Bld: 84 mg/dL (ref 65–99)
POTASSIUM: 4.2 mmol/L (ref 3.5–5.1)
SODIUM: 138 mmol/L (ref 135–145)

## 2017-03-22 LAB — CULTURE, BLOOD (ROUTINE X 2)
Culture: NO GROWTH
Culture: NO GROWTH
SPECIAL REQUESTS: ADEQUATE
SPECIAL REQUESTS: ADEQUATE

## 2017-03-22 MED ORDER — LEVOFLOXACIN 500 MG PO TABS: 500.0000 mg | ORAL_TABLET | Freq: Every day | ORAL | 0 refills | Status: AC

## 2017-03-22 NOTE — Progress Notes (Signed)
Patient will DC to: [facility] Anticipated DC date: 03/22/17 Family notified: Patient declined Transport by: Sharin MonsPTAR   Per MD patient ready for [facility]. RN, patient, patient's family, and facility notified of DC. Discharge Summary sent to facility. RN given number for report. DC packet on chart. Ambulance transport requested for patient.   CSW signing off.  Cristobal GoldmannNadia Prithvi Kooi, [place] Clinical Social Worker [phone]

## 2017-03-22 NOTE — Progress Notes (Signed)
Caleb DuboisJerald J Hays to be D/C'd Skilled nursing facility per MD order.  Discussed with the patient and all questions fully answered.  VSS, IV catheter discontinued intact. Site without signs and symptoms of complications. Dressing and pressure applied.  [facility]. All questions answered.  Patient D/C'd via PTAR.  [name] 03/22/2017 5:17 PM

## 2017-03-22 NOTE — Clinical Social Work Note (Signed)
Clinical Social Work Assessment  Patient Details  Name: Caleb Hays MRN: [id number] Date of Birth: [birth year]  Date of referral:  03/22/17               Reason for consult:  Facility Placement                Permission sought to share information with:  Facility Medical sales representativeContact Representative, Family Supports Permission granted to share information::  Yes, Verbal Permission Granted  Name::     [name]  Agency::  [facility]  Relationship::  Niece  Contact Information:  [phone]  Housing/Transportation Living arrangements for the past 2 months:  Skilled Nursing Facility Source of Information:  Patient Patient Interpreter Needed:  None Criminal Activity/Legal Involvement Pertinent to Current Situation/Hospitalization:  No - Comment as needed Significant Relationships:  Other Family Members Lives with:  Facility Resident Do you feel safe going back to the place where you live?  Yes Need for family participation in patient care:  No (Coment)  Care giving concerns:  CSW received consult for possible SNF placement at time of discharge. CSW spoke with patient regarding PT recommendation of SNF placement at time of discharge. Patient reported that he has [facility] for a while now and would like to return there at discharge. CSW to continue to follow and assist with discharge planning needs.   Social Worker assessment / plan:  CSW spoke with patient concerning possibility of returning to SNF at discharge.  Employment status:  Retired Database administratornsurance information:  [place] PT Recommendations:  Not assessed at this time Information / Referral to community resources:  Skilled Nursing Facility  Patient/Family's Response to care:  Patient recognizes need for returning to [facility] and gave CSW permission to contact his niece.  Patient/Family's Understanding of and Emotional Response to Diagnosis, Current Treatment, and Prognosis:   Patient/family is realistic regarding therapy needs and expressed being hopeful for return to SNF placement. Patient expressed understanding of CSW role and discharge process as well as medical condition. No questions/concerns about plan or treatment.    Emotional Assessment Appearance:  Appears stated age Attitude/Demeanor/Rapport:  Other (Appropriate) Affect (typically observed):  Accepting, Appropriate Orientation:  Oriented to Self, Oriented to Situation, Oriented to Place, Oriented to  Time Alcohol / Substance use:  Not Applicable Psych involvement (Current and /or in the community):  No (Comment)  Discharge Needs  Concerns to be addressed:  Care Coordination Readmission within the last 30 days:  No Current discharge risk:  None Barriers to Discharge:  Continued Medical Work up   [employer] S Zahmir Lalla, LCSWA 03/22/2017, 8:57 AM

## 2017-03-22 NOTE — Discharge Summary (Signed)
Physician Discharge Summary  Caleb Hays ZOX:[id number] DOB: 03-Apr-1941 DOA: 03/17/2017  PCP: Oneal Grout, MD  Admit date: 03/17/2017 Discharge date: 03/22/2017  Time spent: > 35 minutes  Recommendations for Outpatient Follow-up:  1. Please set up outpatient evaluation with pulmonologist. Have them follow up with pleural fluid studies 2. Discharge on levaquin to complete 4 more days (10 day total   Discharge Diagnoses:  Principal Problem:   HCAP (healthcare-associated pneumonia) Active Problems:   COPD (chronic obstructive pulmonary disease) (HCC)   Hypothyroidism   PAD (peripheral artery disease) (HCC)   Loculated pleural effusion   Chronic pain   Discharge Condition: stable  Diet recommendation: Heart healthy diet  Filed Weights   03/20/17 0500 03/21/17 0503 03/22/17 0500  Weight: 77.5 kg (170 lb 12.8 oz) 77.1 kg (170 lb) 76.8 kg (169 lb 6.4 oz)    History of present illness:  75 y.o. male with medical history significant for COPD, hypothyroidism, peripheral arterial disease, coronary artery disease, depression, and chronic pain, now presenting to the emergency department for evaluation of shortness of breath and cough. Patient was found to have pleural effusion which was drained by IR.   Hospital Course:   Pleural effusion - Patient had 2 L drained by IR. No growth on Gram stain of pleural effusion. Patient preferred to have further evaluation as outpatient and was requesting discharge. His condition was stable on supplemental oxygen and he was evaluated several days after procedure as such agreed the patient can have outpatient evaluation with pulmonology for further evaluation recommendations.  Pneumonia - Improving on Levaquin but patient was initially treated with IV antibiotics of which he improved  Brother known medical conditions we'll continue prior to hospitalization medication regimen  Procedures:  Thoracentesis  Consultations:  IR  Discharge  Exam: Vitals:   03/22/17 0643 03/22/17 1450  BP:  (!) 144/64  Pulse: 61 62  Resp:  18  Temp:  98 F (36.7 C)  SpO2:  100%    General: Pt in nad, alert and awake Cardiovascular: rrr, no rubs Respiratory: no increased wob, no wheezes  Discharge Instructions   Discharge Instructions    Call MD for:  severe uncontrolled pain    Complete by:  As directed    Call MD for:  temperature >100.4    Complete by:  As directed    Diet - low sodium heart healthy    Complete by:  As directed    Discharge instructions    Complete by:  As directed    Please ensure you follow-up with pulmonologist and have him follow-up with the pleural effusion studies performed here in the hospital   Increase activity slowly    Complete by:  As directed      Current Discharge Medication List    START taking these medications   Details  levofloxacin (LEVAQUIN) 500 MG tablet Take 1 tablet (500 mg total) by mouth daily. Qty: 4 tablet, Refills: 0      CONTINUE these medications which have NOT CHANGED   Details  albuterol (PROVENTIL HFA;VENTOLIN HFA) 108 (90 BASE) MCG/ACT inhaler Inhale 2 puffs into the lungs 2 (two) times daily. 0900 & 1700    allopurinol (ZYLOPRIM) 100 MG tablet Take 100 mg by mouth daily.     aspirin 81 MG chewable tablet Chew 81 mg by mouth daily.    cholecalciferol (VITAMIN D) 1000 units tablet Take 1,000 Units by mouth daily. (0900)    clopidogrel (PLAVIX) 75 MG tablet Take 1 tablet (75  mg total) by mouth daily with breakfast. Qty: 30 tablet, Refills: 6    gabapentin (NEURONTIN) 100 MG capsule Take 200 mg by mouth at bedtime.     guaiFENesin (MUCINEX) 600 MG 12 hr tablet Take 1,200 mg by mouth 2 (two) times daily as needed for cough.    ipratropium-albuterol (DUONEB) 0.5-2.5 (3) MG/3ML SOLN Take 3 mLs by nebulization 4 (four) times daily as needed (for shortness of breath).    levothyroxine (SYNTHROID, LEVOTHROID) 25 MCG tablet Take 12.5 mcg by mouth daily at 6 (six) AM.      loperamide (IMODIUM A-D) 2 MG tablet Take 2-4 mg by mouth every 4 (four) hours as needed for diarrhea or loose stools (Do not give if <3 stools or DX. w/c. diff or norovirus. Do not exceed 16 mg/24hrs.).     meloxicam (MOBIC) 7.5 MG tablet Take 7.5 mg by mouth daily.     methocarbamol (ROBAXIN) 500 MG tablet Take 500 mg by mouth every 6 (six) hours as needed for muscle spasms.     Multiple Vitamins-Minerals (MULTIVITAMIN WITH MINERALS) tablet Take 1 tablet by mouth daily. (0900)    omeprazole (PRILOSEC) 20 MG capsule Take 20 mg by mouth daily at 6 (six) AM.     saccharomyces boulardii (FLORASTOR) 250 MG capsule Take 250 mg by mouth 2 (two) times daily.    senna (SENOKOT) 8.6 MG tablet Take 1 tablet by mouth daily.    traMADol (ULTRAM) 50 MG tablet Take 50 mg by mouth 2 (two) times daily.    traZODone (DESYREL) 50 MG tablet Take 50 mg by mouth at bedtime.     oxyCODONE (OXY IR/ROXICODONE) 5 MG immediate release tablet Take 1-2 tablets (5-10 mg total) by mouth every 4 (four) hours as needed (5mg  for moderate pain, 10mg  for severe pain). Qty: 15 tablet, Refills: 0      STOP taking these medications     amoxicillin-clavulanate (AUGMENTIN) 875-125 MG tablet      DULoxetine (CYMBALTA) 20 MG capsule      polyethylene glycol (MIRALAX / GLYCOLAX) packet        No Known Allergies    The results of significant diagnostics from this hospitalization (including imaging, microbiology, ancillary and laboratory) are listed below for reference.    Significant Diagnostic Studies: Dg Chest 1 View  Result Date: 03/18/2017 CLINICAL DATA:  Follow-up examination status post right-sided thoracentesis. EXAM: CHEST 1 VIEW COMPARISON:  Prior CT from 03/17/2017. FINDINGS: Stable cardiomegaly. Mediastinal silhouette within normal limits. Aortic atherosclerosis noted. Lungs normally inflated. Small residual right-sided pleural effusion, improved from previous. No pneumothorax. No new focal  infiltrates. Diffuse interstitial prominence without frank pulmonary edema. Osseous structures unchanged. IMPRESSION: 1. Small residual right pleural effusion status post thoracentesis, improved from previous. No pneumothorax. 2. Stable cardiomegaly with aortic atherosclerosis. Electronically Signed   By: Rise Mu M.D.   On: 03/18/2017 15:45   Dg Chest 2 View  Result Date: 03/19/2017 CLINICAL DATA:  Shortness of breath, COPD, peripheral arterial disease EXAM: CHEST  2 VIEW COMPARISON:  None available FINDINGS: Stable mild cardiomegaly with central vascular congestion. Stable mild interstitial edema pattern with minor basilar atelectasis and small pleural effusions. No pneumothorax. Trachea is midline. Aorta is atherosclerotic. IMPRESSION: Cardiomegaly with mild interstitial edema pattern and small pleural effusions. Little interval change. Thoracic aortic atherosclerosis Electronically Signed   By: Judie Petit.  Shick M.D.   On: 03/19/2017 11:07   Dg Chest 2 View  Result Date: 03/17/2017 CLINICAL DATA:  Recent pneumonia. EXAM: CHEST  2 VIEW COMPARISON:  June 23, 2015 FINDINGS: No pneumothorax. There is a rounded opacity in the lateral right lung base. There is an air-fluid level seen on frontal and lateral views. Cardiomegaly identified. The hila and mediastinum are unremarkable. No other suspicious findings. IMPRESSION: 1. There is a rounded masslike opacity in the lateral right lung base with an air-fluid level along its superior margin. There is clearly a component of fluid. This could be sequela from recent infection. However a necrotic mass or loculated collection of pleural air and gas is possible. Recommend CT imaging for further evaluation. Findings called to Dr. Jacqulyn BathLong. Electronically Signed   By: Gerome Samavid  Williams III M.D   On: 03/17/2017 18:20   Ct Chest W Contrast  Result Date: 03/17/2017 CLINICAL DATA:  Shortness of breath. EXAM: CT CHEST WITH CONTRAST TECHNIQUE: Multidetector CT imaging of  the chest was performed during intravenous contrast administration. CONTRAST:  75mL ISOVUE-300 IOPAMIDOL (ISOVUE-300) INJECTION 61% COMPARISON:  Radiographs of same day. FINDINGS: Cardiovascular: Atherosclerosis of thoracic aorta is noted without aneurysm or dissection. Normal cardiac size. No pericardial effusion is noted. Coronary artery calcifications are noted. Mediastinum/Nodes: No enlarged mediastinal, hilar, or axillary lymph nodes. Thyroid gland, trachea, and esophagus demonstrate no significant findings. Lungs/Pleura: Large possibly loculated right pleural effusion is noted. Mild subsegmental atelectasis is noted in the right upper lobe. Small left pleural effusion is noted with associated subsegmental atelectasis. No pneumothorax is noted. Upper Abdomen: Severe splenomegaly is noted. Musculoskeletal: No chest wall abnormality. No acute or significant osseous findings. IMPRESSION: Coronary artery calcifications are noted suggesting coronary artery disease. Large probably loculated right pleural effusion is noted. Severe splenomegaly. Aortic Atherosclerosis (ICD10-I70.0). Electronically Signed   By: Lupita RaiderJames  Green Jr, M.D.   On: 03/17/2017 19:26   Ir Thoracentesis Asp Pleural Space W/img Guide  Result Date: 03/18/2017 INDICATION: Patient presents for shortness of breath and cough, found to have a large right pleural effusion. Request is made for diagnostic and therapeutic thoracentesis. EXAM: ULTRASOUND GUIDED DIAGNOSTIC AND THERAPEUTIC RIGHT THORACENTESIS MEDICATIONS: 10 mL 1% lidocaine COMPLICATIONS: None immediate. PROCEDURE: An ultrasound guided thoracentesis was thoroughly discussed with the patient and questions answered. The benefits, risks, alternatives and complications were also discussed. The patient understands and wishes to proceed with the procedure. Written consent was obtained. Ultrasound was performed to localize and mark an adequate pocket of fluid in the right chest. The area was then  prepped and draped in the normal sterile fashion. 1% Lidocaine was used for local anesthesia. Under ultrasound guidance a Safe-T-Centesis catheter was introduced. Thoracentesis was performed. The catheter was removed and a dressing applied. FINDINGS: A total of approximately 2.0 liters of yellow fluid was removed. Samples were sent to the laboratory as requested by the clinical team. IMPRESSION: Successful ultrasound guided diagnostic and therapeutic right thoracentesis yielding 2.0 liters of pleural fluid. No pneumothorax on follow-up chest radiograph. Read by:  Loyce DysKacie Matthews PA-C Electronically Signed   By: Corlis Leak  Hassell M.D.   On: 03/18/2017 15:26    Microbiology: Recent Results (from the past 240 hour(s))  Culture, blood (routine x 2) Call MD if unable to obtain prior to antibiotics being given     Status: None (Preliminary result)   Collection Time: 03/17/17 10:56 PM  Result Value Ref Range Status   Specimen Description BLOOD RIGHT ANTECUBITAL  Final   Special Requests   Final    BOTTLES DRAWN AEROBIC AND ANAEROBIC Blood Culture adequate volume   Culture NO GROWTH 4 DAYS  Final   Report  Status PENDING  Incomplete  Culture, blood (routine x 2) Call MD if unable to obtain prior to antibiotics being given     Status: None (Preliminary result)   Collection Time: 03/17/17 11:06 PM  Result Value Ref Range Status   Specimen Description BLOOD LEFT ANTECUBITAL  Final   Special Requests   Final    BOTTLES DRAWN AEROBIC ONLY Blood Culture adequate volume   Culture NO GROWTH 4 DAYS  Final   Report Status PENDING  Incomplete  MRSA PCR Screening     Status: Abnormal   Collection Time: 03/18/17 12:07 AM  Result Value Ref Range Status   MRSA by PCR POSITIVE (A) NEGATIVE Final    Comment:        The GeneXpert MRSA Assay (FDA approved for NASAL specimens only), is one component of a comprehensive MRSA colonization surveillance program. It is not intended to diagnose MRSA infection nor to guide  or monitor treatment for MRSA infections. RESULT CALLED TO, READ BACK BY AND VERIFIED WITH: K.COBLE, RN 03/18/17 0200 L.CHAMPION   Gram stain     Status: None   Collection Time: 03/18/17  3:37 PM  Result Value Ref Range Status   Specimen Description PLEURAL RIGHT  Final   Special Requests NONE  Final   Gram Stain   Final    ABUNDANT WBC PRESENT,BOTH PMN AND MONONUCLEAR NO ORGANISMS SEEN    Report Status 03/18/2017 FINAL  Final  Culture, body fluid-bottle     Status: None (Preliminary result)   Collection Time: 03/18/17  3:37 PM  Result Value Ref Range Status   Specimen Description PLEURAL RIGHT  Final   Special Requests NONE  Final   Culture NO GROWTH 3 DAYS  Final   Report Status PENDING  Incomplete     Labs: Basic Metabolic Panel:  Recent Labs Lab 03/17/17 1725 03/19/17 0532 03/21/17 0407 03/22/17 0850  NA 140 138 137 138  K 3.7 3.9 3.9 4.2  CL 105 103 103 102  CO2 27 26 27 28   GLUCOSE 114* 69 76 84  BUN 15 18 16 15   CREATININE 1.14 0.97 0.97 1.05  CALCIUM 8.4* 8.1* 8.3* 8.5*   Liver Function Tests:  Recent Labs Lab 03/17/17 1725  AST 17  ALT 8*  ALKPHOS 56  BILITOT 0.5  PROT 5.4*  ALBUMIN 3.0*   No results for input(s): LIPASE, AMYLASE in the last 168 hours. No results for input(s): AMMONIA in the last 168 hours. CBC:  Recent Labs Lab 03/17/17 1725 03/21/17 0407 03/22/17 0850  WBC 10.2 10.4 14.7*  NEUTROABS 7.1  --   --   HGB 9.4* 9.6* 10.6*  HCT 31.2* 31.4* 36.5*  MCV 70.6* 71.0* 71.7*  PLT 385 370 423*   Cardiac Enzymes: No results for input(s): CKTOTAL, CKMB, CKMBINDEX, TROPONINI in the last 168 hours. BNP: BNP (last 3 results)  Recent Labs  03/17/17 1725  BNP 1,406.0*    ProBNP (last 3 results) No results for input(s): PROBNP in the last 8760 hours.  CBG: No results for input(s): GLUCAP in the last 168 hours.   Signed:  Penny Pia MD.  [facility] 03/22/2017, 3:24 PM

## 2017-03-22 NOTE — NC FL2 (Signed)
[place] MEDICAID FL2 LEVEL OF CARE SCREENING TOOL     IDENTIFICATION  Patient Name: Caleb MarinerJerald J Hays Birthdate: October 08, 1940 Sex: male Admission Date (Current Location): 03/17/2017  [facility] [place]   Facility and Address:  [facility], [address], [place], [place] [address]      Provider Number: [id number]  Attending Physician Name and Address:  Penny PiaVega, [place], MD  Relative Name and Phone Number:  Leotis ShamesLauren, niece, [phone]    Current Level of Care: Hospital Recommended Level of Care: Skilled Nursing Facility Prior Approval Number:    Date Approved/Denied:   PASRR Number: [id number]  Discharge Plan: SNF    Current Diagnoses: Patient Active Problem List   Diagnosis Date Noted  . HCAP (healthcare-associated pneumonia) 03/17/2017  . Loculated pleural effusion 03/17/2017  . Chronic pain 03/17/2017  . S/P transmetatarsal amputation of foot, left (HCC) 10/08/2016  . PAD (peripheral artery disease) (HCC) 09/15/2016  . Edema 05/05/2016  . Phantom pain (HCC) 03/08/2016  . Gout 03/08/2016  . Hypothyroidism 03/08/2016  . CKD (chronic kidney disease) stage 3, GFR 30-59 ml/min 03/08/2016  . GERD without esophagitis 03/08/2016  . Constipation 12/29/2015  . Below knee amputation status (HCC) 10/01/2015  . Ankle pain 08/13/2015  . Adult failure to thrive   . PVD (peripheral vascular disease) (HCC)   . Elevated blood uric acid level 08/10/2015  . 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 Anemia of chronic disease 08/10/2015  . Malnutrition of moderate degree 06/24/2015  . Failure to thrive in adult 06/23/2015  . Weight loss 06/23/2015  . Tobacco abuse 06/23/2015  . COPD (chronic obstructive pulmonary disease) (HCC) 06/23/2015  . Fall   . Critical lower limb ischemia 05/31/2014  . Status post right foot surgery 03/12/2014    Orientation RESPIRATION BLADDER Height & Weight     Self, Time, Situation, Place  O2 (Nasal  cannula 3L) Continent Weight: 76.8 kg (169 lb 6.4 oz) Height:  6\' 4"  (193 cm)  BEHAVIORAL SYMPTOMS/MOOD NEUROLOGICAL BOWEL NUTRITION STATUS      Continent Diet (Please see DC Summary)  AMBULATORY STATUS COMMUNICATION OF NEEDS Skin   Limited Assist Verbally Normal                       Personal Care Assistance Level of Assistance  Bathing, Feeding, Dressing Bathing Assistance: Limited assistance Feeding assistance: Independent Dressing Assistance: Limited assistance     Functional Limitations Info             SPECIAL CARE FACTORS FREQUENCY                       Contractures      Additional Factors Info  Code Status, Allergies, Isolation Precautions, Psychotropic Code Status Info: Full Allergies Info: NKA Psychotropic Info: Cymbalta;Trazadone   Isolation Precautions Info: 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 0.9 %  sodium chloride infusion  250 mL Intravenous PRN Opyd, Lavone Neriimothy S, MD      . albuterol (PROVENTIL) (2.5 MG/3ML) 0.083% nebulizer solution 2.5 mg  2.5 mg Nebulization Q4H PRN Opyd, Lavone Neriimothy S, MD   2.5 mg at 03/18/17 1748  . allopurinol (ZYLOPRIM) tablet 100 mg  100 mg Oral Daily Opyd, Lavone Neriimothy S, MD   100 mg at 03/22/17 0815  .  aspirin chewable tablet 81 mg  81 mg Oral Daily Opyd, Lavone Neri, MD   81 mg at 03/22/17 0816  . Chlorhexidine Gluconate Cloth 2 % PADS 6 each  6 each Topical Q0600 Opyd, Lavone Neri, MD   6 each at 03/22/17 0538  . cholecalciferol (VITAMIN D) tablet 1,000 Units  1,000 Units Oral Daily Opyd, Lavone Neri, MD   1,000 Units at 03/22/17 0815  . clopidogrel (PLAVIX) tablet 75 mg  75 mg Oral Daily Opyd, Lavone Neri, MD   75 mg at 03/22/17 0815  . DULoxetine (CYMBALTA) DR capsule 20 mg  20 mg Oral Daily Opyd, Lavone Neri, MD   20 mg at 03/21/17 1028  . enoxaparin  (LOVENOX) injection 40 mg  40 mg Subcutaneous Q24H Opyd, Lavone Neri, MD   40 mg at 03/22/17 0816  . famotidine (PEPCID) tablet 20 mg  20 mg Oral Daily Opyd, Lavone Neri, MD   20 mg at 03/21/17 1028  . gabapentin (NEURONTIN) capsule 200 mg  200 mg Oral QHS Opyd, Lavone Neri, MD   200 mg at 03/21/17 2126  . levofloxacin (LEVAQUIN) tablet 750 mg  750 mg Oral Daily Penny Pia, MD   750 mg at 03/21/17 1512  . levothyroxine (SYNTHROID, LEVOTHROID) tablet 12.5 mcg  12.5 mcg Oral QAC breakfast Opyd, Lavone Neri, MD   12.5 mcg at 03/22/17 0815  . lidocaine (PF) (XYLOCAINE) 1 % injection    PRN Hoyt Koch, PA   10 mL at 03/18/17 1443  . meloxicam (MOBIC) tablet 7.5 mg  7.5 mg Oral Daily Opyd, Lavone Neri, MD   7.5 mg at 03/21/17 1030  . methocarbamol (ROBAXIN) tablet 500 mg  500 mg Oral BID Opyd, Lavone Neri, MD   500 mg at 03/21/17 2126  . multivitamin with minerals tablet 1 tablet  1 tablet Oral Daily Opyd, Lavone Neri, MD   1 tablet at 03/21/17 1512  . mupirocin ointment (BACTROBAN) 2 % 1 application  1 application Nasal BID Opyd, Lavone Neri, MD   1 application at 03/21/17 2130  . oxyCODONE (Oxy IR/ROXICODONE) immediate release tablet 5-10 mg  5-10 mg Oral Q4H PRN Opyd, Lavone Neri, MD      . pantoprazole (PROTONIX) EC tablet 40 mg  40 mg Oral Daily Opyd, Lavone Neri, MD   40 mg at 03/22/17 0816  . polyethylene glycol (MIRALAX / GLYCOLAX) packet 17 g  17 g Oral Daily Opyd, Lavone Neri, MD      . saccharomyces boulardii (FLORASTOR) capsule 250 mg  250 mg Oral BID Opyd, Lavone Neri, MD   250 mg at 03/21/17 2126  . sodium chloride flush (NS) 0.9 % injection 3 mL  3 mL Intravenous Q12H Opyd, Lavone Neri, MD   3 mL at 03/21/17 2127  . sodium chloride flush (NS) 0.9 % injection 3 mL  3 mL Intravenous PRN Opyd, Lavone Neri, MD      . traZODone (DESYREL) tablet 50 mg  50 mg Oral QHS Opyd, Lavone Neri, MD   50 mg at 03/21/17 2126     Discharge Medications: Please see discharge summary for a list of discharge  medications.  Relevant Imaging Results:  Relevant Lab Results:   Additional Information SSN: [id number] [place], [place]

## 2017-03-23 LAB — CULTURE, BODY FLUID W GRAM STAIN -BOTTLE: Culture: NO GROWTH

## 2017-03-23 LAB — CULTURE, BODY FLUID-BOTTLE

## 2017-03-30 ENCOUNTER — Telehealth: Payer: Self-pay | Admitting: Cardiology

## 2017-03-30 ENCOUNTER — Encounter (HOSPITAL_COMMUNITY): Payer: Self-pay

## 2017-03-30 ENCOUNTER — Other Ambulatory Visit: Payer: Self-pay

## 2017-03-30 ENCOUNTER — Telehealth (INDEPENDENT_AMBULATORY_CARE_PROVIDER_SITE_OTHER): Payer: Self-pay | Admitting: Orthopedic Surgery

## 2017-03-30 ENCOUNTER — Emergency Department (HOSPITAL_COMMUNITY): Payer: Medicare HMO

## 2017-03-30 ENCOUNTER — Inpatient Hospital Stay (HOSPITAL_COMMUNITY)
Admission: EM | Admit: 2017-03-30 | Discharge: 2017-04-06 | DRG: 981 | Disposition: A | Discharge status: Skilled Nursing Facility | Payer: Medicare HMO | Attending: Internal Medicine | Admitting: Internal Medicine

## 2017-03-30 DIAGNOSIS — Z87891 Personal history of nicotine dependence: Secondary | ICD-10-CM

## 2017-03-30 DIAGNOSIS — R7989 Other specified abnormal findings of blood chemistry: Secondary | ICD-10-CM | POA: Diagnosis present

## 2017-03-30 DIAGNOSIS — Z79891 Long term (current) use of opiate analgesic: Secondary | ICD-10-CM

## 2017-03-30 DIAGNOSIS — Z955 Presence of coronary angioplasty implant and graft: Secondary | ICD-10-CM

## 2017-03-30 DIAGNOSIS — M109 Gout, unspecified: Secondary | ICD-10-CM | POA: Diagnosis present

## 2017-03-30 DIAGNOSIS — J9611 Chronic respiratory failure with hypoxia: Secondary | ICD-10-CM | POA: Diagnosis not present

## 2017-03-30 DIAGNOSIS — Z89422 Acquired absence of other left toe(s): Secondary | ICD-10-CM

## 2017-03-30 DIAGNOSIS — J90 Pleural effusion, not elsewhere classified: Secondary | ICD-10-CM | POA: Diagnosis present

## 2017-03-30 DIAGNOSIS — Z9889 Other specified postprocedural states: Secondary | ICD-10-CM

## 2017-03-30 DIAGNOSIS — R0602 Shortness of breath: Secondary | ICD-10-CM

## 2017-03-30 DIAGNOSIS — I2510 Atherosclerotic heart disease of native coronary artery without angina pectoris: Secondary | ICD-10-CM | POA: Diagnosis not present

## 2017-03-30 DIAGNOSIS — D638 Anemia in other chronic diseases classified elsewhere: Secondary | ICD-10-CM | POA: Diagnosis present

## 2017-03-30 DIAGNOSIS — Z89421 Acquired absence of other right toe(s): Secondary | ICD-10-CM

## 2017-03-30 DIAGNOSIS — M1990 Unspecified osteoarthritis, unspecified site: Secondary | ICD-10-CM | POA: Diagnosis present

## 2017-03-30 DIAGNOSIS — Z79899 Other long term (current) drug therapy: Secondary | ICD-10-CM

## 2017-03-30 DIAGNOSIS — E039 Hypothyroidism, unspecified: Secondary | ICD-10-CM | POA: Diagnosis present

## 2017-03-30 DIAGNOSIS — I5043 Acute on chronic combined systolic (congestive) and diastolic (congestive) heart failure: Secondary | ICD-10-CM | POA: Diagnosis present

## 2017-03-30 DIAGNOSIS — D509 Iron deficiency anemia, unspecified: Secondary | ICD-10-CM | POA: Diagnosis present

## 2017-03-30 DIAGNOSIS — F329 Major depressive disorder, single episode, unspecified: Secondary | ICD-10-CM | POA: Diagnosis present

## 2017-03-30 DIAGNOSIS — I5041 Acute combined systolic (congestive) and diastolic (congestive) heart failure: Secondary | ICD-10-CM | POA: Diagnosis not present

## 2017-03-30 DIAGNOSIS — I739 Peripheral vascular disease, unspecified: Secondary | ICD-10-CM | POA: Diagnosis present

## 2017-03-30 DIAGNOSIS — J441 Chronic obstructive pulmonary disease with (acute) exacerbation: Secondary | ICD-10-CM | POA: Diagnosis present

## 2017-03-30 DIAGNOSIS — R946 Abnormal results of thyroid function studies: Secondary | ICD-10-CM | POA: Diagnosis present

## 2017-03-30 DIAGNOSIS — I361 Nonrheumatic tricuspid (valve) insufficiency: Secondary | ICD-10-CM | POA: Diagnosis not present

## 2017-03-30 DIAGNOSIS — J9621 Acute and chronic respiratory failure with hypoxia: Secondary | ICD-10-CM | POA: Diagnosis present

## 2017-03-30 DIAGNOSIS — R748 Abnormal levels of other serum enzymes: Secondary | ICD-10-CM | POA: Diagnosis not present

## 2017-03-30 DIAGNOSIS — Z7902 Long term (current) use of antithrombotics/antiplatelets: Secondary | ICD-10-CM

## 2017-03-30 DIAGNOSIS — Z23 Encounter for immunization: Secondary | ICD-10-CM

## 2017-03-30 DIAGNOSIS — I420 Dilated cardiomyopathy: Secondary | ICD-10-CM | POA: Diagnosis present

## 2017-03-30 DIAGNOSIS — I350 Nonrheumatic aortic (valve) stenosis: Secondary | ICD-10-CM | POA: Diagnosis not present

## 2017-03-30 DIAGNOSIS — E785 Hyperlipidemia, unspecified: Secondary | ICD-10-CM | POA: Diagnosis present

## 2017-03-30 DIAGNOSIS — J449 Chronic obstructive pulmonary disease, unspecified: Secondary | ICD-10-CM | POA: Diagnosis not present

## 2017-03-30 DIAGNOSIS — Z7982 Long term (current) use of aspirin: Secondary | ICD-10-CM

## 2017-03-30 DIAGNOSIS — I25110 Atherosclerotic heart disease of native coronary artery with unstable angina pectoris: Secondary | ICD-10-CM

## 2017-03-30 DIAGNOSIS — R778 Other specified abnormalities of plasma proteins: Secondary | ICD-10-CM

## 2017-03-30 DIAGNOSIS — Z89511 Acquired absence of right leg below knee: Secondary | ICD-10-CM

## 2017-03-30 DIAGNOSIS — I340 Nonrheumatic mitral (valve) insufficiency: Secondary | ICD-10-CM | POA: Diagnosis not present

## 2017-03-30 LAB — CBC WITH DIFFERENTIAL/PLATELET
BLASTS: 0 %
Band Neutrophils: 0 %
Basophils Absolute: 0 10*3/uL (ref 0.0–0.1)
Basophils Relative: 0 %
EOS PCT: 1 %
Eosinophils Absolute: 0.1 10*3/uL (ref 0.0–0.7)
HCT: 30.5 % — ABNORMAL LOW (ref 39.0–52.0)
HEMOGLOBIN: 9.5 g/dL — AB (ref 13.0–17.0)
Lymphocytes Relative: 35 %
Lymphs Abs: 2.6 10*3/uL (ref 0.7–4.0)
MCH: 21.5 pg — AB (ref 26.0–34.0)
MCHC: 31.1 g/dL (ref 30.0–36.0)
MCV: 69 fL — AB (ref 78.0–100.0)
MYELOCYTES: 0 %
Metamyelocytes Relative: 3 %
Monocytes Absolute: 0.9 10*3/uL (ref 0.1–1.0)
Monocytes Relative: 12 %
NEUTROS PCT: 49 %
Neutro Abs: 3.9 10*3/uL (ref 1.7–7.7)
Other: 0 %
PROMYELOCYTES ABS: 0 %
Platelets: 396 10*3/uL (ref 150–400)
RBC: 4.42 MIL/uL (ref 4.22–5.81)
RDW: 18.2 % — ABNORMAL HIGH (ref 11.5–15.5)
WBC: 7.5 10*3/uL (ref 4.0–10.5)
nRBC: 2 /100 WBC — ABNORMAL HIGH

## 2017-03-30 LAB — COMPREHENSIVE METABOLIC PANEL
ALT: 10 U/L — AB (ref 17–63)
AST: 17 U/L (ref 15–41)
Albumin: 3.2 g/dL — ABNORMAL LOW (ref 3.5–5.0)
Alkaline Phosphatase: 70 U/L (ref 38–126)
Anion gap: 8 (ref 5–15)
BUN: 20 mg/dL (ref 6–20)
CALCIUM: 8.6 mg/dL — AB (ref 8.9–10.3)
CHLORIDE: 104 mmol/L (ref 101–111)
CO2: 27 mmol/L (ref 22–32)
CREATININE: 1.17 mg/dL (ref 0.61–1.24)
GFR, EST NON AFRICAN AMERICAN: 59 mL/min — AB (ref >60)
Glucose, Bld: 81 mg/dL (ref 65–99)
Potassium: 4.5 mmol/L (ref 3.5–5.1)
Sodium: 139 mmol/L (ref 135–145)
Total Bilirubin: 0.7 mg/dL (ref 0.3–1.2)
Total Protein: 5.9 g/dL — ABNORMAL LOW (ref 6.5–8.1)

## 2017-03-30 LAB — BRAIN NATRIURETIC PEPTIDE: B Natriuretic Peptide: 1074.4 pg/mL — ABNORMAL HIGH (ref 0.0–100.0)

## 2017-03-30 LAB — TROPONIN I: Troponin I: 0.35 ng/mL (ref <0.03)

## 2017-03-30 MED ORDER — LEVOTHYROXINE SODIUM 25 MCG PO TABS
12.5000 ug | ORAL_TABLET | Freq: Every day | ORAL | Status: DC
  Administered 2017-03-31 – 2017-04-02 (×3): 12.5 ug via ORAL
  Administered 2017-04-03: 0.5 ug via ORAL
  Administered 2017-04-04: 12.5 ug via ORAL
  Filled 2017-03-30 (×5): qty 1

## 2017-03-30 MED ORDER — GUAIFENESIN ER 600 MG PO TB12: 1200.0000 mg | ORAL_TABLET | Freq: Two times a day (BID) | ORAL | Status: DC | PRN

## 2017-03-30 MED ORDER — SODIUM CHLORIDE 0.9% FLUSH: 3.0000 mL | INTRAVENOUS | Status: DC | PRN

## 2017-03-30 MED ORDER — OXYCODONE HCL 5 MG PO TABS
5.0000 mg | ORAL_TABLET | ORAL | Status: DC | PRN
  Administered 2017-04-02 – 2017-04-03 (×2): 5 mg via ORAL
  Filled 2017-03-30 (×2): qty 1

## 2017-03-30 MED ORDER — ENOXAPARIN SODIUM 40 MG/0.4ML ~~LOC~~ SOLN
40.0000 mg | Freq: Every day | SUBCUTANEOUS | Status: DC
  Administered 2017-03-31 (×2): 40 mg via SUBCUTANEOUS
  Filled 2017-03-30 (×2): qty 0.4

## 2017-03-30 MED ORDER — SACCHAROMYCES BOULARDII 250 MG PO CAPS
250.0000 mg | ORAL_CAPSULE | Freq: Two times a day (BID) | ORAL | Status: DC
  Administered 2017-03-31 – 2017-04-06 (×12): 250 mg via ORAL
  Filled 2017-03-30 (×13): qty 1

## 2017-03-30 MED ORDER — ALLOPURINOL 100 MG PO TABS
100.0000 mg | ORAL_TABLET | Freq: Every day | ORAL | Status: DC
  Administered 2017-03-31 – 2017-04-06 (×7): 100 mg via ORAL
  Filled 2017-03-30 (×7): qty 1

## 2017-03-30 MED ORDER — ONDANSETRON HCL 4 MG/2ML IJ SOLN: 4.0000 mg | Freq: Four times a day (QID) | INTRAMUSCULAR | Status: DC | PRN

## 2017-03-30 MED ORDER — SODIUM CHLORIDE 0.9% FLUSH
3.0000 mL | Freq: Two times a day (BID) | INTRAVENOUS | Status: DC
  Administered 2017-03-31 – 2017-04-04 (×7): 3 mL via INTRAVENOUS

## 2017-03-30 MED ORDER — VITAMIN D3 25 MCG (1000 UNIT) PO TABS
1000.0000 [IU] | ORAL_TABLET | Freq: Every day | ORAL | Status: DC
  Administered 2017-03-31 – 2017-04-06 (×7): 1000 [IU] via ORAL
  Filled 2017-03-30 (×9): qty 1

## 2017-03-30 MED ORDER — MELOXICAM 7.5 MG PO TABS
7.5000 mg | ORAL_TABLET | Freq: Every day | ORAL | Status: DC
  Administered 2017-03-31: 7.5 mg via ORAL
  Filled 2017-03-30 (×2): qty 1

## 2017-03-30 MED ORDER — METHOCARBAMOL 500 MG PO TABS: 500.0000 mg | ORAL_TABLET | Freq: Four times a day (QID) | ORAL | Status: DC | PRN

## 2017-03-30 MED ORDER — ADULT MULTIVITAMIN W/MINERALS CH
1.0000 | ORAL_TABLET | Freq: Every day | ORAL | Status: DC
  Administered 2017-03-31 – 2017-04-06 (×7): 1 via ORAL
  Filled 2017-03-30 (×7): qty 1

## 2017-03-30 MED ORDER — CLOPIDOGREL BISULFATE 75 MG PO TABS
75.0000 mg | ORAL_TABLET | Freq: Every day | ORAL | Status: DC
  Administered 2017-03-31 – 2017-04-04 (×5): 75 mg via ORAL
  Filled 2017-03-30 (×6): qty 1

## 2017-03-30 MED ORDER — ACETAMINOPHEN 325 MG PO TABS: 650.0000 mg | ORAL_TABLET | Freq: Four times a day (QID) | ORAL | Status: DC | PRN

## 2017-03-30 MED ORDER — SODIUM CHLORIDE 0.9% FLUSH
3.0000 mL | Freq: Two times a day (BID) | INTRAVENOUS | Status: DC
  Administered 2017-03-31 – 2017-04-04 (×2): 3 mL via INTRAVENOUS

## 2017-03-30 MED ORDER — GABAPENTIN 100 MG PO CAPS
200.0000 mg | ORAL_CAPSULE | Freq: Every day | ORAL | Status: DC
  Administered 2017-03-31 – 2017-04-05 (×7): 200 mg via ORAL
  Filled 2017-03-30 (×7): qty 2

## 2017-03-30 MED ORDER — IPRATROPIUM-ALBUTEROL 0.5-2.5 (3) MG/3ML IN SOLN
3.0000 mL | Freq: Four times a day (QID) | RESPIRATORY_TRACT | Status: DC
  Administered 2017-03-31 – 2017-04-05 (×19): 3 mL via RESPIRATORY_TRACT
  Filled 2017-03-30 (×21): qty 3

## 2017-03-30 MED ORDER — ASPIRIN 81 MG PO CHEW
324.0000 mg | CHEWABLE_TABLET | Freq: Once | ORAL | Status: AC
  Administered 2017-03-30: 324 mg via ORAL
  Filled 2017-03-30: qty 4

## 2017-03-30 MED ORDER — SODIUM CHLORIDE 0.9 % IV SOLN: 250.0000 mL | INTRAVENOUS | Status: DC | PRN

## 2017-03-30 MED ORDER — PNEUMOCOCCAL VAC POLYVALENT 25 MCG/0.5ML IJ INJ
0.5000 mL | INJECTION | INTRAMUSCULAR | Status: AC
  Administered 2017-03-31: 0.5 mL via INTRAMUSCULAR
  Filled 2017-03-30: qty 0.5

## 2017-03-30 MED ORDER — TRAMADOL HCL 50 MG PO TABS: 50.0000 mg | ORAL_TABLET | Freq: Four times a day (QID) | ORAL | Status: DC | PRN

## 2017-03-30 MED ORDER — ACETAMINOPHEN 650 MG RE SUPP: 650.0000 mg | Freq: Four times a day (QID) | RECTAL | Status: DC | PRN

## 2017-03-30 MED ORDER — INFLUENZA VAC SPLIT HIGH-DOSE 0.5 ML IM SUSY
0.5000 mL | PREFILLED_SYRINGE | INTRAMUSCULAR | Status: AC
  Administered 2017-03-31: 0.5 mL via INTRAMUSCULAR
  Filled 2017-03-30: qty 0.5

## 2017-03-30 MED ORDER — ONDANSETRON HCL 4 MG PO TABS: 4.0000 mg | ORAL_TABLET | Freq: Four times a day (QID) | ORAL | Status: DC | PRN

## 2017-03-30 MED ORDER — PANTOPRAZOLE SODIUM 40 MG PO TBEC
40.0000 mg | DELAYED_RELEASE_TABLET | Freq: Every day | ORAL | Status: DC
  Administered 2017-03-31 – 2017-04-06 (×7): 40 mg via ORAL
  Filled 2017-03-30 (×7): qty 1

## 2017-03-30 MED ORDER — IPRATROPIUM-ALBUTEROL 0.5-2.5 (3) MG/3ML IN SOLN
3.0000 mL | Freq: Four times a day (QID) | RESPIRATORY_TRACT | Status: DC | PRN
  Administered 2017-03-31 – 2017-04-05 (×5): 3 mL via RESPIRATORY_TRACT
  Filled 2017-03-30 (×6): qty 3

## 2017-03-30 MED ORDER — SENNOSIDES-DOCUSATE SODIUM 8.6-50 MG PO TABS: 1.0000 | ORAL_TABLET | Freq: Every evening | ORAL | Status: DC | PRN

## 2017-03-30 MED ORDER — TRAZODONE HCL 50 MG PO TABS
50.0000 mg | ORAL_TABLET | Freq: Every day | ORAL | Status: DC
  Administered 2017-03-31 – 2017-04-05 (×7): 50 mg via ORAL
  Filled 2017-03-30 (×7): qty 1

## 2017-03-30 MED ORDER — ASPIRIN 81 MG PO CHEW
81.0000 mg | CHEWABLE_TABLET | Freq: Every day | ORAL | Status: DC
  Administered 2017-03-31 – 2017-04-06 (×6): 81 mg via ORAL
  Filled 2017-03-30 (×7): qty 1

## 2017-03-30 MED ORDER — IPRATROPIUM-ALBUTEROL 0.5-2.5 (3) MG/3ML IN SOLN
3.0000 mL | Freq: Once | RESPIRATORY_TRACT | Status: AC
  Administered 2017-03-30: 3 mL via RESPIRATORY_TRACT
  Filled 2017-03-30: qty 3

## 2017-03-30 NOTE — ED Notes (Signed)
Patient transported to 4E via stretcher by monitor tech Franki CabotAmanda W.

## 2017-03-30 NOTE — Telephone Encounter (Signed)
Received records from [facility] for appointment on 03/31/17 with Dr Herbie BaltimoreHarding.  Records put with Dr Elissa HeftyHarding's schedule for 03/31/17. lp

## 2017-03-30 NOTE — ED Notes (Signed)
John [phone] accepting RN. Report scheduled 1745

## 2017-03-30 NOTE — ED Notes (Signed)
[facility] sent to [facility] for SOB desat's easily maintain O2 at 2L N/C to keep sat > 92%

## 2017-03-30 NOTE — Telephone Encounter (Signed)
Collene SchlichterKenecia called from [facility] asking if we had received a medicaid form and a letter of medical necessity for the patient. Cb # [phone]

## 2017-03-30 NOTE — ED Notes (Signed)
resp contacted for Tx pt c/o SOB lungs status unchanged since previous assessment.

## 2017-03-30 NOTE — ED Provider Notes (Signed)
[facility]-EMERGENCY DEPT Provider Note   CSN: [id number] Arrival date & time: 03/30/17  1416     History   Chief Complaint Chief Complaint  Patient presents with  . Shortness of Breath    HPI Caleb Hays is a 76 y.o. male.  Pt presents to the ED today with SOB.  He was admitted to the hospital from 8/30-9/4 for a loculated pleural effusion.  The pt had 2L of fluid drained by IR.  The pt said he felt good after the fluid was drained.  He started getting more sob for the past few days.  The pt said he had a CXR at [facility] which showed re accumulation of the fluid.  He continues to have sob and cough.        Past Medical History:  Diagnosis Date  . Adult failure to thrive    /notes 06/23/2015  . Anemia   . Arthritis    "hands, fingers" (11/10/2016  . COPD (chronic obstructive pulmonary disease) (HCC)    LONG TIME SMOKER  . Critical lower limb ischemia   . 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 Depression    "periods of depression" (06/23/2015)  . Gout   . Hypothyroidism   . PAD (peripheral artery disease) (HCC)   . Physical deconditioning   . Pyelonephritis 06/23/2015  . Status post foot surgery    right fifth toe amputation by Dr. Lajoyce Corners     Patient Active Problem List   Diagnosis Date Noted  . HCAP (healthcare-associated pneumonia) 03/17/2017  . Loculated pleural effusion 03/17/2017  . Chronic pain 03/17/2017  . S/P transmetatarsal amputation of foot, left (HCC) 10/08/2016  . PAD (peripheral artery disease) (HCC) 09/15/2016  . Edema 05/05/2016  . Phantom pain (HCC) 03/08/2016  . Gout 03/08/2016  . Hypothyroidism 03/08/2016  . CKD (chronic kidney disease) stage 3, GFR 30-59 ml/min 03/08/2016  . GERD without esophagitis 03/08/2016  . Constipation 12/29/2015  . Below knee amputation status (HCC) 10/01/2015  . Ankle pain 08/13/2015  . Adult failure to thrive   . PVD (peripheral vascular disease) (HCC)   . Elevated blood  uric acid level 08/10/2015  . 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 Anemia of chronic disease 08/10/2015  . Malnutrition of moderate degree 06/24/2015  . Failure to thrive in adult 06/23/2015  . Weight loss 06/23/2015  . Tobacco abuse 06/23/2015  . COPD (chronic obstructive pulmonary disease) (HCC) 06/23/2015  . Fall   . Critical lower limb ischemia 05/31/2014  . Status post right foot surgery 03/12/2014    Past Surgical History:  Procedure Laterality Date  . ABDOMINAL AORTOGRAM W/LOWER EXTREMITY N/A 09/03/2016   Procedure: Abdominal Aortogram w/Lower Extremity;  Surgeon: Sherren Kerns, MD;  Location: [facility] INVASIVE CV LAB;  Service: Cardiovascular;  Laterality: N/A;  Lt. leg  . ABDOMINAL AORTOGRAM W/LOWER EXTREMITY N/A 11/10/2016   Procedure: Abdominal Aortogram w/Lower Extremity;  Surgeon: Maeola Harman, MD;  Location: [facility] INVASIVE CV LAB;  Service: Cardiovascular;  Laterality: N/A;  . AMPUTATION Right 03/01/2014   Procedure: AMPUTATION RAY;  Surgeon: Nadara Mustard, MD;  Location: [facility] OR;  Service: Orthopedics;  Laterality: Right;  Right Foot 5th Ray Amputation  . AMPUTATION Right 04/26/2014   Procedure: Right Foot 4th Ray Amputation;  Surgeon: Nadara Mustard, MD;  Location: [facility] OR;  Service: Orthopedics;  Laterality: Right;  . AMPUTATION Right 10/01/2015   Procedure: Right Below Knee Amputation;  Surgeon: Aldean Baker  V, MD;  Location: [facility] OR;  Service: Orthopedics;  Laterality: Right;  . AMPUTATION Left 10/08/2016   Procedure: Left Transmetatarsal Amputation;  Surgeon: Nadara Mustard, MD;  Location: [facility] OR;  Service: Orthopedics;  Laterality: Left;  . AMPUTATION Left 07/23/2016   Procedure: Left 2nd Toe Amputation;  Surgeon: Nadara Mustard, MD;  Location: [facility] OR;  Service: Orthopedics;  Laterality: Left;  . ANKLE ARTHROSCOPY Right 08/13/2015   Procedure: ANKLE ARTHROSCOPY;  Surgeon: Nadara Mustard, MD;  Location: [facility] OR;  Service: Orthopedics;  Laterality: Right;  . CATARACT EXTRACTION W/  INTRAOCULAR LENS  IMPLANT, BILATERAL Bilateral   . INGUINAL HERNIA REPAIR Left   . IR THORACENTESIS ASP PLEURAL SPACE W/IMG GUIDE  03/18/2017  . KNEE CARTILAGE SURGERY Left 1960's   football injury  . KNEE LIGAMENT RECONSTRUCTION Left 1960's  . LOWER EXTREMITY ANGIOGRAM N/A 05/27/2014   Procedure: LOWER EXTREMITY ANGIOGRAM;  Surgeon: Runell Gess, MD;  Location: [facility] CATH LAB;  Service: Cardiovascular;  Laterality: N/A;  . LOWER EXTREMITY ANGIOGRAPHY Left 09/15/2016   Procedure: Lower Extremity Angiography;  Surgeon: Maeola Harman, MD;  Location: [facility] INVASIVE CV LAB;  Service: Cardiovascular;  Laterality: Left;  . PERIPHERAL VASCULAR ATHERECTOMY  11/10/2016   Procedure: Peripheral Vascular Atherectomy;  Surgeon: Maeola Harman, MD;  Location: [facility] INVASIVE CV LAB;  Service: Cardiovascular;;  . PERIPHERAL VASCULAR INTERVENTION Left 09/15/2016   Procedure: Peripheral Vascular Intervention;  Surgeon: Maeola Harman, MD;  Location: [facility] INVASIVE CV LAB;  Service: Cardiovascular;  Laterality: Left;  SFA/popiteal  . PERIPHERAL VASCULAR INTERVENTION  11/10/2016   Hattie Perch 11/10/2016  . PERIPHERAL VASCULAR INTERVENTION  11/10/2016   Procedure: Peripheral Vascular Intervention;  Surgeon: Maeola Harman, MD;  Location: [facility] INVASIVE CV LAB;  Service: Cardiovascular;;  . REFRACTIVE SURGERY Bilateral   . STUMP REVISION Right 12/05/2015   Procedure: Revision Right Below Knee Amputation;  Surgeon: Nadara Mustard, MD;  Location: [facility] OR;  Service: Orthopedics;  Laterality: Right;  . STUMP REVISION Left 11/19/2016   Procedure: Revision Left Transmetatarsal Amputation;  Surgeon: Nadara Mustard, MD;  Location: [facility] OR;  Service: Orthopedics;  Laterality: Left;       Home Medications    Prior to Admission medications   Medication Sig Start Date End Date Taking? Authorizing Provider  albuterol (PROVENTIL HFA;VENTOLIN HFA) 108 (90 BASE) MCG/ACT inhaler Inhale 2 puffs into the lungs 2  (two) times daily. 0900 & 1700   Yes [provider]  allopurinol (ZYLOPRIM) 100 MG tablet Take 100 mg by mouth daily.    Yes [provider]  aspirin 81 MG chewable tablet Chew 81 mg by mouth daily.   Yes [provider]  cholecalciferol (VITAMIN D) 1000 units tablet Take 1,000 Units by mouth daily. (0900)   Yes [provider]  clopidogrel (PLAVIX) 75 MG tablet Take 1 tablet (75 mg total) by mouth daily with breakfast. 09/16/16  Yes Maris Berger A, PA-C  gabapentin (NEURONTIN) 100 MG capsule Take 200 mg by mouth at bedtime.    Yes [provider]  levofloxacin (LEVAQUIN) 500 MG tablet Take 500 mg by mouth daily.   Yes [provider]  levothyroxine (SYNTHROID, LEVOTHROID) 25 MCG tablet Take 12.5 mcg by mouth daily at 6 (six) AM.    Yes [provider]  meloxicam (MOBIC) 7.5 MG tablet Take 7.5 mg by mouth daily.    Yes [provider]  Multiple Vitamins-Minerals (MULTIVITAMIN WITH MINERALS) tablet Take 1 tablet by mouth daily. (  0900)   Yes [provider]  omeprazole (PRILOSEC) 20 MG capsule Take 20 mg by mouth daily at 6 (six) AM.    Yes [provider]  saccharomyces boulardii (FLORASTOR) 250 MG capsule Take 250 mg by mouth 2 (two) times daily.   Yes [provider]  traMADol (ULTRAM) 50 MG tablet Take 50 mg by mouth daily.    Yes [provider]  traZODone (DESYREL) 50 MG tablet Take 50 mg by mouth at bedtime.    Yes [provider]  guaiFENesin (MUCINEX) 600 MG 12 hr tablet Take 1,200 mg by mouth 2 (two) times daily as needed for cough.    [provider]  ipratropium-albuterol (DUONEB) 0.5-2.5 (3) MG/3ML SOLN Take 3 mLs by nebulization 4 (four) times daily as needed (for shortness of breath).    [provider]  loperamide (IMODIUM A-D) 2 MG tablet Take 2-4 mg by mouth every 4 (four) hours as needed for diarrhea or loose stools (Do not give if <3 stools or DX.  w/c. diff or norovirus. Do not exceed 16 mg/24hrs.).     [provider]  methocarbamol (ROBAXIN) 500 MG tablet Take 500 mg by mouth every 6 (six) hours as needed for muscle spasms.     [provider]  oxyCODONE (OXY IR/ROXICODONE) 5 MG immediate release tablet Take 1-2 tablets (5-10 mg total) by mouth every 4 (four) hours as needed (  for moderate pain,  for severe pain). 11/11/16   Lars Mage, PA-C    Family History Family History  Problem Relation Age of Onset  . Heart failure Father   . Heart attack Father   . Heart failure Brother     Social History Social History  Substance Use Topics  . Smoking status: Former Smoker    Packs/day: 1.00    Years: 57.00    Types: Cigarettes    Quit date: 03/19/2016  . Smokeless tobacco: Never Used  . Alcohol use No     Comment: 11/10/2016 I was drinking alot of beer; quit in 2015"      Allergies   Patient has no known allergies.   Review of Systems Review of Systems  Respiratory: Positive for cough, shortness of breath and wheezing.   All other systems reviewed and are negative.    Physical Exam Updated Vital Signs BP 137/82   Pulse 68   Temp (!) 97.3 F (36.3 C) (Oral)   Resp 15   Ht  (1.93 m)   Wt 76.7 kg (169 lb)   SpO2 96%   BMI 20.57 kg/m   Physical Exam  Constitutional: He is oriented to person, place, and time. He appears well-developed and well-nourished.  HENT:  Head: Normocephalic and atraumatic.  Right Ear: External ear normal.  Left Ear: External ear normal.  Nose: Nose normal.  Mouth/Throat: Oropharynx is clear and moist.  Eyes: Pupils are equal, round, and reactive to light. Conjunctivae and EOM are normal.  Neck: Normal range of motion. Neck supple.  Cardiovascular: Normal rate, regular rhythm, normal heart sounds and intact distal pulses.   Pulmonary/Chest: Effort normal. He has wheezes. He has rales.  Abdominal: Soft. Bowel sounds are normal.  Musculoskeletal: Normal  range of motion.  Right bka Left transmetatarsal amputation  Neurological: He is alert and oriented to person, place, and time.  Skin: Skin is warm.  Psychiatric: He has a normal mood and affect. His behavior is normal. Judgment and thought content normal.  Nursing note and vitals  reviewed.    ED Treatments / Results  Labs (all labs ordered are listed, but only abnormal results are displayed) Labs Reviewed  COMPREHENSIVE METABOLIC PANEL - Abnormal; Notable for the following:       Result Value   Calcium 8.6 (*)    Total Protein 5.9 (*)    Albumin 3.2 (*)    ALT 10 (*)    GFR calc non Af Amer 59 (*)    All other components within normal limits  CBC WITH DIFFERENTIAL/PLATELET - Abnormal; Notable for the following:    Hemoglobin 9.5 (*)    HCT 30.5 (*)    MCV 69.0 (*)    MCH 21.5 (*)    RDW 18.2 (*)    nRBC 2 (*)    All other components within normal limits  TROPONIN I - Abnormal; Notable for the following:    Troponin I 0.35 (*)    All other components within normal limits  BRAIN NATRIURETIC PEPTIDE    EKG  EKG Interpretation None     EKG not coming through on MUSE, but not change from prior.  Radiology Dg Chest 2 View  Result Date: 03/30/2017 CLINICAL DATA:  Shortness of breath. EXAM: CHEST  2 VIEW COMPARISON:  March 19, 2017. FINDINGS: Stable cardiomegaly. Normal pulmonary vascularity. New moderate loculated right pleural effusion. Trace left pleural effusion. Bibasilar atelectasis. No pneumothorax. No acute osseous abnormality. IMPRESSION: Interval reaccumulation of moderate loculated right pleural effusion. Electronically Signed   By: Obie DredgeWilliam T Derry M.D.   On: 03/30/2017 15:54    Procedures Procedures (including critical care time)  Medications Ordered in ED Medications  ipratropium-albuterol (DUONEB) 0.5-2.5 (3) MG/3ML nebulizer solution 3 mL (3 mLs Nebulization Given 03/30/17 1636)  aspirin chewable tablet 324 mg (324 mg Oral Given 03/30/17 1801)      Initial Impression / Assessment and Plan / ED Course  I have reviewed the triage vital signs and the nursing notes.  Pertinent labs & imaging results that were available during my care of the patient were reviewed by me and considered in my medical decision making (see chart for details).    Pt d/w Dr. Antionette Charpyd for admission.  Final Clinical Impressions(s) / ED Diagnoses   Final diagnoses:  COPD exacerbation (HCC)  Recurrent right pleural effusion  Elevated troponin    New Prescriptions New Prescriptions   No medications on file     Jacalyn LefevreHaviland, Orlyn Odonoghue, MD 03/30/17 1821

## 2017-03-30 NOTE — ED Notes (Signed)
ED TO INPATIENT HANDOFF REPORT  Name/Age/Gender Caleb Hays 76 y.o. male  Code Status    Code Status Orders        Start     Ordered   03/30/17 1848  Full code  Continuous     03/30/17 1850    Code Status History    Date Active Date Inactive Code Status Order ID Comments User Context   03/17/2017  8:30 PM 03/22/2017  8:27 PM Full Code [id number]  Vianne Bulls, MD ED   11/10/2016  4:03 PM 11/11/2016  5:54 PM Full Code [id number]  Waynetta Sandy, MD Inpatient   10/08/2016  5:39 PM 10/12/2016 12:36 PM Full Code [id number]  Newt Minion, MD Inpatient   09/15/2016  5:28 PM 09/16/2016  7:27 PM Full Code [id number]  Waynetta Sandy, MD Inpatient   09/03/2016 11:15 AM 09/03/2016  2:28 PM Full Code [id number]  Elam Dutch, MD Inpatient   07/22/2016  7:04 PM 07/28/2016  1:59 PM Full Code [id number]  Velvet Bathe, MD Inpatient   08/13/2015  8:08 PM 08/16/2015  8:31 PM Full Code [id number]  Newt Minion, MD Inpatient   08/13/2015  3:28 PM 08/13/2015  8:08 PM Full Code [id number]  Rondel Jumbo, PA-C ED   08/10/2015  3:11 AM 08/11/2015  9:01 PM Full Code [id number]  Norval Morton, MD Inpatient   06/23/2015  8:17 PM 06/26/2015  7:17 PM Full Code [id number]  Karen Kitchens Inpatient   05/27/2014  2:31 PM 05/27/2014  9:20 PM Full Code [id number]  Lorretta Harp, MD Inpatient      Home/SNF/Other Nursing Home  Chief Complaint SOB   Level of Care/Admitting Diagnosis ED Disposition    ED Disposition Condition [place]: [facility] [[id number]]  Level of Care: Telemetry [5]  Admit to tele based on following criteria: Monitor for Ischemic changes  Diagnosis: Loculated pleural effusion [940768]  Admitting Physician: Vianne Bulls [[id number]]  Attending Physician: Vianne Bulls [[id number]]  Estimated length of stay: past midnight tomorrow  Certification:: I certify this patient will need inpatient services for at least 2 midnights  PT Class (Do Not Modify): Inpatient [101]  PT Acc Code (Do Not Modify): Private [1]       Medical History Past Medical History:  Diagnosis Date  . Adult failure to thrive    /notes 06/23/2015  . Anemia   . Arthritis    "hands, fingers" (11/10/2016  . COPD (chronic obstructive pulmonary disease) (HCC)    LONG TIME SMOKER  . Critical lower limb ischemia   . 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 Depression    "periods of depression" (06/23/2015)  . Gout   . Hypothyroidism   . PAD (peripheral artery disease) ([place])   . Physical deconditioning   . Pyelonephritis 06/23/2015  . Status post foot surgery    right fifth toe amputation by Dr. Sharol Given     Allergies No Known Allergies  IV Location/Drains/Wounds Patient Lines/Drains/Airways Status   Active Line/Drains/Airways    Name:   Placement date:   Placement time:   Site:   Days:   Peripheral IV 03/30/17 Left Forearm  03/30/17    1633    Forearm    less than 1   Negative Pressure Wound Therapy Foot Left;Distal  11/19/16    1545        131   Incision (  Closed) 11/19/16 Foot Left  11/19/16    1613      131   Incision (Closed) 11/19/16 Foot  11/19/16    1617      131   Wound / Incision (Open or Dehisced) 11/10/16 Incision - Dehisced Foot Left SURGICAL SITE POST TOES AMPUTATATION  11/10/16    1610    Foot    140          Labs/Imaging Results for orders placed or performed during the hospital encounter of 03/30/17 (from the past 48 hour(s))  Comprehensive metabolic panel     Status: Abnormal   Collection Time: 03/30/17  4:22 PM  Result Value Ref Range   Sodium 139 135 - 145 mmol/L   Potassium 4.5 3.5 - 5.1 mmol/L   Chloride 104 101 - 111 mmol/L   CO2 27 22 - 32 mmol/L   Glucose, Bld 81 65 - 99 mg/dL   BUN 20 6 - 20 mg/dL   Creatinine, Ser 1.17 0.61 - 1.24 mg/dL   Calcium 8.6 (L) 8.9 - 10.3 mg/dL   Total Protein 5.9 (L) 6.5 - 8.1 g/dL   Albumin 3.2 (L) 3.5 - 5.0 g/dL   AST 17 15 - 41 U/L   ALT  10 (L) 17 - 63 U/L   Alkaline Phosphatase 70 38 - 126 U/L   Total Bilirubin 0.7 0.3 - 1.2 mg/dL   GFR calc non Af Amer 59 (L) >60 mL/min   GFR calc Af Amer >60 >60 mL/min    Comment: (NOTE) The eGFR has been calculated using the CKD EPI equation. This calculation has not been validated in all clinical situations. eGFR's persistently <60 mL/min signify possible Chronic Kidney Disease.    Anion gap 8 5 - 15  CBC WITH DIFFERENTIAL     Status: Abnormal   Collection Time: 03/30/17  4:22 PM  Result Value Ref Range   WBC 7.5 4.0 - 10.5 K/uL    Comment: WHITE COUNT CONFIRMED ON SMEAR   RBC 4.42 4.22 - 5.81 MIL/uL   Hemoglobin 9.5 (L) 13.0 - 17.0 g/dL   HCT 30.5 (L) 39.0 - 52.0 %   MCV 69.0 (L) 78.0 - 100.0 fL   MCH 21.5 (L) 26.0 - 34.0 pg   MCHC 31.1 30.0 - 36.0 g/dL   RDW 18.2 (H) 11.5 - 15.5 %   Platelets 396 150 - 400 K/uL    Comment: SPECIMEN CHECKED FOR CLOTS PLATELET COUNT CONFIRMED BY SMEAR    Neutrophils Relative % 49 %   Lymphocytes Relative 35 %   Monocytes Relative 12 %   Eosinophils Relative 1 %   Basophils Relative 0 %   Band Neutrophils 0 %   Metamyelocytes Relative 3 %   Myelocytes 0 %   Promyelocytes Absolute 0 %   Blasts 0 %   nRBC 2 (H) 0 /100 WBC   Other 0 %   Neutro Abs 3.9 1.7 - 7.7 K/uL   Lymphs Abs 2.6 0.7 - 4.0 K/uL   Monocytes Absolute 0.9 0.1 - 1.0 K/uL   Eosinophils Absolute 0.1 0.0 - 0.7 K/uL   Basophils Absolute 0.0 0.0 - 0.1 K/uL   RBC Morphology TEARDROP CELLS     Comment: ELLIPTOCYTES RARE NRBCs   Troponin I     Status: Abnormal   Collection Time: 03/30/17  4:22 PM  Result Value Ref Range   Troponin I 0.35 (HH) <0.03 ng/mL    Comment: CRITICAL RESULT CALLED TO, READ BACK BY AND VERIFIED  WITH: GILLESPIE,C @ 1726 ON [id number] BY POTEAT,S    Dg Chest 2 View  Result Date: 03/30/2017 CLINICAL DATA:  Shortness of breath. EXAM: CHEST  2 VIEW COMPARISON:  March 19, 2017. FINDINGS: Stable cardiomegaly. Normal pulmonary vascularity. New  moderate loculated right pleural effusion. Trace left pleural effusion. Bibasilar atelectasis. No pneumothorax. No acute osseous abnormality. IMPRESSION: Interval reaccumulation of moderate loculated right pleural effusion. Electronically Signed   By: Titus Dubin M.D.   On: 03/30/2017 15:54    Pending Labs Unresulted Labs    Start     Ordered   04/06/17 0500  Creatinine, serum  (enoxaparin (LOVENOX)    CrCl >/= 30 ml/min)  Weekly,   R    Comments:  while on enoxaparin therapy    03/30/17 1850   03/31/17 0500  CBC  Tomorrow morning,   R     03/30/17 1850   03/31/17 1448  Basic metabolic panel  Tomorrow morning,   R     03/30/17 1850   03/30/17 2200  Troponin I (q 6hr x 3)  Now then every 6 hours,   R     03/30/17 1851   03/30/17 1849  Culture, sputum-assessment  Once,   R     03/30/17 1850   03/30/17 1622  Brain natriuretic peptide  STAT,   STAT     03/30/17 1622      Vitals/Pain Today's Vitals   03/30/17 1434 03/30/17 1630 03/30/17 1649 03/30/17 1850  BP:  137/82 137/82 132/67  Pulse:  67 68 66  Resp:  _0 Temp:      TempSrc:      SpO2:  97% 96% 100%  Weight: 169 lb (76.7 kg)     Height: _1  (1.93 m)     PainSc:        Isolation Precautions No active isolations  Medications Medications  guaiFENesin (MUCINEX) 12 hr tablet 1,200 mg (not administered)  ipratropium-albuterol (DUONEB) 0.5-2.5 (3) MG/3ML nebulizer solution 3 mL (not administered)  traMADol (ULTRAM) tablet 50 mg (not administered)  oxyCODONE (Oxy IR/ROXICODONE) immediate release tablet 5-10 mg (not administered)  saccharomyces boulardii (FLORASTOR) capsule 250 mg (not administered)  pantoprazole (PROTONIX) EC tablet 40 mg (not administered)  clopidogrel (PLAVIX) tablet 75 mg (not administered)  aspirin chewable tablet 81 mg (not administered)  gabapentin (NEURONTIN) capsule 200 mg (not administered)  allopurinol (ZYLOPRIM) tablet 100 mg (not administered)  levothyroxine (SYNTHROID, LEVOTHROID)  tablet 12.5 mcg (not administered)  cholecalciferol (VITAMIN D) tablet 1,000 Units (not administered)  multivitamin with minerals tablet 1 tablet (not administered)  methocarbamol (ROBAXIN) tablet 500 mg (not administered)  traZODone (DESYREL) tablet 50 mg (not administered)  meloxicam (MOBIC) tablet 7.5 mg (not administered)  enoxaparin (LOVENOX) injection 40 mg (not administered)  sodium chloride flush (NS) 0.9 % injection 3 mL (not administered)  sodium chloride flush (NS) 0.9 % injection 3 mL (not administered)  sodium chloride flush (NS) 0.9 % injection 3 mL (not administered)  0.9 %  sodium chloride infusion (not administered)  acetaminophen (TYLENOL) tablet 650 mg (not administered)    Or  acetaminophen (TYLENOL) suppository 650 mg (not administered)  senna-docusate (Senokot-S) tablet 1 tablet (not administered)  ondansetron (ZOFRAN) tablet 4 mg (not administered)    Or  ondansetron (ZOFRAN) injection 4 mg (not administered)  ipratropium-albuterol (DUONEB) 0.5-2.5 (3) MG/3ML nebulizer solution 3 mL (3 mLs Nebulization Given 03/30/17 1636)  aspirin chewable tablet 324 mg (324 mg Oral Given 03/30/17 1801)  Mobility non-ambulatory

## 2017-03-30 NOTE — ED Triage Notes (Signed)
[facility] on 03/28/17-pleural effusion.

## 2017-03-30 NOTE — H&P (Signed)
History and Physical    KMARION RAWL AYT:[id number] DOB: 28-Apr-1941 DOA: 03/30/2017  PCP: Patient, No Pcp Per   Patient coming from: [facility]   Chief Complaint: SOB  HPI: Caleb Hays is a 76 y.o. 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 Patient was admitted to the hospital 2 weeks ago with hypoxia found to have a right-sided pleural effusion, underwent thoracentesis with 2 L removed was treated with antibiotics and completed a course of Levaquin after discharge, and felt as though he was back to his baseline briefly before the insidious development of shortness of breath over the past several days. Patient denies chest pain, denies any significant cough, and denies fevers or chills. Reports that he was slightly dyspneic a few days ago, but this has progressively worsened to the point where he has trouble catching his breath while at rest today. X-ray was performed at his nursing facility and concerning for reaccumulation of the pleural effusion on the right side.  ED Course: Upon arrival to the ED, patient is found to be afebrile, saturating adequately on 4 L/m supplemental oxygen, and with vitals otherwise stable. EKG features a sinus rhythm with low-voltage QRS and chest x-ray is notable for interval reaccumulation of moderate loculated right pleural effusion. Chemistry panel was unremarkable and CBC is notable for stable microcytic anemia with hemoglobin of 9.5 and MCV of 69.0. Troponin is elevated to 0.35. Patient was treated with 324 mg of aspirin and DuoNeb in the ED. He remained hemodynamically stable, continues to deny any chest pain, is dyspneic but not in acute distress, and will be admitted to the telemetry unit for ongoing evaluation and  management of recurrent dyspnea with hypoxic respiratory failure secondary to right-sided pleural effusion.  Review of Systems:  All other systems reviewed and apart from HPI, are negative.  Past Medical History:  Diagnosis Date  . Adult failure to thrive    /notes 06/23/2015  . Anemia   . Arthritis    "hands, fingers" (11/10/2016  . COPD (chronic obstructive pulmonary disease) (HCC)    LONG TIME SMOKER  . Critical lower limb ischemia   . 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 Depression    "periods of depression" (06/23/2015)  . Gout   . Hypothyroidism   . PAD (peripheral artery disease) (HCC)   . Physical deconditioning   . Pyelonephritis 06/23/2015  . Status post foot surgery    right fifth toe amputation by Dr. Lajoyce Corners     Past Surgical History:  Procedure Laterality Date  . ABDOMINAL AORTOGRAM W/LOWER EXTREMITY N/A 09/03/2016   Procedure: Abdominal Aortogram w/Lower Extremity;  Surgeon: Sherren Kerns, MD;  Location: [facility] INVASIVE CV LAB;  Service: Cardiovascular;  Laterality: N/A;  Lt. leg  . ABDOMINAL AORTOGRAM W/LOWER EXTREMITY N/A 11/10/2016   Procedure: Abdominal Aortogram w/Lower Extremity;  Surgeon: Maeola Harman, MD;  Location: [facility] INVASIVE CV LAB;  Service: Cardiovascular;  Laterality: N/A;  . AMPUTATION Right 03/01/2014   Procedure: AMPUTATION RAY;  Surgeon: Nadara Mustard, MD;  Location: [facility] OR;  Service: Orthopedics;  Laterality: Right;  Right Foot 5th Ray Amputation  . AMPUTATION Right 04/26/2014   Procedure: Right Foot 4th Ray Amputation;  Surgeon: Nadara Mustard, MD;  Location: [facility] OR;  Service: Orthopedics;  Laterality: Right;  . AMPUTATION Right 10/01/2015   Procedure: Right Below Knee Amputation;  Surgeon: Nadara Mustard, MD;  Location: [facility] OR;  Service: Orthopedics;  Laterality: Right;  . AMPUTATION Left 10/08/2016   Procedure: Left Transmetatarsal Amputation;  Surgeon: Nadara Mustard, MD;  Location: [facility] OR;  Service: Orthopedics;   Laterality: Left;  . AMPUTATION Left 07/23/2016   Procedure: Left 2nd Toe Amputation;  Surgeon: Nadara Mustard, MD;  Location: [facility] OR;  Service: Orthopedics;  Laterality: Left;  . ANKLE ARTHROSCOPY Right 08/13/2015   Procedure: ANKLE ARTHROSCOPY;  Surgeon: Nadara Mustard, MD;  Location: [facility] OR;  Service: Orthopedics;  Laterality: Right;  . CATARACT EXTRACTION W/ INTRAOCULAR LENS  IMPLANT, BILATERAL Bilateral   . INGUINAL HERNIA REPAIR Left   . IR THORACENTESIS ASP PLEURAL SPACE W/IMG GUIDE  03/18/2017  . KNEE CARTILAGE SURGERY Left 1960's   football injury  . KNEE LIGAMENT RECONSTRUCTION Left 1960's  . LOWER EXTREMITY ANGIOGRAM N/A 05/27/2014   Procedure: LOWER EXTREMITY ANGIOGRAM;  Surgeon: Runell Gess, MD;  Location: [facility] CATH LAB;  Service: Cardiovascular;  Laterality: N/A;  . LOWER EXTREMITY ANGIOGRAPHY Left 09/15/2016   Procedure: Lower Extremity Angiography;  Surgeon: Maeola Harman, MD;  Location: [facility] INVASIVE CV LAB;  Service: Cardiovascular;  Laterality: Left;  . PERIPHERAL VASCULAR ATHERECTOMY  11/10/2016   Procedure: Peripheral Vascular Atherectomy;  Surgeon: Maeola Harman, MD;  Location: [facility] INVASIVE CV LAB;  Service: Cardiovascular;;  . PERIPHERAL VASCULAR INTERVENTION Left 09/15/2016   Procedure: Peripheral Vascular Intervention;  Surgeon: Maeola Harman, MD;  Location: [facility] INVASIVE CV LAB;  Service: Cardiovascular;  Laterality: Left;  SFA/popiteal  . PERIPHERAL VASCULAR INTERVENTION  11/10/2016   Hattie Perch 11/10/2016  . PERIPHERAL VASCULAR INTERVENTION  11/10/2016   Procedure: Peripheral Vascular Intervention;  Surgeon: Maeola Harman, MD;  Location: [facility] INVASIVE CV LAB;  Service: Cardiovascular;;  . REFRACTIVE SURGERY Bilateral   . STUMP REVISION Right 12/05/2015   Procedure: Revision Right Below Knee Amputation;  Surgeon: Nadara Mustard, MD;  Location: [facility] OR;  Service: Orthopedics;  Laterality: Right;  . STUMP REVISION Left 11/19/2016   Procedure:  Revision Left Transmetatarsal Amputation;  Surgeon: Nadara Mustard, MD;  Location: [facility] OR;  Service: Orthopedics;  Laterality: Left;     reports that he quit smoking about a year ago. His smoking use included Cigarettes. He has a 57.00 pack-year smoking history. He has never used smokeless tobacco. He reports that he does not drink alcohol or use drugs.  No Known Allergies  Family History  Problem Relation Age of Onset  . Heart failure Father   . Heart attack Father   . Heart failure Brother      Prior to Admission medications   Medication Sig Start Date End Date Taking? Authorizing Provider  albuterol (PROVENTIL HFA;VENTOLIN HFA) 108 (90 BASE) MCG/ACT inhaler Inhale 2 puffs into the lungs 2 (two) times daily. 0900 & 1700   Yes [provider]  allopurinol (ZYLOPRIM) 100 MG tablet Take 100 mg by mouth daily.    Yes [provider]  aspirin 81 MG chewable tablet Chew 81 mg by mouth daily.   Yes [provider]  cholecalciferol (VITAMIN D) 1000 units tablet Take 1,000 Units by mouth daily. (0900)   Yes [provider]  clopidogrel (PLAVIX) 75 MG tablet Take 1 tablet (75 mg total) by mouth daily with breakfast. 09/16/16  Yes Trinh, Kimberly A, PA-C  gabapentin (NEURONTIN) 100 MG capsule Take 200 mg by  mouth at bedtime.    Yes [provider]  levothyroxine (SYNTHROID, LEVOTHROID) 25 MCG tablet Take 12.5 mcg by mouth daily at 6 (six) AM.    Yes [provider]  meloxicam (MOBIC) 7.5 MG tablet Take 7.5 mg by mouth daily.    Yes [provider]  Multiple Vitamins-Minerals (MULTIVITAMIN WITH MINERALS) tablet Take 1 tablet by mouth daily. (0900)   Yes [provider]  omeprazole (PRILOSEC) 20 MG capsule Take 20 mg by mouth daily at 6 (six) AM.    Yes [provider]  saccharomyces boulardii (FLORASTOR) 250 MG capsule Take 250 mg by mouth 2 (two) times daily.   Yes [provider]  traMADol (ULTRAM) 50 MG tablet  Take 50 mg by mouth daily.    Yes [provider]  traZODone (DESYREL) 50 MG tablet Take 50 mg by mouth at bedtime.    Yes [provider]  guaiFENesin (MUCINEX) 600 MG 12 hr tablet Take 1,200 mg by mouth 2 (two) times daily as needed for cough.    [provider]  ipratropium-albuterol (DUONEB) 0.5-2.5 (3) MG/3ML SOLN Take 3 mLs by nebulization 4 (four) times daily as needed (for shortness of breath).    [provider]  loperamide (IMODIUM A-D) 2 MG tablet Take 2-4 mg by mouth every 4 (four) hours as needed for diarrhea or loose stools (Do not give if <3 stools or DX. w/c. diff or norovirus. Do not exceed 16 mg/24hrs.).     [provider]  methocarbamol (ROBAXIN) 500 MG tablet Take 500 mg by mouth every 6 (six) hours as needed for muscle spasms.     [provider]  oxyCODONE (OXY IR/ROXICODONE) 5 MG immediate release tablet Take 1-2 tablets (5-10 mg total) by mouth every 4 (four) hours as needed (  for moderate pain,  for severe pain). 11/11/16   Lars Mage, PA-C    Physical Exam: Vitals:   03/30/17 1434 03/30/17 1630 03/30/17 1649 03/30/17 1850  BP:  137/82 137/82 132/67  Pulse:  67 68 66  Resp:  Temp:      TempSrc:      SpO2:  97% 96% 100%  Weight: 76.7 kg (169 lb)     Height:  (1.93 m)         Constitutional: Not in acute distress, appears uncomfortable, no pallor, no diaphoresis.  Eyes: PERTLA, lids and conjunctivae normal ENMT: Mucous membranes are moist. Posterior pharynx clear of any exudate or lesions.   Neck: normal, supple, no masses, no thyromegaly Respiratory: markedly diminished in right base, rub at right mid-lung. Dyspneic with speech. No accessory muscle use.  Cardiovascular: S1 & S2 heard, regular rate and rhythm. No significant JVD. Abdomen: No distension, no tenderness, no masses palpated. Bowel sounds active.  Musculoskeletal: no clubbing / cyanosis. Status-post right BKA and left  TMA.    Skin: no significant rashes, lesions, ulcers. Warm, dry, well-perfused. Neurologic: CN 2-12 grossly intact. Sensation intact. Strength 5/5.  Psychiatric: Alert and oriented x 3. Very pleasant, cooperative.     Labs on Admission: I have personally reviewed following labs and imaging studies  CBC:  Recent Labs Lab 03/30/17 1622  WBC 7.5  NEUTROABS 3.9  HGB 9.5*  HCT 30.5*  MCV 69.0*  PLT 396   Basic Metabolic Panel:  Recent Labs Lab 03/30/17 1622  NA 139  K 4.5  CL 104  CO2 27  GLUCOSE 81  BUN 20  CREATININE 1.17  CALCIUM 8.6*   GFR: Estimated Creatinine Clearance: 59.2 mL/min (by C-G formula based on SCr of 1.17 mg/dL). Liver Function Tests:  Recent Labs Lab 03/30/17 1622  AST 17  ALT 10*  ALKPHOS 70  BILITOT 0.7  PROT 5.9*  ALBUMIN 3.2*   No results for input(s): LIPASE, AMYLASE in the last 168 hours. No results for input(s): AMMONIA in the last 168 hours. Coagulation Profile: No results for input(s): INR, PROTIME in the last 168 hours. Cardiac Enzymes:  Recent Labs Lab 03/30/17 1622  TROPONINI 0.35*   BNP (last 3 results) No results for input(s): PROBNP in the last 8760 hours. HbA1C: No results for input(s): HGBA1C in the last 72 hours. CBG: No results for input(s): GLUCAP in the last 168 hours. Lipid Profile: No results for input(s): CHOL, HDL, LDLCALC, TRIG, CHOLHDL, LDLDIRECT in the last 72 hours. Thyroid Function Tests: No results for input(s): TSH, T4TOTAL, FREET4, T3FREE, THYROIDAB in the last 72 hours. Anemia Panel: No results for input(s): VITAMINB12, FOLATE, FERRITIN, TIBC, IRON, RETICCTPCT in the last 72 hours. Urine analysis:    Component Value Date/Time   COLORURINE YELLOW 07/22/2016 1631   APPEARANCEUR CLOUDY (A) 07/22/2016 1631   LABSPEC 1.025 07/22/2016 1631   PHURINE 5.5 07/22/2016 1631   GLUCOSEU NEGATIVE 07/22/2016 1631   HGBUR MODERATE (A) 07/22/2016 1631   BILIRUBINUR NEGATIVE 07/22/2016 1631   KETONESUR  NEGATIVE 07/22/2016 1631   PROTEINUR 30 (A) 07/22/2016 1631   UROBILINOGEN >8.0 (H) 05/14/2012 1805   NITRITE NEGATIVE 07/22/2016 1631   LEUKOCYTESUR LARGE (A) 07/22/2016 1631   Sepsis Labs: (procalcitonin:4,lacticidven:4) )No results found for this or any previous visit (from the past 240 hour(s)).   Radiological Exams on Admission: Dg Chest 2 View  Result Date: 03/30/2017 CLINICAL DATA:  Shortness of breath. EXAM: CHEST  2 VIEW COMPARISON:  March 19, 2017. FINDINGS: Stable cardiomegaly. Normal pulmonary vascularity. New moderate loculated right pleural effusion. Trace left pleural effusion. Bibasilar atelectasis. No pneumothorax. No acute osseous abnormality. IMPRESSION: Interval reaccumulation of moderate loculated right pleural effusion. Electronically Signed   By: Obie Dredge M.D.   On: 03/30/2017 15:54    EKG: Independently reviewed. Sinus rhythm, low-voltage QRS.   Assessment/Plan  1. Pleural effusion with hypoxic respiratory failure  - Pt presents with progressive dyspnea of the past several days, now with SOB at rest  - No fever or leukocytosis, no significant cough  - Found to have reaccumulation of right-sided pleural effusion  - He underwent right-sided thoracentesis 2 wks ago, yieldling 2 liters, and with culture negative   - Plan to continue supportive care with supplemental O2, and will request thoracentesis with cytology   2. COPD  - No cough or wheeze on admission  - Continue DuoNeb and supplemental O2   3. Microcytic anemia  - Hgb is stable at 9.5 with no bleeding evident  - Recent anemia panel consistent with iron-deficiency   4. Elevated troponin - Troponin elevated to 0.35 in ED  - There are no anginal complaints and no acute ischemic findings on EKG  - Suspect this represents a demand-ischemia and suspicion for ACS is low  - Plan to continue cardiac monitoring, trend troponin, and repeat EKG in am   - Continue ASA 81 and Plavix   5.  Hypothyroidism  - Appears stable  - Continue Synthroid     DVT prophylaxis: sq Lovenox Code Status: Full  Family Communication: Discussed with patient Disposition Plan: Admit to telemetry Consults called: None Admission status: Inpatient  Briscoe Deutscherimothy S Dom Haverland, MD [facility] Pager [phone]  If 7PM-7AM, please contact night-coverage [email or web address] Password [email or web address]  03/30/2017, 6:51 PM

## 2017-03-31 ENCOUNTER — Inpatient Hospital Stay (HOSPITAL_COMMUNITY): Payer: Medicare HMO

## 2017-03-31 ENCOUNTER — Ambulatory Visit: Payer: Medicare HMO | Admitting: Cardiology

## 2017-03-31 DIAGNOSIS — I361 Nonrheumatic tricuspid (valve) insufficiency: Secondary | ICD-10-CM

## 2017-03-31 DIAGNOSIS — I340 Nonrheumatic mitral (valve) insufficiency: Secondary | ICD-10-CM

## 2017-03-31 DIAGNOSIS — I350 Nonrheumatic aortic (valve) stenosis: Secondary | ICD-10-CM

## 2017-03-31 LAB — TROPONIN I
TROPONIN I: 0.32 ng/mL — AB (ref <0.03)
TROPONIN I: 0.37 ng/mL — AB (ref <0.03)
Troponin I: 0.29 ng/mL (ref <0.03)

## 2017-03-31 LAB — BASIC METABOLIC PANEL
Anion gap: 8 (ref 5–15)
BUN: 20 mg/dL (ref 6–20)
CALCIUM: 8.4 mg/dL — AB (ref 8.9–10.3)
CHLORIDE: 103 mmol/L (ref 101–111)
CO2: 27 mmol/L (ref 22–32)
CREATININE: 1.08 mg/dL (ref 0.61–1.24)
Glucose, Bld: 87 mg/dL (ref 65–99)
Potassium: 4.3 mmol/L (ref 3.5–5.1)
SODIUM: 138 mmol/L (ref 135–145)

## 2017-03-31 LAB — CBC
HCT: 29.1 % — ABNORMAL LOW (ref 39.0–52.0)
HEMOGLOBIN: 9.2 g/dL — AB (ref 13.0–17.0)
MCH: 21.8 pg — ABNORMAL LOW (ref 26.0–34.0)
MCHC: 31.6 g/dL (ref 30.0–36.0)
MCV: 69 fL — ABNORMAL LOW (ref 78.0–100.0)
PLATELETS: 376 10*3/uL (ref 150–400)
RBC: 4.22 MIL/uL (ref 4.22–5.81)
RDW: 18.3 % — ABNORMAL HIGH (ref 11.5–15.5)
WBC: 6.9 10*3/uL (ref 4.0–10.5)

## 2017-03-31 LAB — ECHOCARDIOGRAM COMPLETE
Height: 76 in
WEIGHTICAEL: 2821.89 [oz_av]

## 2017-03-31 LAB — BODY FLUID CELL COUNT WITH DIFFERENTIAL
Eos, Fluid: 1 %
LYMPHS FL: 30 %
MONOCYTE-MACROPHAGE-SEROUS FLUID: 65 % (ref 50–90)
Neutrophil Count, Fluid: 4 % (ref 0–25)
Total Nucleated Cell Count, Fluid: 231 cu mm (ref 0–1000)

## 2017-03-31 LAB — MRSA PCR SCREENING: MRSA by PCR: NEGATIVE

## 2017-03-31 MED ORDER — DEXTROSE 5 % IV SOLN
1.0000 g | INTRAVENOUS | Status: DC
  Administered 2017-03-31: 1 g via INTRAVENOUS
  Filled 2017-03-31 (×2): qty 10

## 2017-03-31 MED ORDER — FUROSEMIDE 10 MG/ML IJ SOLN
20.0000 mg | Freq: Two times a day (BID) | INTRAMUSCULAR | Status: DC
  Administered 2017-03-31: 20 mg via INTRAVENOUS
  Filled 2017-03-31: qty 2

## 2017-03-31 MED ORDER — POTASSIUM CHLORIDE CRYS ER 20 MEQ PO TBCR
20.0000 meq | EXTENDED_RELEASE_TABLET | Freq: Every day | ORAL | Status: DC
  Administered 2017-03-31 – 2017-04-06 (×7): 20 meq via ORAL
  Filled 2017-03-31 (×7): qty 1

## 2017-03-31 MED ORDER — LIDOCAINE HCL 2 % IJ SOLN
INTRAMUSCULAR | Status: AC
Start: 2017-03-31 — End: 2017-04-01
  Filled 2017-03-31: qty 10

## 2017-03-31 MED ORDER — DEXTROSE 5 % IV SOLN
500.0000 mg | INTRAVENOUS | Status: DC
  Administered 2017-03-31: 500 mg via INTRAVENOUS
  Filled 2017-03-31 (×2): qty 500

## 2017-03-31 MED ORDER — LIDOCAINE HCL 2 % IJ SOLN
INTRAMUSCULAR | Status: AC
  Filled 2017-03-31: qty 10

## 2017-03-31 NOTE — Progress Notes (Signed)
PROGRESS NOTE    Caleb Hays  ZOX:[id number] DOB: Jul 29, 1940 DOA: 03/30/2017 PCP: Patient, No Pcp Per    Brief Narrative: Caleb Hays is a 76 y.o. 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 Patient was admitted to the hospital 2 weeks ago with hypoxia found to have a right-sided pleural effusion, underwent thoracentesis with 2 L removed was treated with antibiotics and completed a course of Levaquin after discharge, and felt as though he was back to his baseline briefly before the insidious development of shortness of breath over the past several days. Patient denies chest pain, denies any significant cough, and denies fevers or chills. Reports that he was slightly dyspneic a few days ago, but this has progressively worsened to the point where he has trouble catching his breath while at rest today. X-ray was performed at his nursing facility and concerning for reaccumulation of the pleural effusion on the right side.  ED Course: Upon arrival to the ED, patient is found to be afebrile, saturating adequately on 4 L/m supplemental oxygen, and with vitals otherwise stable. EKG features a sinus rhythm with low-voltage QRS and chest x-ray is notable for interval reaccumulation of moderate loculated right pleural effusion. Chemistry panel was unremarkable and CBC is notable for stable microcytic anemia with hemoglobin of 9.5 and MCV of 69.0. Troponin is elevated to 0.35. Patient was treated with 324 mg of aspirin and DuoNeb in the ED. He remained hemodynamically stable, continues to deny any chest pain, is dyspneic but not in acute distress, and will be admitted to the telemetry unit for ongoing evaluation and management of recurrent dyspnea with hypoxic respiratory  failure secondary to right-sided pleural effusion.    Assessment & Plan:   Principal Problem:   Recurrent right pleural effusion Active Problems:   COPD (chronic obstructive pulmonary disease) (HCC)   Anemia of chronic disease   Hypothyroidism   Elevated troponin   Chronic respiratory failure with hypoxia (HCC)   Recurrent right loculated pleural effusion; Acute hypoxic Respiratory Failure.  BNP elevated.  Repeat thoracentesis. Repeat cell count, culture and cytology.  Start IV antibiotics.  Check ECHO.  Will consider pulmonary evaluation.  Start lasix.   Mild elevation troponin.  Demand.  check ECHO.  EKG low voltage criteria.  Continue ASA 81 and Plavix   Hypothyroidism;  Continue with synthroid.   COPD  - No cough or wheeze on admission  - Continue DuoNeb and supplemental O2   Microcytic anemia  - Hgb is stable at 9.5 with no bleeding evident  - Recent anemia panel consistent with iron-deficiency    DVT prophylaxis: SCD, lovenox Code Status: Full code.  Family Communication: discussed with patient.  Disposition Plan:  To be determine  Consultants:   none   Procedures: thoracentesis   Antimicrobials:  Ceftriaxone and Azithromycin   Subjective: Report dyspnea, no significant cough. Unknown if he has had weight loss. Denies night sweat.   Objective: Vitals:   03/30/17 2316 03/31/17 0616 03/31/17 0747 03/31/17 1113  BP: 110/74 138/71    Pulse: 71 64    Resp: 18 18    Temp: 97.8 F (36.6 C) 97.8 F (36.6 C)    TempSrc: Oral Oral    SpO2: 96% 97% 97% 97%  Weight: 80 kg (176 lb 5.9 oz)  Height: 6\' 4"  (1.93 m)       Intake/Output Summary (Last 24 hours) at 03/31/17 1138 Last data filed at 03/31/17 0900  Gross per 24 hour  Intake              480 ml  Output              325 ml  Net              155 ml   Filed Weights   03/30/17 1434 03/30/17 2316  Weight: 76.7 kg (169 lb) 80 kg (176 lb 5.9 oz)    Examination:  General exam: Appears  calm and comfortable  Respiratory system: bilateral crackles.  Respiratory effort normal. Cardiovascular system: S1 & S2 heard, RRR. No JVD, murmurs, rubs, gallops or clicks. No pedal edema. Gastrointestinal system: Abdomen is nondistended, soft and nontender. No organomegaly or masses felt. Normal bowel sounds heard. Central nervous system: Alert and oriented. No focal neurological deficits. Extremities: plus 2 edema, right below Knee amputation.  Skin: No rashes, lesions or ulcers Psychiatry: Judgement and insight appear normal. Mood & affect appropriate.     Data Reviewed: I have personally reviewed following labs and imaging studies  CBC:  Recent Labs Lab 03/30/17 1622 03/31/17 0457  WBC 7.5 6.9  NEUTROABS 3.9  --   HGB 9.5* 9.2*  HCT 30.5* 29.1*  MCV 69.0* 69.0*  PLT 396 376   Basic Metabolic Panel:  Recent Labs Lab 03/30/17 1622 03/31/17 0457  NA 139 138  K 4.5 4.3  CL 104 103  CO2 27 27  GLUCOSE 81 87  BUN 20 20  CREATININE 1.17 1.08  CALCIUM 8.6* 8.4*   GFR: Estimated Creatinine Clearance: 66.9 mL/min (by C-G formula based on SCr of 1.08 mg/dL). Liver Function Tests:  Recent Labs Lab 03/30/17 1622  AST 17  ALT 10*  ALKPHOS 70  BILITOT 0.7  PROT 5.9*  ALBUMIN 3.2*   No results for input(s): LIPASE, AMYLASE in the last 168 hours. No results for input(s): AMMONIA in the last 168 hours. Coagulation Profile: No results for input(s): INR, PROTIME in the last 168 hours. Cardiac Enzymes:  Recent Labs Lab 03/30/17 1622 03/30/17 2350 03/31/17 0457 03/31/17 0936  TROPONINI 0.35* 0.37* 0.32* 0.29*   BNP (last 3 results) No results for input(s): PROBNP in the last 8760 hours. HbA1C: No results for input(s): HGBA1C in the last 72 hours. CBG: No results for input(s): GLUCAP in the last 168 hours. Lipid Profile: No results for input(s): CHOL, HDL, LDLCALC, TRIG, CHOLHDL, LDLDIRECT in the last 72 hours. Thyroid Function Tests: No results for  input(s): TSH, T4TOTAL, FREET4, T3FREE, THYROIDAB in the last 72 hours. Anemia Panel: No results for input(s): VITAMINB12, FOLATE, FERRITIN, TIBC, IRON, RETICCTPCT in the last 72 hours. Sepsis Labs: No results for input(s): PROCALCITON, LATICACIDVEN in the last 168 hours.  No results found for this or any previous visit (from the past 240 hour(s)).       Radiology Studies: Dg Chest 2 View  Result Date: 03/30/2017 CLINICAL DATA:  Shortness of breath. EXAM: CHEST  2 VIEW COMPARISON:  March 19, 2017. FINDINGS: Stable cardiomegaly. Normal pulmonary vascularity. New moderate loculated right pleural effusion. Trace left pleural effusion. Bibasilar atelectasis. No pneumothorax. No acute osseous abnormality. IMPRESSION: Interval reaccumulation of moderate loculated right pleural effusion. Electronically Signed   By: Obie DredgeWilliam T Derry M.D.   On: 03/30/2017 15:54        Scheduled Meds: . allopurinol  100 mg  Oral Daily  . aspirin  81 mg Oral Daily  . cholecalciferol  1,000 Units Oral Daily  . clopidogrel  75 mg Oral Q breakfast  . enoxaparin (LOVENOX) injection  40 mg Subcutaneous QHS  . gabapentin  200 mg Oral QHS  . ipratropium-albuterol  3 mL Nebulization QID  . levothyroxine  12.5 mcg Oral Q0600  . meloxicam  7.5 mg Oral Daily  . multivitamin with minerals  1 tablet Oral Daily  . pantoprazole  40 mg Oral Daily  . saccharomyces boulardii  250 mg Oral BID  . sodium chloride flush  3 mL Intravenous Q12H  . sodium chloride flush  3 mL Intravenous Q12H  . traZODone  50 mg Oral QHS   Continuous Infusions: . sodium chloride       LOS: 1 day    Time spent: 35 minutes,     France Noyce, Prentiss Bells, MD [facility] Pager [phone]  If 7PM-7AM, please contact night-coverage [email or web address] Password [facility] 03/31/2017, 11:38 AM

## 2017-03-31 NOTE — Procedures (Signed)
Ultrasound-guided diagnostic and therapeutic right thoracentesis performed yielding 2.2 liters of yellow fluid. No immediate complications. Follow-up chest x-ray pending. A portion of the fluid was sent to the lab for preordered studies.        

## 2017-03-31 NOTE — Progress Notes (Signed)
  Echocardiogram 2D Echocardiogram has been performed.  Arvil ChacoFoster, Amin Fornwalt 03/31/2017, 3:30 PM

## 2017-03-31 NOTE — Care Management Note (Signed)
Case Management Note  Patient Details  Name: Caleb MarinerJerald J Hays MRN: [id number] Date of Birth: 28-Jan-1941  Subjective/Objective: 76 y/o m admitted w/Recurrent R pleural effusion. Hx: COPD, R BKA. Readmit-8/30-9/4-Pleural effusion. From SNF-[facility]. CSW following. PT cons-await recc.                   Action/Plan:d/c plan SNF.   Expected Discharge Date:                  Expected Discharge Plan:  Skilled Nursing Facility  In-House Referral:  Clinical Social Work  Discharge planning Services  CM Consult  Post Acute Care Choice:    Choice offered to:     DME Arranged:    DME Agency:     HH Arranged:    HH Agency:     Status of Service:  In process, will continue to follow  If discussed at Long Length of Stay Meetings, dates discussed:    Additional Comments:  Lanier ClamMahabir, Taela Charbonneau, RN 03/31/2017, 11:17 AM

## 2017-04-01 DIAGNOSIS — I739 Peripheral vascular disease, unspecified: Secondary | ICD-10-CM

## 2017-04-01 DIAGNOSIS — I420 Dilated cardiomyopathy: Secondary | ICD-10-CM

## 2017-04-01 DIAGNOSIS — R748 Abnormal levels of other serum enzymes: Secondary | ICD-10-CM

## 2017-04-01 LAB — CBC
HCT: 27.7 % — ABNORMAL LOW (ref 39.0–52.0)
Hemoglobin: 8.9 g/dL — ABNORMAL LOW (ref 13.0–17.0)
MCH: 22.3 pg — ABNORMAL LOW (ref 26.0–34.0)
MCHC: 32.1 g/dL (ref 30.0–36.0)
MCV: 69.4 fL — ABNORMAL LOW (ref 78.0–100.0)
PLATELETS: 383 10*3/uL (ref 150–400)
RBC: 3.99 MIL/uL — ABNORMAL LOW (ref 4.22–5.81)
RDW: 18.5 % — AB (ref 11.5–15.5)
WBC: 6.8 10*3/uL (ref 4.0–10.5)

## 2017-04-01 LAB — GRAM STAIN

## 2017-04-01 LAB — BASIC METABOLIC PANEL
Anion gap: 8 (ref 5–15)
BUN: 17 mg/dL (ref 6–20)
CALCIUM: 8.3 mg/dL — AB (ref 8.9–10.3)
CO2: 28 mmol/L (ref 22–32)
CREATININE: 1.03 mg/dL (ref 0.61–1.24)
Chloride: 101 mmol/L (ref 101–111)
Glucose, Bld: 74 mg/dL (ref 65–99)
Potassium: 4.5 mmol/L (ref 3.5–5.1)
SODIUM: 137 mmol/L (ref 135–145)

## 2017-04-01 MED ORDER — HEPARIN BOLUS VIA INFUSION
4000.0000 [IU] | Freq: Once | INTRAVENOUS | Status: AC
  Administered 2017-04-01: 4000 [IU] via INTRAVENOUS
  Filled 2017-04-01: qty 4000

## 2017-04-01 MED ORDER — HEPARIN (PORCINE) IN NACL 100-0.45 UNIT/ML-% IJ SOLN
1450.0000 [IU]/h | INTRAMUSCULAR | Status: DC
  Administered 2017-04-01: 900 [IU]/h via INTRAVENOUS
  Filled 2017-04-01 (×2): qty 250

## 2017-04-01 MED ORDER — CARVEDILOL 3.125 MG PO TABS
3.1250 mg | ORAL_TABLET | Freq: Two times a day (BID) | ORAL | Status: DC
  Administered 2017-04-01 – 2017-04-06 (×9): 3.125 mg via ORAL
  Filled 2017-04-01 (×11): qty 1

## 2017-04-01 MED ORDER — LISINOPRIL 5 MG PO TABS
5.0000 mg | ORAL_TABLET | Freq: Every day | ORAL | Status: DC
  Administered 2017-04-01 – 2017-04-06 (×6): 5 mg via ORAL
  Filled 2017-04-01 (×6): qty 1

## 2017-04-01 MED ORDER — FUROSEMIDE 10 MG/ML IJ SOLN
40.0000 mg | Freq: Two times a day (BID) | INTRAMUSCULAR | Status: DC
  Administered 2017-04-01 – 2017-04-06 (×9): 40 mg via INTRAVENOUS
  Filled 2017-04-01 (×10): qty 4

## 2017-04-01 MED ORDER — ATORVASTATIN CALCIUM 20 MG PO TABS
20.0000 mg | ORAL_TABLET | Freq: Every day | ORAL | Status: DC
  Administered 2017-04-01 – 2017-04-03 (×3): 20 mg via ORAL
  Filled 2017-04-01 (×4): qty 1

## 2017-04-01 NOTE — Progress Notes (Deleted)
Cardiology Consultation:   Patient ID: Caleb Hays; [id number]; [birth year]   Admit date: 03/30/2017 Date of Consult: 04/01/2017  Primary Care Provider: Patient, No Pcp Per Primary Cardiologist: Allyson Sabal   Patient Profile:   Caleb Hays is a 76 y.o. male with a hx of PAD, former tobacco abuse, COPD, hypothyroidism but no known CAD who is being seen today for the evaluation of dyspnea, LV systolic dysfunction at the request of Dr. Sunnie Nielsen.  History of Present Illness:   Mr. Caleb Hays is a former smoker with severe PAD and COPD, anemia, hypothyroidism. He had been followed in our PV clinic by Dr. Allyson Sabal but has most recently had his PV interventions performed by the vascular surgeons. He has had right below knee amputation. He has had amputation of toes on his left foot. He had a recent percutaneous procedure on his left leg per Dr. Randie Heinz for a non healing wound on his LLE. He is not known to have CAD. He was admitted one week ago with a pneumonia and pleural effusion and had right thoracentesis last week. He was readmitted on 03/30/17 with worsened dyspnea, recurrent right pleural effusion. He had right thoracentesis yesterday. He has been receiving diuretics since admission. He reports improvement in dyspnea. Troponin slightly elevated on admission with flat trend (0.35, 0.37, 0.29). Echo 03/31/17 with LVEF=30-35%, mild AS, moderate MR, severe LAE, mild to moderate TR with estimated RVSP 57 mmHg.   He tells me today that he feels much better today after the thoracentesis. He denies any chest pain at home.   Past Medical History:  Diagnosis Date  . Adult failure to thrive    /notes 06/23/2015  . Anemia   . Arthritis    "hands, fingers" (11/10/2016  . COPD (chronic obstructive pulmonary disease) (HCC)    LONG TIME SMOKER  . Critical lower limb ischemia   . 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 Depression    "periods of depression" (06/23/2015)  . Gout    . Hypothyroidism   . PAD (peripheral artery disease) (HCC)   . Physical deconditioning   . Pyelonephritis 06/23/2015  . Status post foot surgery    right fifth toe amputation by Dr. Lajoyce Corners     Past Surgical History:  Procedure Laterality Date  . ABDOMINAL AORTOGRAM W/LOWER EXTREMITY N/A 09/03/2016   Procedure: Abdominal Aortogram w/Lower Extremity;  Surgeon: Sherren Kerns, MD;  Location: [facility] INVASIVE CV LAB;  Service: Cardiovascular;  Laterality: N/A;  Lt. leg  . ABDOMINAL AORTOGRAM W/LOWER EXTREMITY N/A 11/10/2016   Procedure: Abdominal Aortogram w/Lower Extremity;  Surgeon: Maeola Harman, MD;  Location: [facility] INVASIVE CV LAB;  Service: Cardiovascular;  Laterality: N/A;  . AMPUTATION Right 03/01/2014   Procedure: AMPUTATION RAY;  Surgeon: Nadara Mustard, MD;  Location: [facility] OR;  Service: Orthopedics;  Laterality: Right;  Right Foot 5th Ray Amputation  . AMPUTATION Right 04/26/2014   Procedure: Right Foot 4th Ray Amputation;  Surgeon: Nadara Mustard, MD;  Location: [facility] OR;  Service: Orthopedics;  Laterality: Right;  . AMPUTATION Right 10/01/2015   Procedure: Right Below Knee Amputation;  Surgeon: Nadara Mustard, MD;  Location: [facility] OR;  Service: Orthopedics;  Laterality: Right;  . AMPUTATION Left 10/08/2016   Procedure: Left Transmetatarsal Amputation;  Surgeon: Nadara Mustard, MD;  Location: [facility] OR;  Service: Orthopedics;  Laterality: Left;  . AMPUTATION Left 07/23/2016   Procedure: Left 2nd Toe Amputation;  Surgeon: Nadara Mustard, MD;  Location: [facility] OR;  Service: Orthopedics;  Laterality: Left;  . ANKLE ARTHROSCOPY Right 08/13/2015   Procedure: ANKLE ARTHROSCOPY;  Surgeon: Nadara Mustard, MD;  Location: [facility] OR;  Service: Orthopedics;  Laterality: Right;  . CATARACT EXTRACTION W/ INTRAOCULAR LENS  IMPLANT, BILATERAL Bilateral   . INGUINAL HERNIA REPAIR Left   . IR THORACENTESIS ASP PLEURAL SPACE W/IMG GUIDE  03/18/2017  . KNEE CARTILAGE SURGERY Left 1960's   football injury  . KNEE LIGAMENT  RECONSTRUCTION Left 1960's  . LOWER EXTREMITY ANGIOGRAM N/A 05/27/2014   Procedure: LOWER EXTREMITY ANGIOGRAM;  Surgeon: Runell Gess, MD;  Location: [facility] CATH LAB;  Service: Cardiovascular;  Laterality: N/A;  . LOWER EXTREMITY ANGIOGRAPHY Left 09/15/2016   Procedure: Lower Extremity Angiography;  Surgeon: Maeola Harman, MD;  Location: [facility] INVASIVE CV LAB;  Service: Cardiovascular;  Laterality: Left;  . PERIPHERAL VASCULAR ATHERECTOMY  11/10/2016   Procedure: Peripheral Vascular Atherectomy;  Surgeon: Maeola Harman, MD;  Location: [facility] INVASIVE CV LAB;  Service: Cardiovascular;;  . PERIPHERAL VASCULAR INTERVENTION Left 09/15/2016   Procedure: Peripheral Vascular Intervention;  Surgeon: Maeola Harman, MD;  Location: [facility] INVASIVE CV LAB;  Service: Cardiovascular;  Laterality: Left;  SFA/popiteal  . PERIPHERAL VASCULAR INTERVENTION  11/10/2016   Hattie Perch 11/10/2016  . PERIPHERAL VASCULAR INTERVENTION  11/10/2016   Procedure: Peripheral Vascular Intervention;  Surgeon: Maeola Harman, MD;  Location: [facility] INVASIVE CV LAB;  Service: Cardiovascular;;  . REFRACTIVE SURGERY Bilateral   . STUMP REVISION Right 12/05/2015   Procedure: Revision Right Below Knee Amputation;  Surgeon: Nadara Mustard, MD;  Location: [facility] OR;  Service: Orthopedics;  Laterality: Right;  . STUMP REVISION Left 11/19/2016   Procedure: Revision Left Transmetatarsal Amputation;  Surgeon: Nadara Mustard, MD;  Location: [facility] OR;  Service: Orthopedics;  Laterality: Left;    Inpatient Medications: Scheduled Meds: . allopurinol  100 mg Oral Daily  . aspirin  81 mg Oral Daily  . cholecalciferol  1,000 Units Oral Daily  . clopidogrel  75 mg Oral Q breakfast  . enoxaparin (LOVENOX) injection  40 mg Subcutaneous QHS  . furosemide  40 mg Intravenous BID  . gabapentin  200 mg Oral QHS  . ipratropium-albuterol  3 mL Nebulization QID  . levothyroxine  12.5 mcg Oral Q0600  . lisinopril  5 mg Oral Daily  .  multivitamin with minerals  1 tablet Oral Daily  . pantoprazole  40 mg Oral Daily  . potassium chloride  20 mEq Oral Daily  . saccharomyces boulardii  250 mg Oral BID  . sodium chloride flush  3 mL Intravenous Q12H  . sodium chloride flush  3 mL Intravenous Q12H  . traZODone  50 mg Oral QHS   Continuous Infusions: . sodium chloride     PRN Meds: sodium chloride, acetaminophen **OR** acetaminophen, guaiFENesin, ipratropium-albuterol, methocarbamol, ondansetron **OR** ondansetron (ZOFRAN) IV, oxyCODONE, senna-docusate, sodium chloride flush, traMADol  Allergies:   No Known Allergies  Social History:   Social History   Social History  . Marital status: Single    Spouse name: N/A  . Number of children: N/A  . Years of education: N/A   Occupational History  . Not on file.   Social History Main Topics  . Smoking status: Former Smoker    Packs/day: 1.00    Years: 57.00    Types: Cigarettes    Quit date: 03/19/2016  . Smokeless tobacco: Never Used  . Alcohol use No     Comment: 11/10/2016 I was drinking  alot of beer; quit in 2015"   . Drug use: No  . Sexual activity: No   Other Topics Concern  . Not on file   Social History Narrative  . No narrative on file    Family History:    Family History  Problem Relation Age of Onset  . Heart failure Father   . Heart attack Father   . Heart failure Brother      ROS:  Please see the history of present illness.  ROS  All other ROS reviewed and negative.     Physical Exam/Data:   Vitals:   04/01/17 0500 04/01/17 0752 04/01/17 1002 04/01/17 1249  BP: 138/72  116/68   Pulse: 70     Resp: 18     Temp: 97.8 F (36.6 C)     TempSrc: Oral     SpO2: 100% 91%  96%  Weight: 165 lb 2 oz (74.9 kg)     Height:        Intake/Output Summary (Last 24 hours) at 04/01/17 1409 Last data filed at 04/01/17 1049  Gross per 24 hour  Intake              600 ml  Output             2375 ml  Net            -1775 ml   Filed Weights    03/30/17 1434 03/30/17 2316 04/01/17 0500  Weight: 169 lb (76.7 kg) 176 lb 5.9 oz (80 kg) 165 lb 2 oz (74.9 kg)   Body mass index is 20.1 kg/m.  General:  Thin, cachectic male in NAD.  HEENT: normal Lymph: no adenopathy Neck: no JVD Endocrine:  No thryomegaly Cardiac:  normal S1, S2; RRR; systolic murmur noted.  Lungs:  Decreased BS right base. No wheezes noted.   Abd: soft, nontender Ext: right BKA, left leg is thin with lateral wounds lower leg. 1 + edema left ankle Skin: warm and dry  Neuro:  CNs 2-12 intact, no focal abnormalities noted Psych:  Normal affect   EKG:  The EKG was personally reviewed and demonstrates:  Sinus with low voltage.  Telemetry:  Telemetry was personally reviewed and demonstrates:  Sinus with PVCs  Relevant CV Studies: Echo 03/31/17:  Left ventricle: The cavity size was normal. Wall thickness was   normal. Systolic function was moderately to severely reduced. The   estimated ejection fraction was in the range of 30% to 35%.   Features are consistent with a pseudonormal left ventricular   filling pattern, with concomitant abnormal relaxation and   increased filling pressure (grade 2 diastolic dysfunction). - Aortic valve: There was very mild stenosis. Valve area (VTI):   1.15 cm^2. Valve area (Vmax): 1.08 cm^2. Valve area (Vmean): 1.04   cm^2. - Mitral valve: There was moderate regurgitation. - Left atrium: The atrium was severely dilated. - Right ventricle: Systolic function was mildly reduced. - Right atrium: The atrium was moderately to severely dilated. - Tricuspid valve: There was mild-moderate regurgitation. - Pulmonary arteries: Systolic pressure was moderately increased.   PA peak pressure: 57 mm Hg (S).  Laboratory Data:  Chemistry Recent Labs Lab 03/30/17 1622 03/31/17 0457 04/01/17 0825  NA 139 138 137  K 4.5 4.3 4.5  CL 104 103 101  CO2 GLUCOSE 81 87 74  BUN CREATININE 1.17 1.08 1.03  CALCIUM 8.6* 8.4*  8.3*  GFRNONAA 59* >60 >60  GFRAA >60 >60 >60  ANIONGAP Recent Labs Lab 03/30/17 1622  PROT 5.9*  ALBUMIN 3.2*  AST 17  ALT 10*  ALKPHOS 70  BILITOT 0.7   Hematology Recent Labs Lab 03/30/17 1622 03/31/17 0457 04/01/17 0825  WBC 7.5 6.9 6.8  RBC 4.42 4.22 3.99*  HGB 9.5* 9.2* 8.9*  HCT 30.5* 29.1* 27.7*  MCV 69.0* 69.0* 69.4*  MCH 21.5* 21.8* 22.3*  MCHC 31.1 31.6 32.1  RDW 18.2* 18.3* 18.5*  PLT 396 376 383   Cardiac Enzymes Recent Labs Lab 03/30/17 1622 03/30/17 2350 03/31/17 0457 03/31/17 0936  TROPONINI 0.35* 0.37* 0.32* 0.29*   No results for input(s): TROPIPOC in the last 168 hours.  BNP Recent Labs Lab 03/30/17 1622  BNP 1,074.4*    DDimer No results for input(s): DDIMER in the last 168 hours.  Radiology/Studies:  Dg Chest 1 View  Result Date: 03/31/2017 CLINICAL DATA:  Status post right thoracentesis. EXAM: CHEST 1 VIEW COMPARISON:  03/30/2017 FINDINGS: Since prior exam, there has been a significant decrease in right pleural fluid following thoracentesis. Residual right lung base opacity is likely combination of atelectasis and minimal residual fluid. There is no pneumothorax or evidence of a procedure complication. No other change. IMPRESSION: 1. No pneumothorax or evidence of a complication following thoracentesis. 2. Significant reduction in the right pleural effusion following thoracentesis. Electronically Signed   By: Amie Portland M.D.   On: 03/31/2017 17:48   Dg Chest 2 View  Result Date: 03/30/2017 CLINICAL DATA:  Shortness of breath. EXAM: CHEST  2 VIEW COMPARISON:  March 19, 2017. FINDINGS: Stable cardiomegaly. Normal pulmonary vascularity. New moderate loculated right pleural effusion. Trace left pleural effusion. Bibasilar atelectasis. No pneumothorax. No acute osseous abnormality. IMPRESSION: Interval reaccumulation of moderate loculated right pleural effusion. Electronically Signed   By: Obie Dredge M.D.   On: 03/30/2017  15:54   US Thoracentesis Asp Pleural Space W/img Guide  Result Date: 04/01/2017 INDICATION: COPD, dyspnea, recurrent right pleural effusion. Request made for diagnostic and therapeutic right thoracentesis. EXAM: ULTRASOUND GUIDED DIAGNOSTIC AND THERAPEUTIC RIGHT THORACENTESIS MEDICATIONS: None. COMPLICATIONS: None immediate. PROCEDURE: Caleb ultrasound guided thoracentesis was thoroughly discussed with the patient and questions answered. The benefits, risks, alternatives and complications were also discussed. The patient understands and wishes to proceed with the procedure. Written consent was obtained. Ultrasound was performed to localize and mark Caleb adequate pocket of fluid in the right chest. The area was then prepped and draped in the normal sterile fashion. 1% Lidocaine was used for local anesthesia. Under ultrasound guidance a Safe-T-Centesis catheter was introduced. Thoracentesis was performed. The catheter was removed and a dressing applied. FINDINGS: A total of approximately 2.2 liters of yellow fluid was removed. Samples were sent to the laboratory as requested by the clinical team. IMPRESSION: Successful ultrasound guided diagnostic and therapeutic right thoracentesis yielding 2.2 liters of pleural fluid. Read by: Jeananne Rama, PA-C Electronically Signed   By: Jolaine Click M.D.   On: 03/31/2017 17:28    Assessment and Plan:   1. Cardiomyopathy, unknown etiology 2. Elevated troponin 3. Severe PAD 4. Recurrent right pleural effusion in setting of volume overload  LVEF is reduced. 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 He has severe PAD but no prior cardiac cath. He has a high probability of obstructive CAD. He is admitted with dyspnea which may be due to his volume overload and pleural effusion. He has no chest pain. Troponin is  mildly elevated. I think cardiac cath is indicated. Renal function is normal. He is anemic. Would continue diuresis this weekend and  plan cardiac cath next week if H/H stable. He will need a right and left heart cath.   -Will continue ASA and Plavix -Will d/c Lovenox and start IV heparin -Will continue Ace-inh.  -Will start a statin -Will start low dose Coreg -We will follow along this weekend   For questions or updates, please contact [facility] Please consult [email or web address] for contact info under Cardiology/STEMI.   Signed, Verne Carrow, MD  04/01/2017 2:09 PM

## 2017-04-01 NOTE — Progress Notes (Signed)
ANTICOAGULATION CONSULT NOTE - Initial Consult  Pharmacy Consult for heparin  Indication: chest pain/ACS  No Known Allergies  Patient Measurements: Height:  (193 cm) Weight: 165 lb 2 oz (74.9 kg) IBW/kg (Calculated) : 86.8 Heparin Dosing Weight: 75kg  Vital Signs: Temp: 97.8 F (36.6 C) (09/14 0500) Temp Source: Oral (09/14 0500) BP: 116/68 (09/14 1002) Pulse Rate: 70 (09/14 0500)  Labs:  Recent Labs  03/30/17 1622 03/30/17 2350 03/31/17 0457 03/31/17 0936 04/01/17 0825  HGB 9.5*  --  9.2*  --  8.9*  HCT 30.5*  --  29.1*  --  27.7*  PLT 396  --  376  --  383  CREATININE 1.17  --  1.08  --  1.03  TROPONINI 0.35* 0.37* 0.32* 0.29*  --     Estimated Creatinine Clearance: 65.6 mL/min (by C-G formula based on SCr of 1.03 mg/dL).   Medical History: Past Medical History:  Diagnosis Date  . Adult failure to thrive    /notes 06/23/2015  . Anemia   . Arthritis    "hands, fingers" (11/10/2016  . COPD (chronic obstructive pulmonary disease) (HCC)    LONG TIME SMOKER  . Critical lower limb ischemia   . 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 Depression    "periods of depression" (06/23/2015)  . Gout   . Hypothyroidism   . PAD (peripheral artery disease) (HCC)   . Physical deconditioning   . Pyelonephritis 06/23/2015  . Status post foot surgery    right fifth toe amputation by Dr. Lajoyce Corners     Assessment: 49 YOF presenting to [facility] ED 9/12 with shortness of breath.  He has h/o PAD (s/p R BKA). Troponin elevated, cardiology has seen patient and concerned for obstructive CAD so pharmacy consulted to dose heparin gtt.  He is also on ASA + Plavix, statin (? Increase to ), beta-blocker.   - SQ enoxaparin given at ~23:00 9/13 - Thoracentesis performed 9/13  Today, 04/01/2017  CBC: Hgb decreased but relatively stable, pltc WNL  Renal: SCr WNL  Goal of Therapy:  Heparin level 0.3-0.7 units/ml Monitor platelets by anticoagulation  protocol: Yes   Plan:  Give 4000 units bolus x 1 Start heparin infusion at 900 units/hr Check anti-Xa level in 8 hours and daily while on heparin Continue to monitor H&H and platelets  Juliette Alcide, PharmD, BCPS.   Pager: [phone] 04/01/2017 3:05 PM

## 2017-04-01 NOTE — Progress Notes (Signed)
PROGRESS NOTE    Caleb Hays  ZOX:[id number] DOB: 01-Jul-1941 DOA: 03/30/2017 PCP: Patient, No Pcp Per    Brief Narrative: Caleb Hays is a 76 y.o. 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 Patient was admitted to the hospital 2 weeks ago with hypoxia found to have a right-sided pleural effusion, underwent thoracentesis with 2 L removed was treated with antibiotics and completed a course of Levaquin after discharge, and felt as though he was back to his baseline briefly before the insidious development of shortness of breath over the past several days. Patient denies chest pain, denies any significant cough, and denies fevers or chills. Reports that he was slightly dyspneic a few days ago, but this has progressively worsened to the point where he has trouble catching his breath while at rest today. X-ray was performed at his nursing facility and concerning for reaccumulation of the pleural effusion on the right side.  ED Course: Upon arrival to the ED, patient is found to be afebrile, saturating adequately on 4 L/m supplemental oxygen, and with vitals otherwise stable. EKG features a sinus rhythm with low-voltage QRS and chest x-ray is notable for interval reaccumulation of moderate loculated right pleural effusion. Chemistry panel was unremarkable and CBC is notable for stable microcytic anemia with hemoglobin of 9.5 and MCV of 69.0. Troponin is elevated to 0.35. Patient was treated with 324 mg of aspirin and DuoNeb in the ED. He remained hemodynamically stable, continues to deny any chest pain, is dyspneic but not in acute distress, and will be admitted to the telemetry unit for ongoing evaluation and management of recurrent dyspnea with hypoxic respiratory  failure secondary to right-sided pleural effusion.    Assessment & Plan:   Principal Problem:   Recurrent right pleural effusion Active Problems:   COPD (chronic obstructive pulmonary disease) (HCC)   Anemia of chronic disease   Hypothyroidism   Elevated troponin   Chronic respiratory failure with hypoxia (HCC)   Recurrent right loculated pleural effusion; Acute hypoxic Respiratory Failure. Related to Heart Failure.  BNP elevated.  Repeat thoracentesis. cell count normal. , culture  Pending.  Discontinue antibiotics, pleural fluid, gram stain negative, no significant WBC. Cytology pending.   Continue with lasix.   Acute systolic and diastolic HF;  EF 35 %, diastolic dysfunction grade 2.  Continue with lasix.  Start ACE.  Cardiology consulted.   Mild elevation troponin.  Demand.  ECHO. Ef 35 %  EKG low voltage criteria.  Continue ASA 81 and Plavix  Cardiology consulted.   Hypothyroidism;  Continue with synthroid.   COPD  - No cough or wheeze on admission  - Continue DuoNeb and supplemental O2   Microcytic anemia  - Hgb is stable at 9.5 with no bleeding evident  - Recent anemia panel consistent with iron-deficiency    DVT prophylaxis: SCD, lovenox Code Status: Full code.  Family Communication: discussed with patient.  Disposition Plan:  To be determine  Consultants:   none   Procedures: thoracentesis   Antimicrobials:  Ceftriaxone and Azithromycin discontinue   Subjective: Patient breathing better   Objective: Vitals:   04/01/17 0500 04/01/17 0752 04/01/17 1002 04/01/17 1249  BP: 138/72  116/68   Pulse: 70     Resp: 18     Temp: 97.8 F (36.6 C)  TempSrc: Oral     SpO2: 100% 91%  96%  Weight: 74.9 kg (165 lb 2 oz)     Height:        Intake/Output Summary (Last 24 hours) at 04/01/17 1403 Last data filed at 04/01/17 1049  Gross per 24 hour  Intake              600 ml  Output             2375 ml  Net            -1775 ml   Filed  Weights   03/30/17 1434 03/30/17 2316 04/01/17 0500  Weight: 76.7 kg (169 lb) 80 kg (176 lb 5.9 oz) 74.9 kg (165 lb 2 oz)    Examination:  General exam: NAD Respiratory system: Bilateral crackles.  Cardiovascular system: S1, S 2 RRR Gastrointestinal system: Abdomen is soft, nrt Central nervous system: Alert and oriented.  Extremities: plus 1 edema, right BKA, left foot post amputation  Skin: No rashes, lesions or ulcers    Data Reviewed: I have personally reviewed following labs and imaging studies  CBC:  Recent Labs Lab 03/30/17 1622 03/31/17 0457 04/01/17 0825  WBC 7.5 6.9 6.8  NEUTROABS 3.9  --   --   HGB 9.5* 9.2* 8.9*  HCT 30.5* 29.1* 27.7*  MCV 69.0* 69.0* 69.4*  PLT 396 376 383   Basic Metabolic Panel:  Recent Labs Lab 03/30/17 1622 03/31/17 0457 04/01/17 0825  NA 139 138 137  K 4.5 4.3 4.5  CL 104 103 101  CO2 GLUCOSE 81 87 74  BUN CREATININE 1.17 1.08 1.03  CALCIUM 8.6* 8.4* 8.3*   GFR: Estimated Creatinine Clearance: 65.6 mL/min (by C-G formula based on SCr of 1.03 mg/dL). Liver Function Tests:  Recent Labs Lab 03/30/17 1622  AST 17  ALT 10*  ALKPHOS 70  BILITOT 0.7  PROT 5.9*  ALBUMIN 3.2*   No results for input(s): LIPASE, AMYLASE in the last 168 hours. No results for input(s): AMMONIA in the last 168 hours. Coagulation Profile: No results for input(s): INR, PROTIME in the last 168 hours. Cardiac Enzymes:  Recent Labs Lab 03/30/17 1622 03/30/17 2350 03/31/17 0457 03/31/17 0936  TROPONINI 0.35* 0.37* 0.32* 0.29*   BNP (last 3 results) No results for input(s): PROBNP in the last 8760 hours. HbA1C: No results for input(s): HGBA1C in the last 72 hours. CBG: No results for input(s): GLUCAP in the last 168 hours. Lipid Profile: No results for input(s): CHOL, HDL, LDLCALC, TRIG, CHOLHDL, LDLDIRECT in the last 72 hours. Thyroid Function Tests: No results for input(s): TSH, T4TOTAL, FREET4, T3FREE, THYROIDAB  in the last 72 hours. Anemia Panel: No results for input(s): VITAMINB12, FOLATE, FERRITIN, TIBC, IRON, RETICCTPCT in the last 72 hours. Sepsis Labs: No results for input(s): PROCALCITON, LATICACIDVEN in the last 168 hours.  Recent Results (from the past 240 hour(s))  MRSA PCR Screening     Status: None   Collection Time: 03/31/17  6:45 AM  Result Value Ref Range Status   MRSA by PCR NEGATIVE NEGATIVE Final    Comment:        The GeneXpert MRSA Assay (FDA approved for NASAL specimens only), is one component of a comprehensive MRSA colonization surveillance program. It is not intended to diagnose MRSA infection nor to guide or monitor treatment for MRSA infections.   Culture, body fluid-bottle     Status: None (Preliminary result)   Collection  Time: 03/31/17  5:38 PM  Result Value Ref Range Status   Specimen Description PLEURAL FLUID  Final   Special Requests BOTTLES DRAWN AEROBIC AND ANAEROBIC  Final   Culture   Final    NO GROWTH < 12 HOURS Performed at [facility], [address], [place], [place] [address]    Report Status PENDING  Incomplete  Gram stain     Status: None   Collection Time: 03/31/17  5:38 PM  Result Value Ref Range Status   Specimen Description PLEURAL FLUID  Final   Special Requests NONE  Final   Gram Stain   Final    CYTOSPIN SMEAR WBC PRESENT,BOTH PMN AND MONONUCLEAR NO ORGANISMS SEEN Performed at [facility], [address] [place], [place] [address]    Report Status 04/01/2017 FINAL  Final         Radiology Studies: Dg Chest 1 View  Result Date: 03/31/2017 CLINICAL DATA:  Status post right thoracentesis. EXAM: CHEST 1 VIEW COMPARISON:  03/30/2017 FINDINGS: Since prior exam, there has been a significant decrease in right pleural fluid following thoracentesis. Residual right lung base opacity is likely combination of atelectasis and minimal residual fluid. There is no pneumothorax or evidence of a procedure complication. No  other change. IMPRESSION: 1. No pneumothorax or evidence of a complication following thoracentesis. 2. Significant reduction in the right pleural effusion following thoracentesis. Electronically Signed   By: Amie Portland M.D.   On: 03/31/2017 17:48   Dg Chest 2 View  Result Date: 03/30/2017 CLINICAL DATA:  Shortness of breath. EXAM: CHEST  2 VIEW COMPARISON:  March 19, 2017. FINDINGS: Stable cardiomegaly. Normal pulmonary vascularity. New moderate loculated right pleural effusion. Trace left pleural effusion. Bibasilar atelectasis. No pneumothorax. No acute osseous abnormality. IMPRESSION: Interval reaccumulation of moderate loculated right pleural effusion. Electronically Signed   By: Obie Dredge M.D.   On: 03/30/2017 15:54   US Thoracentesis Asp Pleural Space W/img Guide  Result Date: 04/01/2017 INDICATION: COPD, dyspnea, recurrent right pleural effusion. Request made for diagnostic and therapeutic right thoracentesis. EXAM: ULTRASOUND GUIDED DIAGNOSTIC AND THERAPEUTIC RIGHT THORACENTESIS MEDICATIONS: None. COMPLICATIONS: None immediate. PROCEDURE: An ultrasound guided thoracentesis was thoroughly discussed with the patient and questions answered. The benefits, risks, alternatives and complications were also discussed. The patient understands and wishes to proceed with the procedure. Written consent was obtained. Ultrasound was performed to localize and mark an adequate pocket of fluid in the right chest. The area was then prepped and draped in the normal sterile fashion. 1% Lidocaine was used for local anesthesia. Under ultrasound guidance a Safe-T-Centesis catheter was introduced. Thoracentesis was performed. The catheter was removed and a dressing applied. FINDINGS: A total of approximately 2.2 liters of yellow fluid was removed. Samples were sent to the laboratory as requested by the clinical team. IMPRESSION: Successful ultrasound guided diagnostic and therapeutic right thoracentesis yielding  2.2 liters of pleural fluid. Read by: Jeananne Rama, PA-C Electronically Signed   By: Jolaine Click M.D.   On: 03/31/2017 17:28        Scheduled Meds: . allopurinol  100 mg Oral Daily  . aspirin  81 mg Oral Daily  . cholecalciferol  1,000 Units Oral Daily  . clopidogrel  75 mg Oral Q breakfast  . enoxaparin (LOVENOX) injection  40 mg Subcutaneous QHS  . furosemide  40 mg Intravenous BID  . gabapentin  200 mg Oral QHS  . ipratropium-albuterol  3 mL Nebulization QID  . levothyroxine  12.5  mcg Oral Q0600  . lisinopril  5 mg Oral Daily  . multivitamin with minerals  1 tablet Oral Daily  . pantoprazole  40 mg Oral Daily  . potassium chloride  20 mEq Oral Daily  . saccharomyces boulardii  250 mg Oral BID  . sodium chloride flush  3 mL Intravenous Q12H  . sodium chloride flush  3 mL Intravenous Q12H  . traZODone  50 mg Oral QHS   Continuous Infusions: . sodium chloride       LOS: 2 days    Time spent: 35 minutes,     Ardeth Repetto, Prentiss Bells, MD [facility] Pager [phone]  If 7PM-7AM, please contact night-coverage [email or web address] Password [email or web address] 04/01/2017, 2:03 PM

## 2017-04-01 NOTE — Consult Note (Signed)
 Hide copied text   Cardiology Consultation:   Patient ID: Caleb Hays; [id number]; 10-Jun-1941   Admit date: 03/30/2017 Date of Consult: 04/01/2017  Primary Care Provider: Patient, No Pcp Per Primary Cardiologist: Allyson Sabal   Patient Profile:   Caleb Hays is a 76 y.o. male with a hx of PAD, former tobacco abuse, COPD, hypothyroidism but no known CAD who is being seen today for the evaluation of dyspnea, LV systolic dysfunction at the request of Dr. Sunnie Nielsen.  History of Present Illness:   Caleb Hays is a former smoker with severe PAD and COPD, anemia, hypothyroidism. He had been followed in our PV clinic by Dr. Allyson Sabal but has most recently had his PV interventions performed by the vascular surgeons. He has had right below knee amputation. He has had amputation of toes on his left foot. He had a recent percutaneous procedure on his left leg per Dr. Randie Heinz for a non healing wound on his LLE. He is not known to have CAD. He was admitted one week ago with a pneumonia and pleural effusion and had right thoracentesis last week. He was readmitted on 03/30/17 with worsened dyspnea, recurrent right pleural effusion. He had right thoracentesis yesterday. He has been receiving diuretics since admission. He reports improvement in dyspnea. Troponin slightly elevated on admission with flat trend (0.35, 0.37, 0.29). Echo 03/31/17 with LVEF=30-35%, mild AS, moderate MR, severe LAE, mild to moderate TR with estimated RVSP 57 mmHg.   He tells me today that he feels much better today after the thoracentesis. He denies any chest pain at home.       Past Medical History:  Diagnosis Date  . Adult failure to thrive    /notes 06/23/2015  . Anemia   . Arthritis    "hands, fingers" (11/10/2016  . COPD (chronic obstructive pulmonary disease) (HCC)    LONG TIME SMOKER  . Critical lower limb ischemia   . 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 Depression      "periods of depression" (06/23/2015)  . Gout   . Hypothyroidism   . PAD (peripheral artery disease) (HCC)   . Physical deconditioning   . Pyelonephritis 06/23/2015  . Status post foot surgery    right fifth toe amputation by Dr. Lajoyce Corners          Past Surgical History:  Procedure Laterality Date  . ABDOMINAL AORTOGRAM W/LOWER EXTREMITY N/A 09/03/2016   Procedure: Abdominal Aortogram w/Lower Extremity;  Surgeon: Sherren Kerns, MD;  Location: [facility] INVASIVE CV LAB;  Service: Cardiovascular;  Laterality: N/A;  Lt. leg  . ABDOMINAL AORTOGRAM W/LOWER EXTREMITY N/A 11/10/2016   Procedure: Abdominal Aortogram w/Lower Extremity;  Surgeon: Maeola Harman, MD;  Location: [facility] INVASIVE CV LAB;  Service: Cardiovascular;  Laterality: N/A;  . AMPUTATION Right 03/01/2014   Procedure: AMPUTATION RAY;  Surgeon: Nadara Mustard, MD;  Location: [facility] OR;  Service: Orthopedics;  Laterality: Right;  Right Foot 5th Ray Amputation  . AMPUTATION Right 04/26/2014   Procedure: Right Foot 4th Ray Amputation;  Surgeon: Nadara Mustard, MD;  Location: [facility] OR;  Service: Orthopedics;  Laterality: Right;  . AMPUTATION Right 10/01/2015   Procedure: Right Below Knee Amputation;  Surgeon: Nadara Mustard, MD;  Location: [facility] OR;  Service: Orthopedics;  Laterality: Right;  . AMPUTATION Left 10/08/2016   Procedure: Left Transmetatarsal Amputation;  Surgeon: Nadara Mustard, MD;  Location: [facility] OR;  Service: Orthopedics;  Laterality: Left;  . AMPUTATION Left  07/23/2016   Procedure: Left 2nd Toe Amputation;  Surgeon: Nadara Mustard, MD;  Location: [facility] OR;  Service: Orthopedics;  Laterality: Left;  . ANKLE ARTHROSCOPY Right 08/13/2015   Procedure: ANKLE ARTHROSCOPY;  Surgeon: Nadara Mustard, MD;  Location: [facility] OR;  Service: Orthopedics;  Laterality: Right;  . CATARACT EXTRACTION W/ INTRAOCULAR LENS  IMPLANT, BILATERAL Bilateral   . INGUINAL HERNIA REPAIR Left   . IR THORACENTESIS ASP PLEURAL SPACE W/IMG GUIDE  03/18/2017  .  KNEE CARTILAGE SURGERY Left 1960's   football injury  . KNEE LIGAMENT RECONSTRUCTION Left 1960's  . LOWER EXTREMITY ANGIOGRAM N/A 05/27/2014   Procedure: LOWER EXTREMITY ANGIOGRAM;  Surgeon: Runell Gess, MD;  Location: [facility] CATH LAB;  Service: Cardiovascular;  Laterality: N/A;  . LOWER EXTREMITY ANGIOGRAPHY Left 09/15/2016   Procedure: Lower Extremity Angiography;  Surgeon: Maeola Harman, MD;  Location: [facility] INVASIVE CV LAB;  Service: Cardiovascular;  Laterality: Left;  . PERIPHERAL VASCULAR ATHERECTOMY  11/10/2016   Procedure: Peripheral Vascular Atherectomy;  Surgeon: Maeola Harman, MD;  Location: [facility] INVASIVE CV LAB;  Service: Cardiovascular;;  . PERIPHERAL VASCULAR INTERVENTION Left 09/15/2016   Procedure: Peripheral Vascular Intervention;  Surgeon: Maeola Harman, MD;  Location: [facility] INVASIVE CV LAB;  Service: Cardiovascular;  Laterality: Left;  SFA/popiteal  . PERIPHERAL VASCULAR INTERVENTION  11/10/2016   Hattie Perch 11/10/2016  . PERIPHERAL VASCULAR INTERVENTION  11/10/2016   Procedure: Peripheral Vascular Intervention;  Surgeon: Maeola Harman, MD;  Location: [facility] INVASIVE CV LAB;  Service: Cardiovascular;;  . REFRACTIVE SURGERY Bilateral   . STUMP REVISION Right 12/05/2015   Procedure: Revision Right Below Knee Amputation;  Surgeon: Nadara Mustard, MD;  Location: [facility] OR;  Service: Orthopedics;  Laterality: Right;  . STUMP REVISION Left 11/19/2016   Procedure: Revision Left Transmetatarsal Amputation;  Surgeon: Nadara Mustard, MD;  Location: [facility] OR;  Service: Orthopedics;  Laterality: Left;    Inpatient Medications: Scheduled Meds: . allopurinol  100 mg Oral Daily  . aspirin  81 mg Oral Daily  . cholecalciferol  1,000 Units Oral Daily  . clopidogrel  75 mg Oral Q breakfast  . enoxaparin (LOVENOX) injection  40 mg Subcutaneous QHS  . furosemide  40 mg Intravenous BID  . gabapentin  200 mg Oral QHS  . ipratropium-albuterol  3 mL Nebulization QID   . levothyroxine  12.5 mcg Oral Q0600  . lisinopril  5 mg Oral Daily  . multivitamin with minerals  1 tablet Oral Daily  . pantoprazole  40 mg Oral Daily  . potassium chloride  20 mEq Oral Daily  . saccharomyces boulardii  250 mg Oral BID  . sodium chloride flush  3 mL Intravenous Q12H  . sodium chloride flush  3 mL Intravenous Q12H  . traZODone  50 mg Oral QHS   Continuous Infusions: . sodium chloride     PRN Meds: sodium chloride, acetaminophen **OR** acetaminophen, guaiFENesin, ipratropium-albuterol, methocarbamol, ondansetron **OR** ondansetron (ZOFRAN) IV, oxyCODONE, senna-docusate, sodium chloride flush, traMADol  Allergies:   No Known Allergies  Social History:   Social History        Social History  . Marital status: Single    Spouse name: N/A  . Number of children: N/A  . Years of education: N/A      Occupational History  . Not on file.         Social History Main Topics  . Smoking status: Former Smoker    Packs/day: 1.00    Years: 57.00  Types: Cigarettes    Quit date: 03/19/2016  . Smokeless tobacco: Never Used  . Alcohol use No     Comment: 11/10/2016 I was drinking alot of beer; quit in 2015"   . Drug use: No  . Sexual activity: No       Other Topics Concern  . Not on file      Social History Narrative  . No narrative on file    Family History:         Family History  Problem Relation Age of Onset  . Heart failure Father   . Heart attack Father   . Heart failure Brother      ROS:  Please see the history of present illness.  ROS  All other ROS reviewed and negative.     Physical Exam/Data:         Vitals:   04/01/17 0500 04/01/17 0752 04/01/17 1002 04/01/17 1249  BP: 138/72  116/68   Pulse: 70     Resp: 18     Temp: 97.8 F (36.6 C)     TempSrc: Oral     SpO2: 100% 91%  96%  Weight: 165 lb 2 oz (74.9 kg)     Height:        Intake/Output Summary (Last 24 hours) at  04/01/17 1409 Last data filed at 04/01/17 1049  Gross per 24 hour  Intake              600 ml  Output             2375 ml  Net            -1775 ml        Filed Weights   03/30/17 1434 03/30/17 2316 04/01/17 0500  Weight: 169 lb (76.7 kg) 176 lb 5.9 oz (80 kg) 165 lb 2 oz (74.9 kg)   Body mass index is 20.1 kg/m.  General:  Thin, cachectic male in NAD.  HEENT: normal Lymph: no adenopathy Neck: no JVD Endocrine:  No thryomegaly Cardiac:  normal S1, S2; RRR; systolic murmur noted.  Lungs:  Decreased BS right base. No wheezes noted.   Abd: soft, nontender Ext: right BKA, left leg is thin with lateral wounds lower leg. 1 + edema left ankle Skin: warm and dry  Neuro:  CNs 2-12 intact, no focal abnormalities noted Psych:  Normal affect   EKG:  The EKG was personally reviewed and demonstrates:  Sinus with low voltage.  Telemetry:  Telemetry was personally reviewed and demonstrates:  Sinus with PVCs  Relevant CV Studies: Echo 03/31/17:  Left ventricle: The cavity size was normal. Wall thickness was normal. Systolic function was moderately to severely reduced. The estimated ejection fraction was in the range of 30% to 35%. Features are consistent with a pseudonormal left ventricular filling pattern, with concomitant abnormal relaxation and increased filling pressure (grade 2 diastolic dysfunction). - Aortic valve: There was very mild stenosis. Valve area (VTI): 1.15 cm^2. Valve area (Vmax): 1.08 cm^2. Valve area (Vmean): 1.04 cm^2. - Mitral valve: There was moderate regurgitation. - Left atrium: The atrium was severely dilated. - Right ventricle: Systolic function was mildly reduced. - Right atrium: The atrium was moderately to severely dilated. - Tricuspid valve: There was mild-moderate regurgitation. - Pulmonary arteries: Systolic pressure was moderately increased. PA peak pressure: 57 mm Hg (S).  Laboratory Data:  Chemistry Last Labs    Recent  Labs Lab 03/30/17 1622 03/31/17 0457 04/01/17 0825  NA 139 138 137  K 4.5 4.3 4.5  CL 104 103 101  CO2 27 27 28   GLUCOSE 81 87 74  BUN 20 20 17   CREATININE 1.17 1.08 1.03  CALCIUM 8.6* 8.4* 8.3*  GFRNONAA 59* >60 >60  GFRAA >60 >60 >60  ANIONGAP 8 8 8        Last Labs    Recent Labs Lab 03/30/17 1622  PROT 5.9*  ALBUMIN 3.2*  AST 17  ALT 10*  ALKPHOS 70  BILITOT 0.7     Hematology Last Labs    Recent Labs Lab 03/30/17 1622 03/31/17 0457 04/01/17 0825  WBC 7.5 6.9 6.8  RBC 4.42 4.22 3.99*  HGB 9.5* 9.2* 8.9*  HCT 30.5* 29.1* 27.7*  MCV 69.0* 69.0* 69.4*  MCH 21.5* 21.8* 22.3*  MCHC 31.1 31.6 32.1  RDW 18.2* 18.3* 18.5*  PLT 396 376 383     Cardiac Enzymes Last Labs    Recent Labs Lab 03/30/17 1622 03/30/17 2350 03/31/17 0457 03/31/17 0936  TROPONINI 0.35* 0.37* 0.32* 0.29*      Last Labs   No results for input(s): TROPIPOC in the last 168 hours.    BNP Last Labs    Recent Labs Lab 03/30/17 1622  BNP 1,074.4*      DDimer  Last Labs   No results for input(s): DDIMER in the last 168 hours.    Radiology/Studies:  Dg Chest 1 View  Result Date: 03/31/2017 CLINICAL DATA:  Status post right thoracentesis. EXAM: CHEST 1 VIEW COMPARISON:  03/30/2017 FINDINGS: Since prior exam, there has been a significant decrease in right pleural fluid following thoracentesis. Residual right lung base opacity is likely combination of atelectasis and minimal residual fluid. There is no pneumothorax or evidence of a procedure complication. No other change. IMPRESSION: 1. No pneumothorax or evidence of a complication following thoracentesis. 2. Significant reduction in the right pleural effusion following thoracentesis. Electronically Signed   By: Amie Portland M.D.   On: 03/31/2017 17:48   Dg Chest 2 View  Result Date: 03/30/2017 CLINICAL DATA:  Shortness of breath. EXAM: CHEST  2 VIEW COMPARISON:  March 19, 2017. FINDINGS: Stable cardiomegaly.  Normal pulmonary vascularity. New moderate loculated right pleural effusion. Trace left pleural effusion. Bibasilar atelectasis. No pneumothorax. No acute osseous abnormality. IMPRESSION: Interval reaccumulation of moderate loculated right pleural effusion. Electronically Signed   By: Obie Dredge M.D.   On: 03/30/2017 15:54   US Thoracentesis Asp Pleural Space W/img Guide  Result Date: 04/01/2017 INDICATION: COPD, dyspnea, recurrent right pleural effusion. Request made for diagnostic and therapeutic right thoracentesis. EXAM: ULTRASOUND GUIDED DIAGNOSTIC AND THERAPEUTIC RIGHT THORACENTESIS MEDICATIONS: None. COMPLICATIONS: None immediate. PROCEDURE: An ultrasound guided thoracentesis was thoroughly discussed with the patient and questions answered. The benefits, risks, alternatives and complications were also discussed. The patient understands and wishes to proceed with the procedure. Written consent was obtained. Ultrasound was performed to localize and mark an adequate pocket of fluid in the right chest. The area was then prepped and draped in the normal sterile fashion. 1% Lidocaine was used for local anesthesia. Under ultrasound guidance a Safe-T-Centesis catheter was introduced. Thoracentesis was performed. The catheter was removed and a dressing applied. FINDINGS: A total of approximately 2.2 liters of yellow fluid was removed. Samples were sent to the laboratory as requested by the clinical team. IMPRESSION: Successful ultrasound guided diagnostic and therapeutic right thoracentesis yielding 2.2 liters of pleural fluid. Read by: Jeananne Rama, PA-C Electronically Signed   By: Jolaine Click M.D.   On: 03/31/2017  17:28    Assessment and Plan:   1. Cardiomyopathy, unknown etiology 2. Elevated troponin 3. Severe PAD 4. Recurrent right pleural effusion in setting of volume overload  LVEF is reduced. This is presumed to be an ischemic cardiomyopathy until proven otherwise. He has severe PAD  but no prior cardiac cath. He has a high probability of obstructive CAD. He is admitted with dyspnea which may be due to his volume overload and pleural effusion. He has no chest pain. Troponin is mildly elevated. I think cardiac cath is indicated. Renal function is normal. He is anemic. Would continue diuresis this weekend and plan cardiac cath next week if H/H stable. He will need a right and left heart cath.   -Will continue ASA and Plavix -Will d/c Lovenox and start IV heparin -Will continue Ace-inh.  -Will start a statin -Will start low dose Coreg -We will follow along this weekend   For questions or updates, please contact [facility] Please consult [email or web address] for contact info under Cardiology/STEMI.   Signed, Verne Carrow, MD  04/01/2017 2:09 PM

## 2017-04-01 NOTE — Progress Notes (Signed)
PT Cancellation Note  Patient Details Name: CAN LUCCI MRN: [id number] DOB: Aug 05, 1940   Cancelled Treatment:    Reason Eval/Treat Not Completed:  Attempted PT eval-pt declined to participate on today due to not feeling well. Will check back another day. )   Rebeca Alert, MPT Pager: [phone]

## 2017-04-02 ENCOUNTER — Other Ambulatory Visit: Payer: Self-pay

## 2017-04-02 DIAGNOSIS — J90 Pleural effusion, not elsewhere classified: Secondary | ICD-10-CM

## 2017-04-02 DIAGNOSIS — I5041 Acute combined systolic (congestive) and diastolic (congestive) heart failure: Secondary | ICD-10-CM

## 2017-04-02 LAB — BASIC METABOLIC PANEL
Anion gap: 8 (ref 5–15)
BUN: 19 mg/dL (ref 6–20)
CHLORIDE: 101 mmol/L (ref 101–111)
CO2: 29 mmol/L (ref 22–32)
CREATININE: 1.12 mg/dL (ref 0.61–1.24)
Calcium: 8.5 mg/dL — ABNORMAL LOW (ref 8.9–10.3)
GFR calc Af Amer: >60 mL/min (ref >60)
GFR calc non Af Amer: >60 mL/min (ref >60)
GLUCOSE: 86 mg/dL (ref 65–99)
POTASSIUM: 4.2 mmol/L (ref 3.5–5.1)
Sodium: 138 mmol/L (ref 135–145)

## 2017-04-02 LAB — CBC
HCT: 29.2 % — ABNORMAL LOW (ref 39.0–52.0)
Hemoglobin: 9.2 g/dL — ABNORMAL LOW (ref 13.0–17.0)
MCH: 21.5 pg — ABNORMAL LOW (ref 26.0–34.0)
MCHC: 31.5 g/dL (ref 30.0–36.0)
MCV: 68.2 fL — AB (ref 78.0–100.0)
PLATELETS: 403 10*3/uL — AB (ref 150–400)
RBC: 4.28 MIL/uL (ref 4.22–5.81)
RDW: 18.4 % — ABNORMAL HIGH (ref 11.5–15.5)
WBC: 8.4 10*3/uL (ref 4.0–10.5)

## 2017-04-02 LAB — HEPARIN LEVEL (UNFRACTIONATED)
Heparin Unfractionated: <0.1 IU/mL — ABNORMAL LOW (ref 0.30–0.70)
Heparin Unfractionated: <0.1 IU/mL — ABNORMAL LOW (ref 0.30–0.70)
Heparin Unfractionated: 0.11 IU/mL — ABNORMAL LOW (ref 0.30–0.70)

## 2017-04-02 LAB — TSH: TSH: 7.11 u[IU]/mL — ABNORMAL HIGH (ref 0.350–4.500)

## 2017-04-02 MED ORDER — HEPARIN BOLUS VIA INFUSION
4000.0000 [IU] | Freq: Once | INTRAVENOUS | Status: AC
  Administered 2017-04-02: 4000 [IU] via INTRAVENOUS
  Filled 2017-04-02: qty 4000

## 2017-04-02 MED ORDER — HEPARIN (PORCINE) IN NACL 100-0.45 UNIT/ML-% IJ SOLN
1550.0000 [IU]/h | INTRAMUSCULAR | Status: DC
  Administered 2017-04-03: 1550 [IU]/h via INTRAVENOUS
  Filled 2017-04-02: qty 250

## 2017-04-02 NOTE — Progress Notes (Signed)
PT Cancellation Note  Patient Details Name: Caleb Hays MRN: [id number] DOB: [birth year]   Cancelled Treatment:     Spoke with patient at beside and explained acute PT and need to move a little while here. He understands but still states that he needs to rest and does not want to do PT at this time. He wants to wait until after his procedure (cath) on Monday and we could check on him after then. Encouraged pt to sit EOB ,etc and he stated he has been. His prothesis is not up here however he stated he doesn't really use it it needs to be readjusted , so pt at more of a WC level. He did not share anymore details with me at this time.   Will check on pt Monday, call if he agrees to PT before then.    Marella Bile 04/02/2017, 5:05 PM  Marella Bile, PT Pager: [phone] 04/02/2017

## 2017-04-02 NOTE — Progress Notes (Signed)
Progress Note  Patient Name: Caleb Hays Date of Encounter: 04/02/2017   Subjective   SOB improving.   Inpatient Medications    Scheduled Meds: . allopurinol  100 mg Oral Daily  . aspirin  81 mg Oral Daily  . atorvastatin  20 mg Oral q1800  . carvedilol  3.125 mg Oral BID  . cholecalciferol  1,000 Units Oral Daily  . clopidogrel  75 mg Oral Q breakfast  . furosemide  40 mg Intravenous BID  . gabapentin  200 mg Oral QHS  . ipratropium-albuterol  3 mL Nebulization QID  . levothyroxine  12.5 mcg Oral Q0600  . lisinopril  5 mg Oral Daily  . multivitamin with minerals  1 tablet Oral Daily  . pantoprazole  40 mg Oral Daily  . potassium chloride  20 mEq Oral Daily  . saccharomyces boulardii  250 mg Oral BID  . sodium chloride flush  3 mL Intravenous Q12H  . sodium chloride flush  3 mL Intravenous Q12H  . traZODone  50 mg Oral QHS   Continuous Infusions: . sodium chloride    . heparin 1,450 Units/hr (04/02/17 0414)   PRN Meds: sodium chloride, acetaminophen **OR** acetaminophen, guaiFENesin, ipratropium-albuterol, methocarbamol, ondansetron **OR** ondansetron (ZOFRAN) IV, oxyCODONE, senna-docusate, sodium chloride flush, traMADol   Vital Signs    Vitals:   04/01/17 1836 04/01/17 2054 04/01/17 2151 04/02/17 0542  BP:   134/67 (!) 145/70  Pulse:   60 61  Resp:   18 18  Temp:   98.3 F (36.8 C) 97.7 F (36.5 C)  TempSrc:   Oral Oral  SpO2: 97% 98% 94% 97%  Weight:    154 lb 5.2 oz (70 kg)  Height:        Intake/Output Summary (Last 24 hours) at 04/02/17 0808 Last data filed at 04/02/17 0600  Gross per 24 hour  Intake           616.02 ml  Output             3825 ml  Net         -3208.98 ml   Filed Weights   03/30/17 2316 04/01/17 0500 04/02/17 0542  Weight: 176 lb 5.9 oz (80 kg) 165 lb 2 oz (74.9 kg) 154 lb 5.2 oz (70 kg)    Telemetry    NSR - Personally Reviewed  ECG     Physical Exam   GEN: No acute distress.   Neck: elevated JVD Cardiac:  RRR, 2/6 systolic murmur at apex, no , rubs, or gallops.  Respiratory: Clear to auscultation bilaterally. GI: Soft, nontender, non-distended  MS: No edema; No deformity. Neuro:  Nonfocal  Psych: Normal affect   Labs    Chemistry Recent Labs Lab 03/30/17 1622 03/31/17 0457 04/01/17 0825 04/02/17 0115  NA 139 138 137 138  K 4.5 4.3 4.5 4.2  CL 104 103 101 101  CO2 GLUCOSE 81 87 74 86  BUN CREATININE 1.17 1.08 1.03 1.12  CALCIUM 8.6* 8.4* 8.3* 8.5*  PROT 5.9*  --   --   --   ALBUMIN 3.2*  --   --   --   AST 17  --   --   --   ALT 10*  --   --   --   ALKPHOS 70  --   --   --   BILITOT 0.7  --   --   --   [facility]  59* >60 >60 >60  GFRAA >60 >60 >60 >60  ANIONGAP Hematology Recent Labs Lab 03/31/17 0457 04/01/17 0825 04/02/17 0115  WBC 6.9 6.8 8.4  RBC 4.22 3.99* 4.28  HGB 9.2* 8.9* 9.2*  HCT 29.1* 27.7* 29.2*  MCV 69.0* 69.4* 68.2*  MCH 21.8* 22.3* 21.5*  MCHC 31.6 32.1 31.5  RDW 18.3* 18.5* 18.4*  PLT 376 383 403*    Cardiac Enzymes Recent Labs Lab 03/30/17 1622 03/30/17 2350 03/31/17 0457 03/31/17 0936  TROPONINI 0.35* 0.37* 0.32* 0.29*   No results for input(s): TROPIPOC in the last 168 hours.   BNP Recent Labs Lab 03/30/17 1622  BNP 1,074.4*     DDimer No results for input(s): DDIMER in the last 168 hours.   Radiology    Dg Chest 1 View  Result Date: 03/31/2017 CLINICAL DATA:  Status post right thoracentesis. EXAM: CHEST 1 VIEW COMPARISON:  03/30/2017 FINDINGS: Since prior exam, there has been a significant decrease in right pleural fluid following thoracentesis. Residual right lung base opacity is likely combination of atelectasis and minimal residual fluid. There is no pneumothorax or evidence of a procedure complication. No other change. IMPRESSION: 1. No pneumothorax or evidence of a complication following thoracentesis. 2. Significant reduction in the right pleural effusion following thoracentesis.  Electronically Signed   By: Amie Portland M.D.   On: 03/31/2017 17:48   US Thoracentesis Asp Pleural Space W/img Guide  Result Date: 04/01/2017 INDICATION: COPD, dyspnea, recurrent right pleural effusion. Request made for diagnostic and therapeutic right thoracentesis. EXAM: ULTRASOUND GUIDED DIAGNOSTIC AND THERAPEUTIC RIGHT THORACENTESIS MEDICATIONS: None. COMPLICATIONS: None immediate. PROCEDURE: An ultrasound guided thoracentesis was thoroughly discussed with the patient and questions answered. The benefits, risks, alternatives and complications were also discussed. The patient understands and wishes to proceed with the procedure. Written consent was obtained. Ultrasound was performed to localize and mark an adequate pocket of fluid in the right chest. The area was then prepped and draped in the normal sterile fashion. 1% Lidocaine was used for local anesthesia. Under ultrasound guidance a Safe-T-Centesis catheter was introduced. Thoracentesis was performed. The catheter was removed and a dressing applied. FINDINGS: A total of approximately 2.2 liters of yellow fluid was removed. Samples were sent to the laboratory as requested by the clinical team. IMPRESSION: Successful ultrasound guided diagnostic and therapeutic right thoracentesis yielding 2.2 liters of pleural fluid. Read by: Jeananne Rama, PA-C Electronically Signed   By: Jolaine Click M.D.   On: 03/31/2017 17:28    Cardiac Studies    Patient Profile     Caleb Chelf Seitheris a 76 y.o.malewith a hx of PAD, former tobacco abuse, COPD, hypothyroidism but no known CAD who is being seen today for the evaluation of dyspnea, LV systolic dysfunctionat the request of Dr. Sunnie Nielsen.  Assessment & Plan    1. Acute combined systolic/diastolic HF - 03/2017 echo LVEF 30-35%, grade II diastolic dysfunction, very mild AS, mod MR, mild RV dysfunction, mild to mod TR, PASP 57 - negative 3.2 liters yesterday, he is on lasix 40gm IV bid. Stable renal function.  Continue IV diuresis today. He also had 2 liters removed by a thoracentesis   - suspected ICM given his strong PAD history. Mild troponin elevation fairly flat, EKG nonacute. Suspect troponin elevation likely due to chronic CAD in setting of CHF as opposed to ACS, however medically manage possible ACS over the weekend until cath Monday.   - he will require a LHC/RHC in  setting of his new onset systolic HF, plan for Monday - medical therapy with ASA 81, atorva 20, coreg 3.125 bid, plavix 75, hep gtt, lisionpril 5,    2. PAD - conitnue ASA and plavix.     For questions or updates, please contact [facility] Please consult [email or web address] for contact info under Cardiology/STEMI.      Joanie Coddington, MD  04/02/2017, 8:08 AM

## 2017-04-02 NOTE — Progress Notes (Signed)
PROGRESS NOTE    Caleb Hays  ZOX:[id number] DOB: 01-08-1941 DOA: 03/30/2017 PCP: Patient, No Pcp Per    Brief Narrative: Caleb Hays is a 76 y.o. 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 Patient was admitted to the hospital 2 weeks ago with hypoxia found to have a right-sided pleural effusion, underwent thoracentesis with 2 L removed was treated with antibiotics and completed a course of Levaquin after discharge, and felt as though he was back to his baseline briefly before the insidious development of shortness of breath over the past several days. Patient denies chest pain, denies any significant cough, and denies fevers or chills. Reports that he was slightly dyspneic a few days ago, but this has progressively worsened to the point where he has trouble catching his breath while at rest today. X-ray was performed at his nursing facility and concerning for reaccumulation of the pleural effusion on the right side.  ED Course: Upon arrival to the ED, patient is found to be afebrile, saturating adequately on 4 L/m supplemental oxygen, and with vitals otherwise stable. EKG features a sinus rhythm with low-voltage QRS and chest x-ray is notable for interval reaccumulation of moderate loculated right pleural effusion. Chemistry panel was unremarkable and CBC is notable for stable microcytic anemia with hemoglobin of 9.5 and MCV of 69.0. Troponin is elevated to 0.35. Patient was treated with 324 mg of aspirin and DuoNeb in the ED. He remained hemodynamically stable, continues to deny any chest pain, is dyspneic but not in acute distress, and will be admitted to the telemetry unit for ongoing evaluation and management of recurrent dyspnea with hypoxic respiratory  failure secondary to right-sided pleural effusion.    Assessment & Plan:   Principal Problem:   Recurrent right pleural effusion Active Problems:   COPD (chronic obstructive pulmonary disease) (HCC)   Anemia of chronic disease   Hypothyroidism   Elevated troponin   Chronic respiratory failure with hypoxia (HCC)   Dilated cardiomyopathy (HCC)   Recurrent right loculated pleural effusion; Acute hypoxic Respiratory Failure. Related to Heart Failure.  BNP elevated.  Repeat thoracentesis. cell count normal. , culture no growth to date. .  Discontinue antibiotics, pleural fluid, gram stain negative, no significant WBC. Cytology pending.   Continue with lasix.   Acute systolic and diastolic HF;  EF 35 %, diastolic dysfunction grade 2.  Continue with lasix. 40 mg IV BID Start ACE.  Cardiology consulted.  Negative 4 L.  Plan for cath on Monday.   Mild elevation troponin.  Demand.  ECHO. Ef 35 %  EKG low voltage criteria.  Continue ASA 81 and Plavix  Cardiology consulted.  Treating for ACS, heparin, aspirin. Cath on Monday   Hypothyroidism;  Continue with synthroid.  Check free T 3 and T 4. TSH elevated.    COPD  - No cough or wheeze on admission  - Continue DuoNeb and supplemental O2   Microcytic anemia  - Hgb is stable at 9.5 with no bleeding evident  - Recent anemia panel consistent with iron-deficiency    DVT prophylaxis: SCD, lovenox Code Status: Full code.  Family Communication: discussed with patient.  Disposition Plan:  To be determine  Consultants:   none   Procedures: thoracentesis   Antimicrobials:  Ceftriaxone and Azithromycin discontinue   Subjective: Denies dyspnea. No  chest pain    Objective: Vitals:   04/02/17 0542 04/02/17 0926 04/02/17 1100 04/02/17 1455  BP: (!) 145/70  138/69 128/60  Pulse: 61  60 65  Resp: 18     Temp: 97.7 F (36.5 C)   98 F (36.7 C)  TempSrc: Oral   Oral  SpO2: 97% 97%  98%  Weight: 70 kg (154 lb 5.2 oz)      Height:        Intake/Output Summary (Last 24 hours) at 04/02/17 1624 Last data filed at 04/02/17 0955  Gross per 24 hour  Intake           136.02 ml  Output             3175 ml  Net         -3038.98 ml   Filed Weights   03/30/17 2316 04/01/17 0500 04/02/17 0542  Weight: 80 kg (176 lb 5.9 oz) 74.9 kg (165 lb 2 oz) 70 kg (154 lb 5.2 oz)    Examination:  General exam: NAD Respiratory system: Bilateral crackles.  Cardiovascular system: S 1, S 2 RRR Gastrointestinal system: abdomen soft, nt Central nervous system: Alert and oriented.  Extremities: plus 1 edema, right BKA, left foot post amputation  Skin: No rashes, lesions or ulcers    Data Reviewed: I have personally reviewed following labs and imaging studies  CBC:  Recent Labs Lab 03/30/17 1622 03/31/17 0457 04/01/17 0825 04/02/17 0115  WBC 7.5 6.9 6.8 8.4  NEUTROABS 3.9  --   --   --   HGB 9.5* 9.2* 8.9* 9.2*  HCT 30.5* 29.1* 27.7* 29.2*  MCV 69.0* 69.0* 69.4* 68.2*  PLT 396 376 383 403*   Basic Metabolic Panel:  Recent Labs Lab 03/30/17 1622 03/31/17 0457 04/01/17 0825 04/02/17 0115  NA 139 138 137 138  K 4.5 4.3 4.5 4.2  CL 104 103 101 101  CO2 GLUCOSE 81 87 74 86  BUN CREATININE 1.17 1.08 1.03 1.12  CALCIUM 8.6* 8.4* 8.3* 8.5*   GFR: Estimated Creatinine Clearance: 56.4 mL/min (by C-G formula based on SCr of 1.12 mg/dL). Liver Function Tests:  Recent Labs Lab 03/30/17 1622  AST 17  ALT 10*  ALKPHOS 70  BILITOT 0.7  PROT 5.9*  ALBUMIN 3.2*   No results for input(s): LIPASE, AMYLASE in the last 168 hours. No results for input(s): AMMONIA in the last 168 hours. Coagulation Profile: No results for input(s): INR, PROTIME in the last 168 hours. Cardiac Enzymes:  Recent Labs Lab 03/30/17 1622 03/30/17 2350 03/31/17 0457 03/31/17 0936  TROPONINI 0.35* 0.37* 0.32* 0.29*   BNP (last 3 results) No results for input(s): PROBNP in the last 8760  hours. HbA1C: No results for input(s): HGBA1C in the last 72 hours. CBG: No results for input(s): GLUCAP in the last 168 hours. Lipid Profile: No results for input(s): CHOL, HDL, LDLCALC, TRIG, CHOLHDL, LDLDIRECT in the last 72 hours. Thyroid Function Tests:  Recent Labs  04/02/17 0857  TSH 7.110*   Anemia Panel: No results for input(s): VITAMINB12, FOLATE, FERRITIN, TIBC, IRON, RETICCTPCT in the last 72 hours. Sepsis Labs: No results for input(s): PROCALCITON, LATICACIDVEN in the last 168 hours.  Recent Results (from the past 240 hour(s))  MRSA PCR Screening     Status: None   Collection Time: 03/31/17  6:45 AM  Result Value Ref Range Status   MRSA by PCR NEGATIVE NEGATIVE Final  Comment:        The GeneXpert MRSA Assay (FDA approved for NASAL specimens only), is one component of a comprehensive MRSA colonization surveillance program. It is not intended to diagnose MRSA infection nor to guide or monitor treatment for MRSA infections.   Culture, body fluid-bottle     Status: None (Preliminary result)   Collection Time: 03/31/17  5:38 PM  Result Value Ref Range Status   Specimen Description PLEURAL FLUID  Final   Special Requests BOTTLES DRAWN AEROBIC AND ANAEROBIC  Final   Culture   Final    NO GROWTH 2 DAYS Performed at [facility], [address], [place], [place] [address]    Report Status PENDING  Incomplete  Gram stain     Status: None   Collection Time: 03/31/17  5:38 PM  Result Value Ref Range Status   Specimen Description PLEURAL FLUID  Final   Special Requests NONE  Final   Gram Stain   Final    CYTOSPIN SMEAR WBC PRESENT,BOTH PMN AND MONONUCLEAR NO ORGANISMS SEEN Performed at [facility], [address] [place], [place] [address]    Report Status 04/01/2017 FINAL  Final         Radiology Studies: Dg Chest 1 View  Result Date: 03/31/2017 CLINICAL DATA:  Status post right thoracentesis. EXAM: CHEST 1 VIEW COMPARISON:   03/30/2017 FINDINGS: Since prior exam, there has been a significant decrease in right pleural fluid following thoracentesis. Residual right lung base opacity is likely combination of atelectasis and minimal residual fluid. There is no pneumothorax or evidence of a procedure complication. No other change. IMPRESSION: 1. No pneumothorax or evidence of a complication following thoracentesis. 2. Significant reduction in the right pleural effusion following thoracentesis. Electronically Signed   By: Amie Portland M.D.   On: 03/31/2017 17:48   US Thoracentesis Asp Pleural Space W/img Guide  Result Date: 04/01/2017 INDICATION: COPD, dyspnea, recurrent right pleural effusion. Request made for diagnostic and therapeutic right thoracentesis. EXAM: ULTRASOUND GUIDED DIAGNOSTIC AND THERAPEUTIC RIGHT THORACENTESIS MEDICATIONS: None. COMPLICATIONS: None immediate. PROCEDURE: An ultrasound guided thoracentesis was thoroughly discussed with the patient and questions answered. The benefits, risks, alternatives and complications were also discussed. The patient understands and wishes to proceed with the procedure. Written consent was obtained. Ultrasound was performed to localize and mark an adequate pocket of fluid in the right chest. The area was then prepped and draped in the normal sterile fashion. 1% Lidocaine was used for local anesthesia. Under ultrasound guidance a Safe-T-Centesis catheter was introduced. Thoracentesis was performed. The catheter was removed and a dressing applied. FINDINGS: A total of approximately 2.2 liters of yellow fluid was removed. Samples were sent to the laboratory as requested by the clinical team. IMPRESSION: Successful ultrasound guided diagnostic and therapeutic right thoracentesis yielding 2.2 liters of pleural fluid. Read by: Jeananne Rama, PA-C Electronically Signed   By: Jolaine Click M.D.   On: 03/31/2017 17:28        Scheduled Meds: . allopurinol  100 mg Oral Daily  . aspirin  81  mg Oral Daily  . atorvastatin  20 mg Oral q1800  . carvedilol  3.125 mg Oral BID  . cholecalciferol  1,000 Units Oral Daily  . clopidogrel  75 mg Oral Q breakfast  . furosemide  40 mg Intravenous BID  . gabapentin  200 mg Oral QHS  . ipratropium-albuterol  3 mL Nebulization QID  . levothyroxine  12.5 mcg Oral Q0600  . lisinopril  5 mg Oral Daily  . multivitamin with minerals  1 tablet Oral Daily  . pantoprazole  40 mg Oral Daily  . potassium chloride  20 mEq Oral Daily  . saccharomyces boulardii  250 mg Oral BID  . sodium chloride flush  3 mL Intravenous Q12H  . sodium chloride flush  3 mL Intravenous Q12H  . traZODone  50 mg Oral QHS   Continuous Infusions: . sodium chloride    . heparin 1,450 Units/hr (04/02/17 0414)     LOS: 3 days    Time spent: 35 minutes,     Nikala Walsworth, Prentiss Bells, MD [facility] Pager [phone]  If 7PM-7AM, please contact night-coverage [email or web address] Password [facility] 04/02/2017, 4:24 PM

## 2017-04-02 NOTE — Progress Notes (Signed)
ANTICOAGULATION CONSULT NOTE  Pharmacy Consult for heparin  Indication: chest pain/ACS  No Known Allergies  Patient Measurements: Height:  (193 cm) Weight: 154 lb 5.2 oz (70 kg) IBW/kg (Calculated) : 86.8 Heparin Dosing Weight: 75kg  Vital Signs: Temp: 97.7 F (36.5 C) (09/15 0542) Temp Source: Oral (09/15 0542) BP: 138/69 (09/15 1100) Pulse Rate: 60 (09/15 1100)  Labs:  Recent Labs  03/30/17 2350 03/31/17 0457 03/31/17 0936 04/01/17 0825 04/02/17 0115  HGB  --  9.2*  --  8.9* 9.2*  HCT  --  29.1*  --  27.7* 29.2*  PLT  --  376  --  383 403*  HEPARINUNFRC  --   --   --   --  <0.10*  CREATININE  --  1.08  --  1.03 1.12  TROPONINI 0.37* 0.32* 0.29*  --   --     Estimated Creatinine Clearance: 56.4 mL/min (by C-G formula based on SCr of 1.12 mg/dL).  Assessment: 9 YOF presenting to [facility] ED 9/12 with shortness of breath d/t pleural effusion and new onset systolic CHF. He has h/o PAD (s/p R BKA). Troponin elevated, cardiology has seen patient and concerned for ACS vs chronic CAD in setting of CHF. Planning for [facility] on 9/17 but covering with IV heparin until then to r/o ACS; Pharmacy to dose.   Baseline INR, aPTT: WNL  Prior anticoagulation: ASA/Plavix PTA; last dose LMWH ppx 9/13 at 2200  Significant events:  Thoracentesis 9/13  Today, 04/02/2017  CBC: Hgb decreased but relatively stable, pltc WNL  Renal: SCr WNL  Most recent heparin level remains undetectable despite a nearly 60% increase in rate. RN reports that IV site does not appear to be puffy or leaking, but does look red, suggesting possible extravasation  No bleeding issues per RN  Goal of Therapy: Heparin level 0.3-0.7 units/ml Monitor platelets by anticoagulation protocol: Yes  Plan:  RN to place new IV line  Will bolus patient to correct undetectable level, but d/t concern for possible infiltration, will not increase heparin rate at this time  Heparin 4000 units IV bolus x  1  Continue heparin at 1450 units/hr  Check heparin level 6 hrs after rate change; will not have reached steady state, but want to ensure patient is not being grossly over- or under-dosed.  Daily CBC, daily heparin level once stable  Monitor for signs of bleeding or thrombosis   Bernadene Person, PharmD Pager: [phone] 04/02/2017, 1:33 PM

## 2017-04-02 NOTE — Progress Notes (Signed)
ANTICOAGULATION CONSULT NOTE - Follow Up Consult  Pharmacy Consult for Heparin Indication: chest pain/ACS  No Known Allergies  Patient Measurements: Height:  (193 cm) Weight: 154 lb 5.2 oz (70 kg) IBW/kg (Calculated) : 86.8 Heparin Dosing Weight:   Vital Signs: Temp: 97.7 F (36.5 C) (09/15 0542) Temp Source: Oral (09/15 0542) BP: 145/70 (09/15 0542) Pulse Rate: 61 (09/15 0542)  Labs:  Recent Labs  03/30/17 2350 03/31/17 0457 03/31/17 0936 04/01/17 0825 04/02/17 0115  HGB  --  9.2*  --  8.9* 9.2*  HCT  --  29.1*  --  27.7* 29.2*  PLT  --  376  --  383 403*  HEPARINUNFRC  --   --   --   --  <0.10*  CREATININE  --  1.08  --  1.03 1.12  TROPONINI 0.37* 0.32* 0.29*  --   --     Estimated Creatinine Clearance: 56.4 mL/min (by C-G formula based on SCr of 1.12 mg/dL).   Medications:  Infusions:  . sodium chloride    . heparin 1,450 Units/hr (04/02/17 0414)    Assessment: Patient with low heparin level.  No heparin issues per RN.    Goal of Therapy:  Heparin level 0.3-0.7 units/ml Monitor platelets by anticoagulation protocol: Yes   Plan:  Increase heparin to 1450 units/hr Recheck level at [address] [place] 04/02/2017,6:09 AM

## 2017-04-02 NOTE — Progress Notes (Signed)
ANTICOAGULATION CONSULT NOTE - Follow Up Consult  Pharmacy Consult for Heparin Indication: chest pain/ACS  No Known Allergies  Patient Measurements: Height:  (193 cm) Weight: 154 lb 5.2 oz (70 kg) IBW/kg (Calculated) : 86.8 Heparin Dosing Weight:   Vital Signs: Temp: 98 F (36.7 C) (09/15 1455) Temp Source: Oral (09/15 1455) BP: 128/60 (09/15 1455) Pulse Rate: 65 (09/15 1455)  Labs:  Recent Labs  03/30/17 2350  03/31/17 0457 03/31/17 0936 04/01/17 0825 04/02/17 0115 04/02/17 1234 04/02/17 2040  HGB  --   < > 9.2*  --  8.9* 9.2*  --   --   HCT  --   --  29.1*  --  27.7* 29.2*  --   --   PLT  --   --  376  --  383 403*  --   --   HEPARINUNFRC  --   --   --   --   --  <0.10* <0.10* 0.11*  CREATININE  --   --  1.08  --  1.03 1.12  --   --   TROPONINI 0.37*  --  0.32* 0.29*  --   --   --   --   < > = values in this interval not displayed.  Estimated Creatinine Clearance: 56.4 mL/min (by C-G formula based on SCr of 1.12 mg/dL).   Medications:  Infusions:  . sodium chloride    . heparin 1,550 Units/hr (04/02/17 2148)    Assessment: Patient with low heparin level.  RN also notes bleeding at IV site.  See prior 9/15 at 2125 note.  Dr. Antionette Char okay with following protocol at this time and incresasing drip rate.  RN to continue to monitor for bleeding at IV site.  Goal of Therapy:  Heparin level 0.3-0.7 units/ml Monitor platelets by anticoagulation protocol: Yes   Plan:  Increase heparin to 1550 units/hr Recheck level at 0600  Darlina Guys, Jacquenette Shone [place] 04/02/2017,10:49 PM

## 2017-04-02 NOTE — NC FL2 (Signed)
[place] MEDICAID FL2 LEVEL OF CARE SCREENING TOOL     IDENTIFICATION  Patient Name: Caleb Hays Birthdate: 1941/06/30 Sex: male Admission Date (Current Location): 03/30/2017  [place] and [place] Number:  Caleb Hays [id number] Q Facility and Address:  [facility],  [address], [place] [address]      Provider Number: [id number]  Attending Physician Name and Address:  Alba Cory, MD  Relative Name and Phone Number:       Current Level of Care: Hospital Recommended Level of Care: Skilled Nursing Facility Prior Approval Number:    Date Approved/Denied:   PASRR Number: [id number]  Discharge Plan: SNF    Current Diagnoses: Patient Active Problem List   Diagnosis Date Noted  . Dilated cardiomyopathy (HCC)   . Elevated troponin 03/30/2017  . Chronic respiratory failure with hypoxia (HCC) 03/30/2017  . HCAP (healthcare-associated pneumonia) 03/17/2017  . Recurrent right pleural effusion 03/17/2017  . Chronic pain 03/17/2017  . S/P transmetatarsal amputation of foot, left (HCC) 10/08/2016  . PAD (peripheral artery disease) (HCC) 09/15/2016  . Edema 05/05/2016  . Phantom pain (HCC) 03/08/2016  . Gout 03/08/2016  . Hypothyroidism 03/08/2016  . CKD (chronic kidney disease) stage 3, GFR 30-59 ml/min 03/08/2016  . GERD without esophagitis 03/08/2016  . Constipation 12/29/2015  . Below knee amputation status (HCC) 10/01/2015  . Ankle pain 08/13/2015  . Adult failure to thrive   . PVD (peripheral vascular disease) (HCC)   . Elevated blood uric acid level 08/10/2015  . 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 Anemia of chronic disease 08/10/2015  . Malnutrition of moderate degree 06/24/2015  . Failure to thrive in adult 06/23/2015  . Weight loss 06/23/2015  . Tobacco abuse 06/23/2015  . COPD (chronic obstructive pulmonary disease) (HCC) 06/23/2015  . Fall   . Critical lower limb ischemia 05/31/2014  . Status post right foot surgery 03/12/2014     Orientation RESPIRATION BLADDER Height & Weight     Self, Time, Situation  O2 (2L/min) Continent Weight: 154 lb 5.2 oz (70 kg) Height:   (193 cm)  BEHAVIORAL SYMPTOMS/MOOD NEUROLOGICAL BOWEL NUTRITION STATUS      Continent Diet (Heart Healthy)  AMBULATORY STATUS COMMUNICATION OF NEEDS Skin   Limited Assist Verbally (S)  (abrasion left leg,)                       Personal Care Assistance Level of Assistance  Bathing, Feeding, Dressing Bathing Assistance: Limited assistance Feeding assistance: Limited assistance Dressing Assistance: Limited assistance     Functional Limitations Info             SPECIAL CARE FACTORS FREQUENCY  PT (By licensed PT)     PT Frequency: 5              Contractures Contractures Info: Not present    Additional Factors Info  Code Status, Allergies Code Status Info: Full Allergies Info: NKA Psychotropic Info: Trazadone   Isolation Precautions Info: 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 0.9 %  sodium chloride infusion  250 mL Intravenous PRN Opyd, Lavone Neri, MD      . acetaminophen (TYLENOL) tablet 650 mg  650 mg Oral Q6H PRN Opyd, Lavone Neri, MD       Or  . acetaminophen (TYLENOL) suppository 650 mg  650  mg Rectal Q6H PRN Opyd, Lavone Neri, MD      . allopurinol (ZYLOPRIM) tablet 100 mg  100 mg Oral Daily Opyd, Lavone Neri, MD   100 mg at 04/02/17 1100  . aspirin chewable tablet 81 mg  81 mg Oral Daily Opyd, Lavone Neri, MD   81 mg at 04/02/17 1100  . atorvastatin (LIPITOR) tablet 20 mg  20 mg Oral q1800 Kathleene Hazel, MD   20 mg at 04/02/17 1756  . carvedilol (COREG) tablet 3.125 mg  3.125 mg Oral BID Kathleene Hazel, MD   3.125 mg at 04/02/17 1100  . cholecalciferol (VITAMIN D) tablet 1,000 Units  1,000 Units Oral Daily Opyd, Lavone Neri,  MD   1,000 Units at 04/02/17 1100  . clopidogrel (PLAVIX) tablet 75 mg  75 mg Oral Q breakfast Opyd, Lavone Neri, MD   75 mg at 04/02/17 0900  . furosemide (LASIX) injection 40 mg  40 mg Intravenous BID Regalado, Belkys A, MD   40 mg at 04/02/17 1756  . gabapentin (NEURONTIN) capsule 200 mg  200 mg Oral QHS Opyd, Lavone Neri, MD   200 mg at 04/01/17 2107  . guaiFENesin (MUCINEX) 12 hr tablet 1,200 mg  1,200 mg Oral BID PRN Opyd, Lavone Neri, MD      . heparin ADULT infusion 100 units/mL (25000 units/228mL sodium chloride 0.45%)  1,450 Units/hr Intravenous Continuous Regalado, Belkys A, MD 14.5 mL/hr at 04/02/17 0414 1,450 Units/hr at 04/02/17 0414  . ipratropium-albuterol (DUONEB) 0.5-2.5 (3) MG/3ML nebulizer solution 3 mL  3 mL Nebulization QID PRN Opyd, Lavone Neri, MD   3 mL at 04/01/17 1835  . ipratropium-albuterol (DUONEB) 0.5-2.5 (3) MG/3ML nebulizer solution 3 mL  3 mL Nebulization QID Opyd, Lavone Neri, MD   3 mL at 04/02/17 1922  . levothyroxine (SYNTHROID, LEVOTHROID) tablet 12.5 mcg  12.5 mcg Oral Q0600 Briscoe Deutscher, MD   12.5 mcg at 04/02/17 0607  . lisinopril (PRINIVIL,ZESTRIL) tablet 5 mg  5 mg Oral Daily Regalado, Belkys A, MD   5 mg at 04/02/17 1100  . methocarbamol (ROBAXIN) tablet 500 mg  500 mg Oral Q6H PRN Opyd, Lavone Neri, MD      . multivitamin with minerals tablet 1 tablet  1 tablet Oral Daily Opyd, Lavone Neri, MD   1 tablet at 04/02/17 1100  . ondansetron (ZOFRAN) tablet 4 mg  4 mg Oral Q6H PRN Opyd, Lavone Neri, MD       Or  . ondansetron (ZOFRAN) injection 4 mg  4 mg Intravenous Q6H PRN Opyd, Lavone Neri, MD      . oxyCODONE (Oxy IR/ROXICODONE) immediate release tablet 5-10 mg  5-10 mg Oral Q4H PRN Opyd, Lavone Neri, MD      . pantoprazole (PROTONIX) EC tablet 40 mg  40 mg Oral Daily Opyd, Lavone Neri, MD   40 mg at 04/02/17 1100  . potassium chloride SA (K-DUR,KLOR-CON) CR tablet 20 mEq  20 mEq Oral Daily Regalado, Belkys A, MD   20 mEq at 04/02/17 1100  . saccharomyces boulardii  (FLORASTOR) capsule 250 mg  250 mg Oral BID Opyd, Lavone Neri, MD   250 mg at 04/02/17 1100  . senna-docusate (Senokot-S) tablet 1 tablet  1 tablet Oral QHS PRN Opyd, Lavone Neri, MD      . sodium chloride flush (NS) 0.9 % injection 3 mL  3 mL Intravenous Q12H Opyd, Lavone Neri, MD   3 mL at 03/31/17 0117  . sodium chloride flush (NS) 0.9 %  injection 3 mL  3 mL Intravenous Q12H Opyd, Lavone Neri, MD   3 mL at 04/02/17 1000  . sodium chloride flush (NS) 0.9 % injection 3 mL  3 mL Intravenous PRN Opyd, Lavone Neri, MD      . traMADol (ULTRAM) tablet 50 mg  50 mg Oral Q6H PRN Opyd, Lavone Neri, MD      . traZODone (DESYREL) tablet 50 mg  50 mg Oral QHS Opyd, Lavone Neri, MD   50 mg at 04/01/17 2107     Discharge Medications: Please see discharge summary for a list of discharge medications.  Relevant Imaging Results:  Relevant Lab Results:   Additional Information SSN [id number] [place], [place]

## 2017-04-02 NOTE — Progress Notes (Signed)
At report day rn stated she had just changed iv site.  On first assessment the old IV site dressing was saturated.  Heparin was on and remains 14.5.  Site cleansed and redressed.  Pt education given pt verbalized understanding.  Dressing rechecked in a few minutes afterward and was D& I.  Pt stated he would continue to monitor and contact me if dressing showed signs of bleeding.  At 2100 in to see pt and that dressing was noted to be saturated again.  Pressure applied for a few mins and no bleeding noted.   Then pressure dressing applied.  JC RPH in pharmacy notified. Stated to continue heparin at 14.5 as he last level at 2100 came back low and that he would contact MD.  Will continue to monitor.  Education reinforced with pt who again verbalizes understanding

## 2017-04-03 LAB — CBC
HCT: 30.3 % — ABNORMAL LOW (ref 39.0–52.0)
HEMOGLOBIN: 9.7 g/dL — AB (ref 13.0–17.0)
MCH: 21.7 pg — ABNORMAL LOW (ref 26.0–34.0)
MCHC: 32 g/dL (ref 30.0–36.0)
MCV: 67.8 fL — ABNORMAL LOW (ref 78.0–100.0)
PLATELETS: 452 10*3/uL — AB (ref 150–400)
RBC: 4.47 MIL/uL (ref 4.22–5.81)
RDW: 18.1 % — ABNORMAL HIGH (ref 11.5–15.5)
WBC: 9.5 10*3/uL (ref 4.0–10.5)

## 2017-04-03 LAB — T4, FREE: FREE T4: 0.8 ng/dL (ref 0.61–1.12)

## 2017-04-03 LAB — LIPID PANEL
Cholesterol: 116 mg/dL (ref 0–200)
HDL: 20 mg/dL — AB (ref >40)
LDL CALC: 67 mg/dL (ref 0–99)
TRIGLYCERIDES: 145 mg/dL (ref <150)
Total CHOL/HDL Ratio: 5.8 RATIO
VLDL: 29 mg/dL (ref 0–40)

## 2017-04-03 LAB — HEMOGLOBIN AND HEMATOCRIT, BLOOD
HCT: 31.1 % — ABNORMAL LOW (ref 39.0–52.0)
Hemoglobin: 9.9 g/dL — ABNORMAL LOW (ref 13.0–17.0)

## 2017-04-03 LAB — BASIC METABOLIC PANEL
ANION GAP: 9 (ref 5–15)
BUN: 18 mg/dL (ref 6–20)
CALCIUM: 8.4 mg/dL — AB (ref 8.9–10.3)
CO2: 29 mmol/L (ref 22–32)
Chloride: 99 mmol/L — ABNORMAL LOW (ref 101–111)
Creatinine, Ser: 1.08 mg/dL (ref 0.61–1.24)
GFR calc Af Amer: >60 mL/min (ref >60)
GFR calc non Af Amer: >60 mL/min (ref >60)
GLUCOSE: 74 mg/dL (ref 65–99)
POTASSIUM: 4.2 mmol/L (ref 3.5–5.1)
SODIUM: 137 mmol/L (ref 135–145)

## 2017-04-03 LAB — HEPARIN LEVEL (UNFRACTIONATED)
HEPARIN UNFRACTIONATED: 0.12 [IU]/mL — AB (ref 0.30–0.70)
Heparin Unfractionated: 0.14 IU/mL — ABNORMAL LOW (ref 0.30–0.70)

## 2017-04-03 LAB — PROTIME-INR
INR: 1.16
Prothrombin Time: 14.7 seconds (ref 11.4–15.2)

## 2017-04-03 MED ORDER — SODIUM CHLORIDE 0.9% FLUSH
3.0000 mL | Freq: Two times a day (BID) | INTRAVENOUS | Status: DC
  Administered 2017-04-04: 3 mL via INTRAVENOUS

## 2017-04-03 MED ORDER — SODIUM CHLORIDE 0.9 % IV SOLN: 250.0000 mL | INTRAVENOUS | Status: DC | PRN

## 2017-04-03 MED ORDER — HEPARIN (PORCINE) IN NACL 100-0.45 UNIT/ML-% IJ SOLN
1800.0000 [IU]/h | INTRAMUSCULAR | Status: DC
  Administered 2017-04-03 – 2017-04-04 (×2): 1500 [IU]/h via INTRAVENOUS
  Administered 2017-04-05: 1800 [IU]/h via INTRAVENOUS
  Filled 2017-04-03 (×3): qty 250

## 2017-04-03 MED ORDER — ASPIRIN 81 MG PO CHEW
81.0000 mg | CHEWABLE_TABLET | ORAL | Status: AC
  Administered 2017-04-04: 81 mg via ORAL
  Filled 2017-04-03: qty 1

## 2017-04-03 MED ORDER — SODIUM CHLORIDE 0.9 % IV SOLN
INTRAVENOUS | Status: DC
  Administered 2017-04-04: 06:00:00 via INTRAVENOUS

## 2017-04-03 MED ORDER — SODIUM CHLORIDE 0.9% FLUSH: 3.0000 mL | INTRAVENOUS | Status: DC | PRN

## 2017-04-03 MED ORDER — HEPARIN (PORCINE) IN NACL 100-0.45 UNIT/ML-% IJ SOLN: 1700.0000 [IU]/h | INTRAMUSCULAR | Status: DC

## 2017-04-03 NOTE — Progress Notes (Addendum)
patient's dressing clean, dry & intact but is now bleeding from new IV site enough to saturate patient armband. MD, Dr. Wyline Mood (PA - [place]), Notified. Communicated that heparin is subtherapeutic per pharmacy and will not be advanced d/t active bleeding.  Earnest Conroy. Clelia Croft, RN

## 2017-04-03 NOTE — Progress Notes (Signed)
PROGRESS NOTE    Caleb Hays  ZOX:[id number] DOB: 1941/02/19 DOA: 03/30/2017 PCP: Patient, No Pcp Per    Brief Narrative: Caleb Hays is a 76 y.o. 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 Patient was admitted to the hospital 2 weeks ago with hypoxia found to have a right-sided pleural effusion, underwent thoracentesis with 2 L removed was treated with antibiotics and completed a course of Levaquin after discharge, and felt as though he was back to his baseline briefly before the insidious development of shortness of breath over the past several days. Patient denies chest pain, denies any significant cough, and denies fevers or chills. Reports that he was slightly dyspneic a few days ago, but this has progressively worsened to the point where he has trouble catching his breath while at rest today. X-ray was performed at his nursing facility and concerning for reaccumulation of the pleural effusion on the right side.  ED Course: Upon arrival to the ED, patient is found to be afebrile, saturating adequately on 4 L/m supplemental oxygen, and with vitals otherwise stable. EKG features a sinus rhythm with low-voltage QRS and chest x-ray is notable for interval reaccumulation of moderate loculated right pleural effusion. Chemistry panel was unremarkable and CBC is notable for stable microcytic anemia with hemoglobin of 9.5 and MCV of 69.0. Troponin is elevated to 0.35. Patient was treated with 324 mg of aspirin and DuoNeb in the ED. He remained hemodynamically stable, continues to deny any chest pain, is dyspneic but not in acute distress, and will be admitted to the telemetry unit for ongoing evaluation and management of recurrent dyspnea with hypoxic respiratory  failure secondary to right-sided pleural effusion.    Assessment & Plan:   Principal Problem:   Recurrent right pleural effusion Active Problems:   COPD (chronic obstructive pulmonary disease) (HCC)   Anemia of chronic disease   Hypothyroidism   Elevated troponin   Chronic respiratory failure with hypoxia (HCC)   Dilated cardiomyopathy (HCC)   Recurrent right loculated pleural effusion; Acute hypoxic Respiratory Failure. Related to Heart Failure.  BNP elevated.  Repeat thoracentesis. cell count normal. , culture no growth to date. .  Discontinue antibiotics, pleural fluid, gram stain negative, no significant WBC. Cytology pending.   Continue with lasix.   Acute systolic and diastolic HF;  EF 35 %, diastolic dysfunction grade 2.  Continue with lasix. 40 mg IV BID Started ACE. BB Cardiology consulted.  Negative 6 L.  Plan for cath on Monday.   Mild elevation troponin.  Demand.  ECHO. Ef 35 %  EKG low voltage criteria.  Continue ASA 81 and Plavix  Cardiology consulted.  Treating for ACS, heparin, aspirin. Cath on Monday   Hypothyroidism;  Continue with synthroid.  Check free T 3 and T 4. TSH elevated.    COPD  - No cough or wheeze on admission  - Continue DuoNeb and supplemental O2   Microcytic anemia  - Hgb is stable at 9.5 with no bleeding evident  - Recent anemia panel consistent with iron-deficiency    DVT prophylaxis: SCD, lovenox Code Status: Full code.  Family Communication: discussed with patient.  Disposition Plan:  To be determine  Consultants:   none   Procedures: thoracentesis   Antimicrobials:  Ceftriaxone and Azithromycin discontinue   Subjective: He is tired  today, denies dyspnea or chest pain    Objective: Vitals:   04/02/17 1922 04/02/17 2306 04/03/17 0500 04/03/17 0748  BP:  (!) 141/74 (!) 142/80   Pulse:  64 64   Resp:  16 18   Temp:  97.9 F (36.6 C) 98.4 F (36.9 C)   TempSrc:  Oral Oral   SpO2: 95% 100% 100% 95%    Weight:   70.1 kg (154 lb 8.7 oz)   Height:        Intake/Output Summary (Last 24 hours) at 04/03/17 1418 Last data filed at 04/03/17 0658  Gross per 24 hour  Intake           1403.6 ml  Output             3460 ml  Net          -2056.4 ml   Filed Weights   04/01/17 0500 04/02/17 0542 04/03/17 0500  Weight: 74.9 kg (165 lb 2 oz) 70 kg (154 lb 5.2 oz) 70.1 kg (154 lb 8.7 oz)    Examination:  General exam: NAD Respiratory system: Bilateral crackles.  Cardiovascular system; S 1, S 2 RRR Gastrointestinal system: Abdomen is soft, nt, nd Central nervous system: alert and oriented.  Extremities: plus 1 edema, right BKA, left foot post amputation  Skin: No rashes, lesions or ulcers    Data Reviewed: I have personally reviewed following labs and imaging studies  CBC:  Recent Labs Lab 03/30/17 1622 03/31/17 0457 04/01/17 0825 04/02/17 0115 04/03/17 0031 04/03/17 0548  WBC 7.5 6.9 6.8 8.4  --  9.5  NEUTROABS 3.9  --   --   --   --   --   HGB 9.5* 9.2* 8.9* 9.2* 9.9* 9.7*  HCT 30.5* 29.1* 27.7* 29.2* 31.1* 30.3*  MCV 69.0* 69.0* 69.4* 68.2*  --  67.8*  PLT 396 376 383 403*  --  452*   Basic Metabolic Panel:  Recent Labs Lab 03/30/17 1622 03/31/17 0457 04/01/17 0825 04/02/17 0115 04/03/17 0548  NA 139 138 137 138 137  K 4.5 4.3 4.5 4.2 4.2  CL 104 103 101 101 99*  CO2 GLUCOSE 81 87 74 86 74  BUN CREATININE 1.17 1.08 1.03 1.12 1.08  CALCIUM 8.6* 8.4* 8.3* 8.5* 8.4*   GFR: Estimated Creatinine Clearance: 58.6 mL/min (by C-G formula based on SCr of 1.08 mg/dL). Liver Function Tests:  Recent Labs Lab 03/30/17 1622  AST 17  ALT 10*  ALKPHOS 70  BILITOT 0.7  PROT 5.9*  ALBUMIN 3.2*   No results for input(s): LIPASE, AMYLASE in the last 168 hours. No results for input(s): AMMONIA in the last 168 hours. Coagulation Profile: No results for input(s): INR, PROTIME in the last 168 hours. Cardiac Enzymes:  Recent Labs Lab  03/30/17 1622 03/30/17 2350 03/31/17 0457 03/31/17 0936  TROPONINI 0.35* 0.37* 0.32* 0.29*   BNP (last 3 results) No results for input(s): PROBNP in the last 8760 hours. HbA1C: No results for input(s): HGBA1C in the last 72 hours. CBG: No results for input(s): GLUCAP in the last 168 hours. Lipid Profile:  Recent Labs  04/03/17 0548  CHOL 116  HDL 20*  LDLCALC 67  TRIG 161  CHOLHDL 5.8   Thyroid Function Tests:  Recent Labs  04/02/17 0857 04/03/17 0548  TSH 7.110*  --   FREET4  --  0.80   Anemia Panel: No results for input(s): VITAMINB12, FOLATE,  FERRITIN, TIBC, IRON, RETICCTPCT in the last 72 hours. Sepsis Labs: No results for input(s): PROCALCITON, LATICACIDVEN in the last 168 hours.  Recent Results (from the past 240 hour(s))  MRSA PCR Screening     Status: None   Collection Time: 03/31/17  6:45 AM  Result Value Ref Range Status   MRSA by PCR NEGATIVE NEGATIVE Final    Comment:        The GeneXpert MRSA Assay (FDA approved for NASAL specimens only), is one component of a comprehensive MRSA colonization surveillance program. It is not intended to diagnose MRSA infection nor to guide or monitor treatment for MRSA infections.   Culture, body fluid-bottle     Status: None (Preliminary result)   Collection Time: 03/31/17  5:38 PM  Result Value Ref Range Status   Specimen Description PLEURAL FLUID  Final   Special Requests BOTTLES DRAWN AEROBIC AND ANAEROBIC  Final   Culture   Final    NO GROWTH 3 DAYS Performed at [facility], [address] [place], [place] [address]    Report Status PENDING  Incomplete  Gram stain     Status: None   Collection Time: 03/31/17  5:38 PM  Result Value Ref Range Status   Specimen Description PLEURAL FLUID  Final   Special Requests NONE  Final   Gram Stain   Final    CYTOSPIN SMEAR WBC PRESENT,BOTH PMN AND MONONUCLEAR NO ORGANISMS SEEN Performed at [facility], [address], [place], [place]  [address]    Report Status 04/01/2017 FINAL  Final         Radiology Studies: No results found.      Scheduled Meds: . allopurinol  100 mg Oral Daily  . aspirin  81 mg Oral Daily  . atorvastatin  20 mg Oral q1800  . carvedilol  3.125 mg Oral BID  . cholecalciferol  1,000 Units Oral Daily  . clopidogrel  75 mg Oral Q breakfast  . furosemide  40 mg Intravenous BID  . gabapentin  200 mg Oral QHS  . ipratropium-albuterol  3 mL Nebulization QID  . levothyroxine  12.5 mcg Oral Q0600  . lisinopril  5 mg Oral Daily  . multivitamin with minerals  1 tablet Oral Daily  . pantoprazole  40 mg Oral Daily  . potassium chloride  20 mEq Oral Daily  . saccharomyces boulardii  250 mg Oral BID  . sodium chloride flush  3 mL Intravenous Q12H  . sodium chloride flush  3 mL Intravenous Q12H  . traZODone  50 mg Oral QHS   Continuous Infusions: . sodium chloride    . heparin 1,550 Units/hr (04/03/17 0313)  . heparin 1,700 Units/hr (04/03/17 0644)     LOS: 4 days    Time spent: 35 minutes,     Julianah Marciel, Prentiss Bells, MD [facility] Pager [phone]  If 7PM-7AM, please contact night-coverage [email or web address] Password [email or web address] 04/03/2017, 2:18 PM

## 2017-04-03 NOTE — Progress Notes (Signed)
ANTICOAGULATION CONSULT NOTE  Pharmacy Consult for heparin  Indication: chest pain/ACS  No Known Allergies  Patient Measurements: Height:  (193 cm) Weight: 154 lb 8.7 oz (70.1 kg) IBW/kg (Calculated) : 86.8 Heparin Dosing Weight: 75kg  Vital Signs: Temp: 98.1 F (36.7 C) (09/16 1531) Temp Source: Oral (09/16 1531) BP: 120/61 (09/16 1531) Pulse Rate: 62 (09/16 1531)  Labs:  Recent Labs  04/01/17 0825 04/02/17 0115  04/02/17 2040 04/03/17 0031 04/03/17 0548 04/03/17 1438  HGB 8.9* 9.2*  --   --  9.9* 9.7*  --   HCT 27.7* 29.2*  --   --  31.1* 30.3*  --   PLT 383 403*  --   --   --  452*  --   HEPARINUNFRC  --  <0.10*  < > 0.11*  --  0.12* 0.14*  CREATININE 1.03 1.12  --   --   --  1.08  --   < > = values in this interval not displayed.  Estimated Creatinine Clearance: 58.6 mL/min (by C-G formula based on SCr of 1.08 mg/dL).  Assessment: Caleb Hays presenting to [facility] ED 9/12 with shortness of breath d/t pleural effusion and new onset systolic CHF. He has h/o PAD (s/p R BKA). Troponin elevated, cardiology has seen patient and concerned for ACS vs chronic CAD in setting of CHF. Planning for [facility] on 9/17 but covering with IV heparin until then to r/o ACS; Pharmacy to dose.   Baseline INR, aPTT: not done  Prior anticoagulation: ASA/Plavix PTA; last dose LMWH ppx 9/13 at 2200  Significant events:  Thoracentesis 9/13  Patient with bleeding at IV site starting 9/15 PM and continuing through 9/16. Cardiology notified 9/16 at 1630 and heparin held x6 hours, to resume per pharmacy afterward  Today, 04/03/2017  CBC: Hgb decreased but relatively stable, pltc WNL  Renal: SCr WNL  Most recent heparin level with minimal change despite several rate increases  Cards PA notified of continued bleeding at IV site and holding heparin  Goal of Therapy: Heparin level 0.3-0.7 units/ml Monitor platelets by anticoagulation protocol: Yes  Plan:  Heparin currently on hold, will  resume in 6 hr at 1500 units/hr (below previous rate but patient is nowhere near therapeutic and rate limited by bleeding)  Will continue to check daily heparin levels, but it appears that heparin levels may not correlate well with bleeding risk in this patient. Cardiology is aware that heparin is subtherapeutic and will not be advanced by pharmacy while patient continues to bleed; checking INR and aPTT (not done before starting heparin) to screen for underlying coagulopathy.  Daily CBC level  Monitor for resolution of bleeding or signs of thrombosis   Bernadene Person, PharmD Pager: [phone] 04/03/2017, 4:25 PM

## 2017-04-03 NOTE — Progress Notes (Signed)
Progress Note  Patient Name: Caleb Hays Date of Encounter: 04/03/2017   Subjective   No SOB, no chest pain  Inpatient Medications    Scheduled Meds: . allopurinol  100 mg Oral Daily  . aspirin  81 mg Oral Daily  . atorvastatin  20 mg Oral q1800  . carvedilol  3.125 mg Oral BID  . cholecalciferol  1,000 Units Oral Daily  . clopidogrel  75 mg Oral Q breakfast  . furosemide  40 mg Intravenous BID  . gabapentin  200 mg Oral QHS  . ipratropium-albuterol  3 mL Nebulization QID  . levothyroxine  12.5 mcg Oral Q0600  . lisinopril  5 mg Oral Daily  . multivitamin with minerals  1 tablet Oral Daily  . pantoprazole  40 mg Oral Daily  . potassium chloride  20 mEq Oral Daily  . saccharomyces boulardii  250 mg Oral BID  . sodium chloride flush  3 mL Intravenous Q12H  . sodium chloride flush  3 mL Intravenous Q12H  . traZODone  50 mg Oral QHS   Continuous Infusions: . sodium chloride    . heparin 1,550 Units/hr (04/03/17 0313)  . heparin 1,700 Units/hr (04/03/17 0644)   PRN Meds: sodium chloride, acetaminophen **OR** acetaminophen, guaiFENesin, ipratropium-albuterol, methocarbamol, ondansetron **OR** ondansetron (ZOFRAN) IV, oxyCODONE, senna-docusate, sodium chloride flush, traMADol   Vital Signs    Vitals:   04/02/17 1922 04/02/17 2306 04/03/17 0500 04/03/17 0748  BP:  (!) 141/74 (!) 142/80   Pulse:  64 64   Resp:  16 18   Temp:  97.9 F (36.6 C) 98.4 F (36.9 C)   TempSrc:  Oral Oral   SpO2: 95% 100% 100% 95%  Weight:   154 lb 8.7 oz (70.1 kg)   Height:        Intake/Output Summary (Last 24 hours) at 04/03/17 0814 Last data filed at 04/03/17 0658  Gross per 24 hour  Intake           1519.6 ml  Output             4235 ml  Net          -2715.4 ml   Filed Weights   04/01/17 0500 04/02/17 0542 04/03/17 0500  Weight: 165 lb 2 oz (74.9 kg) 154 lb 5.2 oz (70 kg) 154 lb 8.7 oz (70.1 kg)    Telemetry    SR - Personally Reviewed  ECG    n/a  Physical Exam    GEN: No acute distress.   Neck: elevated JVD Cardiac: RRR, no murmurs, rubs, or gallops.  Respiratory: Clear to auscultation bilaterally. GI: Soft, nontender, non-distended  MS: No edema; No deformity. Neuro:  Nonfocal  Psych: Normal affect   Labs    Chemistry Recent Labs Lab 03/30/17 1622  04/01/17 0825 04/02/17 0115 04/03/17 0548  NA 139  < > 137 138 137  K 4.5  < > 4.5 4.2 4.2  CL 104  < > 101 101 99*  CO2 27  < > GLUCOSE 81  < > 74 86 74  BUN 20  < > CREATININE 1.17  < > 1.03 1.12 1.08  CALCIUM 8.6*  < > 8.3* 8.5* 8.4*  PROT 5.9*  --   --   --   --   ALBUMIN 3.2*  --   --   --   --   AST 17  --   --   --   --  ALT 10*  --   --   --   --   ALKPHOS 70  --   --   --   --   BILITOT 0.7  --   --   --   --   GFRNONAA 59*  < > >60 >60 >60  GFRAA >60  < > >60 >60 >60  ANIONGAP 8  < > < > = values in this interval not displayed.   Hematology Recent Labs Lab 04/01/17 0825 04/02/17 0115 04/03/17 0031 04/03/17 0548  WBC 6.8 8.4  --  9.5  RBC 3.99* 4.28  --  4.47  HGB 8.9* 9.2* 9.9* 9.7*  HCT 27.7* 29.2* 31.1* 30.3*  MCV 69.4* 68.2*  --  67.8*  MCH 22.3* 21.5*  --  21.7*  MCHC 32.1 31.5  --  32.0  RDW 18.5* 18.4*  --  18.1*  PLT 383 403*  --  452*    Cardiac Enzymes Recent Labs Lab 03/30/17 1622 03/30/17 2350 03/31/17 0457 03/31/17 0936  TROPONINI 0.35* 0.37* 0.32* 0.29*   No results for input(s): TROPIPOC in the last 168 hours.   BNP Recent Labs Lab 03/30/17 1622  BNP 1,074.4*     DDimer No results for input(s): DDIMER in the last 168 hours.   Radiology    No results found.  Cardiac Studies     Patient Profile     Aaran Enberg Seitheris a 76 y.o.malewith a hx of PAD, former tobacco abuse, COPD, hypothyroidism but no known CAD who is being seen today for the evaluation of dyspnea, LV systolic dysfunctionat the request of Dr. Sunnie Nielsen.  Assessment & Plan    1. Acute combined systolic/diastolic HF - 03/2017  echo LVEF 30-35%, grade II diastolic dysfunction, very mild AS, mod MR, mild RV dysfunction, mild to mod TR, PASP 57 - negative 2.7 liters yesterday, negative 6.1 liters since admission, he is on lasix 40gm IV bid. Renal function remains stable, we will continue IV diuresis today. He also had 2 liters removed by a thoracentesis this admission.  - suspected ICM given his strong PAD history. Mild troponin elevation fairly flat, EKG nonacute. Suspect troponin elevation likely due to chronic CAD in setting of CHF as opposed to ACS, however medically manage possible ACS over the weekend until cath Monday.   - medical therapy for suspected ICM with  with ASA 81, atorva 20, coreg 3.125 bid, plavix 75, hep gtt, lisionpril 5,   - we will plan for RHC/LHC tomorrow. Continue diuresis today.  2. PAD - conitnue ASA and plavix.   3. Left arm IV side bleeding - consistent oozing left arm at old IV site, soked through bandages today - rebandaged this AM, if continues would hold heparin for 6 hours.   I have reviewed the risks, indications, and alternatives to cardiac catheterization, possible angioplasty, and stenting with the patient today. Risks include but are not limited to bleeding, infection, vascular injury, stroke, myocardial infection, arrhythmia, kidney injury, radiation-related injury in the case of prolonged fluoroscopy use, emergency cardiac surgery, and death. The patient understands the risks of serious complication is 1-2 in 1000 with diagnostic cardiac cath and 1-2% or less with angioplasty/stenting.    For questions or updates, please contact [facility] Please consult [email or web address] for contact info under Cardiology/STEMI.      Joanie Coddington, MD  04/03/2017, 8:14 AM

## 2017-04-03 NOTE — Progress Notes (Signed)
Notified by nurse of continued IV site bleeding. As previously recommended in Dr. Verna Czech note, hold heparin for 6 hours. Wrote care order to do so and updated Dr. Wyline Mood. ALso wrote to obtain baseline PT/INR to exclude underlying coagulopathy not previously apparent.Dayna Dunn PA-C

## 2017-04-03 NOTE — Progress Notes (Signed)
Oozing from old IV site has stopped.  No other s/s of bleeding noted.

## 2017-04-03 NOTE — Progress Notes (Signed)
PT/INR normal. Communicated with pharmacist about 6 hour hold for heparin drip. Will give report to night shift RN.

## 2017-04-03 NOTE — Progress Notes (Signed)
ANTICOAGULATION CONSULT NOTE - Follow Up Consult  Pharmacy Consult for Heparin Indication: chest pain/ACS  No Known Allergies  Patient Measurements: Height:  (193 cm) Weight: 154 lb 5.2 oz (70 kg) IBW/kg (Calculated) : 86.8 Heparin Dosing Weight:   Vital Signs: Temp: 97.9 F (36.6 C) (09/15 2306) Temp Source: Oral (09/15 2306) BP: 141/74 (09/15 2306) Pulse Rate: 64 (09/15 2306)  Labs:  Recent Labs  03/31/17 0936  04/01/17 0825  04/02/17 0115 04/02/17 1234 04/02/17 2040 04/03/17 0031 04/03/17 0548  HGB  --   < > 8.9*  --  9.2*  --   --  9.9*  --   HCT  --   --  27.7*  --  29.2*  --   --  31.1*  --   PLT  --   --  383  --  403*  --   --   --   --   HEPARINUNFRC  --   --   --   < > <0.10* <0.10* 0.11*  --  0.12*  CREATININE  --   --  1.03  --  1.12  --   --   --   --   TROPONINI 0.29*  --   --   --   --   --   --   --   --   < > = values in this interval not displayed.  Estimated Creatinine Clearance: 56.4 mL/min (by C-G formula based on SCr of 1.12 mg/dL).   Medications:  Infusions:  . sodium chloride    . heparin 1,550 Units/hr (04/03/17 0313)  . heparin      Assessment: Patient with low heparin level.  RN notes that prior iv site bleeding has stopped.  No other heparin issues per RN.  Goal of Therapy:  Heparin level 0.3-0.7 units/ml Monitor platelets by anticoagulation protocol: Yes   Plan:  Increase heparin to 1700 units/hr Recheck level at [address], Bernice Crowford 04/03/2017,6:43 AM

## 2017-04-04 ENCOUNTER — Encounter (HOSPITAL_COMMUNITY)
Admission: EM | Disposition: A | Discharge status: Skilled Nursing Facility | Payer: Self-pay | Source: Home / Self Care | Attending: Internal Medicine

## 2017-04-04 DIAGNOSIS — I5043 Acute on chronic combined systolic (congestive) and diastolic (congestive) heart failure: Secondary | ICD-10-CM

## 2017-04-04 DIAGNOSIS — I2510 Atherosclerotic heart disease of native coronary artery without angina pectoris: Secondary | ICD-10-CM

## 2017-04-04 DIAGNOSIS — I25110 Atherosclerotic heart disease of native coronary artery with unstable angina pectoris: Secondary | ICD-10-CM

## 2017-04-04 HISTORY — PX: RIGHT/LEFT HEART CATH AND CORONARY ANGIOGRAPHY: CATH118266

## 2017-04-04 LAB — POCT I-STAT 3, VENOUS BLOOD GAS (G3P V)
Acid-Base Excess: 8 mmol/L — ABNORMAL HIGH (ref 0.0–2.0)
Acid-Base Excess: 7 mmol/L — ABNORMAL HIGH (ref 0.0–2.0)
Acid-Base Excess: 8 mmol/L — ABNORMAL HIGH (ref 0.0–2.0)
BICARBONATE: 32.3 mmol/L — AB (ref 20.0–28.0)
BICARBONATE: 32.5 mmol/L — AB (ref 20.0–28.0)
Bicarbonate: 33.6 mmol/L — ABNORMAL HIGH (ref 20.0–28.0)
O2 SAT: 58 %
O2 Saturation: 95 %
O2 Saturation: 58 %
PCO2 VEN: 41.5 mmHg — AB (ref 44.0–60.0)
PCO2 VEN: 48.7 mmHg (ref 44.0–60.0)
PCO2 VEN: 49.8 mmHg (ref 44.0–60.0)
PH VEN: 7.5 — AB (ref 7.250–7.430)
PH VEN: 7.433 — AB (ref 7.250–7.430)
PO2 VEN: 30 mmHg — AB (ref 32.0–45.0)
TCO2: 34 mmol/L — ABNORMAL HIGH (ref 22–32)
TCO2: 34 mmol/L — ABNORMAL HIGH (ref 22–32)
TCO2: 35 mmol/L — ABNORMAL HIGH (ref 22–32)
pH, Ven: 7.437 — ABNORMAL HIGH (ref 7.250–7.430)
pO2, Ven: 69 mmHg — ABNORMAL HIGH (ref 32.0–45.0)
pO2, Ven: 30 mmHg — CL (ref 32.0–45.0)

## 2017-04-04 LAB — HEPARIN LEVEL (UNFRACTIONATED): Heparin Unfractionated: <0.1 IU/mL — ABNORMAL LOW (ref 0.30–0.70)

## 2017-04-04 LAB — CBC
HEMATOCRIT: 29.1 % — AB (ref 39.0–52.0)
HEMOGLOBIN: 9.4 g/dL — AB (ref 13.0–17.0)
MCH: 22.5 pg — ABNORMAL LOW (ref 26.0–34.0)
MCHC: 32.3 g/dL (ref 30.0–36.0)
MCV: 69.8 fL — ABNORMAL LOW (ref 78.0–100.0)
Platelets: 427 10*3/uL — ABNORMAL HIGH (ref 150–400)
RBC: 4.17 MIL/uL — ABNORMAL LOW (ref 4.22–5.81)
RDW: 18.4 % — ABNORMAL HIGH (ref 11.5–15.5)
WBC: 9.3 10*3/uL (ref 4.0–10.5)

## 2017-04-04 LAB — BASIC METABOLIC PANEL
ANION GAP: 8 (ref 5–15)
BUN: 19 mg/dL (ref 6–20)
CALCIUM: 8.3 mg/dL — AB (ref 8.9–10.3)
CHLORIDE: 99 mmol/L — AB (ref 101–111)
CO2: 30 mmol/L (ref 22–32)
CREATININE: 1.17 mg/dL (ref 0.61–1.24)
GFR calc non Af Amer: 59 mL/min — ABNORMAL LOW (ref >60)
Glucose, Bld: 74 mg/dL (ref 65–99)
Potassium: 4.1 mmol/L (ref 3.5–5.1)
SODIUM: 137 mmol/L (ref 135–145)

## 2017-04-04 LAB — T3, FREE: T3 FREE: 1.3 pg/mL — AB (ref 2.0–4.4)

## 2017-04-04 LAB — PROTIME-INR
INR: 1.21
Prothrombin Time: 15.2 seconds (ref 11.4–15.2)

## 2017-04-04 LAB — MRSA PCR SCREENING: MRSA BY PCR: POSITIVE — AB

## 2017-04-04 SURGERY — RIGHT/LEFT HEART CATH AND CORONARY ANGIOGRAPHY
Anesthesia: LOCAL

## 2017-04-04 MED ORDER — VERAPAMIL HCL 2.5 MG/ML IV SOLN
INTRAVENOUS | Status: DC | PRN
  Administered 2017-04-04: 12:00:00 via INTRA_ARTERIAL

## 2017-04-04 MED ORDER — SODIUM CHLORIDE 0.9% FLUSH: 3.0000 mL | INTRAVENOUS | Status: DC | PRN

## 2017-04-04 MED ORDER — LIDOCAINE HCL (PF) 1 % IJ SOLN
INTRAMUSCULAR | Status: DC | PRN
  Administered 2017-04-04 (×2): 2 mL via INTRADERMAL

## 2017-04-04 MED ORDER — IOPAMIDOL (ISOVUE-370) INJECTION 76%
INTRAVENOUS | Status: AC
  Filled 2017-04-04: qty 50

## 2017-04-04 MED ORDER — HEPARIN SODIUM (PORCINE) 1000 UNIT/ML IJ SOLN
INTRAMUSCULAR | Status: AC
  Filled 2017-04-04: qty 1

## 2017-04-04 MED ORDER — HEPARIN (PORCINE) IN NACL 2-0.9 UNIT/ML-% IJ SOLN
INTRAMUSCULAR | Status: AC | PRN
  Administered 2017-04-04: 1000 mL via INTRA_ARTERIAL

## 2017-04-04 MED ORDER — MIDAZOLAM HCL 2 MG/2ML IJ SOLN
INTRAMUSCULAR | Status: AC
  Filled 2017-04-04: qty 2

## 2017-04-04 MED ORDER — IOPAMIDOL (ISOVUE-370) INJECTION 76%
INTRAVENOUS | Status: AC
  Filled 2017-04-04: qty 100

## 2017-04-04 MED ORDER — HEPARIN (PORCINE) IN NACL 2-0.9 UNIT/ML-% IJ SOLN
INTRAMUSCULAR | Status: AC
Start: 2017-04-04 — End: ?
  Filled 2017-04-04: qty 1000

## 2017-04-04 MED ORDER — SODIUM CHLORIDE 0.9 % IV SOLN
INTRAVENOUS | Status: AC
  Administered 2017-04-04: 14:00:00 via INTRAVENOUS

## 2017-04-04 MED ORDER — IOPAMIDOL (ISOVUE-370) INJECTION 76%
INTRAVENOUS | Status: DC | PRN
  Administered 2017-04-04: 90 mL via INTRA_ARTERIAL

## 2017-04-04 MED ORDER — FENTANYL CITRATE (PF) 100 MCG/2ML IJ SOLN
INTRAMUSCULAR | Status: AC
  Filled 2017-04-04: qty 2

## 2017-04-04 MED ORDER — VERAPAMIL HCL 2.5 MG/ML IV SOLN
INTRAVENOUS | Status: AC
  Filled 2017-04-04: qty 2

## 2017-04-04 MED ORDER — MIDAZOLAM HCL 2 MG/2ML IJ SOLN
INTRAMUSCULAR | Status: DC | PRN
  Administered 2017-04-04: 0.5 mg via INTRAVENOUS

## 2017-04-04 MED ORDER — HEPARIN SODIUM (PORCINE) 1000 UNIT/ML IJ SOLN
INTRAMUSCULAR | Status: DC | PRN
  Administered 2017-04-04: 4000 [IU] via INTRAVENOUS

## 2017-04-04 MED ORDER — SODIUM CHLORIDE 0.9% FLUSH
3.0000 mL | Freq: Two times a day (BID) | INTRAVENOUS | Status: DC
  Administered 2017-04-04 – 2017-04-05 (×2): 3 mL via INTRAVENOUS

## 2017-04-04 MED ORDER — SODIUM CHLORIDE 0.9 % IV SOLN: 250.0000 mL | INTRAVENOUS | Status: DC | PRN

## 2017-04-04 MED ORDER — LEVOTHYROXINE SODIUM 25 MCG PO TABS
25.0000 ug | ORAL_TABLET | Freq: Every day | ORAL | Status: DC
  Administered 2017-04-05 – 2017-04-06 (×2): 25 ug via ORAL
  Filled 2017-04-04 (×2): qty 1

## 2017-04-04 MED ORDER — FENTANYL CITRATE (PF) 100 MCG/2ML IJ SOLN
INTRAMUSCULAR | Status: DC | PRN
  Administered 2017-04-04: 12.5 ug via INTRAVENOUS

## 2017-04-04 SURGICAL SUPPLY — 12 items
CATH 5FR JL3.5 JR4 ANG PIG MP (CATHETERS) ×2 IMPLANT
CATH BALLN WEDGE 5F 110CM (CATHETERS) ×2 IMPLANT
DEVICE RAD COMP TR BAND LRG (VASCULAR PRODUCTS) ×2 IMPLANT
GLIDESHEATH SLEND A-KIT 6F 22G (SHEATH) ×2 IMPLANT
GUIDEWIRE INQWIRE 1.5J.035X260 (WIRE) ×1 IMPLANT
INQWIRE 1.5J .035X260CM (WIRE) ×2
KIT HEART LEFT (KITS) ×2 IMPLANT
PACK CARDIAC CATHETERIZATION (CUSTOM PROCEDURE TRAY) ×2 IMPLANT
SHEATH GLIDE SLENDER 4/5FR (SHEATH) ×2 IMPLANT
TRANSDUCER W/STOPCOCK (MISCELLANEOUS) ×2 IMPLANT
TUBING CIL FLEX 10 FLL-RA (TUBING) ×2 IMPLANT
WIRE EMERALD 3MM-J .025X260CM (WIRE) ×2 IMPLANT

## 2017-04-04 NOTE — Progress Notes (Signed)
ANTICOAGULATION CONSULT NOTE  Pharmacy Consult for heparin  Indication: chest pain/ACS  No Known Allergies  Patient Measurements: Height:  (193 cm) Weight: 154 lb 8.7 oz (70.1 kg) IBW/kg (Calculated) : 86.8 Heparin Dosing Weight: 75kg  Vital Signs: Temp: 97.8 F (36.6 C) (09/17 0544) Temp Source: Oral (09/17 0544) BP: 127/62 (09/17 0544) Pulse Rate: 55 (09/17 0544)  Labs:  Recent Labs  04/02/17 0115  04/03/17 0031 04/03/17 0548 04/03/17 1438 04/03/17 1617 04/04/17 0536  HGB 9.2*  --  9.9* 9.7*  --   --  9.4*  HCT 29.2*  --  31.1* 30.3*  --   --  29.1*  PLT 403*  --   --  452*  --   --  427*  LABPROT  --   --   --   --   --  14.7 15.2  INR  --   --   --   --   --  1.16 1.21  HEPARINUNFRC <0.10*  < >  --  0.12* 0.14*  --  <0.10*  CREATININE 1.12  --   --  1.08  --   --  1.17  < > = values in this interval not displayed.  Estimated Creatinine Clearance: 54.1 mL/min (by C-G formula based on SCr of 1.17 mg/dL).  Assessment: 78 YOF presenting to [facility] ED 9/12 with shortness of breath d/t pleural effusion and new onset systolic CHF. He has h/o PAD (s/p R BKA). Troponin elevated, cardiology has seen patient and concerned for ACS vs chronic CAD in setting of CHF. Planning for [facility] on 9/17 but covering with IV heparin until then to r/o ACS; Pharmacy to dose.   Baseline INR, aPTT: not done  Prior anticoagulation: ASA/Plavix PTA; last dose LMWH ppx 9/13 at 2200  Significant events:  Thoracentesis 9/13  Patient with bleeding at IV site starting 9/15 PM and continuing through 9/16. Cardiology notified 9/16 at 1630 and heparin held x6 hours, to resume per pharmacy afterward  Today, 04/04/2017  CBC: Hgb stable, pltc WNL  Renal: SCr WNL  Heparin held x 6 hrs last night by cards for continued IV site bleeding, held from 1630 to 2300 and then resumed at 2300 at rate of 1500 units/hr  AM heparin level undetectable at <0.1 on 1500 units/hr for 6.5 hours  INR 1.16>1.21,  no coumadin on board  Goal of Therapy: Heparin level 0.3-0.7 units/ml Monitor platelets by anticoagulation protocol: Yes  Plan:  Continue heparin at 1500 units/hr - will not advance until cleared by MD  To have RHC/LHC today  Will continue to check daily heparin levels, but it appears that heparin levels may not correlate well with bleeding risk in this patient. Cardiology is aware that heparin is subtherapeutic and will not be advanced by pharmacy while patient continues to bleed;   Daily CBC level  Monitor for resolution of bleeding or signs of thrombosis  Herby Abraham, Pharm.D. [phone] 04/04/2017 7:23 AM

## 2017-04-04 NOTE — Progress Notes (Signed)
Patient has eaten [place] sandwich. Dr. Tyrone Sage talking w/patient.

## 2017-04-04 NOTE — Care Management Important Message (Signed)
Important Message  Patient Details IM Letter given to Kathy/Case Manager to present to Patient Name: Caleb Hays MRN: [id number] Date of Birth: 1941-04-22   Medicare Important Message Given:  Yes    Caren Macadam 04/04/2017, 9:37 AM

## 2017-04-04 NOTE — Progress Notes (Signed)
ANTICOAGULATION CONSULT NOTE  Pharmacy Consult for heparin  Indication: chest pain/ACS  No Known Allergies  Patient Measurements: Height:  (193 cm) Weight: 151 lb 14.4 oz (68.9 kg) IBW/kg (Calculated) : 86.8 Heparin Dosing Weight: 75kg  Vital Signs: Temp: 97.8 F (36.6 C) (09/17 0544) Temp Source: Oral (09/17 0544) BP: 114/64 (09/17 1312) Pulse Rate: 60 (09/17 1312)  Labs:  Recent Labs  04/02/17 0115  04/03/17 0031 04/03/17 0548 04/03/17 1438 04/03/17 1617 04/04/17 0536  HGB 9.2*  --  9.9* 9.7*  --   --  9.4*  HCT 29.2*  --  31.1* 30.3*  --   --  29.1*  PLT 403*  --   --  452*  --   --  427*  LABPROT  --   --   --   --   --  14.7 15.2  INR  --   --   --   --   --  1.16 1.21  HEPARINUNFRC <0.10*  < >  --  0.12* 0.14*  --  <0.10*  CREATININE 1.12  --   --  1.08  --   --  1.17  < > = values in this interval not displayed.  Estimated Creatinine Clearance: 53.2 mL/min (by C-G formula based on SCr of 1.17 mg/dL).  Assessment: 47 YOM presenting to [facility] ED 9/12 with shortness of breath d/t pleural effusion and new onset systolic CHF. He has h/o PAD (s/p R BKA).  He transferred here for cath and to restart heparin 8 hours post sheath removal (removed at ~ 1:15pm). -Last heparin rate was 1500 units/hr w/ heparin level < 0.1 but also noted with recent IV site bleeding on 9/16  Goal of Therapy: Heparin level 0.3-0.7 units/ml Monitor platelets by anticoagulation protocol: Yes  Plan: -restart heparin at 1600 units/hr -Heparin level in 8 hours and daily wth CBC daily -Will follow procedural plans  Harland German, Pharm D 04/04/2017 1:20 PM

## 2017-04-04 NOTE — Progress Notes (Signed)
PROGRESS NOTE    Caleb Hays  ZOX:[id number] DOB: [birth year] DOA: 03/30/2017 PCP: Patient, No Pcp Per    Brief Narrative: Caleb Hays is a 76 y.o. 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 Patient was admitted to the hospital 2 weeks ago with hypoxia found to have a right-sided pleural effusion, underwent thoracentesis with 2 L removed was treated with antibiotics and completed a course of Levaquin after discharge, and felt as though he was back to his baseline briefly before the insidious development of shortness of breath over the past several days. Patient denies chest pain, denies any significant cough, and denies fevers or chills. Reports that he was slightly dyspneic a few days ago, but this has progressively worsened to the point where he has trouble catching his breath while at rest today. X-ray was performed at his nursing facility and concerning for reaccumulation of the pleural effusion on the right side.  ED Course: Upon arrival to the ED, patient is found to be afebrile, saturating adequately on 4 L/m supplemental oxygen, and with vitals otherwise stable. EKG features a sinus rhythm with low-voltage QRS and chest x-ray is notable for interval reaccumulation of moderate loculated right pleural effusion. Chemistry panel was unremarkable and CBC is notable for stable microcytic anemia with hemoglobin of 9.5 and MCV of 69.0. Troponin is elevated to 0.35. Patient was treated with 324 mg of aspirin and DuoNeb in the ED. He remained hemodynamically stable, continues to deny any chest pain, is dyspneic but not in acute distress, and will be admitted to the telemetry unit for ongoing evaluation and management of recurrent dyspnea with hypoxic respiratory  failure secondary to right-sided pleural effusion.    Assessment & Plan:   Principal Problem:   Recurrent right pleural effusion Active Problems:   COPD (chronic obstructive pulmonary disease) (HCC)   Anemia of chronic disease   Hypothyroidism   Elevated troponin   Chronic respiratory failure with hypoxia (HCC)   Dilated cardiomyopathy (HCC)   Acute on chronic combined systolic and diastolic CHF (congestive heart failure) (HCC)   Recurrent right loculated pleural effusion; Acute hypoxic Respiratory Failure. Related to Heart Failure.  BNP elevated.  Repeat thoracentesis. cell count normal. , culture no growth to date. .  Discontinue antibiotics, pleural fluid, gram stain negative, no significant WBC. Cytology mesothelial cell, mix lymphoid population.  Continue with lasix.  Repeat chest x ray 9-18  Acute systolic and diastolic HF;  EF 35 %, diastolic dysfunction grade 2.  Continue with lasix. 40 mg IV BID Started ACE. BB Cardiology consulted.  Negative 8 L.  For cath today. Transfer [facility].   Mild elevation troponin.  Demand.  ECHO. Ef 35 %  EKG low voltage criteria.  Continue ASA 81 and Plavix  Cardiology consulted.  Treating for ACS, heparin, aspirin. Cath today/ He deneis chest pain.   Hypothyroidism;  Continue with synthroid.  TSH elevated.  Free T 3 low,. Will increase synthroid.   COPD  - No cough or wheeze on admission  - Continue DuoNeb and supplemental O2   Microcytic anemia  - Hgb is stable at 9.5 with no bleeding evident  - Recent anemia panel consistent with iron-deficiency    DVT prophylaxis: SCD, lovenox Code Status: Full code.  Family Communication: discussed with patient.  Disposition Plan:  To be determine  Consultants:   none   Procedures: thoracentesis   Antimicrobials:  Ceftriaxone and Azithromycin discontinue   Subjective: He denies dyspnea, chest pain.   Objective: Vitals:   04/04/17 1350 04/04/17 1405 04/04/17 1420  04/04/17 1450  BP: 119/65 125/68 (!) 144/52 (!) 129/59  Pulse: 60 (!) 59 60 62  Resp: Temp:      TempSrc:      SpO2: 95% 97% 99% 99%  Weight:      Height:        Intake/Output Summary (Last 24 hours) at 04/04/17 1507 Last data filed at 04/04/17 0600  Gross per 24 hour  Intake           500.42 ml  Output             3050 ml  Net         -2549.58 ml   Filed Weights   04/02/17 0542 04/03/17 0500 04/04/17 0500  Weight: 70 kg (154 lb 5.2 oz) 70.1 kg (154 lb 8.7 oz) 68.9 kg (151 lb 14.4 oz)    Examination:  General exam: NAD Respiratory system: normal respiratory effort, Crackles bases.  Cardiovascular system; S 1, S 2 RRR Gastrointestinal system: Abdomen is soft, nt Central nervous system: alert and oriented.  Extremities: plus 1 edema, right BKA, left foot post amputation  Skin: No rashes, lesions or ulcers    Data Reviewed: I have personally reviewed following labs and imaging studies  CBC:  Recent Labs Lab 03/30/17 1622 03/31/17 0457 04/01/17 0825 04/02/17 0115 04/03/17 0031 04/03/17 0548 04/04/17 0536  WBC 7.5 6.9 6.8 8.4  --  9.5 9.3  NEUTROABS 3.9  --   --   --   --   --   --   HGB 9.5* 9.2* 8.9* 9.2* 9.9* 9.7* 9.4*  HCT 30.5* 29.1* 27.7* 29.2* 31.1* 30.3* 29.1*  MCV 69.0* 69.0* 69.4* 68.2*  --  67.8* 69.8*  PLT 396 376 383 403*  --  452* 427*   Basic Metabolic Panel:  Recent Labs Lab 03/31/17 0457 04/01/17 0825 04/02/17 0115 04/03/17 0548 04/04/17 0536  NA 138 137 138 137 137  K 4.3 4.5 4.2 4.2 4.1  CL 103 101 101 99* 99*  CO2 GLUCOSE 87 74 86 74 74  BUN CREATININE 1.08 1.03 1.12 1.08 1.17  CALCIUM 8.4* 8.3* 8.5* 8.4* 8.3*   GFR: Estimated Creatinine Clearance: 53.2 mL/min (by C-G formula based on SCr of 1.17 mg/dL). Liver Function Tests:  Recent Labs Lab 03/30/17 1622  AST 17  ALT 10*  ALKPHOS 70  BILITOT 0.7  PROT 5.9*  ALBUMIN 3.2*   No results for input(s): LIPASE, AMYLASE in the  last 168 hours. No results for input(s): AMMONIA in the last 168 hours. Coagulation Profile:  Recent Labs Lab 04/03/17 1617 04/04/17 0536  INR 1.16 1.21   Cardiac Enzymes:  Recent Labs Lab 03/30/17 1622 03/30/17 2350 03/31/17 0457 03/31/17 0936  TROPONINI 0.35* 0.37* 0.32* 0.29*   BNP (last 3 results) No results for input(s): PROBNP in the last 8760 hours. HbA1C: No results for input(s): HGBA1C in the last 72 hours. CBG: No results for input(s): GLUCAP in the last 168 hours. Lipid Profile:  Recent Labs  04/03/17 0548  CHOL 116  HDL 20*  LDLCALC 67  TRIG 811  CHOLHDL 5.8   Thyroid Function Tests:  Recent Labs  04/02/17 0857  04/03/17 0548  TSH 7.110*  --   FREET4  --  0.80  T3FREE  --  1.3*   Anemia Panel: No results for input(s): VITAMINB12, FOLATE, FERRITIN, TIBC, IRON, RETICCTPCT in the last 72 hours. Sepsis Labs: No results for input(s): PROCALCITON, LATICACIDVEN in the last 168 hours.  Recent Results (from the past 240 hour(s))  MRSA PCR Screening     Status: None   Collection Time: 03/31/17  6:45 AM  Result Value Ref Range Status   MRSA by PCR NEGATIVE NEGATIVE Final    Comment:        The GeneXpert MRSA Assay (FDA approved for NASAL specimens only), is one component of a comprehensive MRSA colonization surveillance program. It is not intended to diagnose MRSA infection nor to guide or monitor treatment for MRSA infections.   Culture, body fluid-bottle     Status: None (Preliminary result)   Collection Time: 03/31/17  5:38 PM  Result Value Ref Range Status   Specimen Description PLEURAL FLUID  Final   Special Requests BOTTLES DRAWN AEROBIC AND ANAEROBIC  Final   Culture   Final    NO GROWTH 3 DAYS Performed at [facility], [address] [place], [place] [address]    Report Status PENDING  Incomplete  Gram stain     Status: None   Collection Time: 03/31/17  5:38 PM  Result Value Ref Range Status   Specimen Description  PLEURAL FLUID  Final   Special Requests NONE  Final   Gram Stain   Final    CYTOSPIN SMEAR WBC PRESENT,BOTH PMN AND MONONUCLEAR NO ORGANISMS SEEN Performed at [facility], [address], [place], [place] [address]    Report Status 04/01/2017 FINAL  Final         Radiology Studies: No results found.      Scheduled Meds: . [MAR Hold] allopurinol  100 mg Oral Daily  . [MAR Hold] aspirin  81 mg Oral Daily  . [MAR Hold] atorvastatin  20 mg Oral q1800  . [MAR Hold] carvedilol  3.125 mg Oral BID  . [MAR Hold] cholecalciferol  1,000 Units Oral Daily  . [MAR Hold] clopidogrel  75 mg Oral Q breakfast  . [MAR Hold] furosemide  40 mg Intravenous BID  . [MAR Hold] gabapentin  200 mg Oral QHS  . [MAR Hold] ipratropium-albuterol  3 mL Nebulization QID  . [MAR Hold] levothyroxine  12.5 mcg Oral Q0600  . [MAR Hold] lisinopril  5 mg Oral Daily  . [MAR Hold] multivitamin with minerals  1 tablet Oral Daily  . [MAR Hold] pantoprazole  40 mg Oral Daily  . [MAR Hold] potassium chloride  20 mEq Oral Daily  . [MAR Hold] saccharomyces boulardii  250 mg Oral BID  . [MAR Hold] sodium chloride flush  3 mL Intravenous Q12H  . [MAR Hold] sodium chloride flush  3 mL Intravenous Q12H  . sodium chloride flush  3 mL Intravenous Q12H  . [MAR Hold] traZODone  50 mg Oral QHS   Continuous Infusions: . [MAR Hold] sodium chloride    . sodium chloride    . sodium chloride Stopped (04/04/17 0931)  . sodium chloride 100 mL/hr at 04/04/17 1357  . heparin Stopped (04/04/17 0930)     LOS: 5 days    Time spent: 35 minutes,     Marquis Down, Prentiss Bells, MD [facility] Pager [phone]  If 7PM-7AM, please contact night-coverage [email or web address] Password [email or web address] 04/04/2017, 3:07 PM

## 2017-04-04 NOTE — Consult Note (Signed)
[address]       [place] [address]             [phone]        Lakyn J Masoner  Medical Record #[id number] Date of Birth: [birth year]  Referring:Dr Ellyn Hack Primary Cardiologist : Dr Gwenlyn Found Primary Care: none recorded   Chief Complaint:    Chief Complaint  Patient presents with  . Shortness of Breath    History of Present Illness:  The patient is a 76 year old male resident at [facility] with a history of multiple medical comorbidities including COPD, peripheral arterial disease status post right BKA and left TMA, hypothyroidism, recent hospitalization for respiratory failure secondary to pleural effusion and COPD. He was recently evaluated in the emergency department for worsening dyspnea. He was hospitalized approximately 2 weeks ago at which time he had a right-sided pleural effusion which underwent thoracentesis with 2 L removed at that time. He was also treated with antibiotics and a complete course of Levaquin at discharge. Most recently he has redeveloped significant shortness of breath. Chest x-ray revealed reaccumulation of the pleural effusion on the right side. He presented to the emergency room and was found to be saturating adequately on 4 L of supplemental oxygen. A troponin was noted to be elevated at point 3 5. He was felt to require readmission for further management of his dyspnea as well as pleural effusion.  He has undergone previous percutaneous procedures on his left leg by Dr. Donzetta Matters for nonhealing wound of his left lower extremity. An echocardiogram done 03/31/2017 showed reduced LVEF equal to 30-35% with mildly S, moderate MR, severe LAE, mild-to-moderate TR with estimated RVSP of 57 mmHg. Repeat thoracentesis didn't improve his shortness of breath symptoms. He underwent cardiac catheterization today and was found to have severe left main and multivessel coronary artery disease. We are asked to see the patient in cardiothoracic  surgical consultation for consideration of coronary artery surgical revascularization.    Current Activity/ Functional Status: limited d/t multiple medical co-morbidities anf previous amputation   Zubrod Score: At the time of surgery this patient's most appropriate activity status/level should be described as: []     0    Normal activity, no symptoms []     1    Restricted in physical strenuous activity but ambulatory, able to do out light work []     2    Ambulatory and capable of self care, unable to do work activities, up and about                 more than 50%  Of the time                            [x]     3    Only limited self care, in bed greater than 50% of waking hours []     4    Completely disabled, no self care, confined to bed or chair []     5    Moribund  Past Medical History:  Diagnosis Date  . Adult failure to thrive    /notes 06/23/2015  . Anemia   . Arthritis    "hands, fingers" (11/10/2016  . COPD (chronic obstructive pulmonary disease) (HCC)    LONG TIME SMOKER  . Critical lower limb ischemia   . 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 Depression    "periods of  depression" (06/23/2015)  . Gout   . Hypothyroidism   . PAD (peripheral artery disease) ([facility])   . Physical deconditioning   . Pyelonephritis 06/23/2015  . Status post foot surgery    right fifth toe amputation by Dr. Sharol Given     Past Surgical History:  Procedure Laterality Date  . ABDOMINAL AORTOGRAM W/LOWER EXTREMITY N/A 09/03/2016   Procedure: Abdominal Aortogram w/Lower Extremity;  Surgeon: Elam Dutch, MD;  Location: [facility];  Service: Cardiovascular;  Laterality: N/A;  Lt. leg  . ABDOMINAL AORTOGRAM W/LOWER EXTREMITY N/A 11/10/2016   Procedure: Abdominal Aortogram w/Lower Extremity;  Surgeon: Waynetta Sandy, MD;  Location: [facility];  Service: Cardiovascular;  Laterality: N/A;  . AMPUTATION Right 03/01/2014   Procedure: AMPUTATION RAY;   Surgeon: Newt Minion, MD;  Location: [place];  Service: Orthopedics;  Laterality: Right;  Right Foot 5th Ray Amputation  . AMPUTATION Right 04/26/2014   Procedure: Right Foot 4th Ray Amputation;  Surgeon: Newt Minion, MD;  Location: [facility];  Service: Orthopedics;  Laterality: Right;  . AMPUTATION Right 10/01/2015   Procedure: Right Below Knee Amputation;  Surgeon: Newt Minion, MD;  Location: [place];  Service: Orthopedics;  Laterality: Right;  . AMPUTATION Left 10/08/2016   Procedure: Left Transmetatarsal Amputation;  Surgeon: Newt Minion, MD;  Location: [facility];  Service: Orthopedics;  Laterality: Left;  . AMPUTATION Left 07/23/2016   Procedure: Left 2nd Toe Amputation;  Surgeon: Newt Minion, MD;  Location: [place];  Service: Orthopedics;  Laterality: Left;  . ANKLE ARTHROSCOPY Right 08/13/2015   Procedure: ANKLE ARTHROSCOPY;  Surgeon: Newt Minion, MD;  Location: [place];  Service: Orthopedics;  Laterality: Right;  . CATARACT EXTRACTION W/ INTRAOCULAR LENS  IMPLANT, BILATERAL Bilateral   . INGUINAL HERNIA REPAIR Left   . IR THORACENTESIS ASP PLEURAL SPACE W/IMG GUIDE  03/18/2017  . KNEE CARTILAGE SURGERY Left 1960's   football injury  . KNEE LIGAMENT RECONSTRUCTION Left 1960's  . LOWER EXTREMITY ANGIOGRAM N/A 05/27/2014   Procedure: LOWER EXTREMITY ANGIOGRAM;  Surgeon: Lorretta Harp, MD;  Location: [facility];  Service: Cardiovascular;  Laterality: N/A;  . LOWER EXTREMITY ANGIOGRAPHY Left 09/15/2016   Procedure: Lower Extremity Angiography;  Surgeon: Waynetta Sandy, MD;  Location: [facility];  Service: Cardiovascular;  Laterality: Left;  . PERIPHERAL VASCULAR ATHERECTOMY  11/10/2016   Procedure: Peripheral Vascular Atherectomy;  Surgeon: Waynetta Sandy, MD;  Location: [facility];  Service: Cardiovascular;;  . PERIPHERAL VASCULAR INTERVENTION Left 09/15/2016   Procedure: Peripheral Vascular Intervention;  Surgeon: Waynetta Sandy, MD;  Location: [facility];  Service: Cardiovascular;  Laterality: Left;  SFA/popiteal  . PERIPHERAL VASCULAR INTERVENTION  11/10/2016   Archie Endo 11/10/2016  . PERIPHERAL VASCULAR INTERVENTION  11/10/2016   Procedure: Peripheral Vascular Intervention;  Surgeon: Waynetta Sandy, MD;  Location: [facility];  Service: Cardiovascular;;  . REFRACTIVE SURGERY Bilateral   . STUMP REVISION Right 12/05/2015   Procedure: Revision Right Below Knee Amputation;  Surgeon: Newt Minion, MD;  Location: [facility];  Service: Orthopedics;  Laterality: Right;  . STUMP REVISION Left 11/19/2016   Procedure: Revision Left Transmetatarsal Amputation;  Surgeon: Newt Minion, MD;  Location: [facility];  Service: Orthopedics;  Laterality: Left;    History  Smoking Status  . Former Smoker  . Packs/day: 1.00  . Years: 57.00  . Types: Cigarettes  . Quit date: 03/19/2016  Smokeless  Tobacco  . Never Used    History  Alcohol Use No    Comment: 11/10/2016 I was drinking alot of beer; quit in 2015"     Social History   Social History  . Marital status: Single    Spouse name: N/A  . Number of children: N/A  . Years of education: N/A   Occupational History  . Not on file.   Social History Main Topics  . Smoking status: Former Smoker    Packs/day: 1.00    Years: 57.00    Types: Cigarettes    Quit date: 03/19/2016  . Smokeless tobacco: Never Used  . Alcohol use No     Comment: 11/10/2016 I was drinking alot of beer; quit in 2015"   . Drug use: No  . Sexual activity: No   Other Topics Concern  . Not on file   Social History Narrative  . No narrative on file    No Known Allergies  Current Facility-Administered Medications  Medication Dose Route Frequency Provider Last Rate Last Dose  . [MAR Hold] 0.9 %  sodium chloride infusion  250 mL Intravenous PRN Opyd, Ilene Qua, MD      . 0.9 %  sodium chloride infusion  250 mL Intravenous PRN Arnoldo Lenis, MD      . 0.9 %  sodium chloride infusion   Intravenous  Continuous Branch, Alphonse Guild, MD   Stopped at 04/04/17 [phone]  . 0.9 %  sodium chloride infusion   Intravenous Continuous Leonie Man, MD 100 mL/hr at 04/04/17 1357    . [MAR Hold] acetaminophen (TYLENOL) tablet 650 mg  650 mg Oral Q6H PRN Opyd, Ilene Qua, MD       Or  . Doug Sou Hold] acetaminophen (TYLENOL) suppository 650 mg  650 mg Rectal Q6H PRN Opyd, Ilene Qua, MD      . Doug Sou Hold] allopurinol (ZYLOPRIM) tablet 100 mg  100 mg Oral Daily Opyd, Ilene Qua, MD   100 mg at 04/04/17 0919  . [MAR Hold] aspirin chewable tablet 81 mg  81 mg Oral Daily Opyd, Ilene Qua, MD   81 mg at 04/03/17 1018  . [MAR Hold] atorvastatin (LIPITOR) tablet 20 mg  20 mg Oral q1800 Burnell Blanks, MD   20 mg at 04/03/17 1842  . [MAR Hold] carvedilol (COREG) tablet 3.125 mg  3.125 mg Oral BID Burnell Blanks, MD   3.125 mg at 04/04/17 0919  . [MAR Hold] cholecalciferol (VITAMIN D) tablet 1,000 Units  1,000 Units Oral Daily Opyd, Ilene Qua, MD   1,000 Units at 04/04/17 0919  . [MAR Hold] clopidogrel (PLAVIX) tablet 75 mg  75 mg Oral Q breakfast Opyd, Ilene Qua, MD   75 mg at 04/04/17 0919  . [MAR Hold] furosemide (LASIX) injection 40 mg  40 mg Intravenous BID Regalado, Belkys A, MD   40 mg at 04/04/17 0918  . [MAR Hold] gabapentin (NEURONTIN) capsule 200 mg  200 mg Oral QHS Opyd, Ilene Qua, MD   200 mg at 04/03/17 2151  . [MAR Hold] guaiFENesin (MUCINEX) 12 hr tablet 1,200 mg  1,200 mg Oral BID PRN Opyd, Ilene Qua, MD      . heparin ADULT infusion 100 units/mL (25000 units/227m sodium chloride 0.45%)  1,500 Units/hr Intravenous Continuous WPolly Cobia RPH   Stopped at 04/04/17 0930  . [MAR Hold] ipratropium-albuterol (DUONEB) 0.5-2.5 (3) MG/3ML nebulizer solution 3 mL  3 mL Nebulization QID PRN Opyd, TIlene Qua MD   3 mL  at 04/01/17 1835  . [MAR Hold] ipratropium-albuterol (DUONEB) 0.5-2.5 (3) MG/3ML nebulizer solution 3 mL  3 mL Nebulization QID Opyd, Ilene Qua, MD   3 mL at 04/04/17 0720  . [MAR  Hold] levothyroxine (SYNTHROID, LEVOTHROID) tablet 12.5 mcg  12.5 mcg Oral Q0600 Opyd, Ilene Qua, MD   12.5 mcg at 04/04/17 0540  . [MAR Hold] lisinopril (PRINIVIL,ZESTRIL) tablet 5 mg  5 mg Oral Daily Regalado, Belkys A, MD   5 mg at 04/04/17 0919  . [MAR Hold] methocarbamol (ROBAXIN) tablet 500 mg  500 mg Oral Q6H PRN Opyd, Ilene Qua, MD      . Doug Sou Hold] multivitamin with minerals tablet 1 tablet  1 tablet Oral Daily Opyd, Ilene Qua, MD   1 tablet at 04/04/17 0919  . [MAR Hold] ondansetron (ZOFRAN) tablet 4 mg  4 mg Oral Q6H PRN Opyd, Ilene Qua, MD       Or  . Doug Sou Hold] ondansetron (ZOFRAN) injection 4 mg  4 mg Intravenous Q6H PRN Opyd, Ilene Qua, MD      . Doug Sou Hold] oxyCODONE (Oxy IR/ROXICODONE) immediate release tablet 5-10 mg  5-10 mg Oral Q4H PRN Opyd, Ilene Qua, MD   5 mg at 04/03/17 2151  . [MAR Hold] pantoprazole (PROTONIX) EC tablet 40 mg  40 mg Oral Daily Opyd, Ilene Qua, MD   40 mg at 04/04/17 0919  . [MAR Hold] potassium chloride SA (K-DUR,KLOR-CON) CR tablet 20 mEq  20 mEq Oral Daily Regalado, Belkys A, MD   20 mEq at 04/04/17 0919  . [MAR Hold] saccharomyces boulardii (FLORASTOR) capsule 250 mg  250 mg Oral BID Opyd, Ilene Qua, MD   250 mg at 04/04/17 0918  . [MAR Hold] senna-docusate (Senokot-S) tablet 1 tablet  1 tablet Oral QHS PRN Opyd, Ilene Qua, MD      . Doug Sou Hold] sodium chloride flush (NS) 0.9 % injection 3 mL  3 mL Intravenous Q12H Opyd, Ilene Qua, MD   3 mL at 03/31/17 0117  . [MAR Hold] sodium chloride flush (NS) 0.9 % injection 3 mL  3 mL Intravenous Q12H Opyd, Ilene Qua, MD   3 mL at 04/04/17 0921  . [MAR Hold] sodium chloride flush (NS) 0.9 % injection 3 mL  3 mL Intravenous PRN Opyd, Ilene Qua, MD      . sodium chloride flush (NS) 0.9 % injection 3 mL  3 mL Intravenous Q12H Arnoldo Lenis, MD   3 mL at 04/04/17 0921  . sodium chloride flush (NS) 0.9 % injection 3 mL  3 mL Intravenous PRN Arnoldo Lenis, MD      . Doug Sou Hold] traMADol Veatrice Bourbon) tablet 50 mg  50  mg Oral Q6H PRN Opyd, Ilene Qua, MD      . Doug Sou Hold] traZODone (DESYREL) tablet 50 mg  50 mg Oral QHS Opyd, Ilene Qua, MD   50 mg at 04/03/17 2151    Prescriptions Prior to Admission  Medication Sig Dispense Refill Last Dose  . albuterol (PROVENTIL HFA;VENTOLIN HFA) 108 (90 BASE) MCG/ACT inhaler Inhale 2 puffs into the lungs 2 (two) times daily. 0900 & 1700   03/30/2017 at 0928  . allopurinol (ZYLOPRIM) 100 MG tablet Take 100 mg by mouth daily.    03/30/2017 at 0928  . aspirin 81 MG chewable tablet Chew 81 mg by mouth daily.   03/30/2017 at 0928  . cholecalciferol (VITAMIN D) 1000 units tablet Take 1,000 Units by mouth daily. (0900)   03/30/2017 at 0928  .  clopidogrel (PLAVIX) 75 MG tablet Take 1 tablet (75 mg total) by mouth daily with breakfast. 30 tablet 6 03/30/2017 at 0928  . gabapentin (NEURONTIN) 100 MG capsule Take 200 mg by mouth at bedtime.    03/29/2017 at 2210  . levothyroxine (SYNTHROID, LEVOTHROID) 25 MCG tablet Take 12.5 mcg by mouth daily at 6 (six) AM.    03/30/2017 at 0546  . meloxicam (MOBIC) 7.5 MG tablet Take 7.5 mg by mouth daily.    03/30/2017 at 0928  . Multiple Vitamins-Minerals (MULTIVITAMIN WITH MINERALS) tablet Take 1 tablet by mouth daily. (0900)   03/30/2017 at 0928  . omeprazole (PRILOSEC) 20 MG capsule Take 20 mg by mouth daily at 6 (six) AM.    03/30/2017 at 0546  . saccharomyces boulardii (FLORASTOR) 250 MG capsule Take 250 mg by mouth 2 (two) times daily.   03/30/2017 at 0928  . traMADol (ULTRAM) 50 MG tablet Take 50 mg by mouth daily.    03/30/2017 at 0928  . traZODone (DESYREL) 50 MG tablet Take 50 mg by mouth at bedtime.    03/29/2017 at 2210  . guaiFENesin (MUCINEX) 600 MG 12 hr tablet Take 1,200 mg by mouth 2 (two) times daily as needed for cough.   unknown  . ipratropium-albuterol (DUONEB) 0.5-2.5 (3) MG/3ML SOLN Take 3 mLs by nebulization 4 (four) times daily as needed (for shortness of breath).   unknown  . loperamide (IMODIUM A-D) 2 MG tablet Take 2-4 mg by mouth  every 4 (four) hours as needed for diarrhea or loose stools (Do not give if <3 stools or DX. w/c. diff or norovirus. Do not exceed 16 mg/24hrs.).    unknown  . methocarbamol (ROBAXIN) 500 MG tablet Take 500 mg by mouth every 6 (six) hours as needed for muscle spasms.    unknown  . oxyCODONE (OXY IR/ROXICODONE) 5 MG immediate release tablet Take 1-2 tablets (5-10 mg total) by mouth every 4 (four) hours as needed (20m for moderate pain, 162mfor severe pain). 15 tablet 0 unknown    Family History  Problem Relation Age of Onset  . Heart failure Father   . Heart attack Father   . Heart failure Brother     Review of Systems  Constitutional: Positive for malaise/fatigue. Negative for chills, diaphoresis, fever and weight loss.  HENT: Negative for congestion, ear discharge, ear pain, hearing loss, nosebleeds, sinus pain, sore throat and tinnitus.   Eyes: Negative.        Previous cateracts  Respiratory: Positive for cough, sputum production and shortness of breath. Negative for hemoptysis, wheezing and stridor.   Cardiovascular: Positive for orthopnea and leg swelling. Negative for chest pain and palpitations.  Gastrointestinal: Positive for constipation. Negative for abdominal pain, blood in stool, diarrhea, heartburn, melena, nausea and vomiting.  Genitourinary: Positive for frequency and urgency. Negative for dysuria, flank pain and hematuria.  Musculoskeletal: Positive for myalgias. Negative for back pain, falls, joint pain and neck pain.        +Arthritis in hands  Skin: Negative.   Neurological: Positive for weakness. Negative for tingling, tremors, sensory change, speech change, focal weakness, seizures, loss of consciousness and headaches.  Endo/Heme/Allergies: Negative for environmental allergies and polydipsia. Bruises/bleeds easily.  Psychiatric/Behavioral: Positive for depression and memory loss. Negative for hallucinations, substance abuse and suicidal ideas. The patient has insomnia.  The patient is not nervous/anxious.     Physical Exam  Constitutional: He appears cachectic. He appears chronically ill.  HENT:  Nose: No nasal discharge.  Mouth/Throat:  Oropharynx is clear. Pharynx is normal.  edentulous  Eyes: Pupils are equal, round, and reactive to light. Conjunctivae are normal.  Neck: No JVD present. No neck adenopathy.  Cardiovascular: Regular rhythm, S1 normal, S2 normal and normal heart sounds.  Exam reveals no S3 and no S4.   No murmur heard. Pulses:      Carotid pulses are 1+ on the right side, and 1+ on the left side.      Radial pulses are 1+ on the left side.       Dorsalis pedis pulses are 0 on the left side.       Posterior tibial pulses are 0 on the left side.  Pulmonary/Chest: He has no wheezes. He has no rales. He exhibits no tenderness.  Dim in right base, fair air exchange throughout  Abdominal: He exhibits distension. He exhibits no mass. There is no hepatomegaly. There is no tenderness. No hernia.  Musculoskeletal:  Exterm. With reduced ROM, + R BKA, L TMA  Neurological: He is alert and oriented to person, place, and time.  Confined to bed or wheelchair  Skin: Skin is warm and dry. No rash noted. No cyanosis. No jaundice. Nails show no clubbing.      Diagnostic Studies & Laboratory data:     Recent Radiology Findings:   No results found.   I have independently reviewed the above radiologic studies.  Recent Lab Findings: Lab Results  Component Value Date   WBC 9.3 04/04/2017   HGB 9.4 (L) 04/04/2017   HCT 29.1 (L) 04/04/2017   PLT 427 (H) 04/04/2017   GLUCOSE 74 04/04/2017   CHOL 116 04/03/2017   TRIG 145 04/03/2017   HDL 20 (L) 04/03/2017   LDLCALC 67 04/03/2017   ALT 10 (L) 03/30/2017   AST 17 03/30/2017   NA 137 04/04/2017   K 4.1 04/04/2017   CL 99 (L) 04/04/2017   CREATININE 1.17 04/04/2017   BUN 19 04/04/2017   CO2 30 04/04/2017   TSH 7.110 (H) 04/02/2017   INR 1.21 04/04/2017   HGBA1C 5.4 07/23/2016    Conclusion     LM lesion, 60 %stenosed. Calcified concentric.  Prox Cx lesion, 95 %stenosed. Mid Cx to Dist Cx lesion, 50 %stenosed.  Ramus lesion, 95 %stenosed.  Ost LAD lesion, 40 %stenosed.  Prox RCA lesion, 95 %stenosed.- likely non-dominant small caliber vessel.  There is severe left ventricular systolic dysfunction. The left ventricular ejection fraction is less than 25% by visual estimate.  LV end diastolic pressure is mildly elevated, but mosty normal Right Heart Pressures - indicates adequate diuresis.  There is moderate (3+) mitral regurgitation.   The patient has visible calcification on his proximal right and left coronary artery system. There is distal Left Main stenosis with severe disease in the proximal Circumflex and proximal Ramus Intermedius. The LAD itself is a relatively good target as is the distal Circumflex and Ramus.  Ideally, he would be best served with CABG however understandably may not be the greatest of CABG candidate. If he has turned down for bypass surgery, would need to discuss with interventional colleagues as it appears he may potentially need atherectomy base PCI of at least the Circumflex and Ramus lesions. If the decision is made to do Left Main stenting, with and without atherectomy, he would likely require left ventricular support given his reduced ejection fraction.  For now he'll be transferred to [facility], and restarted on IV heparin until a decision is made  about possible CABG. I have stopped Plavix.  Can convert to PO diuretic.    Glenetta Hew, M.D., M.S. Interventional Cardiologist   Pager # [phone] Phone # [phone] [address] [place], [place] [address]    Procedural Details/Technique   Technical Details PCP: Patient, No Pcp Per Primary Cardiologist: Dr. Gwenlyn Found for PVD  Referring MD: Dr. Tyrell Antonio  76 y.o. male with a hx of PAD, former tobacco abuse, COPD,  hypothyroidism but no known CAD who is being seen for the evaluation of dyspnea and LV systolic dysfunction - acute on chronic combined systolic and diastolic dysfunction. He has been aggressively diuresed & now is referred for Right & Left Heart Catheterization to assess for likely Ischemic Cardiomyopathy.  Time Out: Verified patient identification, verified procedure, site/side was marked, verified correct patient position, special equipment/implants available, medications/allergies/relevent history reviewed, required imaging and test results available. Performed.   Access:  * Right Radial Artery: 6 Fr sheath -- Seldinger technique using Micropuncture Kit under Direct Ultrasound Guidance -- 10 mL radial cocktail IA; 4000 Units IV Heparin * Right Brachial/Antecubital Vein: an 18-gauge IV was placed in the Cath Lab by the cath lab staff under sterile technique. Once draped, the IV was exchanged over a wire for a 5Fr Glide sheath  Right Heart Catheterization: 5 Fr Gordy Councilman catheter advanced under fluoroscopy with balloon inflated to the RA, RV, then PCWP-PA for hemodynamic measurement.  * Simultaneous FA & PA blood gases checked for SaO2% to calculate FICK CO/CI  * Thermodilution Injections performed to calculate CO/CI  Simultaneous PCWP/LV & RV/LV pressures monitored with Angled Pigtail in LV.  * Catheter removed completely out of the body with balloon deflated.  Left Heart Catheterization: 5 Fr JR4 Catheter advanced & exchanged over a J-wire under direct fluoroscopic guidance into the ascending aorta. * LV Hemodynamics (LV Gram): JR 4 Catheter * Right Coronary Artery Cineangiography: JR 4 Catheter  * Right Coronary Artery Cineangiography: JL 3.5 Catheter   Upon completion of Angiogaphy, the catheter was removed completely out of the body over a wire, without complication.  Brachial Sheath(s) removed in the Cath Lab with manual pressure for hemostasis.   Radial sheath removed in the  Cardiac Catheterization lab with TR Band placed for hemostasis.  TR Band: 1305 Hours; 15 mL air -- reduced in Cath Lab to 9 mL as of 1335 hr.  MEDICATIONS * SQ Lidocaine 14m for Radial & 1 mL for Brachial * Radial Cocktail: 3 mg Verapmil in 10 mL NS * Isovue Contrast: 90 mL * Heparin: 4000 Units   Estimated blood loss <50 mL.  During this procedure the patient was administered the following to achieve and maintain moderate conscious sedation: Versed 0.5 mg, Fentanyl 12.5 mcg, while the patient's heart rate, blood pressure, and oxygen saturation were continuously monitored. The period of conscious sedation was 49 minutes, of which I was present face-to-face 100% of this time.    Complications   Complications documented before study signed (04/04/2017 1:39 PM EDT)    No complications were associated with this study.  Documented by HLeonie Man MD - 04/04/2017 1:24 PM EDT    Coronary Findings   Dominance: Left  Left Main  Vessel is large.  LM lesion, 60% stenosed. The lesion is severely calcified.  Left Anterior Descending  Vessel is moderate in size.  Ost LAD lesion, 40% stenosed. The lesion is tubular, concentric and smooth.  First Diagonal Branch  Vessel is small in size.  First Septal [employer]  Vessel is moderate in size.  Second Septal Branch  Vessel is small in size.  Third Septal Branch  Vessel is small in size.  Ramus Intermedius  Vessel is moderate in size.  Ramus lesion, 95% stenosed. The lesion is focal and eccentric.  Left Circumflex  Vessel is large.  Ost Cx to Prox Cx lesion, 10% stenosed. The lesion is moderately calcified.  Prox Cx lesion, 95% stenosed. The lesion is focal, eccentric and irregular. The lesion is moderately calcified.  Mid Cx to Dist Cx lesion, 50% stenosed. The lesion is located proximal to the major branch and eccentric.  First Obtuse Marginal Branch  Vessel is moderate in size.  Lateral First Obtuse Marginal Branch  Vessel is small  in size.  Fourth Obtuse Marginal Branch  Vessel is moderate in size. There is mild disease in the vessel.  Left Posterior Descending Artery  Vessel is moderate in size. There is mild disease in the vessel.  First Left Posterolateral Branch  Vessel is small in size.  Right Coronary Artery  Vessel is small.  Prox RCA lesion, 95% stenosed. The lesion is located at the bend. The lesion is mildly calcified.  Right Heart   Right Heart Pressures PAP/mean: 31/8/17 mmHg PCWP: 22 mmHg Elevated LV EDP consistent with volume overload. LVP/EDP: 118/4/12 mmHg AoP/MAP: 118/55/78 mmHg CO/CI FICK: 5.54 / 2.81 mmHg PA Sat 58%, Ao Sat 95%    Right Atrium Right atrial pressure is normal. Mean RAP 4 mmHg    Right Ventricle RVP/EDP 28/0/3 mmHg    Wall Motion              Left Heart   Left Ventricle The left ventricle is dilated. There is severe left ventricular systolic dysfunction. LV end diastolic pressure is mildly elevated. The left ventricular ejection fraction is less than 25% by visual estimate. There are LV function abnormalities due to global hypokinesis. There is mild to moderate mitral regurgitation.    Mitral Valve There is moderate (3+) mitral regurgitation.    Aortic Valve There is no aortic valve stenosis. The aortic valve is calcified.    Coronary Diagrams   Diagnostic Diagram       Implants     No implant documentation for this case.  PACS Images   Show images for CARDIAC CATHETERIZATION   Link to Procedure Log   Procedure Log    Hemo Data    Most Recent Value  Fick Cardiac Output 5.54 L/min  Fick Cardiac Output Index 2.81 (L/min)/BSA  RA A Wave 5 mmHg  RA V Wave 4 mmHg  RA Mean 4 mmHg  RV Systolic Pressure 28 mmHg  RV Diastolic Pressure 0 mmHg  RV EDP 3 mmHg  PA Systolic Pressure 31 mmHg  PA Diastolic Pressure 8 mmHg  PA Mean 17 mmHg  PW A Wave 25 mmHg  PW V Wave 28 mmHg  PW Mean 24 mmHg  AO Systolic Pressure 546 mmHg  AO Diastolic Pressure 55 mmHg   AO Mean 79 mmHg  LV Systolic Pressure 568 mmHg  LV Diastolic Pressure 4 mmHg  LV EDP 11 mmHg  Arterial Occlusion Pressure Extended Systolic Pressure 127 mmHg  Arterial Occlusion Pressure Extended Diastolic Pressure 55 mmHg  Arterial Occlusion Pressure Extended Mean Pressure 78 mmHg  Left Ventricular Apex Extended Systolic Pressure 517 mmHg  Left Ventricular Apex Extended Diastolic Pressure 5 mmHg  Left Ventricular Apex Extended EDP Pressure 13 mmHg  QP/QS 1  TPVR Index 6.05 HRUI  TSVR Index 28.1 HRUI  TPVR/TSVR Ratio 0.22    Result status: Final result                              *Blackey*                  *[facility] [employer].                        [place], [place] [address]                            [phone]  ------------------------------------------------------------------- Transthoracic Echocardiography  Patient:    Messiah, Ahr MR #:       [id number] Study Date: 03/31/2017 Gender:     M Age:        68 Height:     193 cm Weight:     80 kg BSA:        2.06 m^2 Pt. Status: Room:       Clovis, Belkys A  ORDERING     Regalado, Belkys A  REFERRING    Regalado, Belkys A  SONOGRAPHER  Donata Clay  PERFORMING   [facility], Inpatient  ADMITTING    Opyd, Christia Reading S  cc:  ------------------------------------------------------------------- LV EF: 30% -   35%  ------------------------------------------------------------------- Indications:      Dyspnea 786.09.  ------------------------------------------------------------------- History:   PMH:   Chronic obstructive pulmonary disease.  ------------------------------------------------------------------- Study Conclusions  - Left ventricle: The cavity size was normal. Wall thickness was   normal. Systolic function was moderately to severely reduced. The   estimated ejection fraction was in the range of 30% to 35%.   Features  are consistent with a pseudonormal left ventricular   filling pattern, with concomitant abnormal relaxation and   increased filling pressure (grade 2 diastolic dysfunction). - Aortic valve: There was very mild stenosis. Valve area (VTI):   1.15 cm^2. Valve area (Vmax): 1.08 cm^2. Valve area (Vmean): 1.04   cm^2. - Mitral valve: There was moderate regurgitation. - Left atrium: The atrium was severely dilated. - Right ventricle: Systolic function was mildly reduced. - Right atrium: The atrium was moderately to severely dilated. - Tricuspid valve: There was mild-moderate regurgitation. - Pulmonary arteries: Systolic pressure was moderately increased.   PA peak pressure: 57 mm Hg (S).  ------------------------------------------------------------------- Study data:  Comparison was made to the study of 06/24/2015.  Study status:  Routine.  Procedure:  The patient reported no pain pre or post test. Transthoracic echocardiography. Image quality was suboptimal. The study was technically difficult, as a result of poor acoustic windows.  Study completion:  There were no complications.          Transthoracic echocardiography.  M-mode, complete 2D, spectral Doppler, and color Doppler.  Birthdate: Patient birthdate: Nov 08, 1940.  Age:  Patient is 76 yr old.  Sex: Gender: male.    BMI: 21.5 kg/m^2.  Blood pressure:     138/71 Patient status:  Inpatient.  Study date:  Study date: 03/31/2017. Study time: 02:27 PM.  Location:  Bedside.  -------------------------------------------------------------------  ------------------------------------------------------------------- Left ventricle:  The cavity size was normal. Wall thickness was normal. Systolic function was moderately to severely reduced. The estimated  ejection fraction was in the range of 30% to 35%. Features are consistent with a pseudonormal left ventricular filling pattern, with concomitant abnormal relaxation and increased filling  pressure (grade 2 diastolic dysfunction).  ------------------------------------------------------------------- Aortic valve:   Moderately thickened, moderately calcified leaflets.  Doppler:   There was very mild stenosis.      VTI ratio of LVOT to aortic valve: 0.3. Valve area (VTI): 1.15 cm^2. Indexed valve area (VTI): 0.56 cm^2/m^2. Peak velocity ratio of LVOT to aortic valve: 0.28. Valve area (Vmax): 1.08 cm^2. Indexed valve area (Vmax): 0.52 cm^2/m^2. Mean velocity ratio of LVOT to aortic valve: 0.27. Valve area (Vmean): 1.04 cm^2. Indexed valve area (Vmean): 0.51 cm^2/m^2.    Mean gradient (S): 9 mm Hg. Peak gradient (S): 15 mm Hg.  ------------------------------------------------------------------- Aorta:  Aortic root: The aortic root was normal in size. Ascending aorta: The ascending aorta was normal in size.  ------------------------------------------------------------------- Mitral valve:   Mildly thickened leaflets .  Doppler:  There was moderate regurgitation.    Peak gradient (D): 3 mm Hg.  ------------------------------------------------------------------- Left atrium:  The atrium was severely dilated.  ------------------------------------------------------------------- Right ventricle:  The cavity size was normal. Systolic function was mildly reduced.  ------------------------------------------------------------------- Pulmonic valve:    The valve appears to be grossly normal. Doppler:  There was no significant regurgitation.  ------------------------------------------------------------------- Tricuspid valve:   The valve appears to be grossly normal. Doppler:  There was mild-moderate regurgitation.  ------------------------------------------------------------------- Pulmonary artery:   Systolic pressure was moderately increased.  ------------------------------------------------------------------- Right atrium:  The atrium was moderately to severely  dilated.  ------------------------------------------------------------------- Pericardium:  There was no pericardial effusion.  ------------------------------------------------------------------- Systemic veins: Inferior vena cava: The vessel was dilated. The respirophasic diameter changes were blunted (< 50%), consistent with elevated central venous pressure.  ------------------------------------------------------------------- Measurements   Left ventricle                           Value          Reference  LV ID, ED, PLAX chordal          (H)     60.2  mm       43 - 52  LV ID, ES, PLAX chordal          (H)     50.2  mm       23 - 38  LV fx shortening, PLAX chordal   (L)     17    %        >=29  LV PW thickness, ED                      11.9  mm       ----------  IVS/LV PW ratio, ED                      0.94           <=1.3  Stroke volume, 2D                        53    ml       ----------  Stroke volume/bsa, 2D                    26    ml/m^2   ----------  LV ejection fraction, 1-p A4C            22    %        ----------  LV e&', lateral                           14.8  cm/s     ----------  LV E/e&', lateral                         5.86           ----------  LV e&', medial                            8.59  cm/s     ----------  LV E/e&', medial                          10.1           ----------  LV e&', average                           11.7  cm/s     ----------  LV E/e&', average                         7.42           ----------    Ventricular septum                       Value          Reference  IVS thickness, ED                        11.2  mm       ----------    LVOT                                     Value          Reference  LVOT ID, S                               22    mm       ----------  LVOT area                                3.8   cm^2     ----------  LVOT peak velocity, S                    55    cm/s     ----------  LVOT mean velocity, S                     39.3  cm/s     ----------  LVOT VTI, S                              13.9  cm       ----------    Aortic valve                             Value  Reference  Aortic valve peak velocity, S            194   cm/s     ----------  Aortic valve mean velocity, S            143   cm/s     ----------  Aortic valve VTI, S                      45.9  cm       ----------  Aortic mean gradient, S                  9     mm Hg    ----------  Aortic peak gradient, S                  15    mm Hg    ----------  VTI ratio, LVOT/AV                       0.3            ----------  Aortic valve area, VTI                   1.15  cm^2     ----------  Aortic valve area/bsa, VTI               0.56  cm^2/m^2 ----------  Velocity ratio, peak, LVOT/AV            0.28           ----------  Aortic valve area, peak velocity         1.08  cm^2     ----------  Aortic valve area/bsa, peak              0.52  cm^2/m^2 ----------  velocity  Velocity ratio, mean, LVOT/AV            0.27           ----------  Aortic valve area, mean velocity         1.04  cm^2     ----------  Aortic valve area/bsa, mean              0.51  cm^2/m^2 ----------  velocity    Aorta                                    Value          Reference  Aortic root ID, ED                       33    mm       ----------    Left atrium                              Value          Reference  LA ID, A-P, ES                           59    mm       ----------  LA ID/bsa, A-P                   (H)     2.86  cm/m^2   <=2.2  LA volume, ES, 1-p A4C                   146   ml       ----------  LA volume/bsa, ES, 1-p A4C               70.8  ml/m^2   ----------    Mitral valve                             Value          Reference  Mitral E-wave peak velocity              86.8  cm/s     ----------  Mitral A-wave peak velocity              34.9  cm/s     ----------  Mitral deceleration time         (H)     296   ms       150 - 230  Mitral peak gradient, D                   3     mm Hg    ----------  Mitral E/A ratio, peak                   2.5            ----------    Pulmonary arteries                       Value          Reference  PA pressure, S, DP               (H)     57    mm Hg    <=30    Tricuspid valve                          Value          Reference  Tricuspid regurg peak velocity           351   cm/s     ----------  Tricuspid peak RV-RA gradient            49    mm Hg    ----------    Right atrium                             Value          Reference  RA ID, S-I, ES, A4C              (H)     58.1  mm       34 - 49  RA area, ES, A4C                 (H)     28.5  cm^2     8.3 - 19.5  RA volume, ES, A/L                       109   ml       ----------  RA volume/bsa, ES, A/L  52.8  ml/m^2   ----------    Systemic veins                           Value          Reference  Estimated CVP                            8     mm Hg    ----------    Right ventricle                          Value          Reference  RV ID, minor axis, ED, A4C base          45.1  mm       ----------  RV ID, minor axis, ED, A4C mid           38    mm       ----------  RV ID, major axis, ED, A4C               79.4  mm       55 - 91  TAPSE                                    18.6  mm       ----------  RV pressure, S, DP               (H)     57    mm Hg    <=30  RV s&', lateral, S                        12.3  cm/s     ----------  Legend: (L)  and  (H)  mark values outside specified reference range.  ------------------------------------------------------------------- Prepared and Electronically Authenticated by  Mertie Moores, M.D. 2018-09-13T17:23:06  PACS Images   Show images for ECHOCARDIOGRAM COMPLETE  Patient Information   Patient Name Joaquin, Knebel Sex Male DOB 11-17-1940 SSN [id number]  Reason For Exam  Priority: Routine  Not on file  Surgical History   Surgical History   No past medical history on file.    Other Surgical  History   Procedure Laterality Date Comment Source  ABDOMINAL AORTOGRAM W/LOWER EXTREMITY N/A 09/03/2016 Procedure: Abdominal Aortogram w/Lower Extremity; Surgeon: Elam Dutch, MD; Location: [place] [facility]; Service: Cardiovascular; Laterality: N/A; Lt. leg Provider  ABDOMINAL AORTOGRAM W/LOWER EXTREMITY N/A 11/10/2016 Procedure: Abdominal Aortogram w/Lower Extremity; Surgeon: Waynetta Sandy, MD; Location: [place] [facility]; Service: Cardiovascular; Laterality: N/A; Provider  AMPUTATION Right 03/01/2014 Procedure: AMPUTATION RAY; Surgeon: Newt Minion, MD; Location: [place]; Service: Orthopedics; Laterality: Right; Right Foot 5th Ray Amputation Provider  AMPUTATION Right 04/26/2014 Procedure: Right Foot 4th Ray Amputation; Surgeon: Newt Minion, MD; Location: [place]; Service: Orthopedics; Laterality: Right; Provider  AMPUTATION Right 10/01/2015 Procedure: Right Below Knee Amputation; Surgeon: Newt Minion, MD; Location: [place]; Service: Orthopedics; Laterality: Right; Provider  AMPUTATION Left 10/08/2016 Procedure: Left Transmetatarsal Amputation; Surgeon: Newt Minion, MD; Location: [place]; Service: Orthopedics; Laterality: Left; Provider  AMPUTATION Left 07/23/2016 Procedure: Left 2nd Toe Amputation; Surgeon: Newt Minion, MD; Location: [place]; Service: Orthopedics; Laterality: Left; Provider  ANKLE  ARTHROSCOPY Right 08/13/2015 Procedure: ANKLE ARTHROSCOPY; Surgeon: Newt Minion, MD; Location: [place]; Service: Orthopedics; Laterality: Right; Provider  CATARACT EXTRACTION W/ INTRAOCULAR LENS IMPLANT, BILATERAL Bilateral   Provider  INGUINAL HERNIA REPAIR Left   Provider  IR THORACENTESIS ASP PLEURAL SPACE W/IMG GUIDE  03/18/2017  Provider  KNEE CARTILAGE SURGERY Left 1960's football injury Provider  KNEE LIGAMENT RECONSTRUCTION Left 1960's  Provider  LOWER EXTREMITY ANGIOGRAM N/A 05/27/2014 Procedure: LOWER EXTREMITY ANGIOGRAM; Surgeon: Lorretta Harp,  MD; Location: [facility]; Service: Cardiovascular; Laterality: N/A; Provider  LOWER EXTREMITY ANGIOGRAPHY Left 09/15/2016 Procedure: Lower Extremity Angiography; Surgeon: Waynetta Sandy, MD; Location: [place] [facility]; Service: Cardiovascular; Laterality: Left; Provider  PERIPHERAL VASCULAR ATHERECTOMY  11/10/2016 Procedure: Peripheral Vascular Atherectomy; Surgeon: Waynetta Sandy, MD; Location: [place] [facility]; Service: Cardiovascular;; Provider  PERIPHERAL VASCULAR INTERVENTION Left 09/15/2016 Procedure: Peripheral Vascular Intervention; Surgeon: Waynetta Sandy, MD; Location: [place] [facility]; Service: Cardiovascular; Laterality: Left; SFA/popiteal Provider  PERIPHERAL VASCULAR INTERVENTION  11/10/2016 Archie Endo 11/10/2016 Provider  PERIPHERAL VASCULAR INTERVENTION  11/10/2016 Procedure: Peripheral Vascular Intervention; Surgeon: Waynetta Sandy, MD; Location: [place] [facility]; Service: Cardiovascular;; Provider  REFRACTIVE SURGERY Bilateral   Provider  STUMP REVISION Right 12/05/2015 Procedure: Revision Right Below Knee Amputation; Surgeon: Newt Minion, MD; Location: [place]; Service: Orthopedics; Laterality: Right; Provider  STUMP REVISION Left 11/19/2016 Procedure: Revision Left Transmetatarsal Amputation; Surgeon: Newt Minion, MD; Location: [place]; Service: Orthopedics; Laterality: Left; Provider      Assessment / Plan:  76 year old male with multiple comorbidities that would make him an extremely poor candidate for CABG. He does not appear to have any venous conduit that would be acceptable for bypass. I have reviewed with the patient the risk and options of bypass  The patient is not interested in proceeding with surgery nor would he be a reasonable candidate . He is willing to consider possibility of percutaneous intervention if he is deemed to be an acceptable candidate. 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

## 2017-04-04 NOTE — Interval H&P Note (Signed)
History and Physical Interval Note:  04/04/2017 11:42 AM  Caleb Hays  has presented today for surgery, with the diagnosis of SOB-Cardiomyopathy.    The various methods of treatment have been discussed with the patient and family. After consideration of risks, benefits and other options for treatment, the patient has consented to  Procedure(s): RIGHT/LEFT HEART CATH AND CORONARY ANGIOGRAPHY (N/A) with possible Percutaneous Coronary Intervention as a surgical intervention .  The patient's history has been reviewed, patient examined, no change in status, stable for surgery.  I have reviewed the patient's chart and labs.  Questions were answered to the patient's satisfaction.    Cath Lab Visit (complete for each Cath Lab visit)  Clinical Evaluation Leading to the Procedure:   ACS: No.  Non-ACS:    Anginal Classification: CCS III - CHF Sx  Anti-ischemic medical therapy: Maximal Therapy (2 or more classes of medications)  Non-Invasive Test Results: High-risk stress test findings: cardiac mortality >3%/year -- Severely Reduced LVEF.  Prior CABG: No previous CABG    Bryan Lemma

## 2017-04-04 NOTE — Progress Notes (Signed)
PT Cancellation Note  Patient Details Name: Caleb Hays MRN: [id number] DOB: Dec 05, 1940   Cancelled Treatment:    Reason Eval/Treat Not Completed: Patient at procedure or test/unavailable Will check back as schedule permits.   Quincey Quesinberry,KATHrine E 04/04/2017, 9:47 AM Zenovia Jarred, PT, DPT 04/04/2017 Pager: [phone]

## 2017-04-04 NOTE — Progress Notes (Signed)
TR BAND REMOVAL  LOCATION:    Radial right radial  DEFLATED PER PROTOCOL:   yes  TIME BAND OFF / DRESSING APPLIED:    1540/gauze and tegaderm  SITE UPON ARRIVAL:    Level 0  SITE AFTER BAND REMOVAL:    Level 0, small bruise  CIRCULATION SENSATION AND MOVEMENT:    Within Normal Limits : yes  COMMENTS:

## 2017-04-04 NOTE — Progress Notes (Signed)
Progress Note  Patient Name: Caleb Hays Date of Encounter: 04/04/2017  Primary Cardiologist: Dr. Allyson Sabal for PVD   Subjective   Pt resting in bed about to be transported to [facility] for heart cath. He denies chest pain and states he has been voiding frequently overnight. States his LE edema is much improved with lasis  Inpatient Medications    Scheduled Meds: . allopurinol  100 mg Oral Daily  . aspirin  81 mg Oral Daily  . atorvastatin  20 mg Oral q1800  . carvedilol  3.125 mg Oral BID  . cholecalciferol  1,000 Units Oral Daily  . clopidogrel  75 mg Oral Q breakfast  . furosemide  40 mg Intravenous BID  . gabapentin  200 mg Oral QHS  . ipratropium-albuterol  3 mL Nebulization QID  . levothyroxine  12.5 mcg Oral Q0600  . lisinopril  5 mg Oral Daily  . multivitamin with minerals  1 tablet Oral Daily  . pantoprazole  40 mg Oral Daily  . potassium chloride  20 mEq Oral Daily  . saccharomyces boulardii  250 mg Oral BID  . sodium chloride flush  3 mL Intravenous Q12H  . sodium chloride flush  3 mL Intravenous Q12H  . sodium chloride flush  3 mL Intravenous Q12H  . traZODone  50 mg Oral QHS   Continuous Infusions: . sodium chloride    . sodium chloride    . sodium chloride Stopped (04/04/17 0931)  . heparin Stopped (04/04/17 0930)   PRN Meds: sodium chloride, sodium chloride, acetaminophen **OR** acetaminophen, guaiFENesin, ipratropium-albuterol, methocarbamol, ondansetron **OR** ondansetron (ZOFRAN) IV, oxyCODONE, senna-docusate, sodium chloride flush, sodium chloride flush, traMADol   Vital Signs    Vitals:   04/03/17 2028 04/04/17 0500 04/04/17 0544 04/04/17 0720  BP: 123/63  127/62   Pulse: 65  (!) 55   Resp: 17  16   Temp: 97.9 F (36.6 C)  97.8 F (36.6 C)   TempSrc: Oral  Oral   SpO2: 99%  96% 95%  Weight:  151 lb 14.4 oz (68.9 kg)    Height:        Intake/Output Summary (Last 24 hours) at 04/04/17 0940 Last data filed at 04/04/17 0600  Gross per 24 hour   Intake           500.42 ml  Output             3050 ml  Net         -2549.58 ml   Filed Weights   04/02/17 0542 04/03/17 0500 04/04/17 0500  Weight: 154 lb 5.2 oz (70 kg) 154 lb 8.7 oz (70.1 kg) 151 lb 14.4 oz (68.9 kg)     Physical Exam   General: Well developed, well nourished, male appearing in no acute distress. Head: Normocephalic, atraumatic.  Neck: Supple without bruits, no JVD but difficult to assess Lungs:  Resp regular and unlabored, scattered crackles Heart: RRR, S1, S2, no S3, S4, or murmur; no rub. Abdomen: Soft, non-tender, non-distended with normoactive bowel sounds. No hepatomegaly. No rebound/guarding. No obvious abdominal masses. Extremities: No clubbing, cyanosis, trace edema. Left BKA, right partial foot amputation Neuro: Alert and oriented X 3. Moves all extremities spontaneously. Psych: Normal affect.  Labs    Chemistry Recent Labs Lab 03/30/17 1622  04/02/17 0115 04/03/17 0548 04/04/17 0536  NA 139  < > 138 137 137  K 4.5  < > 4.2 4.2 4.1  CL 104  < > 101 99* 99*  CO2  27  < > GLUCOSE 81  < > 86 74 74  BUN 20  < > CREATININE 1.17  < > 1.12 1.08 1.17  CALCIUM 8.6*  < > 8.5* 8.4* 8.3*  PROT 5.9*  --   --   --   --   ALBUMIN 3.2*  --   --   --   --   AST 17  --   --   --   --   ALT 10*  --   --   --   --   ALKPHOS 70  --   --   --   --   BILITOT 0.7  --   --   --   --   GFRNONAA 59*  < > >60 >60 59*  GFRAA >60  < > >60 >60 >60  ANIONGAP 8  < > < > = values in this interval not displayed.   Hematology Recent Labs Lab 04/02/17 0115 04/03/17 0031 04/03/17 0548 04/04/17 0536  WBC 8.4  --  9.5 9.3  RBC 4.28  --  4.47 4.17*  HGB 9.2* 9.9* 9.7* 9.4*  HCT 29.2* 31.1* 30.3* 29.1*  MCV 68.2*  --  67.8* 69.8*  MCH 21.5*  --  21.7* 22.5*  MCHC 31.5  --  32.0 32.3  RDW 18.4*  --  18.1* 18.4*  PLT 403*  --  452* 427*    Cardiac Enzymes Recent Labs Lab 03/30/17 1622 03/30/17 2350 03/31/17 0457 03/31/17 0936    TROPONINI 0.35* 0.37* 0.32* 0.29*   No results for input(s): TROPIPOC in the last 168 hours.   BNP Recent Labs Lab 03/30/17 1622  BNP 1,074.4*     DDimer No results for input(s): DDIMER in the last 168 hours.   Radiology    No results found.   Telemetry    Generally sinus with PVCs - Personally Reviewed  ECG    No new tracings - Personally Reviewed   Cardiac Studies   Echocardiogram 03/31/17: Study Conclusions - Left ventricle: The cavity size was normal. Wall thickness was   normal. Systolic function was moderately to severely reduced. The   estimated ejection fraction was in the range of 30% to 35%.   Features are consistent with a pseudonormal left ventricular   filling pattern, with concomitant abnormal relaxation and   increased filling pressure (grade 2 diastolic dysfunction). - Aortic valve: There was very mild stenosis. Valve area (VTI):   1.15 cm^2. Valve area (Vmax): 1.08 cm^2. Valve area (Vmean): 1.04   cm^2. - Mitral valve: There was moderate regurgitation. - Left atrium: The atrium was severely dilated. - Right ventricle: Systolic function was mildly reduced. - Right atrium: The atrium was moderately to severely dilated. - Tricuspid valve: There was mild-moderate regurgitation. - Pulmonary arteries: Systolic pressure was moderately increased.   PA peak pressure: 57 mm Hg (S).  Patient Profile     76 y.o. male with a hx of PAD, former tobacco abuse, COPD, hypothyroidism but no known CAD who is being seen for the evaluation of dyspnea and LV systolic dysfunction.   Assessment & Plan    1. Acute systolic and diastolic heart failure - plan left and and right heart cath today for suspected ICM, although he denies chest pain and troponins fairly flat - pt has diuresed over the weekend and is overall net negative 8.6L with 3L urine output yesterday - pt has minimal swelling  on lower extremities - continue ASA, statin, coreg, plavix, heparin drip, and  lisinopril for now   2. Pleural effusion - thoracentesis with 2L fluid removed   3. PAD - continue ASA and plavix    Signed, Marcelino Duster , PA-C 9:40 AM 04/04/2017 Pager: [phone]

## 2017-04-04 NOTE — Progress Notes (Signed)
Condom cath came off. Linen saturated w/urine. Sheets changed. Patient wants to use urinal for now.

## 2017-04-04 NOTE — Telephone Encounter (Signed)
Did receive today fax wise. Will have Dr. Lajoyce Corners sign today and fax back.

## 2017-04-04 NOTE — H&P (View-Only) (Signed)
 Progress Note  Patient Name: Caleb Hays Date of Encounter: 04/04/2017  Primary Cardiologist: Dr. Berry for PVD   Subjective   Pt resting in bed about to be transported to [facility] for heart cath. He denies chest pain and states he has been voiding frequently overnight. States his LE edema is much improved with lasis  Inpatient Medications    Scheduled Meds: . allopurinol  100 mg Oral Daily  . aspirin  81 mg Oral Daily  . atorvastatin  20 mg Oral q1800  . carvedilol  3.125 mg Oral BID  . cholecalciferol  1,000 Units Oral Daily  . clopidogrel  75 mg Oral Q breakfast  . furosemide  40 mg Intravenous BID  . gabapentin  200 mg Oral QHS  . ipratropium-albuterol  3 mL Nebulization QID  . levothyroxine  12.5 mcg Oral Q0600  . lisinopril  5 mg Oral Daily  . multivitamin with minerals  1 tablet Oral Daily  . pantoprazole  40 mg Oral Daily  . potassium chloride  20 mEq Oral Daily  . saccharomyces boulardii  250 mg Oral BID  . sodium chloride flush  3 mL Intravenous Q12H  . sodium chloride flush  3 mL Intravenous Q12H  . sodium chloride flush  3 mL Intravenous Q12H  . traZODone  50 mg Oral QHS   Continuous Infusions: . sodium chloride    . sodium chloride    . sodium chloride Stopped (04/04/17 0931)  . heparin Stopped (04/04/17 0930)   PRN Meds: sodium chloride, sodium chloride, acetaminophen **OR** acetaminophen, guaiFENesin, ipratropium-albuterol, methocarbamol, ondansetron **OR** ondansetron (ZOFRAN) IV, oxyCODONE, senna-docusate, sodium chloride flush, sodium chloride flush, traMADol   Vital Signs    Vitals:   04/03/17 2028 04/04/17 0500 04/04/17 0544 04/04/17 0720  BP: 123/63  127/62   Pulse: 65  (!) 55   Resp: 17  16   Temp: 97.9 F (36.6 C)  97.8 F (36.6 C)   TempSrc: Oral  Oral   SpO2: 99%  96% 95%  Weight:  151 lb 14.4 oz (68.9 kg)    Height:        Intake/Output Summary (Last 24 hours) at 04/04/17 0940 Last data filed at 04/04/17 0600  Gross per 24 hour   Intake           500.42 ml  Output             3050 ml  Net         -2549.58 ml   Filed Weights   04/02/17 0542 04/03/17 0500 04/04/17 0500  Weight: 154 lb 5.2 oz (70 kg) 154 lb 8.7 oz (70.1 kg) 151 lb 14.4 oz (68.9 kg)     Physical Exam   General: Well developed, well nourished, male appearing in no acute distress. Head: Normocephalic, atraumatic.  Neck: Supple without bruits, no JVD but difficult to assess Lungs:  Resp regular and unlabored, scattered crackles Heart: RRR, S1, S2, no S3, S4, or murmur; no rub. Abdomen: Soft, non-tender, non-distended with normoactive bowel sounds. No hepatomegaly. No rebound/guarding. No obvious abdominal masses. Extremities: No clubbing, cyanosis, trace edema. Left BKA, right partial foot amputation Neuro: Alert and oriented X 3. Moves all extremities spontaneously. Psych: Normal affect.  Labs    Chemistry Recent Labs Lab 03/30/17 1622  04/02/17 0115 04/03/17 0548 04/04/17 0536  NA 139  < > 138 137 137  K 4.5  < > 4.2 4.2 4.1  CL 104  < > 101 99* 99*  CO2   27  < > 29 29 30  GLUCOSE 81  < > 86 74 74  BUN 20  < > 19 18 19  CREATININE 1.17  < > 1.12 1.08 1.17  CALCIUM 8.6*  < > 8.5* 8.4* 8.3*  PROT 5.9*  --   --   --   --   ALBUMIN 3.2*  --   --   --   --   AST 17  --   --   --   --   ALT 10*  --   --   --   --   ALKPHOS 70  --   --   --   --   BILITOT 0.7  --   --   --   --   GFRNONAA 59*  < > >60 >60 59*  GFRAA >60  < > >60 >60 >60  ANIONGAP 8  < > 8 9 8  < > = values in this interval not displayed.   Hematology Recent Labs Lab 04/02/17 0115 04/03/17 0031 04/03/17 0548 04/04/17 0536  WBC 8.4  --  9.5 9.3  RBC 4.28  --  4.47 4.17*  HGB 9.2* 9.9* 9.7* 9.4*  HCT 29.2* 31.1* 30.3* 29.1*  MCV 68.2*  --  67.8* 69.8*  MCH 21.5*  --  21.7* 22.5*  MCHC 31.5  --  32.0 32.3  RDW 18.4*  --  18.1* 18.4*  PLT 403*  --  452* 427*    Cardiac Enzymes Recent Labs Lab 03/30/17 1622 03/30/17 2350 03/31/17 0457 03/31/17 0936    TROPONINI 0.35* 0.37* 0.32* 0.29*   No results for input(s): TROPIPOC in the last 168 hours.   BNP Recent Labs Lab 03/30/17 1622  BNP 1,074.4*     DDimer No results for input(s): DDIMER in the last 168 hours.   Radiology    No results found.   Telemetry    Generally sinus with PVCs - Personally Reviewed  ECG    No new tracings - Personally Reviewed   Cardiac Studies   Echocardiogram 03/31/17: Study Conclusions - Left ventricle: The cavity size was normal. Wall thickness was   normal. Systolic function was moderately to severely reduced. The   estimated ejection fraction was in the range of 30% to 35%.   Features are consistent with a pseudonormal left ventricular   filling pattern, with concomitant abnormal relaxation and   increased filling pressure (grade 2 diastolic dysfunction). - Aortic valve: There was very mild stenosis. Valve area (VTI):   1.15 cm^2. Valve area (Vmax): 1.08 cm^2. Valve area (Vmean): 1.04   cm^2. - Mitral valve: There was moderate regurgitation. - Left atrium: The atrium was severely dilated. - Right ventricle: Systolic function was mildly reduced. - Right atrium: The atrium was moderately to severely dilated. - Tricuspid valve: There was mild-moderate regurgitation. - Pulmonary arteries: Systolic pressure was moderately increased.   PA peak pressure: 57 mm Hg (S).  Patient Profile     76 y.o. male with a hx of PAD, former tobacco abuse, COPD, hypothyroidism but no known CAD who is being seen for the evaluation of dyspnea and LV systolic dysfunction.   Assessment & Plan    1. Acute systolic and diastolic heart failure - plan left and and right heart cath today for suspected ICM, although he denies chest pain and troponins fairly flat - pt has diuresed over the weekend and is overall net negative 8.6L with 3L urine output yesterday - pt has minimal swelling   on lower extremities - continue ASA, statin, coreg, plavix, heparin drip, and  lisinopril for now   2. Pleural effusion - thoracentesis with 2L fluid removed   3. PAD - continue ASA and plavix    Signed, Angela Nicole Duke , PA-C 9:40 AM 04/04/2017 Pager: [phone]  

## 2017-04-05 ENCOUNTER — Encounter (HOSPITAL_COMMUNITY): Payer: Self-pay | Admitting: Cardiology

## 2017-04-05 ENCOUNTER — Other Ambulatory Visit: Payer: Self-pay

## 2017-04-05 ENCOUNTER — Encounter (HOSPITAL_COMMUNITY)
Admission: EM | Disposition: A | Discharge status: Skilled Nursing Facility | Payer: Self-pay | Source: Home / Self Care | Attending: Internal Medicine

## 2017-04-05 DIAGNOSIS — I25118 Atherosclerotic heart disease of native coronary artery with other forms of angina pectoris: Secondary | ICD-10-CM

## 2017-04-05 DIAGNOSIS — I25110 Atherosclerotic heart disease of native coronary artery with unstable angina pectoris: Secondary | ICD-10-CM

## 2017-04-05 HISTORY — PX: CORONARY ATHERECTOMY: CATH118238

## 2017-04-05 HISTORY — PX: CORONARY STENT INTERVENTION: CATH118234

## 2017-04-05 LAB — HEPARIN LEVEL (UNFRACTIONATED)
Heparin Unfractionated: <0.1 IU/mL — ABNORMAL LOW (ref 0.30–0.70)
Heparin Unfractionated: 0.17 IU/mL — ABNORMAL LOW (ref 0.30–0.70)

## 2017-04-05 LAB — CULTURE, BODY FLUID W GRAM STAIN -BOTTLE: Culture: NO GROWTH

## 2017-04-05 LAB — CBC
HEMATOCRIT: 31.3 % — AB (ref 39.0–52.0)
Hemoglobin: 9.6 g/dL — ABNORMAL LOW (ref 13.0–17.0)
MCH: 21.4 pg — ABNORMAL LOW (ref 26.0–34.0)
MCHC: 30.7 g/dL (ref 30.0–36.0)
MCV: 69.7 fL — AB (ref 78.0–100.0)
PLATELETS: 442 10*3/uL — AB (ref 150–400)
RBC: 4.49 MIL/uL (ref 4.22–5.81)
RDW: 18.3 % — AB (ref 11.5–15.5)
WBC: 9.8 10*3/uL (ref 4.0–10.5)

## 2017-04-05 LAB — BASIC METABOLIC PANEL
Anion gap: 7 (ref 5–15)
BUN: 19 mg/dL (ref 6–20)
CALCIUM: 8.1 mg/dL — AB (ref 8.9–10.3)
CO2: 29 mmol/L (ref 22–32)
CREATININE: 1.16 mg/dL (ref 0.61–1.24)
Chloride: 100 mmol/L — ABNORMAL LOW (ref 101–111)
GFR calc Af Amer: >60 mL/min (ref >60)
GFR calc non Af Amer: 60 mL/min — ABNORMAL LOW (ref >60)
Glucose, Bld: 82 mg/dL (ref 65–99)
Potassium: 4.1 mmol/L (ref 3.5–5.1)
Sodium: 136 mmol/L (ref 135–145)

## 2017-04-05 LAB — POCT ACTIVATED CLOTTING TIME
ACTIVATED CLOTTING TIME: 279 s
Activated Clotting Time: 263 seconds
Activated Clotting Time: 263 seconds

## 2017-04-05 LAB — CULTURE, BODY FLUID-BOTTLE

## 2017-04-05 SURGERY — CORONARY ATHERECTOMY
Anesthesia: LOCAL

## 2017-04-05 MED ORDER — SODIUM CHLORIDE 0.9% FLUSH: 3.0000 mL | INTRAVENOUS | Status: DC | PRN

## 2017-04-05 MED ORDER — HYDRALAZINE HCL 20 MG/ML IJ SOLN: 5.0000 mg | INTRAMUSCULAR | Status: AC | PRN

## 2017-04-05 MED ORDER — HEPARIN (PORCINE) IN NACL 2-0.9 UNIT/ML-% IJ SOLN
INTRAMUSCULAR | Status: AC | PRN
  Administered 2017-04-05: 1000 mL

## 2017-04-05 MED ORDER — FENTANYL CITRATE (PF) 100 MCG/2ML IJ SOLN
INTRAMUSCULAR | Status: DC | PRN
  Administered 2017-04-05 (×3): 25 ug via INTRAVENOUS

## 2017-04-05 MED ORDER — HEPARIN SODIUM (PORCINE) 1000 UNIT/ML IJ SOLN
INTRAMUSCULAR | Status: DC | PRN
  Administered 2017-04-05 (×2): 2000 [IU] via INTRAVENOUS
  Administered 2017-04-05: 6000 [IU] via INTRAVENOUS
  Administered 2017-04-05 (×2): 2000 [IU] via INTRAVENOUS

## 2017-04-05 MED ORDER — IPRATROPIUM-ALBUTEROL 0.5-2.5 (3) MG/3ML IN SOLN
3.0000 mL | Freq: Two times a day (BID) | RESPIRATORY_TRACT | Status: DC
  Administered 2017-04-05 – 2017-04-06 (×2): 3 mL via RESPIRATORY_TRACT
  Filled 2017-04-05 (×2): qty 3

## 2017-04-05 MED ORDER — VERAPAMIL HCL 2.5 MG/ML IV SOLN
INTRAVENOUS | Status: AC
  Filled 2017-04-05: qty 2

## 2017-04-05 MED ORDER — LABETALOL HCL 5 MG/ML IV SOLN: 10.0000 mg | INTRAVENOUS | Status: AC | PRN

## 2017-04-05 MED ORDER — LIDOCAINE HCL (PF) 1 % IJ SOLN
INTRAMUSCULAR | Status: DC | PRN
  Administered 2017-04-05: 2 mL

## 2017-04-05 MED ORDER — MIDAZOLAM HCL 2 MG/2ML IJ SOLN
INTRAMUSCULAR | Status: AC
  Filled 2017-04-05: qty 2

## 2017-04-05 MED ORDER — LIDOCAINE HCL 2 % IJ SOLN
INTRAMUSCULAR | Status: AC
  Filled 2017-04-05: qty 10

## 2017-04-05 MED ORDER — CLOPIDOGREL BISULFATE 300 MG PO TABS
300.0000 mg | ORAL_TABLET | Freq: Every day | ORAL | Status: DC
  Administered 2017-04-05: 300 mg via ORAL
  Filled 2017-04-05: qty 1

## 2017-04-05 MED ORDER — FERROUS SULFATE 325 (65 FE) MG PO TABS
325.0000 mg | ORAL_TABLET | Freq: Two times a day (BID) | ORAL | Status: DC
  Administered 2017-04-06: 325 mg via ORAL
  Filled 2017-04-05: qty 1

## 2017-04-05 MED ORDER — SODIUM CHLORIDE 0.9 % IV SOLN
INTRAVENOUS | Status: AC
  Administered 2017-04-05: 19:00:00 via INTRAVENOUS

## 2017-04-05 MED ORDER — SODIUM CHLORIDE 0.9 % IV SOLN
INTRAVENOUS | Status: AC | PRN
  Administered 2017-04-05: 250 mL via INTRAVENOUS

## 2017-04-05 MED ORDER — NITROGLYCERIN 1 MG/10 ML FOR IR/CATH LAB
INTRA_ARTERIAL | Status: DC | PRN
  Administered 2017-04-05 (×2): 200 ug via INTRACORONARY

## 2017-04-05 MED ORDER — SODIUM CHLORIDE 0.9 % IV SOLN: 250.0000 mL | INTRAVENOUS | Status: DC | PRN

## 2017-04-05 MED ORDER — HEPARIN SODIUM (PORCINE) 1000 UNIT/ML IJ SOLN
INTRAMUSCULAR | Status: AC
  Filled 2017-04-05: qty 1

## 2017-04-05 MED ORDER — CLOPIDOGREL BISULFATE 75 MG PO TABS
75.0000 mg | ORAL_TABLET | Freq: Every day | ORAL | Status: DC
  Administered 2017-04-06: 75 mg via ORAL
  Filled 2017-04-05: qty 1

## 2017-04-05 MED ORDER — MIDAZOLAM HCL 2 MG/2ML IJ SOLN
INTRAMUSCULAR | Status: DC | PRN
  Administered 2017-04-05 (×3): 1 mg via INTRAVENOUS

## 2017-04-05 MED ORDER — VERAPAMIL HCL 2.5 MG/ML IV SOLN
INTRAVENOUS | Status: DC | PRN
  Administered 2017-04-05: 10 mL via INTRA_ARTERIAL

## 2017-04-05 MED ORDER — HEPARIN (PORCINE) IN NACL 2-0.9 UNIT/ML-% IJ SOLN
INTRAMUSCULAR | Status: AC
  Filled 2017-04-05: qty 1000

## 2017-04-05 MED ORDER — SODIUM CHLORIDE 0.9 % IV SOLN: INTRAVENOUS | Status: DC

## 2017-04-05 MED ORDER — ASPIRIN 81 MG PO CHEW: 81.0000 mg | CHEWABLE_TABLET | ORAL | Status: DC

## 2017-04-05 MED ORDER — SODIUM CHLORIDE 0.9% FLUSH
3.0000 mL | Freq: Two times a day (BID) | INTRAVENOUS | Status: DC
  Administered 2017-04-06: 3 mL via INTRAVENOUS

## 2017-04-05 MED ORDER — SODIUM CHLORIDE 0.9% FLUSH: 3.0000 mL | Freq: Two times a day (BID) | INTRAVENOUS | Status: DC

## 2017-04-05 MED ORDER — FENTANYL CITRATE (PF) 100 MCG/2ML IJ SOLN
INTRAMUSCULAR | Status: AC
  Filled 2017-04-05: qty 2

## 2017-04-05 SURGICAL SUPPLY — 23 items
BALLN MINITREK OTW 1.20X6 (BALLOONS) ×2
BALLN SAPPHIRE 2.5X20 (BALLOONS) ×2
BALLOON MINITREK OTW 1.20X6 (BALLOONS) ×1 IMPLANT
BALLOON SAPPHIRE 2.5X20 (BALLOONS) ×1 IMPLANT
CATH VISTA GUIDE 6FR XBLAD3.5 (CATHETERS) ×2 IMPLANT
CROWN DIAMONDBACK CLASSIC 1.25 (BURR) ×2 IMPLANT
DEVICE RAD COMP TR BAND LRG (VASCULAR PRODUCTS) ×2 IMPLANT
ELECT DEFIB PAD ADLT CADENCE (PAD) ×2 IMPLANT
GLIDESHEATH SLEND A-KIT 6F 22G (SHEATH) ×2 IMPLANT
GUIDEWIRE INQWIRE 1.5J.035X260 (WIRE) ×1 IMPLANT
INQWIRE 1.5J .035X260CM (WIRE) ×2
KIT ENCORE 26 ADVANTAGE (KITS) ×4 IMPLANT
KIT HEART LEFT (KITS) ×2 IMPLANT
LUBRICANT VIPERSLIDE CORONARY (MISCELLANEOUS) ×4 IMPLANT
PACK CARDIAC CATHETERIZATION (CUSTOM PROCEDURE TRAY) ×2 IMPLANT
STENT SIERRA 2.50 X 23 MM (Permanent Stent) ×2 IMPLANT
STENT SIERRA 3.00 X 23 MM (Permanent Stent) ×2 IMPLANT
TRANSDUCER W/STOPCOCK (MISCELLANEOUS) ×2 IMPLANT
TUBING CIL FLEX 10 FLL-RA (TUBING) ×2 IMPLANT
VALVE GUARDIAN II ~~LOC~~ HEMO (MISCELLANEOUS) ×2 IMPLANT
WIRE ASAHI PROWATER 300CM (WIRE) ×2 IMPLANT
WIRE MAILMAN 300CM (WIRE) ×2 IMPLANT
WIRE VIPER ADVANCE COR .012TIP (WIRE) ×2 IMPLANT

## 2017-04-05 NOTE — Progress Notes (Signed)
On call Cardiology updated on patient request.

## 2017-04-05 NOTE — Evaluation (Signed)
Physical Therapy Evaluation Patient Details Name: Caleb Hays MRN: [id number] DOB: 08-14-1940 Today's Date: 04/05/2017   History of Present Illness  Caleb Hays is a 76 y.o. 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  Clinical Impression  Pt functioning near baseline. Pt with mild deconditioning compared to baseline but was able to complete lateral transfer to drop arm chair with minimal assist. Acute PT to con't to follow.    Follow Up Recommendations SNF (return to [facility])    Equipment Recommendations  None recommended by PT    Recommendations for Other Services       Precautions / Restrictions Precautions Precautions: Fall Precaution Comments: R BKA, L transmet Restrictions Weight Bearing Restrictions: No      Mobility  Bed Mobility Overal bed mobility: Needs Assistance Bed Mobility: Rolling;Sidelying to Sit Rolling: Supervision Sidelying to sit: Min assist       General bed mobility comments: minA for LE management, v/c's to push up on rail  Transfers Overall transfer level: Needs assistance Equipment used: None Transfers: Lateral/Scoot Transfers          Lateral/Scoot Transfers: Min assist General transfer comment: minA to maintain chair position, increased time but patient able to scoot over to chair with mod v/c's. pt demo'd delayed problem solving.  Ambulation/Gait             General Gait Details: pt w/c bound  Stairs            Wheelchair Mobility    Modified Rankin (Stroke Patients Only)       Balance Overall balance assessment: Needs assistance Sitting-balance support: Feet supported;No upper extremity supported Sitting balance-Leahy Scale: Good Sitting balance - Comments: pt  able to maintain balance during lateral scoot transfer                                     Pertinent Vitals/Pain Pain Assessment: No/denies pain    Home Living Family/patient expects to be discharged to:: Skilled nursing facility                 Additional Comments: has lived in [facility] for past year    Prior Function Level of Independence: Needs assistance   Gait / Transfers Assistance Needed: pt w/c bound  ADL's / Homemaking Assistance Needed: needs minimal assist for dressing and bathing, assist more so for transfer in/out of shower        Hand Dominance   Dominant Hand: Right    Extremity/Trunk Assessment   Upper Extremity Assessment Upper Extremity Assessment: Generalized weakness    Lower Extremity Assessment Lower Extremity Assessment: Generalized weakness    Cervical / Trunk Assessment Cervical / Trunk Assessment: Normal  Communication   Communication: HOH  Cognition Arousal/Alertness: Awake/alert Behavior During Therapy: WFL for tasks assessed/performed Overall Cognitive Status: Within Functional Limits for tasks assessed                                        General Comments      Exercises     Assessment/Plan    PT Assessment Patient needs continued PT services  PT Problem List  Decreased strength;Decreased activity tolerance;Decreased balance;Decreased mobility;Decreased knowledge of use of DME;Decreased safety awareness;Decreased cognition       PT Treatment Interventions      PT Goals (Current goals can be found in the Care Plan section)  Acute Rehab PT Goals PT Goal Formulation: With patient Time For Goal Achievement: 04/19/17 Potential to Achieve Goals: Good    Frequency Min 2X/week   Barriers to discharge        Co-evaluation               AM-PAC PT "6 Clicks" Daily Activity  Outcome Measure Difficulty turning over in bed (including adjusting bedclothes, sheets and blankets)?:  A Lot Difficulty moving from lying on back to sitting on the side of the bed? : A Lot Difficulty sitting down on and standing up from a chair with arms (e.g., wheelchair, bedside commode, etc,.)?: A Lot Help needed moving to and from a bed to chair (including a wheelchair)?: A Lot Help needed walking in hospital room?: Total Help needed climbing 3-5 steps with a railing? : Total 6 Click Score: 10    End of Session Equipment Utilized During Treatment: Oxygen (2LO2 via [place]) Activity Tolerance: Patient tolerated treatment well Patient left: in chair;with call bell/phone within reach;with nursing/sitter in room Nurse Communication: Mobility status PT Visit Diagnosis: Muscle weakness (generalized) (M62.81)    Time: 2130-8657 PT Time Calculation (min) (ACUTE ONLY): 14 min   Charges:   PT Evaluation $PT Eval Low Complexity: 1 Low     PT G CodesLewis Shock, PT, DPT Pager #: [phone] Office #: [phone]   Zina Pitzer M Kem Hensen 04/05/2017, 1:24 PM

## 2017-04-05 NOTE — H&P (View-Only) (Signed)
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 Tyrone Sage.  Deemed to be a very poor surgical candidate for CABG Is scheduled for PCI today   Inpatient Medications    Scheduled Meds: . allopurinol  100 mg Oral Daily  . aspirin  81 mg Oral Daily  . atorvastatin  20 mg Oral q1800  . carvedilol  3.125 mg Oral BID  . cholecalciferol  1,000 Units Oral Daily  . clopidogrel  300 mg Oral Daily  . furosemide  40 mg Intravenous BID  . gabapentin  200 mg Oral QHS  . ipratropium-albuterol  3 mL Nebulization QID  . levothyroxine  25 mcg Oral QAC breakfast  . lisinopril  5 mg Oral Daily  . multivitamin with minerals  1 tablet Oral Daily  . pantoprazole  40 mg Oral Daily  . potassium chloride  20 mEq Oral Daily  . saccharomyces boulardii  250 mg Oral BID  . sodium chloride flush  3 mL Intravenous Q12H  . sodium chloride flush  3 mL Intravenous Q12H  . sodium chloride flush  3 mL Intravenous Q12H  . traZODone  50 mg Oral QHS   Continuous Infusions: . sodium chloride    . sodium chloride    . heparin 1,600 Units/hr (04/04/17 2332)   PRN Meds: sodium chloride, sodium chloride, acetaminophen **OR** acetaminophen, guaiFENesin, ipratropium-albuterol, methocarbamol, ondansetron **OR** ondansetron (ZOFRAN) IV, oxyCODONE, senna-docusate, sodium chloride flush, sodium chloride flush, traMADol   Vital Signs    Vitals:   04/05/17 0343 04/05/17 0739 04/05/17 0740 04/05/17 1123  BP: 126/60 (!) 100/55  97/80  Pulse: 66 63    Resp: 17  17   Temp: 98.3 F (36.8 C)  99 F (37.2 C) 97.8 F (36.6 C)  TempSrc: Oral  Oral Oral  SpO2: 100% 94%    Weight:      Height:        Intake/Output Summary (Last 24 hours) at 04/05/17 1134 Last data filed at 04/05/17 1100  Gross per 24 hour  Intake            653.5 ml  Output              800 ml    Net           -146.5 ml   Filed Weights   04/03/17 0500 04/04/17 0500 04/04/17 1835  Weight: 154 lb 8.7 oz (70.1 kg) 151 lb 14.4 oz (68.9 kg) 154 lb 8 oz (70.1 kg)    Telemetry    NSR  - Personally Reviewed  ECG     NSR  - Personally Reviewed  Physical Exam   GEN: No acute distress.   Neck: No JVD Cardiac: RRR, no murmurs, rubs, or gallops.  Respiratory: Clear to auscultation bilaterally. GI: Soft, nontender, non-distended  MS: No edema in left leg.  S/p right BKA  Neuro:  Nonfocal  Psych: Normal affect   Labs    Chemistry Recent Labs Lab 03/30/17 1622  04/03/17 0548 04/04/17 0536 04/05/17 0153  NA 139  < > 137 137 136  K 4.5  < > 4.2 4.1 4.1  CL 104  < > 99* 99* 100*  CO2 27  < > GLUCOSE 81  < > 74 74 82  BUN 20  < >  18 19 19  CREATININE 1.17  < > 1.08 1.17 1.16  CALCIUM 8.6*  < > 8.4* 8.3* 8.1*  PROT 5.9*  --   --   --   --   ALBUMIN 3.2*  --   --   --   --   AST 17  --   --   --   --   ALT 10*  --   --   --   --   ALKPHOS 70  --   --   --   --   BILITOT 0.7  --   --   --   --   GFRNONAA 59*  < > >60 59* 60*  GFRAA >60  < > >60 >60 >60  ANIONGAP 8  < > 9 8 7  < > = values in this interval not displayed.   Hematology Recent Labs Lab 04/03/17 0548 04/04/17 0536 04/05/17 0153  WBC 9.5 9.3 9.8  RBC 4.47 4.17* 4.49  HGB 9.7* 9.4* 9.6*  HCT 30.3* 29.1* 31.3*  MCV 67.8* 69.8* 69.7*  MCH 21.7* 22.5* 21.4*  MCHC 32.0 32.3 30.7  RDW 18.1* 18.4* 18.3*  PLT 452* 427* 442*    Cardiac Enzymes Recent Labs Lab 03/30/17 1622 03/30/17 2350 03/31/17 0457 03/31/17 0936  TROPONINI 0.35* 0.37* 0.32* 0.29*   No results for input(s): TROPIPOC in the last 168 hours.   BNP Recent Labs Lab 03/30/17 1622  BNP 1,074.4*     DDimer No results for input(s): DDIMER in the last 168 hours.   Radiology    No results found.  Cardiac Studies     Patient Profile     76 y.o. male with severe CAD .     Assessment & Plan    1.  Coronary  artery disease: The patient has severe coronary artery disease by heart catheter station yesterday. He has been seen by Dr. Gerhardt and was deemed to be a very poor surgical candidate. He scheduled for PCI today.  2. COPD   3.  PAD -   4. Recurrent pleural effusion.   For questions or updates, please contact [facility] Please consult [email or web address] for contact info under Cardiology/STEMI.      Signed, Lorine Iannaccone, MD  04/05/2017, 11:34 AM    

## 2017-04-05 NOTE — Clinical Social Work Note (Signed)
Clinical Social Work Assessment  Patient Details  Name: Caleb Hays MRN: [id number] Date of Birth: 01/29/1941  Date of referral:  04/05/17               Reason for consult:  Discharge Planning                Permission sought to share information with:  Facility Contact Representative Permission granted to share information::  Yes, Verbal Permission Granted  Name::        Agency::  [facility]  Relationship::     Contact Information:     Housing/Transportation Living arrangements for the past 2 months:  Skilled Nursing Facility Source of Information:  Patient, Medical Team Patient Interpreter Needed:  None Criminal Activity/Legal Involvement Pertinent to Current Situation/Hospitalization:  No - Comment as needed Significant Relationships:  Other Family Members Lives with:  Facility Resident Do you feel safe going back to the place where you live?  Yes Need for family participation in patient care:  Yes (Comment)  Care giving concerns:  Patient is a long-term resident at [facility].   Social Worker assessment / plan:  CSW met with patient. No supports at bedside. CSW introduced role and explained that discharge planning would be discussed. Patient confirmed that he is from [facility] and plans on returning once stable for discharge. No further concerns. CSW encouraged patient to contact CSW as needed. CSW will continue to follow patient for support and facilitate discharge back to SNF once medically stable.  Employment status:  Retired Insurance information:  Managed Medicare PT Recommendations:  Skilled Nursing Facility Information / Referral to community resources:  Skilled Nursing Facility  Patient/Family's Response to care:  Patient is agreeable to returning to SNF. Patient's niece supportive and involved in patient's care. Patient appreciated social work intervention.  Patient/Family's Understanding of and Emotional Response to Diagnosis, Current Treatment, and  Prognosis:  Patient has a good understanding of the reason for admission and his need to return to SNF once medically stable for discharge. He noted his plan for a cath later today. Patient appears happy with hospital care.  Emotional Assessment Appearance:  Appears stated age Attitude/Demeanor/Rapport:  Other (Pleasant) Affect (typically observed):  Accepting, Appropriate, Calm, Pleasant Orientation:  Oriented to Self, Oriented to Place, Oriented to  Time, Oriented to Situation Alcohol / Substance use:  Tobacco Use Psych involvement (Current and /or in the community):  No (Comment)  Discharge Needs  Concerns to be addressed:  Care Coordination Readmission within the last 30 days:  Yes Current discharge risk:  Dependent with Mobility Barriers to Discharge:  Continued Medical Work up   Sarah C Boswell, LCSW 04/05/2017, 2:42 PM  

## 2017-04-05 NOTE — Progress Notes (Addendum)
Patient states he was told that he was not a surgical candidate. He does not want intervention done so soon.. Wants to eat. Had made NPO after midnight for potential high risk, PCI. NP paged , patient statin gthat he didn't consent to anything for tomorrow, asking if it can be done in a week. Discussed reason for intervention soon due to amount of disease on cath. Patient will follow up with MD in am. Giving po fluids and crackers as requested

## 2017-04-05 NOTE — Progress Notes (Signed)
Patient says that his personal belongings are still at [facility], will follow up in the AM.

## 2017-04-05 NOTE — Interval H&P Note (Signed)
History and Physical Interval Note:  04/05/2017 4:57 PM  Caleb Hays  has presented today for surgery, with the diagnosis of cad  The various methods of treatment have been discussed with the patient and family. After consideration of risks, benefits and other options for treatment, the patient has consented to  Procedure(s): CORONARY ATHERECTOMY (N/A) as a surgical intervention .  The patient's history has been reviewed, patient examined, no change in status, stable for surgery.  I have reviewed the patient's chart and labs.  Questions were answered to the patient's satisfaction.     Bryan Lemma

## 2017-04-05 NOTE — Progress Notes (Signed)
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 Tyrone Sage.  Deemed to be a very poor surgical candidate for CABG Is scheduled for PCI today   Inpatient Medications    Scheduled Meds: . allopurinol  100 mg Oral Daily  . aspirin  81 mg Oral Daily  . atorvastatin  20 mg Oral q1800  . carvedilol  3.125 mg Oral BID  . cholecalciferol  1,000 Units Oral Daily  . clopidogrel  300 mg Oral Daily  . furosemide  40 mg Intravenous BID  . gabapentin  200 mg Oral QHS  . ipratropium-albuterol  3 mL Nebulization QID  . levothyroxine  25 mcg Oral QAC breakfast  . lisinopril  5 mg Oral Daily  . multivitamin with minerals  1 tablet Oral Daily  . pantoprazole  40 mg Oral Daily  . potassium chloride  20 mEq Oral Daily  . saccharomyces boulardii  250 mg Oral BID  . sodium chloride flush  3 mL Intravenous Q12H  . sodium chloride flush  3 mL Intravenous Q12H  . sodium chloride flush  3 mL Intravenous Q12H  . traZODone  50 mg Oral QHS   Continuous Infusions: . sodium chloride    . sodium chloride    . heparin 1,600 Units/hr (04/04/17 2332)   PRN Meds: sodium chloride, sodium chloride, acetaminophen **OR** acetaminophen, guaiFENesin, ipratropium-albuterol, methocarbamol, ondansetron **OR** ondansetron (ZOFRAN) IV, oxyCODONE, senna-docusate, sodium chloride flush, sodium chloride flush, traMADol   Vital Signs    Vitals:   04/05/17 0343 04/05/17 0739 04/05/17 0740 04/05/17 1123  BP: 126/60 (!) 100/55  97/80  Pulse: 66 63    Resp: 17  17   Temp: 98.3 F (36.8 C)  99 F (37.2 C) 97.8 F (36.6 C)  TempSrc: Oral  Oral Oral  SpO2: 100% 94%    Weight:      Height:        Intake/Output Summary (Last 24 hours) at 04/05/17 1134 Last data filed at 04/05/17 1100  Gross per 24 hour  Intake            653.5 ml  Output              800 ml    Net           -146.5 ml   Filed Weights   04/03/17 0500 04/04/17 0500 04/04/17 1835  Weight: 154 lb 8.7 oz (70.1 kg) 151 lb 14.4 oz (68.9 kg) 154 lb 8 oz (70.1 kg)    Telemetry    NSR  - Personally Reviewed  ECG     NSR  - Personally Reviewed  Physical Exam   GEN: No acute distress.   Neck: No JVD Cardiac: RRR, no murmurs, rubs, or gallops.  Respiratory: Clear to auscultation bilaterally. GI: Soft, nontender, non-distended  MS: No edema in left leg.  S/p right BKA  Neuro:  Nonfocal  Psych: Normal affect   Labs    Chemistry Recent Labs Lab 03/30/17 1622  04/03/17 0548 04/04/17 0536 04/05/17 0153  NA 139  < > 137 137 136  K 4.5  < > 4.2 4.1 4.1  CL 104  < > 99* 99* 100*  CO2 27  < > GLUCOSE 81  < > 74 74 82  BUN 20  < >  CREATININE 1.17  < > 1.08 1.17 1.16  CALCIUM 8.6*  < > 8.4* 8.3* 8.1*  PROT 5.9*  --   --   --   --   ALBUMIN 3.2*  --   --   --   --   AST 17  --   --   --   --   ALT 10*  --   --   --   --   ALKPHOS 70  --   --   --   --   BILITOT 0.7  --   --   --   --   GFRNONAA 59*  < > >60 59* 60*  GFRAA >60  < > >60 >60 >60  ANIONGAP 8  < > < > = values in this interval not displayed.   Hematology Recent Labs Lab 04/03/17 0548 04/04/17 0536 04/05/17 0153  WBC 9.5 9.3 9.8  RBC 4.47 4.17* 4.49  HGB 9.7* 9.4* 9.6*  HCT 30.3* 29.1* 31.3*  MCV 67.8* 69.8* 69.7*  MCH 21.7* 22.5* 21.4*  MCHC 32.0 32.3 30.7  RDW 18.1* 18.4* 18.3*  PLT 452* 427* 442*    Cardiac Enzymes Recent Labs Lab 03/30/17 1622 03/30/17 2350 03/31/17 0457 03/31/17 0936  TROPONINI 0.35* 0.37* 0.32* 0.29*   No results for input(s): TROPIPOC in the last 168 hours.   BNP Recent Labs Lab 03/30/17 1622  BNP 1,074.4*     DDimer No results for input(s): DDIMER in the last 168 hours.   Radiology    No results found.  Cardiac Studies     Patient Profile     76 y.o. male with severe CAD .     Assessment & Plan    1.  Coronary  artery disease: The patient has severe coronary artery disease by heart catheter station yesterday. He has been seen by Dr. Tyrone Sage and was deemed to be a very poor surgical candidate. He scheduled for PCI today.  2. COPD   3.  PAD -   4. Recurrent pleural effusion.   For questions or updates, please contact [facility] Please consult [email or web address] for contact info under Cardiology/STEMI.      Signed, Kristeen Miss, MD  04/05/2017, 11:34 AM

## 2017-04-05 NOTE — Progress Notes (Signed)
ANTICOAGULATION CONSULT NOTE  Pharmacy Consult for heparin  Indication: chest pain/ACS  No Known Allergies  Patient Measurements: Height:  (193 cm) Weight: 154 lb 8 oz (70.1 kg) IBW/kg (Calculated) : 86.8 Heparin Dosing Weight: 75kg  Vital Signs: Temp: 99 F (37.2 C) (09/18 0740) Temp Source: Oral (09/18 0740) BP: 100/55 (09/18 0739) Pulse Rate: 63 (09/18 0739)  Labs:  Recent Labs  04/03/17 0548  04/03/17 1617 04/04/17 0536 04/05/17 0153 04/05/17 0903  HGB 9.7*  --   --  9.4* 9.6*  --   HCT 30.3*  --   --  29.1* 31.3*  --   PLT 452*  --   --  427* 442*  --   LABPROT  --   --  14.7 15.2  --   --   INR  --   --  1.16 1.21  --   --   HEPARINUNFRC 0.12*  < >  --  <0.10* <0.10* 0.17*  CREATININE 1.08  --   --  1.17 1.16  --   < > = values in this interval not displayed.  Estimated Creatinine Clearance: 54.6 mL/min (by C-G formula based on SCr of 1.16 mg/dL).  Assessment: 20 YOM presenting to [facility] ED 9/12 with shortness of breath d/t pleural effusion and new onset systolic CHF. He has h/o PAD (s/p R BKA).  He transferred here for cath and to restart heparin 8 hours post sheath removal (removed at ~ 1:15pm).  Heparin level this morning still low after overnight adjustment. No IV or bleeding issues noted. He is not a surgical candidate, he may be a PCI candidate, patient still considering. Will adjust heparin until plan is more defined.  Goal of Therapy: Heparin level 0.3-0.7 units/ml Monitor platelets by anticoagulation protocol: Yes  Plan: -Increase heparin to 1800 units/hr, recheck tonight -Heparin level daily -Will follow procedural plans  Sheppard Coil PharmD., BCPS Clinical Pharmacist Pager [phone] 04/05/2017 10:29 AM

## 2017-04-05 NOTE — Progress Notes (Signed)
[facility] PROGRESS NOTE  Caleb Hays ZOX:[id number] DOB: [birth year] DOA: 03/30/2017 PCP: Patient, No Pcp Per  Interim Summary and HPI Caleb Hays is a 76 year old male with a medical history of COPD, congestive heart failure, hyperlipidemia, hypothyroidism, peripheral artery disease (s/p right BKA and left TMA), and recurrent pleural effusions. He presented to the ED on 03/17/17 and again on 03/30/17 for a right pleural effusion. He underwent a thoracentesis on 03/31/17 where they removed 2.2 liters of yellow fluid- cultures negative. Cardiac catheterization performed yesterday revealed calcification of proximal right and left coronary artery systems, but he is not a good candidate for CABG. Cardiology recommended atherectomy with PCI.   Assessment/Plan: 1. Recurrent right pleural effusion resulting in respiratory failure -Thoracentesis on 9/13 and culture with no growth. Antibiotics were discontinued -Likely secondary to heart failure exacerbation.  -Repeat chest xray to evaluate.   2. Coronary artery disease -Cardiac cath on 9/17 revealed EF <25%, LM 60%, proximal Cx 95%, Proximal RCA 95%, ramus 95%, Ost LAD 40%.  -Patient not a candidate for CABG and planning to undergo attempted PCI and atherectomy.  -Managed by cardiology.  -Continue heparin.   3. Acute on chronic combined systolic and diastolic heart failure -Continue lasix  BID, ACE, BB.  -BNP elevated on admission. -Echo 9/13 showed EF 30-35%, grade 2 diastolic dysfunction -Cath 9/17 showed EF <25%  4.   COPD -Continue DuoBed and supplemental O2 as needed.  -Prednisone taper  5.   Anemia of chronic disease -Hb this morning of 9.6. Continue to monitor -Transfuse as necessary  6. Peripheral artery disease -Stable. Continue aspirin and plavix  7.   Hypothyroidism -Continue levothyroxine.   8. Hyperlipidemia -Continue statin therapy.    Code Status: Full Family Communication: None at bedside Disposition  Plan: Once medically stable  Consultants:  Cardiology, CT surgery  Procedures:  Cardiac cath 04/04/17 -LM lesion, 60 %stenosed. Calcified concentric. -Prox Cx lesion, 95 %stenosed. Mid Cx to Dist Cx lesion, 50 %stenosed. -Ramus lesion, 95 %stenosed. Suezanne Jacquet LAD lesion, 40 %stenosed. -Prox RCA lesion, 95 %stenosed.- likely non-dominant small caliber vessel. -There is severe left ventricular systolic dysfunction. The left ventricular ejection fraction is less than 25% by visual estimate -LV end diastolic pressure is mildly elevated, but mosty normal Right Heart Pressures - indicates adequate diuresis. -There is moderate (3+) mitral regurgitation.   Thoracentesis 03/31/17 -2.2 L of yellow fluid   Echo 03/31/17: - EF: 30-35%, grade 2 diastolic dysfunction - Aortic valve: There was very mild stenosis.  - Mitral valve: There was moderate regurgitation. - Left atrium: The atrium was severely dilated. - Right ventricle: Systolic function was mildly reduced. - Right atrium: The atrium was moderately to severely dilated. - Tricuspid valve: There was mild-moderate regurgitation. - Pulmonary arteries: Systolic pressure was moderately increased.   PA peak pressure: 57 mm Hg (S).  Antibiotics:  None  HPI/Subjective: Patient reports shortness of breath but doesn't need supplemental O2 at this time. Denies chest pain, nausea, vomiting, or diarrhea.   Objective: Vitals:   04/05/17 0739 04/05/17 0740 04/05/17 1123 04/05/17 1149  BP: (!) 100/55  97/80   Pulse: 63     Resp:  17    Temp:  99 F (37.2 C) 97.8 F (36.6 C)   TempSrc:  Oral Oral   SpO2: 94%   99%  Weight:      Height:        Intake/Output Summary (Last 24 hours) at 04/05/17 1243 Last data filed at 04/05/17 1100  Gross per 24 hour  Intake            653.5 ml  Output              800 ml  Net           -146.5 ml   Filed Weights   04/03/17 0500 04/04/17 0500 04/04/17 1835  Weight: 70.1 kg (154 lb 8.7 oz) 68.9 kg (151 lb  14.4 oz) 70.1 kg (154 lb 8 oz)    Exam:   General:  Alert and oriented, in no acute distress. Sitting in the bed comfortably without oxygen.   Cardiovascular: Regular rate and rhythm. No murmurs, gallops, rubs.  Respiratory: normal respiratory effort. Clear to auscultation bilaterally.   Abdomen: Soft nontender. Regular bowel sounds.   Musculoskeletal: right BKA, left TMA  Data Reviewed: Basic Metabolic Panel:  Recent Labs Lab 04/01/17 0825 04/02/17 0115 04/03/17 0548 04/04/17 0536 04/05/17 0153  NA 137 138 137 137 136  K 4.5 4.2 4.2 4.1 4.1  CL 101 101 99* 99* 100*  CO2 GLUCOSE 74 86 74 74 82  BUN CREATININE 1.03 1.12 1.08 1.17 1.16  CALCIUM 8.3* 8.5* 8.4* 8.3* 8.1*   Liver Function Tests:  Recent Labs Lab 03/30/17 1622  AST 17  ALT 10*  ALKPHOS 70  BILITOT 0.7  PROT 5.9*  ALBUMIN 3.2*   CBC:  Recent Labs Lab 03/30/17 1622  04/01/17 0825 04/02/17 0115 04/03/17 0031 04/03/17 0548 04/04/17 0536 04/05/17 0153  WBC 7.5  < > 6.8 8.4  --  9.5 9.3 9.8  NEUTROABS 3.9  --   --   --   --   --   --   --   HGB 9.5*  < > 8.9* 9.2* 9.9* 9.7* 9.4* 9.6*  HCT 30.5*  < > 27.7* 29.2* 31.1* 30.3* 29.1* 31.3*  MCV 69.0*  < > 69.4* 68.2*  --  67.8* 69.8* 69.7*  PLT 396  < > 383 403*  --  452* 427* 442*  < > = values in this interval not displayed. Cardiac Enzymes:  Recent Labs Lab 03/30/17 1622 03/30/17 2350 03/31/17 0457 03/31/17 0936  TROPONINI 0.35* 0.37* 0.32* 0.29*   BNP (last 3 results)  Recent Labs  03/17/17 1725 03/30/17 1622  BNP 1,406.0* 1,074.4*    Recent Results (from the past 240 hour(s))  MRSA PCR Screening     Status: None   Collection Time: 03/31/17  6:45 AM  Result Value Ref Range Status   MRSA by PCR NEGATIVE NEGATIVE Final    Comment:        The GeneXpert MRSA Assay (FDA approved for NASAL specimens only), is one component of a comprehensive MRSA colonization surveillance program. It is  not intended to diagnose MRSA infection nor to guide or monitor treatment for MRSA infections.   Culture, body fluid-bottle     Status: None (Preliminary result)   Collection Time: 03/31/17  5:38 PM  Result Value Ref Range Status   Specimen Description PLEURAL FLUID  Final   Special Requests BOTTLES DRAWN AEROBIC AND ANAEROBIC  Final   Culture   Final    NO GROWTH 4 DAYS Performed at [facility], [address], [place], [place] [address]    Report Status PENDING  Incomplete  Gram stain     Status: None   Collection Time: 03/31/17  5:38 PM  Result Value Ref Range Status  Specimen Description PLEURAL FLUID  Final   Special Requests NONE  Final   Gram Stain   Final    CYTOSPIN SMEAR WBC PRESENT,BOTH PMN AND MONONUCLEAR NO ORGANISMS SEEN Performed at [facility], [address], [place], [place] [address]    Report Status 04/01/2017 FINAL  Final  MRSA PCR Screening     Status: Abnormal   Collection Time: 04/04/17  6:38 PM  Result Value Ref Range Status   MRSA by PCR POSITIVE (A) NEGATIVE Final    Comment:        The GeneXpert MRSA Assay (FDA approved for NASAL specimens only), is one component of a comprehensive MRSA colonization surveillance program. It is not intended to diagnose MRSA infection nor to guide or monitor treatment for MRSA infections. RESULT CALLED TO, READ BACK BY AND VERIFIED WITH: S.CORREA,RN AT 2145 BY L.PITT      Studies: No results found.  Scheduled Meds: . allopurinol  100 mg Oral Daily  . aspirin  81 mg Oral Daily  . [START ON 04/06/2017] aspirin  81 mg Oral Pre-Cath  . atorvastatin  20 mg Oral q1800  . carvedilol  3.125 mg Oral BID  . cholecalciferol  1,000 Units Oral Daily  . clopidogrel  300 mg Oral Daily  . furosemide  40 mg Intravenous BID  . gabapentin  200 mg Oral QHS  . ipratropium-albuterol  3 mL Nebulization BID  . levothyroxine  25 mcg Oral QAC breakfast  . lisinopril  5 mg Oral Daily  . multivitamin with  minerals  1 tablet Oral Daily  . pantoprazole  40 mg Oral Daily  . potassium chloride  20 mEq Oral Daily  . saccharomyces boulardii  250 mg Oral BID  . sodium chloride flush  3 mL Intravenous Q12H  . sodium chloride flush  3 mL Intravenous Q12H  . sodium chloride flush  3 mL Intravenous Q12H  . sodium chloride flush  3 mL Intravenous Q12H  . traZODone  50 mg Oral QHS   Continuous Infusions: . sodium chloride    . sodium chloride    . sodium chloride    . [START ON 04/06/2017] sodium chloride    . heparin 1,800 Units/hr (04/05/17 1159)    Time spent: 25 minutes  Ernestine Mcmurray, PA-S  [facility] If 7PM-7AM, please contact night-coverage at [email or web address], password [facility] 04/05/2017, 12:43 PM  LOS: 6 days

## 2017-04-06 ENCOUNTER — Encounter (HOSPITAL_COMMUNITY): Payer: Self-pay | Admitting: Cardiology

## 2017-04-06 ENCOUNTER — Telehealth (INDEPENDENT_AMBULATORY_CARE_PROVIDER_SITE_OTHER): Payer: Self-pay | Admitting: Orthopedic Surgery

## 2017-04-06 ENCOUNTER — Ambulatory Visit (INDEPENDENT_AMBULATORY_CARE_PROVIDER_SITE_OTHER): Payer: Medicare HMO | Admitting: Orthopedic Surgery

## 2017-04-06 DIAGNOSIS — J9611 Chronic respiratory failure with hypoxia: Secondary | ICD-10-CM

## 2017-04-06 DIAGNOSIS — R0602 Shortness of breath: Secondary | ICD-10-CM

## 2017-04-06 DIAGNOSIS — Z9889 Other specified postprocedural states: Secondary | ICD-10-CM

## 2017-04-06 DIAGNOSIS — I25110 Atherosclerotic heart disease of native coronary artery with unstable angina pectoris: Secondary | ICD-10-CM

## 2017-04-06 DIAGNOSIS — J449 Chronic obstructive pulmonary disease, unspecified: Secondary | ICD-10-CM

## 2017-04-06 DIAGNOSIS — D638 Anemia in other chronic diseases classified elsewhere: Secondary | ICD-10-CM

## 2017-04-06 DIAGNOSIS — E039 Hypothyroidism, unspecified: Secondary | ICD-10-CM

## 2017-04-06 LAB — BASIC METABOLIC PANEL
Anion gap: 8 (ref 5–15)
BUN: 20 mg/dL (ref 6–20)
CALCIUM: 7.9 mg/dL — AB (ref 8.9–10.3)
CO2: 26 mmol/L (ref 22–32)
Chloride: 101 mmol/L (ref 101–111)
Creatinine, Ser: 1.05 mg/dL (ref 0.61–1.24)
Glucose, Bld: 141 mg/dL — ABNORMAL HIGH (ref 65–99)
Potassium: 3.8 mmol/L (ref 3.5–5.1)
SODIUM: 135 mmol/L (ref 135–145)

## 2017-04-06 LAB — POCT ACTIVATED CLOTTING TIME
ACTIVATED CLOTTING TIME: 296 s
Activated Clotting Time: 246 seconds

## 2017-04-06 MED ORDER — LISINOPRIL 5 MG PO TABS: 5.0000 mg | ORAL_TABLET | Freq: Every day | ORAL | 0 refills | Status: AC

## 2017-04-06 MED ORDER — TRAMADOL HCL 50 MG PO TABS: 50.0000 mg | ORAL_TABLET | Freq: Four times a day (QID) | ORAL | 0 refills | Status: AC | PRN

## 2017-04-06 MED ORDER — FERROUS SULFATE 325 (65 FE) MG PO TABS: 325.0000 mg | ORAL_TABLET | Freq: Two times a day (BID) | ORAL | 0 refills | Status: AC

## 2017-04-06 MED ORDER — FUROSEMIDE 40 MG PO TABS: 40.0000 mg | ORAL_TABLET | Freq: Every day | ORAL | 0 refills | Status: DC

## 2017-04-06 MED ORDER — CARVEDILOL 3.125 MG PO TABS: 3.1250 mg | ORAL_TABLET | Freq: Two times a day (BID) | ORAL | 0 refills | Status: AC

## 2017-04-06 MED ORDER — POTASSIUM CHLORIDE CRYS ER 20 MEQ PO TBCR: 20.0000 meq | EXTENDED_RELEASE_TABLET | Freq: Every day | ORAL | 0 refills | Status: DC

## 2017-04-06 MED ORDER — LEVOTHYROXINE SODIUM 25 MCG PO TABS: 25.0000 ug | ORAL_TABLET | Freq: Every day | ORAL | Status: AC

## 2017-04-06 MED ORDER — OXYCODONE HCL 5 MG PO TABS: 5.0000 mg | ORAL_TABLET | ORAL | 0 refills | Status: DC | PRN

## 2017-04-06 MED ORDER — FUROSEMIDE 40 MG PO TABS: 40.0000 mg | ORAL_TABLET | Freq: Every day | ORAL | Status: DC

## 2017-04-06 MED ORDER — ATORVASTATIN CALCIUM 20 MG PO TABS: 20.0000 mg | ORAL_TABLET | Freq: Every day | ORAL | 0 refills | Status: DC

## 2017-04-06 NOTE — Progress Notes (Signed)
Progress Note  Patient Name: Caleb Hays Date of Encounter: 04/06/2017  Primary Cardiologist: Allyson Sabal  Subjective   76 yo with hx of PAD , CAD  Has severe 3 V CAD by cath yesterday  Was seen by Dr. Tyrone Sage.  Deemed to be a very poor surgical candidate for CABG  He had rotational atherectomy of his left circumflex/marginal vessel in 2 different sites yesterday. He's doing quite well today.  Inpatient Medications    Scheduled Meds: . allopurinol  100 mg Oral Daily  . aspirin  81 mg Oral Daily  . atorvastatin  20 mg Oral q1800  . carvedilol  3.125 mg Oral BID  . cholecalciferol  1,000 Units Oral Daily  . clopidogrel  75 mg Oral Q breakfast  . ferrous sulfate  325 mg Oral BID WC  . furosemide  40 mg Intravenous BID  . gabapentin  200 mg Oral QHS  . ipratropium-albuterol  3 mL Nebulization BID  . levothyroxine  25 mcg Oral QAC breakfast  . lisinopril  5 mg Oral Daily  . multivitamin with minerals  1 tablet Oral Daily  . pantoprazole  40 mg Oral Daily  . potassium chloride  20 mEq Oral Daily  . saccharomyces boulardii  250 mg Oral BID  . sodium chloride flush  3 mL Intravenous Q12H  . sodium chloride flush  3 mL Intravenous Q12H  . sodium chloride flush  3 mL Intravenous Q12H  . sodium chloride flush  3 mL Intravenous Q12H  . traZODone  50 mg Oral QHS   Continuous Infusions: . sodium chloride    . sodium chloride    . sodium chloride Stopped (04/06/17 0200)   PRN Meds: sodium chloride, sodium chloride, sodium chloride, acetaminophen **OR** acetaminophen, guaiFENesin, ipratropium-albuterol, methocarbamol, ondansetron **OR** ondansetron (ZOFRAN) IV, oxyCODONE, senna-docusate, sodium chloride flush, sodium chloride flush, sodium chloride flush, traMADol   Vital Signs    Vitals:   04/05/17 2215 04/06/17 0309 04/06/17 0727 04/06/17 0740  BP: (!) 120/57 124/66 (!) 117/48   Pulse: (!) 59 63    Resp: 16 12    Temp: 97.6 F (36.4 C) 97.8 F (36.6 C) 99 F (37.2 C)     TempSrc: Oral Oral Oral   SpO2: 97% 100%  98%  Weight:      Height:        Intake/Output Summary (Last 24 hours) at 04/06/17 0849 Last data filed at 04/06/17 0310  Gross per 24 hour  Intake            871.1 ml  Output              700 ml  Net            171.1 ml   Filed Weights   04/03/17 0500 04/04/17 0500 04/04/17 1835  Weight: 154 lb 8.7 oz (70.1 kg) 151 lb 14.4 oz (68.9 kg) 154 lb 8 oz (70.1 kg)    Telemetry    NSR  - Personally Reviewed  ECG     NSR  - Personally Reviewed  Physical Exam   GEN: No acute distress.  Chronically ill appearing man Neck: No JVD Cardiac: RR, no murmurs, rubs, or gallops.  Respiratory: Clear to auscultation bilaterally.  Limited respiratory effort  GI: Soft, nontender, non-distended  MS: no edema, s/p R BKA  Neuro:  Nonfocal  Psych: Normal affect   Labs    Chemistry Recent Labs Lab 03/30/17 1622  04/04/17 0536 04/05/17 0153 04/06/17 0127  NA  139  < > 137 136 135  K 4.5  < > 4.1 4.1 3.8  CL 104  < > 99* 100* 101  CO2 27  < > GLUCOSE 81  < > 74 82 141*  BUN 20  < > CREATININE 1.17  < > 1.17 1.16 1.05  CALCIUM 8.6*  < > 8.3* 8.1* 7.9*  PROT 5.9*  --   --   --   --   ALBUMIN 3.2*  --   --   --   --   AST 17  --   --   --   --   ALT 10*  --   --   --   --   ALKPHOS 70  --   --   --   --   BILITOT 0.7  --   --   --   --   GFRNONAA 59*  < > 59* 60* >60  GFRAA >60  < > >60 >60 >60  ANIONGAP 8  < > < > = values in this interval not displayed.   Hematology Recent Labs Lab 04/03/17 0548 04/04/17 0536 04/05/17 0153  WBC 9.5 9.3 9.8  RBC 4.47 4.17* 4.49  HGB 9.7* 9.4* 9.6*  HCT 30.3* 29.1* 31.3*  MCV 67.8* 69.8* 69.7*  MCH 21.7* 22.5* 21.4*  MCHC 32.0 32.3 30.7  RDW 18.1* 18.4* 18.3*  PLT 452* 427* 442*    Cardiac Enzymes Recent Labs Lab 03/30/17 1622 03/30/17 2350 03/31/17 0457 03/31/17 0936  TROPONINI 0.35* 0.37* 0.32* 0.29*   No results for input(s): TROPIPOC in the last 168  hours.   BNP Recent Labs Lab 03/30/17 1622  BNP 1,074.4*     DDimer No results for input(s): DDIMER in the last 168 hours.   Radiology    No results found.  Cardiac Studies     Patient Profile     76 y.o. male with severe CAD   Assessment & Plan    1. Coronary artery disease: The patient is doing well following rotational atherectomy of the proximal circumflex proximal artery and first acute marginal artery. The patient still has significant coronary artery disease that will be treated medically. No angina  2. 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 I think he can be sent back-in place tomorrow. Continue current medications. We'll transition him to oral Lasix. Continue carvedilol 3.125 g twice a day, lisinopril 5 mg a day These may be titrated as needed/tolerated  3. COPD  4. Peripheral arterial disease 5. Recurrent pleural effusion plans per internal medicine team  The patient is doing quite well. No further recommendations. I suspect he can return to the skilled nursing facility more. We will sign off. Please call for questions.  For questions or updates, please contact [facility] Please consult [email or web address] for contact info under Cardiology/STEMI.      Signed, Kristeen Miss, MD  04/06/2017, 8:49 AM

## 2017-04-06 NOTE — Discharge Instructions (Signed)
Coronary Artery Disease, Male Coronary artery disease (CAD) is a condition in which the arteries that lead to the heart (coronary arteries) become narrow or blocked. The narrowing or blockage can lead to decreased blood flow to the heart. Prolonged reduced blood flow can cause a heart attack (myocardial infarction or MI). This condition may also be called coronary heart disease. Because CAD is the leading cause of death in men, it is important to understand what causes this condition and how it is treated. What are the causes? CAD is most often caused by atherosclerosis. This is the buildup of fat and cholesterol (plaque) on the inside of the arteries. Over time, the plaque may narrow or block the artery, reducing blood flow to the heart. Plaque can also become weak and break off within a coronary artery and cause a sudden blockage. Other less common causes of CAD include:  An embolism or blood clot in a coronary artery.  A tearing of the artery (spontaneous coronary artery dissection).  An aneurysm.  Inflammation (vasculitis) in the artery wall.  What increases the risk? The following factors may make you more likely to develop this condition:  Age. Men over age 75 are at a greater risk of CAD.  Family history of CAD.  Gender. Men often develop CAD earlier in life than women.  High blood pressure (hypertension).  Diabetes.  High cholesterol levels.  Tobacco use.  Excessive alcohol use.  Lack of exercise.  A diet high in saturated and trans fats, such as fried food and processed meat.  Other possible risk factors include:  High stress levels.  Depression.  Obesity.  Sleep apnea.  What are the signs or symptoms? Many people do not have any symptoms during the early stages of CAD. As the condition progresses, symptoms may include:  Chest pain (angina). The pain can: ? Feel like a crushing or squeezing, or a tightness, pressure, fullness, or heaviness in the  chest. ? Last more than a few minutes or can stop and recur. The pain tends to get worse with exercise or stress and to fade with rest.  Pain in the arms, neck, jaw, or back.  Unexplained heartburn or indigestion.  Shortness of breath.  Nausea or vomiting.  Sudden light-headedness.  Sudden cold sweats.  Fluttering or fast heartbeat (palpitations).  How is this diagnosed? This condition is diagnosed based on:  Your family and medical history.  A physical exam.  Tests, including: ? A test to check the electrical signals in your heart (electrocardiogram). ? Exercise stress test. This looks for signs of blockage when the heart is stressed with exercise, such as running on a treadmill. ? Pharmacologic stress test. This test looks for signs of blockage when the heart is being stressed with a medicine. ? Blood tests. ? Coronary angiogram. This is a procedure to look at the coronary arteries to see if there is any blockage. During this test, a dye is injected into your arteries so they appear on an X-ray. ? A test that uses sound waves to take a picture of your heart (echocardiogram). ? Chest X-ray.  How is this treated? This condition may be treated by:  Healthy lifestyle changes to reduce risk factors.  Medicines such as: ? Antiplatelet medicines and blood-thinning medicines, such as aspirin. These help to prevent blood clots. ? Nitroglycerin. ? Blood pressure medicines. ? Cholesterol-lowering medicine.  Coronary angioplasty and stenting. During this procedure, a thin, flexible tube is inserted through a blood vessel and into a   blocked artery. A balloon or similar device on the end of the tube is inflated to open up the artery. In some cases, a small, mesh tube (stent) is inserted into the artery to keep it open.  Coronary artery bypass surgery. During this surgery, veins or arteries from other parts of the body are used to create a bypass around the blockage and allow blood  to reach your heart.  Follow these instructions at home: Medicines  Take over-the-counter and prescription medicines only as told by your health care provider.  Do not take the following medicines unless your health care provider approves: ? NSAIDs, such as ibuprofen, naproxen, or celecoxib. ? Vitamin supplements that contain vitamin A, vitamin E, or both. Lifestyle  Follow an exercise program approved by your health care provider. Aim for 150 minutes of moderate exercise or 75 minutes of vigorous exercise each week.  Maintain a healthy weight or lose weight as approved by your health care provider.  Rest when you are tired.  Learn to manage stress or try to limit your stress. Ask your health care provider for suggestions if you need help.  Get screened for depression and seek treatment, if needed.  Do not use any products that contain nicotine or tobacco, such as cigarettes and e-cigarettes. If you need help quitting, ask your health care provider.  Do not use illegal drugs. Eating and drinking  Follow a heart-healthy diet. A dietitian can help educate you about healthy food options and changes. In general, eat plenty of fruits and vegetables, lean meats, and whole grains.  Avoid foods high in: ? Sugar. ? Salt (sodium). ? Saturated fat, such as processed or fatty meat. ? Trans fat, such as fried foods.  Use healthy cooking methods such as roasting, grilling, broiling, baking, poaching, steaming, or stir-frying.  If you drink alcohol, and your health care provider approves, limit your alcohol intake to no more than 2 drinks per day. One drink equals 12 ounces of beer, 5 ounces of wine, or 1 ounces of hard liquor. General instructions  Manage any other health conditions, such as hypertension and diabetes. These conditions affect your heart.  Your health care provider may ask you to monitor your blood pressure. Ideally, your blood pressure should be below 130/80.  Keep all  follow-up visits as told by your health care provider. This is important. Get help right away if:  You have pain in your chest, neck, arm, jaw, stomach, or back that: ? Lasts more than a few minutes. ? Is recurring. ? Is not relieved by taking medicine under your tongue (sublingualnitroglycerin).  You have too much (profuse) sweating without cause.  You have unexplained: ? Heartburn or indigestion. ? Shortness of breath or difficulty breathing. ? Fluttering or fast heartbeat (palpitations). ? Nausea or vomiting. ? Fatigue. ? Feelings of nervousness or anxiety. ? Weakness. ? Diarrhea.  You have sudden light-headedness or dizziness.  You faint.  You feel like hurting yourself or think about taking your own life. These symptoms may represent a serious problem that is an emergency. Do not wait to see if the symptoms will go away. Get medical help right away. Call your local emergency services (911 in the [place] Do not drive yourself to the hospital. Summary  Coronary artery disease (CAD) is a process in which the arteries that lead to the heart (coronary arteries) become narrow or blocked. The narrowing or blockage can lead to a heart attack.  Many people do not have any symptoms during   the early stages of CAD. This is called "silent CAD."  CAD can be treated with lifestyle changes, medicines, surgery, or a combination of these treatments. This information is not intended to replace advice given to you by your health care provider. Make sure you discuss any questions you have with your health care provider. Document Released: 01/30/2014 Document Revised: 06/25/2016 Document Reviewed: 06/25/2016 Elsevier Interactive Patient Education  2017 [employer]  

## 2017-04-06 NOTE — Progress Notes (Signed)
Pt discharged to [facility], transport provided via [facility], report called to [place], LPN. Pt stable at time of d/c.

## 2017-04-06 NOTE — Telephone Encounter (Signed)
Last 3 OV notes faxed to [facility] [phone]

## 2017-04-06 NOTE — Care Management Note (Signed)
Case Management Note  Patient Details  Name: Caleb Hays MRN: [id number] Date of Birth: July 21, 1940  Subjective/Objective:   CHF, COPD, CAD, PVD                 Action/Plan: Discharge Planning: AVS reviewed Chart reviewed. Scheduled dc to SNF-[facility]. CSW following for SNF.    Expected Discharge Date:  04/06/17               Expected Discharge Plan:  Skilled Nursing Facility  In-House Referral:  Clinical Social Work  Discharge planning Services  CM Consult  Post Acute Care Choice:  NA Choice offered to:  NA  DME Arranged:  N/A DME Agency:  NA  HH Arranged:  NA HH Agency:  NA  Status of Service:  Completed, signed off  If discussed at [employer] of Stay Meetings, dates discussed:    Additional Comments:  Elliot Cousin, RN 04/06/2017, 11:29 AM

## 2017-04-06 NOTE — Progress Notes (Addendum)
CARDIAC REHAB PHASE I   Pt wheelchair bound, from SNF, to return for facility today. Completed PCI/stent education.  Reviewed risk factors, anti-platelet therapy, stent card, activity restrictions, ntg, heart healthy diet, s/s chf and phase 2 cardiac rehab. Pt verbalized understanding. Pt agrees to phase 2 cardiac rehab referral per protocol, however, pt states he will not be able to attend due to wheelchair bound/limited functional status, will send referral to [facility]. Pt in bed, call bell within reach.   [id number] Joylene Grapes, RN, BSN 04/06/2017 11:25 AM

## 2017-04-06 NOTE — Discharge Summary (Signed)
Physician Discharge Summary  Caleb Hays KGM:[id number] DOB: Jan 12, 1941 DOA: 03/30/2017  PCP: Patient, No Pcp Per  Admit date: 03/30/2017 Discharge date: 04/06/2017  Time spent: 45 minutes  Recommendations for Outpatient Follow-up:  Patient will be discharged to skilled nursing facility. Continue physical and occupational therapy.  Patient will need to follow up with primary care provider within one week of discharge, repeat CBC, BMP in one week. Repeat thyroid function testing in 4-6 weeks.  Follow up with cardiology, Dr. Allyson Sabal, in 1-2 weeks. Patient should continue medications as prescribed.  Patient should follow a heart healthy diet.   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 Redgie Grayer Caleb Hays is a 76 y.o. 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 Patient was admitted to the hospital 2 weeks ago with hypoxia found to have a right-sided pleural effusion, underwent thoracentesis with 2 L removed was treated with antibiotics and completed a course of Levaquin after discharge, and felt as though he was back to his baseline briefly before the insidious development of shortness of breath over the past several days. Patient denies chest pain, denies any significant cough, and denies fevers  or chills. Reports that he was slightly dyspneic a few days ago, but this has progressively worsened to the point where he has trouble catching his breath while at rest today. X-ray was performed at his nursing facility and concerning for reaccumulation of the pleural effusion on the right side.  Hospital Course:  Coronary artery disease/mildly elevated troponin -Currently chest pain-free -Cardiology consulted and appreciated -Found to have three-vessel disease on cardiac catheterization with mild elevated troponin: EF less than 25%, LM 60%, proximal circumflex 95%, proximal RCA 95%, ramus 95%, Ost LAD 40% -Placed on IV heparin -Cardiothoracic surgery consulted and felt patient was not a surgical candidate for CABG -Status post coronary arthrectomy of the proximal circumflex proximal artery and first acute marginal artery -Continue aspirin, Plavix, Lipitor, Coreg -Follow-up with cardiology in 1-2 weeks  Acute on chronic systolic and diastolic congestive heart failure -BNP 1074.4 -Echocardiogram 03/31/17 EF of 30-35%, grade 2 diastolic dysfunction -Heart catheterization on 04/04/2017 showed EF of less than 25% -Placed on IV Lasix with approximately -8.5L  -Continue Coreg 3.125mg  BID, lisinopril  Daily, Lasix -transitioned to oral lasix  daily -Will be discharged with potassium supplementation as well -Repeat BMP in one week   Recurrent Right pleural effusion with acute hypoxic respiratory failure -Likely related to congestive heart failure -Status post thoracentesis (2.2 L removed) and IV Lasix -Patient requiring 2 L of oxygen, continue to wean as possible  Hypothyroidism -With elevated TSH 7.110, FT4 0.8, FT3 1.3 -Patient was on 12.5 g of Synthroid at home, which has been increased to 25 g -Repeat thyroid function tests in 4-6 weeks per primary care physician  COPD -Currently no cough or wheezing, continue bronchodilators  Chronic iron deficiency anemia -Continue  iron  supplementation -No signs or symptoms of bleeding -Hemoglobin 9.6, appears to be stable -Monitor CBC in 1 week  Peripheral arterial disease -Continue aspirin, Plavix, statin  Procedures: Cardiac catheterization Right thoracentesis Echocardiogram Coronary arthrectomy/stent intervention  Consultations: Cardiology Cardiothoracic surgery  Discharge Exam: Vitals:   04/06/17 0727 04/06/17 0740  BP: (!) 117/48   Pulse:    Resp:    Temp: 99 F (37.2 C)   SpO2:  98%   Patient feeling better today. Denies any chest pain, shortness breath, abdominal pain, nausea vomiting, diarrhea or constipation. Would like to go home.   General: Well developed, chronically ill appearing, NAD  HEENT: NCAT, mucous membranes moist.  Cardiovascular: S1 S2 auscultated, no rubs, murmurs or gallops. Regular rate and rhythm.  Respiratory: Clear to auscultation bilaterally with equal chest rise  Abdomen: Soft, nontender, nondistended, + bowel sounds  Extremities: R BKA, LLE no clubbing/cyanosis/or edema- Left TMA  Neuro: AAOx3, nonfocal  Psych: Normal affect and demeanor with intact judgement and insight  Discharge Instructions Discharge Instructions    Discharge instructions    Complete by:  As directed    Patient will be discharged to skilled nursing facility. Continue physical and occupational therapy.  Patient will need to follow up with primary care provider within one week of discharge, repeat CBC, BMP in one week. Repeat thyroid function testing in 4-6 weeks.  Follow up with cardiology, Dr. Allyson Sabal, in 1-2 weeks. Patient should continue medications as prescribed.  Patient should follow a heart healthy diet.     Current Discharge Medication List    START taking these medications   Details  atorvastatin (LIPITOR) 20 MG tablet Take 1 tablet (20 mg total) by mouth daily at 6 PM. Qty: 30 tablet, Refills: 0    carvedilol (COREG) 3.125 MG tablet Take 1 tablet (3.125 mg total) by mouth 2 (two)  times daily. Qty: 60 tablet, Refills: 0    ferrous sulfate 325 (65 FE) MG tablet Take 1 tablet (325 mg total) by mouth 2 (two) times daily with a meal. Qty: 60 tablet, Refills: 0    furosemide (LASIX) 40 MG tablet Take 1 tablet (40 mg total) by mouth daily. Qty: 30 tablet, Refills: 0    lisinopril (PRINIVIL,ZESTRIL) 5 MG tablet Take 1 tablet (5 mg total) by mouth daily. Qty: 30 tablet, Refills: 0    potassium chloride SA (K-DUR,KLOR-CON) 20 MEQ tablet Take 1 tablet (20 mEq total) by mouth daily. Qty: 30 tablet, Refills: 0      CONTINUE these medications which have CHANGED   Details  levothyroxine (SYNTHROID, LEVOTHROID) 25 MCG tablet Take 1 tablet (25 mcg total) by mouth daily at 6 (six) AM.    oxyCODONE (OXY IR/ROXICODONE) 5 MG immediate release tablet Take 1-2 tablets (5-10 mg total) by mouth every 4 (four) hours as needed (  for moderate pain,  for severe pain). Qty: 15 tablet, Refills: 0    traMADol (ULTRAM) 50 MG tablet Take 1 tablet (50 mg total) by mouth every 6 (six) hours as needed for moderate pain. Qty: 20 tablet, Refills: 0      CONTINUE these medications which have NOT CHANGED   Details  albuterol (PROVENTIL HFA;VENTOLIN HFA) 108 (90 BASE) MCG/ACT inhaler Inhale 2 puffs into the lungs 2 (two) times daily. 0900 & 1700    allopurinol (ZYLOPRIM) 100 MG tablet Take 100 mg by mouth daily.     aspirin 81 MG chewable tablet Chew 81 mg by mouth daily.    cholecalciferol (VITAMIN D)  1000 units tablet Take 1,000 Units by mouth daily. (0900)    clopidogrel (PLAVIX) 75 MG tablet Take 1 tablet (75 mg total) by mouth daily with breakfast. Qty: 30 tablet, Refills: 6    gabapentin (NEURONTIN) 100 MG capsule Take 200 mg by mouth at bedtime.     meloxicam (MOBIC) 7.5 MG tablet Take 7.5 mg by mouth daily.     Multiple Vitamins-Minerals (MULTIVITAMIN WITH MINERALS) tablet Take 1 tablet by mouth daily. (0900)    omeprazole (PRILOSEC) 20 MG capsule Take 20 mg by mouth daily  at 6 (six) AM.     saccharomyces boulardii (FLORASTOR) 250 MG capsule Take 250 mg by mouth 2 (two) times daily.    traZODone (DESYREL) 50 MG tablet Take 50 mg by mouth at bedtime.     guaiFENesin (MUCINEX) 600 MG 12 hr tablet Take 1,200 mg by mouth 2 (two) times daily as needed for cough.    ipratropium-albuterol (DUONEB) 0.5-2.5 (3) MG/3ML SOLN Take 3 mLs by nebulization 4 (four) times daily as needed (for shortness of breath).    loperamide (IMODIUM A-D) 2 MG tablet Take 2-4 mg by mouth every 4 (four) hours as needed for diarrhea or loose stools (Do not give if <3 stools or DX. w/c. diff or norovirus. Do not exceed 16 mg/24hrs.).     methocarbamol (ROBAXIN) 500 MG tablet Take 500 mg by mouth every 6 (six) hours as needed for muscle spasms.        No Known Allergies  Contact information for follow-up providers    Primary care physician. Schedule an appointment as soon as possible for a visit in 1 week(s).   Why:  1 week       Runell Gess, MD. Schedule an appointment as soon as possible for a visit in 1 week(s).   Specialties:  Cardiology, Radiology Why:  hospital follow up Contact information: [address] [place] [address] [phone]            Contact information for after-discharge care    Destination    [facility] Follow up.   Specialty:  Skilled Nursing Facility Contact information: [address] [place] [address] [phone]                   The results of significant diagnostics from this hospitalization (including imaging, microbiology, ancillary and laboratory) are listed below for reference.    Significant Diagnostic Studies: Dg Chest 1 View  Result Date: 03/31/2017 CLINICAL DATA:  Status post right thoracentesis. EXAM: CHEST 1 VIEW COMPARISON:  03/30/2017 FINDINGS: Since prior exam, there has been a significant decrease in right pleural fluid following thoracentesis. Residual right  lung base opacity is likely combination of atelectasis and minimal residual fluid. There is no pneumothorax or evidence of a procedure complication. No other change. IMPRESSION: 1. No pneumothorax or evidence of a complication following thoracentesis. 2. Significant reduction in the right pleural effusion following thoracentesis. Electronically Signed   By: Amie Portland M.D.   On: 03/31/2017 17:48   Dg Chest 1 View  Result Date: 03/18/2017 CLINICAL DATA:  Follow-up examination status post right-sided thoracentesis. EXAM: CHEST 1 VIEW COMPARISON:  Prior CT from 03/17/2017. FINDINGS: Stable cardiomegaly. Mediastinal silhouette within normal limits. Aortic atherosclerosis noted. Lungs normally inflated. Small residual right-sided pleural effusion, improved from previous. No pneumothorax. No new focal infiltrates. Diffuse interstitial prominence without frank pulmonary edema. Osseous structures unchanged. IMPRESSION: 1. Small residual right pleural effusion status post thoracentesis,  improved from previous. No pneumothorax. 2. Stable cardiomegaly with aortic atherosclerosis. Electronically Signed   By: Rise Mu M.D.   On: 03/18/2017 15:45   Dg Chest 2 View  Result Date: 03/30/2017 CLINICAL DATA:  Shortness of breath. EXAM: CHEST  2 VIEW COMPARISON:  March 19, 2017. FINDINGS: Stable cardiomegaly. Normal pulmonary vascularity. New moderate loculated right pleural effusion. Trace left pleural effusion. Bibasilar atelectasis. No pneumothorax. No acute osseous abnormality. IMPRESSION: Interval reaccumulation of moderate loculated right pleural effusion. Electronically Signed   By: Obie Dredge M.D.   On: 03/30/2017 15:54   Dg Chest 2 View  Result Date: 03/19/2017 CLINICAL DATA:  Shortness of breath, COPD, peripheral arterial disease EXAM: CHEST  2 VIEW COMPARISON:  None available FINDINGS: Stable mild cardiomegaly with central vascular congestion. Stable mild interstitial edema pattern with  minor basilar atelectasis and small pleural effusions. No pneumothorax. Trachea is midline. Aorta is atherosclerotic. IMPRESSION: Cardiomegaly with mild interstitial edema pattern and small pleural effusions. Little interval change. Thoracic aortic atherosclerosis Electronically Signed   By: Judie Petit.  Shick M.D.   On: 03/19/2017 11:07   Dg Chest 2 View  Result Date: 03/17/2017 CLINICAL DATA:  Recent pneumonia. EXAM: CHEST  2 VIEW COMPARISON:  June 23, 2015 FINDINGS: No pneumothorax. There is a rounded opacity in the lateral right lung base. There is an air-fluid level seen on frontal and lateral views. Cardiomegaly identified. The hila and mediastinum are unremarkable. No other suspicious findings. IMPRESSION: 1. There is a rounded masslike opacity in the lateral right lung base with an air-fluid level along its superior margin. There is clearly a component of fluid. This could be sequela from recent infection. However a necrotic mass or loculated collection of pleural air and gas is possible. Recommend CT imaging for further evaluation. Findings called to Dr. Jacqulyn Bath. Electronically Signed   By: Gerome Sam III M.D   On: 03/17/2017 18:20   Ct Chest W Contrast  Result Date: 03/17/2017 CLINICAL DATA:  Shortness of breath. EXAM: CT CHEST WITH CONTRAST TECHNIQUE: Multidetector CT imaging of the chest was performed during intravenous contrast administration. CONTRAST:  75mL ISOVUE-300 IOPAMIDOL (ISOVUE-300) INJECTION 61% COMPARISON:  Radiographs of same day. FINDINGS: Cardiovascular: Atherosclerosis of thoracic aorta is noted without aneurysm or dissection. Normal cardiac size. No pericardial effusion is noted. Coronary artery calcifications are noted. Mediastinum/Nodes: No enlarged mediastinal, hilar, or axillary lymph nodes. Thyroid gland, trachea, and esophagus demonstrate no significant findings. Lungs/Pleura: Large possibly loculated right pleural effusion is noted. Mild subsegmental atelectasis is noted in  the right upper lobe. Small left pleural effusion is noted with associated subsegmental atelectasis. No pneumothorax is noted. Upper Abdomen: Severe splenomegaly is noted. Musculoskeletal: No chest wall abnormality. No acute or significant osseous findings. IMPRESSION: Coronary artery calcifications are noted suggesting coronary artery disease. Large probably loculated right pleural effusion is noted. Severe splenomegaly. Aortic Atherosclerosis (ICD10-I70.0). Electronically Signed   By: Lupita Raider, M.D.   On: 03/17/2017 19:26   Ir Thoracentesis Asp Pleural Space W/img Guide  Result Date: 03/18/2017 INDICATION: Patient presents for shortness of breath and cough, found to have a large right pleural effusion. Request is made for diagnostic and therapeutic thoracentesis. EXAM: ULTRASOUND GUIDED DIAGNOSTIC AND THERAPEUTIC RIGHT THORACENTESIS MEDICATIONS: 10 mL 1% lidocaine COMPLICATIONS: None immediate. PROCEDURE: An ultrasound guided thoracentesis was thoroughly discussed with the patient and questions answered. The benefits, risks, alternatives and complications were also discussed. The patient understands and wishes to proceed with the procedure. Written consent was obtained. Ultrasound was performed  to localize and mark an adequate pocket of fluid in the right chest. The area was then prepped and draped in the normal sterile fashion. 1% Lidocaine was used for local anesthesia. Under ultrasound guidance a Safe-T-Centesis catheter was introduced. Thoracentesis was performed. The catheter was removed and a dressing applied. FINDINGS: A total of approximately 2.0 liters of yellow fluid was removed. Samples were sent to the laboratory as requested by the clinical team. IMPRESSION: Successful ultrasound guided diagnostic and therapeutic right thoracentesis yielding 2.0 liters of pleural fluid. No pneumothorax on follow-up chest radiograph. Read by:  Loyce Dys PA-C Electronically Signed   By: Corlis Leak M.D.    On: 03/18/2017 15:26   US Thoracentesis Asp Pleural Space W/img Guide  Result Date: 04/01/2017 INDICATION: COPD, dyspnea, recurrent right pleural effusion. Request made for diagnostic and therapeutic right thoracentesis. EXAM: ULTRASOUND GUIDED DIAGNOSTIC AND THERAPEUTIC RIGHT THORACENTESIS MEDICATIONS: None. COMPLICATIONS: None immediate. PROCEDURE: An ultrasound guided thoracentesis was thoroughly discussed with the patient and questions answered. The benefits, risks, alternatives and complications were also discussed. The patient understands and wishes to proceed with the procedure. Written consent was obtained. Ultrasound was performed to localize and mark an adequate pocket of fluid in the right chest. The area was then prepped and draped in the normal sterile fashion. 1% Lidocaine was used for local anesthesia. Under ultrasound guidance a Safe-T-Centesis catheter was introduced. Thoracentesis was performed. The catheter was removed and a dressing applied. FINDINGS: A total of approximately 2.2 liters of yellow fluid was removed. Samples were sent to the laboratory as requested by the clinical team. IMPRESSION: Successful ultrasound guided diagnostic and therapeutic right thoracentesis yielding 2.2 liters of pleural fluid. Read by: Jeananne Rama, PA-C Electronically Signed   By: Jolaine Click M.D.   On: 03/31/2017 17:28    Microbiology: Recent Results (from the past 240 hour(s))  MRSA PCR Screening     Status: None   Collection Time: 03/31/17  6:45 AM  Result Value Ref Range Status   MRSA by PCR NEGATIVE NEGATIVE Final    Comment:        The GeneXpert MRSA Assay (FDA approved for NASAL specimens only), is one component of a comprehensive MRSA colonization surveillance program. It is not intended to diagnose MRSA infection nor to guide or monitor treatment for MRSA infections.   Culture, body fluid-bottle     Status: None   Collection Time: 03/31/17  5:38 PM  Result Value Ref Range Status    Specimen Description PLEURAL FLUID  Final   Special Requests BOTTLES DRAWN AEROBIC AND ANAEROBIC  Final   Culture   Final    NO GROWTH 5 DAYS Performed at [facility], [address] [place], [place] [address]    Report Status 04/05/2017 FINAL  Final  Gram stain     Status: None   Collection Time: 03/31/17  5:38 PM  Result Value Ref Range Status   Specimen Description PLEURAL FLUID  Final   Special Requests NONE  Final   Gram Stain   Final    CYTOSPIN SMEAR WBC PRESENT,BOTH PMN AND MONONUCLEAR NO ORGANISMS SEEN Performed at [facility], [address] [place], [place] [address]    Report Status 04/01/2017 FINAL  Final  MRSA PCR Screening     Status: Abnormal   Collection Time: 04/04/17  6:38 PM  Result Value Ref Range Status   MRSA by PCR POSITIVE (A) NEGATIVE Final    Comment:  The GeneXpert MRSA Assay (FDA approved for NASAL specimens only), is one component of a comprehensive MRSA colonization surveillance program. It is not intended to diagnose MRSA infection nor to guide or monitor treatment for MRSA infections. RESULT CALLED TO, READ BACK BY AND VERIFIED WITH: S.CORREA,RN AT 2145 BY L.PITT      Labs: Basic Metabolic Panel:  Recent Labs Lab 04/02/17 0115 04/03/17 0548 04/04/17 0536 04/05/17 0153 04/06/17 0127  NA 138 137 137 136 135  K 4.2 4.2 4.1 4.1 3.8  CL 101 99* 99* 100* 101  CO2 29 29 30 29 26   GLUCOSE 86 74 74 82 141*  BUN 19 18 19 19 20   CREATININE 1.12 1.08 1.17 1.16 1.05  CALCIUM 8.5* 8.4* 8.3* 8.1* 7.9*   Liver Function Tests:  Recent Labs Lab 03/30/17 1622  AST 17  ALT 10*  ALKPHOS 70  BILITOT 0.7  PROT 5.9*  ALBUMIN 3.2*   No results for input(s): LIPASE, AMYLASE in the last 168 hours. No results for input(s): AMMONIA in the last 168 hours. CBC:  Recent Labs Lab 03/30/17 1622  04/01/17 0825 04/02/17 0115 04/03/17 0031 04/03/17 0548 04/04/17 0536 04/05/17 0153  WBC 7.5  < > 6.8 8.4  --  9.5 9.3  9.8  NEUTROABS 3.9  --   --   --   --   --   --   --   HGB 9.5*  < > 8.9* 9.2* 9.9* 9.7* 9.4* 9.6*  HCT 30.5*  < > 27.7* 29.2* 31.1* 30.3* 29.1* 31.3*  MCV 69.0*  < > 69.4* 68.2*  --  67.8* 69.8* 69.7*  PLT 396  < > 383 403*  --  452* 427* 442*  < > = values in this interval not displayed. Cardiac Enzymes:  Recent Labs Lab 03/30/17 1622 03/30/17 2350 03/31/17 0457 03/31/17 0936  TROPONINI 0.35* 0.37* 0.32* 0.29*   BNP: BNP (last 3 results)  Recent Labs  03/17/17 1725 03/30/17 1622  BNP 1,406.0* 1,074.4*    ProBNP (last 3 results) No results for input(s): PROBNP in the last 8760 hours.  CBG: No results for input(s): GLUCAP in the last 168 hours.     SignedEdsel Petrin  [facility] 04/06/2017, 10:36 AM

## 2017-04-06 NOTE — Clinical Social Work Note (Signed)
CSW facilitated patient discharge including contacting facility to confirm patient discharge plans. Patient declined to have CSW call family. Clinical information faxed to facility and family agreeable with plan. CSW arranged ambulance transport via [facility] to [facility]. RN to call report prior to discharge [phone]).  CSW will sign off for now as social work intervention is no longer needed. Please consult [place] again if new needs arise.  Charlynn Court, CSW [phone]

## 2017-04-15 ENCOUNTER — Ambulatory Visit: Payer: Medicare HMO | Admitting: Vascular Surgery

## 2017-04-15 ENCOUNTER — Encounter (HOSPITAL_COMMUNITY): Payer: Medicare HMO

## 2017-04-18 DIAGNOSIS — I5043 Acute on chronic combined systolic (congestive) and diastolic (congestive) heart failure: Secondary | ICD-10-CM

## 2017-04-18 HISTORY — DX: Acute on chronic combined systolic (congestive) and diastolic (congestive) heart failure: I50.43

## 2017-04-20 ENCOUNTER — Inpatient Hospital Stay (HOSPITAL_COMMUNITY)
Admission: EM | Admit: 2017-04-20 | Discharge: 2017-04-22 | DRG: 291 | Disposition: A | Discharge status: Skilled Nursing Facility | Payer: Medicare HMO | Attending: Internal Medicine | Admitting: Internal Medicine

## 2017-04-20 ENCOUNTER — Encounter: Payer: Self-pay | Admitting: Vascular Surgery

## 2017-04-20 ENCOUNTER — Encounter (HOSPITAL_COMMUNITY): Payer: Self-pay | Admitting: Emergency Medicine

## 2017-04-20 ENCOUNTER — Emergency Department (HOSPITAL_COMMUNITY): Payer: Medicare HMO

## 2017-04-20 DIAGNOSIS — I5043 Acute on chronic combined systolic (congestive) and diastolic (congestive) heart failure: Secondary | ICD-10-CM | POA: Diagnosis present

## 2017-04-20 DIAGNOSIS — J90 Pleural effusion, not elsewhere classified: Secondary | ICD-10-CM | POA: Diagnosis present

## 2017-04-20 DIAGNOSIS — F329 Major depressive disorder, single episode, unspecified: Secondary | ICD-10-CM | POA: Diagnosis present

## 2017-04-20 DIAGNOSIS — E43 Unspecified severe protein-calorie malnutrition: Secondary | ICD-10-CM | POA: Diagnosis present

## 2017-04-20 DIAGNOSIS — N183 Chronic kidney disease, stage 3 unspecified: Secondary | ICD-10-CM | POA: Diagnosis present

## 2017-04-20 DIAGNOSIS — Z681 Body mass index (BMI) 19 or less, adult: Secondary | ICD-10-CM

## 2017-04-20 DIAGNOSIS — Z89511 Acquired absence of right leg below knee: Secondary | ICD-10-CM | POA: Diagnosis not present

## 2017-04-20 DIAGNOSIS — J449 Chronic obstructive pulmonary disease, unspecified: Secondary | ICD-10-CM | POA: Diagnosis present

## 2017-04-20 DIAGNOSIS — J9611 Chronic respiratory failure with hypoxia: Secondary | ICD-10-CM | POA: Diagnosis present

## 2017-04-20 DIAGNOSIS — I083 Combined rheumatic disorders of mitral, aortic and tricuspid valves: Secondary | ICD-10-CM | POA: Diagnosis present

## 2017-04-20 DIAGNOSIS — I272 Pulmonary hypertension, unspecified: Secondary | ICD-10-CM | POA: Diagnosis present

## 2017-04-20 DIAGNOSIS — Z7902 Long term (current) use of antithrombotics/antiplatelets: Secondary | ICD-10-CM

## 2017-04-20 DIAGNOSIS — D631 Anemia in chronic kidney disease: Secondary | ICD-10-CM | POA: Diagnosis present

## 2017-04-20 DIAGNOSIS — R627 Adult failure to thrive: Secondary | ICD-10-CM | POA: Diagnosis present

## 2017-04-20 DIAGNOSIS — R0602 Shortness of breath: Secondary | ICD-10-CM | POA: Diagnosis present

## 2017-04-20 DIAGNOSIS — D638 Anemia in other chronic diseases classified elsewhere: Secondary | ICD-10-CM | POA: Diagnosis present

## 2017-04-20 DIAGNOSIS — Z9841 Cataract extraction status, right eye: Secondary | ICD-10-CM | POA: Diagnosis not present

## 2017-04-20 DIAGNOSIS — Z791 Long term (current) use of non-steroidal anti-inflammatories (NSAID): Secondary | ICD-10-CM

## 2017-04-20 DIAGNOSIS — I509 Heart failure, unspecified: Secondary | ICD-10-CM | POA: Insufficient documentation

## 2017-04-20 DIAGNOSIS — Z7982 Long term (current) use of aspirin: Secondary | ICD-10-CM

## 2017-04-20 DIAGNOSIS — I739 Peripheral vascular disease, unspecified: Secondary | ICD-10-CM | POA: Diagnosis present

## 2017-04-20 DIAGNOSIS — Z9981 Dependence on supplemental oxygen: Secondary | ICD-10-CM | POA: Diagnosis not present

## 2017-04-20 DIAGNOSIS — Z961 Presence of intraocular lens: Secondary | ICD-10-CM | POA: Diagnosis present

## 2017-04-20 DIAGNOSIS — Z955 Presence of coronary angioplasty implant and graft: Secondary | ICD-10-CM | POA: Diagnosis not present

## 2017-04-20 DIAGNOSIS — Z9842 Cataract extraction status, left eye: Secondary | ICD-10-CM

## 2017-04-20 DIAGNOSIS — Z89421 Acquired absence of other right toe(s): Secondary | ICD-10-CM | POA: Diagnosis not present

## 2017-04-20 DIAGNOSIS — E440 Moderate protein-calorie malnutrition: Secondary | ICD-10-CM | POA: Diagnosis present

## 2017-04-20 DIAGNOSIS — Z87891 Personal history of nicotine dependence: Secondary | ICD-10-CM

## 2017-04-20 DIAGNOSIS — Z79899 Other long term (current) drug therapy: Secondary | ICD-10-CM

## 2017-04-20 DIAGNOSIS — Z89422 Acquired absence of other left toe(s): Secondary | ICD-10-CM

## 2017-04-20 DIAGNOSIS — Z8701 Personal history of pneumonia (recurrent): Secondary | ICD-10-CM | POA: Diagnosis not present

## 2017-04-20 DIAGNOSIS — Z8249 Family history of ischemic heart disease and other diseases of the circulatory system: Secondary | ICD-10-CM

## 2017-04-20 DIAGNOSIS — IMO0002 Reserved for concepts with insufficient information to code with codable children: Secondary | ICD-10-CM

## 2017-04-20 DIAGNOSIS — I25110 Atherosclerotic heart disease of native coronary artery with unstable angina pectoris: Secondary | ICD-10-CM | POA: Diagnosis present

## 2017-04-20 DIAGNOSIS — E039 Hypothyroidism, unspecified: Secondary | ICD-10-CM | POA: Diagnosis present

## 2017-04-20 HISTORY — DX: Acute on chronic combined systolic (congestive) and diastolic (congestive) heart failure: I50.43

## 2017-04-20 HISTORY — DX: Chronic kidney disease, stage 3 unspecified: N18.30

## 2017-04-20 HISTORY — DX: Chronic kidney disease, stage 3 (moderate): N18.3

## 2017-04-20 LAB — BASIC METABOLIC PANEL
Anion gap: 10 (ref 5–15)
BUN: 20 mg/dL (ref 6–20)
CHLORIDE: 103 mmol/L (ref 101–111)
CO2: 27 mmol/L (ref 22–32)
Calcium: 8.4 mg/dL — ABNORMAL LOW (ref 8.9–10.3)
Creatinine, Ser: 1.17 mg/dL (ref 0.61–1.24)
GFR calc Af Amer: >60 mL/min (ref >60)
GFR calc non Af Amer: 59 mL/min — ABNORMAL LOW (ref >60)
Glucose, Bld: 119 mg/dL — ABNORMAL HIGH (ref 65–99)
POTASSIUM: 4.4 mmol/L (ref 3.5–5.1)
SODIUM: 140 mmol/L (ref 135–145)

## 2017-04-20 LAB — CBC
HEMATOCRIT: 31.8 % — AB (ref 39.0–52.0)
Hemoglobin: 9.5 g/dL — ABNORMAL LOW (ref 13.0–17.0)
MCH: 21.4 pg — ABNORMAL LOW (ref 26.0–34.0)
MCHC: 29.9 g/dL — ABNORMAL LOW (ref 30.0–36.0)
MCV: 71.6 fL — AB (ref 78.0–100.0)
PLATELETS: 467 10*3/uL — AB (ref 150–400)
RBC: 4.44 MIL/uL (ref 4.22–5.81)
RDW: 19.1 % — AB (ref 11.5–15.5)
WBC: 9.1 10*3/uL (ref 4.0–10.5)

## 2017-04-20 LAB — I-STAT TROPONIN, ED: TROPONIN I, POC: 0.01 ng/mL (ref 0.00–0.08)

## 2017-04-20 LAB — BRAIN NATRIURETIC PEPTIDE: B Natriuretic Peptide: 1919 pg/mL — ABNORMAL HIGH (ref 0.0–100.0)

## 2017-04-20 MED ORDER — FUROSEMIDE 10 MG/ML IJ SOLN
40.0000 mg | INTRAMUSCULAR | Status: AC
  Administered 2017-04-20: 40 mg via INTRAVENOUS
  Filled 2017-04-20: qty 4

## 2017-04-20 MED ORDER — IPRATROPIUM-ALBUTEROL 0.5-2.5 (3) MG/3ML IN SOLN
3.0000 mL | Freq: Four times a day (QID) | RESPIRATORY_TRACT | Status: DC | PRN
  Administered 2017-04-20 – 2017-04-21 (×2): 3 mL via RESPIRATORY_TRACT
  Filled 2017-04-20 (×2): qty 3

## 2017-04-20 NOTE — ED Notes (Signed)
Pt requesting a breathing tx

## 2017-04-20 NOTE — ED Provider Notes (Signed)
[facility]-EMERGENCY DEPT Provider Note   CSN: [id number] Arrival date & time: 04/20/17  1500     History   Chief Complaint Chief Complaint  Patient presents with  . Pleural Effusion    HPI Caleb Hays is a 76 y.o. male.  77yo M w/ PMH including COPD, PVD, LE amputation, CAD, CHF who p/w shortness of breath. He patient was discharged from the hospital a few weeks ago after he was admitted for recurrent right pleural effusion. During hospitalization, he had pleurocentesis with 2 L of fluid removed from his right long. He also underwent cardiac cath which showed multivessel disease, he was deemed a poor surgical candidate for CABG, medically managed. He went home on 2L O2. He has had a few chest x-rays at his nursing facility including one this morning that showed bilateral pleural effusions and he was sent here for further evaluation. He is currently on 3 L O2. He reports that he usually feels improved immediately after pleurocentesis but he gradually developed shortness of breath again. He denies any severe shortness of breath, it is only intermittent. No associated cough, chest pain, fevers, vomiting, diarrhea, or other complaints.   The history is provided by the patient.    Past Medical History:  Diagnosis Date  . Adult failure to thrive    /notes 06/23/2015  . Anemia   . Arthritis    "hands, fingers" (11/10/2016  . COPD (chronic obstructive pulmonary disease) (HCC)    LONG TIME SMOKER  . Critical lower limb ischemia   . 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 Depression    "periods of depression" (06/23/2015)  . Gout   . Hypothyroidism   . PAD (peripheral artery disease) (HCC)   . Physical deconditioning   . Pyelonephritis 06/23/2015  . Status post foot surgery    right fifth toe amputation by Dr. Lajoyce Corners     Patient Active Problem List   Diagnosis Date Noted  . Coronary artery disease involving native coronary artery of native heart with  unstable angina pectoris (HCC)   . Acute on chronic combined systolic and diastolic CHF (congestive heart failure) (HCC)   . Dilated cardiomyopathy (HCC)   . Elevated troponin 03/30/2017  . Chronic respiratory failure with hypoxia (HCC) 03/30/2017  . HCAP (healthcare-associated pneumonia) 03/17/2017  . Recurrent right pleural effusion 03/17/2017  . Chronic pain 03/17/2017  . S/P transmetatarsal amputation of foot, left (HCC) 10/08/2016  . PAD (peripheral artery disease) (HCC) 09/15/2016  . Edema 05/05/2016  . Phantom pain (HCC) 03/08/2016  . Gout 03/08/2016  . Hypothyroidism 03/08/2016  . CKD (chronic kidney disease) stage 3, GFR 30-59 ml/min (HCC) 03/08/2016  . GERD without esophagitis 03/08/2016  . Constipation 12/29/2015  . Below knee amputation status (HCC) 10/01/2015  . Ankle pain 08/13/2015  . Adult failure to thrive   . PVD (peripheral vascular disease) (HCC)   . Elevated blood uric acid level 08/10/2015  . 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 Anemia of chronic disease 08/10/2015  . Malnutrition of moderate degree 06/24/2015  . Failure to thrive in adult 06/23/2015  . Weight loss 06/23/2015  . Tobacco abuse 06/23/2015  . COPD (chronic obstructive pulmonary disease) (HCC) 06/23/2015  . Fall   . Critical lower limb ischemia 05/31/2014  . Status post right foot surgery 03/12/2014    Past Surgical History:  Procedure Laterality Date  . ABDOMINAL AORTOGRAM W/LOWER EXTREMITY N/A 09/03/2016   Procedure: Abdominal Aortogram w/Lower Extremity;  Surgeon: Sherren Kerns, MD;  Location: [facility] INVASIVE CV LAB;  Service: Cardiovascular;  Laterality: N/A;  Lt. leg  . ABDOMINAL AORTOGRAM W/LOWER EXTREMITY N/A 11/10/2016   Procedure: Abdominal Aortogram w/Lower Extremity;  Surgeon: Maeola Harman, MD;  Location: [facility] INVASIVE CV LAB;  Service: Cardiovascular;  Laterality: N/A;  . AMPUTATION Right 03/01/2014   Procedure: AMPUTATION RAY;  Surgeon: Nadara Mustard, MD;  Location: [facility]  OR;  Service: Orthopedics;  Laterality: Right;  Right Foot 5th Ray Amputation  . AMPUTATION Right 04/26/2014   Procedure: Right Foot 4th Ray Amputation;  Surgeon: Nadara Mustard, MD;  Location: [facility] OR;  Service: Orthopedics;  Laterality: Right;  . AMPUTATION Right 10/01/2015   Procedure: Right Below Knee Amputation;  Surgeon: Nadara Mustard, MD;  Location: [facility] OR;  Service: Orthopedics;  Laterality: Right;  . AMPUTATION Left 10/08/2016   Procedure: Left Transmetatarsal Amputation;  Surgeon: Nadara Mustard, MD;  Location: [facility] OR;  Service: Orthopedics;  Laterality: Left;  . AMPUTATION Left 07/23/2016   Procedure: Left 2nd Toe Amputation;  Surgeon: Nadara Mustard, MD;  Location: [facility] OR;  Service: Orthopedics;  Laterality: Left;  . ANKLE ARTHROSCOPY Right 08/13/2015   Procedure: ANKLE ARTHROSCOPY;  Surgeon: Nadara Mustard, MD;  Location: [facility] OR;  Service: Orthopedics;  Laterality: Right;  . CATARACT EXTRACTION W/ INTRAOCULAR LENS  IMPLANT, BILATERAL Bilateral   . CORONARY ATHERECTOMY N/A 04/05/2017   Procedure: CORONARY ATHERECTOMY;  Surgeon: Marykay Lex, MD;  Location: [facility] INVASIVE CV LAB;  Service: Cardiovascular;  Laterality: N/A;  . CORONARY STENT INTERVENTION N/A 04/05/2017   Procedure: CORONARY STENT INTERVENTION;  Surgeon: Marykay Lex, MD;  Location: [facility] INVASIVE CV LAB;  Service: Cardiovascular;  Laterality: N/A;  . INGUINAL HERNIA REPAIR Left   . IR THORACENTESIS ASP PLEURAL SPACE W/IMG GUIDE  03/18/2017  . KNEE CARTILAGE SURGERY Left 1960's   football injury  . KNEE LIGAMENT RECONSTRUCTION Left 1960's  . LOWER EXTREMITY ANGIOGRAM N/A 05/27/2014   Procedure: LOWER EXTREMITY ANGIOGRAM;  Surgeon: Runell Gess, MD;  Location: [facility] CATH LAB;  Service: Cardiovascular;  Laterality: N/A;  . LOWER EXTREMITY ANGIOGRAPHY Left 09/15/2016   Procedure: Lower Extremity Angiography;  Surgeon: Maeola Harman, MD;  Location: [facility] INVASIVE CV LAB;  Service: Cardiovascular;  Laterality: Left;  . PERIPHERAL  VASCULAR ATHERECTOMY  11/10/2016   Procedure: Peripheral Vascular Atherectomy;  Surgeon: Maeola Harman, MD;  Location: [facility] INVASIVE CV LAB;  Service: Cardiovascular;;  . PERIPHERAL VASCULAR INTERVENTION Left 09/15/2016   Procedure: Peripheral Vascular Intervention;  Surgeon: Maeola Harman, MD;  Location: [facility] INVASIVE CV LAB;  Service: Cardiovascular;  Laterality: Left;  SFA/popiteal  . PERIPHERAL VASCULAR INTERVENTION  11/10/2016   Hattie Perch 11/10/2016  . PERIPHERAL VASCULAR INTERVENTION  11/10/2016   Procedure: Peripheral Vascular Intervention;  Surgeon: Maeola Harman, MD;  Location: [facility] INVASIVE CV LAB;  Service: Cardiovascular;;  . REFRACTIVE SURGERY Bilateral   . RIGHT/LEFT HEART CATH AND CORONARY ANGIOGRAPHY N/A 04/04/2017   Procedure: RIGHT/LEFT HEART CATH AND CORONARY ANGIOGRAPHY;  Surgeon: Marykay Lex, MD;  Location: [facility] INVASIVE CV LAB;  Service: Cardiovascular;  Laterality: N/A;  . STUMP REVISION Right 12/05/2015   Procedure: Revision Right Below Knee Amputation;  Surgeon: Nadara Mustard, MD;  Location: [facility] OR;  Service: Orthopedics;  Laterality: Right;  . STUMP REVISION Left 11/19/2016   Procedure: Revision Left Transmetatarsal Amputation;  Surgeon: Nadara Mustard, MD;  Location: [facility] OR;  Service: Orthopedics;  Laterality: Left;  Home Medications    Prior to Admission medications   Medication Sig Start Date End Date Taking? Authorizing Provider  albuterol (PROVENTIL HFA;VENTOLIN HFA) 108 (90 BASE) MCG/ACT inhaler Inhale 2 puffs into the lungs 2 (two) times daily. 0900 & 1700    [provider]  allopurinol (ZYLOPRIM) 100 MG tablet Take 100 mg by mouth daily.     [provider]  aspirin 81 MG chewable tablet Chew 81 mg by mouth daily.    [provider]  atorvastatin (LIPITOR) 20 MG tablet Take 1 tablet (20 mg total) by mouth daily at 6 PM. 04/06/17   Mikhail, Nita Sells, DO  carvedilol (COREG) 3.125 MG tablet Take 1 tablet (3.125  mg total) by mouth 2 (two) times daily. 04/06/17   Edsel Petrin, DO  cholecalciferol (VITAMIN D) 1000 units tablet Take 1,000 Units by mouth daily. (0900)    [provider]  clopidogrel (PLAVIX) 75 MG tablet Take 1 tablet (75 mg total) by mouth daily with breakfast. 09/16/16   Raymond Gurney, PA-C  ferrous sulfate 325 (65 FE) MG tablet Take 1 tablet (325 mg total) by mouth 2 (two) times daily with a meal. 04/06/17   Mikhail, Nita Sells, DO  furosemide (LASIX) 40 MG tablet Take 1 tablet (40 mg total) by mouth daily. 04/07/17   Mikhail, Nita Sells, DO  gabapentin (NEURONTIN) 100 MG capsule Take 200 mg by mouth at bedtime.     [provider]  guaiFENesin (MUCINEX) 600 MG 12 hr tablet Take 1,200 mg by mouth 2 (two) times daily as needed for cough.    [provider]  ipratropium-albuterol (DUONEB) 0.5-2.5 (3) MG/3ML SOLN Take 3 mLs by nebulization 4 (four) times daily as needed (for shortness of breath).    [provider]  levothyroxine (SYNTHROID, LEVOTHROID) 25 MCG tablet Take 1 tablet (25 mcg total) by mouth daily at 6 (six) AM. 04/06/17   Mikhail, Nita Sells, DO  lisinopril (PRINIVIL,ZESTRIL) 5 MG tablet Take 1 tablet (5 mg total) by mouth daily. 04/07/17   Mikhail, Nita Sells, DO  loperamide (IMODIUM A-D) 2 MG tablet Take 2-4 mg by mouth every 4 (four) hours as needed for diarrhea or loose stools (Do not give if <3 stools or DX. w/c. diff or norovirus. Do not exceed 16 mg/24hrs.).     [provider]  meloxicam (MOBIC) 7.5 MG tablet Take 7.5 mg by mouth daily.     [provider]  methocarbamol (ROBAXIN) 500 MG tablet Take 500 mg by mouth every 6 (six) hours as needed for muscle spasms.     [provider]  Multiple Vitamins-Minerals (MULTIVITAMIN WITH MINERALS) tablet Take 1 tablet by mouth daily. (0900)    [provider]  omeprazole (PRILOSEC) 20 MG capsule Take 20 mg by mouth daily at 6 (six) AM.     [provider]    oxyCODONE (OXY IR/ROXICODONE) 5 MG immediate release tablet Take 1-2 tablets (5-10 mg total) by mouth every 4 (four) hours as needed (5mg  for moderate pain, 10mg  for severe pain). 04/06/17   Mikhail, Nita Sells, DO  potassium chloride SA (K-DUR,KLOR-CON) 20 MEQ tablet Take 1 tablet (20 mEq total) by mouth daily. 04/07/17   Mikhail, Nita Sells, DO  saccharomyces boulardii (FLORASTOR) 250 MG capsule Take 250 mg by mouth 2 (two) times daily.    [provider]  traMADol (ULTRAM) 50 MG tablet Take 1 tablet (50 mg total) by mouth every 6 (six) hours as needed for moderate pain. 04/06/17   Edsel Petrin, DO  traZODone (DESYREL) 50 MG tablet Take 50 mg by mouth at bedtime.     [provider]    Family History Family History  Problem Relation Age of Onset  . Heart failure Father   . Heart attack Father   . Heart failure Brother     Social History Social History  Substance Use Topics  . Smoking status: Former Smoker    Packs/day: 1.00    Years: 57.00    Types: Cigarettes    Quit date: 03/19/2016  . Smokeless tobacco: Never Used  . Alcohol use No     Comment: 11/10/2016 I was drinking alot of beer; quit in 2015"      Allergies   Patient has no known allergies.   Review of Systems Review of Systems All other systems reviewed and are negative except that which was mentioned in HPI   Physical Exam Updated Vital Signs Ht  (1.93 m)   Wt 69.9 kg (154 lb)   SpO2 95%   BMI 18.75 kg/m   Physical Exam  Constitutional: He is oriented to person, place, and time. He appears well-developed. No distress.  Frail, chronically ill-appearing man awake and comfortable  HENT:  Head: Normocephalic and atraumatic.  Moist mucous membranes  Eyes: Pupils are equal, round, and reactive to light. Conjunctivae are normal.  Neck: Neck supple.  Cardiovascular: Normal rate, regular rhythm and normal heart sounds.   No murmur heard. Pulmonary/Chest: Effort normal.  Severely diminished  BS b/l bases, crackles L lower lung  Abdominal: Soft. Bowel sounds are normal. He exhibits no distension. There is no tenderness.  Musculoskeletal: He exhibits no edema.  R BKA, L partial foot amputation  Neurological: He is alert and oriented to person, place, and time.  Fluent speech  Skin: Skin is warm and dry.  Psychiatric: He has a normal mood and affect. Judgment normal.  Nursing note and vitals reviewed.    ED Treatments / Results  Labs (all labs ordered are listed, but only abnormal results are displayed) Labs Reviewed  BASIC METABOLIC PANEL - Abnormal; Notable for the following:       Result Value   Glucose, Bld 119 (*)    Calcium 8.4 (*)    GFR calc non Af Amer 59 (*)    All other components within normal limits  CBC - Abnormal; Notable for the following:    Hemoglobin 9.5 (*)    HCT 31.8 (*)    MCV 71.6 (*)    MCH 21.4 (*)    MCHC 29.9 (*)    RDW 19.1 (*)    Platelets 467 (*)    All other components within normal limits  BRAIN NATRIURETIC PEPTIDE - Abnormal; Notable for the following:    B Natriuretic Peptide 1,919.0 (*)    All other components within normal limits  I-STAT TROPONIN, ED    EKG  EKG Interpretation None       Radiology No results found.  Procedures Procedures (including critical care time)  Medications Ordered in ED Medications - No data to display   Initial Impression / Assessment and Plan / ED Course  I have reviewed the triage vital signs and the nursing notes.  Pertinent labs & imaging results that were available during my care of the patient were reviewed by me and considered in my medical decision making (see chart for details).     PT w/ recurrent pleural effusions, severe CHF. He was non-toxic on exam, 95% on 3L . Afebrile. Obtained  CXR which shows recurrent R pleural effusion. Labs show normal Cr, normal trop, BNP elevated at 1919. Gave IV lasix.   Pt has been compliant with lasix at nursing facility but he does appear  to be having another CHF exacerbation. Because his O2 requirement has increased and he is symptomatic, discussed admission with medicine, Dr. Elvera Lennox, and pt admitted for further treatment.  Final Clinical Impressions(s) / ED Diagnoses   Final diagnoses:  Pleural effusion, right  Acute on chronic combined systolic and diastolic congestive heart failure [facility])    New Prescriptions New Prescriptions   No medications on file     Janey Petron, Ambrose Finland, MD 04/20/17 2309

## 2017-04-20 NOTE — ED Notes (Signed)
Patient transported to X-ray 

## 2017-04-20 NOTE — ED Notes (Signed)
Ordered dinner tray.  

## 2017-04-20 NOTE — ED Notes (Signed)
Pt has dinner tray at bedside. 

## 2017-04-20 NOTE — H&P (Signed)
History and Physical    Caleb Hays:[id number] DOB: 05/11/1941 DOA: 04/20/2017  I have briefly reviewed the patient's prior medical records in [facility]  PCP: Patient, No Pcp Per  Patient coming from: SNF  Chief Complaint: Shortness of breath  HPI: Caleb Hays is a 76 y.o. male with medical history significant of coronary artery disease, COPD, tobacco abuse in remission, hypothyroidism, peripheral arterial disease status post bilateral amputations, chronic systolic and diastolic CHF, presents to the emergency room with chief complaint of shortness of breath.  Patient has been hospitalized twice in September 2018 with shortness of breath found to have a right-sided pleural effusion which she diuresis and thoracentesis.  He felt better after each time, was discharged to SNF.  He states that after he left the hospital on 04/06/2017, he felt well, however over the last several days has been having progressive shortness of breath at rest.  Patient has a right BKA left ray amputation and he is minimally ambulatory.  He denies any chest pain.  Denies any abdominal pain, nausea or vomiting.  He has had no fever or chills.  He was evaluated by cardiology last time due to significant coronary artery disease, had a cardiac catheterization done on 9/17 with multiple vessel disease and he was evaluated by cardiothoracic surgery who did not think patient is a CABG candidate.  He underwent a repeat cath next day and underwent rotational atherectomy on his left circumflex/marginal vessel in 2 different sites.  He felt well, his breathing was back to baseline after diuresis and thoracentesis on 9/14 and was discharged to SNF.  ED Course: In the ED is slight increase of his oxygen requirement to 3 L from a baseline of 2 L but otherwise his vitals are stable, his chest x-ray showed moderate right pleural effusion, on blood work is significant for a BNP which is elevated close to 2000.  He was given IV  Lasix and we were asked to admit  Review of Systems: As per HPI otherwise 10 point review of systems negative.   Past Medical History:  Diagnosis Date  . Adult failure to thrive    /notes 06/23/2015  . Anemia   . Arthritis    "hands, fingers" (11/10/2016  . COPD (chronic obstructive pulmonary disease) (HCC)    LONG TIME SMOKER  . Critical lower limb ischemia   . 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 Depression    "periods of depression" (06/23/2015)  . Gout   . Hypothyroidism   . PAD (peripheral artery disease) (HCC)   . Physical deconditioning   . Pyelonephritis 06/23/2015  . Status post foot surgery    right fifth toe amputation by Dr. Lajoyce Corners     Past Surgical History:  Procedure Laterality Date  . ABDOMINAL AORTOGRAM W/LOWER EXTREMITY N/A 09/03/2016   Procedure: Abdominal Aortogram w/Lower Extremity;  Surgeon: Sherren Kerns, MD;  Location: [facility] INVASIVE CV LAB;  Service: Cardiovascular;  Laterality: N/A;  Lt. leg  . ABDOMINAL AORTOGRAM W/LOWER EXTREMITY N/A 11/10/2016   Procedure: Abdominal Aortogram w/Lower Extremity;  Surgeon: Maeola Harman, MD;  Location: [facility] INVASIVE CV LAB;  Service: Cardiovascular;  Laterality: N/A;  . AMPUTATION Right 03/01/2014   Procedure: AMPUTATION RAY;  Surgeon: Nadara Mustard, MD;  Location: [facility] OR;  Service: Orthopedics;  Laterality: Right;  Right Foot 5th Ray Amputation  . AMPUTATION Right 04/26/2014   Procedure: Right Foot 4th Ray Amputation;  Surgeon: Berna Spare  Kandis Mannan, MD;  Location: [facility] OR;  Service: Orthopedics;  Laterality: Right;  . AMPUTATION Right 10/01/2015   Procedure: Right Below Knee Amputation;  Surgeon: Nadara Mustard, MD;  Location: [facility] OR;  Service: Orthopedics;  Laterality: Right;  . AMPUTATION Left 10/08/2016   Procedure: Left Transmetatarsal Amputation;  Surgeon: Nadara Mustard, MD;  Location: [facility] OR;  Service: Orthopedics;  Laterality: Left;  . AMPUTATION Left 07/23/2016   Procedure: Left 2nd Toe  Amputation;  Surgeon: Nadara Mustard, MD;  Location: [facility] OR;  Service: Orthopedics;  Laterality: Left;  . ANKLE ARTHROSCOPY Right 08/13/2015   Procedure: ANKLE ARTHROSCOPY;  Surgeon: Nadara Mustard, MD;  Location: [facility] OR;  Service: Orthopedics;  Laterality: Right;  . CATARACT EXTRACTION W/ INTRAOCULAR LENS  IMPLANT, BILATERAL Bilateral   . CORONARY ATHERECTOMY N/A 04/05/2017   Procedure: CORONARY ATHERECTOMY;  Surgeon: Marykay Lex, MD;  Location: [facility] INVASIVE CV LAB;  Service: Cardiovascular;  Laterality: N/A;  . CORONARY STENT INTERVENTION N/A 04/05/2017   Procedure: CORONARY STENT INTERVENTION;  Surgeon: Marykay Lex, MD;  Location: [facility] INVASIVE CV LAB;  Service: Cardiovascular;  Laterality: N/A;  . INGUINAL HERNIA REPAIR Left   . IR THORACENTESIS ASP PLEURAL SPACE W/IMG GUIDE  03/18/2017  . KNEE CARTILAGE SURGERY Left 1960's   football injury  . KNEE LIGAMENT RECONSTRUCTION Left 1960's  . LOWER EXTREMITY ANGIOGRAM N/A 05/27/2014   Procedure: LOWER EXTREMITY ANGIOGRAM;  Surgeon: Runell Gess, MD;  Location: [facility] CATH LAB;  Service: Cardiovascular;  Laterality: N/A;  . LOWER EXTREMITY ANGIOGRAPHY Left 09/15/2016   Procedure: Lower Extremity Angiography;  Surgeon: Maeola Harman, MD;  Location: [facility] INVASIVE CV LAB;  Service: Cardiovascular;  Laterality: Left;  . PERIPHERAL VASCULAR ATHERECTOMY  11/10/2016   Procedure: Peripheral Vascular Atherectomy;  Surgeon: Maeola Harman, MD;  Location: [facility] INVASIVE CV LAB;  Service: Cardiovascular;;  . PERIPHERAL VASCULAR INTERVENTION Left 09/15/2016   Procedure: Peripheral Vascular Intervention;  Surgeon: Maeola Harman, MD;  Location: [facility] INVASIVE CV LAB;  Service: Cardiovascular;  Laterality: Left;  SFA/popiteal  . PERIPHERAL VASCULAR INTERVENTION  11/10/2016   Hattie Perch 11/10/2016  . PERIPHERAL VASCULAR INTERVENTION  11/10/2016   Procedure: Peripheral Vascular Intervention;  Surgeon: Maeola Harman, MD;  Location: [facility]  INVASIVE CV LAB;  Service: Cardiovascular;;  . REFRACTIVE SURGERY Bilateral   . RIGHT/LEFT HEART CATH AND CORONARY ANGIOGRAPHY N/A 04/04/2017   Procedure: RIGHT/LEFT HEART CATH AND CORONARY ANGIOGRAPHY;  Surgeon: Marykay Lex, MD;  Location: [facility] INVASIVE CV LAB;  Service: Cardiovascular;  Laterality: N/A;  . STUMP REVISION Right 12/05/2015   Procedure: Revision Right Below Knee Amputation;  Surgeon: Nadara Mustard, MD;  Location: [facility] OR;  Service: Orthopedics;  Laterality: Right;  . STUMP REVISION Left 11/19/2016   Procedure: Revision Left Transmetatarsal Amputation;  Surgeon: Nadara Mustard, MD;  Location: [facility] OR;  Service: Orthopedics;  Laterality: Left;     reports that he quit smoking about 13 months ago. His smoking use included Cigarettes. He has a 57.00 pack-year smoking history. He has never used smokeless tobacco. He reports that he does not drink alcohol or use drugs.  No Known Allergies  Family History  Problem Relation Age of Onset  . Heart failure Father   . Heart attack Father   . Heart failure Brother     Prior to Admission medications   Medication Sig Start Date End Date Taking? Authorizing Provider  albuterol (PROVENTIL HFA;VENTOLIN HFA) 108 (90 BASE) MCG/ACT inhaler Inhale 2 puffs  into the lungs 2 (two) times daily. 0900 & 1700    [provider]  allopurinol (ZYLOPRIM) 100 MG tablet Take 100 mg by mouth daily.     [provider]  aspirin 81 MG chewable tablet Chew 81 mg by mouth daily.    [provider]  atorvastatin (LIPITOR) 20 MG tablet Take 1 tablet (20 mg total) by mouth daily at 6 PM. 04/06/17   Mikhail, Nita Sells, DO  carvedilol (COREG) 3.125 MG tablet Take 1 tablet (3.125 mg total) by mouth 2 (two) times daily. 04/06/17   Edsel Petrin, DO  cholecalciferol (VITAMIN D) 1000 units tablet Take 1,000 Units by mouth daily. (0900)    [provider]  clopidogrel (PLAVIX) 75 MG tablet Take 1 tablet (75 mg total) by mouth daily with  breakfast. 09/16/16   Raymond Gurney, PA-C  ferrous sulfate 325 (65 FE) MG tablet Take 1 tablet (325 mg total) by mouth 2 (two) times daily with a meal. 04/06/17   Mikhail, Nita Sells, DO  furosemide (LASIX) 40 MG tablet Take 1 tablet (40 mg total) by mouth daily. 04/07/17   Mikhail, Nita Sells, DO  gabapentin (NEURONTIN) 100 MG capsule Take 200 mg by mouth at bedtime.     [provider]  guaiFENesin (MUCINEX) 600 MG 12 hr tablet Take 1,200 mg by mouth 2 (two) times daily as needed for cough.    [provider]  ipratropium-albuterol (DUONEB) 0.5-2.5 (3) MG/3ML SOLN Take 3 mLs by nebulization 4 (four) times daily as needed (for shortness of breath).    [provider]  levothyroxine (SYNTHROID, LEVOTHROID) 25 MCG tablet Take 1 tablet (25 mcg total) by mouth daily at 6 (six) AM. 04/06/17   Mikhail, Nita Sells, DO  lisinopril (PRINIVIL,ZESTRIL) 5 MG tablet Take 1 tablet (5 mg total) by mouth daily. 04/07/17   Mikhail, Nita Sells, DO  loperamide (IMODIUM A-D) 2 MG tablet Take 2-4 mg by mouth every 4 (four) hours as needed for diarrhea or loose stools (Do not give if <3 stools or DX. w/c. diff or norovirus. Do not exceed 16 mg/24hrs.).     [provider]  meloxicam (MOBIC) 7.5 MG tablet Take 7.5 mg by mouth daily.     [provider]  methocarbamol (ROBAXIN) 500 MG tablet Take 500 mg by mouth every 6 (six) hours as needed for muscle spasms.     [provider]  Multiple Vitamins-Minerals (MULTIVITAMIN WITH MINERALS) tablet Take 1 tablet by mouth daily. (0900)    [provider]  omeprazole (PRILOSEC) 20 MG capsule Take 20 mg by mouth daily at 6 (six) AM.     [provider]  oxyCODONE (OXY IR/ROXICODONE) 5 MG immediate release tablet Take 1-2 tablets (5-10 mg total) by mouth every 4 (four) hours as needed (  for moderate pain,  for severe pain). 04/06/17   Mikhail, Nita Sells, DO  potassium chloride SA (K-DUR,KLOR-CON) 20 MEQ tablet Take 1 tablet  (20 mEq total) by mouth daily. 04/07/17   Mikhail, Nita Sells, DO  saccharomyces boulardii (FLORASTOR) 250 MG capsule Take 250 mg by mouth 2 (two) times daily.    [provider]  traMADol (ULTRAM) 50 MG tablet Take 1 tablet (50 mg total) by mouth every 6 (six) hours as needed for moderate pain. 04/06/17   Edsel Petrin, DO  traZODone (DESYREL) 50 MG tablet Take 50 mg by mouth at bedtime.     [provider]    Physical Exam: Vitals:   04/20/17 1538 04/20/17 1600 04/20/17 1615  04/20/17 1630  BP: 126/71 132/69    Resp:  (!) SpO2: 99% 99%  100%  Weight:      Height:          Constitutional: NAD, calm, comfortable, appearing Eyes: PERRL, lids and conjunctivae normal ENMT: Mucous membranes are moist.  Respiratory: No wheezing, bibasilar crackles.  Normal respiratory effort. Cardiovascular: Regular rate and rhythm, no murmurs / rubs / gallops.  Trace extremity edema. 2+ pedal pulses.  + JVD Abdomen: no tenderness, no masses palpated. Bowel sounds positive.  Musculoskeletal: no clubbing / cyanosis.  Decreased muscle tone.  Skin: no rashes, lesions, ulcers. No induration Neurologic: non focal  Psychiatric: Normal judgment and insight. Alert and oriented x 3. Normal mood.   Labs on Admission: I have personally reviewed following labs and imaging studies  CBC:  Recent Labs Lab 04/20/17 1556  WBC 9.1  HGB 9.5*  HCT 31.8*  MCV 71.6*  PLT 467*   Basic Metabolic Panel:  Recent Labs Lab 04/20/17 1556  NA 140  K 4.4  CL 103  CO2 27  GLUCOSE 119*  BUN 20  CREATININE 1.17  CALCIUM 8.4*   GFR: Estimated Creatinine Clearance: 53.9 mL/min (by C-G formula based on SCr of 1.17 mg/dL). Liver Function Tests: No results for input(s): AST, ALT, ALKPHOS, BILITOT, PROT, ALBUMIN in the last 168 hours. No results for input(s): LIPASE, AMYLASE in the last 168 hours. No results for input(s): AMMONIA in the last 168 hours. Coagulation Profile: No results for  input(s): INR, PROTIME in the last 168 hours. Cardiac Enzymes: No results for input(s): CKTOTAL, CKMB, CKMBINDEX, TROPONINI in the last 168 hours. BNP (last 3 results) No results for input(s): PROBNP in the last 8760 hours. HbA1C: No results for input(s): HGBA1C in the last 72 hours. CBG: No results for input(s): GLUCAP in the last 168 hours. Lipid Profile: No results for input(s): CHOL, HDL, LDLCALC, TRIG, CHOLHDL, LDLDIRECT in the last 72 hours. Thyroid Function Tests: No results for input(s): TSH, T4TOTAL, FREET4, T3FREE, THYROIDAB in the last 72 hours. Anemia Panel: No results for input(s): VITAMINB12, FOLATE, FERRITIN, TIBC, IRON, RETICCTPCT in the last 72 hours. Urine analysis:    Component Value Date/Time   COLORURINE YELLOW 07/22/2016 1631   APPEARANCEUR CLOUDY (A) 07/22/2016 1631   LABSPEC 1.025 07/22/2016 1631   PHURINE 5.5 07/22/2016 1631   GLUCOSEU NEGATIVE 07/22/2016 1631   HGBUR MODERATE (A) 07/22/2016 1631   BILIRUBINUR NEGATIVE 07/22/2016 1631   KETONESUR NEGATIVE 07/22/2016 1631   PROTEINUR 30 (A) 07/22/2016 1631   UROBILINOGEN >8.0 (H) 05/14/2012 1805   NITRITE NEGATIVE 07/22/2016 1631   LEUKOCYTESUR LARGE (A) 07/22/2016 1631     Radiological Exams on Admission: Dg Chest 2 View  Result Date: 04/20/2017 CLINICAL DATA:  Recurrent pleural effusion. History pneumonia. No chest pain. EXAM: CHEST  2 VIEW COMPARISON:  03/31/2017 FINDINGS: Moderate right pleural effusion. No left pleural effusion. No focal consolidation. No pneumothorax. Stable cardiomediastinal silhouette. No acute osseous abnormality. IMPRESSION: Moderate right pleural effusion. Electronically Signed   By: Elige Ko   On: 04/20/2017 16:26    EKG: Independently reviewed.  Sinus with frequent PVCs  Assessment/Plan Active Problems:   COPD (chronic obstructive pulmonary disease) (HCC)   Malnutrition of moderate degree   Anemia of chronic disease   Adult failure to thrive   Below knee  amputation status (HCC)   Hypothyroidism   CKD (chronic kidney disease) stage 3, GFR 30-59 ml/min (HCC)   PAD (peripheral artery  disease) (HCC)   Recurrent right pleural effusion   Chronic respiratory failure with hypoxia (HCC)   Acute on chronic combined systolic and diastolic CHF (congestive heart failure) (HCC)   Coronary artery disease involving native coronary artery of native heart with unstable angina pectoris (HCC)    Acute on chronic combined systolic and diastolic CHF with recurrent right pleural effusion -Admit patient to the hospital, he has had been having progressive shortness of breath, his BNP is quite elevated.  40 mg of IV Lasix twice daily. -Consulted cardiology, suspect may need further adjustment of his outpatient diuretics versus other interventions. -Continue beta-blocker, continue ACE inhibitor -No need for repeat a 2D echo at this point as it was done last month and showed an EF of 30-35%  Coronary artery disease -Patient with significant CAD, not a candidate for CABG, status post rotational atherectomy on his left circumflex/marginal vessel  COPD with chronic hypoxic respiratory failure -Continue chronic oxygen, no wheezing, does not appear to be an exacerbation  Chronic kidney disease stage III -Creatinine appears at baseline, monitor with diuresis  Hypothyroidism -Resume Synthroid  Adult failure to thrive/severe malnutrition -Dietary consult  Anemia of chronic disease -Hemoglobin appears at baseline, no bleeding  Peripheral vascular disease -Status post bilateral amputations  Goals of care -Patient wishes to stay full code for now, however they may need ongoing discussions regarding this, depending on what cardiology thinks at this point may benefit from palliative care consultation during his hospital stay   DVT prophylaxis: Lovenox  Code Status: Full code  Family Communication: no family at bedside Disposition Plan: admit to telemetry, SNF  when ready  Consults called: cardiology      Admission status: Inpatient     At the time of admission, it appears that the appropriate admission status for this patient is INPATIENT. This is judged to be reasonable and necessary in order to provide the required high service intensity to ensure the patient's safety given the presenting symptoms, physical exam findings, and initial radiographic and laboratory data in the context of their chronic comorbidities. Current circumstances are CHF exacerbation, hypoxic respiratory failure, and it is felt to place patient at high risk for further clinical deterioration threatening life, limb, or organ. Moreover, it is my clinical judgment that the patient will require inpatient hospital care spanning beyond 2 midnights from the point of admission and that early discharge would result in unnecessary risk of decompensation and readmission or threat to life, limb or bodily function.   Pamella Pert, MD [facility] Pager [phone]  If 7PM-7AM, please contact night-coverage [email or web address] Password [email or web address]  04/20/2017, 6:22 PM

## 2017-04-20 NOTE — ED Triage Notes (Signed)
Pt in from [facility] via [facility] with c/o "pleural effusions for re-evaluation". Hx of COPD, wearing 3L [place] currently, sats 95%. A&ox4

## 2017-04-20 NOTE — ED Notes (Signed)
Explained to pt will are strictly monitoring intake/output

## 2017-04-21 ENCOUNTER — Encounter (HOSPITAL_COMMUNITY): Payer: Self-pay | Admitting: General Practice

## 2017-04-21 DIAGNOSIS — I25110 Atherosclerotic heart disease of native coronary artery with unstable angina pectoris: Secondary | ICD-10-CM

## 2017-04-21 LAB — BASIC METABOLIC PANEL
Anion gap: 11 (ref 5–15)
BUN: 21 mg/dL — AB (ref 6–20)
CALCIUM: 8.2 mg/dL — AB (ref 8.9–10.3)
CO2: 28 mmol/L (ref 22–32)
Chloride: 102 mmol/L (ref 101–111)
Creatinine, Ser: 1.09 mg/dL (ref 0.61–1.24)
GFR calc non Af Amer: >60 mL/min (ref >60)
Glucose, Bld: 75 mg/dL (ref 65–99)
Potassium: 3.6 mmol/L (ref 3.5–5.1)
SODIUM: 141 mmol/L (ref 135–145)

## 2017-04-21 MED ORDER — PANTOPRAZOLE SODIUM 40 MG PO TBEC
40.0000 mg | DELAYED_RELEASE_TABLET | Freq: Every day | ORAL | Status: DC
  Administered 2017-04-21 – 2017-04-22 (×2): 40 mg via ORAL
  Filled 2017-04-21 (×2): qty 1

## 2017-04-21 MED ORDER — CARVEDILOL 3.125 MG PO TABS
3.1250 mg | ORAL_TABLET | Freq: Two times a day (BID) | ORAL | Status: DC
  Administered 2017-04-21 – 2017-04-22 (×3): 3.125 mg via ORAL
  Filled 2017-04-21 (×3): qty 1

## 2017-04-21 MED ORDER — TRAMADOL HCL 50 MG PO TABS: 50.0000 mg | ORAL_TABLET | Freq: Four times a day (QID) | ORAL | Status: DC | PRN

## 2017-04-21 MED ORDER — LISINOPRIL 5 MG PO TABS
5.0000 mg | ORAL_TABLET | Freq: Every day | ORAL | Status: DC
  Administered 2017-04-21 – 2017-04-22 (×2): 5 mg via ORAL
  Filled 2017-04-21 (×2): qty 1

## 2017-04-21 MED ORDER — SODIUM CHLORIDE 0.9% FLUSH
3.0000 mL | Freq: Two times a day (BID) | INTRAVENOUS | Status: DC
  Administered 2017-04-21 – 2017-04-22 (×3): 3 mL via INTRAVENOUS

## 2017-04-21 MED ORDER — GUAIFENESIN ER 600 MG PO TB12: 1200.0000 mg | ORAL_TABLET | Freq: Two times a day (BID) | ORAL | Status: DC | PRN

## 2017-04-21 MED ORDER — SACCHAROMYCES BOULARDII 250 MG PO CAPS
250.0000 mg | ORAL_CAPSULE | Freq: Two times a day (BID) | ORAL | Status: DC
  Administered 2017-04-21 – 2017-04-22 (×3): 250 mg via ORAL
  Filled 2017-04-21 (×4): qty 1

## 2017-04-21 MED ORDER — SODIUM CHLORIDE 0.9% FLUSH: 3.0000 mL | INTRAVENOUS | Status: DC | PRN

## 2017-04-21 MED ORDER — SODIUM CHLORIDE 0.9 % IV SOLN: 250.0000 mL | INTRAVENOUS | Status: DC | PRN

## 2017-04-21 MED ORDER — ACETAMINOPHEN 325 MG PO TABS: 650.0000 mg | ORAL_TABLET | ORAL | Status: DC | PRN

## 2017-04-21 MED ORDER — FUROSEMIDE 10 MG/ML IJ SOLN
40.0000 mg | Freq: Two times a day (BID) | INTRAMUSCULAR | Status: DC
  Administered 2017-04-21 – 2017-04-22 (×3): 40 mg via INTRAVENOUS
  Filled 2017-04-21 (×3): qty 4

## 2017-04-21 MED ORDER — FERROUS SULFATE 325 (65 FE) MG PO TABS
325.0000 mg | ORAL_TABLET | Freq: Two times a day (BID) | ORAL | Status: DC
  Administered 2017-04-21 – 2017-04-22 (×3): 325 mg via ORAL
  Filled 2017-04-21 (×4): qty 1

## 2017-04-21 MED ORDER — GABAPENTIN 100 MG PO CAPS
200.0000 mg | ORAL_CAPSULE | Freq: Every day | ORAL | Status: DC
  Administered 2017-04-21 – 2017-04-22 (×2): 200 mg via ORAL
  Filled 2017-04-21 (×2): qty 2

## 2017-04-21 MED ORDER — ATORVASTATIN CALCIUM 20 MG PO TABS
20.0000 mg | ORAL_TABLET | Freq: Every day | ORAL | Status: DC
  Administered 2017-04-21: 20 mg via ORAL
  Filled 2017-04-21: qty 1

## 2017-04-21 MED ORDER — OXYCODONE HCL 5 MG PO TABS: 5.0000 mg | ORAL_TABLET | ORAL | Status: DC | PRN

## 2017-04-21 MED ORDER — METHOCARBAMOL 500 MG PO TABS: 500.0000 mg | ORAL_TABLET | Freq: Four times a day (QID) | ORAL | Status: DC | PRN

## 2017-04-21 MED ORDER — ONDANSETRON HCL 4 MG/2ML IJ SOLN: 4.0000 mg | Freq: Four times a day (QID) | INTRAMUSCULAR | Status: DC | PRN

## 2017-04-21 MED ORDER — POTASSIUM CHLORIDE CRYS ER 20 MEQ PO TBCR
20.0000 meq | EXTENDED_RELEASE_TABLET | Freq: Two times a day (BID) | ORAL | Status: DC
  Administered 2017-04-21 – 2017-04-22 (×3): 20 meq via ORAL
  Filled 2017-04-21 (×3): qty 1

## 2017-04-21 MED ORDER — VITAMIN D 1000 UNITS PO TABS
1000.0000 [IU] | ORAL_TABLET | Freq: Every day | ORAL | Status: DC
  Administered 2017-04-21 – 2017-04-22 (×2): 1000 [IU] via ORAL
  Filled 2017-04-21 (×2): qty 1

## 2017-04-21 MED ORDER — TRAZODONE HCL 50 MG PO TABS
50.0000 mg | ORAL_TABLET | Freq: Every day | ORAL | Status: DC
Start: 2017-04-21 — End: 2017-04-22
  Administered 2017-04-21 – 2017-04-22 (×2): 50 mg via ORAL
  Filled 2017-04-21 (×2): qty 1

## 2017-04-21 MED ORDER — ENOXAPARIN SODIUM 40 MG/0.4ML ~~LOC~~ SOLN
40.0000 mg | Freq: Every day | SUBCUTANEOUS | Status: DC
  Administered 2017-04-21 – 2017-04-22 (×2): 40 mg via SUBCUTANEOUS
  Filled 2017-04-21 (×3): qty 0.4

## 2017-04-21 MED ORDER — ALLOPURINOL 100 MG PO TABS
100.0000 mg | ORAL_TABLET | Freq: Every day | ORAL | Status: DC
  Administered 2017-04-21 – 2017-04-22 (×2): 100 mg via ORAL
  Filled 2017-04-21 (×3): qty 1

## 2017-04-21 MED ORDER — LEVOTHYROXINE SODIUM 25 MCG PO TABS
25.0000 ug | ORAL_TABLET | Freq: Every day | ORAL | Status: DC
  Administered 2017-04-21 – 2017-04-22 (×2): 25 ug via ORAL
  Filled 2017-04-21 (×3): qty 1

## 2017-04-21 MED ORDER — ASPIRIN 81 MG PO CHEW
81.0000 mg | CHEWABLE_TABLET | Freq: Every day | ORAL | Status: DC
  Administered 2017-04-21 – 2017-04-22 (×2): 81 mg via ORAL
  Filled 2017-04-21 (×2): qty 1

## 2017-04-21 MED ORDER — CLOPIDOGREL BISULFATE 75 MG PO TABS
75.0000 mg | ORAL_TABLET | Freq: Every day | ORAL | Status: DC
  Administered 2017-04-21 – 2017-04-22 (×2): 75 mg via ORAL
  Filled 2017-04-21 (×2): qty 1

## 2017-04-21 NOTE — ED Notes (Signed)
Attempted to call report

## 2017-04-21 NOTE — Progress Notes (Signed)
PROGRESS NOTE  Caleb Hays ZOX:[id number] DOB: Nov 24, 1940 DOA: 04/20/2017 PCP: Patient, No Pcp Per   LOS: 1 day   Brief Narrative / Interim history: Caleb Hays is a 76 y.o. male with medical history significant of coronary artery disease, COPD, tobacco abuse in remission, hypothyroidism, peripheral arterial disease status post bilateral amputations, chronic systolic and diastolic CHF, presents to the emergency room with chief complaint of shortness of breath. Found to have recurrent right sided pleural effusion  Assessment & Plan: Active Problems:   COPD (chronic obstructive pulmonary disease) (HCC)   Malnutrition of moderate degree   Anemia of chronic disease   Adult failure to thrive   Below knee amputation status (HCC)   Hypothyroidism   CKD (chronic kidney disease) stage 3, GFR 30-59 ml/min (HCC)   PAD (peripheral artery disease) (HCC)   Recurrent right pleural effusion   Chronic respiratory failure with hypoxia (HCC)   Acute on chronic combined systolic and diastolic CHF (congestive heart failure) (HCC)   Coronary artery disease involving native coronary artery of native heart with unstable angina pectoris (HCC)   Acute on chronic combined systolic and diastolic CHF with recurrent right pleural effusion -Patient started on 40 mg of IV Lasix BID, continue, he is net -1.5 L his weight went from 154 on admission to 147 today -Awaiting cardiology input -Continue beta-blocker, continue ACE inhibitors -No need for repeat a 2D echo at this point as it was done last month and showed an EF of 30-35%  Coronary artery disease -Patient with significant CAD, not a candidate for CABG, status post rotational atherectomy on his left circumflex/marginal vessel -No chest pain this morning, troponin negative in the ED  COPD with chronic hypoxic respiratory failure -Continue chronic oxygen, no wheezing, does not appear to be an exacerbation  Chronic kidney disease stage  III -Creatinine appears at baseline, is remaining stable with diuresis, 1.09 this morning.  BUN 21.  Hypothyroidism -Continue Synthroid  Adult failure to thrive/severe malnutrition -Dietary consult  Anemia of chronic disease -Hemoglobin appears at baseline, no bleeding  Peripheral vascular disease -Status post bilateral amputations  Goals of care -Patient wishes to stay full code for now, however they may need ongoing discussions regarding this, depending on what cardiology thinks at this point may benefit from palliative care consultation during his hospital stay   DVT prophylaxis: Lovenox Code Status: Full code Family Communication: no family at bedside Disposition Plan: SNF when ready   Consultants:   Cardiology   Procedures:   None   Antimicrobials:  None   Subjective: - no chest pain, no abdominal pain, nausea or vomiting. Still feels short of breath, but improved  Objective: Vitals:   04/21/17 0700 04/21/17 0800 04/21/17 0846 04/21/17 0922  BP: 122/66 113/63 126/60 129/68  Pulse: (!) 55 (!) 58 61 64  Resp: 15 14    Temp:    97.7 F (36.5 C)  TempSrc:    Oral  SpO2: 95% 95%  96%  Weight:    67 kg (147 lb 11.2 oz)  Height:        Intake/Output Summary (Last 24 hours) at 04/21/17 1242 Last data filed at 04/21/17 0956  Gross per 24 hour  Intake              480 ml  Output             2150 ml  Net            -1670 ml  Filed Weights   04/20/17 1529 04/21/17 0922  Weight: 69.9 kg (154 lb) 67 kg (147 lb 11.2 oz)    Examination:  Constitutional: NAD Eyes: lids and conjunctivae normal Respiratory: clear to auscultation bilaterally, no wheezing, no crackles.  Cardiovascular: Regular rate and rhythm, no murmurs / rubs / gallops. No LE edema. Abdomen: no tenderness. Bowel sounds positive.  Neurologic: CN 2-12 grossly intact. Strength 5/5 in all 4.  Psychiatric: Normal judgment and insight. Alert and oriented x 3. Normal mood.    Data  Reviewed: I have independently reviewed following labs and imaging studies   CBC:  Recent Labs Lab 04/20/17 1556  WBC 9.1  HGB 9.5*  HCT 31.8*  MCV 71.6*  PLT 467*   Basic Metabolic Panel:  Recent Labs Lab 04/20/17 1556 04/21/17 0425  NA 140 141  K 4.4 3.6  CL 103 102  CO2 27 28  GLUCOSE 119* 75  BUN 20 21*  CREATININE 1.17 1.09  CALCIUM 8.4* 8.2*   GFR: Estimated Creatinine Clearance: 55.5 mL/min (by C-G formula based on SCr of 1.09 mg/dL). Liver Function Tests: No results for input(s): AST, ALT, ALKPHOS, BILITOT, PROT, ALBUMIN in the last 168 hours. No results for input(s): LIPASE, AMYLASE in the last 168 hours. No results for input(s): AMMONIA in the last 168 hours. Coagulation Profile: No results for input(s): INR, PROTIME in the last 168 hours. Cardiac Enzymes: No results for input(s): CKTOTAL, CKMB, CKMBINDEX, TROPONINI in the last 168 hours. BNP (last 3 results) No results for input(s): PROBNP in the last 8760 hours. HbA1C: No results for input(s): HGBA1C in the last 72 hours. CBG: No results for input(s): GLUCAP in the last 168 hours. Lipid Profile: No results for input(s): CHOL, HDL, LDLCALC, TRIG, CHOLHDL, LDLDIRECT in the last 72 hours. Thyroid Function Tests: No results for input(s): TSH, T4TOTAL, FREET4, T3FREE, THYROIDAB in the last 72 hours. Anemia Panel: No results for input(s): VITAMINB12, FOLATE, FERRITIN, TIBC, IRON, RETICCTPCT in the last 72 hours. Urine analysis:    Component Value Date/Time   COLORURINE YELLOW 07/22/2016 1631   APPEARANCEUR CLOUDY (A) 07/22/2016 1631   LABSPEC 1.025 07/22/2016 1631   PHURINE 5.5 07/22/2016 1631   GLUCOSEU NEGATIVE 07/22/2016 1631   HGBUR MODERATE (A) 07/22/2016 1631   BILIRUBINUR NEGATIVE 07/22/2016 1631   KETONESUR NEGATIVE 07/22/2016 1631   PROTEINUR 30 (A) 07/22/2016 1631   UROBILINOGEN >8.0 (H) 05/14/2012 1805   NITRITE NEGATIVE 07/22/2016 1631   LEUKOCYTESUR LARGE (A) 07/22/2016 1631    Sepsis Labs: Invalid input(s): PROCALCITONIN, LACTICIDVEN  No results found for this or any previous visit (from the past 240 hour(s)).    Radiology Studies: Dg Chest 2 View  Result Date: 04/20/2017 CLINICAL DATA:  Recurrent pleural effusion. History pneumonia. No chest pain. EXAM: CHEST  2 VIEW COMPARISON:  03/31/2017 FINDINGS: Moderate right pleural effusion. No left pleural effusion. No focal consolidation. No pneumothorax. Stable cardiomediastinal silhouette. No acute osseous abnormality. IMPRESSION: Moderate right pleural effusion. Electronically Signed   By: Elige Ko   On: 04/20/2017 16:26     Scheduled Meds: . allopurinol  100 mg Oral Daily  . aspirin  81 mg Oral Daily  . atorvastatin  20 mg Oral q1800  . carvedilol  3.125 mg Oral BID  . cholecalciferol  1,000 Units Oral Daily  . clopidogrel  75 mg Oral Q breakfast  . enoxaparin (LOVENOX) injection  40 mg Subcutaneous Daily  . ferrous sulfate  325 mg Oral BID WC  . furosemide  40  mg Intravenous Q12H  . gabapentin  200 mg Oral QHS  . levothyroxine  25 mcg Oral Q0600  . lisinopril  5 mg Oral Daily  . pantoprazole  40 mg Oral Daily  . saccharomyces boulardii  250 mg Oral BID  . sodium chloride flush  3 mL Intravenous Q12H  . traZODone  50 mg Oral QHS   Continuous Infusions: . sodium chloride       Pamella Pert, MD, PhD [facility] Pager [phone]  If 7PM-7AM, please contact night-coverage [email or web address] Password [email or web address] 04/21/2017, 12:42 PM

## 2017-04-21 NOTE — Consult Note (Signed)
Cardiology Consultation:   Patient ID: Caleb Hays; [id number]; 12-28-1940   Admit date: 04/20/2017 Date of Consult: 04/21/2017  Primary Care Provider: Patient, No Pcp Per Primary Cardiologist: Dr. Allyson Sabal Primary Electrophysiologist:     Patient Profile:   Caleb Hays is a 76 y.o. male with a hx of COPD, PVD s/p right BKA, CAD s/p recent stents placed to OM1 (04/05/17), who is being seen today for the evaluation of recurrent pleural effusion at the request of Gherghe.  History of Present Illness:   Caleb Hays was recently hospitalized 03/17/17-03/22/17 for shortness of breath, right pleural effusion, and HCAP. He was treated with ABX and underwent thoracentesis with 2 L pleural fluid drained. Fluid had no growth on gram stain and was supposed to follow up with pulmonology OP.   Caleb Hays was again hospitalized on 03/30/17-04/06/17 with shortness of breath and chest pain. He had thoracentesis with 2L of fluid removed from right pleural space. Fluid had no growth on gram stain and culture was negative. He also had a left heart cath on 04/04/17 with multi-vessel disease including proximal LAD. TCTS was consulted and he was deemed not a candidate for CABG. He returned to the cath lab on 04/05/17 and 2 stents placed to the OM1. He recovered well and was discharged 04/06/17 back to SNF.  He returned to [facility]/20/17 from SNF after a repeat CXR showed a moderate right pleural effuison. He was sent to ED. On arrival, he required 3L of O2, up from baseline of 2L O2. He denies shortness of breath, orthopnea and lower extremity swelling. He denies chest pain, palpitations, dizziness, and syncope.    Past Medical History:  Diagnosis Date  . Acute on chronic combined systolic (congestive) and diastolic (congestive) heart failure (HCC) 04/2017  . Adult failure to thrive    /notes 06/23/2015  . Anemia   . Arthritis    "hands, fingers" (11/10/2016  . CKD (chronic kidney disease), stage III (HCC)     . COPD (chronic obstructive pulmonary disease) (HCC)    LONG TIME SMOKER  . Critical lower limb ischemia   . 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 Depression    "periods of depression" (06/23/2015)  . Gout   . Hypothyroidism   . PAD (peripheral artery disease) (HCC)   . Physical deconditioning   . Pyelonephritis 06/23/2015  . Status post foot surgery    right fifth toe amputation by Dr. Lajoyce Corners     Past Surgical History:  Procedure Laterality Date  . ABDOMINAL AORTOGRAM W/LOWER EXTREMITY N/A 09/03/2016   Procedure: Abdominal Aortogram w/Lower Extremity;  Surgeon: Sherren Kerns, MD;  Location: [facility] INVASIVE CV LAB;  Service: Cardiovascular;  Laterality: N/A;  Lt. leg  . ABDOMINAL AORTOGRAM W/LOWER EXTREMITY N/A 11/10/2016   Procedure: Abdominal Aortogram w/Lower Extremity;  Surgeon: Maeola Harman, MD;  Location: [facility] INVASIVE CV LAB;  Service: Cardiovascular;  Laterality: N/A;  . AMPUTATION Right 03/01/2014   Procedure: AMPUTATION RAY;  Surgeon: Nadara Mustard, MD;  Location: [facility] OR;  Service: Orthopedics;  Laterality: Right;  Right Foot 5th Ray Amputation  . AMPUTATION Right 04/26/2014   Procedure: Right Foot 4th Ray Amputation;  Surgeon: Nadara Mustard, MD;  Location: [facility] OR;  Service: Orthopedics;  Laterality: Right;  . AMPUTATION Right 10/01/2015   Procedure: Right Below Knee Amputation;  Surgeon: Nadara Mustard, MD;  Location: [facility] OR;  Service: Orthopedics;  Laterality: Right;  . AMPUTATION  Left 10/08/2016   Procedure: Left Transmetatarsal Amputation;  Surgeon: Nadara Mustard, MD;  Location: [facility] OR;  Service: Orthopedics;  Laterality: Left;  . AMPUTATION Left 07/23/2016   Procedure: Left 2nd Toe Amputation;  Surgeon: Nadara Mustard, MD;  Location: [facility] OR;  Service: Orthopedics;  Laterality: Left;  . ANKLE ARTHROSCOPY Right 08/13/2015   Procedure: ANKLE ARTHROSCOPY;  Surgeon: Nadara Mustard, MD;  Location: [facility] OR;  Service: Orthopedics;  Laterality: Right;  .  CATARACT EXTRACTION W/ INTRAOCULAR LENS  IMPLANT, BILATERAL Bilateral   . CORONARY ATHERECTOMY N/A 04/05/2017   Procedure: CORONARY ATHERECTOMY;  Surgeon: Marykay Lex, MD;  Location: [facility] INVASIVE CV LAB;  Service: Cardiovascular;  Laterality: N/A;  . CORONARY STENT INTERVENTION N/A 04/05/2017   Procedure: CORONARY STENT INTERVENTION;  Surgeon: Marykay Lex, MD;  Location: [facility] INVASIVE CV LAB;  Service: Cardiovascular;  Laterality: N/A;  . INGUINAL HERNIA REPAIR Left   . IR THORACENTESIS ASP PLEURAL SPACE W/IMG GUIDE  03/18/2017  . KNEE CARTILAGE SURGERY Left 1960's   football injury  . KNEE LIGAMENT RECONSTRUCTION Left 1960's  . LOWER EXTREMITY ANGIOGRAM N/A 05/27/2014   Procedure: LOWER EXTREMITY ANGIOGRAM;  Surgeon: Runell Gess, MD;  Location: [facility] CATH LAB;  Service: Cardiovascular;  Laterality: N/A;  . LOWER EXTREMITY ANGIOGRAPHY Left 09/15/2016   Procedure: Lower Extremity Angiography;  Surgeon: Maeola Harman, MD;  Location: [facility] INVASIVE CV LAB;  Service: Cardiovascular;  Laterality: Left;  . PERIPHERAL VASCULAR ATHERECTOMY  11/10/2016   Procedure: Peripheral Vascular Atherectomy;  Surgeon: Maeola Harman, MD;  Location: [facility] INVASIVE CV LAB;  Service: Cardiovascular;;  . PERIPHERAL VASCULAR INTERVENTION Left 09/15/2016   Procedure: Peripheral Vascular Intervention;  Surgeon: Maeola Harman, MD;  Location: [facility] INVASIVE CV LAB;  Service: Cardiovascular;  Laterality: Left;  SFA/popiteal  . PERIPHERAL VASCULAR INTERVENTION  11/10/2016   Hattie Perch 11/10/2016  . PERIPHERAL VASCULAR INTERVENTION  11/10/2016   Procedure: Peripheral Vascular Intervention;  Surgeon: Maeola Harman, MD;  Location: [facility] INVASIVE CV LAB;  Service: Cardiovascular;;  . REFRACTIVE SURGERY Bilateral   . RIGHT/LEFT HEART CATH AND CORONARY ANGIOGRAPHY N/A 04/04/2017   Procedure: RIGHT/LEFT HEART CATH AND CORONARY ANGIOGRAPHY;  Surgeon: Marykay Lex, MD;  Location: [facility] INVASIVE CV LAB;   Service: Cardiovascular;  Laterality: N/A;  . STUMP REVISION Right 12/05/2015   Procedure: Revision Right Below Knee Amputation;  Surgeon: Nadara Mustard, MD;  Location: [facility] OR;  Service: Orthopedics;  Laterality: Right;  . STUMP REVISION Left 11/19/2016   Procedure: Revision Left Transmetatarsal Amputation;  Surgeon: Nadara Mustard, MD;  Location: [facility] OR;  Service: Orthopedics;  Laterality: Left;     Home Medications:  Prior to Admission medications   Medication Sig Start Date End Date Taking? Authorizing Provider  albuterol (PROVENTIL HFA;VENTOLIN HFA) 108 (90 BASE) MCG/ACT inhaler Inhale 2 puffs into the lungs 2 (two) times daily. 0900 & 1700   Yes [provider]  allopurinol (ZYLOPRIM) 100 MG tablet Take 100 mg by mouth daily.    Yes [provider]  aspirin 81 MG chewable tablet Chew 81 mg by mouth daily.   Yes [provider]  atorvastatin (LIPITOR) 20 MG tablet Take 1 tablet (20 mg total) by mouth daily at 6 PM. 04/06/17  Yes Mikhail, Nita Sells, DO  carvedilol (COREG) 3.125 MG tablet Take 1 tablet (3.125 mg total) by mouth 2 (two) times daily. 04/06/17  Yes Mikhail, Nita Sells, DO  cholecalciferol (VITAMIN D) 1000 units tablet Take 1,000 Units by mouth  daily. (0900)   Yes [provider]  clopidogrel (PLAVIX) 75 MG tablet Take 1 tablet (75 mg total) by mouth daily with breakfast. 09/16/16  Yes Trinh, Kimberly A, PA-C  ferrous sulfate 325 (65 FE) MG tablet Take 1 tablet (325 mg total) by mouth 2 (two) times daily with a meal. 04/06/17  Yes Mikhail, Maryann, DO  furosemide (LASIX) 40 MG tablet Take 1 tablet (40 mg total) by mouth daily. 04/07/17  Yes Mikhail, Maryann, DO  gabapentin (NEURONTIN) 100 MG capsule Take 200 mg by mouth at bedtime.    Yes [provider]  levothyroxine (SYNTHROID, LEVOTHROID) 25 MCG tablet Take 1 tablet (25 mcg total) by mouth daily at 6 (six) AM. 04/06/17  Yes Mikhail, Nita Sells, DO  lisinopril (PRINIVIL,ZESTRIL) 5 MG tablet Take 1  tablet (5 mg total) by mouth daily. 04/07/17  Yes Mikhail, Nita Sells, DO  meloxicam (MOBIC) 7.5 MG tablet Take 7.5 mg by mouth daily.    Yes [provider]  Multiple Vitamins-Minerals (MULTIVITAMIN WITH MINERALS) tablet Take 1 tablet by mouth daily. (0900)   Yes [provider]  omeprazole (PRILOSEC) 20 MG capsule Take 20 mg by mouth daily at 6 (six) AM.    Yes [provider]  potassium chloride SA (K-DUR,KLOR-CON) 20 MEQ tablet Take 1 tablet (20 mEq total) by mouth daily. 04/07/17  Yes Mikhail, Bicknell, DO  saccharomyces boulardii (FLORASTOR) 250 MG capsule Take 250 mg by mouth 2 (two) times daily.   Yes [provider]  traZODone (DESYREL) 50 MG tablet Take 50 mg by mouth at bedtime.    Yes [provider]  guaiFENesin (MUCINEX) 600 MG 12 hr tablet Take 1,200 mg by mouth 2 (two) times daily as needed for cough.    [provider]  ipratropium-albuterol (DUONEB) 0.5-2.5 (3) MG/3ML SOLN Take 3 mLs by nebulization 4 (four) times daily as needed (for shortness of breath).    [provider]  methocarbamol (ROBAXIN) 500 MG tablet Take 500 mg by mouth every 6 (six) hours as needed for muscle spasms.     [provider]  oxyCODONE (OXY IR/ROXICODONE) 5 MG immediate release tablet Take 1-2 tablets (5-10 mg total) by mouth every 4 (four) hours as needed (5mg  for moderate pain, 10mg  for severe pain). Patient taking differently: Take 10 mg by mouth every 4 (four) hours as needed for severe pain.  04/06/17   Mikhail, Nita Sells, DO  traMADol (ULTRAM) 50 MG tablet Take 1 tablet (50 mg total) by mouth every 6 (six) hours as needed for moderate pain. 04/06/17   Edsel Petrin, DO    Inpatient Medications: Scheduled Meds: . allopurinol  100 mg Oral Daily  . aspirin  81 mg Oral Daily  . atorvastatin  20 mg Oral q1800  . carvedilol  3.125 mg Oral BID  . cholecalciferol  1,000 Units Oral Daily  . clopidogrel  75 mg Oral Q breakfast  .  enoxaparin (LOVENOX) injection  40 mg Subcutaneous Daily  . ferrous sulfate  325 mg Oral BID WC  . furosemide  40 mg Intravenous Q12H  . gabapentin  200 mg Oral QHS  . levothyroxine  25 mcg Oral Q0600  . lisinopril  5 mg Oral Daily  . pantoprazole  40 mg Oral Daily  . saccharomyces boulardii  250 mg Oral BID  . sodium chloride flush  3 mL Intravenous Q12H  . traZODone  50 mg Oral QHS   Continuous Infusions: . sodium chloride     PRN Meds: sodium  chloride, acetaminophen, guaiFENesin, ipratropium-albuterol, methocarbamol, ondansetron (ZOFRAN) IV, oxyCODONE, sodium chloride flush, traMADol  Allergies:   No Known Allergies  Social History:   Social History   Social History  . Marital status: Single    Spouse name: N/A  . Number of children: N/A  . Years of education: N/A   Occupational History  . Not on file.   Social History Main Topics  . Smoking status: Former Smoker    Packs/day: 1.00    Years: 57.00    Types: Cigarettes    Quit date: 03/19/2016  . Smokeless tobacco: Never Used  . Alcohol use No     Comment: 11/10/2016 I was drinking alot of beer; quit in 2015"   . Drug use: No  . Sexual activity: No   Other Topics Concern  . Not on file   Social History Narrative  . No narrative on file    Family History:    Family History  Problem Relation Age of Onset  . Heart failure Father   . Heart attack Father   . Heart failure Brother      ROS:  Please see the history of present illness.  ROS  All other ROS reviewed and negative.     Physical Exam/Data:   Vitals:   04/21/17 0700 04/21/17 0800 04/21/17 0846 04/21/17 0922  BP: 122/66 113/63 126/60 129/68  Pulse: (!) 55 (!) 58 61 64  Resp: 15 14    Temp:    97.7 F (36.5 C)  TempSrc:    Oral  SpO2: 95% 95%  96%  Weight:    147 lb 11.2 oz (67 kg)  Height:        Intake/Output Summary (Last 24 hours) at 04/21/17 1217 Last data filed at 04/21/17 0956  Gross per 24 hour  Intake              480 ml    Output             2150 ml  Net            -1670 ml   Filed Weights   04/20/17 1529 04/21/17 0922  Weight: 154 lb (69.9 kg) 147 lb 11.2 oz (67 kg)   Body mass index is 17.98 kg/m.  General: cachexic male, in no acute distress HEENT: normal, no teeth Neck: no JVD Vascular: No carotid bruits Cardiac:  Irregular rhythm, regular rate Lungs:  Diminished throughout, crackles in both bases, poor breath sounds in right base Abd: soft, nontender, no hepatomegaly  Ext: no edema Musculoskeletal:  Right BKA, left partial foot amputation Skin: warm and dry  Neuro:  CNs 2-12 intact, no focal abnormalities noted Psych:  Normal affect   EKG:  The EKG was personally reviewed and demonstrates:  Afib vs sinus with premature beats Telemetry:  Telemetry was personally reviewed and demonstrates:  Sinus with PVCs,   Relevant CV Studies:  Heart cath 04/05/17:  Lesion #1: 1st Mrg lesion, 65 %stenosed. Calcified  Following Orbital Atherectomy, A STENT SIERRA 2.50 X 23 MM drug eluting stent (post-dilated to 2.6 mm) was successfully placed.  Post intervention, there is a 0% residual stenosis.  Lesion #2: Prox Cx lesion, 95 %stenosed. Calcified.  Following Orbital Atherectomy, A STENT SIERRA 3.00 X 23 MM drug eluting stent (post-dilated to 3.25 mm) was successfully placed, and overlaps previously placed stent in OM1.  Post intervention, there is a 0% residual stenosis.  Mid Cx lesion, 50 %stenosed. - now jailed by stent  LM  lesion, 60 %stenosed. - Medical Management for now.  Ost Ramus to Ramus lesion, 90 %stenosed.   Successful 2 site Orbital Atherectomy supported PCI of proximal circumflex into OM 1 crossing the AV groove branch with 2 Overlapping Xience Sierra DES stents.  Plan:   Return to [facility] Stepdown Unit for Post PCI care & TR Band Removal.  Continue ASA/Plavix  Continue CHF management   Left heart cath 04/04/17:  LM lesion, 60 %stenosed. Calcified concentric.  Prox Cx lesion,  95 %stenosed. Mid Cx to Dist Cx lesion, 50 %stenosed.  Ramus lesion, 95 %stenosed.  Ost LAD lesion, 40 %stenosed.  Prox RCA lesion, 95 %stenosed.- likely non-dominant small caliber vessel.  There is severe left ventricular systolic dysfunction. The left ventricular ejection fraction is less than 25% by visual estimate.  LV end diastolic pressure is mildly elevated, but mosty normal Right Heart Pressures - indicates adequate diuresis.  There is moderate (3+) mitral regurgitation.   The patient has visible calcification on his proximal right and left coronary artery system. There is distal Left Main stenosis with severe disease in the proximal Circumflex and proximal Ramus Intermedius. The LAD itself is a relatively good target as is the distal Circumflex and Ramus.  Right Heart Pressures PAP/mean: 31/8/17 mmHg PCWP: 22 mmHg Elevated LV EDP consistent with volume overload. LVP/EDP: 118/4/12 mmHg AoP/MAP: 118/55/78 mmHg CO/CI FICK: 5.54 / 2.81 mmHg PA Sat 58%, Ao Sat 95%    Right Atrium Right atrial pressure is normal. Mean RAP 4 mmHg    Right Ventricle RVP/EDP 28/0/3 mmHg      Ideally, he would be best served with CABG however understandably may not be the greatest of CABG candidate. If he has turned down for bypass surgery, would need to discuss with interventional colleagues as it appears he may potentially need atherectomy base PCI of at least the Circumflex and Ramus lesions. If the decision is made to do Left Main stenting, with and without atherectomy, he would likely require left ventricular support given his reduced ejection fraction.  For now he'll be transferred to [facility] 2C  Stepdown Unit, and restarted on IV heparin until a decision is made about possible CABG. I have stopped Plavix.   Echocardiogram 04/17/17: Study Conclusions - Left ventricle: The cavity size was normal. Wall thickness was   normal. Systolic function was moderately to severely reduced. The    estimated ejection fraction was in the range of 30% to 35%.   Features are consistent with a pseudonormal left ventricular   filling pattern, with concomitant abnormal relaxation and   increased filling pressure (grade 2 diastolic dysfunction). - Aortic valve: There was very mild stenosis. Valve area (VTI):   1.15 cm^2. Valve area (Vmax): 1.08 cm^2. Valve area (Vmean): 1.04   cm^2. - Mitral valve: There was moderate regurgitation. - Left atrium: The atrium was severely dilated. - Right ventricle: Systolic function was mildly reduced. - Right atrium: The atrium was moderately to severely dilated. - Tricuspid valve: There was mild-moderate regurgitation. - Pulmonary arteries: Systolic pressure was moderately increased.   PA peak pressure: 57 mm Hg (S).  Laboratory Data:  Chemistry Recent Labs Lab 04/20/17 1556 04/21/17 0425  NA 140 141  K 4.4 3.6  CL 103 102  CO2 27 28  GLUCOSE 119* 75  BUN 20 21*  CREATININE 1.17 1.09  CALCIUM 8.4* 8.2*  GFRNONAA 59* >60  GFRAA >60 >60  ANIONGAP 10 11    No results for input(s): PROT, ALBUMIN, AST, ALT,  ALKPHOS, BILITOT in the last 168 hours. Hematology Recent Labs Lab 04/20/17 1556  WBC 9.1  RBC 4.44  HGB 9.5*  HCT 31.8*  MCV 71.6*  MCH 21.4*  MCHC 29.9*  RDW 19.1*  PLT 467*   Cardiac EnzymesNo results for input(s): TROPONINI in the last 168 hours.  Recent Labs Lab 04/20/17 1608  TROPIPOC 0.01    BNP Recent Labs Lab 04/20/17 1536  BNP 1,919.0*    DDimer No results for input(s): DDIMER in the last 168 hours.  Radiology/Studies:  Dg Chest 2 View  Result Date: 04/20/2017 CLINICAL DATA:  Recurrent pleural effusion. History pneumonia. No chest pain. EXAM: CHEST  2 VIEW COMPARISON:  03/31/2017 FINDINGS: Moderate right pleural effusion. No left pleural effusion. No focal consolidation. No pneumothorax. Stable cardiomediastinal silhouette. No acute osseous abnormality. IMPRESSION: Moderate right pleural effusion.  Electronically Signed   By: Elige Ko   On: 04/20/2017 16:26    Assessment and Plan:   1. Acute on chronic systolic and diastolic heart failure with recurrent right pleural effusion - BNP on admission 1919.0 with sCr 1.17 - CXR with moderate right pleural effusion - he was previously discharged on 40 mg PO lasix daily; discharge weight was near 70 Kg (154 lbs); admission weight this hospitalization was 154 lbs but is 147 lbs documented today - daily weights - agree with 40 mg IV lasix BID, added potassium supplementation - overall net negative 1.5L with 2L urine output yesterday - continue ASA, plavix, lipitor, coreg, lisinopril  2. CAD s/p DES x 2 to OM1 (04/05/17) - continue ASA and plavix - pt denies chest pain  3. COPD - stable, increased O2 requirements likely due to pleural effusion, not thought to be in a COPD exacerbation - per primary team  4. Irregular rhythm - telemetry and EKG difficult to interpret - will repeat a 12-lead EKG to eval for Afib vs sinus with premature beats   For questions or updates, please contact [facility] Please consult [email or web address] for contact info under Cardiology/STEMI.   Signed, Roe Rutherford Duke, PA  04/21/2017 12:17 PM

## 2017-04-22 ENCOUNTER — Inpatient Hospital Stay (HOSPITAL_COMMUNITY): Payer: Medicare HMO

## 2017-04-22 ENCOUNTER — Encounter (HOSPITAL_COMMUNITY): Payer: Medicare HMO

## 2017-04-22 ENCOUNTER — Ambulatory Visit: Payer: Medicare HMO | Admitting: Vascular Surgery

## 2017-04-22 DIAGNOSIS — E039 Hypothyroidism, unspecified: Secondary | ICD-10-CM

## 2017-04-22 LAB — BASIC METABOLIC PANEL
Anion gap: 7 (ref 5–15)
BUN: 21 mg/dL — AB (ref 6–20)
CHLORIDE: 100 mmol/L — AB (ref 101–111)
CO2: 28 mmol/L (ref 22–32)
Calcium: 8.3 mg/dL — ABNORMAL LOW (ref 8.9–10.3)
Creatinine, Ser: 1.12 mg/dL (ref 0.61–1.24)
GFR calc Af Amer: >60 mL/min (ref >60)
GFR calc non Af Amer: >60 mL/min (ref >60)
Glucose, Bld: 76 mg/dL (ref 65–99)
POTASSIUM: 4 mmol/L (ref 3.5–5.1)
SODIUM: 135 mmol/L (ref 135–145)

## 2017-04-22 MED ORDER — ATORVASTATIN CALCIUM 40 MG PO TABS: 40.0000 mg | ORAL_TABLET | Freq: Every day | ORAL | Status: DC

## 2017-04-22 MED ORDER — OXYCODONE HCL 5 MG PO TABS: 5.0000 mg | ORAL_TABLET | ORAL | 0 refills | Status: AC | PRN

## 2017-04-22 MED ORDER — POTASSIUM CHLORIDE CRYS ER 20 MEQ PO TBCR: 20.0000 meq | EXTENDED_RELEASE_TABLET | Freq: Two times a day (BID) | ORAL | Status: AC

## 2017-04-22 MED ORDER — FUROSEMIDE 40 MG PO TABS: 40.0000 mg | ORAL_TABLET | Freq: Two times a day (BID) | ORAL | Status: AC

## 2017-04-22 MED ORDER — FUROSEMIDE 40 MG PO TABS: 40.0000 mg | ORAL_TABLET | Freq: Two times a day (BID) | ORAL | Status: DC

## 2017-04-22 NOTE — Discharge Instructions (Signed)
Follow with Patient, No Pcp Per in 5-7 days ° °Please get a complete blood count and chemistry panel checked by your Primary MD at your next visit, and again as instructed by your Primary MD. Please get your medications reviewed and adjusted by your Primary MD. ° °Please request your Primary MD to go over all Hospital Tests and Procedure/Radiological results at the follow up, please get all Hospital records sent to your Prim MD by signing hospital release before you go home. ° °If you had Pneumonia of Lung problems at the Hospital: °Please get a 2 view Chest X ray done in 6-8 weeks after hospital discharge or sooner if instructed by your Primary MD. ° °If you have Congestive Heart Failure: °Please call your Cardiologist or Primary MD anytime you have any of the following symptoms:  °1) 3 pound weight gain in 24 hours or 5 pounds in 1 week  °2) shortness of breath, with or without a dry hacking cough  °3) swelling in the hands, feet or stomach  °4) if you have to sleep on extra pillows at night in order to breathe ° °Follow cardiac low salt diet and 1.5 lit/day fluid restriction. ° °If you have diabetes °Accuchecks 4 times/day, Once in AM empty stomach and then before each meal. °Log in all results and show them to your primary doctor at your next visit. °If any glucose reading is under 80 or above 300 call your primary MD immediately. ° °If you have Seizure/Convulsions/Epilepsy: °Please do not drive, operate heavy machinery, participate in activities at heights or participate in high speed sports until you have seen by Primary MD or a Neurologist and advised to do so again. ° °If you had Gastrointestinal Bleeding: °Please ask your Primary MD to check a complete blood count within one week of discharge or at your next visit. Your endoscopic/colonoscopic biopsies that are pending at the time of discharge, will also need to followed by your Primary MD. ° °Get Medicines reviewed and adjusted. °Please take all your  medications with you for your next visit with your Primary MD ° °Please request your Primary MD to go over all hospital tests and procedure/radiological results at the follow up, please ask your Primary MD to get all Hospital records sent to his/her office. ° °If you experience worsening of your admission symptoms, develop shortness of breath, life threatening emergency, suicidal or homicidal thoughts you must seek medical attention immediately by calling 911 or calling your MD immediately  if symptoms less severe. ° °You must read complete instructions/literature along with all the possible adverse reactions/side effects for all the Medicines you take and that have been prescribed to you. Take any new Medicines after you have completely understood and accpet all the possible adverse reactions/side effects.  ° °Do not drive or operate heavy machinery when taking Pain medications.  ° °Do not take more than prescribed Pain, Sleep and Anxiety Medications ° °Special Instructions: If you have smoked or chewed Tobacco  in the last 2 yrs please stop smoking, stop any regular Alcohol  and or any Recreational drug use. ° °Wear Seat belts while driving. ° °Please note °You were cared for by a hospitalist during your hospital stay. If you have any questions about your discharge medications or the care you received while you were in the hospital after you are discharged, you can call the unit and asked to speak with the hospitalist on call if the hospitalist that took care of you is not available.   Once you are discharged, your primary care physician will handle any further medical issues. Please note that NO REFILLS for any discharge medications will be authorized once you are discharged, as it is imperative that you return to your primary care physician (or establish a relationship with a primary care physician if you do not have one) for your aftercare needs so that they can reassess your need for medications and monitor your  lab values.  You can reach the hospitalist office at phone [phone] or fax [phone]   If you do not have a primary care physician, you can call [phone] for a physician referral.  Activity: As tolerated with Full fall precautions use walker/cane & assistance as needed  Diet: heart healthy  Disposition SNF

## 2017-04-22 NOTE — Discharge Summary (Signed)
Physician Discharge Summary  [name] ZOX:[id number] DOB: April 08, 1941 DOA: 04/20/2017  PCP: Patient, No Pcp Per  Admit date: 04/20/2017 Discharge date: 04/22/2017  Admitted From: SNF Disposition:  SNF  Recommendations for Outpatient Follow-up:  1. Follow up with PCP in 1-2 weeks 2. Lasix and K increased to twice daily 3. Please repeat a BMP in 3-4 days 4. Monitor weights   Home Health: none Equipment/Devices: none  Discharge Condition: stable CODE STATUS: Full code Diet recommendation: heart healthy, low salt, fluid restricted   HPI: Caleb Hays is a 76 y.o. male with medical history significant of coronary artery disease, COPD, tobacco abuse in remission, hypothyroidism, peripheral arterial disease status post bilateral amputations, chronic systolic and diastolic CHF, presents to the emergency room with chief complaint of shortness of breath.  Patient has been hospitalized twice in September 2018 with shortness of breath found to have a right-sided pleural effusion which she diuresis and thoracentesis.  He felt better after each time, was discharged to SNF.  He states that after he left the hospital on 04/06/2017, he felt well, however over the last several days has been having progressive shortness of breath at rest.  Patient has a right BKA left ray amputation and he is minimally ambulatory.  He denies any chest pain.  Denies any abdominal pain, nausea or vomiting.  He has had no fever or chills.  He was evaluated by cardiology last time due to significant coronary artery disease, had a cardiac catheterization done on 9/17 with multiple vessel disease and he was evaluated by cardiothoracic surgery who did not think patient is a CABG candidate.  He underwent a repeat cath next day and underwent rotational atherectomy on his left circumflex/marginal vessel in 2 different sites.  He felt well, his breathing was back to baseline after diuresis and thoracentesis on 9/14 and was  discharged to SNF.  ED Course: In the ED is slight increase of his oxygen requirement to 3 L from a baseline of 2 L but otherwise his vitals are stable, his chest x-ray showed moderate right pleural effusion, on blood work is significant for a BNP which is elevated close to 2000.  He was given IV Lasix and we were asked to admit  Hospital Course: Discharge Diagnoses:  Active Problems:   COPD (chronic obstructive pulmonary disease) (HCC)   Malnutrition of moderate degree   Anemia of chronic disease   Adult failure to thrive   Below knee amputation status (HCC)   Hypothyroidism   CKD (chronic kidney disease) stage 3, GFR 30-59 ml/min (HCC)   PAD (peripheral artery disease) (HCC)   Recurrent right pleural effusion   Chronic respiratory failure with hypoxia (HCC)   Acute on chronic combined systolic and diastolic CHF (congestive heart failure) (HCC)   Coronary artery disease involving native coronary artery of native heart with unstable angina pectoris (HCC)   Acute on chronic combined systolic and diastolic CHF with recurrent right pleural effusion -Patient was admitted to the hospital with acute on chronic CHF, and he was started on 40 mg of IV Lasix BID. He diuresed well and is net negative 2.6L and his weight improved from 154 on admission to 145. His pleural effusion is stable, has had thoracenteses in the past however would like to avoid repeated procedures which can increase risk of complications. Given CHF as the cause, better to treat medically with increased diuretics dose as below. His respiratory status is stable and at baseline. Continue beta-blocker, continue ACE inhibitors.  No need for repeat a 2D echo at this point as it was done last month and showed an EF of 30-35% Coronary artery disease -Patient with significant CAD, not a candidate for CABG, status post rotational atherectomy on his left circumflex/marginal vessel. No chest pain COPD with chronic hypoxic respiratory failure  -Continue chronic oxygen, no wheezing, does not appear to be an exacerbation Chronic kidney disease stage III -Creatinine appears at baseline, is remaining stable with diuresis. Recheck in 3-4 days Hypothyroidism -Continue Synthroid Adult failure to thrive/severe malnutrition -Dietary consult Anemia of chronic disease -Hemoglobin appears at baseline, no bleeding Peripheral vascular disease -Status post bilateral amputations   Discharge Instructions   Allergies as of 04/22/2017   No Known Allergies     Medication List    TAKE these medications   albuterol 108 (90 Base) MCG/ACT inhaler Commonly known as:  PROVENTIL HFA;VENTOLIN HFA Inhale 2 puffs into the lungs 2 (two) times daily. 0900 & 1700   allopurinol 100 MG tablet Commonly known as:  ZYLOPRIM Take 100 mg by mouth daily.   aspirin 81 MG chewable tablet Chew 81 mg by mouth daily.   atorvastatin 40 MG tablet Commonly known as:  LIPITOR Take 1 tablet (40 mg total) by mouth daily at 6 PM. What changed:  medication strength  how much to take   carvedilol 3.125 MG tablet Commonly known as:  COREG Take 1 tablet (3.125 mg total) by mouth 2 (two) times daily.   cholecalciferol 1000 units tablet Commonly known as:  VITAMIN D Take 1,000 Units by mouth daily. (0900)   clopidogrel 75 MG tablet Commonly known as:  PLAVIX Take 1 tablet (75 mg total) by mouth daily with breakfast.   ferrous sulfate 325 (65 FE) MG tablet Take 1 tablet (325 mg total) by mouth 2 (two) times daily with a meal.   furosemide 40 MG tablet Commonly known as:  LASIX Take 1 tablet (40 mg total) by mouth 2 (two) times daily. What changed:  when to take this   gabapentin 100 MG capsule Commonly known as:  NEURONTIN Take 200 mg by mouth at bedtime.   guaiFENesin 600 MG 12 hr tablet Commonly known as:  MUCINEX Take 1,200 mg by mouth 2 (two) times daily as needed for cough.   ipratropium-albuterol 0.5-2.5 (3) MG/3ML Soln Commonly known as:   DUONEB Take 3 mLs by nebulization 4 (four) times daily as needed (for shortness of breath).   levothyroxine 25 MCG tablet Commonly known as:  SYNTHROID, LEVOTHROID Take 1 tablet (25 mcg total) by mouth daily at 6 (six) AM.   lisinopril 5 MG tablet Commonly known as:  PRINIVIL,ZESTRIL Take 1 tablet (5 mg total) by mouth daily.   meloxicam 7.5 MG tablet Commonly known as:  MOBIC Take 7.5 mg by mouth daily.   methocarbamol 500 MG tablet Commonly known as:  ROBAXIN Take 500 mg by mouth every 6 (six) hours as needed for muscle spasms.   multivitamin with minerals tablet Take 1 tablet by mouth daily. (0900)   omeprazole 20 MG capsule Commonly known as:  PRILOSEC Take 20 mg by mouth daily at 6 (six) AM.   oxyCODONE 5 MG immediate release tablet Commonly known as:  Oxy IR/ROXICODONE Take 1-2 tablets (5-10 mg total) by mouth every 4 (four) hours as needed (  for moderate pain,  for severe pain). What changed:  how much to take  reasons to take this   potassium chloride SA 20 MEQ tablet Commonly known as:  K-DUR,KLOR-CON  Take 1 tablet (20 mEq total) by mouth 2 (two) times daily. What changed:  when to take this   saccharomyces boulardii 250 MG capsule Commonly known as:  FLORASTOR Take 250 mg by mouth 2 (two) times daily.   traMADol 50 MG tablet Commonly known as:  ULTRAM Take 1 tablet (50 mg total) by mouth every 6 (six) hours as needed for moderate pain.   traZODone 50 MG tablet Commonly known as:  DESYREL Take 50 mg by mouth at bedtime.      Contact information for after-discharge care    Destination    [facility] Follow up.   Specialty:  Skilled Nursing Facility Contact information: [address] [place] [address] [phone]              Consultations:  Cardiology   Procedures/Studies:  Dg Chest 1 View  Result Date: 03/31/2017 CLINICAL DATA:  Status post right thoracentesis. EXAM: CHEST 1 VIEW  COMPARISON:  03/30/2017 FINDINGS: Since prior exam, there has been a significant decrease in right pleural fluid following thoracentesis. Residual right lung base opacity is likely combination of atelectasis and minimal residual fluid. There is no pneumothorax or evidence of a procedure complication. No other change. IMPRESSION: 1. No pneumothorax or evidence of a complication following thoracentesis. 2. Significant reduction in the right pleural effusion following thoracentesis. Electronically Signed   By: Amie Portland M.D.   On: 03/31/2017 17:48   Dg Chest 2 View  Result Date: 04/22/2017 CLINICAL DATA:  76 year old male with a history of pleural effusion EXAM: CHEST  2 VIEW COMPARISON:  Multiple prior, most recent 04/20/2017 FINDINGS: Cardiomediastinal silhouette unchanged in size and contour. Calcifications of the aortic arch. No evidence of central vascular congestion or interlobular septal thickening. No pneumothorax. Opacity at the right lung base with pleuroparenchymal thickening and thickening of the minor fissure. Meniscus on the lateral view at the sulcus. Left lung relatively clear. IMPRESSION: Similar appearance to the prior chest x-ray with right-sided pleural effusion and associated atelectasis/ consolidation. Electronically Signed   By: Gilmer Mor D.O.   On: 04/22/2017 10:16   Dg Chest 2 View  Result Date: 04/20/2017 CLINICAL DATA:  Recurrent pleural effusion. History pneumonia. No chest pain. EXAM: CHEST  2 VIEW COMPARISON:  03/31/2017 FINDINGS: Moderate right pleural effusion. No left pleural effusion. No focal consolidation. No pneumothorax. Stable cardiomediastinal silhouette. No acute osseous abnormality. IMPRESSION: Moderate right pleural effusion. Electronically Signed   By: Elige Ko   On: 04/20/2017 16:26   Dg Chest 2 View  Result Date: 03/30/2017 CLINICAL DATA:  Shortness of breath. EXAM: CHEST  2 VIEW COMPARISON:  March 19, 2017. FINDINGS: Stable cardiomegaly. Normal  pulmonary vascularity. New moderate loculated right pleural effusion. Trace left pleural effusion. Bibasilar atelectasis. No pneumothorax. No acute osseous abnormality. IMPRESSION: Interval reaccumulation of moderate loculated right pleural effusion. Electronically Signed   By: Obie Dredge M.D.   On: 03/30/2017 15:54   US Thoracentesis Asp Pleural Space W/img Guide  Result Date: 04/01/2017 INDICATION: COPD, dyspnea, recurrent right pleural effusion. Request made for diagnostic and therapeutic right thoracentesis. EXAM: ULTRASOUND GUIDED DIAGNOSTIC AND THERAPEUTIC RIGHT THORACENTESIS MEDICATIONS: None. COMPLICATIONS: None immediate. PROCEDURE: An ultrasound guided thoracentesis was thoroughly discussed with the patient and questions answered. The benefits, risks, alternatives and complications were also discussed. The patient understands and wishes to proceed with the procedure. Written consent was obtained. Ultrasound was performed to localize and mark an adequate pocket of fluid in the right chest. The area  was then prepped and draped in the normal sterile fashion. 1% Lidocaine was used for local anesthesia. Under ultrasound guidance a Safe-T-Centesis catheter was introduced. Thoracentesis was performed. The catheter was removed and a dressing applied. FINDINGS: A total of approximately 2.2 liters of yellow fluid was removed. Samples were sent to the laboratory as requested by the clinical team. IMPRESSION: Successful ultrasound guided diagnostic and therapeutic right thoracentesis yielding 2.2 liters of pleural fluid. Read by: Jeananne Rama, PA-C Electronically Signed   By: Jolaine Click M.D.   On: 03/31/2017 17:28     Subjective: - no chest pain, shortness of breath, no abdominal pain, nausea or vomiting.   Discharge Exam: Vitals:   04/22/17 0633 04/22/17 1230  BP: 138/60 130/67  Pulse: (!) 55 (!) 54  Resp: 18 18  Temp: 98.7 F (37.1 C) 97.7 F (36.5 C)  SpO2: 92% 91%    General: Pt is  alert, awake, not in acute distress Cardiovascular: RRR, S1/S2 +, no rubs, no gallops Respiratory: CTA bilaterally, no wheezing, no rhonchi Abdominal: Soft, NT, ND, bowel sounds + Extremities: no edema, no cyanosis    The results of significant diagnostics from this hospitalization (including imaging, microbiology, ancillary and laboratory) are listed below for reference.     Microbiology: No results found for this or any previous visit (from the past 240 hour(s)).   Labs: BNP (last 3 results)  Recent Labs  03/17/17 1725 03/30/17 1622 04/20/17 1536  BNP 1,406.0* 1,074.4* 1,919.0*   Basic Metabolic Panel:  Recent Labs Lab 04/20/17 1556 04/21/17 0425 04/22/17 0447  NA 140 141 135  K 4.4 3.6 4.0  CL 103 102 100*  CO2 GLUCOSE 119* 75 76  BUN 20 21* 21*  CREATININE 1.17 1.09 1.12  CALCIUM 8.4* 8.2* 8.3*   Liver Function Tests: No results for input(s): AST, ALT, ALKPHOS, BILITOT, PROT, ALBUMIN in the last 168 hours. No results for input(s): LIPASE, AMYLASE in the last 168 hours. No results for input(s): AMMONIA in the last 168 hours. CBC:  Recent Labs Lab 04/20/17 1556  WBC 9.1  HGB 9.5*  HCT 31.8*  MCV 71.6*  PLT 467*   Cardiac Enzymes: No results for input(s): CKTOTAL, CKMB, CKMBINDEX, TROPONINI in the last 168 hours. BNP: Invalid input(s): POCBNP CBG: No results for input(s): GLUCAP in the last 168 hours. D-Dimer No results for input(s): DDIMER in the last 72 hours. Hgb A1c No results for input(s): HGBA1C in the last 72 hours. Lipid Profile No results for input(s): CHOL, HDL, LDLCALC, TRIG, CHOLHDL, LDLDIRECT in the last 72 hours. Thyroid function studies No results for input(s): TSH, T4TOTAL, T3FREE, THYROIDAB in the last 72 hours.  Invalid input(s): FREET3 Anemia work up No results for input(s): VITAMINB12, FOLATE, FERRITIN, TIBC, IRON, RETICCTPCT in the last 72 hours. Urinalysis    Component Value Date/Time   COLORURINE YELLOW  07/22/2016 1631   APPEARANCEUR CLOUDY (A) 07/22/2016 1631   LABSPEC 1.025 07/22/2016 1631   PHURINE 5.5 07/22/2016 1631   GLUCOSEU NEGATIVE 07/22/2016 1631   HGBUR MODERATE (A) 07/22/2016 1631   BILIRUBINUR NEGATIVE 07/22/2016 1631   KETONESUR NEGATIVE 07/22/2016 1631   PROTEINUR 30 (A) 07/22/2016 1631   UROBILINOGEN >8.0 (H) 05/14/2012 1805   NITRITE NEGATIVE 07/22/2016 1631   LEUKOCYTESUR LARGE (A) 07/22/2016 1631   Sepsis Labs Invalid input(s): PROCALCITONIN,  WBC,  LACTICIDVEN   Time coordinating discharge: 35 minutes  SIGNED:  Pamella Pert, MD  [facility] 04/22/2017, 1:32 PM Pager [phone]  If 7PM-7AM, please contact night-coverage [email or web address] Password [email or web address]

## 2017-04-22 NOTE — Clinical Social Work Note (Signed)
CSW facilitated patient discharge including contacting facility to confirm patient discharge plans. Patient declined to have CSW call family. Clinical information faxed to facility and family agreeable with plan. CSW arranged ambulance transport via [facility] to [facility]. RN to call report prior to discharge [phone]).  CSW will sign off for now as social work intervention is no longer needed. Please consult [place] again if new needs arise.  Charlynn Court, CSW [phone]

## 2017-04-22 NOTE — NC FL2 (Signed)
[place] MEDICAID FL2 LEVEL OF CARE SCREENING TOOL     IDENTIFICATION  Patient Name: Caleb Hays Birthdate: [birth year] Sex: male Admission Date (Current Location): 04/20/2017  [facility] and [place] Number:  Producer, television/film/video and Address:  [facility], [address], [place], [place] [address]      Provider Number: [id number]  Attending Physician Name and Address:  Leatha Gilding, MD  Relative Name and Phone Number:       Current Level of Care: Hospital Recommended Level of Care: Skilled Nursing Facility Prior Approval Number:    Date Approved/Denied:   PASRR Number: [id number]  Discharge Plan: SNF    Current Diagnoses: Patient Active Problem List   Diagnosis Date Noted  . CHF (congestive heart failure) (HCC) 04/20/2017  . Coronary artery disease involving native coronary artery of native heart with unstable angina pectoris (HCC)   . Acute on chronic combined systolic and diastolic CHF (congestive heart failure) (HCC)   . Dilated cardiomyopathy (HCC)   . Elevated troponin 03/30/2017  . Chronic respiratory failure with hypoxia (HCC) 03/30/2017  . HCAP (healthcare-associated pneumonia) 03/17/2017  . Recurrent right pleural effusion 03/17/2017  . Chronic pain 03/17/2017  . S/P transmetatarsal amputation of foot, left (HCC) 10/08/2016  . PAD (peripheral artery disease) (HCC) 09/15/2016  . Edema 05/05/2016  . Phantom pain (HCC) 03/08/2016  . Gout 03/08/2016  . Hypothyroidism 03/08/2016  . CKD (chronic kidney disease) stage 3, GFR 30-59 ml/min (HCC) 03/08/2016  . GERD without esophagitis 03/08/2016  . Constipation 12/29/2015  . Below knee amputation status (HCC) 10/01/2015  . Ankle pain 08/13/2015  . Adult failure to thrive   . PVD (peripheral vascular disease) (HCC)   . Elevated blood uric acid level 08/10/2015  . 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 Anemia of chronic disease 08/10/2015  . Malnutrition of moderate degree  06/24/2015  . Failure to thrive in adult 06/23/2015  . Weight loss 06/23/2015  . Tobacco abuse 06/23/2015  . COPD (chronic obstructive pulmonary disease) (HCC) 06/23/2015  . Fall   . Critical lower limb ischemia 05/31/2014  . Status post right foot surgery 03/12/2014    Orientation RESPIRATION BLADDER Height & Weight     Self, Time, Situation, Place  Normal Continent Weight: 145 lb (65.8 kg) Height:   (193 cm)  BEHAVIORAL SYMPTOMS/MOOD NEUROLOGICAL BOWEL NUTRITION STATUS   (None)   Continent Diet (2 gram sodium)  AMBULATORY STATUS COMMUNICATION OF NEEDS Skin     Verbally Skin abrasions, Bruising, Other (Comment) (Amputation.)                       Personal Care Assistance Level of Assistance              Functional Limitations Info  Sight, Hearing, Speech Sight Info: Adequate Hearing Info: Adequate Speech Info: Adequate    SPECIAL CARE FACTORS FREQUENCY                       Contractures Contractures Info: Not present    Additional Factors Info  Code Status, Allergies, Isolation Precautions Code Status Info: Full Allergies Info: NKDA     Isolation Precautions Info: Contact: MRSA     Current Medications (04/22/2017):  This is the current hospital active medication list Current Facility-Administered Medications  Medication Dose Route Frequency Provider Last Rate Last Dose  . 0.9 %  sodium chloride infusion  250 mL Intravenous PRN Gherghe, Costin  M, MD      . acetaminophen (TYLENOL) tablet 650 mg  650 mg Oral Q4H PRN Leatha Gilding, MD      . allopurinol (ZYLOPRIM) tablet 100 mg  100 mg Oral Daily Leatha Gilding, MD   100 mg at 04/22/17 0907  . aspirin chewable tablet 81 mg  81 mg Oral Daily Leatha Gilding, MD   81 mg at 04/22/17 0907  . atorvastatin (LIPITOR) tablet 40 mg  40 mg Oral q1800 Dunn, Dayna N, PA-C      . carvedilol (COREG) tablet 3.125 mg  3.125 mg Oral BID Leatha Gilding, MD   3.125 mg at 04/22/17 0907  . cholecalciferol  (VITAMIN D) tablet 1,000 Units  1,000 Units Oral Daily Leatha Gilding, MD   1,000 Units at 04/22/17 0907  . clopidogrel (PLAVIX) tablet 75 mg  75 mg Oral Q breakfast Leatha Gilding, MD   75 mg at 04/22/17 0906  . enoxaparin (LOVENOX) injection 40 mg  40 mg Subcutaneous Daily Leatha Gilding, MD   40 mg at 04/22/17 0906  . ferrous sulfate tablet 325 mg  325 mg Oral BID WC Leatha Gilding, MD   325 mg at 04/22/17 1610  . furosemide (LASIX) tablet 40 mg  40 mg Oral BID Chrystie Nose, MD      . gabapentin (NEURONTIN) capsule 200 mg  200 mg Oral QHS Leatha Gilding, MD   200 mg at 04/22/17 0014  . guaiFENesin (MUCINEX) 12 hr tablet 1,200 mg  1,200 mg Oral BID PRN Leatha Gilding, MD      . ipratropium-albuterol (DUONEB) 0.5-2.5 (3) MG/3ML nebulizer solution 3 mL  3 mL Nebulization QID PRN Leatha Gilding, MD   3 mL at 04/21/17 1427  . levothyroxine (SYNTHROID, LEVOTHROID) tablet 25 mcg  25 mcg Oral Q0600 Leatha Gilding, MD   25 mcg at 04/22/17 [phone]  . lisinopril (PRINIVIL,ZESTRIL) tablet 5 mg  5 mg Oral Daily Leatha Gilding, MD   5 mg at 04/22/17 0907  . methocarbamol (ROBAXIN) tablet 500 mg  500 mg Oral Q6H PRN Leatha Gilding, MD      . ondansetron (ZOFRAN) injection 4 mg  4 mg Intravenous Q6H PRN Leatha Gilding, MD      . oxyCODONE (Oxy IR/ROXICODONE) immediate release tablet 5-10 mg  5-10 mg Oral Q4H PRN Leatha Gilding, MD      . pantoprazole (PROTONIX) EC tablet 40 mg  40 mg Oral Daily Leatha Gilding, MD   40 mg at 04/22/17 0907  . potassium chloride SA (K-DUR,KLOR-CON) CR tablet 20 mEq  20 mEq Oral BID Marcelino Duster, [place]   20 mEq at 04/22/17 5409  . saccharomyces boulardii (FLORASTOR) capsule 250 mg  250 mg Oral BID Leatha Gilding, MD   250 mg at 04/22/17 0907  . sodium chloride flush (NS) 0.9 % injection 3 mL  3 mL Intravenous Q12H Leatha Gilding, MD   3 mL at 04/22/17 0907  . sodium chloride flush (NS) 0.9 % injection 3 mL  3 mL Intravenous PRN  Leatha Gilding, MD      . traMADol Janean Sark) tablet 50 mg  50 mg Oral Q6H PRN Leatha Gilding, MD      . traZODone (DESYREL) tablet 50 mg  50 mg Oral QHS Leatha Gilding, MD   50 mg at 04/22/17 0015     Discharge Medications: Please see discharge summary for  a list of discharge medications.  Relevant Imaging Results:  Relevant Lab Results:   Additional Information SS#: [id number]  Margarito Liner, LCSW

## 2017-04-22 NOTE — Clinical Social Work Note (Signed)
Clinical Social Work Assessment  Patient Details  Name: Caleb Hays MRN: [id number] Date of Birth: 19-Jul-1941  Date of referral:  04/22/17               Reason for consult:  Discharge Planning                Permission sought to share information with:  Chartered certified accountant granted to share information::  Yes, Verbal Permission Granted  Name::        Agency::  [facility]  Relationship::     Contact Information:     Housing/Transportation Living arrangements for the past 2 months:  [place] of Information:  Patient, Medical Team Patient Interpreter Needed:  None Criminal Activity/Legal Involvement Pertinent to Current Situation/Hospitalization:  No - Comment as needed Significant Relationships:  Other Family Members Lives with:  Facility Resident Do you feel safe going back to the place where you live?  Yes Need for family participation in patient care:  Yes (Comment)  Care giving concerns:  Patient is a long-term resident at [facility].   Social Worker assessment / plan:  CSW met with patient. No supports at bedside. CSW is familiar with patient from last admission. Patient is a long-term resident at [facility] and plans to return. He states MD told him he could discharge today. CSW will follow for orders and a summary. No further concerns. CSW encouraged patient to contact CSW as needed. CSW will continue to follow patient for support and facilitate discharge back to SNF once medically stable.  Employment status:  Retired Nurse, adult, Medicaid In [place] PT Recommendations:  Not assessed at this time [place] / Referral to community resources:  [place]  Patient/Family's Response to care:  Patient agreeable to return to SNF. Patient's niece supportive and involved in patient's care. Patient appreciated social work intervention.  Patient/Family's Understanding of and  Emotional Response to Diagnosis, Current Treatment, and Prognosis:  Patient has a good understanding of the reason for admission and his need to return to SNF. Patient appears happy with hospital care.  Emotional Assessment Appearance:  Appears stated age Attitude/Demeanor/Rapport:  Other (Pleasant) Affect (typically observed):  Accepting, Appropriate, Calm, Pleasant Orientation:  Oriented to Self, Oriented to Place, Oriented to  Time, Oriented to Situation Alcohol / Substance use:  Tobacco Use Psych involvement (Current and /or in the community):  No (Comment)  Discharge Needs  Concerns to be addressed:  Care Coordination Readmission within the last 30 days:  Yes Current discharge risk:  Dependent with Mobility Barriers to Discharge:  Continued Medical Work up   Candie Chroman, LCSW 04/22/2017, 12:33 PM

## 2017-04-22 NOTE — Progress Notes (Signed)
Progress Note  Patient Name: Caleb Hays Date of Encounter: 04/22/2017  Primary Cardiologist: Dr. Allyson Sabal  Subjective   Breathing improved. Doesn't quite feel back to baseline but has a hard time relaying why that is. No CP.  Inpatient Medications    Scheduled Meds: . allopurinol  100 mg Oral Daily  . aspirin  81 mg Oral Daily  . atorvastatin  20 mg Oral q1800  . carvedilol  3.125 mg Oral BID  . cholecalciferol  1,000 Units Oral Daily  . clopidogrel  75 mg Oral Q breakfast  . enoxaparin (LOVENOX) injection  40 mg Subcutaneous Daily  . ferrous sulfate  325 mg Oral BID WC  . furosemide  40 mg Intravenous Q12H  . gabapentin  200 mg Oral QHS  . levothyroxine  25 mcg Oral Q0600  . lisinopril  5 mg Oral Daily  . pantoprazole  40 mg Oral Daily  . potassium chloride  20 mEq Oral BID  . saccharomyces boulardii  250 mg Oral BID  . sodium chloride flush  3 mL Intravenous Q12H  . traZODone  50 mg Oral QHS   Continuous Infusions: . sodium chloride     PRN Meds: sodium chloride, acetaminophen, guaiFENesin, ipratropium-albuterol, methocarbamol, ondansetron (ZOFRAN) IV, oxyCODONE, sodium chloride flush, traMADol   Vital Signs    Vitals:   04/21/17 1701 04/21/17 2020 04/22/17 0001 04/22/17 0633  BP: (!) 155/61 139/69 131/69 138/60  Pulse: 87 (!) 48 (!) 53 (!) 55  Resp:  Temp:  97.9 F (36.6 C) 98.4 F (36.9 C) 98.7 F (37.1 C)  TempSrc:  Oral Oral Oral  SpO2:  96% 98% 92%  Weight:    145 lb (65.8 kg)  Height:        Intake/Output Summary (Last 24 hours) at 04/22/17 0954 Last data filed at 04/22/17 0004  Gross per 24 hour  Intake              720 ml  Output             1075 ml  Net             -355 ml   Filed Weights   04/20/17 1529 04/21/17 0922 04/22/17 0633  Weight: 154 lb (69.9 kg) 147 lb 11.2 oz (67 kg) 145 lb (65.8 kg)    Telemetry    NSR occ PVCs or junctional beat - Personally Reviewed  Physical Exam   GEN: No acute distress. Chronically  ill/debilitated appearing HEENT: Normocephalic, atraumatic, sclera non-icteric. Neck: No JVD or bruits. Cardiac: RRR occasional ectopy no murmurs, rubs, or gallops.  Radials/DP/PT 1+ and equal bilaterally.  Respiratory: Poor inspiratory effort, decreased BS R base, otherwise clear. Breathing is unlabored. GI: Soft, nontender, non-distended, BS +x 4. MS: no deformity. Extremities: No clubbing or cyanosis. No edema. s/p right BKA and left TMA Neuro:  AAOx3. Follows commands. Psych:  Responds to questions appropriately with a normal affect.  Labs    Chemistry Recent Labs Lab 04/20/17 1556 04/21/17 0425 04/22/17 0447  NA 140 141 135  K 4.4 3.6 4.0  CL 103 102 100*  CO2 GLUCOSE 119* 75 76  BUN 20 21* 21*  CREATININE 1.17 1.09 1.12  CALCIUM 8.4* 8.2* 8.3*  GFRNONAA 59* >60 >60  GFRAA >60 >60 >60  ANIONGAP Hematology Recent Labs Lab 04/20/17 1556  WBC 9.1  RBC 4.44  HGB 9.5*  HCT 31.8*  MCV 71.6*  MCH 21.4*  MCHC 29.9*  RDW 19.1*  PLT 467*    Cardiac EnzymesNo results for input(s): TROPONINI in the last 168 hours.  Recent Labs Lab 04/20/17 1608  TROPIPOC 0.01     BNP Recent Labs Lab 04/20/17 1536  BNP 1,919.0*    Radiology    Dg Chest 2 View  Result Date: 04/20/2017 CLINICAL DATA:  Recurrent pleural effusion. History pneumonia. No chest pain. EXAM: CHEST  2 VIEW COMPARISON:  03/31/2017 FINDINGS: Moderate right pleural effusion. No left pleural effusion. No focal consolidation. No pneumothorax. Stable cardiomediastinal silhouette. No acute osseous abnormality. IMPRESSION: Moderate right pleural effusion. Electronically Signed   By: Elige Ko   On: 04/20/2017 16:26    Cardiac Studies   2D Echo 03/31/17 Study Conclusions  - Left ventricle: The cavity size was normal. Wall thickness was   normal. Systolic function was moderately to severely reduced. The   estimated ejection fraction was in the range of 30% to 35%.   Features  are consistent with a pseudonormal left ventricular   filling pattern, with concomitant abnormal relaxation and   increased filling pressure (grade 2 diastolic dysfunction). - Aortic valve: There was very mild stenosis. Valve area (VTI):   1.15 cm^2. Valve area (Vmax): 1.08 cm^2. Valve area (Vmean): 1.04   cm^2. - Mitral valve: There was moderate regurgitation. - Left atrium: The atrium was severely dilated. - Right ventricle: Systolic function was mildly reduced. - Right atrium: The atrium was moderately to severely dilated. - Tricuspid valve: There was mild-moderate regurgitation. - Pulmonary arteries: Systolic pressure was moderately increased.   PA peak pressure: 57 mm Hg (S).  Patient Profile     76 y.o. male with COPD with chronic respiratory failure on 2L home O2, PVD s/p right BKA, CAD s/p recent stents placed to OM1 (04/05/17), prior alcohol abuse, chronic combined CHF, CKD stage II-II, depression, physical deconditioning, wheelchair bound who was recently admitted 03/2017 with dyspnea and required thoracentesis; fluid no growth and culture was negative and pathology showed reactive mesothelial cells and "mixed lymphoid population." 2D echo showed newly low EF 30-35%, graed 2 DD, mild AS, moderate MR, mild-mod TR, PASP . Cath during that admission showing multivessel disease turned down for CABG and underwent subsequent DESx2 to prox Cx into OM1 on 04/05/17. RHC showed PWCP of 22. Other issues during that admission included elevated TSH prompting increase in levothyroxine. He returned to [facility] 04/20/2017 with recurrent moderate right pleural effusion and increased O2 requirement.   Assessment & Plan    1. Acute on chronic combined heart failure/pulmonary HTN with recurrent right pleural effusion - has diuresed from 154->145lb today. Already received IV Lasix this AM - I believe we can likely change to oral form today. Will review with MD as he may benefit from a combination of Lasix and  addition of spironolactone given his LV dysfunction.   2. CAD s/p DES x2 to OM1 (04/05/17) - continue ASA, Plavix, beta blocker. Will titrate atorvastatin to  qpm - this was just started at lower dose last admission.   3. Valvular disease with mild AS, moderate MR, mild-moderate TR - continue to follow medically. With regard to his mitral regurg, I am not sure he would be a candidate for any further evaluation of this given that he was turned down for CABG.  3. Irregular rhythm - repeat EKG confirms NSR with frequent ectopy (P waves difficult to see in some leads but more prominent in others). Brief idioventricular  rhythm yesterday has resolved.  4. Chronic-appearing anemia - baseline Hgb appears frequently in the 9 range and chronic. Per IM.  5. CKD stage II-III - Cr appears stable.  Signed, Ronie Spies PA-C (pager [phone]) 04/22/2017, 9:54 AM

## 2017-04-22 NOTE — Progress Notes (Signed)
Patient is from [facility]; Soc Worker following for DCP/ return back to the facility; [employer] [phone]

## 2017-04-22 NOTE — Progress Notes (Signed)
Report called and given to Elane at [facility], Myrtie Hawk RN 4:10 PM

## 2017-04-25 NOTE — Progress Notes (Signed)
Cardiology Office Note    Date:  04/26/2017   ID:  Caleb Hays, DOB 1941/04/25, MRN [id number]  PCP:  Patient, No Pcp Per  Cardiologist: Dr. Allyson Sabal    Chief Complaint  Patient presents with  . Hospitalization Follow-up    History of Present Illness:    Caleb Hays is a 76 y.o. male with past medical history of CAD (known multivessel CAD and not felt to be a CABG candidate, s/p DESx2 to LCx and OM1 in 03/2017), chronic combined systolic and diastolic CHF (EF 40-98% by echo in 03/2017), ischemic cardiomyopathy, PVD (s/p R BKA), COPD, HTN, and HLD who presents to the office today for hospital follow-up.   He had been hospitalized from 9/12 - 04/06/2017 for worsening dyspnea and chest pain. Was found to have a right pleural effusion with a thoracentesis performed and 2L of fluid removed. He went for a cardiac catheterization which showed multivessel CAD with 95% Prox Cx stenosis, 60% LM, 95% Prox-RCA, and 40% Ost LAD stenosis with an EF of 25% and moderate MR. CT Surgery was consulted but he was deemed to be a poor surgical candidate, therefore he returned to the cath lab for rotational arthrectomy and stent placement of the LCx and OM1. He had been started on ASA and Plavix along with BB and ACE-I therapy.   He presented back to [facility] on 04/20/2017 for worsening dyspnea and was found to have a BNP of 1919. He was started on IV Lasix and weight declined by 9 lbs to 145 lbs at the time of discharge. He was placed on Lasix  BID and discharged back to [facility].   In talking with the patient today, he reports overall doing well since his recent hospitalization. He reports his weight has been stable at 144-145 lbs when checked at [facility]. He denies any recent dyspnea at rest, orthopnea, PND, or lower extremity edema. He is not active at baseline secondary to his right BKA and usually sits in his wheelchair or recliner throughout the day.  He denies any recurrent chest pain  or palpitations. Has remained on DAPT and denies any evidence of active bleeding. He is using 2L [place] throughout the day with oxygen saturations at 96% when checked in the office today.    Past Medical History:  Diagnosis Date  . Acute on chronic combined systolic (congestive) and diastolic (congestive) heart failure (HCC) 04/2017  . Adult failure to thrive    /notes 06/23/2015  . Anemia   . Arthritis    "hands, fingers" (11/10/2016  . CAD (coronary artery disease)    a. 03/2017: multivessel CAD and not felt to be a CABG candidate, s/p DESx2 to LCx and OM1.   . CKD (chronic kidney disease), stage III (HCC)   . COPD (chronic obstructive pulmonary disease) (HCC)    LONG TIME SMOKER  . Critical lower limb ischemia   . 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 Depression    "periods of depression" (06/23/2015)  . Gout   . Hypothyroidism   . PAD (peripheral artery disease) (HCC)   . Physical deconditioning   . Pyelonephritis 06/23/2015  . Status post foot surgery    right fifth toe amputation by Dr. Lajoyce Corners     Past Surgical History:  Procedure Laterality Date  . ABDOMINAL AORTOGRAM W/LOWER EXTREMITY N/A 09/03/2016   Procedure: Abdominal Aortogram w/Lower Extremity;  Surgeon: Sherren Kerns, MD;  Location: [facility] INVASIVE  CV LAB;  Service: Cardiovascular;  Laterality: N/A;  Lt. leg  . ABDOMINAL AORTOGRAM W/LOWER EXTREMITY N/A 11/10/2016   Procedure: Abdominal Aortogram w/Lower Extremity;  Surgeon: Maeola Harman, MD;  Location: [facility] INVASIVE CV LAB;  Service: Cardiovascular;  Laterality: N/A;  . AMPUTATION Right 03/01/2014   Procedure: AMPUTATION RAY;  Surgeon: Nadara Mustard, MD;  Location: [facility] OR;  Service: Orthopedics;  Laterality: Right;  Right Foot 5th Ray Amputation  . AMPUTATION Right 04/26/2014   Procedure: Right Foot 4th Ray Amputation;  Surgeon: Nadara Mustard, MD;  Location: [facility] OR;  Service: Orthopedics;  Laterality: Right;  . AMPUTATION Right  10/01/2015   Procedure: Right Below Knee Amputation;  Surgeon: Nadara Mustard, MD;  Location: [facility] OR;  Service: Orthopedics;  Laterality: Right;  . AMPUTATION Left 10/08/2016   Procedure: Left Transmetatarsal Amputation;  Surgeon: Nadara Mustard, MD;  Location: [facility] OR;  Service: Orthopedics;  Laterality: Left;  . AMPUTATION Left 07/23/2016   Procedure: Left 2nd Toe Amputation;  Surgeon: Nadara Mustard, MD;  Location: [facility] OR;  Service: Orthopedics;  Laterality: Left;  . ANKLE ARTHROSCOPY Right 08/13/2015   Procedure: ANKLE ARTHROSCOPY;  Surgeon: Nadara Mustard, MD;  Location: [facility] OR;  Service: Orthopedics;  Laterality: Right;  . CATARACT EXTRACTION W/ INTRAOCULAR LENS  IMPLANT, BILATERAL Bilateral   . CORONARY ATHERECTOMY N/A 04/05/2017   Procedure: CORONARY ATHERECTOMY;  Surgeon: Marykay Lex, MD;  Location: [facility] INVASIVE CV LAB;  Service: Cardiovascular;  Laterality: N/A;  . CORONARY STENT INTERVENTION N/A 04/05/2017   Procedure: CORONARY STENT INTERVENTION;  Surgeon: Marykay Lex, MD;  Location: [facility] INVASIVE CV LAB;  Service: Cardiovascular;  Laterality: N/A;  . INGUINAL HERNIA REPAIR Left   . IR THORACENTESIS ASP PLEURAL SPACE W/IMG GUIDE  03/18/2017  . KNEE CARTILAGE SURGERY Left 1960's   football injury  . KNEE LIGAMENT RECONSTRUCTION Left 1960's  . LOWER EXTREMITY ANGIOGRAM N/A 05/27/2014   Procedure: LOWER EXTREMITY ANGIOGRAM;  Surgeon: Runell Gess, MD;  Location: [facility] CATH LAB;  Service: Cardiovascular;  Laterality: N/A;  . LOWER EXTREMITY ANGIOGRAPHY Left 09/15/2016   Procedure: Lower Extremity Angiography;  Surgeon: Maeola Harman, MD;  Location: [facility] INVASIVE CV LAB;  Service: Cardiovascular;  Laterality: Left;  . PERIPHERAL VASCULAR ATHERECTOMY  11/10/2016   Procedure: Peripheral Vascular Atherectomy;  Surgeon: Maeola Harman, MD;  Location: [facility] INVASIVE CV LAB;  Service: Cardiovascular;;  . PERIPHERAL VASCULAR INTERVENTION Left 09/15/2016   Procedure: Peripheral Vascular  Intervention;  Surgeon: Maeola Harman, MD;  Location: [facility] INVASIVE CV LAB;  Service: Cardiovascular;  Laterality: Left;  SFA/popiteal  . PERIPHERAL VASCULAR INTERVENTION  11/10/2016   Hattie Perch 11/10/2016  . PERIPHERAL VASCULAR INTERVENTION  11/10/2016   Procedure: Peripheral Vascular Intervention;  Surgeon: Maeola Harman, MD;  Location: [facility] INVASIVE CV LAB;  Service: Cardiovascular;;  . REFRACTIVE SURGERY Bilateral   . RIGHT/LEFT HEART CATH AND CORONARY ANGIOGRAPHY N/A 04/04/2017   Procedure: RIGHT/LEFT HEART CATH AND CORONARY ANGIOGRAPHY;  Surgeon: Marykay Lex, MD;  Location: [facility] INVASIVE CV LAB;  Service: Cardiovascular;  Laterality: N/A;  . STUMP REVISION Right 12/05/2015   Procedure: Revision Right Below Knee Amputation;  Surgeon: Nadara Mustard, MD;  Location: [facility] OR;  Service: Orthopedics;  Laterality: Right;  . STUMP REVISION Left 11/19/2016   Procedure: Revision Left Transmetatarsal Amputation;  Surgeon: Nadara Mustard, MD;  Location: [facility] OR;  Service: Orthopedics;  Laterality: Left;    Current Medications: Outpatient Medications Prior to Visit  Medication  Sig Dispense Refill  . albuterol (PROVENTIL HFA;VENTOLIN HFA) 108 (90 BASE) MCG/ACT inhaler Inhale 2 puffs into the lungs 2 (two) times daily. 0900 & 1700    . allopurinol (ZYLOPRIM) 100 MG tablet Take 100 mg by mouth daily.     Marland Kitchen aspirin 81 MG chewable tablet Chew 81 mg by mouth daily.    Marland Kitchen atorvastatin (LIPITOR) 40 MG tablet Take 1 tablet (40 mg total) by mouth daily at 6 PM.    . carvedilol (COREG) 3.125 MG tablet Take 1 tablet (3.125 mg total) by mouth 2 (two) times daily. 60 tablet 0  . cholecalciferol (VITAMIN D) 1000 units tablet Take 1,000 Units by mouth daily. (0900)    . clopidogrel (PLAVIX) 75 MG tablet Take 1 tablet (75 mg total) by mouth daily with breakfast. 30 tablet 6  . ferrous sulfate 325 (65 FE) MG tablet Take 1 tablet (325 mg total) by mouth 2 (two) times daily with a meal. 60 tablet 0  . furosemide  (LASIX) 40 MG tablet Take 1 tablet (40 mg total) by mouth 2 (two) times daily. 30 tablet   . gabapentin (NEURONTIN) 100 MG capsule Take 200 mg by mouth at bedtime.     Marland Kitchen guaiFENesin (MUCINEX) 600 MG 12 hr tablet Take 1,200 mg by mouth 2 (two) times daily as needed for cough.    Marland Kitchen ipratropium-albuterol (DUONEB) 0.5-2.5 (3) MG/3ML SOLN Take 3 mLs by nebulization 4 (four) times daily as needed (for shortness of breath).    Marland Kitchen levothyroxine (SYNTHROID, LEVOTHROID) 25 MCG tablet Take 1 tablet (25 mcg total) by mouth daily at 6 (six) AM.    . lisinopril (PRINIVIL,ZESTRIL) 5 MG tablet Take 1 tablet (5 mg total) by mouth daily. 30 tablet 0  . meloxicam (MOBIC) 7.5 MG tablet Take 7.5 mg by mouth daily.     . methocarbamol (ROBAXIN) 500 MG tablet Take 500 mg by mouth every 6 (six) hours as needed for muscle spasms.     . Multiple Vitamins-Minerals (MULTIVITAMIN WITH MINERALS) tablet Take 1 tablet by mouth daily. (0900)    . omeprazole (PRILOSEC) 20 MG capsule Take 20 mg by mouth daily at 6 (six) AM.     . oxyCODONE (OXY IR/ROXICODONE) 5 MG immediate release tablet Take 1-2 tablets (5-10 mg total) by mouth every 4 (four) hours as needed (5mg  for moderate pain, 10mg  for severe pain). 10 tablet 0  . potassium chloride SA (K-DUR,KLOR-CON) 20 MEQ tablet Take 1 tablet (20 mEq total) by mouth 2 (two) times daily.    Marland Kitchen saccharomyces boulardii (FLORASTOR) 250 MG capsule Take 250 mg by mouth 2 (two) times daily.    . traMADol (ULTRAM) 50 MG tablet Take 1 tablet (50 mg total) by mouth every 6 (six) hours as needed for moderate pain. 20 tablet 0  . traZODone (DESYREL) 50 MG tablet Take 50 mg by mouth at bedtime.      No facility-administered medications prior to visit.      Allergies:   Patient has no known allergies.   Social History   Social History  . Marital status: Single    Spouse name: N/A  . Number of children: N/A  . Years of education: N/A   Social History Main Topics  . Smoking status: Former  Smoker    Packs/day: 1.00    Years: 57.00    Types: Cigarettes    Quit date: 03/19/2016  . Smokeless tobacco: Never Used  . Alcohol use No     Comment: 11/10/2016  I was drinking alot of beer; quit in 2015"   . Drug use: No  . Sexual activity: No   Other Topics Concern  . None   Social History Narrative  . None     Family History:  The patient's family history includes Heart attack in his father; Heart failure in his brother and father.   Review of Systems:   Please see the history of present illness.     General:  No chills, fever, night sweats or weight changes. Positive for fatigue.  Cardiovascular:  No chest pain, dyspnea on exertion, edema, orthopnea, palpitations, paroxysmal nocturnal dyspnea. Dermatological: No rash, lesions/masses Respiratory: No cough, dyspnea Urologic: No hematuria, dysuria Abdominal:   No nausea, vomiting, diarrhea, bright red blood per rectum, melena, or hematemesis Neurologic:  No visual changes, wkns, changes in mental status. All other systems reviewed and are otherwise negative except as noted above.   Physical Exam:    VS:  BP (!) 103/54   Pulse (!) 58   Ht  (1.93 m)   SpO2 96%    General: Well developed, elderly Caucasian male appearing in no acute distress. Sitting in wheelchair with 2L [place].  Head: Normocephalic, atraumatic, sclera non-icteric, no xanthomas, nares are without discharge.  Neck: No carotid bruits. JVD not elevated.  Lungs: Respirations regular and unlabored, without wheezes or rales.  Heart: Regular rate and rhythm. No S3 or S4.  No rubs or gallops appreciated. 2/6 holosystolic murmur.  Abdomen: Soft, non-tender, non-distended with normoactive bowel sounds. No hepatomegaly. No rebound/guarding. No obvious abdominal masses. Msk:  Strength and tone appear normal for age. No joint deformities or effusions. Extremities: No clubbing or cyanosis. No edema. Right BKA. Distal pulses 1+ along LLE.  Neuro: Alert and oriented X 3.  Moves all extremities spontaneously. No focal deficits noted. Psych:  Responds to questions appropriately with a normal affect. Skin: No rashes or lesions noted  Wt Readings from Last 3 Encounters:  04/22/17 145 lb (65.8 kg)  04/04/17 154 lb 8 oz (70.1 kg)  03/22/17 169 lb 6.4 oz (76.8 kg)     Studies/Labs Reviewed:   EKG:  EKG is not ordered today.    Recent Labs: 03/30/2017: ALT 10 04/02/2017: TSH 7.110 04/20/2017: B Natriuretic Peptide 1,919.0; Hemoglobin 9.5; Platelets 467 04/22/2017: BUN 21; Creatinine, Ser 1.12; Potassium 4.0; Sodium 135   Lipid Panel    Component Value Date/Time   CHOL 116 04/03/2017 0548   TRIG 145 04/03/2017 0548   HDL 20 (L) 04/03/2017 0548   CHOLHDL 5.8 04/03/2017 0548   VLDL 29 04/03/2017 0548   LDLCALC 67 04/03/2017 0548    Additional studies/ records that were reviewed today include:   Cardiac Catheterization: 04/04/2017  LM lesion, 60 %stenosed. Calcified concentric.  Prox Cx lesion, 95 %stenosed. Mid Cx to Dist Cx lesion, 50 %stenosed.  Ramus lesion, 95 %stenosed.  Ost LAD lesion, 40 %stenosed.  Prox RCA lesion, 95 %stenosed.- likely non-dominant small caliber vessel.  There is severe left ventricular systolic dysfunction. The left ventricular ejection fraction is less than 25% by visual estimate.  LV end diastolic pressure is mildly elevated, but mosty normal Right Heart Pressures - indicates adequate diuresis.  There is moderate (3+) mitral regurgitation.   The patient has visible calcification on his proximal right and left coronary artery system. There is distal Left Main stenosis with severe disease in the proximal Circumflex and proximal Ramus Intermedius. The LAD itself is a relatively good target as is the distal Circumflex  and Ramus.  Ideally, he would be best served with CABG however understandably may not be the greatest of CABG candidate. If he has turned down for bypass surgery, would need to discuss with interventional  colleagues as it appears he may potentially need atherectomy base PCI of at least the Circumflex and Ramus lesions. If the decision is made to do Left Main stenting, with and without atherectomy, he would likely require left ventricular support given his reduced ejection fraction.  For now he'll be transferred to [facility] 2C  Stepdown Unit, and restarted on IV heparin until a decision is made about possible CABG. I have stopped Plavix.   Coronary Stent Intervention: 04/04/2017  LM lesion, 60 %stenosed. Calcified concentric.  Prox Cx lesion, 95 %stenosed. Mid Cx to Dist Cx lesion, 50 %stenosed.  Ramus lesion, 95 %stenosed.  Ost LAD lesion, 40 %stenosed.  Prox RCA lesion, 95 %stenosed.- likely non-dominant small caliber vessel.  There is severe left ventricular systolic dysfunction. The left ventricular ejection fraction is less than 25% by visual estimate.  LV end diastolic pressure is mildly elevated, but mosty normal Right Heart Pressures - indicates adequate diuresis.  There is moderate (3+) mitral regurgitation.   The patient has visible calcification on his proximal right and left coronary artery system. There is distal Left Main stenosis with severe disease in the proximal Circumflex and proximal Ramus Intermedius. The LAD itself is a relatively good target as is the distal Circumflex and Ramus.  Ideally, he would be best served with CABG however understandably may not be the greatest of CABG candidate. If he has turned down for bypass surgery, would need to discuss with interventional colleagues as it appears he may potentially need atherectomy base PCI of at least the Circumflex and Ramus lesions. If the decision is made to do Left Main stenting, with and without atherectomy, he would likely require left ventricular support given his reduced ejection fraction.  For now he'll be transferred to [facility] 2C  Stepdown Unit, and restarted on IV heparin until a decision is  made about possible CABG. I have stopped Plavix.  Echocardiogram: 03/31/2017 Study Conclusions  - Left ventricle: The cavity size was normal. Wall thickness was   normal. Systolic function was moderately to severely reduced. The   estimated ejection fraction was in the range of 30% to 35%.   Features are consistent with a pseudonormal left ventricular   filling pattern, with concomitant abnormal relaxation and   increased filling pressure (grade 2 diastolic dysfunction). - Aortic valve: There was very mild stenosis. Valve area (VTI):   1.15 cm^2. Valve area (Vmax): 1.08 cm^2. Valve area (Vmean): 1.04   cm^2. - Mitral valve: There was moderate regurgitation. - Left atrium: The atrium was severely dilated. - Right ventricle: Systolic function was mildly reduced. - Right atrium: The atrium was moderately to severely dilated. - Tricuspid valve: There was mild-moderate regurgitation. - Pulmonary arteries: Systolic pressure was moderately increased.   PA peak pressure: 57 mm Hg (S).   Assessment:    1. Coronary artery disease of native artery of native heart with stable angina pectoris (HCC)   2. Medication management   3. Chronic combined systolic and diastolic heart failure (HCC)   4. Ischemic cardiomyopathy   5. PVD (peripheral vascular disease) (HCC)   6. Mitral valve insufficiency, unspecified etiology   7. Hyperlipidemia LDL goal <70   8. CKD (chronic kidney disease) stage 3, GFR 30-59 ml/min (HCC)      Plan:  In order of problems listed above:  1. CAD - recent cardiac catheterization showed multivessel CAD with 95% Prox Cx stenosis, 60% LM, 95% Prox-RCA, and 40% Ost LAD stenosis with an EF of 25% and moderate MR. CT Surgery was consulted but he was deemed to be a poor surgical candidate, therefore he returned to the cath lab for rotational arthrectomy and stent placement of the LCx and OM1.  - he denies any recurrent chest pain. Not active at baseline secondary to BKA  therefore it is difficult to assess for exertional symptoms. - continue ASA, Plavix, statin, and BB therapy.   2. Chronic Combined Systolic and Diastolic CHF/ Ischemic Cardiomyopathy - EF was reduced to 30-35% when checked in 03/2017. - He does not appear volume overloaded by physical examination and denies any recent orthopnea, PND, or lower extremity edema. - continue Coreg 3.125mg  BID, Lisinopril  daily, and Lasix  BID. Will recheck BMET today to assess kidney function and K+ levels.   3. PVD - s/p R BKA.   4. Mitral Regurgitation - moderate by recent echocardiogram. Continue to follow.   5. HLD - Lipid Panel in 03/2017 showed total cholesterol of 116, HDL 20, and LDL 67. At goal of LDL < 70. - continue Atorvastatin  daily.   6. Stage 3 CKD - baseline creatinine 1.0 - 1.1. Repeat BMET today.   Medication Adjustments/Labs and Tests Ordered: Current medicines are reviewed at length with the patient today.  Concerns regarding medicines are outlined above.  Medication changes, Labs and Tests ordered today are listed in the Patient Instructions below. Patient Instructions  Medication Instructions: Your physician recommends that you continue on your current medications as directed. Please refer to the Current Medication list given to you today.  If you need a refill on your cardiac medications before your next appointment, please call your pharmacy.   Labwork: Your physician recommends that you have a BMET drawn today.   Follow-Up: Your physician wants you to follow-up in: 3 months with Dr. Allyson Sabal. You will receive a reminder letter in the mail two months in advance. If you don't receive a letter, please call our office at [phone] to schedule this follow-up appointment.  Thank you for choosing Heartcare at [facility]!!      Signed, Ellsworth Lennox, PA-C  04/26/2017 1:24 PM    [facility] [address] [place], [address] [place], [place]   [address] Phone: [phone]; Fax: [phone]  [address] [place], [place] [address] Phone: [phone]

## 2017-04-26 ENCOUNTER — Encounter: Payer: Self-pay | Admitting: Student

## 2017-04-26 ENCOUNTER — Ambulatory Visit (INDEPENDENT_AMBULATORY_CARE_PROVIDER_SITE_OTHER): Payer: Medicare HMO | Admitting: Student

## 2017-04-26 VITALS — BP 103/54 | HR 58 | Ht 76.0 in

## 2017-04-26 DIAGNOSIS — E785 Hyperlipidemia, unspecified: Secondary | ICD-10-CM | POA: Diagnosis not present

## 2017-04-26 DIAGNOSIS — I5042 Chronic combined systolic (congestive) and diastolic (congestive) heart failure: Secondary | ICD-10-CM | POA: Diagnosis not present

## 2017-04-26 DIAGNOSIS — N183 Chronic kidney disease, stage 3 unspecified: Secondary | ICD-10-CM

## 2017-04-26 DIAGNOSIS — I739 Peripheral vascular disease, unspecified: Secondary | ICD-10-CM

## 2017-04-26 DIAGNOSIS — I340 Nonrheumatic mitral (valve) insufficiency: Secondary | ICD-10-CM | POA: Diagnosis not present

## 2017-04-26 DIAGNOSIS — Z79899 Other long term (current) drug therapy: Secondary | ICD-10-CM

## 2017-04-26 DIAGNOSIS — I25118 Atherosclerotic heart disease of native coronary artery with other forms of angina pectoris: Secondary | ICD-10-CM

## 2017-04-26 DIAGNOSIS — I255 Ischemic cardiomyopathy: Secondary | ICD-10-CM | POA: Diagnosis not present

## 2017-04-26 LAB — BASIC METABOLIC PANEL
BUN/Creatinine Ratio: 23 (ref 10–24)
BUN: 24 mg/dL (ref 8–27)
CALCIUM: 8.9 mg/dL (ref 8.6–10.2)
CHLORIDE: 102 mmol/L (ref 96–106)
CO2: 24 mmol/L (ref 20–29)
Creatinine, Ser: 1.06 mg/dL (ref 0.76–1.27)
GFR calc Af Amer: 79 mL/min/{1.73_m2} (ref >59)
GFR, EST NON AFRICAN AMERICAN: 68 mL/min/{1.73_m2} (ref >59)
Glucose: 71 mg/dL (ref 65–99)
POTASSIUM: 5.2 mmol/L (ref 3.5–5.2)
Sodium: 143 mmol/L (ref 134–144)

## 2017-04-26 NOTE — Patient Instructions (Addendum)
Medication Instructions: Your physician recommends that you continue on your current medications as directed. Please refer to the Current Medication list given to you today.  If you need a refill on your cardiac medications before your next appointment, please call your pharmacy.   Labwork: Your physician recommends that you have a BMET drawn today.   Follow-Up: Your physician wants you to follow-up in: 3 months with Dr. Allyson Sabal. You will receive a reminder letter in the mail two months in advance. If you don't receive a letter, please call our office at [phone] to schedule this follow-up appointment.    Thank you for choosing Heartcare at [facility]!!

## 2017-05-31 ENCOUNTER — Ambulatory Visit (INDEPENDENT_AMBULATORY_CARE_PROVIDER_SITE_OTHER): Payer: Medicare HMO | Admitting: Pulmonary Disease

## 2017-05-31 ENCOUNTER — Encounter: Payer: Self-pay | Admitting: Pulmonary Disease

## 2017-05-31 VITALS — BP 118/70 | HR 65 | Ht 76.0 in | Wt 175.0 lb

## 2017-05-31 DIAGNOSIS — R0602 Shortness of breath: Secondary | ICD-10-CM | POA: Diagnosis not present

## 2017-05-31 NOTE — Progress Notes (Signed)
Caleb Hays    [id number]    12-03-1940  Primary Care Physician:Patient, No Pcp Per  Referring Physician: No referring provider defined for this encounter.  Chief complaint: Consult for evaluation of COPD  HPI: 76 year old with coronary artery disease, COPD, peripheral artery disease.  He had a couple of admissions in September for respiratory failure, recurrent right pleural effusion thought to be secondary to acute on chronic heart failure.  He underwent a thoracentesis x 2 on  8/31 and 03/31/17 with removal of 2 L fluid and was treated with antibiotics.  Cytology was negative for malignancy.  He has been referred for evaluation of COPD (CAT score 13).  He has extensive smoking history.  Quit in 2017.  He is currently maintained on DuoNeb's 4 times a day.  He has symptoms of chronic dyspnea with activity, nonproductive cough.  Denies any symptoms at rest, wheezing, fevers, chills.  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  Outpatient Encounter Medications as of 05/31/2017  Medication Sig  . albuterol (PROVENTIL HFA;VENTOLIN HFA) 108 (90 BASE) MCG/ACT inhaler Inhale 2 puffs into the lungs 2 (two) times daily. 0900 & 1700  . allopurinol (ZYLOPRIM) 100 MG tablet Take 100 mg by mouth daily.   Marland Kitchen aspirin 81 MG chewable tablet Chew 81 mg by mouth daily.  Marland Kitchen atorvastatin (LIPITOR) 40 MG tablet Take 1 tablet (40 mg total) by mouth daily at 6 PM.  . carvedilol (COREG) 3.125 MG tablet Take 1 tablet (3.125 mg total) by mouth 2 (two) times daily.  . cholecalciferol (VITAMIN D) 1000 units tablet Take 1,000 Units by mouth daily. (0900)  . clopidogrel (PLAVIX) 75 MG tablet Take 1 tablet (75 mg total) by mouth daily with breakfast.  . ferrous sulfate 325 (65 FE) MG tablet Take 1 tablet (325 mg total) by mouth 2 (two) times daily with a meal.  . furosemide (LASIX) 40 MG tablet Take 1 tablet (40 mg total) by mouth 2 (two) times daily.  Marland Kitchen gabapentin  (NEURONTIN) 100 MG capsule Take 200 mg by mouth at bedtime.   Marland Kitchen guaiFENesin (MUCINEX) 600 MG 12 hr tablet Take 1,200 mg by mouth 2 (two) times daily as needed for cough.  Marland Kitchen ipratropium-albuterol (DUONEB) 0.5-2.5 (3) MG/3ML SOLN Take 3 mLs by nebulization 4 (four) times daily as needed (for shortness of breath).  Marland Kitchen levothyroxine (SYNTHROID, LEVOTHROID) 25 MCG tablet Take 1 tablet (25 mcg total) by mouth daily at 6 (six) AM.  . lisinopril (PRINIVIL,ZESTRIL) 5 MG tablet Take 1 tablet (5 mg total) by mouth daily.  . meloxicam (MOBIC) 7.5 MG tablet Take 7.5 mg by mouth daily.   . methocarbamol (ROBAXIN) 500 MG tablet Take 500 mg by mouth every 6 (six) hours as needed for muscle spasms.   . Multiple Vitamins-Minerals (MULTIVITAMIN WITH MINERALS) tablet Take 1 tablet by mouth daily. (0900)  . omeprazole (PRILOSEC) 20 MG capsule Take 20 mg by mouth daily at 6 (six) AM.   . oxyCODONE (OXY IR/ROXICODONE) 5 MG immediate release tablet Take 1-2 tablets (5-10 mg total) by mouth every 4 (four) hours as needed (5mg  for moderate pain, 10mg  for severe pain).  . potassium chloride SA (K-DUR,KLOR-CON) 20 MEQ tablet Take 1 tablet (20 mEq total) by mouth 2 (two) times daily.  Marland Kitchen saccharomyces boulardii (FLORASTOR) 250 MG capsule Take 250 mg by mouth 2 (two) times daily.  . traMADol (ULTRAM) 50 MG tablet Take 1 tablet (50 mg total) by  mouth every 6 (six) hours as needed for moderate pain.  . traZODone (DESYREL) 50 MG tablet Take 50 mg by mouth at bedtime.    No facility-administered encounter medications on file as of 05/31/2017.     Allergies as of 05/31/2017  . (No Known Allergies)    Past Medical History:  Diagnosis Date  . Acute on chronic combined systolic (congestive) and diastolic (congestive) heart failure (HCC) 04/2017  . Adult failure to thrive    /notes 06/23/2015  . Anemia   . Arthritis    "hands, fingers" (11/10/2016  . CAD (coronary artery disease)    a. 03/2017: multivessel CAD and not felt to  be a CABG candidate, s/p DESx2 to LCx and OM1.   . CKD (chronic kidney disease), stage III (HCC)   . COPD (chronic obstructive pulmonary disease) (HCC)    LONG TIME SMOKER  . Critical lower limb ischemia   . 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 Depression    "periods of depression" (06/23/2015)  . Gout   . Hypothyroidism   . PAD (peripheral artery disease) (HCC)   . Physical deconditioning   . Pyelonephritis 06/23/2015  . Status post foot surgery    right fifth toe amputation by Dr. Lajoyce Cornersuda     Past Surgical History:  Procedure Laterality Date  . CATARACT EXTRACTION W/ INTRAOCULAR LENS  IMPLANT, BILATERAL Bilateral   . INGUINAL HERNIA REPAIR Left   . IR THORACENTESIS ASP PLEURAL SPACE W/IMG GUIDE  03/18/2017  . KNEE CARTILAGE SURGERY Left 1960's   football injury  . KNEE LIGAMENT RECONSTRUCTION Left 1960's  . PERIPHERAL VASCULAR INTERVENTION  11/10/2016   Hattie Perch/notes 11/10/2016  . REFRACTIVE SURGERY Bilateral     Family History  Problem Relation Age of Onset  . Heart failure Father   . Heart attack Father   . Heart failure Brother     Social History   Socioeconomic History  . Marital status: Single    Spouse name: Not on file  . Number of children: Not on file  . Years of education: Not on file  . Highest education level: Not on file  Social Needs  . Financial resource strain: Not on file  . Food insecurity - worry: Not on file  . Food insecurity - inability: Not on file  . Transportation needs - medical: Not on file  . Transportation needs - non-medical: Not on file  Occupational History  . Not on file  Tobacco Use  . Smoking status: Former Smoker    Packs/day: 1.00    Years: 57.00    Pack years: 57.00    Types: Cigarettes    Last attempt to quit: 03/19/2016    Years since quitting: 1.2  . Smokeless tobacco: Never Used  Substance and Sexual Activity  . Alcohol use: No    Comment: 11/10/2016 I was drinking alot of beer; quit in  2015"   . Drug use: No  . Sexual activity: No  Other Topics Concern  . Not on file  Social History Narrative  . Not on file   Review of systems: Review of Systems  Constitutional: Negative for fever and chills.  HENT: Negative.   Eyes: Negative for blurred vision.  Respiratory: as per HPI  Cardiovascular: Negative for chest pain and palpitations.  Gastrointestinal: Negative for vomiting, diarrhea, blood per rectum. Genitourinary: Negative for dysuria, urgency, frequency and hematuria.  Musculoskeletal: Negative for myalgias, back pain and joint pain.  Skin:  Negative for itching and rash.  Neurological: Negative for dizziness, tremors, focal weakness, seizures and loss of consciousness.  Endo/Heme/Allergies: Negative for environmental allergies.  Psychiatric/Behavioral: Negative for depression, suicidal ideas and hallucinations.  All other systems reviewed and are negative.  Physical Exam: Blood pressure 118/70, pulse 65, height 6\' 4"  (1.93 m), weight 175 lb (79.4 kg), SpO2 97 %. Gen:      No acute distress HEENT:  EOMI, sclera anicteric Neck:     No masses; no thyromegaly Lungs:    Clear to auscultation bilaterally; normal respiratory effort CV:         Regular rate and rhythm; no murmurs Abd:      + bowel sounds; soft, non-tender; no palpable masses, no distension Ext:    No edema; adequate peripheral perfusion Skin:      Warm and dry; no rash Neuro: alert and oriented x 3 Psych: normal mood and affect  Data Reviewed: CT chest 03/17/17-coronary artery disease, large pleural effusion with possible loculation, subsegmental atelectasis Emphysematous changes.  Severe splenomegaly. Chest x-ray 04/22/17-persistent right effusion with atelectasis.  Echo 03/31/17-EF 30-35%, grade 2 diastolic dysfunction, moderate MR with severe left atrium dilatation.  Mild aortic stenosis RV systolic function is mildly reduced with mild to moderate TR, severe dilatation of the right atrium. PA peak  pressure of 57  Pleural fluid studies 03/18/17-WBC 193, 30% lymphs, 12% neutrophils, 58% monocyte macrophage 03/31/17-WBC 231, 30% lymphs, 4% neutrophils, 65% monocyte macrophage  Cytology 8/31, 03/31/17-reactive cells with acute inflammation.  No malignancy identified  Assessment:  Assessment for COPD Currently stable on duo nebs.  Will continue the same Schedule pulmonary function test for better assessment of lung function.  Recurrent right pleural effusion This was thought to be secondary to heart failure although it would be unusual to have a unilateral effusion. 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  We will get a report of this for evaluation.  If there is persistent effusion we may consider a repeat chest CT.  Plan/Recommendations: - Continue duo nebs - Schedule pulmonary function test - Obtain report of recent chest x-ray.  Chilton GreathousePraveen Mung Rinker MD [facility] Pager [phone] 05/31/2017, 2:37 PM  CC: No ref. provider found

## 2017-05-31 NOTE — Patient Instructions (Signed)
We will get a report of your recent x-ray done at the nursing home. Continue using the duo nebs as you are doing now We schedule you for pulmonary function test and follow-up in 1-2 months.

## 2017-06-29 ENCOUNTER — Other Ambulatory Visit: Payer: Self-pay | Admitting: Pulmonary Disease

## 2017-06-29 DIAGNOSIS — R0600 Dyspnea, unspecified: Secondary | ICD-10-CM

## 2017-06-30 ENCOUNTER — Ambulatory Visit: Payer: Medicare HMO | Admitting: Pulmonary Disease

## 2017-07-11 ENCOUNTER — Encounter (HOSPITAL_COMMUNITY): Payer: Self-pay

## 2017-07-11 ENCOUNTER — Other Ambulatory Visit: Payer: Self-pay

## 2017-07-11 ENCOUNTER — Emergency Department (HOSPITAL_COMMUNITY): Payer: Medicare HMO

## 2017-07-11 ENCOUNTER — Inpatient Hospital Stay (HOSPITAL_COMMUNITY): Payer: Medicare HMO | Admitting: Registered Nurse

## 2017-07-11 ENCOUNTER — Inpatient Hospital Stay (HOSPITAL_COMMUNITY): Payer: Medicare HMO

## 2017-07-11 ENCOUNTER — Inpatient Hospital Stay (HOSPITAL_COMMUNITY)
Admission: EM | Admit: 2017-07-11 | Discharge: 2017-07-19 | DRG: 208 | Disposition: E | Payer: Medicare HMO | Attending: Pulmonary Disease | Admitting: Pulmonary Disease

## 2017-07-11 DIAGNOSIS — Z9842 Cataract extraction status, left eye: Secondary | ICD-10-CM

## 2017-07-11 DIAGNOSIS — J189 Pneumonia, unspecified organism: Secondary | ICD-10-CM | POA: Diagnosis present

## 2017-07-11 DIAGNOSIS — E039 Hypothyroidism, unspecified: Secondary | ICD-10-CM | POA: Diagnosis present

## 2017-07-11 DIAGNOSIS — R627 Adult failure to thrive: Secondary | ICD-10-CM | POA: Diagnosis present

## 2017-07-11 DIAGNOSIS — I4891 Unspecified atrial fibrillation: Secondary | ICD-10-CM

## 2017-07-11 DIAGNOSIS — I739 Peripheral vascular disease, unspecified: Secondary | ICD-10-CM | POA: Diagnosis present

## 2017-07-11 DIAGNOSIS — I340 Nonrheumatic mitral (valve) insufficiency: Secondary | ICD-10-CM | POA: Diagnosis present

## 2017-07-11 DIAGNOSIS — M19041 Primary osteoarthritis, right hand: Secondary | ICD-10-CM | POA: Diagnosis present

## 2017-07-11 DIAGNOSIS — W868XXA Exposure to other electric current, initial encounter: Secondary | ICD-10-CM | POA: Diagnosis not present

## 2017-07-11 DIAGNOSIS — J9621 Acute and chronic respiratory failure with hypoxia: Secondary | ICD-10-CM | POA: Diagnosis present

## 2017-07-11 DIAGNOSIS — R64 Cachexia: Secondary | ICD-10-CM | POA: Diagnosis present

## 2017-07-11 DIAGNOSIS — Z4659 Encounter for fitting and adjustment of other gastrointestinal appliance and device: Secondary | ICD-10-CM

## 2017-07-11 DIAGNOSIS — I5043 Acute on chronic combined systolic (congestive) and diastolic (congestive) heart failure: Secondary | ICD-10-CM | POA: Diagnosis present

## 2017-07-11 DIAGNOSIS — Z9841 Cataract extraction status, right eye: Secondary | ICD-10-CM | POA: Diagnosis not present

## 2017-07-11 DIAGNOSIS — M109 Gout, unspecified: Secondary | ICD-10-CM | POA: Diagnosis present

## 2017-07-11 DIAGNOSIS — Z89511 Acquired absence of right leg below knee: Secondary | ICD-10-CM

## 2017-07-11 DIAGNOSIS — I469 Cardiac arrest, cause unspecified: Secondary | ICD-10-CM | POA: Diagnosis not present

## 2017-07-11 DIAGNOSIS — M19042 Primary osteoarthritis, left hand: Secondary | ICD-10-CM | POA: Diagnosis present

## 2017-07-11 DIAGNOSIS — Z955 Presence of coronary angioplasty implant and graft: Secondary | ICD-10-CM

## 2017-07-11 DIAGNOSIS — J9690 Respiratory failure, unspecified, unspecified whether with hypoxia or hypercapnia: Secondary | ICD-10-CM

## 2017-07-11 DIAGNOSIS — J9601 Acute respiratory failure with hypoxia: Secondary | ICD-10-CM

## 2017-07-11 DIAGNOSIS — R0602 Shortness of breath: Secondary | ICD-10-CM | POA: Diagnosis present

## 2017-07-11 DIAGNOSIS — I255 Ischemic cardiomyopathy: Secondary | ICD-10-CM | POA: Diagnosis present

## 2017-07-11 DIAGNOSIS — I959 Hypotension, unspecified: Secondary | ICD-10-CM | POA: Diagnosis not present

## 2017-07-11 DIAGNOSIS — I2510 Atherosclerotic heart disease of native coronary artery without angina pectoris: Secondary | ICD-10-CM | POA: Diagnosis present

## 2017-07-11 DIAGNOSIS — Z7902 Long term (current) use of antithrombotics/antiplatelets: Secondary | ICD-10-CM

## 2017-07-11 DIAGNOSIS — N183 Chronic kidney disease, stage 3 (moderate): Secondary | ICD-10-CM | POA: Diagnosis present

## 2017-07-11 DIAGNOSIS — E440 Moderate protein-calorie malnutrition: Secondary | ICD-10-CM | POA: Diagnosis present

## 2017-07-11 DIAGNOSIS — R570 Cardiogenic shock: Secondary | ICD-10-CM | POA: Diagnosis not present

## 2017-07-11 DIAGNOSIS — F1010 Alcohol abuse, uncomplicated: Secondary | ICD-10-CM | POA: Diagnosis present

## 2017-07-11 DIAGNOSIS — Z961 Presence of intraocular lens: Secondary | ICD-10-CM | POA: Diagnosis present

## 2017-07-11 DIAGNOSIS — Z9981 Dependence on supplemental oxygen: Secondary | ICD-10-CM

## 2017-07-11 DIAGNOSIS — A419 Sepsis, unspecified organism: Secondary | ICD-10-CM

## 2017-07-11 DIAGNOSIS — E785 Hyperlipidemia, unspecified: Secondary | ICD-10-CM | POA: Diagnosis present

## 2017-07-11 DIAGNOSIS — T2101XA Burn of unspecified degree of chest wall, initial encounter: Secondary | ICD-10-CM | POA: Diagnosis not present

## 2017-07-11 DIAGNOSIS — R0600 Dyspnea, unspecified: Secondary | ICD-10-CM | POA: Diagnosis not present

## 2017-07-11 DIAGNOSIS — Z452 Encounter for adjustment and management of vascular access device: Secondary | ICD-10-CM

## 2017-07-11 DIAGNOSIS — Z6823 Body mass index (BMI) 23.0-23.9, adult: Secondary | ICD-10-CM

## 2017-07-11 DIAGNOSIS — J90 Pleural effusion, not elsewhere classified: Secondary | ICD-10-CM

## 2017-07-11 DIAGNOSIS — Z8249 Family history of ischemic heart disease and other diseases of the circulatory system: Secondary | ICD-10-CM

## 2017-07-11 DIAGNOSIS — K219 Gastro-esophageal reflux disease without esophagitis: Secondary | ICD-10-CM | POA: Diagnosis present

## 2017-07-11 DIAGNOSIS — J9620 Acute and chronic respiratory failure, unspecified whether with hypoxia or hypercapnia: Secondary | ICD-10-CM | POA: Diagnosis present

## 2017-07-11 DIAGNOSIS — Z66 Do not resuscitate: Secondary | ICD-10-CM | POA: Diagnosis not present

## 2017-07-11 DIAGNOSIS — J439 Emphysema, unspecified: Secondary | ICD-10-CM | POA: Diagnosis present

## 2017-07-11 DIAGNOSIS — R161 Splenomegaly, not elsewhere classified: Secondary | ICD-10-CM

## 2017-07-11 DIAGNOSIS — Z7982 Long term (current) use of aspirin: Secondary | ICD-10-CM

## 2017-07-11 DIAGNOSIS — Y95 Nosocomial condition: Secondary | ICD-10-CM | POA: Diagnosis present

## 2017-07-11 DIAGNOSIS — N170 Acute kidney failure with tubular necrosis: Secondary | ICD-10-CM | POA: Diagnosis present

## 2017-07-11 DIAGNOSIS — G9341 Metabolic encephalopathy: Secondary | ICD-10-CM | POA: Diagnosis not present

## 2017-07-11 DIAGNOSIS — Z9889 Other specified postprocedural states: Secondary | ICD-10-CM

## 2017-07-11 DIAGNOSIS — Z791 Long term (current) use of non-steroidal anti-inflammatories (NSAID): Secondary | ICD-10-CM

## 2017-07-11 DIAGNOSIS — I4901 Ventricular fibrillation: Secondary | ICD-10-CM | POA: Diagnosis not present

## 2017-07-11 DIAGNOSIS — E875 Hyperkalemia: Secondary | ICD-10-CM | POA: Diagnosis not present

## 2017-07-11 DIAGNOSIS — L89159 Pressure ulcer of sacral region, unspecified stage: Secondary | ICD-10-CM | POA: Diagnosis present

## 2017-07-11 DIAGNOSIS — Z87891 Personal history of nicotine dependence: Secondary | ICD-10-CM

## 2017-07-11 DIAGNOSIS — L8990 Pressure ulcer of unspecified site, unspecified stage: Secondary | ICD-10-CM | POA: Diagnosis present

## 2017-07-11 DIAGNOSIS — Z79890 Hormone replacement therapy (postmenopausal): Secondary | ICD-10-CM

## 2017-07-11 DIAGNOSIS — Z978 Presence of other specified devices: Secondary | ICD-10-CM

## 2017-07-11 DIAGNOSIS — G629 Polyneuropathy, unspecified: Secondary | ICD-10-CM | POA: Diagnosis present

## 2017-07-11 LAB — BLOOD GAS, ARTERIAL
Acid-base deficit: 4.2 mmol/L — ABNORMAL HIGH (ref 0.0–2.0)
Acid-base deficit: 9 mmol/L — ABNORMAL HIGH (ref 0.0–2.0)
BICARBONATE: 16.2 mmol/L — AB (ref 20.0–28.0)
Bicarbonate: 20.3 mmol/L (ref 20.0–28.0)
DRAWN BY: 331471
DRAWN BY: 232811
FIO2: 100
FIO2: 100
LHR: 16 {breaths}/min
MECHVT: 580 mL
O2 CONTENT: 15 L/min
O2 Saturation: 99.5 %
O2 Saturation: 99.9 %
PEEP: 5 cmH2O
PO2 ART: 440 mmHg — AB (ref 83.0–108.0)
Patient temperature: 98.6
Patient temperature: 96.9
pCO2 arterial: 37.2 mmHg (ref 32.0–48.0)
pCO2 arterial: 32.8 mmHg (ref 32.0–48.0)
pH, Arterial: 7.356 (ref 7.350–7.450)
pH, Arterial: 7.31 — ABNORMAL LOW (ref 7.350–7.450)
pO2, Arterial: 342 mmHg — ABNORMAL HIGH (ref 83.0–108.0)

## 2017-07-11 LAB — COMPREHENSIVE METABOLIC PANEL
ALBUMIN: 3.1 g/dL — AB (ref 3.5–5.0)
ALK PHOS: 63 U/L (ref 38–126)
ALK PHOS: 74 U/L (ref 38–126)
ALT: 8 U/L — ABNORMAL LOW (ref 17–63)
ALT: 12 U/L — ABNORMAL LOW (ref 17–63)
ANION GAP: 7 (ref 5–15)
AST: 11 U/L — AB (ref 15–41)
AST: 21 U/L (ref 15–41)
Albumin: 2.9 g/dL — ABNORMAL LOW (ref 3.5–5.0)
Anion gap: 8 (ref 5–15)
BILIRUBIN TOTAL: 0.9 mg/dL (ref 0.3–1.2)
BILIRUBIN TOTAL: 1.1 mg/dL (ref 0.3–1.2)
BUN: 41 mg/dL — AB (ref 6–20)
BUN: 42 mg/dL — ABNORMAL HIGH (ref 6–20)
CALCIUM: 8.2 mg/dL — AB (ref 8.9–10.3)
CO2: 21 mmol/L — ABNORMAL LOW (ref 22–32)
CO2: 20 mmol/L — ABNORMAL LOW (ref 22–32)
Calcium: 7.7 mg/dL — ABNORMAL LOW (ref 8.9–10.3)
Chloride: 107 mmol/L (ref 101–111)
Chloride: 106 mmol/L (ref 101–111)
Creatinine, Ser: 1.43 mg/dL — ABNORMAL HIGH (ref 0.61–1.24)
Creatinine, Ser: 1.56 mg/dL — ABNORMAL HIGH (ref 0.61–1.24)
GFR calc Af Amer: 54 mL/min — ABNORMAL LOW (ref >60)
GFR calc non Af Amer: 46 mL/min — ABNORMAL LOW (ref >60)
GFR, EST AFRICAN AMERICAN: 48 mL/min — AB (ref >60)
GFR, EST NON AFRICAN AMERICAN: 42 mL/min — AB (ref >60)
GLUCOSE: 81 mg/dL (ref 65–99)
Glucose, Bld: 225 mg/dL — ABNORMAL HIGH (ref 65–99)
POTASSIUM: 4.8 mmol/L (ref 3.5–5.1)
Potassium: 5.3 mmol/L — ABNORMAL HIGH (ref 3.5–5.1)
Sodium: 136 mmol/L (ref 135–145)
Sodium: 133 mmol/L — ABNORMAL LOW (ref 135–145)
TOTAL PROTEIN: 5.7 g/dL — AB (ref 6.5–8.1)
TOTAL PROTEIN: 5.5 g/dL — AB (ref 6.5–8.1)

## 2017-07-11 LAB — CBC WITH DIFFERENTIAL/PLATELET
Basophils Absolute: 0 10*3/uL (ref 0.0–0.1)
Basophils Relative: 0 %
Eosinophils Absolute: 0 10*3/uL (ref 0.0–0.7)
Eosinophils Relative: 0 %
HEMATOCRIT: 30.2 % — AB (ref 39.0–52.0)
Hemoglobin: 9.3 g/dL — ABNORMAL LOW (ref 13.0–17.0)
LYMPHS ABS: 1.5 10*3/uL (ref 0.7–4.0)
Lymphocytes Relative: 10 %
MCH: 22 pg — AB (ref 26.0–34.0)
MCHC: 30.8 g/dL (ref 30.0–36.0)
MCV: 71.4 fL — AB (ref 78.0–100.0)
MONO ABS: 2.6 10*3/uL — AB (ref 0.1–1.0)
Metamyelocytes Relative: 5 %
Monocytes Relative: 17 %
Myelocytes: 13 %
NEUTROS ABS: 11.1 10*3/uL — AB (ref 1.7–7.7)
NEUTROS PCT: 55 %
NRBC: 1 /100{WBCs} — AB
PLATELETS: 151 10*3/uL (ref 150–400)
RBC: 4.23 MIL/uL (ref 4.22–5.81)
RDW: 18 % — AB (ref 11.5–15.5)
WBC: 15.2 10*3/uL — AB (ref 4.0–10.5)

## 2017-07-11 LAB — URINALYSIS, ROUTINE W REFLEX MICROSCOPIC
BILIRUBIN URINE: NEGATIVE
Glucose, UA: NEGATIVE mg/dL
Ketones, ur: NEGATIVE mg/dL
Nitrite: NEGATIVE
PH: 5 (ref 5.0–8.0)
Protein, ur: 30 mg/dL — AB
SPECIFIC GRAVITY, URINE: 1.01 (ref 1.005–1.030)
SQUAMOUS EPITHELIAL / LPF: NONE SEEN

## 2017-07-11 LAB — BODY FLUID CELL COUNT WITH DIFFERENTIAL
EOS FL: 1 %
Lymphs, Fluid: 82 %
MONOCYTE-MACROPHAGE-SEROUS FLUID: 15 % — AB (ref 50–90)
Neutrophil Count, Fluid: 2 % (ref 0–25)
WBC FLUID: 215 uL (ref 0–1000)

## 2017-07-11 LAB — LACTATE DEHYDROGENASE: LDH: 444 U/L — AB (ref 98–192)

## 2017-07-11 LAB — CBC
HCT: 32.6 % — ABNORMAL LOW (ref 39.0–52.0)
Hemoglobin: 10.3 g/dL — ABNORMAL LOW (ref 13.0–17.0)
MCH: 22.7 pg — AB (ref 26.0–34.0)
MCHC: 31.6 g/dL (ref 30.0–36.0)
MCV: 71.8 fL — ABNORMAL LOW (ref 78.0–100.0)
PLATELETS: 216 10*3/uL (ref 150–400)
RBC: 4.54 MIL/uL (ref 4.22–5.81)
RDW: 18.2 % — ABNORMAL HIGH (ref 11.5–15.5)
WBC: 45 10*3/uL — AB (ref 4.0–10.5)

## 2017-07-11 LAB — LACTATE DEHYDROGENASE, PLEURAL OR PERITONEAL FLUID: LD, Fluid: 136 U/L — ABNORMAL HIGH (ref 3–23)

## 2017-07-11 LAB — MAGNESIUM
MAGNESIUM: 2.5 mg/dL — AB (ref 1.7–2.4)
Magnesium: 1.9 mg/dL (ref 1.7–2.4)

## 2017-07-11 LAB — PROTIME-INR
INR: 1.23
Prothrombin Time: 15.4 seconds — ABNORMAL HIGH (ref 11.4–15.2)

## 2017-07-11 LAB — GLUCOSE, PLEURAL OR PERITONEAL FLUID: Glucose, Fluid: 76 mg/dL

## 2017-07-11 LAB — TSH: TSH: 11.503 u[IU]/mL — AB (ref 0.350–4.500)

## 2017-07-11 LAB — APTT: aPTT: 31 seconds (ref 24–36)

## 2017-07-11 LAB — PROTEIN, TOTAL: Total Protein: 5.7 g/dL — ABNORMAL LOW (ref 6.5–8.1)

## 2017-07-11 LAB — I-STAT CG4 LACTIC ACID, ED: Lactic Acid, Venous: 1.26 mmol/L (ref 0.5–1.9)

## 2017-07-11 LAB — BRAIN NATRIURETIC PEPTIDE: B Natriuretic Peptide: 508.4 pg/mL — ABNORMAL HIGH (ref 0.0–100.0)

## 2017-07-11 LAB — TROPONIN I: Troponin I: <0.03 ng/mL (ref <0.03)

## 2017-07-11 LAB — MRSA PCR SCREENING: MRSA BY PCR: POSITIVE — AB

## 2017-07-11 MED ORDER — SODIUM CHLORIDE 0.9 % IV BOLUS (SEPSIS)
1000.0000 mL | Freq: Once | INTRAVENOUS | Status: AC
  Administered 2017-07-11: 1000 mL via INTRAVENOUS

## 2017-07-11 MED ORDER — ACETAMINOPHEN 160 MG/5ML PO SOLN
650.0000 mg | Freq: Four times a day (QID) | ORAL | Status: DC | PRN
  Administered 2017-07-13 – 2017-07-14 (×2): 650 mg
  Filled 2017-07-11 (×2): qty 20.3

## 2017-07-11 MED ORDER — NOREPINEPHRINE BITARTRATE 1 MG/ML IV SOLN: 0.0000 ug/min | INTRAVENOUS | Status: DC

## 2017-07-11 MED ORDER — ACETAMINOPHEN 325 MG PO TABS: 650.0000 mg | ORAL_TABLET | Freq: Four times a day (QID) | ORAL | Status: DC | PRN

## 2017-07-11 MED ORDER — TRAZODONE HCL 50 MG PO TABS: 50.0000 mg | ORAL_TABLET | Freq: Every day | ORAL | Status: DC

## 2017-07-11 MED ORDER — ADULT MULTIVITAMIN W/MINERALS CH
1.0000 | ORAL_TABLET | Freq: Every day | ORAL | Status: DC
  Filled 2017-07-11: qty 1

## 2017-07-11 MED ORDER — VANCOMYCIN HCL IN DEXTROSE 1-5 GM/200ML-% IV SOLN
1000.0000 mg | Freq: Once | INTRAVENOUS | Status: AC
  Administered 2017-07-11: 1000 mg via INTRAVENOUS
  Filled 2017-07-11: qty 200

## 2017-07-11 MED ORDER — ACETAMINOPHEN 650 MG RE SUPP: 650.0000 mg | RECTAL | Status: DC | PRN

## 2017-07-11 MED ORDER — ALBUTEROL (5 MG/ML) CONTINUOUS INHALATION SOLN
7.5000 mg/h | INHALATION_SOLUTION | RESPIRATORY_TRACT | Status: DC
  Administered 2017-07-11: 7.5 mg/h via RESPIRATORY_TRACT
  Filled 2017-07-11: qty 20

## 2017-07-11 MED ORDER — PANTOPRAZOLE SODIUM 40 MG PO PACK
40.0000 mg | PACK | ORAL | Status: DC
  Administered 2017-07-12 – 2017-07-13 (×2): 40 mg
  Filled 2017-07-11 (×2): qty 20

## 2017-07-11 MED ORDER — ATORVASTATIN CALCIUM 20 MG PO TABS
20.0000 mg | ORAL_TABLET | Freq: Every day | ORAL | Status: DC
  Administered 2017-07-12 – 2017-07-13 (×2): 20 mg
  Filled 2017-07-11: qty 1
  Filled 2017-07-11 (×2): qty 2
  Filled 2017-07-11: qty 1

## 2017-07-11 MED ORDER — FENTANYL CITRATE (PF) 100 MCG/2ML IJ SOLN
50.0000 ug | INTRAMUSCULAR | Status: DC | PRN
  Administered 2017-07-11 – 2017-07-13 (×7): 50 ug via INTRAVENOUS
  Filled 2017-07-11 (×7): qty 2

## 2017-07-11 MED ORDER — SODIUM CHLORIDE 0.9 % IV SOLN
INTRAVENOUS | Status: DC
  Administered 2017-07-11: 1000 mL via INTRAVENOUS
  Administered 2017-07-12 – 2017-07-14 (×3): via INTRAVENOUS

## 2017-07-11 MED ORDER — VITAMIN D3 25 MCG (1000 UNIT) PO TABS
1000.0000 [IU] | ORAL_TABLET | Freq: Every day | ORAL | Status: DC
  Filled 2017-07-11: qty 1

## 2017-07-11 MED ORDER — HYDROCORTISONE NA SUCCINATE PF 100 MG IJ SOLR
100.0000 mg | Freq: Once | INTRAMUSCULAR | Status: AC
  Administered 2017-07-11: 100 mg via INTRAVENOUS
  Filled 2017-07-11: qty 2

## 2017-07-11 MED ORDER — ATORVASTATIN CALCIUM 20 MG PO TABS
20.0000 mg | ORAL_TABLET | Freq: Every day | ORAL | Status: DC
  Filled 2017-07-11: qty 1
  Filled 2017-07-11: qty 2

## 2017-07-11 MED ORDER — PANTOPRAZOLE SODIUM 40 MG PO TBEC
40.0000 mg | DELAYED_RELEASE_TABLET | Freq: Every day | ORAL | Status: DC
  Filled 2017-07-11: qty 1

## 2017-07-11 MED ORDER — AMIODARONE LOAD VIA INFUSION
150.0000 mg | Freq: Once | INTRAVENOUS | Status: AC
  Administered 2017-07-11: 150 mg via INTRAVENOUS
  Filled 2017-07-11: qty 83.34

## 2017-07-11 MED ORDER — GABAPENTIN 100 MG PO CAPS: 200.0000 mg | ORAL_CAPSULE | Freq: Every day | ORAL | Status: DC

## 2017-07-11 MED ORDER — TRAMADOL HCL 50 MG PO TABS: 50.0000 mg | ORAL_TABLET | Freq: Four times a day (QID) | ORAL | Status: DC | PRN

## 2017-07-11 MED ORDER — ALLOPURINOL 100 MG PO TABS
100.0000 mg | ORAL_TABLET | Freq: Every day | ORAL | Status: DC
  Filled 2017-07-11: qty 1

## 2017-07-11 MED ORDER — FENTANYL CITRATE (PF) 100 MCG/2ML IJ SOLN
INTRAMUSCULAR | Status: AC
  Filled 2017-07-11: qty 2

## 2017-07-11 MED ORDER — ORAL CARE MOUTH RINSE
15.0000 mL | Freq: Four times a day (QID) | OROMUCOSAL | Status: DC
  Administered 2017-07-12 – 2017-07-14 (×9): 15 mL via OROMUCOSAL

## 2017-07-11 MED ORDER — LEVOTHYROXINE SODIUM 25 MCG PO TABS
25.0000 ug | ORAL_TABLET | Freq: Every day | ORAL | Status: DC
  Filled 2017-07-11: qty 1

## 2017-07-11 MED ORDER — IPRATROPIUM-ALBUTEROL 0.5-2.5 (3) MG/3ML IN SOLN
3.0000 mL | Freq: Four times a day (QID) | RESPIRATORY_TRACT | Status: DC
  Administered 2017-07-11 – 2017-07-14 (×11): 3 mL via RESPIRATORY_TRACT
  Filled 2017-07-11 (×11): qty 3

## 2017-07-11 MED ORDER — CLOPIDOGREL BISULFATE 75 MG PO TABS: 75.0000 mg | ORAL_TABLET | Freq: Every day | ORAL | Status: DC

## 2017-07-11 MED ORDER — CHLORHEXIDINE GLUCONATE 0.12% ORAL RINSE (MEDLINE KIT): 15.0000 mL | Freq: Two times a day (BID) | OROMUCOSAL | Status: DC

## 2017-07-11 MED ORDER — AMIODARONE HCL IN DEXTROSE 360-4.14 MG/200ML-% IV SOLN
30.0000 mg/h | INTRAVENOUS | Status: DC
  Administered 2017-07-11 – 2017-07-12 (×2): 30 mg/h via INTRAVENOUS
  Filled 2017-07-11: qty 200

## 2017-07-11 MED ORDER — AMIODARONE HCL IN DEXTROSE 360-4.14 MG/200ML-% IV SOLN
60.0000 mg/h | INTRAVENOUS | Status: AC
  Filled 2017-07-11: qty 200

## 2017-07-11 MED ORDER — AMIODARONE HCL IN DEXTROSE 360-4.14 MG/200ML-% IV SOLN
INTRAVENOUS | Status: AC
  Filled 2017-07-11: qty 200

## 2017-07-11 MED ORDER — ORAL CARE MOUTH RINSE: 15.0000 mL | Freq: Four times a day (QID) | OROMUCOSAL | Status: DC

## 2017-07-11 MED ORDER — HEPARIN BOLUS VIA INFUSION
3000.0000 [IU] | Freq: Once | INTRAVENOUS | Status: AC
  Administered 2017-07-11: 3000 [IU] via INTRAVENOUS
  Filled 2017-07-11: qty 3000

## 2017-07-11 MED ORDER — HEPARIN (PORCINE) IN NACL 100-0.45 UNIT/ML-% IJ SOLN
1000.0000 [IU]/h | INTRAMUSCULAR | Status: DC
  Administered 2017-07-11: 1000 [IU]/h via INTRAVENOUS
  Filled 2017-07-11: qty 250

## 2017-07-11 MED ORDER — DEXTROSE 5 % IV SOLN
2.0000 g | Freq: Once | INTRAVENOUS | Status: AC
  Administered 2017-07-11: 2 g via INTRAVENOUS
  Filled 2017-07-11: qty 2

## 2017-07-11 MED ORDER — SACCHAROMYCES BOULARDII 250 MG PO CAPS: 250.0000 mg | ORAL_CAPSULE | Freq: Two times a day (BID) | ORAL | Status: DC

## 2017-07-11 MED ORDER — MIDAZOLAM HCL 2 MG/2ML IJ SOLN
INTRAMUSCULAR | Status: AC
  Filled 2017-07-11: qty 2

## 2017-07-11 MED ORDER — LEVOTHYROXINE SODIUM 25 MCG PO TABS: 25.0000 ug | ORAL_TABLET | Freq: Every day | ORAL | Status: DC

## 2017-07-11 MED ORDER — MIDAZOLAM HCL 2 MG/2ML IJ SOLN
1.0000 mg | INTRAMUSCULAR | Status: DC | PRN
  Administered 2017-07-11 – 2017-07-13 (×9): 1 mg via INTRAVENOUS
  Filled 2017-07-11 (×9): qty 2

## 2017-07-11 MED ORDER — GUAIFENESIN ER 600 MG PO TB12: 1200.0000 mg | ORAL_TABLET | Freq: Two times a day (BID) | ORAL | Status: DC | PRN

## 2017-07-11 MED ORDER — ASPIRIN 81 MG PO CHEW
81.0000 mg | CHEWABLE_TABLET | Freq: Every day | ORAL | Status: DC
  Administered 2017-07-12 – 2017-07-13 (×2): 81 mg
  Filled 2017-07-11 (×2): qty 1

## 2017-07-11 MED ORDER — IOPAMIDOL (ISOVUE-370) INJECTION 76%
INTRAVENOUS | Status: AC
  Administered 2017-07-11: 80 mL via INTRAVENOUS
  Filled 2017-07-11: qty 100

## 2017-07-11 MED ORDER — CLOPIDOGREL BISULFATE 75 MG PO TABS
75.0000 mg | ORAL_TABLET | Freq: Every day | ORAL | Status: DC
  Administered 2017-07-12 – 2017-07-13 (×2): 75 mg
  Filled 2017-07-11 (×2): qty 1

## 2017-07-11 MED ORDER — METHOCARBAMOL 500 MG PO TABS: 500.0000 mg | ORAL_TABLET | Freq: Four times a day (QID) | ORAL | Status: DC | PRN

## 2017-07-11 MED ORDER — ONDANSETRON HCL 4 MG/2ML IJ SOLN
4.0000 mg | Freq: Four times a day (QID) | INTRAMUSCULAR | Status: DC | PRN
Start: 2017-07-11 — End: 2017-07-14
  Administered 2017-07-11: 4 mg via INTRAVENOUS
  Filled 2017-07-11: qty 2

## 2017-07-11 MED ORDER — ASPIRIN 81 MG PO CHEW
81.0000 mg | CHEWABLE_TABLET | Freq: Every day | ORAL | Status: DC
  Filled 2017-07-11: qty 1

## 2017-07-11 MED ORDER — NOREPINEPHRINE BITARTRATE 1 MG/ML IV SOLN
0.0000 ug/min | INTRAVENOUS | Status: DC
  Filled 2017-07-11: qty 4

## 2017-07-11 MED ORDER — NOREPINEPHRINE 4 MG/250ML-% IV SOLN
0.0000 ug/min | INTRAVENOUS | Status: DC
  Administered 2017-07-11: 10 ug/min via INTRAVENOUS
  Administered 2017-07-12: 14 ug/min via INTRAVENOUS
  Administered 2017-07-12: 12 ug/min via INTRAVENOUS
  Administered 2017-07-12: 10 ug/min via INTRAVENOUS
  Administered 2017-07-12: 14 ug/min via INTRAVENOUS
  Administered 2017-07-13 (×2): 16 ug/min via INTRAVENOUS
  Administered 2017-07-13: 10 ug/min via INTRAVENOUS
  Administered 2017-07-13: 14 ug/min via INTRAVENOUS
  Administered 2017-07-14 (×2): 18 ug/min via INTRAVENOUS
  Filled 2017-07-11 (×13): qty 250

## 2017-07-11 MED ORDER — OXYCODONE HCL 5 MG PO TABS: 5.0000 mg | ORAL_TABLET | ORAL | Status: DC | PRN

## 2017-07-11 MED ORDER — ALBUTEROL SULFATE (2.5 MG/3ML) 0.083% IN NEBU: 2.5000 mg | INHALATION_SOLUTION | RESPIRATORY_TRACT | Status: DC | PRN

## 2017-07-11 MED ORDER — SODIUM CHLORIDE 0.9 % IV BOLUS (SEPSIS)
500.0000 mL | Freq: Once | INTRAVENOUS | Status: AC
  Administered 2017-07-11: 500 mL via INTRAVENOUS

## 2017-07-11 MED ORDER — CHLORHEXIDINE GLUCONATE 0.12% ORAL RINSE (MEDLINE KIT)
15.0000 mL | Freq: Two times a day (BID) | OROMUCOSAL | Status: DC
  Administered 2017-07-11 – 2017-07-14 (×6): 15 mL via OROMUCOSAL

## 2017-07-11 MED FILL — Medication: Qty: 1 | Status: AC

## 2017-07-11 NOTE — ED Notes (Addendum)
Patients pleth wave and o2 sat not reliable. Made multiple attempts to try and obtain a consistent pleth wave. O2 sensor placed on patients ear with a warm compress. Patient also placed on non-re breather per EDP.

## 2017-07-11 NOTE — Procedures (Signed)
Procedure: Rt thoracentesis  Indication: Recurrent Rt pleural effusion  Description: Procedure explained to patient.  Consent signed.  Performed in ICU.  Visualized pleural space and effusion using ultrasound.  Prepped and draped Rt midscapular line at 7 th rib space posteriorly.  Instilled 10 ml of 1% lidocaine.  Skin incision made.  Inserted needle into pleural space with clear yellow fluid returned.  Inserted catheter and removed needle.    Withdrew 1600 ml of clear yellow fluid.  No immediate complications.  Post procedure xray pending.  Will send fluid for cell count, glucose, protein, LDH, culture, and cytology.  Coralyn HellingVineet Tamkia Temples, MD [facility] 06/25/2017, 4:27 PM Pager:  [phone] After 3pm call: [phone]

## 2017-07-11 NOTE — Progress Notes (Signed)
CH responded to code in the unit. Pt's family was not present and w/spt of staff, CH called pt's niece (his caregiver for 20 years). She arrived and was taken to conference room to meet w/dr after mtg w/charge nurse in waiting room. Pt's niece Leotis Shames([name]) rec'd an explanation of pt's condition from dr and nurse. She presented as was understanding and accepting. After we were alone, she became tearful and said she had just gone through the same thing with her grandfather and uncle two years ago and a brother (w/n the past 5 years). She said her uncle was very unhappy w/life in general since he lost one leg and toes on the other. She said he always complained and had stopped taking some of his meds in the nursing facility. Although she was appropriately tearful, she was also accepting. She declined the oppty to visit, but said she would be back in the morning. Please page if assistance is needed prior to or during that time.  [phone]   06/29/2017 1900  Clinical Encounter Type  Visited With Family

## 2017-07-11 NOTE — ED Notes (Signed)
Bed: [id number] Expected date:  Expected time:  Means of arrival:  Comments: 76 m sob

## 2017-07-11 NOTE — Progress Notes (Signed)
Spoke with pt's niece.  Only other family is estranged brother.  Explained current situation and sequence of events.  She reports that his quality was very poor.  She does not think he would want to be kept support on life support if no hope of improvement.  She is agreeable to continue current medical therapies.  If he develops cardiac arrest again, then no CPR or defibrillation.  Coralyn HellingVineet Diedra Sinor, MD [facility] 06/29/2017, 7:23 PM Pager:  [phone] After 3pm call: [phone]

## 2017-07-11 NOTE — Progress Notes (Signed)
A fib with RVR.  Will start amiodarone IV.  BP still low.  Will start levophed.  If he needs higher doses, then will need CVL.  Will start heparin gtt per pharmacy for a fib.  Will need to wait 6 hours after Rt thoracentesis to start heparin gtt >> plan to start on 06/29/2017 at 10 pm.  Coralyn HellingVineet Zorion Nims, MD [facility] 06/22/2017, 5:13 PM Pager:  [phone] After 3pm call: [phone]

## 2017-07-11 NOTE — Progress Notes (Signed)
Had respiratory leading to cardiac arrest.  Initial rhythm was PEA, then VF and VT.  Received CPR, defibrillation, epi, and magnesium.  Already received amiodarone bolus for a fib.  Had ROSC.  Intubated by CRNA.  Rhythm after resuscitation was a fib.  Remained hypotensive and on levophed.  CC time independent of procedure time 37 minutes.  Coralyn HellingVineet Neria Procter, MD [facility] 07/02/2017, 6:56 PM Pager:  [phone] After 3pm call: [phone]

## 2017-07-11 NOTE — ED Notes (Signed)
Patient has a instance of incontinence. Patient was cleaned and instructed to use urinal that was left at bedside.

## 2017-07-11 NOTE — Progress Notes (Signed)
A consult was received from an ED physician for Cefepime and Vancomycin per pharmacy dosing for suspected PNA.  The patient's profile has been reviewed for ht/wt/allergies/indication/available labs.   A one time order has been placed for Cefepime 2gm IV & Vancomcyin 1gm IV.  Further antibiotics/pharmacy consults should be ordered by admitting physician if indicated.                       Thank you,  Otho BellowsGreen, Anela Bensman L 06-25-17  7:35 AM

## 2017-07-11 NOTE — Procedures (Signed)
Central Venous Catheter Insertion Procedure Note Ashley MarinerJerald J Siracusa [id number] [birth year]  Procedure: Insertion of Central Venous Catheter Indications: Drug and/or fluid administration and Frequent blood sampling  Procedure Details Consent: Unable to obtain consent because of emergent medical necessity. Time Out: Verified patient identification, verified procedure, site/side was marked, verified correct patient position, special equipment/implants available, medications/allergies/relevent history reviewed, required imaging and test results available.  Performed  Maximum sterile technique was used including antiseptics, cap, gloves, gown, hand hygiene, mask and sheet. Skin prep: Chlorhexidine; local anesthetic administered A antimicrobial bonded/coated triple lumen catheter was placed in the left internal jugular vein using the Seldinger technique.  Evaluation Blood flow good Complications: No apparent complications Patient did tolerate procedure well. Chest X-ray ordered to verify placement.  CXR: pending.  Coralyn HellingVineet Zakar Brosch, MD [facility] 2017-02-15, 6:53 PM Pager:  [phone] After 7pm call: [phone]

## 2017-07-11 NOTE — Progress Notes (Signed)
  Amiodarone Drug - Drug Interaction Consult Note  Recommendations:  Continue current regimen  As noted below, amiodarone can increase risk of myopathy with atorvastatin - monitor for symptoms  Amiodarone can theoretically inhibit conversion of Plavix to its active metabolite, reducing efficacy. Patient is also on ASA. Will need to weigh potential risks of this theoretical interaction vs benefits of amiodarone if continuing long term.  Amiodarone is metabolized by the cytochrome P450 system and therefore has the potential to cause many drug interactions. Amiodarone has an average plasma half-life of 50 days (range 20 to 100 days).   There is potential for drug interactions to occur several weeks or months after stopping treatment and the onset of drug interactions may be slow after initiating amiodarone.   [x]  Statins: Increased risk of myopathy. Simvastatin- restrict dose to 20mg  daily. Other statins: counsel patients to report any muscle pain or weakness immediately.  []  Anticoagulants: Amiodarone can increase anticoagulant effect. Consider warfarin dose reduction. Patients should be monitored closely and the dose of anticoagulant altered accordingly, remembering that amiodarone levels take several weeks to stabilize.  []  Antiepileptics: Amiodarone can increase plasma concentration of phenytoin, the dose should be reduced. Note that small changes in phenytoin dose can result in large changes in levels. Monitor patient and counsel on signs of toxicity.  []  Beta blockers: increased risk of bradycardia, AV block and myocardial depression. Sotalol - avoid concomitant use.  []   Calcium channel blockers (diltiazem and verapamil): increased risk of bradycardia, AV block and myocardial depression.  []   Cyclosporine: Amiodarone increases levels of cyclosporine. Reduced dose of cyclosporine is recommended.  []  Digoxin dose should be halved when amiodarone is started.  []  Diuretics: increased  risk of cardiotoxicity if hypokalemia occurs.  []  Oral hypoglycemic agents (glyburide, glipizide, glimepiride): increased risk of hypoglycemia. Patient's glucose levels should be monitored closely when initiating amiodarone therapy.   []  Drugs that prolong the QT interval:  Torsades de pointes risk may be increased with concurrent use - avoid if possible.  Monitor QTc, also keep magnesium/potassium WNL if concurrent therapy can't be avoided. Marland Kitchen. Antibiotics: e.g. fluoroquinolones, erythromycin. . Antiarrhythmics: e.g. quinidine, procainamide, disopyramide, sotalol. . Antipsychotics: e.g. phenothiazines, haloperidol.  . Lithium, tricyclic antidepressants, and methadone. Thank You,   Bernadene Personrew Avin Gibbons, PharmD, BCPS Pager: [phone] 2016/08/16, 8:22 PM

## 2017-07-11 NOTE — ED Triage Notes (Signed)
[facility] shortness of breath x 1 day no fever with lower blood pressure than usual alert and oriented able to speak in full sentences.

## 2017-07-11 NOTE — H&P (Addendum)
[facility] HISTORY AND PHYSICAL NOTE  Date of Admission: 2016/08/26 Referring Provider: Dr. Elise BenneNanivati, ER Chief Complaint: Short of breath  HPI: 76 yo male from NH with shortness of breath.  He says this was sudden in onset.  He has not been having cough, wheeze, sputum, fever, chest pain, headache, sinus congestion, nausea, abdominal pain, or diarrhea.  He is followed by Dr. Isaiah SergeMannam in pulmonary office for COPD on home oxygen, and recurrent right pleural effusion.  He had effusion last tapped about 1 month ago.  CT chest shows increased effusion.  He was noted to have low BP in ER.  He was also noted to A fib on ECG.  He has hx of CAD s/p stent in 04/05/17, combined CHF, and valvular heart disease.  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  Past Medical History: He  has a past medical history of Acute on chronic combined systolic (congestive) and diastolic (congestive) heart failure (HCC) (04/2017), Adult failure to thrive, Anemia, Arthritis, CAD (coronary artery disease), CKD (chronic kidney disease), stage III (HCC), COPD (chronic obstructive pulmonary disease) (HCC), Critical lower limb ischemia, Dehiscence of amputation stump (HCC), Depression, Gout, Hypothyroidism, PAD (peripheral artery disease) (HCC), Physical deconditioning, Pyelonephritis (06/23/2015), and Status post foot surgery.  Past Surgical History: He  has a past surgical history that includes Knee cartilage surgery (Left, 1960's); Amputation (Right, 03/01/2014); Amputation (Right, 04/26/2014); lower extremity angiogram (N/A, 05/27/2014); Knee ligament reconstruction (Left, 1960's); Cataract extraction w/ intraocular lens  implant, bilateral (Bilateral); Ankle arthroscopy (Right, 08/13/2015); Amputation (Right, 10/01/2015); Stump revision (Right, 12/05/2015); ABDOMINAL AORTOGRAM W/LOWER EXTREMITY (N/A, 09/03/2016); Lower Extremity Angiography  (Left, 09/15/2016); PERIPHERAL VASCULAR INTERVENTION (Left, 09/15/2016); Amputation (Left, 10/08/2016); PERIPHERAL VASCULAR INTERVENTION (11/10/2016); Refractive surgery (Bilateral); Inguinal hernia repair (Left); ABDOMINAL AORTOGRAM W/LOWER EXTREMITY (N/A, 11/10/2016); PERIPHERAL VASCULAR INTERVENTION (11/10/2016); PERIPHERAL VASCULAR ATHERECTOMY (11/10/2016); Stump revision (Left, 11/19/2016); Amputation (Left, 07/23/2016); IR THORACENTESIS ASP PLEURAL SPACE W/IMG GUIDE (03/18/2017); RIGHT/LEFT HEART CATH AND CORONARY ANGIOGRAPHY (N/A, 04/04/2017); CORONARY ATHERECTOMY (N/A, 04/05/2017); and CORONARY STENT INTERVENTION (N/A, 04/05/2017).  Family History: His family history includes Heart attack in his father; Heart failure in his brother and father.  Social History: He  reports that he quit smoking about 15 months ago. His smoking use included cigarettes. He has a 57.00 pack-year smoking history. he has never used smokeless tobacco. He reports that he does not drink alcohol or use drugs.  ROS: Negative except above  Subjective:  Vital signs: BP (!) 80/66 (BP Location: Left Arm)   Pulse (!) 111   Temp (!) 97.5 F (36.4 C) (Rectal)   Resp (!) 22   Ht 5\' 10"  (1.778 m)   Wt 175 lb (79.4 kg)   SpO2 97%   BMI 25.11 kg/m   Intake/Output: No intake/output data recorded.  Physical Exam:  General - cachectic Eyes - pupils reactive ENT - no sinus tenderness, no oral exudate, no LAN Cardiac - irregular, no murmur Chest - decreased BS on Rt base Abd - soft, non tender Ext - Rt BKA, Lt TMA Skin - sacral wound Neuro - normal strength Psych - normal mood  Discussion: 76 yo male with acute on chronic respiratory failure with recurrent Rt pleural effusion, A fib with RVR, interstitial edema, and compressive ATX.  Less concern for infections process.  Assessment/Plan:  Acute on chronic respiratory failure with hypoxia. - likely from progressive Rt pleural effusion and interstitial edema - oxygen to  keep SpO2 88 to 95% - Bipap prn - f/u CXR - will set up for thoracentesis  COPD with emphysema. - scheduled BDs  New onset A fib. Hypotension >> no evidence for infection. Chronic combined CHF. CAD s/p stenting September 2018. Hx of Valvular heart disease, PAD, HLD. - continue ASA, lipitor - hold plavix until after thoracentesis - hold coreg, lasix, lisinopril  Peripheral neuropathy. - continue neurontin, robaxin, oxycodone, ultram  Hx of Gout, Hypothyroidism. - allopurinol, synthroid  Splenomegaly with hx of ETOH. - check abdominal u/s  Moderate protein calorie malnutrition. - advance diet as able once respiratory status improved  CKD 3. - monitor urine outpt  DVT prophylaxis: heparin gtt >> start after thoracentesis SUP: Protonix Diet: Clear liquid diet Goals of care: Full code  Coralyn Helling, MD [facility] 06/28/2017, 1:56 PM Pager:  [phone] After 7pm call: [phone]  FLOW SHEET  Cultures: Blood 12/24 >> Urine 12/24 >>  Antibiotics: Vancomycin 12/24 >> Cefepime 12/24 >>  Studies: Echo 03/31/17 >> EF 30 to 35%, grade 2 DD, mild AS, mod MR, PAS 57 mmHg CT angio chest 12/24 >> Large Rt effusion, calcified pleural plaques, compressive ATX, interlobular septal thickening, centrilobular and paraseptal emphysema, 4 mm nodule LLL, marked splenomegaly  Events: 12/24 Admit  Lines/tubes:  Consults:  Resolved Problems:  Labs: CMP Latest Ref Rng & Units 07/08/2017 04/26/2017 04/22/2017  Glucose 65 - 99 mg/dL 81 71 76  BUN 6 - 20 mg/dL 47(W) 24 29(F)  Creatinine 0.61 - 1.24 mg/dL 6.21(H) 0.86 5.78  Sodium 135 - 145 mmol/L 136 143 135  Potassium 3.5 - 5.1 mmol/L 4.8 5.2 4.0  Chloride 101 - 111 mmol/L 107 102 100(L)  CO2 22 - 32 mmol/L 21(L) 24 28  Calcium 8.9 - 10.3 mg/dL 8.2(L) 8.9 8.3(L)  Total Protein 6.5 - 8.1 g/dL 4.6(N) - -  Total Bilirubin 0.3 - 1.2 mg/dL 0.9 - -  Alkaline Phos 38 - 126 U/L 63 - -  AST 15 - 41 U/L 11(L) - -   ALT 17 - 63 U/L 8(L) - -    CBC Latest Ref Rng & Units 06/26/2017 04/20/2017 04/05/2017  WBC 4.0 - 10.5 K/uL 15.2(H) 9.1 9.8  Hemoglobin 13.0 - 17.0 g/dL 6.2(X) 5.2(W) 4.1(L)  Hematocrit 39.0 - 52.0 % 30.2(L) 31.8(L) 31.3(L)  Platelets 150 - 400 K/uL 151 467(H) 442(H)    CBG (last 3)  No results for input(s): GLUCAP in the last 72 hours.  Imaging: Dg Chest 2 View  Result Date: 07/15/2017 CLINICAL DATA:  Shortness of Breath EXAM: CHEST  2 VIEW COMPARISON:  April 22, 2017 FINDINGS: There is persistent airspace consolidation throughout the right mid and lower lung zones with small right pleural effusion. There is a skin fold on the left without evident pneumothorax. There is no edema or consolidation on the left. Heart is upper normal in size with pulmonary vascularity showing evidence of a degree of pulmonary venous hypertension. No adenopathy. There is aortic atherosclerosis. No bone lesions. IMPRESSION: Persistent airspace consolidation throughout the right mid and lower lung zones with small right pleural effusion. Underlying pulmonary vascular congestion. There is aortic atherosclerosis. Aortic Atherosclerosis (ICD10-I70.0). Electronically Signed   By: Bretta Bang III M.D.   On: 06/20/2017 07:44   Ct Angio Chest Pe W And/or Wo Contrast  Result Date: 07/15/2017 CLINICAL DATA:  76 year old male with shortness of breath EXAM: CT ANGIOGRAPHY CHEST WITH CONTRAST TECHNIQUE: Multidetector CT imaging of the chest was performed using the standard protocol during  bolus administration of intravenous contrast. Multiplanar CT image reconstructions and MIPs were obtained to evaluate the vascular anatomy. CONTRAST:  80mL ISOVUE-370 IOPAMIDOL (ISOVUE-370) INJECTION 76% COMPARISON:  Most recent prior CT scan of the chest 03/17/2017 FINDINGS: Cardiovascular: No evidence of pulmonary embolus. Normal caliber thoracic aorta. No aneurysm. Extensive coronary artery calcifications are present. The right  heart, particularly the right atrium are enlarged. No pericardial effusion. Mediastinum/Nodes: Unremarkable CT appearance of the thyroid gland. No suspicious mediastinal or hilar adenopathy. No soft tissue mediastinal mass. The thoracic esophagus is unremarkable. Lungs/Pleura: Large right-sided pleural effusion with mildly convex margins. Calcified pleural plaques are present along the posteromedial aspect of the pleural space. The volume of the pleural effusion results in significant atelectasis of the right lower lobe. There is diffuse mild interlobular septal thickening in the bilateral upper lungs suggesting early interstitial edema. This is superimposed on a background of combined centrilobular and paraseptal pulmonary emphysema. A 4 mm pulmonary nodule is present in the left lower lobe (image 105 series 13). This is unchanged compared to the prior chest CT from 03/17/2017. No additional pulmonary nodules are identified. Upper Abdomen: Marked splenomegaly again noted. There is trace perihepatic ascites. Musculoskeletal: No acute fracture identified. 8 mm circumscribed lucency present within the inferior and anterior aspect of the T4 vertebral body. Stable T7 and T8 endplate deformities. Review of the MIP images confirms the above findings. IMPRESSION: 1. Progressive right-sided pleural effusion with associated progressive right lower lobe atelectasis. 2. 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 3. Stable nonspecific 8 mm lucency in the T4 vertebral body. While this may be degenerative in nature, a true lytic lesion is difficult to exclude radiographically. Does the patient have a known history of malignancy or myeloma? 4. Coronary artery calcifications. 5. Combined centrilobular and paraseptal pulmonary emphysema. 6. Coronary artery calcifications. 7. Solitary 4-5 mm left lower lobe pulmonary nodule. No follow-up needed if patient is low-risk.  Non-contrast chest CT can be considered in 12 months if patient is high-risk. This recommendation follows the consensus statement: Guidelines for Management of Incidental Pulmonary Nodules Detected on CT Images: From the Fleischner Society 2017; Radiology 2017; 284:228-243. Aortic Atherosclerosis (ICD10-I70.0) and Emphysema (ICD10-J43.9). Electronically Signed   By: Malachy MoanHeath  McCullough M.D.   On: 06/30/2017 10:48

## 2017-07-11 NOTE — Progress Notes (Addendum)
[place] for IV heparin Indication: atrial fibrillation  No Known Allergies  Patient Measurements: Height: _0  (177.8 cm) Weight: 155 lb 13.8 oz (70.7 kg) IBW/kg (Calculated) : 73 Heparin Dosing Weight: TBW  Vital Signs: Temp: 97.6 F (36.4 C) (12/24 1530) Temp Source: Oral (12/24 1530) BP: 85/65 (12/24 1900) Pulse Rate: 81 (12/24 1900)  Labs: Recent Labs    06/26/2017 0740 06/24/2017 0828  HGB 9.3*  --   HCT 30.2*  --   PLT 151  --   APTT 31  --   LABPROT 15.4*  --   INR 1.23  --   CREATININE  --  1.43*  TROPONINI  --  <0.03    Estimated Creatinine Clearance: 44.6 mL/min (A) (by C-G formula based on SCr of 1.43 mg/dL (H)).   Medical History: Past Medical History:  Diagnosis Date  . Acute on chronic combined systolic (congestive) and diastolic (congestive) heart failure ([place]) 04/2017  . Adult failure to thrive    /notes 06/23/2015  . Anemia   . Arthritis    "hands, fingers" (11/10/2016  . CAD (coronary artery disease)    a. 03/2017: multivessel CAD and not felt to be a CABG candidate, s/p DESx2 to LCx and OM1.   . CKD (chronic kidney disease), stage III ([place])   . COPD (chronic obstructive pulmonary disease) (HCC)    LONG TIME SMOKER  . Critical lower limb ischemia   . 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 Depression    "periods of depression" (06/23/2015)  . Gout   . Hypothyroidism   . PAD (peripheral artery disease) ([place])   . Physical deconditioning   . Pyelonephritis 06/23/2015  . Status post foot surgery    right fifth toe amputation by Dr. Sharol Given     Medications:  Medications Prior to Admission  Medication Sig Dispense Refill Last Dose  . albuterol (PROVENTIL HFA;VENTOLIN HFA) 108 (90 BASE) MCG/ACT inhaler Inhale 2 puffs into the lungs 2 (two) times daily. 0900 & 1700   07/10/2017 at Unknown time  . allopurinol (ZYLOPRIM) 100 MG tablet Take 100 mg by mouth daily.    07/10/2017 at  Unknown time  . aspirin 81 MG chewable tablet Chew 81 mg by mouth daily.   07/10/2017 at Unknown time  . atorvastatin (LIPITOR) 20 MG tablet Take 20 mg by mouth daily at 6 PM.   07/10/2017 at Unknown time  . carvedilol (COREG) 3.125 MG tablet Take 1 tablet (3.125 mg total) by mouth 2 (two) times daily. 60 tablet 0 07/10/2017 at 2000  . cholecalciferol (VITAMIN D) 1000 units tablet Take 1,000 Units by mouth daily. (0900)   07/10/2017 at Unknown time  . clopidogrel (PLAVIX) 75 MG tablet Take 1 tablet (75 mg total) by mouth daily with breakfast. 30 tablet 6 07/10/2017 at Unknown time  . ferrous sulfate 325 (65 FE) MG tablet Take 1 tablet (325 mg total) by mouth 2 (two) times daily with a meal. 60 tablet 0 07/10/2017 at Unknown time  . furosemide (LASIX) 40 MG tablet Take 1 tablet (40 mg total) by mouth 2 (two) times daily. (Patient taking differently: Take 40 mg by mouth daily. ) 30 tablet  07/10/2017 at Unknown time  . gabapentin (NEURONTIN) 100 MG capsule Take 200 mg by mouth at bedtime.    07/10/2017 at Unknown time  . guaiFENesin (MUCINEX) 600 MG 12 hr tablet Take 1,200 mg by mouth 2 (two) times daily  as needed for cough.   unknown  . ipratropium-albuterol (DUONEB) 0.5-2.5 (3) MG/3ML SOLN Take 3 mLs by nebulization 4 (four) times daily as needed (for shortness of breath).   06/25/2017 at Unknown time  . levothyroxine (SYNTHROID, LEVOTHROID) 25 MCG tablet Take 1 tablet (25 mcg total) by mouth daily at 6 (six) AM.   07/10/2017 at Unknown time  . lisinopril (PRINIVIL,ZESTRIL) 5 MG tablet Take 1 tablet (5 mg total) by mouth daily. 30 tablet 0 07/10/2017 at Unknown time  . meloxicam (MOBIC) 7.5 MG tablet Take 7.5 mg by mouth daily.    07/10/2017 at Unknown time  . methocarbamol (ROBAXIN) 500 MG tablet Take 500 mg by mouth every 6 (six) hours as needed for muscle spasms.    unknown  . Multiple Vitamins-Minerals (MULTIVITAMIN WITH MINERALS) tablet Take 1 tablet by mouth daily. (0900)   07/10/2017 at Unknown  time  . omeprazole (PRILOSEC) 20 MG capsule Take 20 mg by mouth daily at 6 (six) AM.    07/10/2017 at Unknown time  . oxyCODONE (OXY IR/ROXICODONE) 5 MG immediate release tablet Take 1-2 tablets (5-10 mg total) by mouth every 4 (four) hours as needed (73m for moderate pain, 120mfor severe pain). 10 tablet 0 unknown  . potassium chloride SA (K-DUR,KLOR-CON) 20 MEQ tablet Take 1 tablet (20 mEq total) by mouth 2 (two) times daily. (Patient taking differently: Take 20 mEq by mouth daily. )   07/10/2017 at Unknown time  . saccharomyces boulardii (FLORASTOR) 250 MG capsule Take 250 mg by mouth 2 (two) times daily.   07/10/2017 at Unknown time  . traMADol (ULTRAM) 50 MG tablet Take 1 tablet (50 mg total) by mouth every 6 (six) hours as needed for moderate pain. 20 tablet 0 unknown  . traZODone (DESYREL) 50 MG tablet Take 50 mg by mouth at bedtime.    07/10/2017 at Unknown time   Scheduled:  . [START ON 07/12/2017] aspirin  81 mg Per Tube Daily  . [START ON 07/12/2017] atorvastatin  20 mg Per Tube q1800  . chlorhexidine gluconate (MEDLINE KIT)  15 mL Mouth Rinse BID  . [START ON 07/12/2017] clopidogrel  75 mg Per Tube Q breakfast  . ipratropium-albuterol  3 mL Nebulization Q6H  . [START ON 07/12/2017] levothyroxine  25 mcg Per Tube QAC breakfast  . [START ON 07/12/2017] mouth rinse  15 mL Mouth Rinse QID  . pantoprazole sodium  40 mg Per Tube Q24H   Infusions:  . sodium chloride 1,000 mL (06/28/2017 1600)  . amiodarone 60 mg/hr (07/12/2017 1738)   Followed by  . amiodarone    . norepinephrine 20 mcg/min (07/10/2017 1915)    Assessment: [age]oM from NH, with PMH COPD, combined CHF, CAD s/p stenting this September, CKD, R BKA, hypothyroid, EtOH abuse, valvular Dz and questionable hepatic dz. presenting with SOB and worsening pleural effusion requiring R thoracentesis. Afib noted in ER, and pharmacy consulted to dose IV heparin starting 6 hrs after thoracentesis   Baseline INR, aPTT: WNL  Prior  anticoagulation: none, on ASA/Plavix PTA for recent stenting  Significant events:  12/24: Thoracentesis performed approx 4pm  12/24: Patient suffered cardiac arrest ~6pm with ROSC achieved; proceed with heparin per CCM  Today, 06/21/2017:  CBC: Hgb low on admission (anemic at baseline); Plt borderline low  No bleeding or infusion issues per nursing  CrCl: 45 ml/min; suspect will drop after cardiac arrest  Goal of Therapy: Heparin level 0.3-0.7 units/ml Monitor platelets by anticoagulation protocol: Yes  Plan: At 2200  tonight:  Heparin 3000 units IV bolus x 1  Heparin 1000 units/hr IV infusion  Check heparin level 8 hrs after start   Daily CBC, daily heparin level once stable  Monitor for signs of bleeding (especially with possible broken ribs or other chest trauma during CPR) or thrombosis   Reuel Boom, PharmD Pager: [phone] 07/18/2017, 7:32 PM

## 2017-07-11 NOTE — ED Provider Notes (Signed)
6:15 PM  Cardiopulmonary Resuscitation (CPR) Procedure Note Directed/Performed by: Canary Brimhristopher J Lavra Imler I personally directed ancillary staff and/or performed CPR in an effort to regain return of spontaneous circulation and to maintain cardiac, neuro and systemic perfusion.    This examiner responded to a CODE BLUE overhead page in the hospital.  Upon getting to the patient's room, patient was already getting chest compressions and ACLS.  Patient had received 1 dose of epinephrine prior to arrival.  Patient was in the process of getting intubated by the CRNA in the ICU.  Intubation appear to have occurred without difficulty and breath sounds are equal bilaterally.  Patient did not yet have chest x-ray to confirm placement.  On pulse check, patient found to be in PEA.  More epinephrine was given and patient continued CPR.  Next  On subsequent pulse check, patient was found to have ventricular fibrillation.  Defibrillation of 120 J was performed with return to V. fib.  Patient continued compressions and more epinephrine.  Next  On subsequent pulse recheck, patient continued to be in a wide or V. fib.  Another shock at 150 J was performed with improvement into PEA arrest.  Due to possibility of alcohol use by nursing report, magnesium was provided if it was torsades.   After mag given, the ICU attending arrived to the bedside.  Care assumed by the ICU team for further code management.      Sidnie Swalley, Canary Brimhristopher J, MD 07/12/17 [phone]

## 2017-07-11 NOTE — ED Provider Notes (Signed)
[facility]-ICU/STEPDOWN Provider Note   CSN: [id number] Arrival date & time: 06/22/2017  4098     History   Chief Complaint Chief Complaint  Patient presents with  . Shortness of Breath    HPI Caleb Hays is a 76 y.o. male.  HPI  76 year old male with history of COPD on 2 L of oxygen at home, CKD, hypothyroidism, recurrent right pleural effusion, combined systolic and diastolic CHF, coronary artery disease, history of peripheral artery disease with right BKA who presents with shortness of breath.  Patient reports that he has been having shortness of breath over the last year, has had pleural effusions that has required intermittent drainage, and believes that is what caused it is causing his shortness of breath today.  Reports that he has chronic dyspnea, however it has been worsening since yesterday.  Denies cough, fever, lightheadedness, palpitations, chest pain.  Denies vomiting, black or bloody stools.  Was sent to the emergency department given increased oxygen requirement as well as low blood pressures at his facility.   Past Medical History:  Diagnosis Date  . Acute on chronic combined systolic (congestive) and diastolic (congestive) heart failure (HCC) 04/2017  . Adult failure to thrive    /notes 06/23/2015  . Anemia   . Arthritis    "hands, fingers" (11/10/2016  . CAD (coronary artery disease)    a. 03/2017: multivessel CAD and not felt to be a CABG candidate, s/p DESx2 to LCx and OM1.   . CKD (chronic kidney disease), stage III (HCC)   . COPD (chronic obstructive pulmonary disease) (HCC)    LONG TIME SMOKER  . Critical lower limb ischemia   . 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 Depression    "periods of depression" (06/23/2015)  . Gout   . Hypothyroidism   . PAD (peripheral artery disease) (HCC)   . Physical deconditioning   . Pyelonephritis 06/23/2015  . Status post foot surgery    right fifth toe  amputation by Dr. Lajoyce Corners     Patient Active Problem List   Diagnosis Date Noted  . Acute on chronic respiratory failure (HCC) 06/26/2017  . Ischemic cardiomyopathy 04/26/2017  . Hyperlipidemia LDL goal <70 04/26/2017  . Mitral regurgitation 04/26/2017  . CHF (congestive heart failure) (HCC) 04/20/2017  . Coronary artery disease involving native coronary artery of native heart with unstable angina pectoris (HCC)   . Acute on chronic combined systolic and diastolic CHF (congestive heart failure) (HCC)   . Dilated cardiomyopathy (HCC)   . Elevated troponin 03/30/2017  . Chronic respiratory failure with hypoxia (HCC) 03/30/2017  . HCAP (healthcare-associated pneumonia) 03/17/2017  . Recurrent right pleural effusion 03/17/2017  . Chronic pain 03/17/2017  . S/P transmetatarsal amputation of foot, left (HCC) 10/08/2016  . PAD (peripheral artery disease) (HCC) 09/15/2016  . Edema 05/05/2016  . Phantom pain (HCC) 03/08/2016  . Gout 03/08/2016  . Hypothyroidism 03/08/2016  . CKD (chronic kidney disease) stage 3, GFR 30-59 ml/min (HCC) 03/08/2016  . GERD without esophagitis 03/08/2016  . Constipation 12/29/2015  . Below knee amputation status (HCC) 10/01/2015  . Ankle pain 08/13/2015  . Adult failure to thrive   . PVD (peripheral vascular disease) (HCC)   . Elevated blood uric acid level 08/10/2015  . 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 Anemia of chronic disease 08/10/2015  . Malnutrition of moderate degree 06/24/2015  . Failure to thrive in adult 06/23/2015  . Weight loss 06/23/2015  .  Tobacco abuse 06/23/2015  . COPD (chronic obstructive pulmonary disease) (HCC) 06/23/2015  . Fall   . Critical lower limb ischemia 05/31/2014  . Status post right foot surgery 03/12/2014    Past Surgical History:  Procedure Laterality Date  . ABDOMINAL AORTOGRAM W/LOWER EXTREMITY N/A 09/03/2016   Procedure: Abdominal Aortogram w/Lower Extremity;  Surgeon: Sherren Kerns, MD;  Location: [facility]  INVASIVE CV LAB;  Service: Cardiovascular;  Laterality: N/A;  Lt. leg  . ABDOMINAL AORTOGRAM W/LOWER EXTREMITY N/A 11/10/2016   Procedure: Abdominal Aortogram w/Lower Extremity;  Surgeon: Maeola Harman, MD;  Location: [facility] INVASIVE CV LAB;  Service: Cardiovascular;  Laterality: N/A;  . AMPUTATION Right 03/01/2014   Procedure: AMPUTATION RAY;  Surgeon: Nadara Mustard, MD;  Location: [facility] OR;  Service: Orthopedics;  Laterality: Right;  Right Foot 5th Ray Amputation  . AMPUTATION Right 04/26/2014   Procedure: Right Foot 4th Ray Amputation;  Surgeon: Nadara Mustard, MD;  Location: [facility] OR;  Service: Orthopedics;  Laterality: Right;  . AMPUTATION Right 10/01/2015   Procedure: Right Below Knee Amputation;  Surgeon: Nadara Mustard, MD;  Location: [facility] OR;  Service: Orthopedics;  Laterality: Right;  . AMPUTATION Left 10/08/2016   Procedure: Left Transmetatarsal Amputation;  Surgeon: Nadara Mustard, MD;  Location: [facility] OR;  Service: Orthopedics;  Laterality: Left;  . AMPUTATION Left 07/23/2016   Procedure: Left 2nd Toe Amputation;  Surgeon: Nadara Mustard, MD;  Location: [facility] OR;  Service: Orthopedics;  Laterality: Left;  . ANKLE ARTHROSCOPY Right 08/13/2015   Procedure: ANKLE ARTHROSCOPY;  Surgeon: Nadara Mustard, MD;  Location: [facility] OR;  Service: Orthopedics;  Laterality: Right;  . CATARACT EXTRACTION W/ INTRAOCULAR LENS  IMPLANT, BILATERAL Bilateral   . CORONARY ATHERECTOMY N/A 04/05/2017   Procedure: CORONARY ATHERECTOMY;  Surgeon: Marykay Lex, MD;  Location: [facility] INVASIVE CV LAB;  Service: Cardiovascular;  Laterality: N/A;  . CORONARY STENT INTERVENTION N/A 04/05/2017   Procedure: CORONARY STENT INTERVENTION;  Surgeon: Marykay Lex, MD;  Location: [facility] INVASIVE CV LAB;  Service: Cardiovascular;  Laterality: N/A;  . INGUINAL HERNIA REPAIR Left   . IR THORACENTESIS ASP PLEURAL SPACE W/IMG GUIDE  03/18/2017  . KNEE CARTILAGE SURGERY Left 1960's   football injury  . KNEE LIGAMENT RECONSTRUCTION Left 1960's  .  LOWER EXTREMITY ANGIOGRAM N/A 05/27/2014   Procedure: LOWER EXTREMITY ANGIOGRAM;  Surgeon: Runell Gess, MD;  Location: [facility] CATH LAB;  Service: Cardiovascular;  Laterality: N/A;  . LOWER EXTREMITY ANGIOGRAPHY Left 09/15/2016   Procedure: Lower Extremity Angiography;  Surgeon: Maeola Harman, MD;  Location: [facility] INVASIVE CV LAB;  Service: Cardiovascular;  Laterality: Left;  . PERIPHERAL VASCULAR ATHERECTOMY  11/10/2016   Procedure: Peripheral Vascular Atherectomy;  Surgeon: Maeola Harman, MD;  Location: [facility] INVASIVE CV LAB;  Service: Cardiovascular;;  . PERIPHERAL VASCULAR INTERVENTION Left 09/15/2016   Procedure: Peripheral Vascular Intervention;  Surgeon: Maeola Harman, MD;  Location: [facility] INVASIVE CV LAB;  Service: Cardiovascular;  Laterality: Left;  SFA/popiteal  . PERIPHERAL VASCULAR INTERVENTION  11/10/2016   Hattie Perch 11/10/2016  . PERIPHERAL VASCULAR INTERVENTION  11/10/2016   Procedure: Peripheral Vascular Intervention;  Surgeon: Maeola Harman, MD;  Location: [facility] INVASIVE CV LAB;  Service: Cardiovascular;;  . REFRACTIVE SURGERY Bilateral   . RIGHT/LEFT HEART CATH AND CORONARY ANGIOGRAPHY N/A 04/04/2017   Procedure: RIGHT/LEFT HEART CATH AND CORONARY ANGIOGRAPHY;  Surgeon: Marykay Lex, MD;  Location: [facility] INVASIVE CV LAB;  Service: Cardiovascular;  Laterality: N/A;  . STUMP REVISION Right  12/05/2015   Procedure: Revision Right Below Knee Amputation;  Surgeon: Nadara Mustard, MD;  Location: [facility] OR;  Service: Orthopedics;  Laterality: Right;  . STUMP REVISION Left 11/19/2016   Procedure: Revision Left Transmetatarsal Amputation;  Surgeon: Nadara Mustard, MD;  Location: [facility] OR;  Service: Orthopedics;  Laterality: Left;       Home Medications    Prior to Admission medications   Medication Sig Start Date End Date Taking? Authorizing Provider  albuterol (PROVENTIL HFA;VENTOLIN HFA) 108 (90 BASE) MCG/ACT inhaler Inhale 2 puffs into the lungs 2 (two) times daily.  0900 & 1700   Yes [provider]  allopurinol (ZYLOPRIM) 100 MG tablet Take 100 mg by mouth daily.    Yes [provider]  aspirin 81 MG chewable tablet Chew 81 mg by mouth daily.   Yes [provider]  atorvastatin (LIPITOR) 20 MG tablet Take 20 mg by mouth daily at 6 PM.   Yes [provider]  carvedilol (COREG) 3.125 MG tablet Take 1 tablet (3.125 mg total) by mouth 2 (two) times daily. 04/06/17  Yes Mikhail, [place], DO  cholecalciferol (VITAMIN D) 1000 units tablet Take 1,000 Units by mouth daily. (0900)   Yes [provider]  clopidogrel (PLAVIX) 75 MG tablet Take 1 tablet (75 mg total) by mouth daily with breakfast. 09/16/16  Yes Trinh, Kimberly A, PA-C  ferrous sulfate 325 (65 FE) MG tablet Take 1 tablet (325 mg total) by mouth 2 (two) times daily with a meal. 04/06/17  Yes Mikhail, Pensions consultant, DO  furosemide (LASIX) 40 MG tablet Take 1 tablet (40 mg total) by mouth 2 (two) times daily. Patient taking differently: Take 40 mg by mouth daily.  04/22/17  Yes Gherghe, Daylene Katayama, MD  gabapentin (NEURONTIN) 100 MG capsule Take 200 mg by mouth at bedtime.    Yes [provider]  guaiFENesin (MUCINEX) 600 MG 12 hr tablet Take 1,200 mg by mouth 2 (two) times daily as needed for cough.   Yes [provider]  ipratropium-albuterol (DUONEB) 0.5-2.5 (3) MG/3ML SOLN Take 3 mLs by nebulization 4 (four) times daily as needed (for shortness of breath).   Yes [provider]  levothyroxine (SYNTHROID, LEVOTHROID) 25 MCG tablet Take 1 tablet (25 mcg total) by mouth daily at 6 (six) AM. 04/06/17  Yes Mikhail, Nita Sells, DO  lisinopril (PRINIVIL,ZESTRIL) 5 MG tablet Take 1 tablet (5 mg total) by mouth daily. 04/07/17  Yes Mikhail, Nita Sells, DO  meloxicam (MOBIC) 7.5 MG tablet Take 7.5 mg by mouth daily.    Yes [provider]  methocarbamol (ROBAXIN) 500 MG tablet Take 500 mg by mouth every 6 (six) hours as needed for muscle spasms.    Yes  [provider]  Multiple Vitamins-Minerals (MULTIVITAMIN WITH MINERALS) tablet Take 1 tablet by mouth daily. (0900)   Yes [provider]  omeprazole (PRILOSEC) 20 MG capsule Take 20 mg by mouth daily at 6 (six) AM.    Yes [provider]  oxyCODONE (OXY IR/ROXICODONE) 5 MG immediate release tablet Take 1-2 tablets (5-10 mg total) by mouth every 4 (four) hours as needed (5mg  for moderate pain, 10mg  for severe pain). 04/22/17  Yes Gherghe, Daylene Katayama, MD  potassium chloride SA (K-DUR,KLOR-CON) 20 MEQ tablet Take 1 tablet (20 mEq total) by mouth 2 (two) times daily. Patient taking differently: Take 20 mEq by mouth daily.  04/22/17  Yes Gherghe, Daylene Katayama, MD  saccharomyces boulardii (FLORASTOR) 250 MG capsule Take 250 mg by mouth 2 (  two) times daily.   Yes [provider]  traMADol (ULTRAM) 50 MG tablet Take 1 tablet (50 mg total) by mouth every 6 (six) hours as needed for moderate pain. 04/06/17  Yes Mikhail, Nita Sells, DO  traZODone (DESYREL) 50 MG tablet Take 50 mg by mouth at bedtime.    Yes [provider]    Family History Family History  Problem Relation Age of Onset  . Heart failure Father   . Heart attack Father   . Heart failure Brother     Social History Social History   Tobacco Use  . Smoking status: Former Smoker    Packs/day: 1.00    Years: 57.00    Pack years: 57.00    Types: Cigarettes    Last attempt to quit: 03/19/2016    Years since quitting: 1.3  . Smokeless tobacco: Never Used  Substance Use Topics  . Alcohol use: No    Comment: 11/10/2016 I was drinking alot of beer; quit in 2015"   . Drug use: No     Allergies   Patient has no known allergies.   Review of Systems Review of Systems  Constitutional: Negative for fever.  HENT: Negative for sore throat.   Eyes: Negative for visual disturbance.  Respiratory: Positive for shortness of breath. Negative for cough.   Cardiovascular: Negative for chest pain.    Gastrointestinal: Negative for abdominal pain, blood in stool, diarrhea, nausea and vomiting.  Genitourinary: Negative for difficulty urinating and dysuria.  Musculoskeletal: Negative for back pain and neck stiffness.  Skin: Negative for rash.  Neurological: Negative for syncope and headaches.     Physical Exam Updated Vital Signs BP (!) 82/61   Pulse (!) 135   Temp 97.6 F (36.4 C) (Oral)   Resp 15   Ht 5\' 10"  (1.778 m)   Wt 79.4 kg (175 lb)   SpO2 98%   BMI 25.11 kg/m   Physical Exam  Constitutional: He is oriented to person, place, and time. He appears well-developed and well-nourished. No distress.  HENT:  Head: Normocephalic and atraumatic.  Eyes: Conjunctivae and EOM are normal.  Neck: Normal range of motion.  Cardiovascular: Normal heart sounds and intact distal pulses. An irregularly irregular rhythm present. Tachycardia present. Exam reveals no gallop and no friction rub.  No murmur heard. Pulmonary/Chest: Effort normal. No respiratory distress. He has decreased breath sounds in the right lower field. He has no wheezes. He has rhonchi in the right middle field. He has no rales.  Abdominal: Soft. He exhibits no distension. There is no tenderness. There is no guarding.  Musculoskeletal: He exhibits no edema.  Neurological: He is alert and oriented to person, place, and time.  Skin: Skin is warm and dry. He is not diaphoretic.  Nursing note and vitals reviewed.    ED Treatments / Results  Labs (all labs ordered are listed, but only abnormal results are displayed) Labs Reviewed  MRSA PCR SCREENING - Abnormal; Notable for the following components:      Result Value   MRSA by PCR POSITIVE (*)    All other components within normal limits  BRAIN NATRIURETIC PEPTIDE - Abnormal; Notable for the following components:   B Natriuretic Peptide 508.4 (*)    All other components within normal limits  COMPREHENSIVE METABOLIC PANEL - Abnormal; Notable for the following  components:   CO2 21 (*)    BUN 41 (*)    Creatinine, Ser 1.43 (*)    Calcium 8.2 (*)  Total Protein 5.7 (*)    Albumin 3.1 (*)    AST 11 (*)    ALT 8 (*)    GFR calc non Af Amer 46 (*)    GFR calc Af Amer 54 (*)    All other components within normal limits  URINALYSIS, ROUTINE W REFLEX MICROSCOPIC - Abnormal; Notable for the following components:   APPearance HAZY (*)    Hgb urine dipstick SMALL (*)    Protein, ur 30 (*)    Leukocytes, UA LARGE (*)    Bacteria, UA RARE (*)    All other components within normal limits  CBC WITH DIFFERENTIAL/PLATELET - Abnormal; Notable for the following components:   WBC 15.2 (*)    Hemoglobin 9.3 (*)    HCT 30.2 (*)    MCV 71.4 (*)    MCH 22.0 (*)    RDW 18.0 (*)    nRBC 1 (*)    Neutro Abs 11.1 (*)    Monocytes Absolute 2.6 (*)    All other components within normal limits  PROTIME-INR - Abnormal; Notable for the following components:   Prothrombin Time 15.4 (*)    All other components within normal limits  BLOOD GAS, ARTERIAL - Abnormal; Notable for the following components:   pO2, Arterial 342 (*)    Acid-base deficit 4.2 (*)    All other components within normal limits  BODY FLUID CELL COUNT WITH DIFFERENTIAL - Abnormal; Notable for the following components:   Appearance, Fluid HAZY (*)    Monocyte-Macrophage-Serous Fluid 15 (*)    All other components within normal limits  LACTATE DEHYDROGENASE - Abnormal; Notable for the following components:   LDH 444 (*)    All other components within normal limits  PROTEIN, TOTAL - Abnormal; Notable for the following components:   Total Protein 5.7 (*)    All other components within normal limits  CULTURE, BLOOD (ROUTINE X 2)  CULTURE, BLOOD (ROUTINE X 2)  URINE CULTURE  BODY FLUID CULTURE  MAGNESIUM  TROPONIN I  APTT  PROTEIN, BODY FLUID (OTHER)  LACTATE DEHYDROGENASE, PLEURAL OR PERITONEAL FLUID  GLUCOSE, PLEURAL OR PERITONEAL FLUID  BASIC METABOLIC PANEL  CBC  I-STAT CG4 LACTIC  ACID, ED  CYTOLOGY - NON PAP    EKG  EKG Interpretation  Date/Time:  Monday 07-12-17 07:39:02 EST Ventricular Rate:  109 PR Interval:    QRS Duration: 118 QT Interval:  377 QTC Calculation: 508 R Axis:   101 Text Interpretation:  Atrial fibrillation Nonspecific intraventricular conduction delay Low voltage, extremity leads Probable anterolateral infarct, old Since prior ECG< patient now in atrial fibrillation Confirmed by Alvira Monday ([id number]) on 12-Jul-2017 6:23:45 PM       Radiology Dg Chest 2 View  Result Date: 07/12/17 CLINICAL DATA:  Shortness of Breath EXAM: CHEST  2 VIEW COMPARISON:  April 22, 2017 FINDINGS: There is persistent airspace consolidation throughout the right mid and lower lung zones with small right pleural effusion. There is a skin fold on the left without evident pneumothorax. There is no edema or consolidation on the left. Heart is upper normal in size with pulmonary vascularity showing evidence of a degree of pulmonary venous hypertension. No adenopathy. There is aortic atherosclerosis. No bone lesions. IMPRESSION: Persistent airspace consolidation throughout the right mid and lower lung zones with small right pleural effusion. Underlying pulmonary vascular congestion. There is aortic atherosclerosis. Aortic Atherosclerosis (ICD10-I70.0). Electronically Signed   By: Bretta Bang III M.D.   On: 07-12-17 07:44  Ct Angio Chest Pe W And/or Wo Contrast  Result Date: 31-Jul-2017 CLINICAL DATA:  76 year old male with shortness of breath EXAM: CT ANGIOGRAPHY CHEST WITH CONTRAST TECHNIQUE: Multidetector CT imaging of the chest was performed using the standard protocol during bolus administration of intravenous contrast. Multiplanar CT image reconstructions and MIPs were obtained to evaluate the vascular anatomy. CONTRAST:  80mL ISOVUE-370 IOPAMIDOL (ISOVUE-370) INJECTION 76% COMPARISON:  Most recent prior CT scan of the chest 03/17/2017 FINDINGS:  Cardiovascular: No evidence of pulmonary embolus. Normal caliber thoracic aorta. No aneurysm. Extensive coronary artery calcifications are present. The right heart, particularly the right atrium are enlarged. No pericardial effusion. Mediastinum/Nodes: Unremarkable CT appearance of the thyroid gland. No suspicious mediastinal or hilar adenopathy. No soft tissue mediastinal mass. The thoracic esophagus is unremarkable. Lungs/Pleura: Large right-sided pleural effusion with mildly convex margins. Calcified pleural plaques are present along the posteromedial aspect of the pleural space. The volume of the pleural effusion results in significant atelectasis of the right lower lobe. There is diffuse mild interlobular septal thickening in the bilateral upper lungs suggesting early interstitial edema. This is superimposed on a background of combined centrilobular and paraseptal pulmonary emphysema. A 4 mm pulmonary nodule is present in the left lower lobe (image 105 series 13). This is unchanged compared to the prior chest CT from 03/17/2017. No additional pulmonary nodules are identified. Upper Abdomen: Marked splenomegaly again noted. There is trace perihepatic ascites. Musculoskeletal: No acute fracture identified. 8 mm circumscribed lucency present within the inferior and anterior aspect of the T4 vertebral body. Stable T7 and T8 endplate deformities. Review of the MIP images confirms the above findings. IMPRESSION: 1. Progressive right-sided pleural effusion with associated progressive right lower lobe atelectasis. 2. 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 3. Stable nonspecific 8 mm lucency in the T4 vertebral body. While this may be degenerative in nature, a true lytic lesion is difficult to exclude radiographically. Does the patient have a known history of malignancy or myeloma? 4. Coronary artery calcifications. 5. Combined centrilobular and paraseptal  pulmonary emphysema. 6. Coronary artery calcifications. 7. Solitary 4-5 mm left lower lobe pulmonary nodule. No follow-up needed if patient is low-risk. Non-contrast chest CT can be considered in 12 months if patient is high-risk. This recommendation follows the consensus statement: Guidelines for Management of Incidental Pulmonary Nodules Detected on CT Images: From the Fleischner Society 2017; Radiology 2017; 284:228-243. Aortic Atherosclerosis (ICD10-I70.0) and Emphysema (ICD10-J43.9). Electronically Signed   By: Malachy Moan M.D.   On: July 31, 2017 10:48   US Abdomen Complete  Result Date: 2017/07/31 CLINICAL DATA:  Splenomegaly. EXAM: ABDOMEN ULTRASOUND COMPLETE COMPARISON:  Chest CT 2017/07/31 and 03/17/2017 FINDINGS: Gallbladder: Multiple small gallstones with the largest measuring 4.5 mm. No gallbladder wall thickening. Negative sonographic Murphy's sign. No adjacent free fluid. Common bile duct: Diameter: 2.5 mm. Liver: Minimal increased parenchymal echogenicity without focal mass. Portal vein is patent on color Doppler imaging with normal direction of blood flow towards the liver. IVC: No abnormality visualized. Pancreas: Not visualized. Spleen: Moderate to severe splenomegaly measuring 26.1 cm in greatest diameter. Volume 4087 cubic cm. Right Kidney: Length: 11.3 cm. Mild increased cortical echogenicity. No mass or hydronephrosis visualized. Left Kidney: Length: 11.0 cm. Mild increased cortical echogenicity. No mass or hydronephrosis visualized. Abdominal aorta: No aneurysm visualized. Mild atherosclerotic disease. Other findings: Small right pleural effusion. Minimal amount of perihepatic fluid. IMPRESSION: Mild cholelithiasis without additional sonographic evidence to suggest cholecystitis. No ductal dilatation. Mild hepatic steatosis.  Small amount of perihepatic fluid. Known moderate to severe splenomegaly as described. Mild bilateral increased renal cortical echogenicity which may be due to  medical renal disease. Small right pleural effusion. Electronically Signed   By: Elberta Fortisaniel  Boyle M.D.   On: 06/26/2017 15:19   Dg Chest Port 1 View  Result Date: 07/15/2017 CLINICAL DATA:  Status post right thoracentesis. EXAM: PORTABLE CHEST 1 VIEW COMPARISON:  Chest CT, 06/23/2017 at 10:22 a.m. Chest radiograph, 07/04/2017 at 7:23 a.m. FINDINGS: There has been a significant reduction in right pleural fluid following thoracentesis. There is no pneumothorax. Hazy persistent opacity in the right lung is likely due to atelectasis with some residual fluid. Left lung remains clear. IMPRESSION: 1. Significant reduction in right pleural fluid following right thoracentesis. 2. No pneumothorax. Electronically Signed   By: Amie Portlandavid  Ormond M.D.   On: 07/09/2017 16:35    Procedures .Critical Care Performed by: Alvira MondaySchlossman, Shuna Tabor, MD Authorized by: Alvira MondaySchlossman, Amir Fick, MD   Critical care provider statement:    Critical care time (minutes):  60   Critical care was necessary to treat or prevent imminent or life-threatening deterioration of the following conditions:  Circulatory failure, cardiac failure, sepsis and shock   Critical care was time spent personally by me on the following activities:  Discussions with consultants, examination of patient, evaluation of patient's response to treatment, pulse oximetry, re-evaluation of patient's condition, ordering and review of radiographic studies and ordering and review of laboratory studies   (including critical care time)  Medications Ordered in ED Medications  0.9 %  sodium chloride infusion (1,000 mLs Intravenous New Bag/Given 07/18/2017 1600)  acetaminophen (TYLENOL) tablet 650 mg (not administered)  ondansetron (ZOFRAN) injection 4 mg (4 mg Intravenous Given 06/30/2017 1704)  allopurinol (ZYLOPRIM) tablet 100 mg (100 mg Oral Not Given 06/21/2017 1733)  aspirin chewable tablet 81 mg (81 mg Oral Not Given 06/29/2017 1737)  atorvastatin (LIPITOR) tablet 20 mg (20 mg Oral  Not Given 07/18/2017 1738)  cholecalciferol (VITAMIN D) tablet 1,000 Units (not administered)  gabapentin (NEURONTIN) capsule 200 mg (not administered)  guaiFENesin (MUCINEX) 12 hr tablet 1,200 mg (not administered)  levothyroxine (SYNTHROID, LEVOTHROID) tablet 25 mcg (not administered)  methocarbamol (ROBAXIN) tablet 500 mg (not administered)  oxyCODONE (Oxy IR/ROXICODONE) immediate release tablet 5-10 mg (not administered)  saccharomyces boulardii (FLORASTOR) capsule 250 mg (not administered)  traMADol (ULTRAM) tablet 50 mg (not administered)  traZODone (DESYREL) tablet 50 mg (not administered)  ipratropium-albuterol (DUONEB) 0.5-2.5 (3) MG/3ML nebulizer solution 3 mL (3 mLs Nebulization Not Given 06/29/2017 1412)  albuterol (PROVENTIL) (2.5 MG/3ML) 0.083% nebulizer solution 2.5 mg (not administered)  pantoprazole (PROTONIX) EC tablet 40 mg (not administered)  multivitamin with minerals tablet 1 tablet (1 tablet Oral Not Given 06/30/2017 1734)  norepinephrine (LEVOPHED) 4mg  in D5W 250mL premix infusion (10 mcg/min Intravenous Restarted 06/22/2017 1822)  amiodarone (NEXTERONE PREMIX) 360-4.14 MG/200ML-% (1.8 mg/mL) IV infusion (60 mg/hr Intravenous Rate/Dose Change 07/17/2017 1738)    Followed by  amiodarone (NEXTERONE PREMIX) 360-4.14 MG/200ML-% (1.8 mg/mL) IV infusion (not administered)  midazolam (VERSED) 2 MG/2ML injection (not administered)  fentaNYL (SUBLIMAZE) 100 MCG/2ML injection (not administered)  ceFEPIme (MAXIPIME) 2 g in dextrose 5 % 50 mL IVPB (0 g Intravenous Stopped 07/06/2017 0941)  vancomycin (VANCOCIN) IVPB 1000 mg/200 mL premix (0 mg Intravenous Stopped 06/18/2017 1146)  sodium chloride 0.9 % bolus 1,000 mL (0 mLs Intravenous Stopped 07/10/2017 1147)  sodium chloride 0.9 % bolus 1,000 mL (0 mLs Intravenous Stopped 06/22/2017 1147)  iopamidol (ISOVUE-370) 76 % injection (80  mLs Intravenous Contrast Given 07/13/2017 1010)  sodium chloride 0.9 % bolus 500 mL (0 mLs Intravenous Paused 07/13/2017  1319)  hydrocortisone sodium succinate (SOLU-CORTEF) 100 MG injection 100 mg (100 mg Intravenous Given 07/13/2017 1257)  amiodarone (NEXTERONE) 1.8 mg/mL load via infusion 150 mg (150 mg Intravenous Bolus from Bag 07/10/2017 1729)  amiodarone (NEXTERONE PREMIX) 360-4.14 MG/200ML-% (1.8 mg/mL) IV infusion (  Duplicate 06/18/2017 1736)     Initial Impression / Assessment and Plan / ED Course  I have reviewed the triage vital signs and the nursing notes.  Pertinent labs & imaging results that were available during my care of the patient were reviewed by me and considered in my medical decision making (see chart for details).    76 year old male with history of COPD on 2 L of oxygen at home, CKD, hypothyroidism, recurrent right pleural effusion, combined systolic and diastolic CHF, coronary artery disease, history of peripheral artery disease with right BKA who presents with shortness of breath.  Patient was sent [facility] due to worsening hypoxia and decreased blood pressures.  He received 1 L of normal saline per EMS.  On arrival to the emergency department, he is mentating normally, with blood pressures in the 90s systolic, and heart rate fluctuating between 90s up to 120 with patient in atrial fibrillation.  He has no prior history of atrial fibrillation.  Overall on arrival,  I feel most likely etiology of his hypotension is related to combination of congestive heart failure and new atrial fibrillation, however given his history of having loculated effusion and pneumonia in the past, will cover with vancomycin and cefepime, gave 1 additional L of NS.   Rectal temperature WNL.  Lactic acid WNL.  White blood cell count 15000.  Given leukocytosis, XR results, have concern for HCAP, and UA concerning for urinary tract infection.  In light of these results, I feel most likely etiology of his symptoms is sepsis. Given additional fluid to total 3L.  Patient with improvement in blood pressures and heart  rate initially, blood pressure 100s systolic, HR to 100s.  We had difficulty obtaining saturations, but had reassuring blood gas, saturations upper 90s on 4L pNC.  CT PE study done given hypoxia, new afib, which shows pleural effusion, likely atelectasis.  Continue to feel UA consistent with uti and likely source of infection.  Blood pressures again decreased down to 80s, repeat patient with normal MAPs and normal mentation, ordered additional 500cc of NS. Discussed with hospitalist but given hypotension, discussed with Crtical Care, given hydrocortizone.  After consult, pt with increased dyspnea, discontinued 500cc NS. He had decreased breath sounds on exam, tachypnea, occasional wheezing, placed on albuterol for COPD.   Critical care evaluated. Pt noted to have tachycardia following albuterol, BP 120s systolic on recheck, unclear if true reading given priors, nursing to recheck and notify critical care. Pt admitted to critical care for further care.    Final Clinical Impressions(s) / ED Diagnoses   Final diagnoses:  Sepsis, due to unspecified organism [facility](HCC)  Acute respiratory failure with hypoxia [facility](HCC)  Atrial fibrillation, unspecified type [facility](HCC)    ED Discharge Orders        Ordered    Amb referral to AFIB Clinic     06/20/2017 0656       Alvira MondaySchlossman, Nylia Gavina, MD 06/23/2017 1825

## 2017-07-12 ENCOUNTER — Inpatient Hospital Stay (HOSPITAL_COMMUNITY): Payer: Medicare HMO

## 2017-07-12 DIAGNOSIS — L8990 Pressure ulcer of unspecified site, unspecified stage: Secondary | ICD-10-CM | POA: Diagnosis present

## 2017-07-12 DIAGNOSIS — R0600 Dyspnea, unspecified: Secondary | ICD-10-CM

## 2017-07-12 LAB — BLOOD GAS, ARTERIAL
ACID-BASE DEFICIT: 6.9 mmol/L — AB (ref 0.0–2.0)
Bicarbonate: 17.4 mmol/L — ABNORMAL LOW (ref 20.0–28.0)
Drawn by: 422461
FIO2: 30
O2 SAT: 93.7 %
PATIENT TEMPERATURE: 100.1
PEEP/CPAP: 8 cmH2O
PH ART: 7.337 — AB (ref 7.350–7.450)
RATE: 16 resp/min
VT: 580 mL
pCO2 arterial: 33.7 mmHg (ref 32.0–48.0)
pO2, Arterial: 78.6 mmHg — ABNORMAL LOW (ref 83.0–108.0)

## 2017-07-12 LAB — GLUCOSE, CAPILLARY
GLUCOSE-CAPILLARY: 102 mg/dL — AB (ref 65–99)
Glucose-Capillary: 91 mg/dL (ref 65–99)

## 2017-07-12 LAB — BASIC METABOLIC PANEL
Anion gap: 10 (ref 5–15)
BUN: 49 mg/dL — AB (ref 6–20)
CALCIUM: 8 mg/dL — AB (ref 8.9–10.3)
CHLORIDE: 108 mmol/L (ref 101–111)
CO2: 19 mmol/L — ABNORMAL LOW (ref 22–32)
CREATININE: 1.78 mg/dL — AB (ref 0.61–1.24)
GFR, EST AFRICAN AMERICAN: 41 mL/min — AB (ref >60)
GFR, EST NON AFRICAN AMERICAN: 36 mL/min — AB (ref >60)
Glucose, Bld: 144 mg/dL — ABNORMAL HIGH (ref 65–99)
Potassium: 5.2 mmol/L — ABNORMAL HIGH (ref 3.5–5.1)
SODIUM: 137 mmol/L (ref 135–145)

## 2017-07-12 LAB — PHOSPHORUS
PHOSPHORUS: 5.4 mg/dL — AB (ref 2.5–4.6)
PHOSPHORUS: 5.7 mg/dL — AB (ref 2.5–4.6)

## 2017-07-12 LAB — PROCALCITONIN: PROCALCITONIN: 3.62 ng/mL

## 2017-07-12 LAB — CBC
HCT: 34.8 % — ABNORMAL LOW (ref 39.0–52.0)
Hemoglobin: 10.8 g/dL — ABNORMAL LOW (ref 13.0–17.0)
MCH: 22 pg — ABNORMAL LOW (ref 26.0–34.0)
MCHC: 31 g/dL (ref 30.0–36.0)
MCV: 70.9 fL — ABNORMAL LOW (ref 78.0–100.0)
Platelets: 251 K/uL (ref 150–400)
RBC: 4.91 MIL/uL (ref 4.22–5.81)
RDW: 18.2 % — ABNORMAL HIGH (ref 11.5–15.5)
WBC: 37.9 K/uL — ABNORMAL HIGH (ref 4.0–10.5)

## 2017-07-12 LAB — MAGNESIUM
MAGNESIUM: 2.4 mg/dL (ref 1.7–2.4)
Magnesium: 2.2 mg/dL (ref 1.7–2.4)
Magnesium: 2.1 mg/dL (ref 1.7–2.4)

## 2017-07-12 LAB — URINE CULTURE: CULTURE: NO GROWTH

## 2017-07-12 LAB — ECHOCARDIOGRAM COMPLETE
Height: 70 in
Reg peak vel: 306 cm/s
TR max vel: 306 cm/s
WEIGHTICAEL: 2624.36 [oz_av]

## 2017-07-12 LAB — HEPARIN LEVEL (UNFRACTIONATED): Heparin Unfractionated: <0.1 IU/mL — ABNORMAL LOW (ref 0.30–0.70)

## 2017-07-12 MED ORDER — HEPARIN (PORCINE) IN NACL 100-0.45 UNIT/ML-% IJ SOLN
1300.0000 [IU]/h | INTRAMUSCULAR | Status: DC
  Administered 2017-07-12: 1300 [IU]/h via INTRAVENOUS
  Filled 2017-07-12: qty 250

## 2017-07-12 MED ORDER — HEPARIN (PORCINE) IN NACL 100-0.45 UNIT/ML-% IJ SOLN
1700.0000 [IU]/h | INTRAMUSCULAR | Status: DC
  Administered 2017-07-12: 1700 [IU]/h via INTRAVENOUS

## 2017-07-12 MED ORDER — LEVOTHYROXINE SODIUM 50 MCG PO TABS
50.0000 ug | ORAL_TABLET | Freq: Every day | ORAL | Status: DC
  Administered 2017-07-12 – 2017-07-13 (×2): 50 ug
  Filled 2017-07-12: qty 1

## 2017-07-12 MED ORDER — VANCOMYCIN HCL IN DEXTROSE 750-5 MG/150ML-% IV SOLN
750.0000 mg | INTRAVENOUS | Status: DC
  Administered 2017-07-12: 750 mg via INTRAVENOUS
  Filled 2017-07-12 (×2): qty 150

## 2017-07-12 MED ORDER — AMIODARONE HCL 200 MG PO TABS
200.0000 mg | ORAL_TABLET | Freq: Every day | ORAL | Status: DC
Start: 2017-07-12 — End: 2017-07-14
  Administered 2017-07-12 – 2017-07-13 (×2): 200 mg
  Filled 2017-07-12 (×2): qty 1

## 2017-07-12 MED ORDER — HEPARIN BOLUS VIA INFUSION
2000.0000 [IU] | Freq: Once | INTRAVENOUS | Status: AC
  Administered 2017-07-12: 2000 [IU] via INTRAVENOUS
  Filled 2017-07-12: qty 2000

## 2017-07-12 MED ORDER — VITAL HIGH PROTEIN PO LIQD
1000.0000 mL | ORAL | Status: DC
Start: 2017-07-12 — End: 2017-07-13
  Administered 2017-07-12 – 2017-07-13 (×2): 1000 mL
  Filled 2017-07-12 (×2): qty 1000

## 2017-07-12 MED ORDER — DEXTROSE 5 % IV SOLN
2.0000 g | INTRAVENOUS | Status: DC
  Administered 2017-07-12: 2 g via INTRAVENOUS
  Filled 2017-07-12 (×2): qty 2

## 2017-07-12 MED ORDER — CHLORHEXIDINE GLUCONATE CLOTH 2 % EX PADS
6.0000 | MEDICATED_PAD | Freq: Every day | CUTANEOUS | Status: DC
  Administered 2017-07-13 – 2017-07-14 (×2): 6 via TOPICAL

## 2017-07-12 MED ORDER — PRO-STAT SUGAR FREE PO LIQD
30.0000 mL | Freq: Two times a day (BID) | ORAL | Status: DC
  Administered 2017-07-12 – 2017-07-13 (×3): 30 mL
  Filled 2017-07-12 (×3): qty 30

## 2017-07-12 MED ORDER — MUPIROCIN 2 % EX OINT
1.0000 "application " | TOPICAL_OINTMENT | Freq: Two times a day (BID) | CUTANEOUS | Status: DC
  Administered 2017-07-12 – 2017-07-13 (×4): 1 via NASAL
  Filled 2017-07-12: qty 22

## 2017-07-12 NOTE — Progress Notes (Signed)
Pharmacy Antibiotic Note  Caleb MarinerJerald J Hays is a 76 y.o. male admitted on 08/06/16 with new onset Afib & Rt pleural effusion, infiltrate.  Initial antibiotic doses were given in ED 12/24 then stopped on admission.   Procalcitonin was elevated today so pharmacy has been consulted for Vanc & Cefepime dosing. 07/12/2017:   Procalcitonin 3.62  Tm 100.1  Leukocytosis now trending back down (WBC 15.2>>45>>37.9)  Renal function worsening (Scr 1.43>>1.56>>1.78)  Plan:  Cefepime 2gm IV q24h  Vancomycin 750mg  IV 24h (target AUC/MIC 400-500)  Check Vancomycin levels at steady-state  Monitor renal function, cx data  Height: 5\' 10"  (177.8 cm) Weight: 164 lb 0.4 oz (74.4 kg) IBW/kg (Calculated) : 73  Temp (24hrs), Avg:98.6 F (37 C), Min:96.9 F (36.1 C), Max:100.1 F (37.8 C)  Recent Labs  Lab 02-10-17 0740 02-10-17 0749 02-10-17 0828 02-10-17 1930 07/12/17 0323  WBC 15.2*  --   --  45.0* 37.9*  CREATININE  --   --  1.43* 1.56* 1.78*  LATICACIDVEN  --  1.26  --   --   --     Estimated Creatinine Clearance: 37 mL/min (A) (by C-G formula based on SCr of 1.78 mg/dL (H)).    No Known Allergies  Antimicrobials this admission: 12/24 Cefepime >> 12/24 Vanc >>   Dose adjustments this admission:  Microbiology results: 12/24 BCx:  12/24 UCx: NG-F  12/24 Pleural fluid:  12/24 MRSA PCR: Positive  Thank you for allowing pharmacy to be a part of this patient's care.  Elson ClanLilliston, Ke[place]unna Skipper Michelle 07/12/2017 12:50 PM

## 2017-07-12 NOTE — Progress Notes (Signed)
procalcitonin elevated.  Will restart ABx.  Coralyn HellingVineet Heather Streeper, MD [facility] 07/12/2017, 12:34 PM Pager:  [phone] After 7pm call: [phone]

## 2017-07-12 NOTE — Progress Notes (Signed)
[facility] for IV heparin Indication: atrial fibrillation  No Known Allergies  Patient Measurements: Height: 5' 10"  (177.8 cm) Weight: 164 lb 0.4 oz (74.4 kg) IBW/kg (Calculated) : 73 Heparin Dosing Weight: TBW  Vital Signs: Temp: 99.7 F (37.6 C) (12/25 0421) Temp Source: Oral (12/25 0421) BP: 77/48 (12/25 0500) Pulse Rate: 74 (12/25 0500)  Labs: Recent Labs    06/28/2017 0740 06/25/2017 0828 07/09/2017 1930 07/12/17 0323  HGB 9.3*  --  10.3* 10.8*  HCT 30.2*  --  32.6* 34.8*  PLT 151  --  216 251  APTT 31  --   --   --   LABPROT 15.4*  --   --   --   INR 1.23  --   --   --   HEPARINUNFRC  --   --   --  <0.10*  CREATININE  --  1.43* 1.56* 1.78*  TROPONINI  --  <0.03  --   --     Estimated Creatinine Clearance: 37 mL/min (A) (by C-G formula based on SCr of 1.78 mg/dL (H)).   Medical History: Past Medical History:  Diagnosis Date  . Acute on chronic combined systolic (congestive) and diastolic (congestive) heart failure ([place]) 04/2017  . Adult failure to thrive    /notes 06/23/2015  . Anemia   . Arthritis    "hands, fingers" (11/10/2016  . CAD (coronary artery disease)    a. 03/2017: multivessel CAD and not felt to be a CABG candidate, s/p DESx2 to LCx and OM1.   . CKD (chronic kidney disease), stage III ([place])   . COPD (chronic obstructive pulmonary disease) (HCC)    [employer] TIME SMOKER  . Critical lower limb ischemia   . 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 Depression    "periods of depression" (06/23/2015)  . Gout   . Hypothyroidism   . PAD (peripheral artery disease) ([place])   . Physical deconditioning   . Pyelonephritis 06/23/2015  . Status post foot surgery    right fifth toe amputation by Dr. Sharol Given     Medications:  Medications Prior to Admission  Medication Sig Dispense Refill Last Dose  . albuterol (PROVENTIL HFA;VENTOLIN HFA) 108 (90 BASE) MCG/ACT inhaler Inhale 2 puffs into the lungs  2 (two) times daily. 0900 & 1700   07/10/2017 at Unknown time  . allopurinol (ZYLOPRIM) 100 MG tablet Take 100 mg by mouth daily.    07/10/2017 at Unknown time  . aspirin 81 MG chewable tablet Chew 81 mg by mouth daily.   07/10/2017 at Unknown time  . atorvastatin (LIPITOR) 20 MG tablet Take 20 mg by mouth daily at 6 PM.   07/10/2017 at Unknown time  . carvedilol (COREG) 3.125 MG tablet Take 1 tablet (3.125 mg total) by mouth 2 (two) times daily. 60 tablet 0 07/10/2017 at 2000  . cholecalciferol (VITAMIN D) 1000 units tablet Take 1,000 Units by mouth daily. (0900)   07/10/2017 at Unknown time  . clopidogrel (PLAVIX) 75 MG tablet Take 1 tablet (75 mg total) by mouth daily with breakfast. 30 tablet 6 07/10/2017 at Unknown time  . ferrous sulfate 325 (65 FE) MG tablet Take 1 tablet (325 mg total) by mouth 2 (two) times daily with a meal. 60 tablet 0 07/10/2017 at Unknown time  . furosemide (LASIX) 40 MG tablet Take 1 tablet (40 mg total) by mouth 2 (two) times daily. (Patient taking differently: Take 40 mg by mouth  daily. ) 30 tablet  07/10/2017 at Unknown time  . gabapentin (NEURONTIN) 100 MG capsule Take 200 mg by mouth at bedtime.    07/10/2017 at Unknown time  . guaiFENesin (MUCINEX) 600 MG 12 hr tablet Take 1,200 mg by mouth 2 (two) times daily as needed for cough.   unknown  . ipratropium-albuterol (DUONEB) 0.5-2.5 (3) MG/3ML SOLN Take 3 mLs by nebulization 4 (four) times daily as needed (for shortness of breath).   07/10/2017 at Unknown time  . levothyroxine (SYNTHROID, LEVOTHROID) 25 MCG tablet Take 1 tablet (25 mcg total) by mouth daily at 6 (six) AM.   07/10/2017 at Unknown time  . lisinopril (PRINIVIL,ZESTRIL) 5 MG tablet Take 1 tablet (5 mg total) by mouth daily. 30 tablet 0 07/10/2017 at Unknown time  . meloxicam (MOBIC) 7.5 MG tablet Take 7.5 mg by mouth daily.    07/10/2017 at Unknown time  . methocarbamol (ROBAXIN) 500 MG tablet Take 500 mg by mouth every 6 (six) hours as needed for  muscle spasms.    unknown  . Multiple Vitamins-Minerals (MULTIVITAMIN WITH MINERALS) tablet Take 1 tablet by mouth daily. (0900)   07/10/2017 at Unknown time  . omeprazole (PRILOSEC) 20 MG capsule Take 20 mg by mouth daily at 6 (six) AM.    07/10/2017 at Unknown time  . oxyCODONE (OXY IR/ROXICODONE) 5 MG immediate release tablet Take 1-2 tablets (5-10 mg total) by mouth every 4 (four) hours as needed (30m for moderate pain, 180mfor severe pain). 10 tablet 0 unknown  . potassium chloride SA (K-DUR,KLOR-CON) 20 MEQ tablet Take 1 tablet (20 mEq total) by mouth 2 (two) times daily. (Patient taking differently: Take 20 mEq by mouth daily. )   07/10/2017 at Unknown time  . saccharomyces boulardii (FLORASTOR) 250 MG capsule Take 250 mg by mouth 2 (two) times daily.   07/10/2017 at Unknown time  . traMADol (ULTRAM) 50 MG tablet Take 1 tablet (50 mg total) by mouth every 6 (six) hours as needed for moderate pain. 20 tablet 0 unknown  . traZODone (DESYREL) 50 MG tablet Take 50 mg by mouth at bedtime.    07/10/2017 at Unknown time   Scheduled:  . aspirin  81 mg Per Tube Daily  . atorvastatin  20 mg Per Tube q1800  . chlorhexidine gluconate (MEDLINE KIT)  15 mL Mouth Rinse BID  . clopidogrel  75 mg Per Tube Q breakfast  . ipratropium-albuterol  3 mL Nebulization Q6H  . levothyroxine  25 mcg Per Tube QAC breakfast  . mouth rinse  15 mL Mouth Rinse QID  . pantoprazole sodium  40 mg Per Tube Q24H   Infusions:  . sodium chloride 75 mL/hr at 07/12/17 0400  . amiodarone 30 mg/hr (07/12/17 0400)  . heparin    . norepinephrine 12 mcg/min (07/12/17 0510)    Assessment: [age]oM from NH, with PMH COPD, combined CHF, CAD s/p stenting this September, CKD, R BKA, hypothyroid, EtOH abuse, valvular Dz and questionable hepatic dz. presenting with SOB and worsening pleural effusion requiring R thoracentesis. Afib noted in ER, and pharmacy consulted to dose IV heparin starting 6 hrs after thoracentesis   Baseline  INR, aPTT: WNL  Prior anticoagulation: none, on ASA/Plavix PTA for recent stenting  Significant events:  12/24: Thoracentesis performed approx 4pm  12/24: Patient suffered cardiac arrest ~6pm with ROSC achieved; proceed with heparin per CCM  12/24  CBC: Hgb low on admission (anemic at baseline); Plt borderline low  No bleeding or infusion issues per  nursing  CrCl: 45 ml/min; suspect will drop after cardiac arrest Today, 12/25  0323 HL=<0.10 below goal, drawn at 6 hrs vs 8 hrs?, no bleeding or infusion issues per RN.  Goal of Therapy: Heparin level 0.3-0.7 units/ml Monitor platelets by anticoagulation protocol: Yes  Plan:   Increase heparin drip to 1300 units/hr  Recheck HL in 8 hours  Daily CBC, daily heparin level once stable  Monitor for signs of bleeding (especially with possible broken ribs or other chest trauma during CPR) or thrombosis  Dorrene German 07/12/2017, 5:43 AM

## 2017-07-12 NOTE — Progress Notes (Signed)
  Echocardiogram 2D Echocardiogram has been performed.  Dorena Dewiffany G Akasia Ahmad 07/12/2017, 10:41 AM

## 2017-07-12 NOTE — Progress Notes (Signed)
[facility] PROGRESS NOTE  Date of Admission: 06/27/2017 Referring Provider: Dr. Elise BenneNanivati, ER Chief Complaint: Short of breath  HPI: 76 yo male from NH with shortness of breath.  He says this was sudden in onset.  He has not been having cough, wheeze, sputum, fever, chest pain, headache, sinus congestion, nausea, abdominal pain, or diarrhea.  He is followed by Dr. Isaiah SergeMannam in pulmonary office for COPD on home oxygen, and recurrent right pleural effusion.  He had effusion last tapped about 1 month ago.  CT chest shows increased effusion.  He was noted to have low BP in ER.  He was also noted to A fib on ECG.  He has hx of CAD s/p stent in 04/05/17, combined CHF, and valvular heart disease.  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  Past Medical History: He  has a past medical history of Acute on chronic combined systolic (congestive) and diastolic (congestive) heart failure (HCC) (04/2017), Adult failure to thrive, Anemia, Arthritis, CAD (coronary artery disease), CKD (chronic kidney disease), stage III (HCC), COPD (chronic obstructive pulmonary disease) (HCC), Critical lower limb ischemia, Dehiscence of amputation stump (HCC), Depression, Gout, Hypothyroidism, PAD (peripheral artery disease) (HCC), Physical deconditioning, Pyelonephritis (06/23/2015), and Status post foot surgery.  Subjective: Remains on pressors.  Needing increased FiO2.  Vital signs: BP 106/63 (BP Location: Left Arm)   Pulse 75   Temp 99.7 F (37.6 C) (Oral)   Resp 16   Ht 5\' 10"  (1.778 m)   Wt 164 lb 0.4 oz (74.4 kg)   SpO2 94%   BMI 23.53 kg/m   Hemodynamics: CVP:  [6 mmHg-22 mmHg] 8 mmHg  Vent settings: Vent Mode: PRVC FiO2 (%):  [30 %-100 %] 30 % Set Rate:  [16 bmp] 16 bmp Vt Set:  [580 mL] 580 mL PEEP:  [5 cmH20-8 cmH20] 8 cmH20 Plateau Pressure:  [15 cmH20-20 cmH20] 17 cmH20  Intake/Output: I/O last 3  completed shifts: In: 1828.9 [I.V.:1828.9] Out: 2551 [Urine:350; Emesis/NG output:201; Other:2000]  Physical Exam:  General - cachectic Eyes - pupils reactive ENT - ETT in place Cardiac - irregular, no murmur Chest - decreased BS, no wheeze Abd - soft, non tender Ext - Rt BKA, Lt TMA Skin - dressing over anterior chest clean Neuro - follows simple commands  Discussion: 76 yo male with acute on chronic respiratory failure with recurrent Rt pleural effusion, new onset A fib with RVR, acute on chronic combined CHF with hx of COPD.  Has unilateral infiltrate on CXR most likely from reexpansion pulmonary edema after thoracentesis.  This likely triggered respiratory leading to cardiac arrest.  Assessment/Plan:  Acute on chronic respiratory failure with hypoxia. - full vent support - f/u CXR - adjust PEEP/FiO2 to keep SpO2 > 92%  Rt sided lung infiltrate on CXR. - likely reexpansion pulmonary edema - f/u CXR - check procalcitonin >> if elevated, then restart Abx  Recurrent Rt pleural effusion. - f/u pleural fluid culture and cytology from 12/24  COPD with emphysema. - scheduled BDs  New onset A fib. Respiratory leading to cardiac arrest with VF and VT. Shock likely cardiac. Acute on chronic combined CHF. CAD s/p stenting September 2018. Hx of Valvular heart disease, PAD, HLD. - continue ASA, plavix, lipitor - continue amiodarone >> change to per tube - continue heparin gtt - hold outpt coreg, lasix, lisinopril - levophed to keep MAP > 65 - f/u Echo - monitor  CVP q12h  Acute metabolic encephalopathy. Peripheral neuropathy. - RASS goal 0 to -1 - hold outpt neurontin, robaxin, oxycodone  Hx of Gout, Hypothyroidism. - hold allopurinol - TSH 11.5 from 12/24 - increase synthroid to 50 mcg  Splenomegaly with hx of ETOH. - monitor clinically  Moderate protein calorie malnutrition. - tube feeds  CKD 3. - monitor urine outpt - keep foley in  Sacral pressure  wound, present prior to admission. Anterior chest wall burn after defibrillation. - wound care  DVT prophylaxis: heparin gtt  SUP: Protonix Diet: Tube feeds Goals of care: Spoke with pt's niece (decision maker) 12/24.  Continue current medical therapies.  No CPR, no defibrillation.  He would not want long term vent support.  CC time 36 minutes  Coralyn HellingVineet Joziyah Roblero, MD [facility] 07/12/2017, 8:04 AM Pager:  [phone] After 7pm call: [phone]  FLOW SHEET  Cultures: Blood 12/24 >> Urine 12/24 >> Rt pleural fluid 12/24 >>   Antibiotics: Vancomycin 12/24 >> 12/24 Cefepime 12/24 >> 12/24  Studies: Echo 03/31/17 >> EF 30 to 35%, grade 2 DD, mild AS, mod MR, PAS 57 mmHg CT angio chest 12/24 >> Large Rt effusion, calcified pleural plaques, compressive ATX, interlobular septal thickening, centrilobular and paraseptal emphysema, 4 mm nodule LLL, marked splenomegaly Rt thoracentesis 12/24 >> 1.6 liters, glucose 76, LDH 136, WBC 215 (82%L) Abd u/s 12/24 >> cholelithiasis, hepatic steatosis, mod/severe splenomegaly, mild b/l renal cortical echogenicity  Events: 12/24 Admit, respiratory leading to cardiac arrest  Lines/tubes: ETT 12/24 >> Lt IJ CVL 12/24 >>  Consults:  Resolved Problems:  Labs: CMP Latest Ref Rng & Units 07/12/2017 06/18/2017 06/19/2017  Glucose 65 - 99 mg/dL 478(G144(H) 956(O225(H) -  BUN 6 - 20 mg/dL 13(Y49(H) 86(V42(H) -  Creatinine 0.61 - 1.24 mg/dL 7.84(O1.78(H) 9.62(X1.56(H) -  Sodium 135 - 145 mmol/L 137 133(L) -  Potassium 3.5 - 5.1 mmol/L 5.2(H) 5.3(H) -  Chloride 101 - 111 mmol/L 108 106 -  CO2 22 - 32 mmol/L 19(L) 20(L) -  Calcium 8.9 - 10.3 mg/dL 8.0(L) 7.7(L) -  Total Protein 6.5 - 8.1 g/dL - 5.5(L) 5.7(L)  Total Bilirubin 0.3 - 1.2 mg/dL - 1.1 -  Alkaline Phos 38 - 126 U/L - 74 -  AST 15 - 41 U/L - 21 -  ALT 17 - 63 U/L - 12(L) -    CBC Latest Ref Rng & Units 07/12/2017 06/29/2017 07/02/2017  WBC 4.0 - 10.5 K/uL 37.9(H) 45.0(H) 15.2(H)  Hemoglobin 13.0  - 17.0 g/dL 10.8(L) 10.3(L) 9.3(L)  Hematocrit 39.0 - 52.0 % 34.8(L) 32.6(L) 30.2(L)  Platelets 150 - 400 K/uL 251 216 151    CBG (last 3)  No results for input(s): GLUCAP in the last 72 hours.  Imaging: Dg Chest 2 View  Result Date: 06/26/2017 CLINICAL DATA:  Shortness of Breath EXAM: CHEST  2 VIEW COMPARISON:  April 22, 2017 FINDINGS: There is persistent airspace consolidation throughout the right mid and lower lung zones with small right pleural effusion. There is a skin fold on the left without evident pneumothorax. There is no edema or consolidation on the left. Heart is upper normal in size with pulmonary vascularity showing evidence of a degree of pulmonary venous hypertension. No adenopathy. There is aortic atherosclerosis. No bone lesions. IMPRESSION: Persistent airspace consolidation throughout the right mid and lower lung zones with small right pleural effusion. Underlying pulmonary vascular congestion. There is aortic atherosclerosis. Aortic Atherosclerosis (ICD10-I70.0). Electronically Signed   By: Bretta BangWilliam  Woodruff III M.D.   On:  07/05/2017 07:44   Dg Abd 1 View  Result Date: 07/12/2017 CLINICAL DATA:  Orogastric tube placement. EXAM: ABDOMEN - 1 VIEW COMPARISON:  Lumbar spine radiographs performed 06/23/2015 FINDINGS: The patient's enteric tube is seen ending overlying the body of the stomach, with the side port about the gastric fundus. The visualized bowel gas pattern is grossly unremarkable. No free intra-abdominal air is seen, though evaluation for free air is limited on a single supine view. No acute osseous abnormalities are identified. Mild degenerative change is noted along the lumbar spine. IMPRESSION: Enteric tube noted ending overlying the body of the stomach, with the side port about the fundus of the stomach. Electronically Signed   By: Roanna Raider M.D.   On: 07/12/2017 06:09   Ct Angio Chest Pe W And/or Wo Contrast  Result Date: 07/08/2017 CLINICAL DATA:   76 year old male with shortness of breath EXAM: CT ANGIOGRAPHY CHEST WITH CONTRAST TECHNIQUE: Multidetector CT imaging of the chest was performed using the standard protocol during bolus administration of intravenous contrast. Multiplanar CT image reconstructions and MIPs were obtained to evaluate the vascular anatomy. CONTRAST:  80mL ISOVUE-370 IOPAMIDOL (ISOVUE-370) INJECTION 76% COMPARISON:  Most recent prior CT scan of the chest 03/17/2017 FINDINGS: Cardiovascular: No evidence of pulmonary embolus. Normal caliber thoracic aorta. No aneurysm. Extensive coronary artery calcifications are present. The right heart, particularly the right atrium are enlarged. No pericardial effusion. Mediastinum/Nodes: Unremarkable CT appearance of the thyroid gland. No suspicious mediastinal or hilar adenopathy. No soft tissue mediastinal mass. The thoracic esophagus is unremarkable. Lungs/Pleura: Large right-sided pleural effusion with mildly convex margins. Calcified pleural plaques are present along the posteromedial aspect of the pleural space. The volume of the pleural effusion results in significant atelectasis of the right lower lobe. There is diffuse mild interlobular septal thickening in the bilateral upper lungs suggesting early interstitial edema. This is superimposed on a background of combined centrilobular and paraseptal pulmonary emphysema. A 4 mm pulmonary nodule is present in the left lower lobe (image 105 series 13). This is unchanged compared to the prior chest CT from 03/17/2017. No additional pulmonary nodules are identified. Upper Abdomen: Marked splenomegaly again noted. There is trace perihepatic ascites. Musculoskeletal: No acute fracture identified. 8 mm circumscribed lucency present within the inferior and anterior aspect of the T4 vertebral body. Stable T7 and T8 endplate deformities. Review of the MIP images confirms the above findings. IMPRESSION: 1. Progressive right-sided pleural effusion with  associated progressive right lower lobe atelectasis. 2. 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 3. Stable nonspecific 8 mm lucency in the T4 vertebral body. While this may be degenerative in nature, a true lytic lesion is difficult to exclude radiographically. Does the patient have a known history of malignancy or myeloma? 4. Coronary artery calcifications. 5. Combined centrilobular and paraseptal pulmonary emphysema. 6. Coronary artery calcifications. 7. Solitary 4-5 mm left lower lobe pulmonary nodule. No follow-up needed if patient is low-risk. Non-contrast chest CT can be considered in 12 months if patient is high-risk. This recommendation follows the consensus statement: Guidelines for Management of Incidental Pulmonary Nodules Detected on CT Images: From the Fleischner Society 2017; Radiology 2017; 284:228-243. Aortic Atherosclerosis (ICD10-I70.0) and Emphysema (ICD10-J43.9). Electronically Signed   By: Malachy Moan M.D.   On: 07/15/2017 10:48   US Abdomen Complete  Result Date: 06/29/2017 CLINICAL DATA:  Splenomegaly. EXAM: ABDOMEN ULTRASOUND COMPLETE COMPARISON:  Chest CT 07/17/2017 and 03/17/2017 FINDINGS: Gallbladder: Multiple small gallstones with the largest measuring 4.5  mm. No gallbladder wall thickening. Negative sonographic Murphy's sign. No adjacent free fluid. Common bile duct: Diameter: 2.5 mm. Liver: Minimal increased parenchymal echogenicity without focal mass. Portal vein is patent on color Doppler imaging with normal direction of blood flow towards the liver. IVC: No abnormality visualized. Pancreas: Not visualized. Spleen: Moderate to severe splenomegaly measuring 26.1 cm in greatest diameter. Volume 4087 cubic cm. Right Kidney: Length: 11.3 cm. Mild increased cortical echogenicity. No mass or hydronephrosis visualized. Left Kidney: Length: 11.0 cm. Mild increased cortical echogenicity. No mass or hydronephrosis visualized.  Abdominal aorta: No aneurysm visualized. Mild atherosclerotic disease. Other findings: Small right pleural effusion. Minimal amount of perihepatic fluid. IMPRESSION: Mild cholelithiasis without additional sonographic evidence to suggest cholecystitis. No ductal dilatation. Mild hepatic steatosis.  Small amount of perihepatic fluid. Known moderate to severe splenomegaly as described. Mild bilateral increased renal cortical echogenicity which may be due to medical renal disease. Small right pleural effusion. Electronically Signed   By: Elberta Fortis M.D.   On: 04-Aug-2017 15:19   Dg Chest Port 1 View  Result Date: 07/12/2017 CLINICAL DATA:  Pleural effusion EXAM: PORTABLE CHEST 1 VIEW COMPARISON:  2017/08/04 FINDINGS: Support devices are stable. Right pleural effusion and diffuse right lung airspace disease again noted, not significantly changed. No confluent opacity on the left. Heart is borderline in size. IMPRESSION: Small right pleural effusion with diffuse right lung airspace disease, unchanged. Electronically Signed   By: Charlett Nose M.D.   On: 07/12/2017 07:10   Dg Chest Port 1 View  Result Date: 04-Aug-2017 CLINICAL DATA:  Central line and endotracheal tube placement. EXAM: PORTABLE CHEST 1 VIEW COMPARISON:  2017-08-04 at 1607 hour FINDINGS: Endotracheal tube tip projects 6 cm above the carina. Left internal jugular central venous line tip projects in the mid superior vena cava. No pneumothorax. Lung bases were excluded from the field of view. There are reticular opacities in the lungs apices associated with pleural thickening, consistent scarring. Hazy opacity is noted in the right mid lung similar to the earlier study. IMPRESSION: 1. Endotracheal tube tip projects 6 cm about the carina. 2. Left subclavian central venous line tip projects in the mid superior vena cava. No pneumothorax. Electronically Signed   By: Amie Portland M.D.   On: 2017/08/04 19:00   Dg Chest Port 1 View  Result Date:  08-04-17 CLINICAL DATA:  Status post right thoracentesis. EXAM: PORTABLE CHEST 1 VIEW COMPARISON:  Chest CT, 08/04/2017 at 10:22 a.m. Chest radiograph, 08/04/2017 at 7:23 a.m. FINDINGS: There has been a significant reduction in right pleural fluid following thoracentesis. There is no pneumothorax. Hazy persistent opacity in the right lung is likely due to atelectasis with some residual fluid. Left lung remains clear. IMPRESSION: 1. Significant reduction in right pleural fluid following right thoracentesis. 2. No pneumothorax. Electronically Signed   By: Amie Portland M.D.   On: 08/04/2017 16:35

## 2017-07-12 NOTE — Progress Notes (Signed)
Pharmacy - IV heparin  Assessment:    Please see note from Nolon LennertBeth Green, PharmD earlier today for full details.  Briefly, 76 y.o. male on IV heparin for Afib. Rate increased for previous low heparin level.   Most recent heparin level remains undetectable on 1300 units/hr  Plan:   Increase heparin to 1700 units/hr, 2000 unit bolus  Recheck level 8 hrs after rate change  Consider checking antithrombin levels if continues to remain undetectable with increasing heparin rates. Would anticipate some degree of AKI following cardiac arrest and heparin accumulation.  Caleb Personrew Jayzon Taras, PharmD, BCPS Pager: [phone] 07/12/2017, 3:20 PM

## 2017-07-13 ENCOUNTER — Inpatient Hospital Stay (HOSPITAL_COMMUNITY): Payer: Medicare HMO

## 2017-07-13 LAB — CBC
HCT: 29.3 % — ABNORMAL LOW (ref 39.0–52.0)
Hemoglobin: 9.2 g/dL — ABNORMAL LOW (ref 13.0–17.0)
MCH: 22.2 pg — ABNORMAL LOW (ref 26.0–34.0)
MCHC: 31.4 g/dL (ref 30.0–36.0)
MCV: 70.6 fL — AB (ref 78.0–100.0)
PLATELETS: 217 10*3/uL (ref 150–400)
RBC: 4.15 MIL/uL — AB (ref 4.22–5.81)
RDW: 18.1 % — ABNORMAL HIGH (ref 11.5–15.5)
WBC: 42.1 10*3/uL — AB (ref 4.0–10.5)

## 2017-07-13 LAB — GLUCOSE, CAPILLARY
GLUCOSE-CAPILLARY: 117 mg/dL — AB (ref 65–99)
GLUCOSE-CAPILLARY: 108 mg/dL — AB (ref 65–99)
GLUCOSE-CAPILLARY: 98 mg/dL (ref 65–99)
Glucose-Capillary: 102 mg/dL — ABNORMAL HIGH (ref 65–99)
Glucose-Capillary: 93 mg/dL (ref 65–99)
Glucose-Capillary: 116 mg/dL — ABNORMAL HIGH (ref 65–99)

## 2017-07-13 LAB — BASIC METABOLIC PANEL
ANION GAP: 7 (ref 5–15)
BUN: 62 mg/dL — ABNORMAL HIGH (ref 6–20)
CO2: 19 mmol/L — AB (ref 22–32)
Calcium: 7.5 mg/dL — ABNORMAL LOW (ref 8.9–10.3)
Chloride: 108 mmol/L (ref 101–111)
Creatinine, Ser: 2.52 mg/dL — ABNORMAL HIGH (ref 0.61–1.24)
GFR calc Af Amer: 27 mL/min — ABNORMAL LOW (ref >60)
GFR, EST NON AFRICAN AMERICAN: 23 mL/min — AB (ref >60)
GLUCOSE: 112 mg/dL — AB (ref 65–99)
POTASSIUM: 5.5 mmol/L — AB (ref 3.5–5.1)
Sodium: 134 mmol/L — ABNORMAL LOW (ref 135–145)

## 2017-07-13 LAB — MAGNESIUM
Magnesium: 2.1 mg/dL (ref 1.7–2.4)
Magnesium: 2.1 mg/dL (ref 1.7–2.4)

## 2017-07-13 LAB — HEPARIN LEVEL (UNFRACTIONATED)
HEPARIN UNFRACTIONATED: 0.36 [IU]/mL (ref 0.30–0.70)
Heparin Unfractionated: 0.15 IU/mL — ABNORMAL LOW (ref 0.30–0.70)
Heparin Unfractionated: 0.27 IU/mL — ABNORMAL LOW (ref 0.30–0.70)

## 2017-07-13 LAB — PROCALCITONIN: Procalcitonin: 5.74 ng/mL

## 2017-07-13 LAB — PROTEIN, PLEURAL OR PERITONEAL FLUID

## 2017-07-13 LAB — VANCOMYCIN, TROUGH: Vancomycin Tr: 13 ug/mL — ABNORMAL LOW (ref 15–20)

## 2017-07-13 LAB — PHOSPHORUS
Phosphorus: 5.8 mg/dL — ABNORMAL HIGH (ref 2.5–4.6)
Phosphorus: 5.5 mg/dL — ABNORMAL HIGH (ref 2.5–4.6)

## 2017-07-13 MED ORDER — FREE WATER
100.0000 mL | Freq: Four times a day (QID) | Status: DC
  Administered 2017-07-13 – 2017-07-14 (×4): 100 mL

## 2017-07-13 MED ORDER — MIDAZOLAM HCL 2 MG/2ML IJ SOLN: 1.0000 mg | INTRAMUSCULAR | Status: DC | PRN

## 2017-07-13 MED ORDER — FENTANYL BOLUS VIA INFUSION
25.0000 ug | INTRAVENOUS | Status: DC | PRN
  Administered 2017-07-14: 25 ug via INTRAVENOUS
  Filled 2017-07-13: qty 25

## 2017-07-13 MED ORDER — PRO-STAT SUGAR FREE PO LIQD: 30.0000 mL | Freq: Every day | ORAL | Status: DC

## 2017-07-13 MED ORDER — SODIUM CHLORIDE 0.9 % IV SOLN
25.0000 ug/h | INTRAVENOUS | Status: DC
  Administered 2017-07-13: 50 ug/h via INTRAVENOUS
  Administered 2017-07-14: 100 ug/h via INTRAVENOUS
  Filled 2017-07-13 (×2): qty 50

## 2017-07-13 MED ORDER — MIDAZOLAM HCL 2 MG/2ML IJ SOLN
1.0000 mg | INTRAMUSCULAR | Status: DC | PRN
  Administered 2017-07-13: 1 mg via INTRAVENOUS
  Filled 2017-07-13: qty 2

## 2017-07-13 MED ORDER — FENTANYL CITRATE (PF) 100 MCG/2ML IJ SOLN
50.0000 ug | Freq: Once | INTRAMUSCULAR | Status: AC
Start: 2017-07-13 — End: 2017-07-13
  Administered 2017-07-13: 50 ug via INTRAVENOUS

## 2017-07-13 MED ORDER — NEPRO/CARBSTEADY PO LIQD
1000.0000 mL | ORAL | Status: DC
  Administered 2017-07-13: 1000 mL
  Filled 2017-07-13 (×2): qty 1000

## 2017-07-13 MED ORDER — HEPARIN (PORCINE) IN NACL 100-0.45 UNIT/ML-% IJ SOLN
2250.0000 [IU]/h | INTRAMUSCULAR | Status: DC
  Administered 2017-07-13: 2250 [IU]/h via INTRAVENOUS
  Administered 2017-07-13: 2100 [IU]/h via INTRAVENOUS
  Administered 2017-07-14: 2250 [IU]/h via INTRAVENOUS
  Filled 2017-07-13 (×3): qty 250

## 2017-07-13 MED ORDER — DEXTROSE 5 % IV SOLN
1.0000 g | INTRAVENOUS | Status: DC
  Administered 2017-07-13: 1 g via INTRAVENOUS
  Filled 2017-07-13 (×2): qty 1

## 2017-07-13 MED ORDER — VANCOMYCIN HCL IN DEXTROSE 1-5 GM/200ML-% IV SOLN
1000.0000 mg | Freq: Once | INTRAVENOUS | Status: AC
  Administered 2017-07-13: 1000 mg via INTRAVENOUS
  Filled 2017-07-13: qty 200

## 2017-07-13 NOTE — Care Management Note (Signed)
Case Management Note  Patient Details  Name: Ashley MarinerJerald J Vidovich MRN: [id number] Date of Birth: 1941/06/28  Subjective/Objective:                  resp failure and ventilation  Action/Plan: Date: July 13, 2017 Marcelle SmilingRhonda Toddy Boyd, BSN, [place]3, [place]M [phone] Chart and notes review for patient progress and needs. Will follow for case management and discharge needs. Next review date: [id number]  Expected Discharge Date:  (UNKNOWN)               Expected Discharge Plan:  Home/Self Care  In-House Referral:     Discharge planning Services  CM Consult  Post Acute Care Choice:    Choice offered to:     DME Arranged:    DME Agency:     HH Arranged:    HH Agency:     Status of Service:  In process, will continue to follow  If discussed at Long Length of Stay Meetings, dates discussed:    Additional Comments:  Golda AcreDavis, [place] Vuncannon Lynn, RN 07/13/2017, 8:39 AM

## 2017-07-13 NOTE — Progress Notes (Signed)
[facility] PROGRESS NOTE  Date of Admission: 06/24/2017 Referring Provider: Dr. Elise BenneNanivati, ER Chief Complaint: Short of breath  HPI: 76 yo male from NH with shortness of breath.  He says this was sudden in onset.  He has not been having cough, wheeze, sputum, fever, chest pain, headache, sinus congestion, nausea, abdominal pain, or diarrhea.  He is followed by Dr. Isaiah SergeMannam in pulmonary office for COPD on home oxygen, and recurrent right pleural effusion.  He had effusion last tapped about 1 month ago.  CT chest shows increased effusion.  He was noted to have low BP in ER.  He was also noted to A fib on ECG.  He has hx of CAD s/p stent in 04/05/17, combined CHF, and valvular heart disease.  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  Past Medical History: He  has a past medical history of Acute on chronic combined systolic (congestive) and diastolic (congestive) heart failure (HCC) (04/2017), Adult failure to thrive, Anemia, Arthritis, CAD (coronary artery disease), CKD (chronic kidney disease), stage III (HCC), COPD (chronic obstructive pulmonary disease) (HCC), Critical lower limb ischemia, Dehiscence of amputation stump (HCC), Depression, Gout, Hypothyroidism, PAD (peripheral artery disease) (HCC), Physical deconditioning, Pyelonephritis (06/23/2015), and Status post foot surgery.  Subjective: Agitated overnight and sedation adjusted.  Remains on pressors.  Vital signs: BP 103/65   Pulse 83   Temp 99.5 F (37.5 C) (Axillary)   Resp 14   Ht 5\' 10"  (1.778 m)   Wt 161 lb 13.1 oz (73.4 kg)   SpO2 97%   BMI 23.22 kg/m   Hemodynamics: CVP:  [8 mmHg-54 mmHg] 13 mmHg  Vent settings: Vent Mode: PRVC FiO2 (%):  [50 %-80 %] 50 % Set Rate:  [16 bmp] 16 bmp Vt Set:  [580 mL] 580 mL PEEP:  [10 cmH20] 10 cmH20 Plateau Pressure:  [18 cmH20-24 cmH20] 22 cmH20  Intake/Output: I/O last 3 completed  shifts: In: 5862.3 [I.V.:4869; NG/GT:793.3; IV Piggyback:200] Out: 1125 [Urine:800; Emesis/NG output:325]  Physical Exam:  General - cachectic Eyes - pupils reactive ENT - ETT in place Cardiac - irregular, no murmur Chest - no wheeze, rales Abd - soft, non tender Ext - Rt BKA, Lt TMA Skin - dressing on anterior chest clean Neuro - moves extremities, not following commands  Discussion: 76 yo male with acute on chronic respiratory failure with recurrent Rt pleural effusion, new onset A fib with RVR, acute on chronic combined CHF with hx of COPD.  Has unilateral infiltrate on CXR most likely from reexpansion pulmonary edema after thoracentesis, and pneumonia.  These likely triggered respiratory leading to cardiac arrest.  Assessment/Plan:  Acute on chronic respiratory failure with hypoxia. - full vent support - f/u CXR - adjust PEEP/FiO2 to keep SpO2 > 92%  Rt sided lung infiltrate on CXR. - likely reexpansion pulmonary edema and HCAP - day 3 of abx - f/u procalcitonin  Recurrent Rt pleural effusion. - f/u pleural fluid culture and cytology from 12/24  COPD with emphysema. - scheduled BDs  New onset A fib. Respiratory leading to cardiac arrest with VF and VT. Shock likely cardiac. Acute on chronic combined CHF. CAD s/p stenting September 2018. Hx of Valvular heart disease, PAD, HLD. - continue amiodarone, ASA, plavix, lipitor - pressors to keep MAP > 65 - hold outpt coreg, lasix, lisinopril - heparin gtt per pharmacy - CVP q12h - defer cardiology assessment until mental status better  Acute metabolic encephalopathy. Peripheral neuropathy. - RASS goal 0 to -1 - hold outpt neurontin, robaxin, oxycodone  Hx of Gout, Hypothyroidism. - hold allopurinol - TSH 11.5 from 12/24 - increase synthroid to 50 mcg  Splenomegaly with hx of ETOH. - monitor clinically  Moderate protein calorie malnutrition. - tube feeds  Acute renal failure 2nd to ATN. Hyperkalemia. CKD  2. - baseline creatinine 1.06 from 04/26/17 - keep foley in - monitor urine outpt  Sacral pressure wound, present prior to admission. Anterior chest wall burn after defibrillation. - wound care  DVT prophylaxis: heparin gtt  SUP: Protonix Diet: Tube feeds Goals of care: Spoke with pt's niece (decision maker) 12/24.  Continue current medical therapies.  No CPR, no defibrillation.  He would not want long term vent support.  Will need to discuss goals of care further if he doesn't make significant improvement, particularly with renal fx and neuro status.  CC time 32 minutes  Coralyn HellingVineet Mosie Angus, MD [facility] 07/13/2017, 8:19 AM Pager:  [phone] After 7pm call: [phone]  FLOW SHEET  Cultures: Blood 12/24 >> Urine 12/24 >> Rt pleural fluid 12/24 >>   Antibiotics: Vancomycin 12/24 >>  Cefepime 12/24 >>   Studies: CT angio chest 12/24 >> Large Rt effusion, calcified pleural plaques, compressive ATX, interlobular septal thickening, centrilobular and paraseptal emphysema, 4 mm nodule LLL, marked splenomegaly Rt thoracentesis 12/24 >> 1.6 liters, glucose 76, LDH 136, WBC 215 (82%L) Abd u/s 12/24 >> cholelithiasis, hepatic steatosis, mod/severe splenomegaly, mild b/l renal cortical echogenicity Echo 07/12/17 >> EF 30 to 35%, PAS 45 mmHg, small pericardial effusion, mild MR  Events: 12/24 Admit, respiratory leading to cardiac arrest  Lines/tubes: ETT 12/24 >> Lt IJ CVL 12/24 >>  Consults:  Resolved Problems:  Labs: CMP Latest Ref Rng & Units 07/13/2017 07/12/2017 07-24-16  Glucose 65 - 99 mg/dL 284(X112(H) 324(M144(H) 010(U225(H)  BUN 6 - 20 mg/dL 72(Z62(H) 36(U49(H) 44(I42(H)  Creatinine 0.61 - 1.24 mg/dL 3.47(Q2.52(H) 2.59(D1.78(H) 6.38(V1.56(H)  Sodium 135 - 145 mmol/L 134(L) 137 133(L)  Potassium 3.5 - 5.1 mmol/L 5.5(H) 5.2(H) 5.3(H)  Chloride 101 - 111 mmol/L 108 108 106  CO2 22 - 32 mmol/L 19(L) 19(L) 20(L)  Calcium 8.9 - 10.3 mg/dL 7.5(L) 8.0(L) 7.7(L)  Total Protein 6.5 - 8.1 g/dL - -  5.5(L)  Total Bilirubin 0.3 - 1.2 mg/dL - - 1.1  Alkaline Phos 38 - 126 U/L - - 74  AST 15 - 41 U/L - - 21  ALT 17 - 63 U/L - - 12(L)    CBC Latest Ref Rng & Units 07/13/2017 07/12/2017 07-24-16  WBC 4.0 - 10.5 K/uL 42.1(H) 37.9(H) 45.0(H)  Hemoglobin 13.0 - 17.0 g/dL 5.6(E9.2(L) 10.8(L) 10.3(L)  Hematocrit 39.0 - 52.0 % 29.3(L) 34.8(L) 32.6(L)  Platelets 150 - 400 K/uL 217 251 216    CBG (last 3)  Recent Labs    07/12/17 2324 07/13/17 0318 07/13/17 0753  GLUCAP 91 117* 102*    Imaging: Dg Abd 1 View  Result Date: 07/12/2017 CLINICAL DATA:  Orogastric tube placement. EXAM: ABDOMEN - 1 VIEW COMPARISON:  Lumbar spine radiographs performed 06/23/2015 FINDINGS: The patient's enteric tube is seen ending overlying the body of the stomach, with the side port about the gastric fundus. The visualized bowel gas pattern is grossly unremarkable. No free intra-abdominal air is seen, though evaluation for free air is limited on a single supine view. No acute osseous abnormalities are identified. Mild degenerative change is noted along the lumbar spine. IMPRESSION: Enteric tube noted  ending overlying the body of the stomach, with the side port about the fundus of the stomach. Electronically Signed   By: Roanna Raider M.D.   On: 07/12/2017 06:09   Ct Angio Chest Pe W And/or Wo Contrast  Result Date: 2017-08-03 CLINICAL DATA:  76 year old male with shortness of breath EXAM: CT ANGIOGRAPHY CHEST WITH CONTRAST TECHNIQUE: Multidetector CT imaging of the chest was performed using the standard protocol during bolus administration of intravenous contrast. Multiplanar CT image reconstructions and MIPs were obtained to evaluate the vascular anatomy. CONTRAST:  80mL ISOVUE-370 IOPAMIDOL (ISOVUE-370) INJECTION 76% COMPARISON:  Most recent prior CT scan of the chest 03/17/2017 FINDINGS: Cardiovascular: No evidence of pulmonary embolus. Normal caliber thoracic aorta. No aneurysm. Extensive coronary artery  calcifications are present. The right heart, particularly the right atrium are enlarged. No pericardial effusion. Mediastinum/Nodes: Unremarkable CT appearance of the thyroid gland. No suspicious mediastinal or hilar adenopathy. No soft tissue mediastinal mass. The thoracic esophagus is unremarkable. Lungs/Pleura: Large right-sided pleural effusion with mildly convex margins. Calcified pleural plaques are present along the posteromedial aspect of the pleural space. The volume of the pleural effusion results in significant atelectasis of the right lower lobe. There is diffuse mild interlobular septal thickening in the bilateral upper lungs suggesting early interstitial edema. This is superimposed on a background of combined centrilobular and paraseptal pulmonary emphysema. A 4 mm pulmonary nodule is present in the left lower lobe (image 105 series 13). This is unchanged compared to the prior chest CT from 03/17/2017. No additional pulmonary nodules are identified. Upper Abdomen: Marked splenomegaly again noted. There is trace perihepatic ascites. Musculoskeletal: No acute fracture identified. 8 mm circumscribed lucency present within the inferior and anterior aspect of the T4 vertebral body. Stable T7 and T8 endplate deformities. Review of the MIP images confirms the above findings. IMPRESSION: 1. Progressive right-sided pleural effusion with associated progressive right lower lobe atelectasis. 2. 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 3. Stable nonspecific 8 mm lucency in the T4 vertebral body. While this may be degenerative in nature, a true lytic lesion is difficult to exclude radiographically. Does the patient have a known history of malignancy or myeloma? 4. Coronary artery calcifications. 5. Combined centrilobular and paraseptal pulmonary emphysema. 6. Coronary artery calcifications. 7. Solitary 4-5 mm left lower lobe pulmonary nodule. No follow-up  needed if patient is low-risk. Non-contrast chest CT can be considered in 12 months if patient is high-risk. This recommendation follows the consensus statement: Guidelines for Management of Incidental Pulmonary Nodules Detected on CT Images: From the Fleischner Society 2017; Radiology 2017; 284:228-243. Aortic Atherosclerosis (ICD10-I70.0) and Emphysema (ICD10-J43.9). Electronically Signed   By: Malachy Moan M.D.   On: 08/03/2017 10:48   US Abdomen Complete  Result Date: Aug 03, 2017 CLINICAL DATA:  Splenomegaly. EXAM: ABDOMEN ULTRASOUND COMPLETE COMPARISON:  Chest CT 03-Aug-2017 and 03/17/2017 FINDINGS: Gallbladder: Multiple small gallstones with the largest measuring 4.5 mm. No gallbladder wall thickening. Negative sonographic Murphy's sign. No adjacent free fluid. Common bile duct: Diameter: 2.5 mm. Liver: Minimal increased parenchymal echogenicity without focal mass. Portal vein is patent on color Doppler imaging with normal direction of blood flow towards the liver. IVC: No abnormality visualized. Pancreas: Not visualized. Spleen: Moderate to severe splenomegaly measuring 26.1 cm in greatest diameter. Volume 4087 cubic cm. Right Kidney: Length: 11.3 cm. Mild increased cortical echogenicity. No mass or hydronephrosis visualized. Left Kidney: Length: 11.0 cm. Mild increased cortical echogenicity. No mass or hydronephrosis visualized. Abdominal aorta: No  aneurysm visualized. Mild atherosclerotic disease. Other findings: Small right pleural effusion. Minimal amount of perihepatic fluid. IMPRESSION: Mild cholelithiasis without additional sonographic evidence to suggest cholecystitis. No ductal dilatation. Mild hepatic steatosis.  Small amount of perihepatic fluid. Known moderate to severe splenomegaly as described. Mild bilateral increased renal cortical echogenicity which may be due to medical renal disease. Small right pleural effusion. Electronically Signed   By: Elberta Fortis M.D.   On: 06/27/2017 15:19    Dg Chest Port 1 View  Result Date: 07/13/2017 CLINICAL DATA:  Respiratory failure.  Right pleural effusion. EXAM: PORTABLE CHEST 1 VIEW COMPARISON:  Chest x-rays dated 07/12/2017 and 07/17/2017 and CT scan dated 06/24/2017 FINDINGS: Endotracheal tube, NG tube, and central venous catheter appear in good position, unchanged. Persistent density in the right hemithorax representing and moderate right pleural effusion, diminished since the CT scan. The heart size and pulmonary vascularity are normal. Left lung is clear. IMPRESSION: No significant change since the prior chest x-ray. Persistent moderate right effusion. Electronically Signed   By: Francene Boyers M.D.   On: 07/13/2017 07:00   Dg Chest Port 1 View  Result Date: 07/12/2017 CLINICAL DATA:  Pleural effusion EXAM: PORTABLE CHEST 1 VIEW COMPARISON:  07/10/2017 FINDINGS: Support devices are stable. Right pleural effusion and diffuse right lung airspace disease again noted, not significantly changed. No confluent opacity on the left. Heart is borderline in size. IMPRESSION: Small right pleural effusion with diffuse right lung airspace disease, unchanged. Electronically Signed   By: Charlett Nose M.D.   On: 07/12/2017 07:10   Dg Chest Port 1 View  Result Date: 06/23/2017 CLINICAL DATA:  Central line and endotracheal tube placement. EXAM: PORTABLE CHEST 1 VIEW COMPARISON:  06/19/2017 at 1607 hour FINDINGS: Endotracheal tube tip projects 6 cm above the carina. Left internal jugular central venous line tip projects in the mid superior vena cava. No pneumothorax. Lung bases were excluded from the field of view. There are reticular opacities in the lungs apices associated with pleural thickening, consistent scarring. Hazy opacity is noted in the right mid lung similar to the earlier study. IMPRESSION: 1. Endotracheal tube tip projects 6 cm about the carina. 2. Left subclavian central venous line tip projects in the mid superior vena cava. No pneumothorax.  Electronically Signed   By: Amie Portland M.D.   On: 07/16/2017 19:00   Dg Chest Port 1 View  Result Date: 07/15/2017 CLINICAL DATA:  Status post right thoracentesis. EXAM: PORTABLE CHEST 1 VIEW COMPARISON:  Chest CT, 06/26/2017 at 10:22 a.m. Chest radiograph, 07/15/2017 at 7:23 a.m. FINDINGS: There has been a significant reduction in right pleural fluid following thoracentesis. There is no pneumothorax. Hazy persistent opacity in the right lung is likely due to atelectasis with some residual fluid. Left lung remains clear. IMPRESSION: 1. Significant reduction in right pleural fluid following right thoracentesis. 2. No pneumothorax. Electronically Signed   By: Amie Portland M.D.   On: 07/12/2017 16:35

## 2017-07-13 NOTE — Progress Notes (Signed)
Initial Nutrition Assessment  DOCUMENTATION CODES:   Not applicable  INTERVENTION:  - Will order Nepro @ 40 mL/hr with 30 mL Prostat once/day. This regimen will provide 1828 kcal, 93 grams of protein, and 698 mL free water.  - Will order 100 mL free water QID (100 mL/day). - Will continue to monitor K and Phos labs and adjust TF as warranted.   NUTRITION DIAGNOSIS:   Inadequate oral intake related to inability to eat as evidenced by NPO status.  GOAL:   Patient will meet greater than or equal to 90% of their needs  MONITOR:   Vent status, TF tolerance, Weight trends, Labs, I & O's  REASON FOR ASSESSMENT:   Ventilator, Consult Enteral/tube feeding initiation and management  ASSESSMENT:   76 year old male with history of COPD on 2 L of oxygen at home, CKD, hypothyroidism, recurrent right pleural effusion, combined systolic and diastolic CHF, coronary artery disease, history of peripheral artery disease with right BKA who presents with shortness of breath.  Patient reports that he has been having shortness of breath over the last year, has had pleural effusions that has required intermittent drainage, and believes that is what caused it is causing his shortness of breath today (12/24).  Reports that he has chronic dyspnea, however it has been worsening since yesterday (12/23)   BMI indicates normal weight. OGT in place and abdominal x-ray yesterday showed the tube in the fundus of the stomach. Pt currently receiving TF regimen per TF protocol: Vital High Protein @ 40 mL/hr with 30 mL Prostat BID. This regimen is providing 1160 kcal, 114 grams of protein, and 802 mL free water. Will adjust TF regimen as outlined above.   No family/visitors present at this time. MD note states moderate PCM. Unable to identify malnutrition per ASPEN guidelines based on information currently available but will continue to monitor. Noted several weight fluctuation since May 2018; will monitor weight trends  closely during hospitalization.   Per Dr. Evlyn CourierSood's note this AM, R-sided lung infiltrate seen on chest x-ray which is thought to be pulmonary edema and HCAP, recurrent R PE.  Patient is currently intubated on ventilator support MV: 7 L/min Temp (24hrs), Avg:100.2 F (37.9 C), Min:99.5 F (37.5 C), Max:100.7 F (38.2 C) Propofol: none BP: 99/55 and MAP: 67  Medications reviewed; 50 mcg Synthroid per OGT/day, 40 mg Protonix per OGT/day.  Labs reviewed; CBGs: 117 and 102 mg/dL this AM, Na: 454134 mmol/L, K: 5.5 mmol/L, BUN: 62 mg/dL, creatinine: 0.982.52 mg/dL, Ca: 7.5 mg/dL, Phos: 5.8 mg/dL, GFR: 23 mL/min.   IVF: NS @ 40 mL/hr. Drips: Heparin @ 2100 units/hr, Levo @ 6 mcg/min, Fentanyl @ 100 mcg/hr.      NUTRITION - FOCUSED PHYSICAL EXAM:    Most Recent Value  Orbital Region  Unable to assess  Upper Arm Region  No depletion  Thoracic and Lumbar Region  Unable to assess  Buccal Region  Unable to assess  Temple Region  Mild depletion  Clavicle Bone Region  Mild depletion  Clavicle and Acromion Bone Region  Mild depletion  Scapular Bone Region  Unable to assess  Dorsal Hand  No depletion  Patellar Region  Mild depletion  Anterior Thigh Region  Moderate depletion  Posterior Calf Region  Moderate depletion  Edema (RD Assessment)  None  Hair  Reviewed  Eyes  Unable to assess  Mouth  Unable to assess  Skin  Reviewed  Nails  Reviewed       Diet Order:  Diet NPO  time specified  EDUCATION NEEDS:   No education needs have been identified at this time  Skin:  Skin Assessment: Skin Integrity Issues: Skin Integrity Issues:: Stage I, Other (Comment) Stage I: sacrum Other: R BKA  Last BM:  PTA/unknown  Height:   Ht Readings from Last 1 Encounters:  07/13/2017 5\' 10"  (1.778 m)    Weight:   Wt Readings from Last 1 Encounters:  07/13/17 161 lb 13.1 oz (73.4 kg)    Ideal Body Weight:  75.45 kg  BMI:  Body mass index is 23.22 kg/m.  Estimated Nutritional Needs:   Kcal:   1806  Protein:  88-110 grams (1.2-1.5 grams/kg)  Fluid:  >/= 1.8 L/day     Trenton GammonJessica Ta Fair, MS, RD, LDN, [facility] Inpatient Clinical Dietitian Pager # [phone] After hours/weekend pager # [phone]

## 2017-07-13 NOTE — Progress Notes (Signed)
ANTICOAGULATION CONSULT NOTE  Pharmacy Consult for IV heparin Indication: atrial fibrillation  No Known Allergies  Patient Measurements: Height: _0  (177.8 cm) Weight: 164 lb 0.4 oz (74.4 kg) IBW/kg (Calculated) : 73 Heparin Dosing Weight: TBW  Vital Signs: Temp: 100.7 F (38.2 C) (12/25 2307) Temp Source: Axillary (12/25 2307) BP: 106/91 (12/26 0110) Pulse Rate: 80 (12/26 0110)  Labs: Recent Labs    07/02/2017 0740 07/16/2017 0828 07/13/2017 1930 07/12/17 0323 07/12/17 1345 07/13/17 0100  HGB 9.3*  --  10.3* 10.8*  --   --   HCT 30.2*  --  32.6* 34.8*  --   --   PLT 151  --  216 251  --   --   APTT 31  --   --   --   --   --   LABPROT 15.4*  --   --   --   --   --   INR 1.23  --   --   --   --   --   HEPARINUNFRC  --   --   --  <0.10* <0.10* 0.15*  CREATININE  --  1.43* 1.56* 1.78*  --   --   TROPONINI  --  <0.03  --   --   --   --     Estimated Creatinine Clearance: 37 mL/min (A) (by C-G formula based on SCr of 1.78 mg/dL (H)).   Medical History: Past Medical History:  Diagnosis Date  . Acute on chronic combined systolic (congestive) and diastolic (congestive) heart failure ([place]) 04/2017  . Adult failure to thrive    /notes 06/23/2015  . Anemia   . Arthritis    "hands, fingers" (11/10/2016  . CAD (coronary artery disease)    a. 03/2017: multivessel CAD and not felt to be a CABG candidate, s/p DESx2 to LCx and OM1.   . CKD (chronic kidney disease), stage III ([place])   . COPD (chronic obstructive pulmonary disease) (HCC)    LONG TIME SMOKER  . Critical lower limb ischemia   . 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 Depression    "periods of depression" (06/23/2015)  . Gout   . Hypothyroidism   . PAD (peripheral artery disease) ([place])   . Physical deconditioning   . Pyelonephritis 06/23/2015  . Status post foot surgery    right fifth toe amputation by Dr. Sharol Given     Medications:  Medications Prior to Admission  Medication Sig  Dispense Refill Last Dose  . albuterol (PROVENTIL HFA;VENTOLIN HFA) 108 (90 BASE) MCG/ACT inhaler Inhale 2 puffs into the lungs 2 (two) times daily. 0900 & 1700   07/10/2017 at Unknown time  . allopurinol (ZYLOPRIM) 100 MG tablet Take 100 mg by mouth daily.    07/10/2017 at Unknown time  . aspirin 81 MG chewable tablet Chew 81 mg by mouth daily.   07/10/2017 at Unknown time  . atorvastatin (LIPITOR) 20 MG tablet Take 20 mg by mouth daily at 6 PM.   07/10/2017 at Unknown time  . carvedilol (COREG) 3.125 MG tablet Take 1 tablet (3.125 mg total) by mouth 2 (two) times daily. 60 tablet 0 07/10/2017 at 2000  . cholecalciferol (VITAMIN D) 1000 units tablet Take 1,000 Units by mouth daily. (0900)   07/10/2017 at Unknown time  . clopidogrel (PLAVIX) 75 MG tablet Take 1 tablet (75 mg total) by mouth daily with breakfast. 30 tablet 6 07/10/2017 at Unknown time  . ferrous sulfate  325 (65 FE) MG tablet Take 1 tablet (325 mg total) by mouth 2 (two) times daily with a meal. 60 tablet 0 07/10/2017 at Unknown time  . furosemide (LASIX) 40 MG tablet Take 1 tablet (40 mg total) by mouth 2 (two) times daily. (Patient taking differently: Take 40 mg by mouth daily. ) 30 tablet  07/10/2017 at Unknown time  . gabapentin (NEURONTIN) 100 MG capsule Take 200 mg by mouth at bedtime.    07/10/2017 at Unknown time  . guaiFENesin (MUCINEX) 600 MG 12 hr tablet Take 1,200 mg by mouth 2 (two) times daily as needed for cough.   unknown  . ipratropium-albuterol (DUONEB) 0.5-2.5 (3) MG/3ML SOLN Take 3 mLs by nebulization 4 (four) times daily as needed (for shortness of breath).   07/12/2017 at Unknown time  . levothyroxine (SYNTHROID, LEVOTHROID) 25 MCG tablet Take 1 tablet (25 mcg total) by mouth daily at 6 (six) AM.   07/10/2017 at Unknown time  . lisinopril (PRINIVIL,ZESTRIL) 5 MG tablet Take 1 tablet (5 mg total) by mouth daily. 30 tablet 0 07/10/2017 at Unknown time  . meloxicam (MOBIC) 7.5 MG tablet Take 7.5 mg by mouth daily.     07/10/2017 at Unknown time  . methocarbamol (ROBAXIN) 500 MG tablet Take 500 mg by mouth every 6 (six) hours as needed for muscle spasms.    unknown  . Multiple Vitamins-Minerals (MULTIVITAMIN WITH MINERALS) tablet Take 1 tablet by mouth daily. (0900)   07/10/2017 at Unknown time  . omeprazole (PRILOSEC) 20 MG capsule Take 20 mg by mouth daily at 6 (six) AM.    07/10/2017 at Unknown time  . oxyCODONE (OXY IR/ROXICODONE) 5 MG immediate release tablet Take 1-2 tablets (5-10 mg total) by mouth every 4 (four) hours as needed (45m for moderate pain, 157mfor severe pain). 10 tablet 0 unknown  . potassium chloride SA (K-DUR,KLOR-CON) 20 MEQ tablet Take 1 tablet (20 mEq total) by mouth 2 (two) times daily. (Patient taking differently: Take 20 mEq by mouth daily. )   07/10/2017 at Unknown time  . saccharomyces boulardii (FLORASTOR) 250 MG capsule Take 250 mg by mouth 2 (two) times daily.   07/10/2017 at Unknown time  . traMADol (ULTRAM) 50 MG tablet Take 1 tablet (50 mg total) by mouth every 6 (six) hours as needed for moderate pain. 20 tablet 0 unknown  . traZODone (DESYREL) 50 MG tablet Take 50 mg by mouth at bedtime.    07/10/2017 at Unknown time   Scheduled:  . amiodarone  200 mg Per Tube Daily  . aspirin  81 mg Per Tube Daily  . atorvastatin  20 mg Per Tube q1800  . chlorhexidine gluconate (MEDLINE KIT)  15 mL Mouth Rinse BID  . Chlorhexidine Gluconate Cloth  6 each Topical Q0600  . clopidogrel  75 mg Per Tube Q breakfast  . feeding supplement (PRO-STAT SUGAR FREE 64)  30 mL Per Tube BID  . feeding supplement (VITAL HIGH PROTEIN)  1,000 mL Per Tube Q24H  . ipratropium-albuterol  3 mL Nebulization Q6H  . levothyroxine  50 mcg Per Tube QAC breakfast  . mouth rinse  15 mL Mouth Rinse QID  . mupirocin ointment  1 application Nasal BID  . pantoprazole sodium  40 mg Per Tube Q24H   Infusions:  . sodium chloride 40 mL/hr at 07/13/17 0100  . ceFEPime (MAXIPIME) IV Stopped (07/12/17 1404)  . heparin     . norepinephrine 14 mcg/min (07/12/17 2332)  . vancomycin Stopped (07/12/17 1434)  Assessment: 65 yoM from NH, with PMH COPD, combined CHF, CAD s/p stenting this September, CKD, R BKA, hypothyroid, EtOH abuse, valvular Dz and questionable hepatic dz. presenting with SOB and worsening pleural effusion requiring R thoracentesis. Afib noted in ER, and pharmacy consulted to dose IV heparin starting 6 hrs after thoracentesis   Baseline INR, aPTT: WNL  Prior anticoagulation: none, on ASA/Plavix PTA for recent stenting  Significant events:  12/24: Thoracentesis performed approx 4pm  12/24: Patient suffered cardiac arrest ~6pm with ROSC achieved; proceed with heparin per CCM  12/24  CBC: Hgb low on admission (anemic at baseline); Plt borderline low  No bleeding or infusion issues per nursing  CrCl: 45 ml/min; suspect will drop after cardiac arrest Today, 12/26  0100 HL=0.15 below goal, no bleeding or infusion issues per RN.    Goal of Therapy: Heparin level 0.3-0.7 units/ml Monitor platelets by anticoagulation protocol: Yes  Plan:   Increase heparin drip to 2100 units/hr  Recheck HL in 8 hours  Daily CBC, daily heparin level once stable  Dorrene German 07/13/2017, 1:33 AM

## 2017-07-13 NOTE — Progress Notes (Signed)
ANTICOAGULATION CONSULT NOTE  Pharmacy Consult for IV heparin Indication: atrial fibrillation  No Known Allergies  Patient Measurements: Height: 5\' 10"  (177.8 cm) Weight: 161 lb 13.1 oz (73.4 kg) IBW/kg (Calculated) : 73 Heparin Dosing Weight: TBW  Vital Signs: Temp: 99.5 F (37.5 C) (12/26 0321) Temp Source: Axillary (12/26 0321) BP: 103/65 (12/26 0700) Pulse Rate: 83 (12/26 0700)  Labs: Recent Labs    2016/10/29 0740  2016/10/29 0828 2016/10/29 1930 07/12/17 0323 07/12/17 1345 07/13/17 0100 07/13/17 0412 07/13/17 0425  HGB 9.3*  --   --  10.3* 10.8*  --   --   --  9.2*  HCT 30.2*  --   --  32.6* 34.8*  --   --   --  29.3*  PLT 151  --   --  216 251  --   --   --  217  APTT 31  --   --   --   --   --   --   --   --   LABPROT 15.4*  --   --   --   --   --   --   --   --   INR 1.23  --   --   --   --   --   --   --   --   HEPARINUNFRC  --   --   --   --  <0.10* <0.10* 0.15*  --   --   CREATININE  --    < > 1.43* 1.56* 1.78*  --   --  2.52*  --   TROPONINI  --   --  <0.03  --   --   --   --   --   --    < > = values in this interval not displayed.    Estimated Creatinine Clearance: 26.2 mL/min (A) (by C-G formula based on SCr of 2.52 mg/dL (H)).   Medical History: Past Medical History:  Diagnosis Date  . Acute on chronic combined systolic (congestive) and diastolic (congestive) heart failure ([facility]) 04/2017  . Adult failure to thrive    /notes 06/23/2015  . Anemia   . Arthritis    "hands, fingers" (11/10/2016  . CAD (coronary artery disease)    a. 03/2017: multivessel CAD and not felt to be a CABG candidate, s/p DESx2 to LCx and OM1.   . CKD (chronic kidney disease), stage III (H[facility])   . COPD (chronic obstructive pulmonary disease) ([facility])    LONG TIME SMOKER  . Critical lower limb ischemia   . 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 Depression    "periods of depression" (06/23/2015)  . Gout   . Hypothyroidism   . PAD (peripheral  artery disease) ([facility])   . Physical deconditioning   . Pyelonephritis 06/23/2015  . Status post foot surgery    right fifth toe amputation by Dr. Lajoyce Cornersuda     Medications:  Infusions:  . sodium chloride 40 mL/hr at 07/13/17 0328  . ceFEPime (MAXIPIME) IV    . fentaNYL infusion INTRAVENOUS 75 mcg/hr (07/13/17 0717)  . heparin 2,100 Units/hr (07/13/17 0326)  . norepinephrine 12 mcg/min (07/13/17 0740)    Assessment: 76 yoM from NH, with PMH COPD, combined CHF, CAD s/p stenting this September, CKD, R BKA, hypothyroid, EtOH abuse, valvular Dz and questionable hepatic dz. presenting with SOB and worsening pleural effusion requiring R thoracentesis. Afib noted in ER, and pharmacy consulted  to dose IV heparin starting 6 hrs after thoracentesis   Baseline INR, aPTT: WNL  CBC: Hgb low on admission (anemic at baseline); Plt borderline low  Prior anticoagulation: none, on ASA/Plavix PTA for recent stenting  Significant events:  12/24: Thoracentesis performed approx 4pm  12/24: Patient suffered cardiac arrest ~6pm with ROSC achieved; proceed with heparin per CCM  Today, 12/26  Heparin sub-therapeutic (HL = 0.27) on 2100 units/hr  No bleeding or infusion issues reported by RN  CBC: pltc WNL, Hg low but stable  Goal of Therapy: Heparin level 0.3-0.7 units/ml Monitor platelets by anticoagulation protocol: Yes  Plan:  Increase heparin drip to 2250 units/hr  Recheck HL in 8 hours  Daily CBC, daily heparin level once stable  Elson ClanLilliston, Tammye Kahler Michelle 07/13/2017, 8:06 AM

## 2017-07-13 NOTE — Progress Notes (Signed)
ANTICOAGULATION CONSULT NOTE  Pharmacy Consult for IV heparin Indication: atrial fibrillation  No Known Allergies  Patient Measurements: Height: 5\' 10"  (177.8 cm) Weight: 161 lb 13.1 oz (73.4 kg) IBW/kg (Calculated) : 73 Heparin Dosing Weight: TBW  Vital Signs: Temp: 102.1 F (38.9 C) (12/26 2130) Temp Source: Oral (12/26 2130) BP: 102/47 (12/26 2130) Pulse Rate: 88 (12/26 2130)  Labs: Recent Labs    07/09/2017 0740  07/04/2017 0828 06/22/2017 1930 07/12/17 0323  07/13/17 0100 07/13/17 0412 07/13/17 0425 07/13/17 1202 07/13/17 2043  HGB 9.3*  --   --  10.3* 10.8*  --   --   --  9.2*  --   --   HCT 30.2*  --   --  32.6* 34.8*  --   --   --  29.3*  --   --   PLT 151  --   --  216 251  --   --   --  217  --   --   APTT 31  --   --   --   --   --   --   --   --   --   --   LABPROT 15.4*  --   --   --   --   --   --   --   --   --   --   INR 1.23  --   --   --   --   --   --   --   --   --   --   HEPARINUNFRC  --   --   --   --  <0.10*   < > 0.15*  --   --  0.27* 0.36  CREATININE  --    < > 1.43* 1.56* 1.78*  --   --  2.52*  --   --   --   TROPONINI  --   --  <0.03  --   --   --   --   --   --   --   --    < > = values in this interval not displayed.    Estimated Creatinine Clearance: 26.2 mL/min (A) (by C-G formula based on SCr of 2.52 mg/dL (H)).  Assessment: [age] yoM from NH, with PMH COPD, combined CHF, CAD s/p stenting this September, CKD, R BKA, hypothyroid, EtOH abuse, valvular Dz and questionable hepatic dz. presenting with SOB and worsening pleural effusion requiring R thoracentesis. Afib noted in ER, and pharmacy consulted to dose IV heparin starting 6 hrs after thoracentesis   Baseline INR, aPTT: WNL  CBC: Hgb low on admission (anemic at baseline); Plt borderline low  Prior anticoagulation: none, on ASA/Plavix PTA for recent stenting  Significant events:  12/24: Thoracentesis performed approx 4pm  12/24: Patient suffered cardiac arrest ~6pm with ROSC  achieved; proceed with heparin per CCM  Today, 12/26  Heparin therapeutic (HL = 0.36) after rate increased to 2250 units/hr  No bleeding or infusion issues reported by RN  CBC: pltc WNL, Hg low but stable  Goal of Therapy: Heparin level 0.3-0.7 units/ml Monitor platelets by anticoagulation protocol: Yes  Plan:  continue heparin drip at 2250 units/hr  daily CBC, daily heparin level   Herby AbrahamMichelle T. Shavonta Gossen, Pharm.D. [phone] 07/13/2017 9:42 PM

## 2017-07-13 NOTE — Progress Notes (Signed)
eLink Physician-Brief Progress Note Patient Name: Caleb MarinerJerald J Hays DOB: [birth year] MRN: [id number]   Date of Service  07/13/2017  HPI/Events of Note  Current sedation is not holding the patient.  He is biting the ETT setting off vent alarms.  eICU Interventions  Plan: Cont fentanyl sedation PRN versed RASS -1     Intervention Category Major Interventions: Delirium, psychosis, severe agitation - evaluation and management  DETERDING,ELIZABETH 07/13/2017, 5:13 AM

## 2017-07-13 NOTE — Progress Notes (Signed)
Pharmacy Antibiotic Note  Caleb MarinerJerald J Hays is a 76 y.o. male admitted on 12-01-16 with new onset Afib & Rt pleural effusion, infiltrate.  Initial antibiotic doses were given in ED 12/24 then stopped on admission.   Procalcitonin was elevated today so pharmacy has been consulted for Vanc & Cefepime dosing. 07/13/2017:   Procalcitonin 3.62>>5.74  Tm 100.7  Leukocytosis (WBC 15.2>>45>>37.9>>42.1)  Renal function worsening (Scr 1.43>>1.56>>1.78>>2.52); 24 I/O= +3.9L  Vancomycin random level =13  Plan:  Decrease Cefepime 1gm IV q24h  Discontinue Vancomycin standing dose.  Re-dose Vancomycin 1gm IV x1 today.  Re-check Vancomycin random level on 12/28 AM   Monitor renal function, cx data  Height: 5\' 10"  (177.8 cm) Weight: 161 lb 13.1 oz (73.4 kg) IBW/kg (Calculated) : 73  Temp (24hrs), Avg:100.2 F (37.9 C), Min:99.5 F (37.5 C), Max:100.7 F (38.2 C)  Recent Labs  Lab Feb 07, 2017 0740 Feb 07, 2017 0749 Feb 07, 2017 0828 Feb 07, 2017 1930 07/12/17 0323 07/13/17 0412 07/13/17 0425  WBC 15.2*  --   --  45.0* 37.9*  --  42.1*  CREATININE  --   --  1.43* 1.56* 1.78* 2.52*  --   LATICACIDVEN  --  1.26  --   --   --   --   --     Estimated Creatinine Clearance: 26.2 mL/min (A) (by C-G formula based on SCr of 2.52 mg/dL (H)).    No Known Allergies  Antimicrobials this admission: 12/24 Cefepime >> 12/24 Vanc >>   Dose adjustments this admission: 12/26 Decrease Cefepime 1gm IV q24h for CrCl<30  Microbiology results: 12/24 BCx: NGTD 12/24 UCx: NG-F  12/24 Pleural fluid: NGTD 12/24 MRSA PCR: Positive  Thank you for allowing pharmacy to be a part of this patient's care.  Caleb ClanLilliston, Caleb Hays 07/13/2017 8:02 AM

## 2017-07-14 ENCOUNTER — Inpatient Hospital Stay (HOSPITAL_COMMUNITY): Payer: Medicare HMO

## 2017-07-14 LAB — CBC
HCT: 28.9 % — ABNORMAL LOW (ref 39.0–52.0)
HEMOGLOBIN: 9.3 g/dL — AB (ref 13.0–17.0)
MCH: 22.6 pg — ABNORMAL LOW (ref 26.0–34.0)
MCHC: 32.2 g/dL (ref 30.0–36.0)
MCV: 70.1 fL — ABNORMAL LOW (ref 78.0–100.0)
PLATELETS: 177 10*3/uL (ref 150–400)
RBC: 4.12 MIL/uL — ABNORMAL LOW (ref 4.22–5.81)
RDW: 18.6 % — ABNORMAL HIGH (ref 11.5–15.5)
WBC: 37.9 10*3/uL — ABNORMAL HIGH (ref 4.0–10.5)

## 2017-07-14 LAB — BASIC METABOLIC PANEL
Anion gap: 8 (ref 5–15)
BUN: 71 mg/dL — AB (ref 6–20)
CALCIUM: 7.5 mg/dL — AB (ref 8.9–10.3)
CO2: 18 mmol/L — ABNORMAL LOW (ref 22–32)
CREATININE: 2.89 mg/dL — AB (ref 0.61–1.24)
Chloride: 106 mmol/L (ref 101–111)
GFR calc Af Amer: 23 mL/min — ABNORMAL LOW (ref >60)
GFR, EST NON AFRICAN AMERICAN: 20 mL/min — AB (ref >60)
Glucose, Bld: 136 mg/dL — ABNORMAL HIGH (ref 65–99)
POTASSIUM: 5.4 mmol/L — AB (ref 3.5–5.1)
SODIUM: 132 mmol/L — AB (ref 135–145)

## 2017-07-14 LAB — PROCALCITONIN: PROCALCITONIN: 8.51 ng/mL

## 2017-07-14 LAB — GLUCOSE, CAPILLARY
GLUCOSE-CAPILLARY: 120 mg/dL — AB (ref 65–99)
GLUCOSE-CAPILLARY: 127 mg/dL — AB (ref 65–99)

## 2017-07-14 LAB — HEPARIN LEVEL (UNFRACTIONATED): Heparin Unfractionated: 0.33 IU/mL (ref 0.30–0.70)

## 2017-07-15 ENCOUNTER — Telehealth: Payer: Self-pay

## 2017-07-15 LAB — BODY FLUID CULTURE: Culture: NO GROWTH

## 2017-07-15 NOTE — Telephone Encounter (Signed)
On 07/15/17 I [facility] The d/c is for cremation. The patient is a patient of Doctor Sood. [facility].  On 07/20/17 I received the d/c back from [facility]. I got the d/c ready and called the funeral home to let them know the d/c is ready for pickup.

## 2017-07-16 LAB — CULTURE, BLOOD (ROUTINE X 2)
CULTURE: NO GROWTH
Culture: NO GROWTH
Special Requests: ADEQUATE
Special Requests: ADEQUATE

## 2017-07-19 NOTE — Progress Notes (Signed)
ANTICOAGULATION CONSULT NOTE  Pharmacy Consult for IV heparin Indication: atrial fibrillation  No Known Allergies  Patient Measurements: Height: 5\' 10"  (177.8 cm) Weight: 165 lb 5.5 oz (75 kg) IBW/kg (Calculated) : 73 Heparin Dosing Weight: TBW  Vital Signs: Temp: 98.7 F (37.1 C) (12/27 0304) Temp Source: Oral (12/27 0304) BP: 124/50 (12/27 0600) Pulse Rate: 80 (12/27 0600)  Labs: Recent Labs    2017-02-28 0740 2017-02-28 0828  07/12/17 0323  07/13/17 0412 07/13/17 0425 07/13/17 1202 07/13/17 2043 07/13/2017 0500  HGB 9.3*  --    < > 10.8*  --   --  9.2*  --   --  9.3*  HCT 30.2*  --    < > 34.8*  --   --  29.3*  --   --  28.9*  PLT 151  --    < > 251  --   --  217  --   --  177  APTT 31  --   --   --   --   --   --   --   --   --   LABPROT 15.4*  --   --   --   --   --   --   --   --   --   INR 1.23  --   --   --   --   --   --   --   --   --   HEPARINUNFRC  --   --   --  <0.10*   < >  --   --  0.27* 0.36 0.33  CREATININE  --  1.43*   < > 1.78*  --  2.52*  --   --   --  2.89*  TROPONINI  --  <0.03  --   --   --   --   --   --   --   --    < > = values in this interval not displayed.    Estimated Creatinine Clearance: 22.8 mL/min (A) (by C-G formula based on SCr of 2.89 mg/dL (H)).  Medications:  Infusions:  . sodium chloride 40 mL/hr at 06/27/2017 0301  . ceFEPime (MAXIPIME) IV Stopped (07/13/17 1340)  . fentaNYL infusion INTRAVENOUS 75 mcg/hr (07/13/17 2100)  . heparin 2,250 Units/hr (06/29/2017 0129)  . norepinephrine 18 mcg/min (07/02/2017 0314)    Assessment: 76 yoM from NH, with PMH COPD, combined CHF, CAD s/p stenting this September, CKD, R BKA, hypothyroid, EtOH abuse, valvular Dz and questionable hepatic dz. presenting with SOB and worsening pleural effusion requiring R thoracentesis. Afib noted in ER, and pharmacy consulted to dose IV heparin starting 6 hrs after thoracentesis   Baseline INR, aPTT: WNL  CBC: Hgb low on admission (anemic at baseline); Plt  borderline low  Prior anticoagulation: none, on ASA/Plavix PTA for recent stenting  Significant events:  12/24: Thoracentesis performed approx 4pm  12/24: Patient suffered cardiac arrest ~6pm with ROSC achieved; proceed with heparin per CCM  Today, 07/09/2017:  Heparin level therapeutic (HL = 0.33) on 2250 units/hr  No bleeding or infusion issues reported by RN  CBC: pltc WNL, Hg low but stable  Goal of Therapy: Heparin level 0.3-0.7 units/ml Monitor platelets by anticoagulation protocol: Yes  Plan:  Increase heparin drip to 2250 units/hr  Recheck HL in 8 hours to ensure therapeutic level  Daily CBC, daily heparin level once stable  Lynann Beaverhristine Ailene Royal PharmD, BCPS Pager [phone] 06/28/2017 7:05 AM

## 2017-07-19 NOTE — Progress Notes (Signed)
240 cc fentanyl wasted in sink. Romeo RabonMichell Catalan, RN witness to waste.

## 2017-07-19 NOTE — Progress Notes (Signed)
Called to pt's room.  He developed VT and then VF.  He is DNR.  Transitioned to asystole.  Informed pt's niece (next of kin) about pt expiring.  Coralyn HellingVineet Rennie Rouch, MD [facility] Jun 15, 2017, 8:41 AM Pager:  [phone] After 3pm call: [phone]

## 2017-07-19 NOTE — Progress Notes (Signed)
Called E Link spoke with MD  Aware pt running temp with max temp being 102.7  Gave prn tylenol and had to place ice packs in groin to get temp down.  At this time temp 100.7  No telephone orders at this time.

## 2017-07-19 NOTE — Death Summary Note (Signed)
Caleb DuboisJerald J Hays was a 77 year old male former smoker from brought from nursing home with shortness of breath.  He says this was sudden in onset.  He has not been having cough, wheeze, sputum, fever, chest pain, headache, sinus congestion, nausea, abdominal pain, or diarrhea.  He has a history of COPD on home oxygen, recurrent right pleural effusion.  He has CAD and had stenting done in September 2018.  He was noted to have A fib.  He had thoracentesis done.  He developed respiratory arrest leading to cardiac arrest.  He was initially resuscitated.  He was treated with Abx for HCAP, and amiodarone for a fib.  Status d/w his family.  Decision made for DNR status.  He developed recurrent VT and VR on Oct 28, 2016 and expired.  Cause of Death: Ventricular arrhythmia in setting of coronary artery disease  Final Diagnoses: Acute on chronic respiratory failure with hypoxia. Rt sided lung infiltrate on CXR. Recurrent Rt pleural effusion, transudate. COPD with emphysema. New onset A fib. Respiratory leading to cardiac arrest with VF and VT. Shock likely cardiac. Acute on chronic combined CHF. CAD s/p stenting September 2018. Hx of Valvular heart disease, PAD, HLD. Acute metabolic encephalopathy. Peripheral neuropathy. Hx of Gout, Hypothyroidism. Splenomegaly with hx of ETOH. Moderate protein calorie malnutrition. Acute renal failure 2nd to ATN. Hyperkalemia. CKD 2. Sacral pressure wound, present prior to admission. Anterior chest wall burn after defibrillation.   Coralyn HellingVineet Algernon Mundie, MD [facility]Critical [facility] October 04, 2016, 8:42 AM

## 2017-07-19 DEATH — deceased

## 2017-07-27 ENCOUNTER — Ambulatory Visit: Payer: Medicare HMO | Admitting: Cardiovascular Disease

## 2018-04-30 IMAGING — DX DG CHEST 1V
2 series · 2 of 2 positions shown · non-contrast
Comparison: Prior CT from 03/17/2017.

CLINICAL DATA: Follow-up examination status post right-sided
thoracentesis.

EXAM:
CHEST 1 VIEW

[chest ap (1 of 2)]
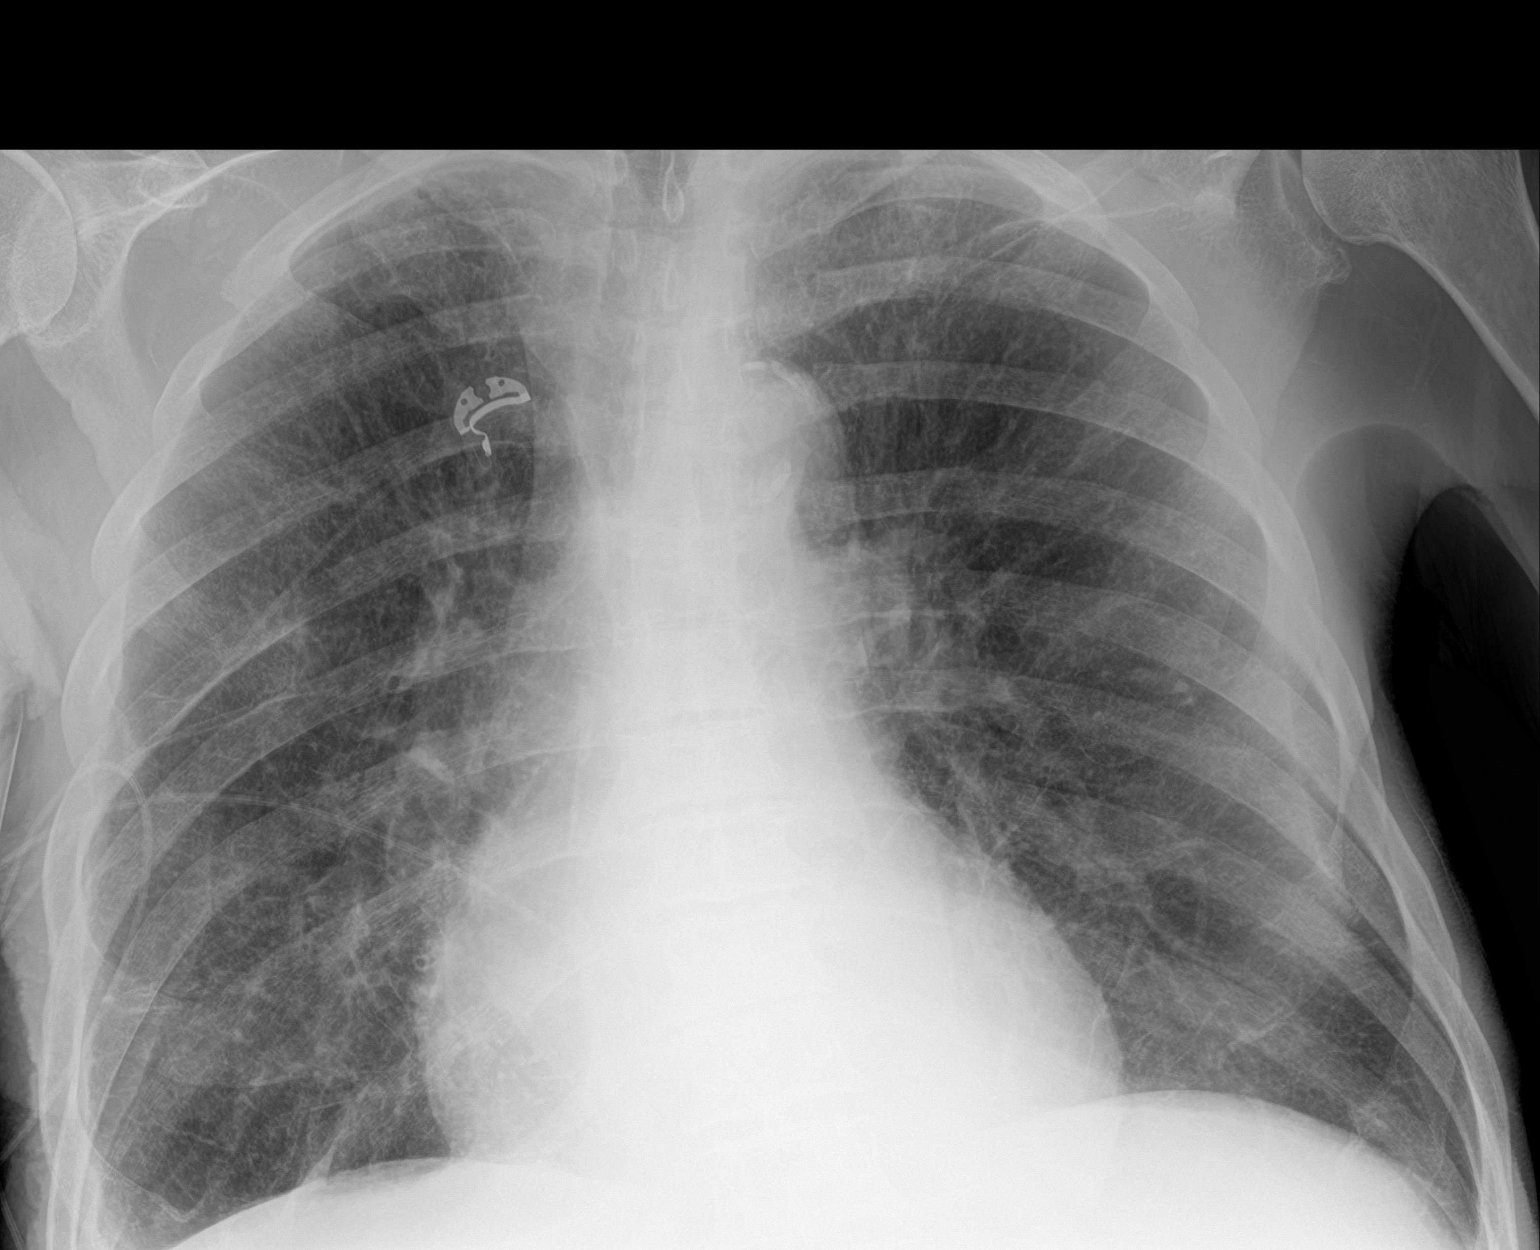

[chest ap (2 of 2)]
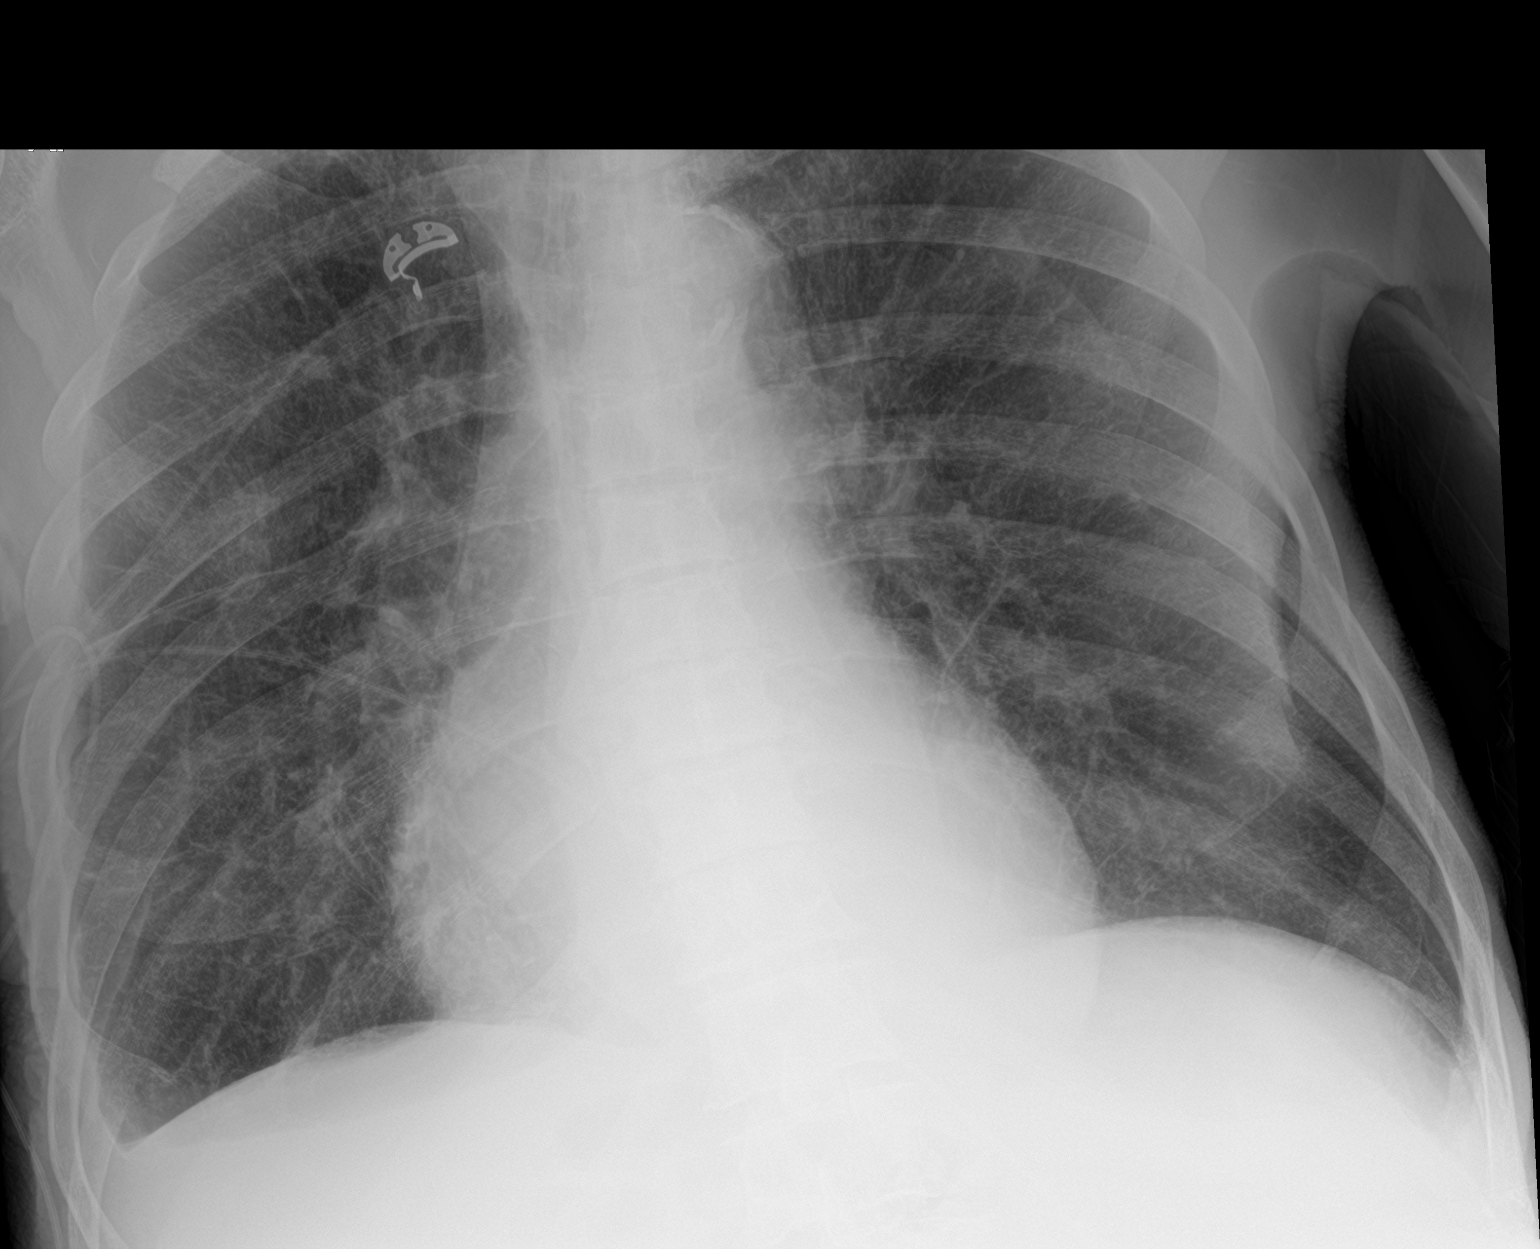

[2 of 2 positions shown; findings below may reference images not displayed]

FINDINGS: Stable cardiomegaly. Mediastinal silhouette within normal limits.
Aortic atherosclerosis noted.

Lungs normally inflated. Small residual right-sided pleural
effusion, improved from previous. No pneumothorax. No new focal
infiltrates. Diffuse interstitial prominence without frank pulmonary
edema.

Osseous structures unchanged.
IMPRESSION: 1. Small residual right pleural effusion status post thoracentesis,
improved from previous. No pneumothorax.
2. Stable cardiomegaly with aortic atherosclerosis.

## 2018-06-02 IMAGING — DX DG CHEST 2V
3 series · 3 of 3 positions shown · non-contrast
Comparison: 03/31/2017

CLINICAL DATA: Recurrent pleural effusion. History pneumonia. No
chest pain.

EXAM:
CHEST  2 VIEW

[chest lat]
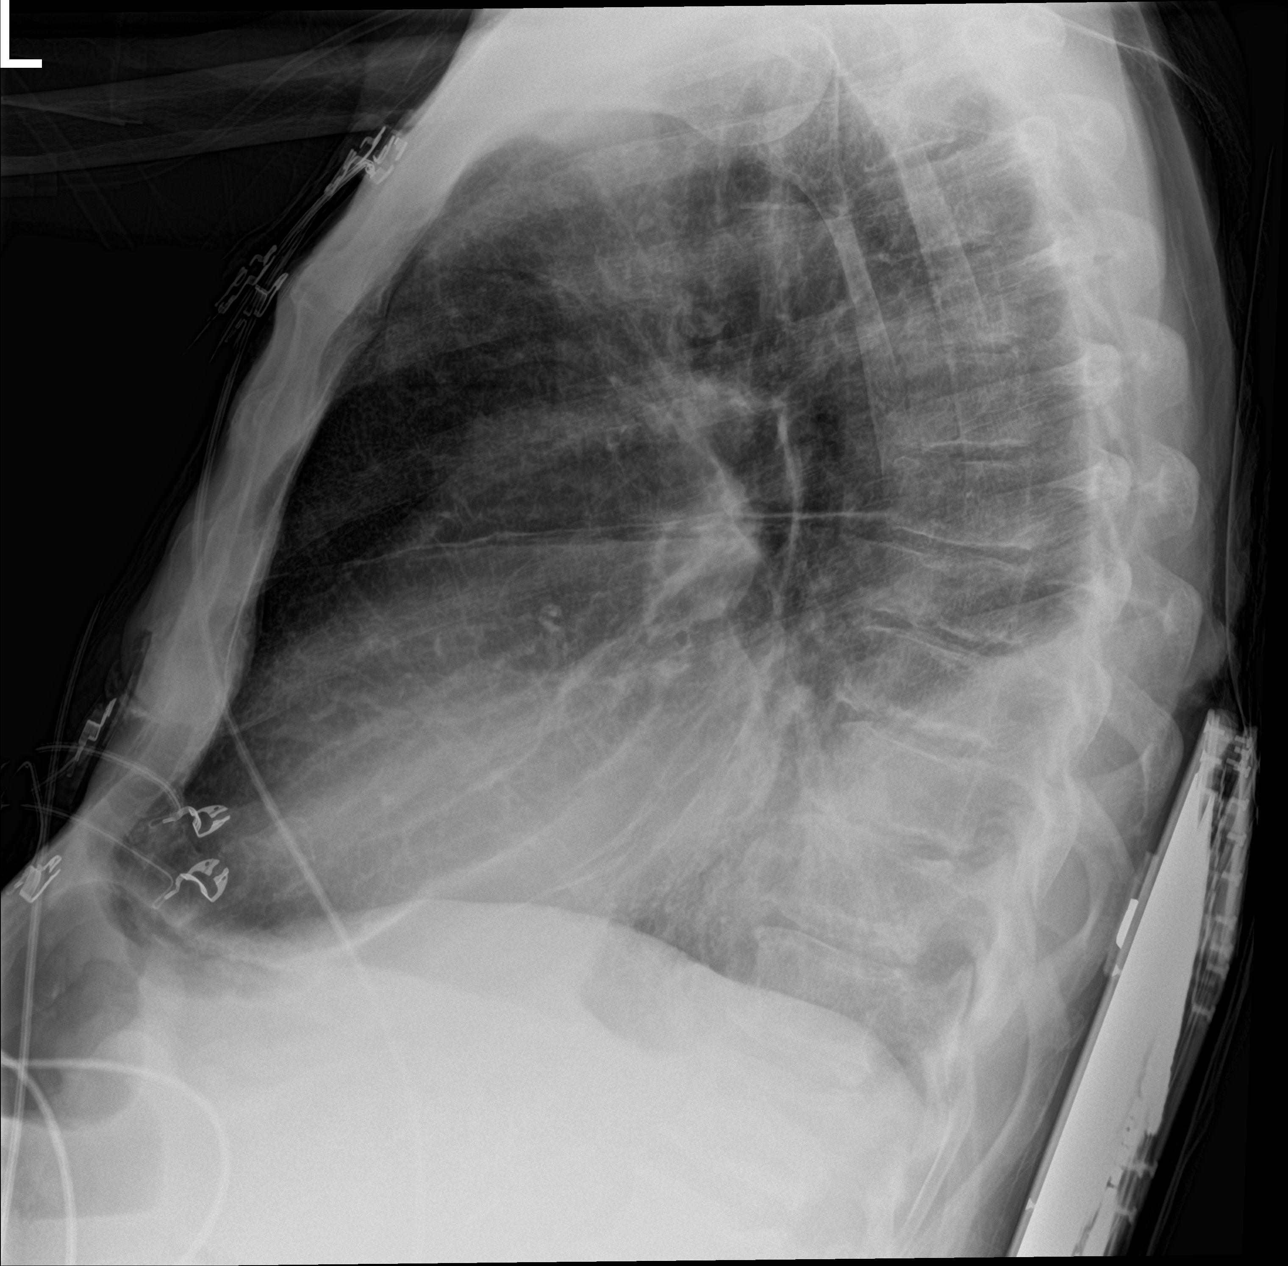

[chest ap (1 of 2)]
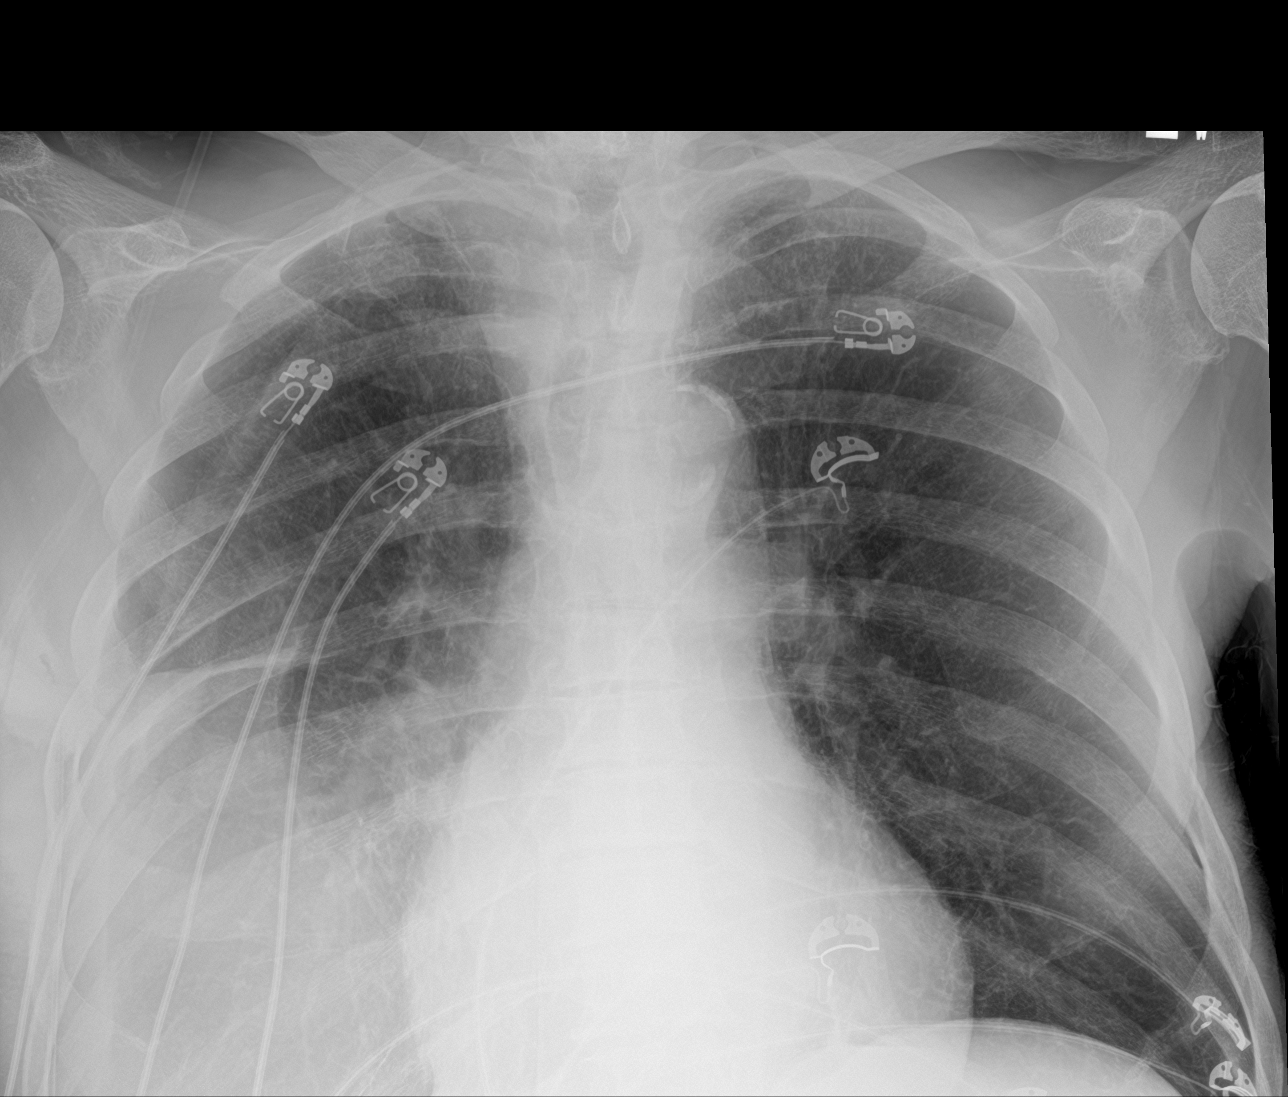

[chest ap (2 of 2)]
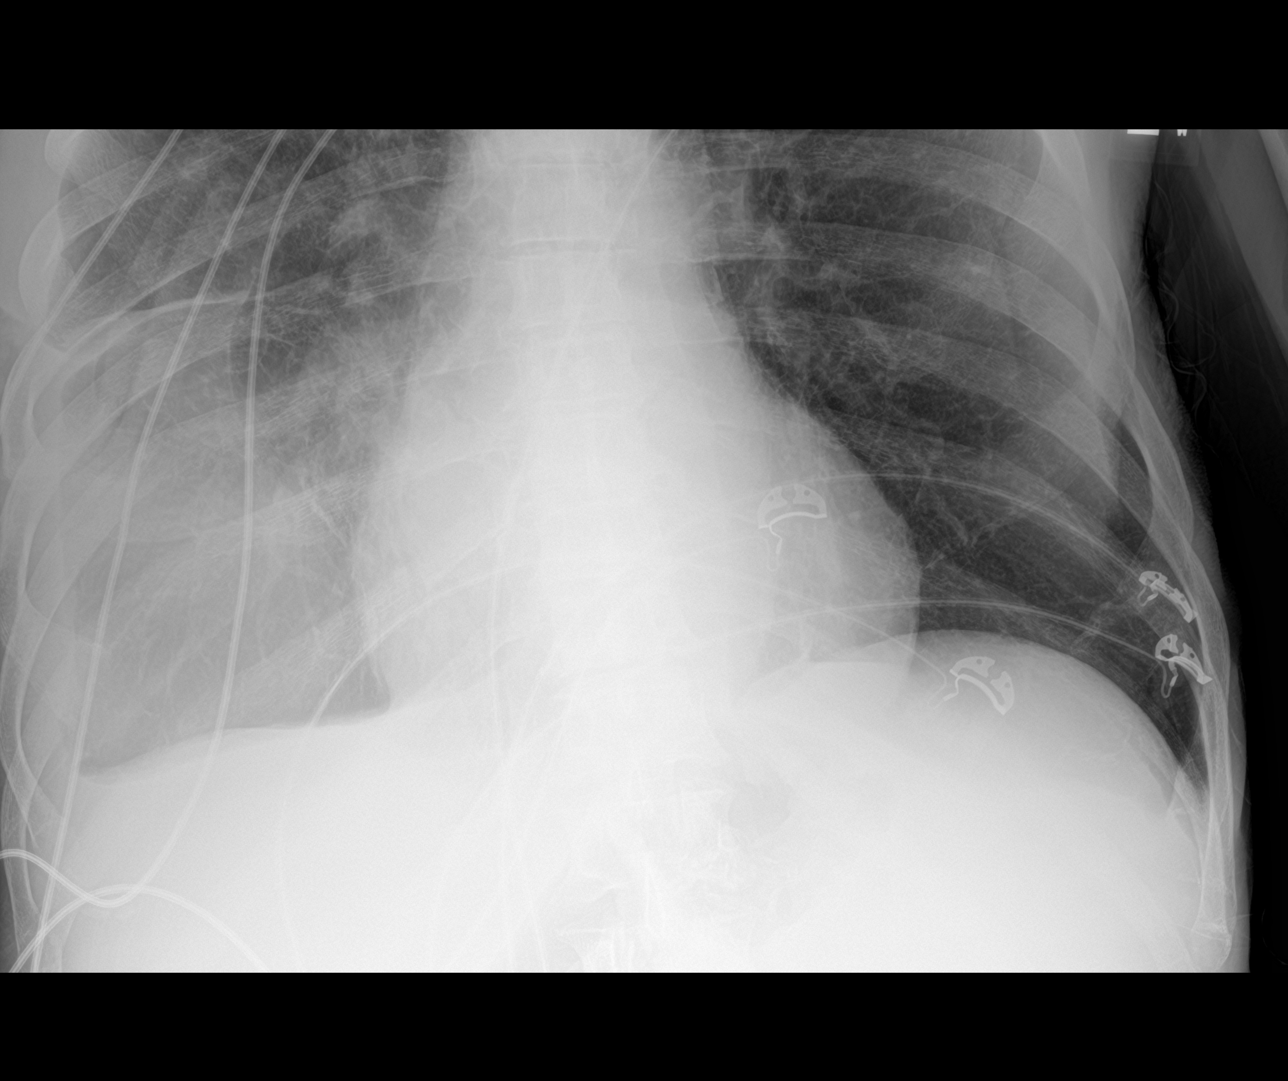

[3 of 3 positions shown; findings below may reference images not displayed]

FINDINGS: Moderate right pleural effusion. No left pleural effusion. No focal
consolidation. No pneumothorax. Stable cardiomediastinal silhouette.
No acute osseous abnormality.
IMPRESSION: Moderate right pleural effusion.
